# Patient Record
Sex: Female | Born: 1950
Health system: Southern US, Community
[De-identification: ages and names within clinical notes are randomized; demographics above are authoritative.]

## PROBLEM LIST (undated history)

## (undated) DIAGNOSIS — T8859XA Other complications of anesthesia, initial encounter: Secondary | ICD-10-CM

## (undated) DIAGNOSIS — Z923 Personal history of irradiation: Secondary | ICD-10-CM

## (undated) DIAGNOSIS — E7800 Pure hypercholesterolemia, unspecified: Secondary | ICD-10-CM

## (undated) DIAGNOSIS — K589 Irritable bowel syndrome without diarrhea: Secondary | ICD-10-CM

## (undated) DIAGNOSIS — M8580 Other specified disorders of bone density and structure, unspecified site: Secondary | ICD-10-CM

## (undated) DIAGNOSIS — Z9889 Other specified postprocedural states: Secondary | ICD-10-CM

## (undated) DIAGNOSIS — I10 Essential (primary) hypertension: Secondary | ICD-10-CM

## (undated) DIAGNOSIS — R0600 Dyspnea, unspecified: Secondary | ICD-10-CM

## (undated) DIAGNOSIS — S92919A Unspecified fracture of unspecified toe(s), initial encounter for closed fracture: Secondary | ICD-10-CM

## (undated) DIAGNOSIS — G709 Myoneural disorder, unspecified: Secondary | ICD-10-CM

## (undated) DIAGNOSIS — K219 Gastro-esophageal reflux disease without esophagitis: Secondary | ICD-10-CM

## (undated) DIAGNOSIS — M810 Age-related osteoporosis without current pathological fracture: Secondary | ICD-10-CM

## (undated) DIAGNOSIS — Z932 Ileostomy status: Secondary | ICD-10-CM

## (undated) DIAGNOSIS — D649 Anemia, unspecified: Secondary | ICD-10-CM

## (undated) DIAGNOSIS — R112 Nausea with vomiting, unspecified: Secondary | ICD-10-CM

## (undated) HISTORY — DX: Essential (primary) hypertension: I10

## (undated) HISTORY — DX: Age-related osteoporosis without current pathological fracture: M81.0

## (undated) HISTORY — DX: Other specified disorders of bone density and structure, unspecified site: M85.80

## (undated) HISTORY — DX: Irritable bowel syndrome, unspecified: K58.9

## (undated) HISTORY — DX: Pure hypercholesterolemia, unspecified: E78.00

## (undated) HISTORY — DX: Personal history of irradiation: Z92.3

## (undated) HISTORY — PX: TONSILLECTOMY: SUR1361

---

## 1988-04-11 HISTORY — PX: TUBAL LIGATION: SHX77

## 1993-04-11 HISTORY — PX: OTHER SURGICAL HISTORY: SHX169

## 2004-08-09 LAB — CONVERTED CEMR LAB

## 2006-01-11 ENCOUNTER — Encounter: Payer: Self-pay | Admitting: Family Medicine

## 2006-01-11 LAB — CONVERTED CEMR LAB
Cholesterol: 255 mg/dL
HDL: 98 mg/dL
Triglycerides: 64 mg/dL

## 2007-05-21 ENCOUNTER — Ambulatory Visit: Payer: Self-pay | Admitting: Family Medicine

## 2007-05-21 ENCOUNTER — Encounter: Admission: RE | Admit: 2007-05-21 | Discharge: 2007-05-21 | Payer: Self-pay | Admitting: Family Medicine

## 2007-05-21 DIAGNOSIS — M79609 Pain in unspecified limb: Secondary | ICD-10-CM

## 2007-05-21 DIAGNOSIS — K589 Irritable bowel syndrome without diarrhea: Secondary | ICD-10-CM

## 2007-05-21 DIAGNOSIS — E7800 Pure hypercholesterolemia, unspecified: Secondary | ICD-10-CM

## 2007-05-22 DIAGNOSIS — M949 Disorder of cartilage, unspecified: Secondary | ICD-10-CM

## 2007-05-22 DIAGNOSIS — M899 Disorder of bone, unspecified: Secondary | ICD-10-CM | POA: Insufficient documentation

## 2007-05-28 ENCOUNTER — Encounter: Payer: Self-pay | Admitting: Family Medicine

## 2007-06-04 ENCOUNTER — Encounter: Payer: Self-pay | Admitting: Family Medicine

## 2007-06-04 ENCOUNTER — Encounter: Admission: RE | Admit: 2007-06-04 | Discharge: 2007-06-04 | Payer: Self-pay | Admitting: Family Medicine

## 2007-06-05 ENCOUNTER — Encounter: Payer: Self-pay | Admitting: Family Medicine

## 2007-06-05 LAB — CONVERTED CEMR LAB
ALT: 15 units/L (ref 0–35)
AST: 18 units/L (ref 0–37)
CO2: 26 meq/L (ref 19–32)
Cholesterol: 269 mg/dL — ABNORMAL HIGH (ref 0–200)
Creatinine, Ser: 0.75 mg/dL (ref 0.40–1.20)
LDL Goal: 160 mg/dL
Total Bilirubin: 0.7 mg/dL (ref 0.3–1.2)
Total CHOL/HDL Ratio: 2.6
VLDL: 10 mg/dL (ref 0–40)

## 2007-06-07 ENCOUNTER — Telehealth: Payer: Self-pay | Admitting: Family Medicine

## 2007-06-12 ENCOUNTER — Encounter: Payer: Self-pay | Admitting: Family Medicine

## 2007-10-03 ENCOUNTER — Ambulatory Visit: Payer: Self-pay | Admitting: Obstetrics & Gynecology

## 2007-10-03 ENCOUNTER — Encounter: Payer: Self-pay | Admitting: Obstetrics & Gynecology

## 2007-10-04 ENCOUNTER — Encounter: Payer: Self-pay | Admitting: Family Medicine

## 2007-10-24 ENCOUNTER — Encounter: Payer: Self-pay | Admitting: Family Medicine

## 2007-11-13 DIAGNOSIS — R748 Abnormal levels of other serum enzymes: Secondary | ICD-10-CM

## 2007-11-13 LAB — CONVERTED CEMR LAB
Albumin: 4.8 g/dL (ref 3.5–5.2)
Alkaline Phosphatase: 54 units/L (ref 39–117)
HDL: 118 mg/dL (ref >39)
LDL Cholesterol: 101 mg/dL — ABNORMAL HIGH (ref 0–99)
Total Bilirubin: 0.9 mg/dL (ref 0.3–1.2)
VLDL: 23 mg/dL (ref 0–40)

## 2007-11-14 ENCOUNTER — Encounter: Payer: Self-pay | Admitting: Family Medicine

## 2007-11-15 LAB — CONVERTED CEMR LAB
Alkaline Phosphatase: 53 units/L (ref 39–117)
Bilirubin, Direct: 0.1 mg/dL (ref 0.0–0.3)
Indirect Bilirubin: 0.3 mg/dL (ref 0.0–0.9)
Total Bilirubin: 0.4 mg/dL (ref 0.3–1.2)
Total Protein: 6.8 g/dL (ref 6.0–8.3)

## 2007-12-24 ENCOUNTER — Encounter: Admission: RE | Admit: 2007-12-24 | Discharge: 2007-12-24 | Payer: Self-pay | Admitting: Obstetrics & Gynecology

## 2008-04-22 ENCOUNTER — Ambulatory Visit: Payer: Self-pay | Admitting: Family Medicine

## 2008-04-22 DIAGNOSIS — D239 Other benign neoplasm of skin, unspecified: Secondary | ICD-10-CM | POA: Insufficient documentation

## 2008-04-29 ENCOUNTER — Telehealth: Payer: Self-pay | Admitting: Family Medicine

## 2008-09-01 ENCOUNTER — Ambulatory Visit: Payer: Self-pay | Admitting: Family Medicine

## 2008-12-31 ENCOUNTER — Telehealth: Payer: Self-pay | Admitting: Family Medicine

## 2009-01-06 ENCOUNTER — Encounter: Admission: RE | Admit: 2009-01-06 | Discharge: 2009-01-06 | Payer: Self-pay | Admitting: Family Medicine

## 2009-01-15 ENCOUNTER — Ambulatory Visit: Payer: Self-pay | Admitting: Family Medicine

## 2009-08-12 ENCOUNTER — Ambulatory Visit: Payer: Self-pay | Admitting: Family Medicine

## 2009-08-13 ENCOUNTER — Encounter: Payer: Self-pay | Admitting: Family Medicine

## 2009-08-13 LAB — CONVERTED CEMR LAB
Albumin: 4.8 g/dL (ref 3.5–5.2)
BUN: 25 mg/dL — ABNORMAL HIGH (ref 6–23)
CO2: 23 meq/L (ref 19–32)
Calcium: 9.6 mg/dL (ref 8.4–10.5)
Chloride: 105 meq/L (ref 96–112)
Glucose, Bld: 95 mg/dL (ref 70–99)
HDL: 98 mg/dL (ref >39)
Potassium: 4.3 meq/L (ref 3.5–5.3)
Total Protein: 6.7 g/dL (ref 6.0–8.3)
Triglycerides: 63 mg/dL (ref <150)

## 2009-08-25 ENCOUNTER — Encounter: Payer: Self-pay | Admitting: Cardiology

## 2009-08-25 ENCOUNTER — Ambulatory Visit: Payer: Self-pay | Admitting: Cardiology

## 2009-08-25 DIAGNOSIS — R002 Palpitations: Secondary | ICD-10-CM

## 2009-08-25 DIAGNOSIS — R001 Bradycardia, unspecified: Secondary | ICD-10-CM

## 2009-08-25 DIAGNOSIS — R0602 Shortness of breath: Secondary | ICD-10-CM | POA: Insufficient documentation

## 2009-09-24 ENCOUNTER — Ambulatory Visit: Payer: Self-pay | Admitting: Family Medicine

## 2009-09-24 DIAGNOSIS — J309 Allergic rhinitis, unspecified: Secondary | ICD-10-CM | POA: Insufficient documentation

## 2009-09-28 ENCOUNTER — Telehealth: Payer: Self-pay | Admitting: Family Medicine

## 2009-09-30 ENCOUNTER — Ambulatory Visit: Payer: Self-pay

## 2009-09-30 ENCOUNTER — Encounter: Payer: Self-pay | Admitting: Cardiology

## 2009-09-30 ENCOUNTER — Ambulatory Visit (HOSPITAL_COMMUNITY): Admission: RE | Admit: 2009-09-30 | Discharge: 2009-09-30 | Payer: Self-pay | Admitting: Cardiology

## 2009-09-30 ENCOUNTER — Ambulatory Visit: Payer: Self-pay | Admitting: Cardiology

## 2009-12-15 ENCOUNTER — Ambulatory Visit: Payer: Self-pay | Admitting: Obstetrics & Gynecology

## 2009-12-24 ENCOUNTER — Ambulatory Visit: Payer: Self-pay | Admitting: Family Medicine

## 2010-01-19 ENCOUNTER — Encounter: Admission: RE | Admit: 2010-01-19 | Discharge: 2010-01-19 | Payer: Self-pay | Admitting: Obstetrics & Gynecology

## 2010-01-28 ENCOUNTER — Ambulatory Visit: Payer: Self-pay | Admitting: Family Medicine

## 2010-05-11 NOTE — Assessment & Plan Note (Signed)
Summary: shingles vaccine  Nurse Visit   Allergies: 1)  ! Lescol  Immunizations Administered:  Zostavax # 1:    Vaccine Type: Zostavax    Site: left deltoid    Mfr: Merck    Dose: 0.5 ml    Route: IM    Given by: Sue Lush McCrimmon CMA, (AAMA)    Exp. Date: 11/13/2010    Lot #: [id number]    VIS given: 01/21/05 given December 24, 2009.  Orders Added: 1)  Zoster (Shingles) Vaccine Live [90736] 2)  Admin 1st Vaccine [id number]

## 2010-05-11 NOTE — Progress Notes (Signed)
Summary: Needs Nasonex rx  Phone Note Call from Patient   Summary of Call: pt given sample of the Nasonex last week at [facility] and told to call if working. It is and would [facility] Initial call taken by: Kathlene November,  September 28, 2009 9:42 AM    New/Updated Medications: NASONEX 50 MCG/ACT SUSP (MOMETASONE FUROATE) 2 sprays per nostril daily Prescriptions: NASONEX 50 MCG/ACT SUSP (MOMETASONE FUROATE) 2 sprays per nostril daily  #1 bottle x 2   Entered and Authorized by:   Seymour Bars DO   Signed by:   Seymour Bars DO on 09/28/2009   Method used:   [facility] to        [phone][facility] (retail)       [address]       [place], [place]  [address]       Ph: [phone] or [phone]       Fax: [phone]   RxID:   [id number]

## 2010-05-11 NOTE — Assessment & Plan Note (Signed)
Summary: SINUS INF//VGJ   Vital Signs:  Patient profile:   60 year old female Height:      64 inches Weight:      129 pounds BMI:     22.22 O2 Sat:      100 % on Room air Temp:     98.1 degrees F oral Pulse rate:   61 / minute BP sitting:   133 / 75  (left arm) Cuff size:   regular  Vitals Entered By: Payton Spark CMA (September 24, 2009 8:51 AM)  O2 Flow:  Room air CC: Sinus infection x 3 weeks. C/O sinus HA and pressure.   History of Present Illness: 60 yo woman here today for ? sinus infxn.  3 weeks ago had URI and sxs persist.  pain w/ moving head/leaning forward.  HA is intermittant.  no facial pain, + mild pressure over maxillary sinuses.  no fevers.  no ear pain.  only intermittant nasal congestion.  pt c/o 'nasal voice'.  + sick contacts  Current Medications (verified): 1)  Adult Aspirin Ec Low Strength 81 Mg  Tbec (Aspirin) .... Take One Tablet By Mouth Once A Day 2)  Simvastatin 40 Mg  Tabs (Simvastatin) .... Take 1 Tablet By Mouth Once A Day 3)  Fish Oil Maximum Strength 1200 Mg Caps (Omega-3 Fatty Acids) .... Take 2 Capsules Two Times A Day 4)  Calcium Carbonate-Vitamin D 600-400 Mg-Unit  Tabs (Calcium Carbonate-Vitamin D) .... Take 1 Tablet By Mouth Two Times A Day  Allergies (verified): 1)  ! Lescol  Past History:  Past Medical History: Last updated: 08/25/2009 HYPERCHOLESTEROLEMIA (ICD-272.0) OSTEOPENIA (ICD-733.90) IBS (ICD-564.1)  Review of Systems      See HPI  Physical Exam  General:  Well-developed,well-nourished,in no acute distress; alert,appropriate and cooperative throughout examination Head:  Normocephalic and atraumatic without obvious abnormalities. No apparent alopecia or balding.  no TTP over sinuses Eyes:  no injxn or inflammation Ears:  TMs retracted bilaterally Nose:  + congestion, turbinate edema Mouth:  Oral mucosa and oropharynx without lesions or exudates.  Teeth in good repair.  + PND Neck:  No deformities, masses, or tenderness  noted. Lungs:  Normal respiratory effort, chest expands symmetrically. Lungs are clear to auscultation, no crackles or wheezes. Heart:  Normal rate and regular rhythm. S1 and S2 normal without gallop, murmur, click, rub or other extra sounds.   Impression & Recommendations:  Problem # 1:  RHINITIS (ICD-477.9) Assessment New pt w/out evidence of sinus infxn on exam.  likely resolving URI w/ allergy component given congestion and turbinate edema.  continue OTC decongestant and start nasal steroid spray.  reviewed supportive care and red flags that should prompt return.  Pt expresses understanding and is in agreement w/ this plan.  Complete Medication List: 1)  Adult Aspirin Ec Low Strength 81 Mg Tbec (Aspirin) .... Take one tablet by mouth once a day 2)  Simvastatin 40 Mg Tabs (Simvastatin) .... Take 1 tablet by mouth once a day 3)  Fish Oil Maximum Strength 1200 Mg Caps (Omega-3 fatty acids) .... Take 2 capsules two times a day 4)  Calcium Carbonate-vitamin D 600-400 Mg-unit Tabs (Calcium carbonate-vitamin d) .... Take 1 tablet by mouth two times a day  Patient Instructions: 1)  Follow up as needed 2)  This appears to be a viral/allergy combo and should continue to improve 3)  Use Advil Cold and Sinus as needed (like you have been) but no more than 3-5 days 4)  Tylenol/Ibuprofen as needed  for headache 5)  Use the nasal spray- 2 sprays each nostril daily while not feeling well 6)  Call with any questions or concerns 7)  Have a great trip!

## 2010-05-11 NOTE — Assessment & Plan Note (Signed)
Summary: CPE   Vital Signs:  Patient profile:   60 year old female Height:      64 inches Weight:      129 pounds BMI:     22.22 Pulse rate:   59 / minute BP sitting:   124 / 73  (left arm) Cuff size:   regular  Vitals Entered By: Kathlene November (Aug 12, 2009 9:20 AM) CC: CPE no pap   CC:  CPE no pap.  Current Medications (verified): 1)  Adult Aspirin Ec Low Strength 81 Mg  Tbec (Aspirin) .... Take One Tablet By Mouth Once A Day 2)  Simvastatin 40 Mg  Tabs (Simvastatin) .... Take 1 Tablet By Mouth Once A Day  Allergies (verified): 1)  ! Lescol  Comments:  Nurse/Medical Assistant: The patient's medications and allergies were reviewed with the patient and were updated in the Medication and Allergy Lists. Kathlene November (Aug 12, 2009 9:20 AM)  Past History:  Past Medical History: Last updated: 05/21/2007 Was on Selnorm for IBS  Past Surgical History: Last updated: 05/21/2007 Lump removed 1995  Family History: Last updated: 08/12/2009 cousin with BrCa Fathe wiht MI, DM, Hi chol, HTN Mother with stroke, hi chol, HTN, pacemaker.   Social History: Last updated: 08/12/2009 Unemployed.  [place].  Widowed to [employer] with adult chld.  Takes care of her father who lives in the home.   Former Smoker - Quit 1988 Alcohol use-yes Drug use-no Regular exercise-yes  Family History: cousin with BrCa F[place] wiht MI, DM, Hi chol, HTN Mother with stroke, hi chol, HTN, pacemaker.   Social History: Unemployed.  [employer] [place].  Widowed to [employer] with adult chld.  Takes care of her father who lives in the home.   Former Smoker - Quit 1988 Alcohol use-yes Drug use-no Regular exercise-yes  Review of Systems  The patient denies anorexia, fever, weight loss, weight gain, vision loss, decreased hearing, hoarseness, chest pain, syncope, dyspnea on exertion, peripheral edema, prolonged cough, headaches, hemoptysis, abdominal pain, melena, hematochezia, severe  indigestion/heartburn, hematuria, incontinence, genital sores, muscle weakness, suspicious skin lesions, transient blindness, difficulty walking, depression, unusual weight change, abnormal bleeding, enlarged lymph nodes, and breast masses.    Physical Exam  General:  Well-developed,well-nourished,in no acute distress; alert,appropriate and cooperative throughout examination Head:  Normocephalic and atraumatic without obvious abnormalities. No apparent alopecia or balding. Eyes:  No corneal or conjunctival inflammation noted. EOMI. Perrla.  Ears:  External ear exam shows no significant lesions or deformities.  Otoscopic examination reveals clear canals, tympanic membranes are intact bilaterally without bulging, retraction, inflammation or discharge. Hearing is grossly normal bilaterally. Nose:  External nasal examination shows no deformity or inflammation. Nasal mucosa are pink and moist without lesions or exudates. Mouth:  Oral mucosa and oropharynx without lesions or exudates.  Teeth in good repair. Neck:  No deformities, masses, or tenderness noted. Chest Wall:  No deformities, masses, or tenderness noted. Breasts:  No mass, nodules, thickening, tenderness, bulging, retraction, inflamation, nipple discharge or skin changes noted.   Lungs:  Normal respiratory effort, chest expands symmetrically. Lungs are clear to auscultation, no crackles or wheezes. Heart:  Normal rate and regular rhythm. S1 and S2 normal without gallop, murmur, click, rub or other extra sounds. Abdomen:  Bowel sounds positive,abdomen soft and non-tender without masses, organomegaly or hernias noted. Msk:  No deformity or scoliosis noted of thoracic or lumbar spine.   Pulses:  R and L carotid,radial,dorsalis pedis and posterior tibial pulses are full and  equal bilaterally Extremities:  No clubbing, cyanosis, edema, or deformity noted with normal full range of motion of all joints.   Neurologic:  No cranial nerve deficits  noted. Station and gait are normal. Plantar reflexes are down-going bilaterally. DTRs are symmetrical throughout. Sensory, motor and coordinative functions appear intact. Skin:  no rashes.   Cervical Nodes:  No lymphadenopathy noted Axillary Nodes:  No palpable lymphadenopathy Psych:  Cognition and judgment appear intact. Alert and cooperative with normal attention span and concentration. No apparent delusions, illusions, hallucinations   Impression & Recommendations:  Problem # 1:  ROUTINE GYNECOLOGICAL EXAMINATION (ICD-V72.31)  Examis normal. Mammo is up to date.  Pt prefers to do stool cards for colon Ca screening Encourage daily calcium and vitamin D in her diet.   Due for screening labs.  Due for her pap this year.  EKG with rate of 53 (bradycardic) with NSR. No acute changes.  Orders: T-Comprehensive Metabolic Panel [phone] T-Lipid Profile [phone]) T-TSH [phone]) Cardiology Referral (Cardiology) EKG w/ Interpretation (93000)  Complete Medication List: 1)  Adult Aspirin Ec Low Strength 81 Mg Tbec (Aspirin) .... Take one tablet by mouth once a day 2)  Simvastatin 40 Mg Tabs (Simvastatin) .... Take 1 tablet by mouth once a day  Patient Instructions: 1)  Keep up the good work on exercising!  2)  Take calcium +vitamin D daily.  3)  Can drop off your stool cards anytime.  4)  We will call you with your lab results in a couple of days.  5)  We will call you with your cardiology referral with Dr. Jens Som.  6)  Also think about the shingles vaccines. We should get our doses in in July.  7)  It is important to use your inhaler properly. Use a spacer, take slow deep breaths and hold them. Rinse your mouth after using.   Flex Sig Next Due:  Refused Colonoscopy Next Due:  Refused Last PAP:  Unknown (08/09/2004 2:18:30 PM) PAP Result Date:  04/11/2006 PAP Result:  normal PAP Next Due:  3 yr  Appended Document: CPE  Laboratory Results  Date/Time Received:  08/18/2009 Date/Time Reported: 08/18/2009  Stool - Occult Blood Hemmoccult #1: negative Hemoccult #2: negative Hemoccult #3: negative

## 2010-05-11 NOTE — Assessment & Plan Note (Signed)
Summary: [facility]   Visit Type:  Initial Consult Primary Provider:  Linford Arnold, C  CC:  palpitations.  History of Present Illness: 60 year old female for evaluation of palpitations, dyspnea and bradycardia. The patient has no prior cardiac history. She does have occasional palpitations. They are described as a brief flutter and are not sustained. There is no associated presyncope or syncope. She also occasionally has dyspnea with more extreme activities which is relieved with rest. There is no associated chest pain. There is no orthopnea, PND or pedal edema. She does not have exertional chest pain. She does have a family history of coronary disease and was concerned; we were therefore asked to further evaluate.  Current Medications (verified): 1)  Adult Aspirin Ec Low Strength 81 Mg  Tbec (Aspirin) .... Take One Tablet By Mouth Once A Day 2)  Simvastatin 40 Mg  Tabs (Simvastatin) .... Take 1 Tablet By Mouth Once A Day 3)  Fish Oil Maximum Strength 1200 Mg Caps (Omega-3 Fatty Acids) .... Take 2 Capsules Two Times A Day 4)  Calcium Carbonate-Vitamin D 600-400 Mg-Unit  Tabs (Calcium Carbonate-Vitamin D) .... Take 1 Tablet By Mouth Two Times A Day  Allergies: 1)  ! Lescol  Past History:  Past Medical History: HYPERCHOLESTEROLEMIA (ICD-272.0) OSTEOPENIA (ICD-733.90) IBS (ICD-564.1)  Past Surgical History: Lump removed 1995 Tonsillectomy  Family History: Reviewed history from 08/12/2009 and no changes required. cousin with BrCa Father with MI at age 41; DM, Hi chol, HTN Mother with stroke, hi chol, HTN, pacemaker.   Social History: Reviewed history from 08/12/2009 and no changes required. Retired.  [employer].  Widowed to [employer] with adult chld. Takes care of her father who lives in the home.   Former Smoker - Quit 1988 Alcohol use-yes Drug use-no Regular exercise-yes  Review of Systems       no fevers or chills, productive cough, hemoptysis, dysphasia,  odynophagia, melena, hematochezia, dysuria, hematuria, rash, seizure activity, orthopnea, PND, pedal edema, claudication. Remaining systems are negative.   Vital Signs:  Patient profile:   60 year old female Height:      64 inches Weight:      127.75 pounds BMI:     22.01 Pulse rate:   67 / minute Pulse rhythm:   regular Resp:     18 per minute BP sitting:   148 / 70  (right arm) Cuff size:   large  Vitals Entered By: Vikki Ports (Aug 25, 2009 2:15 PM)  Physical Exam  General:  Well developed/well nourished in NAD Skin warm/dry Patient not depressed No peripheral clubbing Back-normal HEENT-normal/normal eyelids Neck supple/normal carotid upstroke bilaterally; no bruits; no JVD; no thyromegaly chest - CTA/ normal expansion CV - RRR/normal S1 and S2; no murmurs, rubs or gallops;  PMI nondisplaced Abdomen -NT/ND, no HSM, no mass, + bowel sounds, no bruit 2+ femoral pulses, no bruits Ext-no edema, chords, 2+ DP Neuro-grossly nonfocal     EKG  Procedure date:  08/12/2009  Findings:      Sinus bradycardia at a rate of 53. Axis normal.  Impression & Recommendations:  Problem # 1:  DYSPNEA (ICD-786.05) Not volume overloaded on examination. Electrocardiogram normal. Given her risk factors we'll plan stress echocardiogram. Continue risk factor modification. Her updated medication list for this problem includes:    Adult Aspirin Ec Low Strength 81 Mg Tbec (Aspirin) .Marland Kitchen... Take one tablet by mouth once a day  Orders: Stress Echo (Stress Echo)  Problem # 2:  HYPERCHOLESTEROLEMIA (ICD-272.0) Continue statin; lipids and  liver followed by primary care. Her updated medication list for this problem includes:    Simvastatin 40 Mg Tabs (Simvastatin) .Marland Kitchen... Take 1 tablet by mouth once a day  Problem # 3:  IBS (ICD-564.1)  Problem # 4:  BRADYCARDIA (ICD-427.89) Asymptomatic; no further evaluation. Her updated medication list for this problem includes:    Adult Aspirin Ec Low  Strength 81 Mg Tbec (Aspirin) .Marland Kitchen... Take one tablet by mouth once a day  Problem # 5:  PALPITATIONS (ICD-785.1) Most likely due to PVCs based on description; not significantly bothersome; plan monitor in the future if symptoms worsen. Her updated medication list for this problem includes:    Adult Aspirin Ec Low Strength 81 Mg Tbec (Aspirin) .Marland Kitchen... Take one tablet by mouth once a day  Patient Instructions: 1)  Your physician recommends that you schedule a follow-up appointment in: AS NEEDED PENDING TEST RESULTS 2)  Your physician has requested that you have a stress echocardiogram. For further information please visit [email or web address]  Please follow instruction sheet as given.

## 2010-05-11 NOTE — Assessment & Plan Note (Signed)
Summary: flu shot   Nurse Visit   Allergies: 1)  ! Lescol  Immunizations Administered:  Influenza Vaccine # 1:    Vaccine Type: Fluvax 3+    Site: left deltoid    Mfr: GlaxoSmithKline    Dose: 0.5 ml    Route: IM    Given by: Sue Lush McCrimmon CMA, (AAMA)    Exp. Date: 10/09/2010    Lot #: [id number]    VIS given: 11/03/09 version given January 28, 2010.  Flu Vaccine Consent Questions:    Do you have a history of severe allergic reactions to this vaccine? no    Any prior history of allergic reactions to egg and/or gelatin? no    Do you have a sensitivity to the preservative Thimersol? no    Do you have a past history of Guillan-Barre Syndrome? no    Do you currently have an acute febrile illness? no    Have you ever had a severe reaction to latex? no    Vaccine information given and explained to patient? no    Are you currently pregnant? no  Orders Added: 1)  Flu Vaccine 27yrs + [90658] 2)  Admin 1st Vaccine [[id number]]

## 2010-08-03 ENCOUNTER — Other Ambulatory Visit: Payer: Self-pay | Admitting: Family Medicine

## 2010-08-24 NOTE — Assessment & Plan Note (Signed)
NAME:  Maria Romero, Maria Romero NO.:  [id number]   MEDICAL RECORD NO.:  [id number]          PATIENT TYPE:  POB   LOCATION:  [facility] [place]         FACILITY[facility]  [facility]   PHYSICIAN:  Elsie Lincoln, MD      DATE OF BIRTH:  June 11, 1950   DATE OF SERVICE:                                  CLINIC NOTE   HISTORY OF PRESENT ILLNESS:  The patient is a 60 year old G1, P56 female  who has been menopausal since age 51, who presents for yearly exam.  It  has been 4-5 years until last Pap smear.  She recently moved to the area  from [place].  The patient has had no postmenopausal bleeding since  age 21.  She had an occasional hot flash, but not bothersome.  She has  no complaints today.   PAST MEDICAL HISTORY:  Increased cholesterol.   PAST SURGICAL HISTORY:  Bilateral tubal ligation.   OB HISTORY:  NSVD x1.   GYN HISTORY:  Abnormal Pap once in her 30s, and once in her 40s, and all  that was required was repeat surveillance.  She never had a colposcopy,  freezing or burning of her cervix.  She had a right cyst many years ago,  that was benign and did not have any surgery.   HEALTH MAINTENANCE:  The patient is due for a mammogram in early  September, and we will order this.  The patient has never had a  colonoscopy.  I do believe she needs one.  We gave her 3 stool kits to  send home.  She does a power walk and takes some calcium, but do not  think this is enough.  We gave her a worksheet to fill out to see if she  needs to increase her calcium intake.   FAMILY HISTORY:  Positive for diabetes, heart attack, high blood  pressure.  There is no familial cancers.   SOCIAL HISTORY:  The patient drinks 4 alcoholic beverages a week and  drinks 2 cups of coffee a day.  No drugs or history of abuse.  Systemic  review is positive for muscle aches that she attributes to picking up  her 24-year-old granddaughter.   MEDICATIONS:  1. Zocor.  2. Lovaza.  3. Caltrate 600 D.  4. Bayer  Aspirin.   ALLERGIES:  None.   PHYSICAL EXAMINATION:  VITALS:  Pulse 64, blood pressure 100/60, weight  133, height 63-3/4 inches.  GENERAL:  Well nourished, well developed, in no apparent distress.  HEENT:  Normocephalic, atraumatic.  Good dentition.  The patient does  need to see her dentist as she has not had a checkup in several years.  NECK:  Thyroid, no masses.  LUNGS:  Clear to auscultation bilaterally.  HEART:  Regular rate and rhythm.  BREASTS:  Symmetric.  No dimpling.  No discharge.  No lymphadenopathy.  No masses.  ABDOMEN:  Soft, nontender, and nondistended.  No rebound.  No guarding.  No organomegaly.  No hernia.  Well-healed incision at the umbilicus from  tubal.  GENITALIA:  Tanner V.  Vagina pink.  Normal rugae.  Cervix closed and  nontender.  Uterus anteverted and nontender.  Adnexa, no  masses and  nontender.  Rectovaginal, no masses and nontender.  Stool looked  positive for blood.  Stool kits were sent home.  The patient was  encouraged to get colonoscopy.  EXTREMITIES:  Nontender.  No edema.   ASSESSMENT AND PLAN:  A 61 year old female with yearly exam:  1. Pap smear.  2. Mammogram ordered for September.  3. The patient needs a colonoscopy, but only agrees to the stool cards      at this time.  4. Return to clinic in a year for a repeat Pap smear.           ______________________________  Elsie Lincoln, MD     KL/MEDQ  D:  10/03/2007  T:  10/04/2007  Job:  [id number]

## 2010-08-24 NOTE — Assessment & Plan Note (Signed)
Maria Romero, Maria Romero NO.:  [id number]   MEDICAL RECORD NO.:  [id number]          PATIENT TYPE:  POB   LOCATION:  [facility] [place]         FACILITY:  [facility]   PHYSICIAN:  Allie Bossier, MD        DATE OF BIRTH:  04-13-1950   DATE OF SERVICE:  12/15/2009                                  CLINIC NOTE   Ms. Scharnhorst is a 60 year old married white G1, P1.  She has a 34-year-  old daughter and a 5-year granddaughter.  She comes in here for her  annual exam.  Her complaints are decreased libido and vaginal dryness.  She is not sexually active currently, but may become sexually active in  the future.   PAST MEDICAL HISTORY:  Elevated cholesterol.  She sees Dr. Linford Arnold for  this.   REVIEW OF SYSTEMS:  She is a retired Research scientist (medical).  The remainder of  review of systems questions are negative.  Her mammogram is due next  month and she will call and schedule it.  She gets annual Hemoccult  testing with Dr. Linford Arnold.   FAMILY HISTORY:  First cousin had breast cancer.  No family history of  GYN or colon cancers.   PAST SURGICAL HISTORY:  Tonsillectomy and tubal ligation.   SOCIAL HISTORY:  She quit smoking 20 years ago.  She has wine in the  evenings.   ALLERGIES:  No known drug allergies.  No latex allergies.   MEDICATIONS:  1. Zocor 40 mg daily.  2. Calcium with D daily.  3. Baby aspirin daily.  4. Fish oil daily.   PHYSICAL EXAMINATION:  GENERAL:  Well-nourished, well-hydrated very  pleasant white female.  VITAL SIGNS:  Height 5 feet 3-3/4 inches, weight 129, blood pressure  146/79, pulse 60.  HEENT:  Normal.  HEART:  Regular rate and rhythm.  LUNGS:  Clear to auscultation bilaterally.  BREASTS:  Normal.  No supraclavicular adenopathy.  ABDOMEN:  Benign.  No palpable hepatosplenomegaly.  EXTERNAL GENITALIA:  Marked atrophy.  No lesions.  There is 1 small  varicosity on her left labia majora.  Vagina atrophic.  Cervix atrophic.  Uterus; normal size and  shape, midplane, and nontender.  Adnexa  nontender.  No masses.   ASSESSMENT/PLAN:  1. Annual exam.  I have checked her Pap smear.  She will schedule her      mammogram next month.  2. Symptomatic vaginal atrophy.  I have discussed personal lubrication      versus prescription estrogen vaginal replacements.  I have given      her samples of the Estrace cream and a prescription for Vagifem      tablets to be used twice a week.  She was just to use the personal      lubrication, but she will have the prescription if she desires it.      She will come back in a year or sooner as necessary.  I recommended      self-breast and self-vulvar exams monthly.      Allie Bossier, MD     MCD/MEDQ  D:  12/15/2009  T:  12/16/2009  Job:  [id number]

## 2010-10-03 ENCOUNTER — Other Ambulatory Visit: Payer: Self-pay | Admitting: Family Medicine

## 2010-11-24 ENCOUNTER — Encounter: Payer: Self-pay | Admitting: Cardiology

## 2010-12-14 ENCOUNTER — Ambulatory Visit (INDEPENDENT_AMBULATORY_CARE_PROVIDER_SITE_OTHER): Payer: Self-pay | Admitting: Family Medicine

## 2010-12-14 DIAGNOSIS — Z23 Encounter for immunization: Secondary | ICD-10-CM

## 2010-12-14 NOTE — Progress Notes (Signed)
  Subjective:    Patient ID: Maria Romero, female    DOB: 05/05/1950, 60 y.o.   MRN: [id number]  HPI Here for flu shot   Review of Systems     Objective:   Physical Exam        Assessment & Plan:

## 2010-12-16 ENCOUNTER — Other Ambulatory Visit: Payer: Self-pay | Admitting: Family Medicine

## 2010-12-16 MED ORDER — SIMVASTATIN 40 MG PO TABS: 40.0000 mg | ORAL_TABLET | Freq: Every day | ORAL | Status: DC

## 2010-12-16 NOTE — Telephone Encounter (Signed)
Pt called for refill of her zocor 40mg  medication.  Pt has not had a CPE since 08-2009 or had chol labs since that time. Plan:  Pt informed will send a 30 day supply, and will need a CPE in next 30 days before any more med will be sent since this is second instruction to schedule.  Pt's father just had surgery and pt states she will call back to schedule once she gets her father situated. Jarvis Newcomer, LPN [facility]

## 2010-12-20 NOTE — Telephone Encounter (Signed)
Closed

## 2011-01-25 ENCOUNTER — Other Ambulatory Visit: Payer: Self-pay | Admitting: Obstetrics & Gynecology

## 2011-01-25 DIAGNOSIS — Z1231 Encounter for screening mammogram for malignant neoplasm of breast: Secondary | ICD-10-CM

## 2011-02-08 ENCOUNTER — Ambulatory Visit
Admission: RE | Admit: 2011-02-08 | Discharge: 2011-02-08 | Disposition: A | Discharge status: Home / Self Care | Payer: BC Managed Care – PPO | Source: Ambulatory Visit | Attending: Obstetrics & Gynecology | Admitting: Obstetrics & Gynecology

## 2011-02-08 DIAGNOSIS — Z1231 Encounter for screening mammogram for malignant neoplasm of breast: Secondary | ICD-10-CM

## 2011-02-14 ENCOUNTER — Other Ambulatory Visit: Payer: Self-pay | Admitting: Family Medicine

## 2011-03-13 ENCOUNTER — Encounter: Payer: Self-pay | Admitting: *Deleted

## 2011-03-13 ENCOUNTER — Emergency Department
Admit: 2011-03-13 | Discharge: 2011-03-13 | Disposition: A | Discharge status: Home / Self Care | Payer: BC Managed Care – PPO

## 2011-03-13 ENCOUNTER — Emergency Department (INDEPENDENT_AMBULATORY_CARE_PROVIDER_SITE_OTHER)
Admission: EM | Admit: 2011-03-13 | Discharge: 2011-03-13 | Disposition: A | Discharge status: Home / Self Care | Payer: BC Managed Care – PPO | Source: Home / Self Care | Attending: Emergency Medicine | Admitting: Emergency Medicine

## 2011-03-13 DIAGNOSIS — M25579 Pain in unspecified ankle and joints of unspecified foot: Secondary | ICD-10-CM

## 2011-03-13 DIAGNOSIS — S92909A Unspecified fracture of unspecified foot, initial encounter for closed fracture: Secondary | ICD-10-CM

## 2011-03-13 DIAGNOSIS — S93602A Unspecified sprain of left foot, initial encounter: Secondary | ICD-10-CM

## 2011-03-13 DIAGNOSIS — S92901A Unspecified fracture of right foot, initial encounter for closed fracture: Secondary | ICD-10-CM

## 2011-03-13 DIAGNOSIS — S93609A Unspecified sprain of unspecified foot, initial encounter: Secondary | ICD-10-CM

## 2011-03-13 NOTE — ED Provider Notes (Signed)
History     CSN: [id number] Arrival date & time: 03/13/2011 12:27 PM   None     Chief Complaint  Patient presents with  . Foot Injury    bilateral    (Consider location/radiation/quality/duration/timing/severity/associated sxs/prior treatment) HPI She was putting up Christmas lights last night and gallbladder. While she was coming down a ladder she fell off. She was unsure how high she was but she canceled about 3-4 feet. She is also unsure how she landed but afterwards both her feet hurt. No other parts her body part injured. She feels pain on the top of both feet and would like to have both an x-ray. No previous injuries noted. She has been using ice which has been helping.  Past Medical History  Diagnosis Date  . Hypercholesterolemia   . Osteopenia   . IBS (irritable bowel syndrome)     Past Surgical History  Procedure Date  . Lump removal 1995  . Tonsillectomy     Family History  Problem Relation Age of Onset  . Stroke Mother   . Hypertension Mother   . Hyperlipidemia Mother   . Heart attack Father 53  . Diabetes Father   . Hypertension Father   . Hyperlipidemia Father   . Breast cancer Cousin     History  Substance Use Topics  . Smoking status: Former Games developer  . Smokeless tobacco: Not on file   Comment: quit 1988  . Alcohol Use: Yes    OB History    Grav Para Term Preterm Abortions TAB SAB Ect Mult Living                  Review of Systems  Allergies  Fluvastatin sodium  Home Medications   Current Outpatient Rx  Name Route Sig Dispense Refill  . ASPIRIN 81 MG PO TBEC Oral Take 81 mg by mouth daily.      Marland Kitchen CALCIUM CARBONATE-VIT D-MIN 600-400 MG-UNIT PO TABS Oral Take by mouth 2 (two) times daily.      . MOMETASONE FUROATE 50 MCG/ACT NA SUSP Nasal Place 2 sprays into the nose daily.      Marland Kitchen FISH OIL MAXIMUM STRENGTH 1200 MG PO CAPS Oral Take 2 capsules by mouth 2 (two) times daily.      Marland Kitchen SIMVASTATIN 40 MG PO TABS  TAKE 1 TABLET (40 MG TOTAL) BY  MOUTH AT BEDTIME. 30 tablet 0    BP 159/84  Pulse 77  Temp(Src) 98.4 F (36.9 C) (Oral)  Resp 14  Ht 5\' 4"  (1.626 m)  Wt 134 lb (60.782 kg)  BMI 23.00 kg/m2  SpO2 98%  Physical Exam  Nursing note and vitals reviewed. Constitutional: She is oriented to person, place, and time. She appears well-developed and well-nourished.  HENT:  Head: Normocephalic and atraumatic.  Neck: Neck supple.  Cardiovascular: Regular rhythm and normal heart sounds.   Pulmonary/Chest: Effort normal and breath sounds normal. No respiratory distress.  Musculoskeletal:       R ankle/foot: FROM, +TTP at the ATFL and generally on the dorsum of the foot..   No TTP medial/lateral malleolus, navicular, base of 5th, calcaneus, Achilles, proximal fibula.  No swelling.  No ecchymoses.  Distal neurovascular status is intact.   L ankle/foot: FROM, +TTP very mildly at the ATFL and slightly distal to that area..   No TTP medial/lateral malleolus, navicular, base of 5th, calcaneus, Achilles, proximal fibula.  No swelling.  No ecchymoses.  Distal neurovascular status is intact.   Neurological: She is  alert and oriented to person, place, and time.  Skin: Skin is warm and dry.  Psychiatric: She has a normal mood and affect. Her speech is normal.    ED Course  Procedures (including critical care time)  Labs Reviewed - No data to display Dg Foot Complete Left  03/13/2011  *RADIOLOGY REPORT*  Clinical Data: Fall, bilateral foot pain.  LEFT FOOT - COMPLETE 3+ VIEW  Comparison: None  Findings: No acute bony abnormality.  Specifically, no fracture, subluxation, or dislocation.  Soft tissues are intact.  IMPRESSION: No acute bony abnormality.  Original Report Authenticated By: Cyndie Chime, M.D.   Dg Foot Complete Right  03/13/2011  *RADIOLOGY REPORT*  Clinical Data: Fall, bilateral foot pain.  RIGHT FOOT COMPLETE - 3+ VIEW  Comparison: Left foot done today.  Findings: There is a fracture through the lateral aspect of the  calcaneus, seen only on the frontal view.  Fracture fragment is mildly displaced.  No additional acute bony abnormality.  IMPRESSION: Fracture through the distal lateral aspect of the calcaneus.  Original Report Authenticated By: Cyndie Chime, M.D.     1. Pain, joint, ankle and foot       MDM  An X-ray was ordered and read by the radiologist as above. Encourage rest, ice, compression with ACE bandage and/or a brace, and elevation of injured body part.  She'll he has some ankle braces that she has been using which do help. She can continue to do that on the left foot. However, on the right foot I placed her in a short walking boot. I will also be referring her to orthopedics to have this lateral calcaneal  fracture followed.  Lily Kocher, MD 03/13/11 1343

## 2011-03-13 NOTE — ED Notes (Addendum)
Patient reports falling off of a ladder last night injuring both feet. She reports an old injury to her left foot.

## 2011-03-14 ENCOUNTER — Telehealth: Payer: Self-pay | Admitting: Emergency Medicine

## 2011-03-15 ENCOUNTER — Ambulatory Visit (INDEPENDENT_AMBULATORY_CARE_PROVIDER_SITE_OTHER): Payer: BC Managed Care – PPO | Admitting: Family Medicine

## 2011-03-15 VITALS — BP 142/90 | HR 72 | Wt 132.0 lb

## 2011-03-15 DIAGNOSIS — R030 Elevated blood-pressure reading, without diagnosis of hypertension: Secondary | ICD-10-CM

## 2011-03-15 DIAGNOSIS — I10 Essential (primary) hypertension: Secondary | ICD-10-CM

## 2011-03-15 NOTE — Progress Notes (Signed)
  Subjective:    Patient ID: Maria Romero, female    DOB: [birth year], 60 y.o.   MRN: [id number] BP check. Seen at [facility] over the weekend and was elevated. Pt been taking when goes to gym and readings are 138/82, 136/85.    HPI    Review of Systems     Objective:   Physical Exam        Assessment & Plan:  HTN- BP is better than it was in urgent care. Continue to monitor for one month. Recheck in one months. Make sure eating low sat diet. BP may have been up from pain.

## 2011-03-21 ENCOUNTER — Encounter: Payer: Self-pay | Admitting: *Deleted

## 2011-03-24 ENCOUNTER — Emergency Department: Admission: EM | Admit: 2011-03-24 | Discharge: 2011-03-24 | Discharge status: Absent without leave | Payer: Self-pay

## 2011-03-28 ENCOUNTER — Other Ambulatory Visit: Payer: Self-pay | Admitting: *Deleted

## 2011-03-28 ENCOUNTER — Encounter: Payer: Self-pay | Admitting: *Deleted

## 2011-03-28 DIAGNOSIS — N951 Menopausal and female climacteric states: Secondary | ICD-10-CM

## 2011-03-28 MED ORDER — ESTRADIOL 10 MCG VA TABS: 10.0000 ug | ORAL_TABLET | VAGINAL | Status: DC

## 2011-03-28 NOTE — Telephone Encounter (Signed)
Pt is in a caswt for 4 weeks and cannot come into the office.  RF for Vagifem granted only once until her appointment.

## 2011-04-14 ENCOUNTER — Other Ambulatory Visit: Payer: Self-pay | Admitting: Sports Medicine

## 2011-04-14 ENCOUNTER — Ambulatory Visit
Admission: RE | Admit: 2011-04-14 | Discharge: 2011-04-14 | Disposition: A | Discharge status: Home / Self Care | Payer: BC Managed Care – PPO | Source: Ambulatory Visit | Attending: Sports Medicine | Admitting: Sports Medicine

## 2011-04-14 ENCOUNTER — Other Ambulatory Visit: Payer: Self-pay | Admitting: Obstetrics & Gynecology

## 2011-04-14 DIAGNOSIS — M79671 Pain in right foot: Secondary | ICD-10-CM

## 2011-04-14 DIAGNOSIS — M25572 Pain in left ankle and joints of left foot: Secondary | ICD-10-CM

## 2011-05-10 ENCOUNTER — Ambulatory Visit (INDEPENDENT_AMBULATORY_CARE_PROVIDER_SITE_OTHER): Payer: BC Managed Care – PPO | Admitting: Obstetrics & Gynecology

## 2011-05-10 ENCOUNTER — Encounter: Payer: Self-pay | Admitting: Obstetrics & Gynecology

## 2011-05-10 DIAGNOSIS — M949 Disorder of cartilage, unspecified: Secondary | ICD-10-CM

## 2011-05-10 DIAGNOSIS — Z1272 Encounter for screening for malignant neoplasm of vagina: Secondary | ICD-10-CM

## 2011-05-10 DIAGNOSIS — Z0000 Encounter for general adult medical examination without abnormal findings: Secondary | ICD-10-CM

## 2011-05-10 DIAGNOSIS — M899 Disorder of bone, unspecified: Secondary | ICD-10-CM

## 2011-05-10 MED ORDER — ESTRADIOL 10 MCG VA TABS: ORAL_TABLET | VAGINAL | Status: DC

## 2011-05-10 NOTE — Progress Notes (Signed)
Subjective:    Maria Romero is a 61 y.o. female who presents for an annual exam. The patient has no complaints today. She would like a refill on Vagifem. The patient is sexually active. GYN screening history: last pap: was normal. The patient wears seatbelts: yes. The patient participates in regular exercise: yes. Has the patient ever been transfused or tattooed?: yes. (tattoo)The patient reports that there is not domestic violence in her life.   Menstrual History: OB History    Grav Para Term Preterm Abortions TAB SAB Ect Mult Living   1 1 1       1       Menarche age: 52 No LMP recorded. Patient is postmenopausal.    The following portions of the patient's history were reviewed and updated as appropriate: allergies, current medications, past family history, past medical history, past social history, past surgical history and problem list.  Review of Systems A comprehensive review of systems was negative. She is widowed but has a "friend" with whom she has sex occasionally, denies dysparunia, mammogram recently normal, DEXA due, has already had her flu shot this season.   Objective:    BP 127/76  Pulse 71  Temp 97.4 F (36.3 C)  Wt 137 lb (62.143 kg)  General Appearance:    Alert, cooperative, no distress, appears stated age  Head:    Normocephalic, without obvious abnormality, atraumatic  Eyes:    PERRL, conjunctiva/corneas clear, EOM's intact, fundi    benign, both eyes  Ears:    Normal TM's and external ear canals, both ears  Nose:   Nares normal, septum midline, mucosa normal, no drainage    or sinus tenderness  Throat:   Lips, mucosa, and tongue normal; teeth and gums normal  Neck:   Supple, symmetrical, trachea midline, no adenopathy;    thyroid:  no enlargement/tenderness/nodules; no carotid   bruit or JVD  Back:     Symmetric, no curvature, ROM normal, no CVA tenderness  Lungs:     Clear to auscultation bilaterally, respirations unlabored  Chest Wall:    No  tenderness or deformity   Heart:    Regular rate and rhythm, S1 and S2 normal, no murmur, rub   or gallop  Breast Exam:    No tenderness, masses, or nipple abnormality  Abdomen:     Soft, non-tender, bowel sounds active all four quadrants,    no masses, no organomegaly  Genitalia:    Normal female without lesion, discharge or tenderness, marked atrophy, normal cervix, NSSA, NT, no adnexal masses     Extremities:   Extremities normal, atraumatic, no cyanosis or edema  Pulses:   2+ and symmetric all extremities  Skin:   Skin color, texture, turgor normal, no rashes or lesions  Lymph nodes:   Cervical, supraclavicular, and axillary nodes normal  Neurologic:   CNII-XII intact, normal strength, sensation and reflexes    throughout  .    Assessment:    Healthy female exam.    Plan:     Pap smear. ( per her request) I refilled her Vagifem

## 2011-05-12 ENCOUNTER — Ambulatory Visit
Admission: RE | Admit: 2011-05-12 | Discharge: 2011-05-12 | Disposition: A | Discharge status: Home / Self Care | Payer: BC Managed Care – PPO | Source: Ambulatory Visit | Attending: Sports Medicine | Admitting: Sports Medicine

## 2011-05-12 ENCOUNTER — Other Ambulatory Visit: Payer: Self-pay | Admitting: Sports Medicine

## 2011-05-12 DIAGNOSIS — S92009A Unspecified fracture of unspecified calcaneus, initial encounter for closed fracture: Secondary | ICD-10-CM

## 2011-05-17 ENCOUNTER — Ambulatory Visit
Admission: RE | Admit: 2011-05-17 | Discharge: 2011-05-17 | Disposition: A | Discharge status: Home / Self Care | Payer: BC Managed Care – PPO | Source: Ambulatory Visit | Attending: Obstetrics & Gynecology | Admitting: Obstetrics & Gynecology

## 2011-05-17 DIAGNOSIS — M899 Disorder of bone, unspecified: Secondary | ICD-10-CM

## 2011-05-23 ENCOUNTER — Encounter: Payer: Self-pay | Admitting: Family Medicine

## 2011-05-23 ENCOUNTER — Ambulatory Visit (INDEPENDENT_AMBULATORY_CARE_PROVIDER_SITE_OTHER): Payer: BC Managed Care – PPO | Admitting: Family Medicine

## 2011-05-23 VITALS — BP 106/70 | HR 68 | Ht 64.0 in | Wt 135.0 lb

## 2011-05-23 DIAGNOSIS — E785 Hyperlipidemia, unspecified: Secondary | ICD-10-CM

## 2011-05-23 DIAGNOSIS — Z0000 Encounter for general adult medical examination without abnormal findings: Secondary | ICD-10-CM

## 2011-05-23 DIAGNOSIS — Z1211 Encounter for screening for malignant neoplasm of colon: Secondary | ICD-10-CM

## 2011-05-23 DIAGNOSIS — M8580 Other specified disorders of bone density and structure, unspecified site: Secondary | ICD-10-CM

## 2011-05-23 MED ORDER — SIMVASTATIN 40 MG PO TABS: 40.0000 mg | ORAL_TABLET | Freq: Every day | ORAL | Status: DC

## 2011-05-23 NOTE — Progress Notes (Signed)
  Subjective:     Maria Romero is a 61 y.o. female and is here for a comprehensive physical exam. The patient reports no problems.  History   Social History  . Marital Status: Widowed    Spouse Name: N/A    Number of Children: N/A  . Years of Education: N/A   Occupational History  . retired Designer, industrial/product    Social History Main Topics  . Smoking status: Former Smoker    Types: Cigarettes    Quit date: 03/20/1990  . Smokeless tobacco: Never Used   Comment: quit 1988  . Alcohol Use: Yes     Wine in the evenings  . Drug Use: No  . Sexually Active: No   Other Topics Concern  . Not on file   Social History Narrative   Regularly exercise.    Health Maintenance  Topic Date Due  . Influenza Vaccine  01/10/2012  . Mammogram  02/07/2013  . Pap Smear  05/09/2014  . Tetanus/tdap  08/09/2016  . Colonoscopy  05/22/2021  . Zostavax  Completed    The following portions of the patient's history were reviewed and updated as appropriate: allergies, current medications, past family history, past medical history, past social history, past surgical history and problem list.  Review of Systems A comprehensive review of systems was negative.   Objective:    BP 106/70  Pulse 68  Ht 5\' 4"  (1.626 m)  Wt 135 lb (61.236 kg)  BMI 23.17 kg/m2 General appearance: alert, cooperative and appears stated age Head: Normocephalic, without obvious abnormality, atraumatic Eyes: conj clear, EOMI, PEERLA Ears: normal TM's and external ear canals both ears Nose: Nares normal. Septum midline. Mucosa normal. No drainage or sinus tenderness. Throat: lips, mucosa, and tongue normal; teeth and gums normal Neck: no adenopathy, no carotid bruit, no JVD, supple, symmetrical, trachea midline and thyroid not enlarged, symmetric, no tenderness/mass/nodules Back: symmetric, no curvature. ROM normal. No CVA tenderness. Lungs: clear to auscultation bilaterally Heart: regular rate and rhythm, S1, S2 normal, no murmur,  click, rub or gallop Abdomen: soft, non-tender; bowel sounds normal; no masses,  no organomegaly Extremities: extremities normal, atraumatic, no cyanosis or edema Pulses: 2+ and symmetric Skin: Skin color, texture, turgor normal. No rashes or lesions Lymph nodes: no cervical LN Neurologic: Alert and oriented X 3, normal strength and tone. Normal symmetric reflexes. Normal coordination and gait   she is wearing a brace on her right ankle.  Assessment:    Healthy female exam.      Plan:     See After Visit Summary for Counseling Recommendations  Start a regular exercise program and make sure you are eating a healthy diet Try to eat 4 servings of dairy a day or take a calcium supplement (500mg  twice a day). Your vaccines are up to date.  Order screening labs  Osteopenia - Will check vitamin D level she is taking some calcium with vit D but not every day. Discussed taking at least 500mg  daily and working on gettting natural sources from diet.   Hyperlipidemia-she is to recheck lipid panel and CMP today. I had sent refills of her to the pharmacy. She is tolerating her statin well without any myalgias.

## 2011-05-23 NOTE — Patient Instructions (Signed)
Start a regular exercise program and make sure you are eating a healthy diet Try to eat 4 servings of dairy a day or take a calcium supplement (500mg twice a day). Your vaccines are up to date.  We will call you with your lab results. If you don't here from us in about a week then please give us a call at [phone].  

## 2011-05-24 LAB — LIPID PANEL
Cholesterol: 198 mg/dL (ref 0–200)
HDL: 79 mg/dL (ref >39)
Total CHOL/HDL Ratio: 2.5 Ratio
VLDL: 18 mg/dL (ref 0–40)

## 2011-05-24 LAB — COMPLETE METABOLIC PANEL WITH GFR
AST: 21 U/L (ref 0–37)
Albumin: 4.7 g/dL (ref 3.5–5.2)
Alkaline Phosphatase: 65 U/L (ref 39–117)
BUN: 17 mg/dL (ref 6–23)
GFR, Est Non African American: 77 mL/min
Glucose, Bld: 85 mg/dL (ref 70–99)
Potassium: 4 mEq/L (ref 3.5–5.3)
Sodium: 140 mEq/L (ref 135–145)
Total Bilirubin: 0.6 mg/dL (ref 0.3–1.2)

## 2011-05-24 NOTE — Progress Notes (Signed)
Quick Note:  All labs are normal. ______ 

## 2011-05-24 NOTE — Progress Notes (Signed)
LM for pt that all labs were normal

## 2011-06-01 ENCOUNTER — Telehealth: Payer: Self-pay | Admitting: Obstetrics & Gynecology

## 2011-06-06 LAB — HEMOCCULT GUIAC POC 1CARD (OFFICE)
Card #3 Fecal Occult Blood, POC: NEGATIVE
Fecal Occult Blood, POC: NEGATIVE

## 2011-06-06 NOTE — Progress Notes (Signed)
Addended by: Ellsworth Lennox on: 06/06/2011 04:54 PM   Modules accepted: Orders

## 2011-10-05 ENCOUNTER — Encounter: Payer: Self-pay | Admitting: Physician Assistant

## 2011-10-05 ENCOUNTER — Ambulatory Visit
Admission: RE | Admit: 2011-10-05 | Discharge: 2011-10-05 | Disposition: A | Discharge status: Home / Self Care | Payer: BC Managed Care – PPO | Source: Ambulatory Visit | Attending: Physician Assistant | Admitting: Physician Assistant

## 2011-10-05 ENCOUNTER — Ambulatory Visit (INDEPENDENT_AMBULATORY_CARE_PROVIDER_SITE_OTHER): Payer: BC Managed Care – PPO | Admitting: Physician Assistant

## 2011-10-05 VITALS — BP 124/72 | HR 79 | Ht 64.0 in | Wt 132.0 lb

## 2011-10-05 DIAGNOSIS — S8992XA Unspecified injury of left lower leg, initial encounter: Secondary | ICD-10-CM

## 2011-10-05 DIAGNOSIS — S8990XA Unspecified injury of unspecified lower leg, initial encounter: Secondary | ICD-10-CM

## 2011-10-05 NOTE — Progress Notes (Signed)
  Subjective:    Patient ID: Maria Romero, female    DOB: 1950-08-22, 61 y.o.   MRN: [id number]  HPI Patient presents to the clinic with left knee pain for 3 days since fell on stairs. She has iced and used aleve and it has helped. She is able to walk on it but wanted to get it checked out. The pain is 4/10 but She noticed swelling 2 days ago and continues. She has not had any knee catching or giving way.    Review of Systems     Objective:   Physical Exam  Constitutional: She is oriented to person, place, and time. She appears well-developed and well-nourished.  Musculoskeletal:       Left knee normal ROM, strength 5/5, knee reflex 2+, no joint tenderness. Mild effusion around the knee. NO color changes or brusing.   Able to bear weight no left knee but does have a slight limp favoring right leg.  Neurological: She is alert and oriented to person, place, and time.          Assessment & Plan:  Left knee injury- Will get xray and call pt today with results. REst, ICE, Compress, and elevate. Continue to use aleve for pain. Get flexible brace at Drug store to help with swelling and stablization. If pain continues or finds her knee catching or giving way could be some meniscal injury that would need further eval.

## 2011-10-05 NOTE — Patient Instructions (Addendum)
Continue Aleve. Rest, ICE, Compress, and Elevate. Will call with results of x-ray today.

## 2012-01-16 ENCOUNTER — Other Ambulatory Visit: Payer: Self-pay | Admitting: Family Medicine

## 2012-01-16 DIAGNOSIS — Z1231 Encounter for screening mammogram for malignant neoplasm of breast: Secondary | ICD-10-CM

## 2012-02-09 ENCOUNTER — Ambulatory Visit (INDEPENDENT_AMBULATORY_CARE_PROVIDER_SITE_OTHER): Payer: BC Managed Care – PPO

## 2012-02-09 DIAGNOSIS — Z1231 Encounter for screening mammogram for malignant neoplasm of breast: Secondary | ICD-10-CM

## 2012-02-29 ENCOUNTER — Ambulatory Visit (INDEPENDENT_AMBULATORY_CARE_PROVIDER_SITE_OTHER): Payer: BC Managed Care – PPO | Admitting: Family Medicine

## 2012-02-29 VITALS — Temp 97.7°F

## 2012-02-29 DIAGNOSIS — Z23 Encounter for immunization: Secondary | ICD-10-CM

## 2012-02-29 NOTE — Progress Notes (Signed)
  Subjective:    Patient ID: Maria Romero, female    DOB: 04/27/1950, 61 y.o.   MRN: [id number]  HPI Here for flu shot   Review of Systems     Objective:   Physical Exam        Assessment & Plan:

## 2012-03-07 NOTE — Telephone Encounter (Signed)
Phone (Outgoing) Marny Lowenstein Completed- I spoke with Maria Romero and gave her the results of her unchanged DEXA. I offered her watchful waiting, HRT, or fosamax-like drugs. She will consider her options and call me and let me know what she wants.

## 2012-05-07 ENCOUNTER — Other Ambulatory Visit: Payer: Self-pay | Admitting: Family Medicine

## 2012-09-07 ENCOUNTER — Telehealth: Payer: Self-pay | Admitting: *Deleted

## 2012-09-07 MED ORDER — SIMVASTATIN 40 MG PO TABS: ORAL_TABLET | ORAL | Status: DC

## 2012-09-07 NOTE — Telephone Encounter (Signed)
Pt left a message that she had spoke with Dr. Linford Arnold and she ok'ed a refill of the zocor to be sent to pharm and its not there;sent rx and spoke with pt to be sure call so we can get lab order for her to  have chol drawn in the near future.Pt voiced undertanding

## 2012-10-15 ENCOUNTER — Encounter: Payer: Self-pay | Admitting: Family Medicine

## 2012-10-15 ENCOUNTER — Ambulatory Visit (INDEPENDENT_AMBULATORY_CARE_PROVIDER_SITE_OTHER): Payer: Managed Care, Other (non HMO) | Admitting: Family Medicine

## 2012-10-15 VITALS — BP 128/82 | HR 61 | Ht 64.0 in | Wt 133.0 lb

## 2012-10-15 DIAGNOSIS — Z0000 Encounter for general adult medical examination without abnormal findings: Secondary | ICD-10-CM

## 2012-10-15 LAB — COMPLETE METABOLIC PANEL WITH GFR
Chloride: 103 mEq/L (ref 96–112)
Creat: 0.92 mg/dL (ref 0.50–1.10)
GFR, Est African American: 77 mL/min
GFR, Est Non African American: 66 mL/min
Potassium: 4.5 mEq/L (ref 3.5–5.3)
Sodium: 139 mEq/L (ref 135–145)
Total Bilirubin: 0.7 mg/dL (ref 0.3–1.2)

## 2012-10-15 LAB — LIPID PANEL
HDL: 112 mg/dL (ref >39)
Triglycerides: 70 mg/dL (ref <150)

## 2012-10-15 LAB — CBC
HCT: 40.6 % (ref 36.0–46.0)
Hemoglobin: 14.4 g/dL (ref 12.0–15.0)
MCH: 31.6 pg (ref 26.0–34.0)
MCHC: 35.5 g/dL (ref 30.0–36.0)

## 2012-10-15 MED ORDER — SIMVASTATIN 40 MG PO TABS: ORAL_TABLET | ORAL | Status: DC

## 2012-10-15 NOTE — Progress Notes (Signed)
  Subjective:     Maria Romero is a 62 y.o. female and is here for a comprehensive physical exam. The patient reports no problems. Last mammogram was October 2013. She'll be due later this fall. Last Pap smear was January 2013. Has been a year and half. She's not due for 2-3 years since her Pap smear was normal and she has not had any new sexual partner since the death of her husband. She denies any pelvic pain, problems or discharge. She denies any breast lumps or bumps.  Her last eye exam was last fall.  History   Social History  . Marital Status: Widowed    Spouse Name: N/A    Number of Children: N/A  . Years of Education: N/A   Occupational History  . retired Designer, industrial/product    Social History Main Topics  . Smoking status: Former Smoker    Types: Cigarettes    Quit date: 03/20/1990  . Smokeless tobacco: Never Used     Comment: quit 1988  . Alcohol Use: .6 - 1.2 oz/week    1-2 Glasses of wine per week     Comment: Wine in the evenings  . Drug Use: No  . Sexually Active: No   Other Topics Concern  . Not on file   Social History Narrative   Regularly exercise.    Health Maintenance  Topic Date Due  . Influenza Vaccine  12/10/2012  . Mammogram  02/08/2014  . Pap Smear  05/09/2014  . Tetanus/tdap  08/09/2016  . Colonoscopy  05/22/2021  . Zostavax  Completed    The following portions of the patient's history were reviewed and updated as appropriate: allergies, current medications, past family history, past medical history, past social history, past surgical history and problem list.  Review of Systems A comprehensive review of systems was negative.   Objective:    BP 140/76  Pulse 61  Ht 5\' 4"  (1.626 m)  Wt 133 lb (60.328 kg)  BMI 22.82 kg/m2 General appearance: alert, cooperative and appears stated age Head: Normocephalic, without obvious abnormality, atraumatic Eyes: conj clear, EOMi, PEERLA Ears: normal TM's and external ear canals both ears Nose: Nares  normal. Septum midline. Mucosa normal. No drainage or sinus tenderness. Throat: lips, mucosa, and tongue normal; teeth and gums normal Neck: no adenopathy, no carotid bruit, supple, symmetrical, trachea midline and thyroid not enlarged, symmetric, no tenderness/mass/nodules Back: symmetric, no curvature. ROM normal. No CVA tenderness. Lungs: clear to auscultation bilaterally Breasts: normal appearance, no masses or tenderness Heart: regular rate and rhythm, S1, S2 normal, no murmur, click, rub or gallop Abdomen: soft, non-tender; bowel sounds normal; no masses,  no organomegaly Extremities: extremities normal, atraumatic, no cyanosis or edema Pulses: 2+ and symmetric Skin: Skin color, texture, turgor normal. No rashes or lesions Lymph nodes: Cervical, supraclavicular, and axillary nodes normal. Neurologic: Alert and oriented X 3, normal strength and tone. Normal symmetric reflexes. Normal coordination and gait    Assessment:    Healthy female exam.      Plan:     See After Visit Summary for Counseling Recommendations  complete physical examination Due for screening labs. Pap smear will be due January 2016. Keep appointment for mammogram and eye exam in the fall.  Hyperlipidemia-continue statin. She's tolerating it well without any side effects or myalgias. Due to repeat levels to make sure that she's still well controlled in the liver enzymes are within the normal range.

## 2012-10-15 NOTE — Patient Instructions (Addendum)
Keep up a regular exercise program and make sure you are eating a healthy diet Try to eat 4 servings of dairy a day, or if you are lactose intolerant take a calcium with vitamin D daily.  Your vaccines are up to date.   

## 2012-12-06 ENCOUNTER — Ambulatory Visit (INDEPENDENT_AMBULATORY_CARE_PROVIDER_SITE_OTHER): Payer: Managed Care, Other (non HMO) | Admitting: *Deleted

## 2012-12-06 DIAGNOSIS — Z23 Encounter for immunization: Secondary | ICD-10-CM

## 2012-12-24 IMAGING — CR DG FOOT COMPLETE 3+V*L*
3 series · 3 of 3 positions shown · non-contrast
Comparison: None

CLINICAL DATA: Fall, bilateral foot pain.

LEFT FOOT - COMPLETE 3+ VIEW

[view not recorded (1 of 3)]
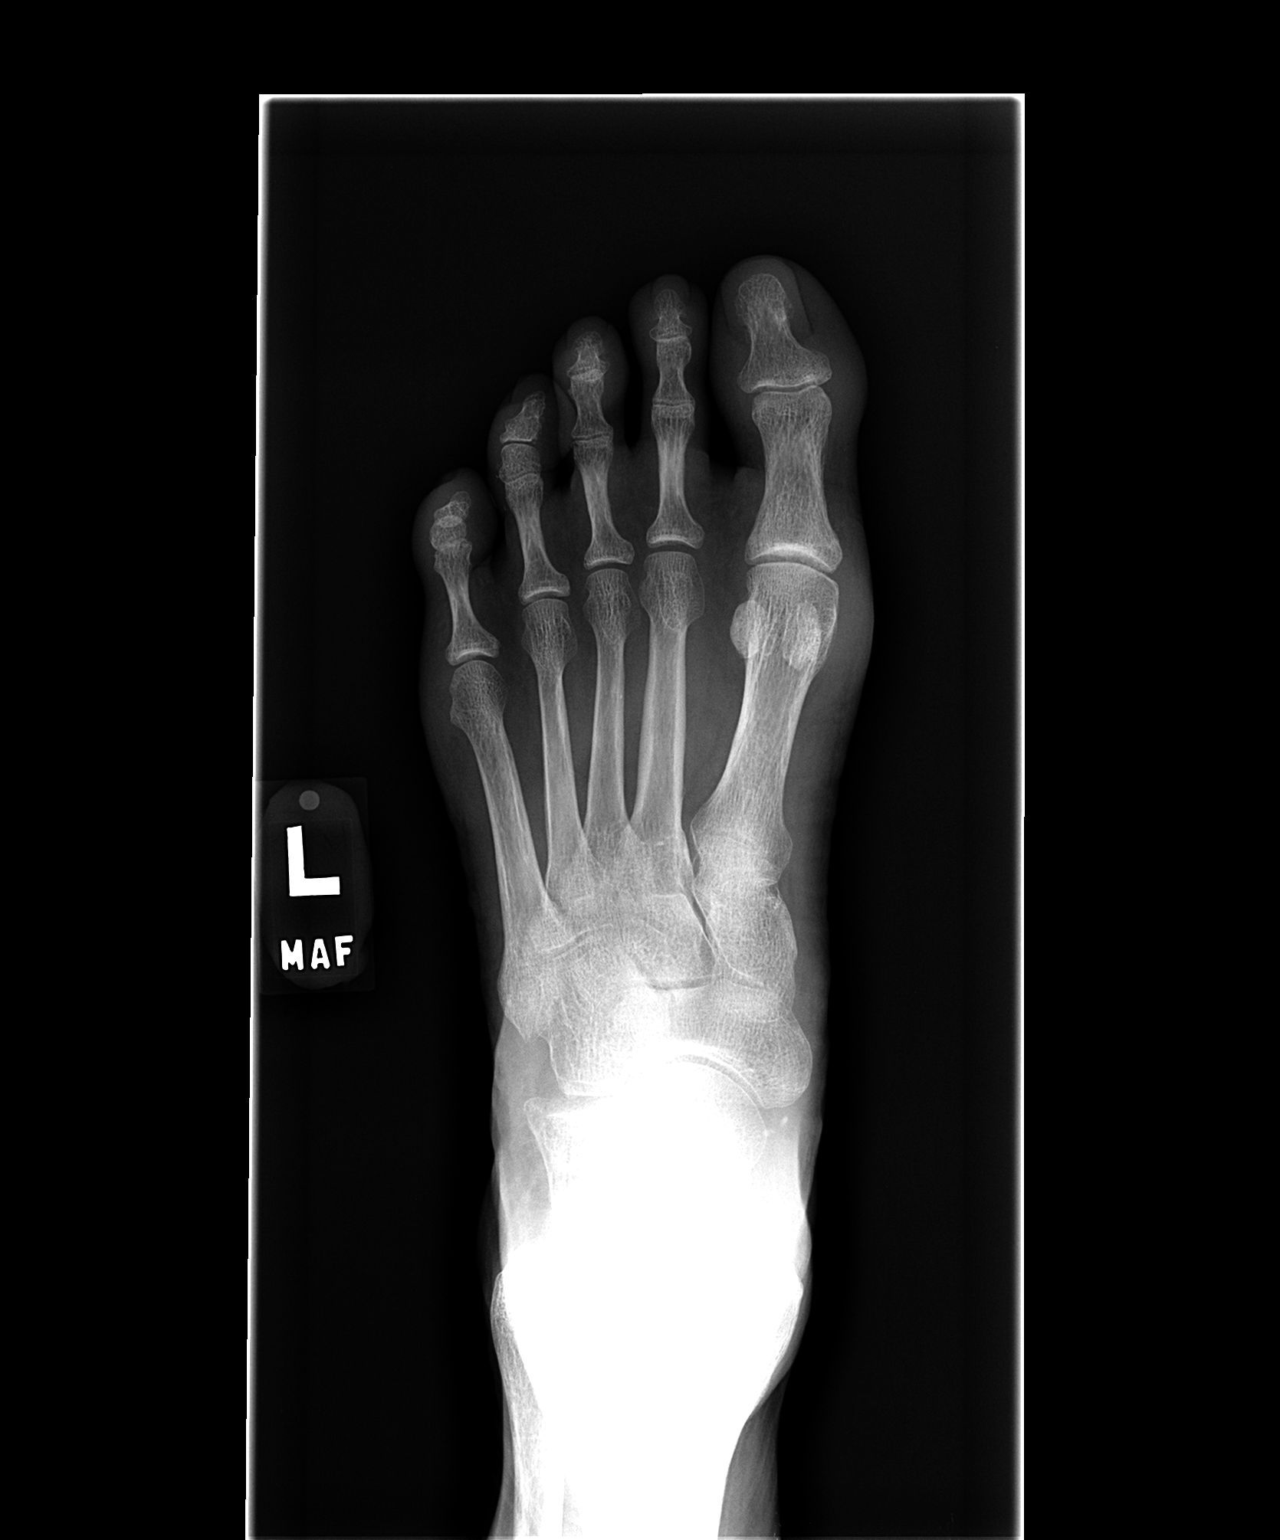

[view not recorded (2 of 3)]
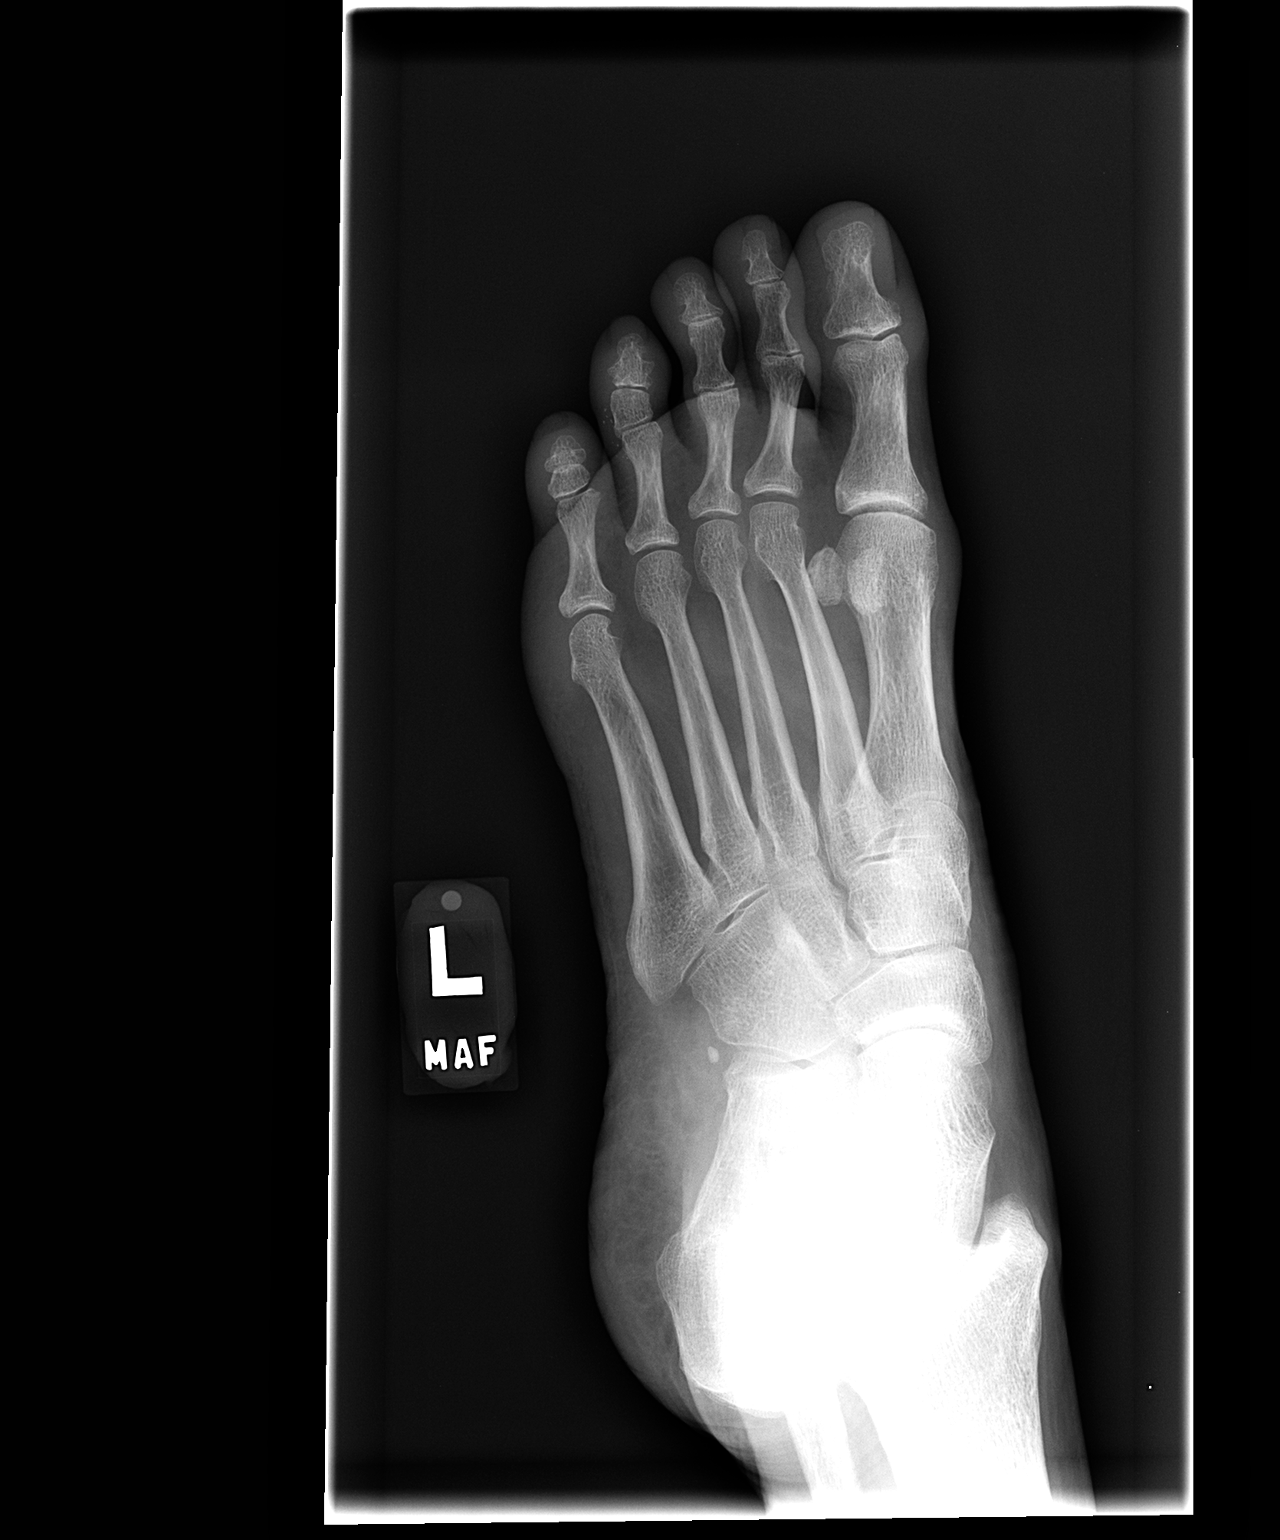

[view not recorded (3 of 3)]
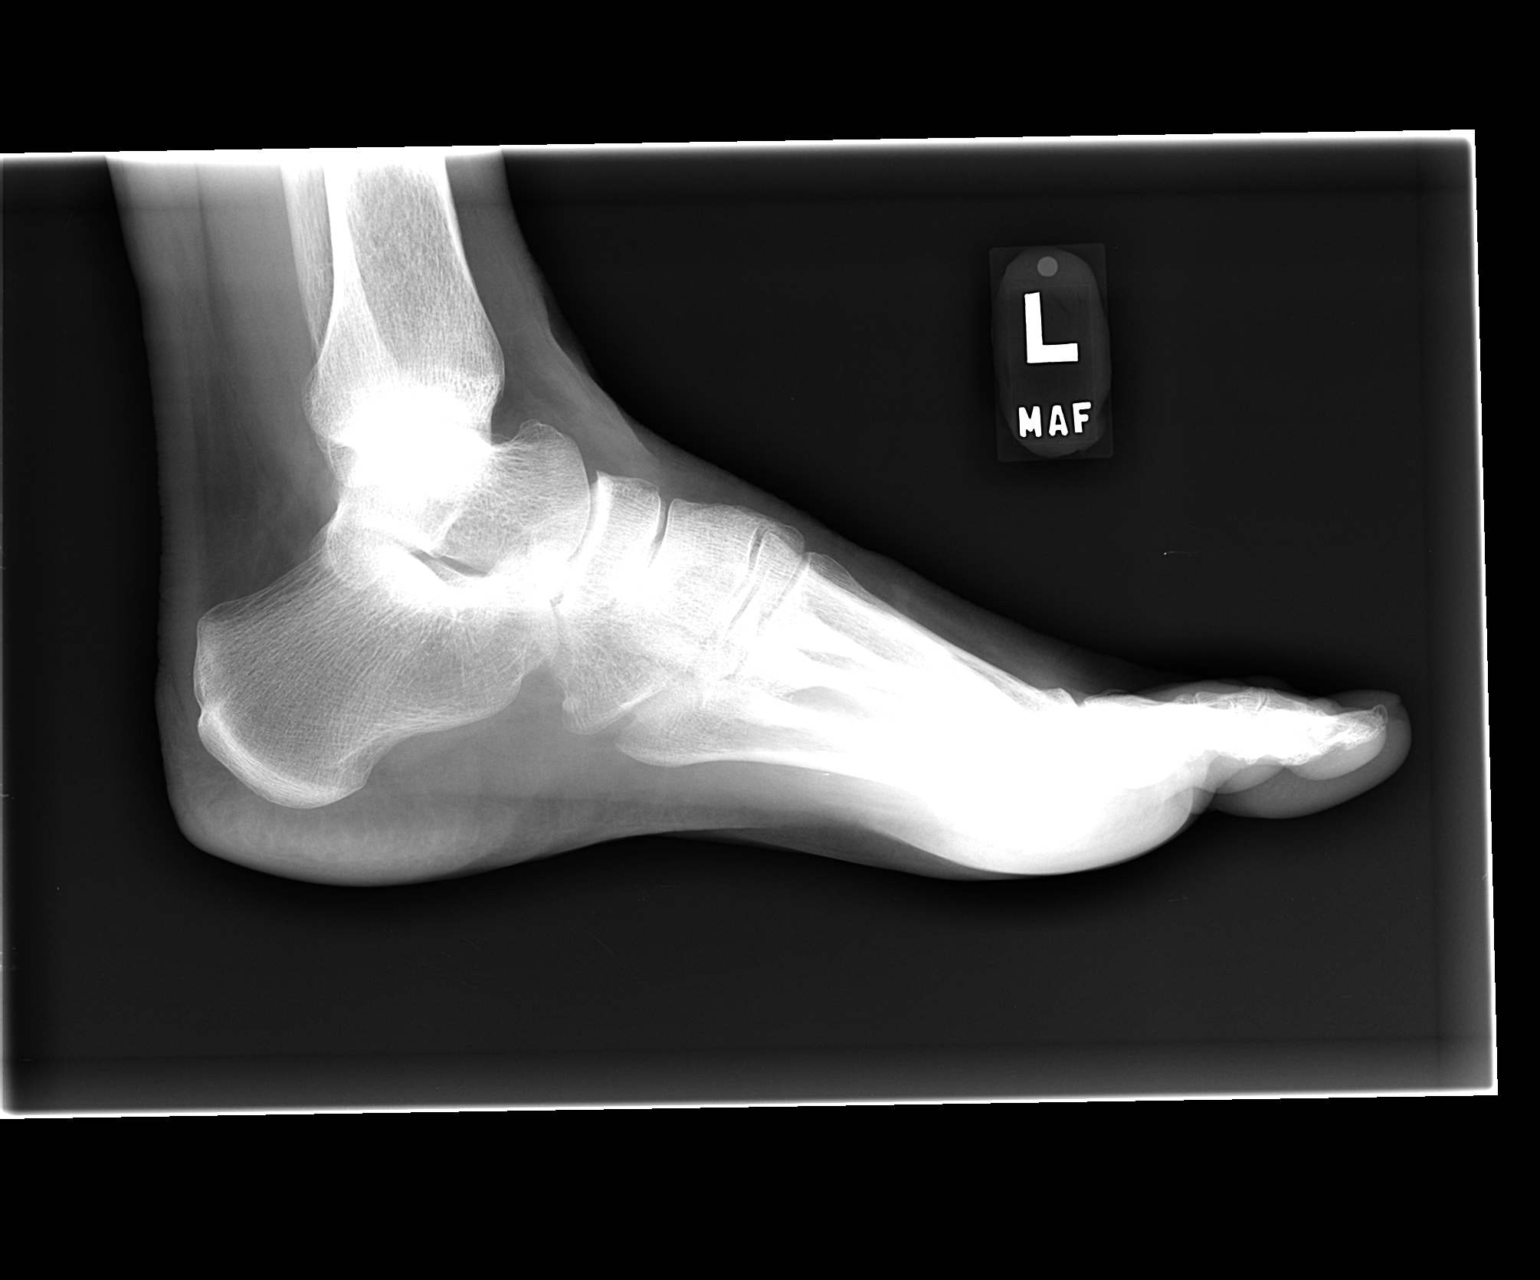

[3 of 3 positions shown; findings below may reference images not displayed]

FINDINGS: No acute bony abnormality.  Specifically, no fracture,
subluxation, or dislocation.  Soft tissues are intact.
IMPRESSION: No acute bony abnormality.

## 2013-01-03 ENCOUNTER — Other Ambulatory Visit: Payer: Self-pay | Admitting: Family Medicine

## 2013-01-31 ENCOUNTER — Other Ambulatory Visit: Payer: Self-pay | Admitting: Family Medicine

## 2013-02-19 ENCOUNTER — Other Ambulatory Visit: Payer: Self-pay | Admitting: *Deleted

## 2013-02-19 MED ORDER — SIMVASTATIN 40 MG PO TABS: ORAL_TABLET | ORAL | Status: DC

## 2013-02-22 IMAGING — CR DG FOOT 2V*R*
2 series · 2 of 2 positions shown · non-contrast
Comparison: Radiographs 03/13/2011 and 04/14/2011.

CLINICAL DATA: Follow-up fracture.  Foot injury 03/12/2011.

RIGHT FOOT - 2 VIEW

[view not recorded (1 of 2)]
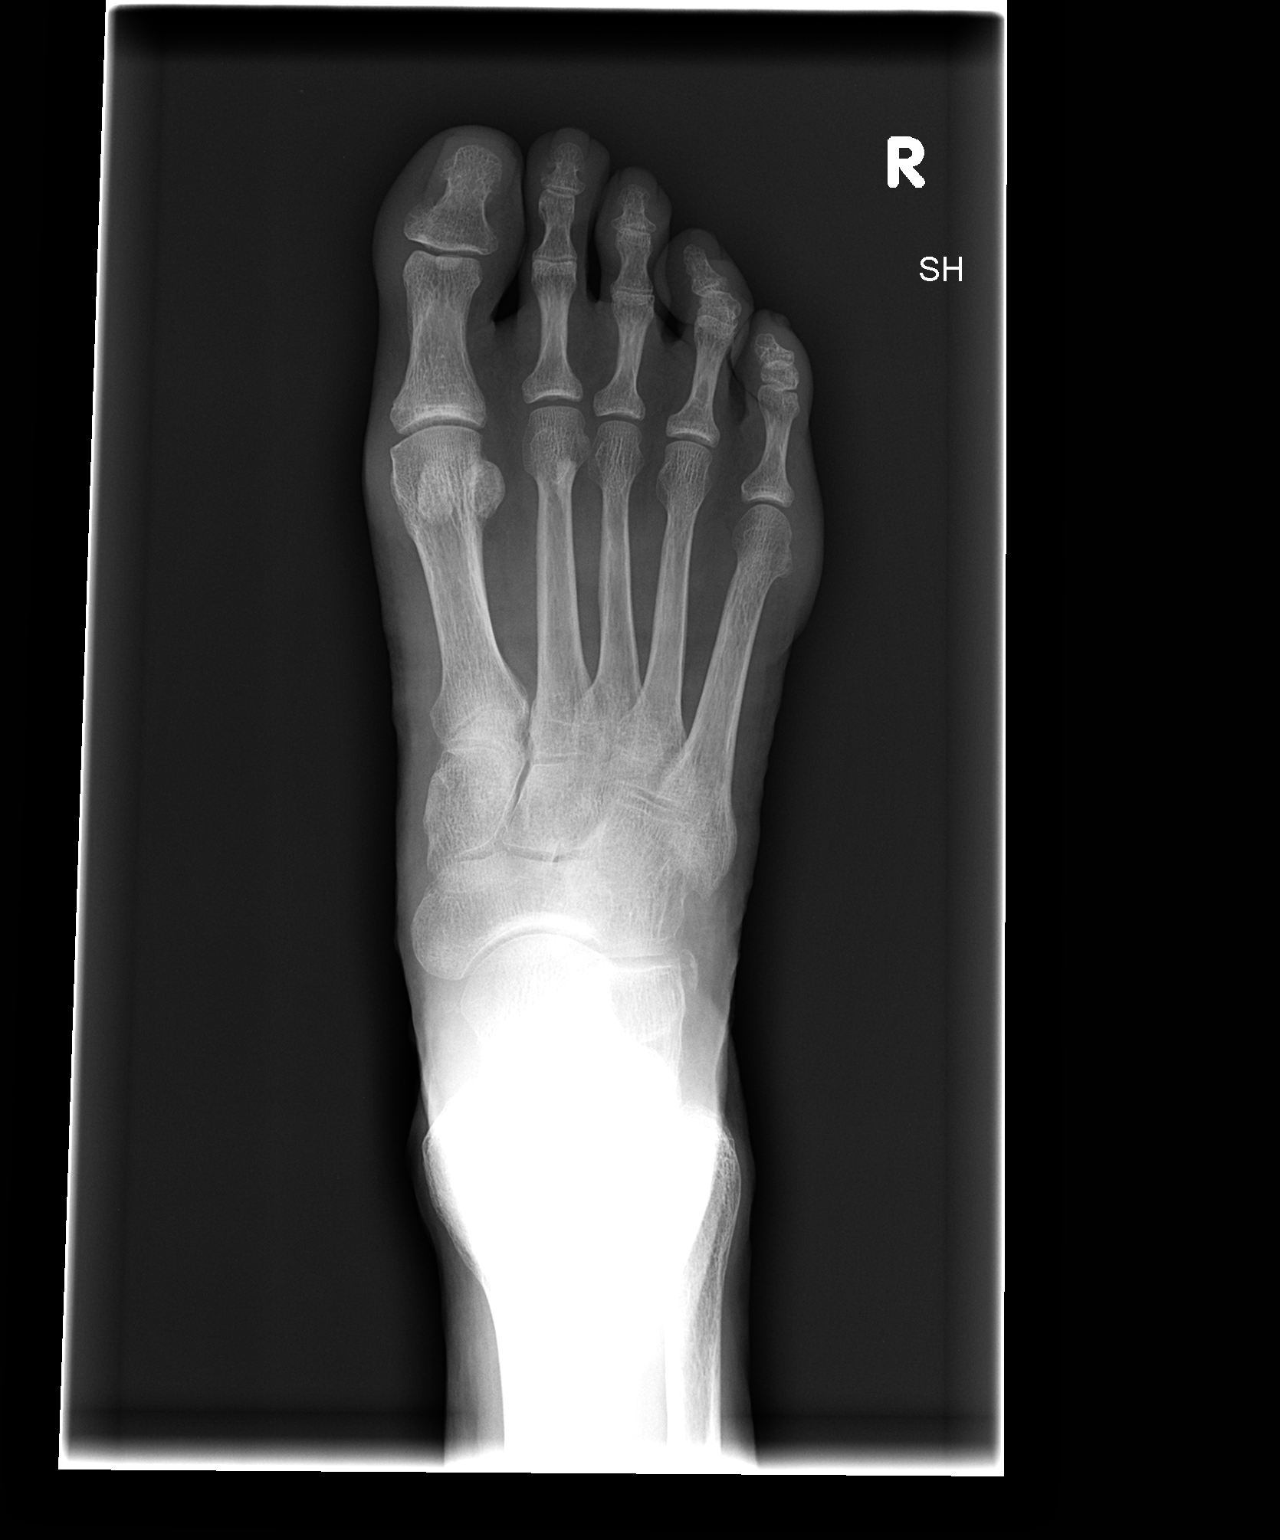

[view not recorded (2 of 2)]
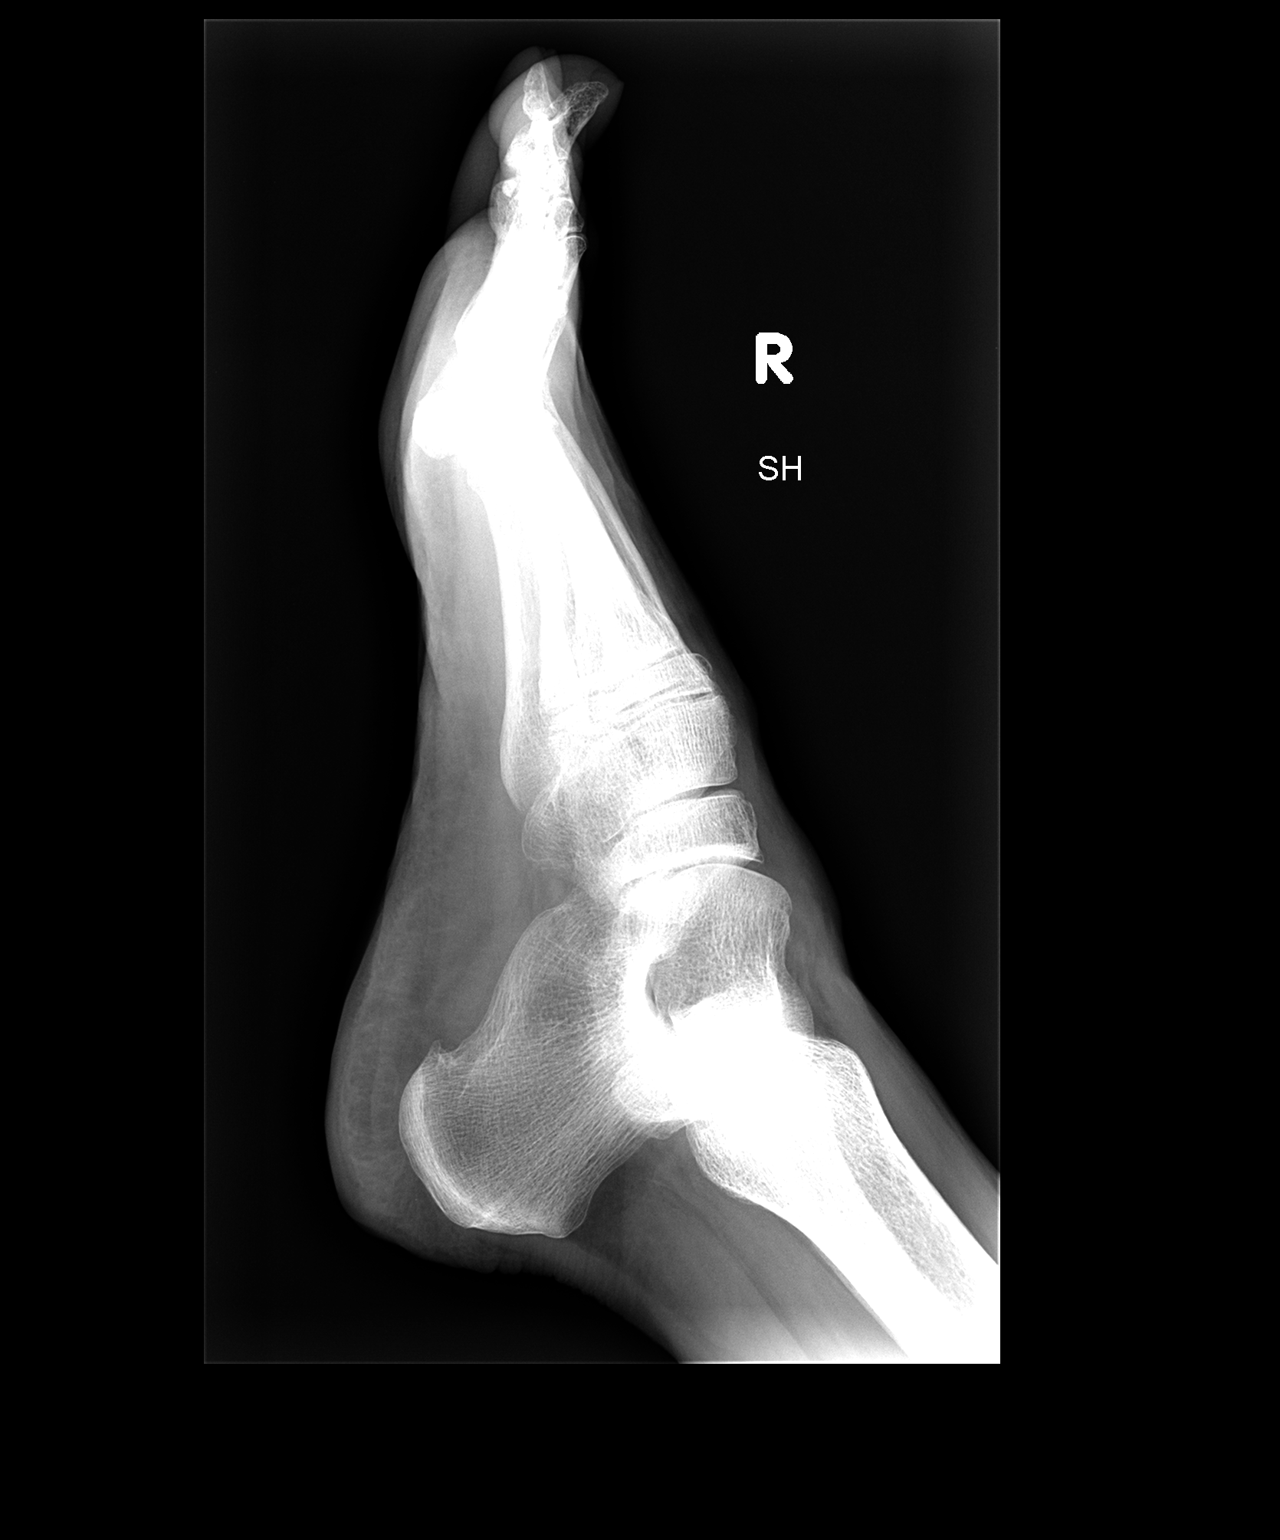

[2 of 2 positions shown; findings below may reference images not displayed]

FINDINGS: Ossific density lateral to the distal calcaneus, just
proximal to the calcaneal cuboid joint is again noted, unchanged.
This is only seen on the AP view.  No other osseous abnormalities
are identified.  The mineralization and alignment are normal.
There is no focal soft tissue swelling.
IMPRESSION: Unchanged appearance of probable avulsion fracture from the
anterolateral calcaneus.  Of note, an os peroneum could have this
appearance.  On the original study, the acute margins do suggest
that this was an avulsion fracture - correlate clinically.

## 2013-03-05 ENCOUNTER — Telehealth: Payer: Self-pay | Admitting: *Deleted

## 2013-03-05 ENCOUNTER — Telehealth: Payer: Self-pay | Admitting: Family Medicine

## 2013-03-05 NOTE — Telephone Encounter (Signed)
Pt wanted to know about mammo results I informed her that they are not in and when I called [facility] I was told they are waiting on the previous results to do a comparison . Pt voiced understanding.Loralee Pacas [place]

## 2013-03-05 NOTE — Telephone Encounter (Signed)
Please call patient: Mammogram is normal. Repeat in one year.

## 2013-03-06 NOTE — Telephone Encounter (Signed)
Pt informed.Maria Romero  

## 2013-03-14 ENCOUNTER — Encounter: Payer: Self-pay | Admitting: Family Medicine

## 2013-03-14 ENCOUNTER — Ambulatory Visit (INDEPENDENT_AMBULATORY_CARE_PROVIDER_SITE_OTHER): Payer: Managed Care, Other (non HMO) | Admitting: Family Medicine

## 2013-03-14 VITALS — BP 146/76 | HR 66 | Temp 97.0°F | Ht 64.0 in | Wt 134.0 lb

## 2013-03-14 DIAGNOSIS — E785 Hyperlipidemia, unspecified: Secondary | ICD-10-CM

## 2013-03-14 DIAGNOSIS — F430 Acute stress reaction: Secondary | ICD-10-CM

## 2013-03-14 DIAGNOSIS — R030 Elevated blood-pressure reading, without diagnosis of hypertension: Secondary | ICD-10-CM

## 2013-03-14 MED ORDER — SIMVASTATIN 40 MG PO TABS: ORAL_TABLET | ORAL | Status: DC

## 2013-03-14 NOTE — Progress Notes (Signed)
   Subjective:    Patient ID: Maria Romero, female    DOB: [birth year], 62 y.o.   MRN: [id number]  HPI Here to discusss BP today. Says hbeen running a little high the last month or so. She feels it is directly related to her stress levels.  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 He is now getting Home heath and has someone to help shower and shave him twice a week. She also hasn't been ablt to get to the gym for the last month or so.   Hypercholesteremia-tolerating statin well. She got to the pharmacy saying that she an appointment before she get refills. Review of Systems     Objective:   Physical Exam  Constitutional: She is oriented to person, place, and time. She appears well-developed and well-nourished.  HENT:  Head: Normocephalic and atraumatic.  Cardiovascular: Normal rate, regular rhythm and normal heart sounds.   Pulmonary/Chest: Effort normal and breath sounds normal.  Neurological: She is alert and oriented to person, place, and time.  Skin: Skin is warm and dry.  Psychiatric: She has a normal mood and affect. Her behavior is normal.          Assessment & Plan:  Elevated blood pressure-it is mildly elevated today. She normally has fantastic blood pressure. We discussed ways to continue work on her stress levels. She's not at the point of going to place her father in a nursing home. I would like to see her back in one month after the holidays to make sure that her pressures come down. Still elevated at a time we discussed the need to start her blood pressure medication.  Acute situational stress-we also discussed the possibility of counseling or medication to may be help control her emotional reactions to stress. She says she has been thinking about it and will continue to think about it but is not ready to do so yet.  Hypercholesterolemia-her labs are up-to-date we do not do to recheck for 6 more months. One-year supply sent to her local pharmacy.

## 2013-03-21 ENCOUNTER — Encounter: Payer: Self-pay | Admitting: Family Medicine

## 2013-12-05 ENCOUNTER — Other Ambulatory Visit: Payer: Self-pay | Admitting: Family Medicine

## 2013-12-05 MED ORDER — METOPROLOL SUCCINATE ER 25 MG PO TB24
25.0000 mg | ORAL_TABLET | Freq: Every day | ORAL | Status: DC
Start: 2013-12-05 — End: 2014-01-28

## 2014-01-14 ENCOUNTER — Ambulatory Visit (INDEPENDENT_AMBULATORY_CARE_PROVIDER_SITE_OTHER): Payer: Medicare Other | Admitting: Family Medicine

## 2014-01-14 DIAGNOSIS — Z23 Encounter for immunization: Secondary | ICD-10-CM

## 2014-01-14 NOTE — Progress Notes (Signed)
   Subjective:    Patient ID: Maria Romero, female    DOB: [birth year], 63 y.o.   MRN: [id number] Pt asked for flu shot while in the office with her father.  Given LD.  No complications.  Beatris Ship, CMA HPI    Review of Systems     Objective:   Physical Exam        Assessment & Plan:

## 2014-01-28 ENCOUNTER — Other Ambulatory Visit: Payer: Self-pay | Admitting: *Deleted

## 2014-01-28 MED ORDER — METOPROLOL SUCCINATE ER 25 MG PO TB24: 25.0000 mg | ORAL_TABLET | Freq: Every day | ORAL | Status: DC

## 2014-01-28 MED ORDER — SIMVASTATIN 40 MG PO TABS: ORAL_TABLET | ORAL | Status: DC

## 2014-02-10 ENCOUNTER — Other Ambulatory Visit: Payer: Self-pay | Admitting: *Deleted

## 2014-02-10 ENCOUNTER — Encounter: Payer: Self-pay | Admitting: Family Medicine

## 2014-02-10 ENCOUNTER — Other Ambulatory Visit: Payer: Self-pay | Admitting: Family Medicine

## 2014-02-10 DIAGNOSIS — Z1231 Encounter for screening mammogram for malignant neoplasm of breast: Secondary | ICD-10-CM

## 2014-02-13 ENCOUNTER — Ambulatory Visit (INDEPENDENT_AMBULATORY_CARE_PROVIDER_SITE_OTHER): Payer: Medicare Other

## 2014-02-13 DIAGNOSIS — Z1231 Encounter for screening mammogram for malignant neoplasm of breast: Secondary | ICD-10-CM

## 2014-03-28 ENCOUNTER — Other Ambulatory Visit: Payer: Self-pay | Admitting: Family Medicine

## 2014-03-31 ENCOUNTER — Other Ambulatory Visit: Payer: Self-pay | Admitting: *Deleted

## 2014-03-31 DIAGNOSIS — E7800 Pure hypercholesterolemia, unspecified: Secondary | ICD-10-CM

## 2014-03-31 DIAGNOSIS — R001 Bradycardia, unspecified: Secondary | ICD-10-CM

## 2014-03-31 MED ORDER — SIMVASTATIN 40 MG PO TABS: ORAL_TABLET | ORAL | Status: DC

## 2014-03-31 MED ORDER — METOPROLOL SUCCINATE ER 25 MG PO TB24: 25.0000 mg | ORAL_TABLET | Freq: Every day | ORAL | Status: DC

## 2014-03-31 NOTE — Progress Notes (Signed)
Pt called and advised that she will need to schedule a f/u appt and labs to get refills. Lab order placed and faxed pt transferred to scheduling for appt. Medications ordered.Maria Romero [place]

## 2014-04-02 ENCOUNTER — Other Ambulatory Visit: Payer: Self-pay | Admitting: *Deleted

## 2014-04-02 DIAGNOSIS — E7800 Pure hypercholesterolemia, unspecified: Secondary | ICD-10-CM

## 2014-04-02 DIAGNOSIS — R001 Bradycardia, unspecified: Secondary | ICD-10-CM | POA: Diagnosis not present

## 2014-04-02 LAB — LIPID PANEL
CHOL/HDL RATIO: 2.4 ratio
CHOLESTEROL: 201 mg/dL — AB (ref 0–200)
HDL: 84 mg/dL (ref >39)
LDL Cholesterol: 98 mg/dL (ref 0–99)
Triglycerides: 96 mg/dL (ref <150)
VLDL: 19 mg/dL (ref 0–40)

## 2014-04-02 LAB — COMPLETE METABOLIC PANEL WITH GFR
ALBUMIN: 4.3 g/dL (ref 3.5–5.2)
ALT: 19 U/L (ref 0–35)
AST: 22 U/L (ref 0–37)
Alkaline Phosphatase: 43 U/L (ref 39–117)
BILIRUBIN TOTAL: 0.7 mg/dL (ref 0.2–1.2)
BUN: 17 mg/dL (ref 6–23)
CO2: 26 meq/L (ref 19–32)
Calcium: 9.5 mg/dL (ref 8.4–10.5)
Chloride: 105 mEq/L (ref 96–112)
Creat: 0.87 mg/dL (ref 0.50–1.10)
GFR, EST AFRICAN AMERICAN: 81 mL/min
GFR, EST NON AFRICAN AMERICAN: 70 mL/min
GLUCOSE: 93 mg/dL (ref 70–99)
Potassium: 4.3 mEq/L (ref 3.5–5.3)
SODIUM: 139 meq/L (ref 135–145)
TOTAL PROTEIN: 6.3 g/dL (ref 6.0–8.3)

## 2014-04-03 LAB — TSH: TSH: 2.954 u[IU]/mL (ref 0.350–4.500)

## 2014-04-07 NOTE — Progress Notes (Signed)
Quick Note:  All labs are normal. ______ 

## 2014-04-23 ENCOUNTER — Ambulatory Visit (INDEPENDENT_AMBULATORY_CARE_PROVIDER_SITE_OTHER): Payer: Medicare Other | Admitting: Family Medicine

## 2014-04-23 ENCOUNTER — Encounter: Payer: Self-pay | Admitting: Family Medicine

## 2014-04-23 VITALS — BP 131/65 | HR 51 | Ht 64.0 in | Wt 138.0 lb

## 2014-04-23 DIAGNOSIS — I10 Essential (primary) hypertension: Secondary | ICD-10-CM

## 2014-04-23 DIAGNOSIS — Z23 Encounter for immunization: Secondary | ICD-10-CM | POA: Diagnosis not present

## 2014-04-23 NOTE — Addendum Note (Signed)
Addended by: Teddy Spike on: 04/23/2014 10:27 AM   Modules accepted: Orders

## 2014-04-23 NOTE — Addendum Note (Signed)
Addended by: Teddy Spike on: 04/23/2014 10:30 AM   Modules accepted: Orders

## 2014-04-23 NOTE — Progress Notes (Signed)
   Subjective:    Patient ID: Maria Romero, female    DOB: [birth year], 64 y.o.   MRN: [id number]  HPI Hypertension- Pt denies chest pain, SOB, dizziness, or heart palpitations.  Taking meds as directed w/o problems.  Denies medication side effects.     Her daughter is having a new baby and wants to make sure that she has protection against pertussis.  Review of Systems     Objective:   Physical Exam  Constitutional: She is oriented to person, place, and time. She appears well-developed and well-nourished.  HENT:  Head: Normocephalic and atraumatic.  Cardiovascular: Normal rate, regular rhythm and normal heart sounds.   Pulmonary/Chest: Effort normal and breath sounds normal.  Neurological: She is alert and oriented to person, place, and time.  Skin: Skin is warm and dry.  Psychiatric: She has a normal mood and affect. Her behavior is normal.          Assessment & Plan:  HTN - well controlled today. Looks fantastic. Follow-up in 6 months.  Due for tdap and Prevnar 13 today. Both given. We'll need a pneumococcal 23 in one year.

## 2014-06-18 DIAGNOSIS — D3132 Benign neoplasm of left choroid: Secondary | ICD-10-CM | POA: Diagnosis not present

## 2014-09-24 ENCOUNTER — Ambulatory Visit (INDEPENDENT_AMBULATORY_CARE_PROVIDER_SITE_OTHER): Payer: Medicare Other | Admitting: Family Medicine

## 2014-09-24 ENCOUNTER — Encounter: Payer: Self-pay | Admitting: Family Medicine

## 2014-09-24 VITALS — BP 120/68 | HR 59 | Wt 141.0 lb

## 2014-09-24 DIAGNOSIS — M79671 Pain in right foot: Secondary | ICD-10-CM | POA: Diagnosis not present

## 2014-09-24 MED ORDER — DICLOFENAC SODIUM 2 % TD SOLN: TRANSDERMAL | Status: DC

## 2014-09-24 NOTE — Progress Notes (Signed)
CC: DONYE Romero is a 64 y.o. female is here for right foot pain   Subjective: HPI:  Right foot pain localized on the dorsal surface of the foot just proximal to the second toe that was noticed a little over a week ago after awakening at her normal time. It was described only as pain and would radiate proximally towards the anterior ankle. It was worse with any dorsiflexion of the foot or with weightbearing. She's been icing it and keeping it elevated. Symptoms have significantly improved from moderate to almost absent in severity. She was able to mow her lawn this afternoon before coming here without any pain whatsoever. She's had some swelling that was prominent when the pain was present but now only mild in severity. She denies any other discoloration or changes in the foot. She denies any other joint pain. She denies any recent or remote trauma at this location of the foot.   Review Of Systems Outlined In HPI  Past Medical History  Diagnosis Date  . Hypercholesterolemia   . Osteopenia   . IBS (irritable bowel syndrome)     Past Surgical History  Procedure Laterality Date  . Lump removal  1995  . Tonsillectomy    . Tubal ligation  1990   Family History  Problem Relation Age of Onset  . Stroke Mother   . Hypertension Mother   . Hyperlipidemia Mother   . Heart attack Father 38  . Diabetes Father   . Hypertension Father   . Hyperlipidemia Father   . Breast cancer Cousin   . Glaucoma Mother   . Cataracts Father     History   Social History  . Marital Status: Widowed    Spouse Name: N/A  . Number of Children: N/A  . Years of Education: N/A   Occupational History  . retired Quarry manager    Social History Main Topics  . Smoking status: Former Smoker    Types: Cigarettes    Quit date: 03/20/1990  . Smokeless tobacco: Never Used     Comment: quit 1988  . Alcohol Use: 0.6 - 1.2 oz/week    1-2 Glasses of wine per week     Comment: Wine in the evenings  . Drug Use: No   . Sexual Activity: No   Other Topics Concern  . Not on file   Social History Narrative   Regularly exercise.      Objective: BP 120/68 mmHg  Pulse 59  Wt 141 lb (63.957 kg)  Vital signs reviewed. General: Alert and Oriented, No Acute Distress HEENT: Pupils equal, round, reactive to light. Conjunctivae clear.  External ears unremarkable.  Moist mucous membranes. Lungs: Clear and comfortable work of breathing, speaking in full sentences without accessory muscle use. Cardiac: Regular rate and rhythm.  Neuro: CN II-XII grossly intact, gait normal. Extremities: .  Strong peripheral pulses. Trace swelling in the right foot between the second and first metatarsal heads localized to the dorsal surface of the foot. Pain is reproduced with palpation of the head of the second metatarsal. No pain with dorsal plantar translocation of the second/first, second/third, , third/fourth metatarsal heads. No pain with palpation of the first second or third metatarsal shafts. No plantar abnormalities. Mental Status: No depression, anxiety, nor agitation. Logical though process. Skin: Warm and dry.  Assessment & Plan: Maria Romero was seen today for right foot pain.  Diagnoses and all orders for this visit:  Right foot pain Orders: -     DG Foot  Complete Right; Future  Other orders -     Diclofenac Sodium (PENNSAID) 2 % SOLN; One packet to dorsal foot PRN BID.   Right foot pain likely due to tendinitis discussed using a stiff soled shoe for the next 1-2 weeks and use of as needed pennsaid. I put an order for an x-ray just in case her pain returns but there was a joint decision that this is not necessary as of today given the almost absence of her pain.  Return if symptoms worsen or fail to improve.

## 2014-09-26 ENCOUNTER — Other Ambulatory Visit: Payer: Self-pay | Admitting: Family Medicine

## 2014-10-02 ENCOUNTER — Ambulatory Visit (INDEPENDENT_AMBULATORY_CARE_PROVIDER_SITE_OTHER): Payer: Medicare Other

## 2014-10-02 DIAGNOSIS — M79671 Pain in right foot: Secondary | ICD-10-CM | POA: Diagnosis not present

## 2014-10-02 DIAGNOSIS — M7731 Calcaneal spur, right foot: Secondary | ICD-10-CM

## 2014-10-13 ENCOUNTER — Other Ambulatory Visit: Payer: Self-pay | Admitting: Family Medicine

## 2014-10-22 ENCOUNTER — Encounter: Payer: Self-pay | Admitting: Family Medicine

## 2014-10-22 ENCOUNTER — Ambulatory Visit (INDEPENDENT_AMBULATORY_CARE_PROVIDER_SITE_OTHER): Payer: Medicare Other | Admitting: Family Medicine

## 2014-10-22 VITALS — BP 139/73 | HR 57 | Ht 64.0 in | Wt 141.0 lb

## 2014-10-22 DIAGNOSIS — R002 Palpitations: Secondary | ICD-10-CM | POA: Diagnosis not present

## 2014-10-22 DIAGNOSIS — E7800 Pure hypercholesterolemia, unspecified: Secondary | ICD-10-CM

## 2014-10-22 DIAGNOSIS — Z1159 Encounter for screening for other viral diseases: Secondary | ICD-10-CM

## 2014-10-22 DIAGNOSIS — R635 Abnormal weight gain: Secondary | ICD-10-CM

## 2014-10-22 DIAGNOSIS — Z114 Encounter for screening for human immunodeficiency virus [HIV]: Secondary | ICD-10-CM | POA: Diagnosis not present

## 2014-10-22 LAB — COMPLETE METABOLIC PANEL WITH GFR
ALK PHOS: 43 U/L (ref 39–117)
ALT: 18 U/L (ref 0–35)
AST: 20 U/L (ref 0–37)
Albumin: 4.6 g/dL (ref 3.5–5.2)
BILIRUBIN TOTAL: 0.7 mg/dL (ref 0.2–1.2)
BUN: 20 mg/dL (ref 6–23)
CHLORIDE: 104 meq/L (ref 96–112)
CO2: 27 mEq/L (ref 19–32)
Calcium: 9.4 mg/dL (ref 8.4–10.5)
Creat: 0.81 mg/dL (ref 0.50–1.10)
GFR, EST AFRICAN AMERICAN: 88 mL/min
GFR, Est Non African American: 76 mL/min
GLUCOSE: 90 mg/dL (ref 70–99)
Potassium: 4.6 mEq/L (ref 3.5–5.3)
Sodium: 141 mEq/L (ref 135–145)
Total Protein: 6.9 g/dL (ref 6.0–8.3)

## 2014-10-22 LAB — LIPID PANEL
CHOL/HDL RATIO: 1.8 ratio
CHOLESTEROL: 186 mg/dL (ref 0–200)
HDL: 106 mg/dL (ref >=46)
LDL Cholesterol: 64 mg/dL (ref 0–99)
Triglycerides: 78 mg/dL (ref <150)
VLDL: 16 mg/dL (ref 0–40)

## 2014-10-22 LAB — TSH: TSH: 1.897 u[IU]/mL (ref 0.350–4.500)

## 2014-10-22 MED ORDER — PHENTERMINE HCL 30 MG PO CAPS: 30.0000 mg | ORAL_CAPSULE | ORAL | Status: DC

## 2014-10-22 NOTE — Progress Notes (Signed)
   Subjective:    Patient ID: Maria Romero, female    DOB: 03-05-1951, 64 y.o.   MRN: [id number]  HPI Palpitations-currently on beta blocker and doing well. She feels like her symptoms are well controlled. No side effects with the medication. This is her six-month follow-up.     Hyperlipidemia-currently taking simvastatin 40 mg without any palms or side effects. No myalgias.  Abnormal weight gain - she would like to take Fastin (phentermine 30mg )  for a short period of time.  Has taken it previously and did well with it. Hasn't been able to exercise with her foot pain.  She really hasn't changed too much except she's not quite as active with her foot problem recently but has continued to gain weight.  Review of Systems     Objective:   Physical Exam  Constitutional: She is oriented to person, place, and time. She appears well-developed and well-nourished.  HENT:  Head: Normocephalic and atraumatic.  Cardiovascular: Normal rate, regular rhythm and normal heart sounds.   Pulmonary/Chest: Effort normal and breath sounds normal.  Neurological: She is alert and oriented to person, place, and time.  Skin: Skin is warm and dry.  Psychiatric: She has a normal mood and affect. Her behavior is normal.          Assessment & Plan:  Palpitations-well-controlled on metoprolol. Blood pressures just a little borderline today she did not take her medication today but still technically the systolic is under 592.Marland Kitchen    Hyperlipidemia-continue current regimen. Due for CMP and fasting lipid panel.  Abnormal weight gain. Will start phentermine. Likely will take for 1-2 months. Warned about potential S.E. Stop if any CP or palpitatins. F/U in 1 month for nurse BP and weight check. Encouraged her to get back to the gym.

## 2014-10-23 LAB — HEPATITIS C ANTIBODY: HCV Ab: NEGATIVE

## 2014-11-18 ENCOUNTER — Ambulatory Visit (INDEPENDENT_AMBULATORY_CARE_PROVIDER_SITE_OTHER): Payer: Medicare Other | Admitting: Family Medicine

## 2014-11-18 VITALS — BP 128/77 | HR 61 | Wt 137.0 lb

## 2014-11-18 DIAGNOSIS — R635 Abnormal weight gain: Secondary | ICD-10-CM

## 2014-11-18 MED ORDER — PHENTERMINE HCL 30 MG PO CAPS: 30.0000 mg | ORAL_CAPSULE | ORAL | Status: DC

## 2014-11-18 NOTE — Progress Notes (Signed)
   Subjective:    Patient ID: Maria Romero, female    DOB: 1950-05-03, 64 y.o.   MRN: [id number]  HPI  Patient is here for blood pressure and weight check. Denies any trouble sleeping, palpitations, or any other medication problems. Pt does report she is having some left foot pain, identical to the right foot pain she had in the past. Pt has not been in the gym as much lately due to this. Pt was treated last time with Pennsaid ointment and this is still on her Rx list, Pt was given a sample today in office.    Review of Systems     Objective:   Physical Exam        Assessment & Plan:  Patient has lost weight. A refill for Phentermine will be sent to patient preferred pharmacy. Patient advised to schedule a four week nurse visit and keep her upcoming appointment with her PCP. Verbalized understanding, no further questions.  Abnormal weight gain-has lost 4 pounds and is doing well. Blood pressure well controlled. Follow-up in one month.  Beatrice Lecher, MD

## 2014-12-22 ENCOUNTER — Ambulatory Visit (INDEPENDENT_AMBULATORY_CARE_PROVIDER_SITE_OTHER): Payer: Medicare Other | Admitting: Family Medicine

## 2014-12-22 VITALS — BP 129/76 | HR 60 | Resp 16 | Wt 133.2 lb

## 2014-12-22 DIAGNOSIS — R635 Abnormal weight gain: Secondary | ICD-10-CM | POA: Diagnosis not present

## 2014-12-22 MED ORDER — PHENTERMINE HCL 30 MG PO CAPS: 30.0000 mg | ORAL_CAPSULE | ORAL | Status: DC

## 2014-12-22 NOTE — Progress Notes (Signed)
   Subjective:    Patient ID: Maria Romero, female    DOB: 05-Mar-1951, 64 y.o.   MRN: [id number]  HPIPatient is here for blood pressure and weight check. Denies trouble sleeping, palpitations or medication problems.    Review of Systems     Objective:   Physical Exam        Assessment & Plan:  Patient has lost weight. A refill for phentermine will be faxed to pharmacy. Patient advised to schedule appt. for follow up with MD in 30 days. She states she would like to get her Flu and Pneumonia immunizations at that time.  Down 4 lbs. Doing well. BP at gaol. F/U 1 mo  Beatrice Lecher, MD

## 2015-02-09 ENCOUNTER — Ambulatory Visit (INDEPENDENT_AMBULATORY_CARE_PROVIDER_SITE_OTHER): Payer: Medicare Other | Admitting: Family Medicine

## 2015-02-09 ENCOUNTER — Other Ambulatory Visit: Payer: Self-pay | Admitting: Family Medicine

## 2015-02-09 ENCOUNTER — Encounter: Payer: Self-pay | Admitting: Family Medicine

## 2015-02-09 VITALS — BP 137/62 | HR 56 | Temp 97.8°F | Ht 64.0 in | Wt 132.0 lb

## 2015-02-09 DIAGNOSIS — Z23 Encounter for immunization: Secondary | ICD-10-CM | POA: Diagnosis not present

## 2015-02-09 DIAGNOSIS — R635 Abnormal weight gain: Secondary | ICD-10-CM

## 2015-02-09 DIAGNOSIS — I7300 Raynaud's syndrome without gangrene: Secondary | ICD-10-CM | POA: Diagnosis not present

## 2015-02-09 DIAGNOSIS — T148XXA Other injury of unspecified body region, initial encounter: Secondary | ICD-10-CM

## 2015-02-09 DIAGNOSIS — T148 Other injury of unspecified body region: Secondary | ICD-10-CM | POA: Diagnosis not present

## 2015-02-09 LAB — C-REACTIVE PROTEIN: CRP: <0.5 mg/dL (ref <0.60)

## 2015-02-09 LAB — TSH: TSH: 2.235 u[IU]/mL (ref 0.350–4.500)

## 2015-02-09 MED ORDER — AMLODIPINE BESYLATE 5 MG PO TABS
2.5000 mg | ORAL_TABLET | Freq: Every day | ORAL | Status: DC
Start: 2015-02-09 — End: 2015-06-03

## 2015-02-09 MED ORDER — PHENTERMINE HCL 30 MG PO CAPS: 30.0000 mg | ORAL_CAPSULE | ORAL | Status: DC

## 2015-02-09 NOTE — Progress Notes (Signed)
   Subjective:    Patient ID: Maria Romero, female    DOB: 11-Nov-1950, 64 y.o.   MRN: [id number]  HPI Raynauds - felels like her sxs are getting worse. She said before usually was her first finger especially on her right hand and occasionally would affect her other fingers. She says now she doesn't to the grocery store or cold environment it affects all 4 digits and they turn white almost immediately. It's becoming more bothersome especially as the weather gets cooler.  Abnormal weigt gain - no CP or palpitations. Say it really curbed her appetite.  Has lost about 9 lbs.  Not keeping her awake  Breaking blood vessels in her hands. Says that she'll have times where she'll notice that she uses her hand grip something tightly or squeezes she'll get a burning sensation and then suddenly will notice a blood vessel has broken and will bruise. She notices the same thing on her legs as well area but the hands are more bothersome to her.   Review of Systems     Objective:   Physical Exam  Constitutional: She is oriented to person, place, and time. She appears well-developed and well-nourished.  HENT:  Head: Normocephalic and atraumatic.  Eyes: Conjunctivae and EOM are normal.  Cardiovascular: Normal rate, regular rhythm and normal heart sounds.   Pulmonary/Chest: Effort normal and breath sounds normal.  Neurological: She is alert and oriented to person, place, and time.  Skin: Skin is warm and dry. No pallor.  Psychiatric: She has a normal mood and affect. Her behavior is normal.  Vitals reviewed.         Assessment & Plan:  Raynaud's syndrome-discussed recommending a trial of a beta blocker. We can certainly use this seasonally. We will have to use it year round. We'll start amlodipine and have her start with half a tab and okay to increase to whole tab if needed.  Abnormal weight gain-we'll refill her phentermine. She did very well with this. She would like to lose about 5-10 more  pounds. She denies any side effects. We'll need to follow-up in one month for blood pressure and weight check.  Broken blood vessels-this is somewhat unusual to have happen in the hands. Will check some blood work including sedimentation rate and CRP and TSH. She does take a low-dose aspirin daily but no other medications that than the blood.

## 2015-02-10 ENCOUNTER — Other Ambulatory Visit: Payer: Self-pay | Admitting: Family Medicine

## 2015-02-10 DIAGNOSIS — Z1231 Encounter for screening mammogram for malignant neoplasm of breast: Secondary | ICD-10-CM

## 2015-02-10 LAB — SEDIMENTATION RATE: Sed Rate: 1 mm/hr (ref 0–30)

## 2015-02-16 ENCOUNTER — Telehealth: Payer: Self-pay | Admitting: Family Medicine

## 2015-02-16 NOTE — Telephone Encounter (Signed)
Please call patient and let her know that if the amlodipine is working well then we may need to switch her simvastatin to a different statin. The amlodipine can sometimes increase drug levels of the simvastatin. She is on a moderate dose and not high dose so for the shirt short-term this is perfectly fine. But if we do stick with the amlodipine long-term then we may need to change the statin.

## 2015-02-19 NOTE — Telephone Encounter (Signed)
Pt informed of recommendations and will call should she feel that she will need to change this.Maria Romero [place]

## 2015-03-04 ENCOUNTER — Ambulatory Visit (INDEPENDENT_AMBULATORY_CARE_PROVIDER_SITE_OTHER): Payer: Medicare Other

## 2015-03-04 DIAGNOSIS — Z1231 Encounter for screening mammogram for malignant neoplasm of breast: Secondary | ICD-10-CM

## 2015-03-13 ENCOUNTER — Ambulatory Visit (INDEPENDENT_AMBULATORY_CARE_PROVIDER_SITE_OTHER): Payer: Medicare Other | Admitting: Family Medicine

## 2015-03-13 VITALS — BP 133/70 | HR 75 | Ht 64.0 in | Wt 132.0 lb

## 2015-03-13 DIAGNOSIS — R635 Abnormal weight gain: Secondary | ICD-10-CM | POA: Diagnosis not present

## 2015-03-13 MED ORDER — PHENTERMINE HCL 30 MG PO CAPS: 30.0000 mg | ORAL_CAPSULE | ORAL | Status: DC

## 2015-03-13 NOTE — Progress Notes (Signed)
Abnormal weight gain-weight was stable since the last office visit. Will refill for 1 more month at that point if she does not lose then we will discontinue the medication. BMI is now normal.   Maria Lecher, MD

## 2015-03-13 NOTE — Progress Notes (Signed)
Patient was in office for weight and blood pressure check. Rhonda Cunningham,CMA

## 2015-04-07 ENCOUNTER — Other Ambulatory Visit: Payer: Self-pay | Admitting: Family Medicine

## 2015-06-03 ENCOUNTER — Other Ambulatory Visit: Payer: Self-pay | Admitting: Family Medicine

## 2015-08-21 DIAGNOSIS — H5213 Myopia, bilateral: Secondary | ICD-10-CM | POA: Diagnosis not present

## 2015-08-21 DIAGNOSIS — H527 Unspecified disorder of refraction: Secondary | ICD-10-CM | POA: Diagnosis not present

## 2015-08-21 DIAGNOSIS — D3132 Benign neoplasm of left choroid: Secondary | ICD-10-CM | POA: Diagnosis not present

## 2015-08-21 DIAGNOSIS — H524 Presbyopia: Secondary | ICD-10-CM | POA: Diagnosis not present

## 2015-08-27 ENCOUNTER — Other Ambulatory Visit: Payer: Self-pay

## 2015-08-27 MED ORDER — SIMVASTATIN 40 MG PO TABS: 40.0000 mg | ORAL_TABLET | Freq: Every day | ORAL | Status: DC

## 2015-08-28 ENCOUNTER — Other Ambulatory Visit: Payer: Self-pay | Admitting: *Deleted

## 2015-08-28 MED ORDER — AMLODIPINE BESYLATE 5 MG PO TABS
ORAL_TABLET | ORAL | Status: DC
Start: 2015-08-28 — End: 2016-03-09

## 2015-09-03 ENCOUNTER — Telehealth: Payer: Self-pay | Admitting: Family Medicine

## 2015-09-08 NOTE — Telephone Encounter (Signed)
error 

## 2015-09-16 ENCOUNTER — Ambulatory Visit (INDEPENDENT_AMBULATORY_CARE_PROVIDER_SITE_OTHER): Payer: Medicare Other | Admitting: Family Medicine

## 2015-09-16 ENCOUNTER — Encounter: Payer: Self-pay | Admitting: Family Medicine

## 2015-09-16 VITALS — BP 134/62 | HR 52 | Wt 134.0 lb

## 2015-09-16 DIAGNOSIS — E7800 Pure hypercholesterolemia, unspecified: Secondary | ICD-10-CM | POA: Diagnosis not present

## 2015-09-16 DIAGNOSIS — Z23 Encounter for immunization: Secondary | ICD-10-CM

## 2015-09-16 DIAGNOSIS — I10 Essential (primary) hypertension: Secondary | ICD-10-CM | POA: Diagnosis not present

## 2015-09-16 DIAGNOSIS — M5432 Sciatica, left side: Secondary | ICD-10-CM | POA: Diagnosis not present

## 2015-09-16 DIAGNOSIS — R748 Abnormal levels of other serum enzymes: Secondary | ICD-10-CM | POA: Diagnosis not present

## 2015-09-16 LAB — COMPLETE METABOLIC PANEL WITH GFR
ALT: 18 U/L (ref 6–29)
AST: 19 U/L (ref 10–35)
Albumin: 4.7 g/dL (ref 3.6–5.1)
Alkaline Phosphatase: 43 U/L (ref 33–130)
BUN: 20 mg/dL (ref 7–25)
CHLORIDE: 104 mmol/L (ref 98–110)
CO2: 27 mmol/L (ref 20–31)
CREATININE: 0.86 mg/dL (ref 0.50–0.99)
Calcium: 9.5 mg/dL (ref 8.6–10.4)
GFR, EST AFRICAN AMERICAN: 81 mL/min (ref >=60)
GFR, Est Non African American: 71 mL/min (ref >=60)
Glucose, Bld: 95 mg/dL (ref 65–99)
POTASSIUM: 4.4 mmol/L (ref 3.5–5.3)
Sodium: 141 mmol/L (ref 135–146)
Total Bilirubin: 0.6 mg/dL (ref 0.2–1.2)
Total Protein: 6.7 g/dL (ref 6.1–8.1)

## 2015-09-16 LAB — LIPID PANEL
CHOL/HDL RATIO: 1.7 ratio (ref <=5.0)
Cholesterol: 190 mg/dL (ref 125–200)
HDL: 109 mg/dL (ref >=46)
LDL CALC: 68 mg/dL (ref <130)
TRIGLYCERIDES: 64 mg/dL (ref <150)
VLDL: 13 mg/dL (ref <30)

## 2015-09-16 MED ORDER — PREDNISONE 20 MG PO TABS: 40.0000 mg | ORAL_TABLET | Freq: Every day | ORAL | Status: DC

## 2015-09-16 NOTE — Progress Notes (Signed)
Subjective:    CC: HTN  HPI:  Hypertension- Pt denies chest pain, SOB, dizziness, or heart palpitations.  Taking meds as directed w/o problems.  Denies medication side effects.    Hyperlipidemia-currently on simvastatin. Denies any side effects or myalgias with the medication. Due to repeat lipid panel and liver enzymes.  She also thinks that she might have sciatica. She said for about 2-1/2 weeks she's had pain in her left low back and radiating through her buttock into the outside of her hip. Then skips and creates a burning sensation on the lower lateral calf area. She says she's been using icy hot rub and heat. It does help some. She has taken Aleve couple of times but says she tries not to take it frequently because it does upset her stomach. She says certain twisting motions like getting out of the car and bending and lifting tends to aggravate it. She's not had this before.  Past medical history, Surgical history, Family history not pertinant except as noted below, Social history, Allergies, and medications have been entered into the medical record, reviewed, and corrections made.   Review of Systems: No fevers, chills, night sweats, weight loss, chest pain, or shortness of breath.   Objective:    General: Well Developed, well nourished, and in no acute distress.  Neuro: Alert and oriented x3, extra-ocular muscles intact, sensation grossly intact.  HEENT: Normocephalic, atraumatic  Skin: Warm and dry, no rashes. Cardiac: Regular rate and rhythm, no murmurs rubs or gallops, no lower extremity edema.  Respiratory: Clear to auscultation bilaterally. Not using accessory muscles, speaking in full sentences. MSK: Lumbar spine with normal flexion extension rotation right and left and side bending. Negative straight leg raise bilaterally. Hip, knee, and strength is 5 out of 5 bilaterally. Patellar reflexes 2+. And over the lumbar spine. Nontender over the SI joints.   Impression and  Recommendations:    Hypertension-well-controlled. Continue current regimen. Follow-up in 6 months.  Hyperlipidemia-due for labs she can go at her convenience. She will need to fast.   Left sciatica of the lumbar spine-given handout on stretches and exercises to do on her own at home. Will send over 5 day course of prednisone that she can fill if she feels like it's not continuing to improve or suddenly gets worse. Can also consider more formal physical therapy and discuss that with her if it's not improving.  Given Pneumovax 23 today.

## 2015-09-16 NOTE — Patient Instructions (Signed)

## 2015-09-17 NOTE — Progress Notes (Signed)
Quick Note:  All labs are normal. ______ 

## 2015-10-17 ENCOUNTER — Other Ambulatory Visit: Payer: Self-pay | Admitting: Family Medicine

## 2015-11-22 ENCOUNTER — Other Ambulatory Visit: Payer: Self-pay | Admitting: Family Medicine

## 2016-02-26 ENCOUNTER — Other Ambulatory Visit: Payer: Self-pay | Admitting: Family Medicine

## 2016-02-26 DIAGNOSIS — Z1231 Encounter for screening mammogram for malignant neoplasm of breast: Secondary | ICD-10-CM

## 2016-03-09 ENCOUNTER — Ambulatory Visit (INDEPENDENT_AMBULATORY_CARE_PROVIDER_SITE_OTHER): Payer: Medicare Other

## 2016-03-09 ENCOUNTER — Other Ambulatory Visit: Payer: Self-pay | Admitting: Family Medicine

## 2016-03-09 DIAGNOSIS — Z1231 Encounter for screening mammogram for malignant neoplasm of breast: Secondary | ICD-10-CM | POA: Diagnosis not present

## 2016-03-18 ENCOUNTER — Ambulatory Visit (INDEPENDENT_AMBULATORY_CARE_PROVIDER_SITE_OTHER): Payer: Medicare Other | Admitting: Family Medicine

## 2016-03-18 ENCOUNTER — Encounter: Payer: Self-pay | Admitting: Family Medicine

## 2016-03-18 VITALS — BP 136/82 | HR 64 | Ht 64.0 in | Wt 140.0 lb

## 2016-03-18 DIAGNOSIS — R42 Dizziness and giddiness: Secondary | ICD-10-CM

## 2016-03-18 DIAGNOSIS — I7300 Raynaud's syndrome without gangrene: Secondary | ICD-10-CM | POA: Diagnosis not present

## 2016-03-18 DIAGNOSIS — I10 Essential (primary) hypertension: Secondary | ICD-10-CM | POA: Diagnosis not present

## 2016-03-18 DIAGNOSIS — Z23 Encounter for immunization: Secondary | ICD-10-CM

## 2016-03-18 MED ORDER — AMLODIPINE BESYLATE 5 MG PO TABS: ORAL_TABLET | ORAL | 1 refills | Status: DC

## 2016-03-18 NOTE — Progress Notes (Signed)
Subjective:    CC: HTN  HPI: Hypertension- Pt denies chest pain, SOB, dizziness, or heart palpitations.  Taking meds as directed w/o problems.  Denies medication side effects.    She's also been complaining about intermittent vertigo. She says it was happening about every 3 days. It was just last for a short period of time would feel like the room was actually spinning. The last time it happened was on Sunday. She also noticed a happening evening when preparing Thanksgiving dinner. She said this last time she got a little nauseated with it but did not vomit. She decided to decrease her amlodipine just to see if it was blood pressure medication. Though, she's been on it for a long time there've been no recent changes. And she did check her blood pressure around the time that she had the vertigo episode and it was in the 130s.  Raynaud's disease-she says it is triggering this morning because she forgot to wear gloves in cold outside. She also recently decreased her amlodipine because she thought it might be causing some dizziness and vertigo.  Past medical history, Surgical history, Family history not pertinant except as noted below, Social history, Allergies, and medications have been entered into the medical record, reviewed, and corrections made.   Review of Systems: No fevers, chills, night sweats, weight loss, chest pain, or shortness of breath.   Objective:    General: Well Developed, well nourished, and in no acute distress.  Neuro: Alert and oriented x3, extra-ocular muscles intact, sensation grossly intact.  HEENT: Normocephalic, atraumatic, Extra movements intact, pupils equal round and reactive to light. TMs and canals are clear bilaterally. Oropharynx is clear. No significant cervical lymphadenopathy. Skin: Warm and dry, no rashes. Cardiac: Regular rate and rhythm, no murmurs rubs or gallops, no lower extremity edema. No carotid bruits Respiratory: Clear to auscultation bilaterally.  Not using accessory muscles, speaking in full sentences. Neuro: Negative Dix-Hallpike maneuver.   Impression and Recommendations:   HTN - Controlled. Continue current regimen. Consider going back up on the amlodipine to a whole tablet I really don't think this is causing her vertigo. Due for BMP.  Vertigo - negative Dix-Hallpike maneuver today but she is also completely asymptomatic today so does not rule out benign positional vertigo. Consider other central disorders. She's not having any other neurologic symptoms at this point in time. If it persisted she continues to have episodes them please let me know and consider further workup and evaluation with possible brain MRI.  Raynaud's disease-encourage her to go back up on her amlodipine so to be likely more effective through the wintertime when she is likely takes parents more symptoms.

## 2016-03-19 LAB — BASIC METABOLIC PANEL
BUN: 22 mg/dL (ref 7–25)
CALCIUM: 9.3 mg/dL (ref 8.6–10.4)
CO2: 26 mmol/L (ref 20–31)
Chloride: 104 mmol/L (ref 98–110)
Creat: 0.89 mg/dL (ref 0.50–0.99)
GLUCOSE: 90 mg/dL (ref 65–99)
Potassium: 4.2 mmol/L (ref 3.5–5.3)
SODIUM: 142 mmol/L (ref 135–146)

## 2016-03-21 NOTE — Progress Notes (Signed)
All labs are normal. 

## 2016-04-10 ENCOUNTER — Other Ambulatory Visit: Payer: Self-pay | Admitting: Family Medicine

## 2016-05-16 ENCOUNTER — Other Ambulatory Visit: Payer: Self-pay | Admitting: Family Medicine

## 2016-06-02 ENCOUNTER — Telehealth: Payer: Self-pay | Admitting: Family Medicine

## 2016-06-02 NOTE — Telephone Encounter (Signed)
Call pt: would she be willing to change her simvastatin to atorvastatin.  There are less risk of muscle aches with the atorvastatin since she also takes the amlodipine.   Beatrice Lecher, MD

## 2016-06-03 MED ORDER — ATORVASTATIN CALCIUM 40 MG PO TABS: 40.0000 mg | ORAL_TABLET | Freq: Every day | ORAL | 3 refills | Status: DC

## 2016-06-03 NOTE — Telephone Encounter (Signed)
Left VM for Pt to return clinic call regarding recommendation, callback information provided.

## 2016-06-03 NOTE — Telephone Encounter (Signed)
Patient agreed to switch to atorvastatin.

## 2016-06-03 NOTE — Telephone Encounter (Signed)
New prescription sent to pharmacy 

## 2016-06-05 NOTE — Telephone Encounter (Signed)
Left message advising that the prescription for atorvastatin has been sent.

## 2016-06-06 ENCOUNTER — Other Ambulatory Visit: Payer: Self-pay | Admitting: Family Medicine

## 2016-06-08 DIAGNOSIS — H43812 Vitreous degeneration, left eye: Secondary | ICD-10-CM | POA: Diagnosis not present

## 2016-07-31 ENCOUNTER — Other Ambulatory Visit: Payer: Self-pay | Admitting: Family Medicine

## 2016-09-09 ENCOUNTER — Encounter: Payer: Self-pay | Admitting: Family Medicine

## 2016-09-09 ENCOUNTER — Ambulatory Visit (INDEPENDENT_AMBULATORY_CARE_PROVIDER_SITE_OTHER): Payer: Medicare Other | Admitting: Family Medicine

## 2016-09-09 VITALS — BP 131/71 | HR 52 | Ht 63.39 in | Wt 138.0 lb

## 2016-09-09 DIAGNOSIS — I10 Essential (primary) hypertension: Secondary | ICD-10-CM | POA: Diagnosis not present

## 2016-09-09 DIAGNOSIS — E7800 Pure hypercholesterolemia, unspecified: Secondary | ICD-10-CM | POA: Diagnosis not present

## 2016-09-09 DIAGNOSIS — Z23 Encounter for immunization: Secondary | ICD-10-CM

## 2016-09-09 MED ORDER — AMLODIPINE BESYLATE 5 MG PO TABS: ORAL_TABLET | ORAL | 1 refills | Status: DC

## 2016-09-09 MED ORDER — METOPROLOL SUCCINATE ER 25 MG PO TB24: ORAL_TABLET | ORAL | 1 refills | Status: DC

## 2016-09-09 NOTE — Progress Notes (Addendum)
Subjective:    CC: HTN  HPI: Hypertension- Pt denies chest pain, SOB, dizziness, or heart palpitations.  Taking meds as directed w/o problems.  Denies medication side effects.     Hyperlipidemia - Tolerating statin well w/o Side effects or myalgias.    Has some questions about shingles vaccine.  She has had Zostavax.   Sciatica-her sciatica has been flaring a little bit more socially when she picks up her grandbaby frequently. Plus she hasn't been able to make it to the gym as regularly but starting this fall he will actually go to daycare 2 half days a week and says she be able to get back into the gym.  Past medical history, Surgical history, Family history not pertinant except as noted below, Social history, Allergies, and medications have been entered into the medical record, reviewed, and corrections made.   Review of Systems: No fevers, chills, night sweats, weight loss, chest pain, or shortness of breath.   Objective:    General: Well Developed, well nourished, and in no acute distress.  Neuro: Alert and oriented x3, extra-ocular muscles intact, sensation grossly intact.  HEENT: Normocephalic, atraumatic  Skin: Warm and dry, no rashes. Cardiac: Regular rate and rhythm, no murmurs rubs or gallops, no lower extremity edema.  Respiratory: Clear to auscultation bilaterally. Not using accessory muscles, speaking in full sentences.   Impression and Recommendations:    HTN - Well controlled. Continue current regimen. Follow up in  6 months.   Hyperlipidemia - Due for lipids.   Discussed cologuard.  Form completed and faxed.  We also discussed need for shingles vaccine. I did recommend that she get the updated version as it is more effective. And we can repeat this in 6 months.

## 2016-09-12 DIAGNOSIS — I10 Essential (primary) hypertension: Secondary | ICD-10-CM | POA: Diagnosis not present

## 2016-09-12 DIAGNOSIS — E7800 Pure hypercholesterolemia, unspecified: Secondary | ICD-10-CM | POA: Diagnosis not present

## 2016-09-13 LAB — COMPLETE METABOLIC PANEL WITH GFR
ALT: 19 U/L (ref 6–29)
AST: 21 U/L (ref 10–35)
Albumin: 4.3 g/dL (ref 3.6–5.1)
Alkaline Phosphatase: 51 U/L (ref 33–130)
BUN: 20 mg/dL (ref 7–25)
CALCIUM: 9.3 mg/dL (ref 8.6–10.4)
CHLORIDE: 105 mmol/L (ref 98–110)
CO2: 23 mmol/L (ref 20–31)
Creat: 0.86 mg/dL (ref 0.50–0.99)
GFR, EST AFRICAN AMERICAN: 81 mL/min (ref >=60)
GFR, EST NON AFRICAN AMERICAN: 70 mL/min (ref >=60)
Glucose, Bld: 98 mg/dL (ref 65–99)
POTASSIUM: 4.2 mmol/L (ref 3.5–5.3)
Sodium: 139 mmol/L (ref 135–146)
Total Bilirubin: 0.7 mg/dL (ref 0.2–1.2)
Total Protein: 6.6 g/dL (ref 6.1–8.1)

## 2016-09-13 LAB — LIPID PANEL W/REFLEX DIRECT LDL
CHOL/HDL RATIO: 1.6 ratio (ref <5.0)
CHOLESTEROL: 172 mg/dL (ref <200)
HDL: 108 mg/dL (ref >50)
LDL-Cholesterol: 51 mg/dL
NON-HDL CHOLESTEROL (CALC): 64 mg/dL (ref <130)
TRIGLYCERIDES: 54 mg/dL (ref <150)

## 2016-09-13 NOTE — Progress Notes (Signed)
All labs are normal. 

## 2016-11-28 DIAGNOSIS — Z23 Encounter for immunization: Secondary | ICD-10-CM | POA: Diagnosis not present

## 2016-12-16 ENCOUNTER — Telehealth: Payer: Self-pay | Admitting: Family Medicine

## 2016-12-16 ENCOUNTER — Ambulatory Visit: Payer: Medicare Other | Admitting: Family Medicine

## 2016-12-16 NOTE — Progress Notes (Deleted)
Subjective:    CC: HTN, cholesterols   HPI:  Hypertension- Pt denies chest pain, SOB, dizziness, or heart palpitations.  Taking meds as directed w/o problems.  Denies medication side effects.    Hyperlipidemia -  Lab Results  Component Value Date   CHOL 172 09/12/2016   HDL 108 09/12/2016   LDLCALC 68 09/16/2015   TRIG 54 09/12/2016   CHOLHDL 1.6 09/12/2016     Past medical history, Surgical history, Family history not pertinant except as noted below, Social history, Allergies, and medications have been entered into the medical record, reviewed, and corrections made.   Review of Systems: No fevers, chills, night sweats, weight loss, chest pain, or shortness of breath.   Objective:    General: Well Developed, well nourished, and in no acute distress.  Neuro: Alert and oriented x3, extra-ocular muscles intact, sensation grossly intact.  HEENT: Normocephalic, atraumatic  Skin: Warm and dry, no rashes. Cardiac: Regular rate and rhythm, no murmurs rubs or gallops, no lower extremity edema.  Respiratory: Clear to auscultation bilaterally. Not using accessory muscles, speaking in full sentences.   Impression and Recommendations:    HTN -   Hyperlipidemia -

## 2016-12-25 DIAGNOSIS — Z1211 Encounter for screening for malignant neoplasm of colon: Secondary | ICD-10-CM | POA: Diagnosis not present

## 2016-12-25 DIAGNOSIS — Z1212 Encounter for screening for malignant neoplasm of rectum: Secondary | ICD-10-CM | POA: Diagnosis not present

## 2017-01-04 ENCOUNTER — Telehealth: Payer: Self-pay | Admitting: Family Medicine

## 2017-01-04 DIAGNOSIS — R195 Other fecal abnormalities: Secondary | ICD-10-CM

## 2017-01-04 NOTE — Telephone Encounter (Signed)
Call patient: Her color guard test was positive. This does not necessarily mean that she has colon cancer but does mean that she needs further evaluation to see if she could have some abnormalities such as abnormal polyps etc. Next step would be for her to have a full colonoscopy. Order placed. Please see if she has a preference for location.

## 2017-01-05 NOTE — Telephone Encounter (Signed)
Please select in the order and sign

## 2017-01-05 NOTE — Telephone Encounter (Signed)
She would like to go to [facility] in [place].

## 2017-01-05 NOTE — Telephone Encounter (Signed)
Left message to call the office back.

## 2017-01-05 NOTE — Telephone Encounter (Signed)
Order placed

## 2017-01-13 DIAGNOSIS — R195 Other fecal abnormalities: Secondary | ICD-10-CM | POA: Diagnosis not present

## 2017-01-21 ENCOUNTER — Other Ambulatory Visit: Payer: Self-pay | Admitting: Family Medicine

## 2017-01-23 DIAGNOSIS — D122 Benign neoplasm of ascending colon: Secondary | ICD-10-CM | POA: Diagnosis not present

## 2017-01-23 DIAGNOSIS — R195 Other fecal abnormalities: Secondary | ICD-10-CM | POA: Diagnosis not present

## 2017-01-23 LAB — HM COLONOSCOPY

## 2017-02-10 ENCOUNTER — Other Ambulatory Visit: Payer: Self-pay | Admitting: Family Medicine

## 2017-02-10 DIAGNOSIS — Z1231 Encounter for screening mammogram for malignant neoplasm of breast: Secondary | ICD-10-CM

## 2017-02-24 ENCOUNTER — Other Ambulatory Visit: Payer: Self-pay | Admitting: *Deleted

## 2017-02-24 DIAGNOSIS — E7800 Pure hypercholesterolemia, unspecified: Secondary | ICD-10-CM

## 2017-02-24 MED ORDER — ATORVASTATIN CALCIUM 40 MG PO TABS: 40.0000 mg | ORAL_TABLET | Freq: Every day | ORAL | 3 refills | Status: DC

## 2017-03-10 ENCOUNTER — Ambulatory Visit (INDEPENDENT_AMBULATORY_CARE_PROVIDER_SITE_OTHER): Payer: Medicare Other

## 2017-03-10 DIAGNOSIS — Z1231 Encounter for screening mammogram for malignant neoplasm of breast: Secondary | ICD-10-CM

## 2017-03-16 ENCOUNTER — Other Ambulatory Visit: Payer: Self-pay

## 2017-03-17 ENCOUNTER — Encounter: Payer: Self-pay | Admitting: Family Medicine

## 2017-03-17 ENCOUNTER — Ambulatory Visit (INDEPENDENT_AMBULATORY_CARE_PROVIDER_SITE_OTHER): Payer: Medicare Other | Admitting: Family Medicine

## 2017-03-17 VITALS — BP 139/67 | HR 61 | Ht 63.0 in | Wt 138.0 lb

## 2017-03-17 DIAGNOSIS — I7300 Raynaud's syndrome without gangrene: Secondary | ICD-10-CM

## 2017-03-17 DIAGNOSIS — M2062 Acquired deformities of toe(s), unspecified, left foot: Secondary | ICD-10-CM | POA: Diagnosis not present

## 2017-03-17 DIAGNOSIS — R42 Dizziness and giddiness: Secondary | ICD-10-CM | POA: Diagnosis not present

## 2017-03-17 DIAGNOSIS — Z23 Encounter for immunization: Secondary | ICD-10-CM | POA: Diagnosis not present

## 2017-03-17 DIAGNOSIS — I10 Essential (primary) hypertension: Secondary | ICD-10-CM | POA: Diagnosis not present

## 2017-03-17 MED ORDER — AMLODIPINE BESYLATE 5 MG PO TABS: ORAL_TABLET | ORAL | 1 refills | Status: DC

## 2017-03-17 MED ORDER — METOPROLOL SUCCINATE ER 25 MG PO TB24: ORAL_TABLET | ORAL | 1 refills | Status: DC

## 2017-03-17 NOTE — Progress Notes (Signed)
Subjective:    Patient ID: Maria Romero, female    DOB: 02-08-1951, 66 y.o.   MRN: [id number]  HPI Hypertension- Pt denies chest pain, SOB, dizziness, or heart palpitations.  Taking meds as directed w/o problems.  Denies medication side effects.    She did have a positive Cologuard and went for full colonoscopy.  They did remove a polyp.  She is due to come back in 5 years.  Did want to let me know about an episode that she had a couple of weeks ago.  She suddenly felt lightheaded, no room spinning.  And then broke out into a sweat.  She then had diarrhea about 5 times and then actually vomited.  She also had some chills.  She had eaten that day but always prepares her own food.  They resolved by the next day.  She has not had it happen again.  He has had some lightheadedness episodes in the past year may be a couple of them but those were more room spinning type episodes.  gets intermittent ear ringing.  She is doing okay with her Raynaud's.  She is up to a full tab of the amlodipine.  She tends to get more symptomatic, October when the weather starts to cool off.  She also wanted to let me know that back in the summer when of the dogs hit her second toe on her left foot and she thinks it was fractured.  It has healed but has a little bit of deformity and is unable to fully flex and she just wanted me to look at it today.  Review of Systems   BP 139/67   Pulse 61   Ht 5\' 3"  (1.6 m)   Wt 138 lb (62.6 kg)   SpO2 100%   BMI 24.45 kg/m     Allergies  Allergen Reactions  . Fluvastatin Sodium     REACTION: Myalgias    Past Medical History:  Diagnosis Date  . Hypercholesterolemia   . IBS (irritable bowel syndrome)   . Osteopenia     Past Surgical History:  Procedure Laterality Date  . lump removal  1995  . TONSILLECTOMY    . TUBAL LIGATION  1990    Social History   Socioeconomic History  . Marital status: Widowed    Spouse name: Not on file  . Number of children:  Not on file  . Years of education: Not on file  . Highest education level: Not on file  Social Needs  . Financial resource strain: Not on file  . Food insecurity - worry: Not on file  . Food insecurity - inability: Not on file  . Transportation needs - medical: Not on file  . Transportation needs - non-medical: Not on file  Occupational History  . Occupation: retired Quarry manager  Tobacco Use  . Smoking status: Former Smoker    Types: Cigarettes    Last attempt to quit: 03/20/1990    Years since quitting: 27.0  . Smokeless tobacco: Never Used  . Tobacco comment: quit 1988  Substance and Sexual Activity  . Alcohol use: Yes    Alcohol/week: 0.6 - 1.2 oz    Types: 1 - 2 Glasses of wine per week    Comment: Wine in the evenings  . Drug use: No  . Sexual activity: No    Partners: Male  Other Topics Concern  . Not on file  Social History Narrative   Regular exercise. Helps take care of her  grandson.     Family History  Problem Relation Age of Onset  . Stroke Mother   . Hypertension Mother   . Hyperlipidemia Mother   . Glaucoma Mother   . Heart attack Father 54  . Diabetes Father   . Hypertension Father   . Hyperlipidemia Father   . Cataracts Father   . Breast cancer Cousin     Outpatient Encounter Medications as of 03/17/2017  Medication Sig  . amLODipine (NORVASC) 5 MG tablet TAKE 0.5-1 TABLET BY MOUTH DAILY.  Marland Kitchen aspirin (ADULT ASPIRIN EC LOW STRENGTH) 81 MG EC tablet Take 81 mg by mouth daily.    Marland Kitchen atorvastatin (LIPITOR) 40 MG tablet Take 1 tablet (40 mg total) daily by mouth.  . metoprolol succinate (TOPROL-XL) 25 MG 24 hr tablet TAKE 1 TABLET (25 MG TOTAL) BY MOUTH DAILY.  Marland Kitchen Omega-3 Fatty Acids (FISH OIL MAXIMUM STRENGTH) 1200 MG CAPS Take 1 capsule by mouth 2 (two) times daily.   . [DISCONTINUED] amLODipine (NORVASC) 5 MG tablet TAKE 0.5-1 TABLET BY MOUTH DAILY.  . [DISCONTINUED] metoprolol succinate (TOPROL-XL) 25 MG 24 hr tablet TAKE 1 TABLET (25 MG TOTAL) BY MOUTH  DAILY.  . [DISCONTINUED] Diclofenac Sodium (PENNSAID) 2 % SOLN One packet to dorsal foot PRN BID.   No facility-administered encounter medications on file as of 03/17/2017.           Objective:   Physical Exam  Constitutional: She is oriented to person, place, and time. She appears well-developed and well-nourished.  HENT:  Head: Normocephalic and atraumatic.  Cardiovascular: Normal rate, regular rhythm and normal heart sounds.  Pulmonary/Chest: Effort normal and breath sounds normal.  Musculoskeletal:  Second toe on left foot has some hypertrophy along the mid toe.   Neurological: She is alert and oriented to person, place, and time.  Skin: Skin is warm and dry.  Psychiatric: She has a normal mood and affect. Her behavior is normal.        Assessment & Plan:  HTN  - Well controlled. Continue current regimen. Follow up in  6 months.    Lightheadedness associated with diarrhea and vomiting-it sounds like she may have had some type of gastroenteritis.  I do not really think this was related to her blood pressure medication at all.  She has not had any recurrence which is reassuring.  Left foot second toe fracture/deformity-at this point it looks like it has healed even though it does have some deformity.  I do not think any other workup will make a difference.  She still gets some occasional pain and she has some stiffness and is unable to fully flex it.  Due for 5 year f/u for colonoscopy.    Raynauds -able.  Continue with full dose of amlodipine.

## 2017-03-18 LAB — BASIC METABOLIC PANEL WITH GFR
BUN: 18 mg/dL (ref 7–25)
CALCIUM: 10.1 mg/dL (ref 8.6–10.4)
CHLORIDE: 105 mmol/L (ref 98–110)
CO2: 28 mmol/L (ref 20–32)
Creat: 0.94 mg/dL (ref 0.50–0.99)
GFR, Est African American: 72 mL/min/{1.73_m2} (ref >=60)
GFR, Est Non African American: 62 mL/min/{1.73_m2} (ref >=60)
GLUCOSE: 96 mg/dL (ref 65–139)
Potassium: 4.3 mmol/L (ref 3.5–5.3)
Sodium: 143 mmol/L (ref 135–146)

## 2017-03-18 LAB — CBC
HCT: 40.6 % (ref 35.0–45.0)
HEMOGLOBIN: 13.9 g/dL (ref 11.7–15.5)
MCH: 31.4 pg (ref 27.0–33.0)
MCHC: 34.2 g/dL (ref 32.0–36.0)
MCV: 91.9 fL (ref 80.0–100.0)
MPV: 10.9 fL (ref 7.5–12.5)
Platelets: 257 10*3/uL (ref 140–400)
RBC: 4.42 10*6/uL (ref 3.80–5.10)
RDW: 11.8 % (ref 11.0–15.0)
WBC: 6.6 10*3/uL (ref 3.8–10.8)

## 2017-03-18 LAB — HEMOGLOBIN A1C
EAG (MMOL/L): 5.8 (calc)
Hgb A1c MFr Bld: 5.3 % of total Hgb (ref <5.7)
Mean Plasma Glucose: 105 (calc)

## 2017-03-18 LAB — TSH: TSH: 2.95 m[IU]/L (ref 0.40–4.50)

## 2017-04-12 NOTE — Telephone Encounter (Signed)
Error

## 2017-06-26 DIAGNOSIS — H43812 Vitreous degeneration, left eye: Secondary | ICD-10-CM | POA: Diagnosis not present

## 2017-09-03 ENCOUNTER — Other Ambulatory Visit: Payer: Self-pay | Admitting: Family Medicine

## 2017-09-03 DIAGNOSIS — I10 Essential (primary) hypertension: Secondary | ICD-10-CM

## 2017-09-15 ENCOUNTER — Encounter: Payer: Self-pay | Admitting: Family Medicine

## 2017-09-15 ENCOUNTER — Ambulatory Visit (INDEPENDENT_AMBULATORY_CARE_PROVIDER_SITE_OTHER): Payer: Medicare Other | Admitting: Family Medicine

## 2017-09-15 VITALS — BP 132/62 | HR 50 | Ht 63.0 in | Wt 137.0 lb

## 2017-09-15 DIAGNOSIS — M25531 Pain in right wrist: Secondary | ICD-10-CM

## 2017-09-15 DIAGNOSIS — E7800 Pure hypercholesterolemia, unspecified: Secondary | ICD-10-CM | POA: Diagnosis not present

## 2017-09-15 DIAGNOSIS — I10 Essential (primary) hypertension: Secondary | ICD-10-CM | POA: Diagnosis not present

## 2017-09-15 NOTE — Progress Notes (Signed)
Subjective:    CC: BP check   HPI:  Hypertension- Pt denies chest pain, SOB, dizziness, or heart palpitations.  Taking meds as directed w/o problems.  Denies medication side effects.    Hyperlipidemia -currently on atorvastatin 40 mg daily tolerating it well without any side effects.    She also complains of some right lateral wrist pain.  Is been going on for several weeks at this point.  Is more on the outside of the wrist.  She notices it more with lifting things.  She has taken Aleve a couple of times for discomfort.  She also feels like it may be a little bit more swollen in that area.  Past medical history, Surgical history, Family history not pertinant except as noted below, Social history, Allergies, and medications have been entered into the medical record, reviewed, and corrections made.   Review of Systems: No fevers, chills, night sweats, weight loss, chest pain, or shortness of breath.   Objective:    General: Well Developed, well nourished, and in no acute distress.  Neuro: Alert and oriented x3, extra-ocular muscles intact, sensation grossly intact.  HEENT: Normocephalic, atraumatic  Skin: Warm and dry, no rashes. Cardiac: Regular rate and rhythm, no murmurs rubs or gallops, no lower extremity edema.  Respiratory: Clear to auscultation bilaterally. Not using accessory muscles, speaking in full sentences. MSK: Left wrist with normal flexion extension inversion and eversion.  She is nontender directly over the wrist.  Most of her pain is just slightly anterior and lateral over the tendon.   Impression and Recommendations:    HTN - Well controlled. Continue current regimen. Follow up in  75months.    Hyperlipidemia -continue with Lipitor.  Due for repeat lipid panel.  Right lateral wrist pain-suspect tendinitis based on exam and history.  Recommend a cock-up splint.  She will try buying one over-the-counter.  In fact she is actually getting ready to go on vacation and she  is can to see if it just gets better since she will be doing very light activities and if it does not know when she gets back she will buy one over-the-counter and if that does not work after 3 weeks of use then encouraged her to follow with 1 of our sports medicine providers.

## 2017-09-18 DIAGNOSIS — I10 Essential (primary) hypertension: Secondary | ICD-10-CM | POA: Diagnosis not present

## 2017-09-18 DIAGNOSIS — E7800 Pure hypercholesterolemia, unspecified: Secondary | ICD-10-CM | POA: Diagnosis not present

## 2017-09-18 LAB — LIPID PANEL
Cholesterol: 186 mg/dL (ref <200)
HDL: 98 mg/dL (ref >50)
LDL Cholesterol (Calc): 74 mg/dL (calc)
Non-HDL Cholesterol (Calc): 88 mg/dL (calc) (ref <130)
Total CHOL/HDL Ratio: 1.9 (calc) (ref <5.0)
Triglycerides: 67 mg/dL (ref <150)

## 2017-09-18 LAB — COMPLETE METABOLIC PANEL WITH GFR
AG Ratio: 2.1 (calc) (ref 1.0–2.5)
ALT: 18 U/L (ref 6–29)
AST: 20 U/L (ref 10–35)
Albumin: 4.7 g/dL (ref 3.6–5.1)
Alkaline phosphatase (APISO): 56 U/L (ref 33–130)
BILIRUBIN TOTAL: 0.8 mg/dL (ref 0.2–1.2)
BUN: 17 mg/dL (ref 7–25)
CO2: 29 mmol/L (ref 20–32)
CREATININE: 0.89 mg/dL (ref 0.50–0.99)
Calcium: 10 mg/dL (ref 8.6–10.4)
Chloride: 105 mmol/L (ref 98–110)
GFR, EST AFRICAN AMERICAN: 77 mL/min/{1.73_m2} (ref >=60)
GFR, EST NON AFRICAN AMERICAN: 67 mL/min/{1.73_m2} (ref >=60)
GLUCOSE: 108 mg/dL — AB (ref 65–99)
Globulin: 2.2 g/dL (calc) (ref 1.9–3.7)
Potassium: 4.9 mmol/L (ref 3.5–5.3)
Sodium: 142 mmol/L (ref 135–146)
TOTAL PROTEIN: 6.9 g/dL (ref 6.1–8.1)

## 2017-09-19 NOTE — Progress Notes (Signed)
Pt advised..Maria Romero, CMA  

## 2017-09-19 NOTE — Progress Notes (Signed)
All labs are normal. 

## 2018-01-25 ENCOUNTER — Ambulatory Visit (INDEPENDENT_AMBULATORY_CARE_PROVIDER_SITE_OTHER): Payer: Medicare Other | Admitting: Family Medicine

## 2018-01-25 DIAGNOSIS — Z23 Encounter for immunization: Secondary | ICD-10-CM

## 2018-02-09 ENCOUNTER — Other Ambulatory Visit: Payer: Self-pay | Admitting: Family Medicine

## 2018-02-09 DIAGNOSIS — Z1231 Encounter for screening mammogram for malignant neoplasm of breast: Secondary | ICD-10-CM

## 2018-02-25 ENCOUNTER — Other Ambulatory Visit: Payer: Self-pay | Admitting: Family Medicine

## 2018-02-25 DIAGNOSIS — I10 Essential (primary) hypertension: Secondary | ICD-10-CM

## 2018-02-27 ENCOUNTER — Other Ambulatory Visit: Payer: Self-pay

## 2018-03-10 ENCOUNTER — Other Ambulatory Visit: Payer: Self-pay | Admitting: Family Medicine

## 2018-03-10 DIAGNOSIS — E7800 Pure hypercholesterolemia, unspecified: Secondary | ICD-10-CM

## 2018-03-14 ENCOUNTER — Ambulatory Visit (INDEPENDENT_AMBULATORY_CARE_PROVIDER_SITE_OTHER): Payer: Medicare Other

## 2018-03-14 DIAGNOSIS — Z1231 Encounter for screening mammogram for malignant neoplasm of breast: Secondary | ICD-10-CM | POA: Diagnosis not present

## 2018-03-19 ENCOUNTER — Encounter: Payer: Self-pay | Admitting: Family Medicine

## 2018-03-19 ENCOUNTER — Ambulatory Visit (INDEPENDENT_AMBULATORY_CARE_PROVIDER_SITE_OTHER): Payer: Medicare Other | Admitting: Family Medicine

## 2018-03-19 VITALS — BP 137/65 | HR 54 | Ht 63.09 in | Wt 145.0 lb

## 2018-03-19 DIAGNOSIS — M85859 Other specified disorders of bone density and structure, unspecified thigh: Secondary | ICD-10-CM | POA: Diagnosis not present

## 2018-03-19 DIAGNOSIS — I10 Essential (primary) hypertension: Secondary | ICD-10-CM

## 2018-03-19 DIAGNOSIS — Z780 Asymptomatic menopausal state: Secondary | ICD-10-CM

## 2018-03-19 DIAGNOSIS — R7309 Other abnormal glucose: Secondary | ICD-10-CM

## 2018-03-19 DIAGNOSIS — M8580 Other specified disorders of bone density and structure, unspecified site: Secondary | ICD-10-CM

## 2018-03-19 LAB — POCT GLYCOSYLATED HEMOGLOBIN (HGB A1C): Hemoglobin A1C: 5.2 % (ref 4.0–5.6)

## 2018-03-19 NOTE — Progress Notes (Signed)
Subjective:    CC: BP check  HPI: Hypertension- Pt denies chest pain, SOB, dizziness, or heart palpitations.  Taking meds as directed w/o problems.  Denies medication side effects.    Elevated Glucose - last glucose was high on labs this summer. She was fasting.    Follow-up osteopenia-last bone density was in 2013 with a T score of -2.0.  She is not currently on any calcium or vitamin D supplement.  Past medical history, Surgical history, Family history not pertinant except as noted below, Social history, Allergies, and medications have been entered into the medical record, reviewed, and corrections made.   Review of Systems: No fevers, chills, night sweats, weight loss, chest pain, or shortness of breath.   Objective:    General: Well Developed, well nourished, and in no acute distress.  Neuro: Alert and oriented x3, extra-ocular muscles intact, sensation grossly intact.  HEENT: Normocephalic, atraumatic  Skin: Warm and dry, no rashes. Cardiac: Regular rate and rhythm, no murmurs rubs or gallops, no lower extremity edema.  Respiratory: Clear to auscultation bilaterally. Not using accessory muscles, speaking in full sentences.   Impression and Recommendations:    HTN - Well controlled. Continue current regimen. Follow up in  6 months.    Elevated Glucose - A1C is normal.    Osteopenia -due to repeat bone density.  Did encourage her to make sure she is getting adequate calcium and vitamin D intake either through her diet or supplement.

## 2018-03-28 ENCOUNTER — Ambulatory Visit (INDEPENDENT_AMBULATORY_CARE_PROVIDER_SITE_OTHER): Payer: Medicare Other

## 2018-03-28 DIAGNOSIS — M8580 Other specified disorders of bone density and structure, unspecified site: Secondary | ICD-10-CM

## 2018-03-28 DIAGNOSIS — Z780 Asymptomatic menopausal state: Secondary | ICD-10-CM

## 2018-03-28 DIAGNOSIS — M85859 Other specified disorders of bone density and structure, unspecified thigh: Secondary | ICD-10-CM

## 2018-03-28 DIAGNOSIS — M8589 Other specified disorders of bone density and structure, multiple sites: Secondary | ICD-10-CM | POA: Diagnosis not present

## 2018-03-28 DIAGNOSIS — M810 Age-related osteoporosis without current pathological fracture: Secondary | ICD-10-CM

## 2018-03-30 ENCOUNTER — Other Ambulatory Visit: Payer: Self-pay

## 2018-03-30 DIAGNOSIS — M85859 Other specified disorders of bone density and structure, unspecified thigh: Secondary | ICD-10-CM

## 2018-04-02 ENCOUNTER — Telehealth: Payer: Self-pay | Admitting: *Deleted

## 2018-04-02 DIAGNOSIS — M85859 Other specified disorders of bone density and structure, unspecified thigh: Secondary | ICD-10-CM | POA: Diagnosis not present

## 2018-04-02 NOTE — Telephone Encounter (Signed)
Information has been faxed to insurance to see if PA is required. Waiting on response.

## 2018-04-02 NOTE — Telephone Encounter (Signed)
Pt came by the office and wanted to get prescription for Prolia. She is asking about whether or not this will be covered. I informed her that most of our pt's that take this injection have not had any issues w/medicare covering the cost of this medication. She will come back in to have labs drawn.Maryruth Eve, Lahoma Crocker, CMA

## 2018-04-03 LAB — COMPLETE METABOLIC PANEL WITH GFR
AG Ratio: 2.1 (calc) (ref 1.0–2.5)
ALBUMIN MSPROF: 4.6 g/dL (ref 3.6–5.1)
ALT: 21 U/L (ref 6–29)
AST: 22 U/L (ref 10–35)
Alkaline phosphatase (APISO): 55 U/L (ref 33–130)
BUN: 17 mg/dL (ref 7–25)
CO2: 27 mmol/L (ref 20–32)
Calcium: 9.9 mg/dL (ref 8.6–10.4)
Chloride: 103 mmol/L (ref 98–110)
Creat: 0.93 mg/dL (ref 0.50–0.99)
GFR, Est African American: 73 mL/min/{1.73_m2} (ref >=60)
GFR, Est Non African American: 63 mL/min/{1.73_m2} (ref >=60)
GLUCOSE: 107 mg/dL — AB (ref 65–99)
Globulin: 2.2 g/dL (calc) (ref 1.9–3.7)
Potassium: 4.4 mmol/L (ref 3.5–5.3)
Sodium: 140 mmol/L (ref 135–146)
Total Bilirubin: 0.6 mg/dL (ref 0.2–1.2)
Total Protein: 6.8 g/dL (ref 6.1–8.1)

## 2018-04-05 NOTE — Progress Notes (Signed)
All labs are normal. 

## 2018-04-13 NOTE — Telephone Encounter (Signed)
Patient wants to wait a month or two before getting the injection. She will call back to schedule a nurse visit.

## 2018-04-13 NOTE — Telephone Encounter (Signed)
Noted  

## 2018-04-13 NOTE — Telephone Encounter (Signed)
I will order one to have to her. She will need repeat labs since she is opting to wait.

## 2018-04-13 NOTE — Telephone Encounter (Signed)
I did the benefits for this patient and PA is not required. Do we have one in the refrigerator to schedule her for a nurse visit? She had a CMP on 04/02/2018 and was normal. Please advise.

## 2018-04-14 ENCOUNTER — Other Ambulatory Visit: Payer: Self-pay

## 2018-04-14 ENCOUNTER — Emergency Department (INDEPENDENT_AMBULATORY_CARE_PROVIDER_SITE_OTHER)
Admission: EM | Admit: 2018-04-14 | Discharge: 2018-04-14 | Disposition: A | Discharge status: Home / Self Care | Payer: Medicare Other | Source: Home / Self Care | Attending: Internal Medicine | Admitting: Internal Medicine

## 2018-04-14 DIAGNOSIS — J40 Bronchitis, not specified as acute or chronic: Secondary | ICD-10-CM | POA: Diagnosis not present

## 2018-04-14 DIAGNOSIS — B309 Viral conjunctivitis, unspecified: Secondary | ICD-10-CM

## 2018-04-14 MED ORDER — HYDROCOD POLST-CPM POLST ER 10-8 MG/5ML PO SUER: 5.0000 mL | Freq: Every evening | ORAL | 0 refills | Status: AC | PRN

## 2018-04-14 MED ORDER — POLYMYXIN B-TRIMETHOPRIM 10000-0.1 UNIT/ML-% OP SOLN: 1.0000 [drp] | OPHTHALMIC | 0 refills | Status: AC

## 2018-04-14 MED ORDER — PREDNISONE 10 MG (21) PO TBPK: ORAL_TABLET | Freq: Every day | ORAL | 0 refills | Status: DC

## 2018-04-14 MED ORDER — AZITHROMYCIN 250 MG PO TABS: 250.0000 mg | ORAL_TABLET | Freq: Every day | ORAL | 0 refills | Status: DC

## 2018-04-14 NOTE — ED Provider Notes (Signed)
[facility]    CSN: [id number] Arrival date & time: 04/14/18  1304     History   Chief Complaint Chief Complaint  Patient presents with  . Conjunctivitis  . Cough    HPI Maria Romero is a 68 y.o. female.   68 year old female with past medical history of osteopenia and irritable bowel indrawn presents to urgent care complaining of cough and conjunctivitis.  The latter started this morning.  Her grandson has had conjunctivitis and she has kept him the last few days.  Her cough has been present for 8 days.  It is rattling but nonproductive.  She also complains of a scratchy throat.  The patient denies fever, nausea, vomiting diarrhea or shortness of breath.     Past Medical History:  Diagnosis Date  . Hypercholesterolemia   . IBS (irritable bowel syndrome)   . Osteopenia     Patient Active Problem List   Diagnosis Date Noted  . Essential hypertension 09/16/2015  . Raynaud disease 02/09/2015  . Bradycardia 08/25/2009  . PALPITATIONS 08/25/2009  . DYSPNEA 08/25/2009  . Abnormal levels of other serum enzymes 11/13/2007  . OSTEOPENIA 05/22/2007  . HYPERCHOLESTEROLEMIA 05/21/2007  . IBS 05/21/2007  . LEG PAIN, LEFT 05/21/2007    Past Surgical History:  Procedure Laterality Date  . lump removal  1995  . TONSILLECTOMY    . TUBAL LIGATION  1990    OB History    Gravida  1   Para  1   Term  1   Preterm      AB      Living  1     SAB      TAB      Ectopic      Multiple      Live Births               Home Medications    Prior to Admission medications   Medication Sig Start Date End Date Taking? Authorizing Provider  amLODipine (NORVASC) 5 MG tablet TAKE 0.5-1 TABLET BY MOUTH DAILY. 02/26/18   Hali Marry, MD  aspirin (ADULT ASPIRIN EC LOW STRENGTH) 81 MG EC tablet Take 81 mg by mouth daily.      [provider]  atorvastatin (LIPITOR) 40 MG tablet TAKE 1 TABLET (40 MG TOTAL) DAILY BY MOUTH. 03/12/18    Hali Marry, MD  azithromycin (ZITHROMAX) 250 MG tablet Take 1 tablet (250 mg total) by mouth daily. Take first 2 tablets together, then 1 every day until finished. 04/14/18   Harrie Foreman, MD  Calcium Carb-Cholecalciferol (CALCIUM 1000 + D PO) Take 1 tablet by mouth daily.    [provider]  chlorpheniramine-HYDROcodone (TUSSIONEX PENNKINETIC ER) 10-8 MG/5ML SUER Take 5 mLs by mouth at bedtime as needed for up to 14 days for cough. 04/14/18 04/28/18  Harrie Foreman, MD  metoprolol succinate (TOPROL-XL) 25 MG 24 hr tablet TAKE 1 TABLET BY MOUTH EVERY DAY 02/26/18   Hali Marry, MD  Omega-3 Fatty Acids (FISH OIL MAXIMUM STRENGTH) 1200 MG CAPS Take 1 capsule by mouth 2 (two) times daily.     [provider]  predniSONE (STERAPRED UNI-PAK 21 TAB) 10 MG (21) TBPK tablet Take by mouth daily. Take 6 tabs by mouth daily  for 2 days, then 5 tabs for 2 days, then 4 tabs for 2 days, then 3 tabs for 2 days, 2 tabs for 2 days, then 1 tab by mouth daily for 2 days  04/14/18   Harrie Foreman, MD  trimethoprim-polymyxin b (POLYTRIM) ophthalmic solution Place 1 drop into the left eye every 4 (four) hours for 4 days. 04/14/18 04/18/18  Harrie Foreman, MD    Family History Family History  Problem Relation Age of Onset  . Stroke Mother   . Hypertension Mother   . Hyperlipidemia Mother   . Glaucoma Mother   . Heart attack Father 43  . Diabetes Father   . Hypertension Father   . Hyperlipidemia Father   . Cataracts Father   . Breast cancer Cousin     Social History Social History   Tobacco Use  . Smoking status: Former Smoker    Types: Cigarettes    Last attempt to quit: 03/20/1990    Years since quitting: 28.0  . Smokeless tobacco: Never Used  . Tobacco comment: quit 1988  Substance Use Topics  . Alcohol use: Yes    Alcohol/week: 1.0 - 2.0 standard drinks    Types: 1 - 2 Glasses of wine per week    Comment: Wine in the evenings  . Drug use: No      Allergies   Fluvastatin sodium   Review of Systems Review of Systems  Constitutional: Negative for chills and fever.  HENT: Negative for sore throat and tinnitus.   Eyes: Positive for discharge. Negative for redness.  Respiratory: Positive for cough. Negative for shortness of breath.   Cardiovascular: Negative for chest pain and palpitations.  Gastrointestinal: Negative for abdominal pain, diarrhea, nausea and vomiting.  Genitourinary: Negative for dysuria, frequency and urgency.  Musculoskeletal: Negative for myalgias.  Skin: Negative for rash.       No lesions  Neurological: Negative for weakness.  Hematological: Does not bruise/bleed easily.  Psychiatric/Behavioral: Negative for suicidal ideas.     Physical Exam Triage Vital Signs ED Triage Vitals  Enc Vitals Group     BP 04/14/18 1416 128/68     Pulse Rate 04/14/18 1416 (!) 55     Resp 04/14/18 1416 16     Temp 04/14/18 1416 98 F (36.7 C)     Temp Source 04/14/18 1416 Oral     SpO2 04/14/18 1416 100 %     Weight 04/14/18 1417 132 lb 4.4 oz (60 kg)     Height 04/14/18 1417 5\' 3"  (1.6 m)     Head Circumference --      Peak Flow --      Pain Score 04/14/18 1417 0     Pain Loc --      Pain Edu? --      Excl. in [place]? --    No data found.  Updated Vital Signs BP 128/68 (BP Location: Right Arm)   Pulse (!) 55   Temp 98 F (36.7 C) (Oral)   Resp 16   Ht 5\' 3"  (1.6 m)   Wt 60 kg   SpO2 100%   BMI 23.43 kg/m   Visual Acuity Right Eye Distance:   Left Eye Distance:   Bilateral Distance:    Right Eye Near:   Left Eye Near:    Bilateral Near:     Physical Exam Vitals signs and nursing note reviewed.  Constitutional:      General: She is not in acute distress.    Appearance: She is well-developed.  HENT:     Head: Normocephalic and atraumatic.  Eyes:     General: No scleral icterus.    Conjunctiva/sclera:     Left eye: Left conjunctiva  is injected. Exudate present.     Pupils: Pupils are  equal, round, and reactive to light.  Neck:     Musculoskeletal: Normal range of motion and neck supple.     Thyroid: No thyromegaly.     Vascular: No JVD.     Trachea: No tracheal deviation.  Cardiovascular:     Rate and Rhythm: Normal rate and regular rhythm.     Heart sounds: Normal heart sounds. No murmur. No friction rub. No gallop.   Pulmonary:     Effort: Pulmonary effort is normal.     Breath sounds: Normal breath sounds.  Abdominal:     General: Bowel sounds are normal. There is no distension.     Palpations: Abdomen is soft.     Tenderness: There is no abdominal tenderness.  Musculoskeletal: Normal range of motion.  Lymphadenopathy:     Cervical: No cervical adenopathy.  Skin:    General: Skin is warm and dry.  Neurological:     Mental Status: She is alert and oriented to person, place, and time.     Cranial Nerves: No cranial nerve deficit.  Psychiatric:        Behavior: Behavior normal.        Thought Content: Thought content normal.        Judgment: Judgment normal.      UC Treatments / Results  Labs (all labs ordered are listed, but only abnormal results are displayed) Labs Reviewed - No data to display  EKG None  Radiology No results found.  Procedures Procedures (including critical care time)  Medications Ordered in UC Medications - No data to display  Initial Impression / Assessment and Plan / UC Course  I have reviewed the triage vital signs and the nursing notes.  Pertinent labs & imaging results that were available during my care of the patient were reviewed by me and considered in my medical decision making (see chart for details).     Unclear viral versus bacterial conjunctivitis.  Treat with antibiotic drops.  Bronchitis may be secondary to atypical pneumonia.  Azithromycin and steroids.    Final Clinical Impressions(s) / UC Diagnoses   Final diagnoses:  Bronchitis  Acute viral conjunctivitis of left eye   Discharge Instructions    None    ED Prescriptions    Medication Sig Dispense Auth. Provider   predniSONE (STERAPRED UNI-PAK 21 TAB) 10 MG (21) TBPK tablet Take by mouth daily. Take 6 tabs by mouth daily  for 2 days, then 5 tabs for 2 days, then 4 tabs for 2 days, then 3 tabs for 2 days, 2 tabs for 2 days, then 1 tab by mouth daily for 2 days 42 tablet Harrie Foreman, MD   azithromycin (ZITHROMAX) 250 MG tablet Take 1 tablet (250 mg total) by mouth daily. Take first 2 tablets together, then 1 every day until finished. 6 tablet Harrie Foreman, MD   chlorpheniramine-HYDROcodone [facility] ER) 10-8 MG/5ML SUER Take 5 mLs by mouth at bedtime as needed for up to 14 days for cough. 70 mL Harrie Foreman, MD   trimethoprim-polymyxin b (POLYTRIM) ophthalmic solution Place 1 drop into the left eye every 4 (four) hours for 4 days. 10 mL Harrie Foreman, MD     Controlled Substance Prescriptions [place] Controlled Substance Registry consulted? Yes, I have consulted the [place] Controlled Substances Registry for this patient, and feel the risk/benefit ratio today is favorable for proceeding with this prescription for a controlled substance.  Harrie Foreman, MD 04/14/18 [phone]

## 2018-04-14 NOTE — ED Triage Notes (Signed)
Patient has had URI since 04/05/18; now is bothered by cough and conjunctivitis of left eye.

## 2018-04-17 ENCOUNTER — Ambulatory Visit: Payer: Medicare Other

## 2018-05-10 NOTE — Progress Notes (Signed)
Subjective:   Maria Romero is a 68 y.o. female who presents for an Initial Medicare Annual Wellness Visit.  Review of Systems    No ROS.  Medicare Wellness Visit. Additional risk factors are reflected in the social history.   Cardiac Risk Factors include: advanced age (>62men, >88 women);hypertension Sleep patterns: Getting 5-6 hours of sleep a night. Wakes up 1 time to go to the bathroom. Feels rested upon wakening Home Safety/Smoke Alarms: Feels safe in home. Smoke alarms in place.  Living environment; Lives alone in 2 story home handrails on the steps. Shower is a walk in shower no grab bars are in place. Seat Belt Safety/Bike Helmet: Wears seat belt.   Female:   Pap- aged out      [place]-  utd     Dexa scan-  utd      CCS- utd     Objective:    Today's Vitals   05/15/18 1030  BP: 133/67  Pulse: 84  SpO2: 100%  Weight: 144 lb (65.3 kg)  Height: 5\' 3"  (1.6 m)   Body mass index is 25.51 kg/m.  Advanced Directives 05/15/2018  Does Patient Have a Medical Advance Directive? Yes  Type of Paramedic of [place];Living will  Does patient want to make changes to medical advance directive? No - Patient declined  Copy of [place] in Chart? No - copy requested    Current Medications (verified) Outpatient Encounter Medications as of 05/15/2018  Medication Sig  . amLODipine (NORVASC) 5 MG tablet TAKE 0.5-1 TABLET BY MOUTH DAILY.  Marland Kitchen aspirin (ADULT ASPIRIN EC LOW STRENGTH) 81 MG EC tablet Take 81 mg by mouth daily.    Marland Kitchen atorvastatin (LIPITOR) 40 MG tablet TAKE 1 TABLET (40 MG TOTAL) DAILY BY MOUTH.  . Calcium Carb-Cholecalciferol (CALCIUM 1000 + D PO) Take 1 tablet by mouth daily.  . metoprolol succinate (TOPROL-XL) 25 MG 24 hr tablet TAKE 1 TABLET BY MOUTH EVERY DAY  . Omega-3 Fatty Acids (FISH OIL MAXIMUM STRENGTH) 1200 MG CAPS Take 1 capsule by mouth 2 (two) times daily.   . predniSONE (STERAPRED UNI-PAK 21 TAB) 10 MG (21) TBPK  tablet Take by mouth daily. Take 6 tabs by mouth daily  for 2 days, then 5 tabs for 2 days, then 4 tabs for 2 days, then 3 tabs for 2 days, 2 tabs for 2 days, then 1 tab by mouth daily for 2 days (Patient not taking: Reported on 05/15/2018)  . [DISCONTINUED] azithromycin (ZITHROMAX) 250 MG tablet Take 1 tablet (250 mg total) by mouth daily. Take first 2 tablets together, then 1 every day until finished.   No facility-administered encounter medications on file as of 05/15/2018.     Allergies (verified) Fluvastatin sodium   History: Past Medical History:  Diagnosis Date  . Hypercholesterolemia   . IBS (irritable bowel syndrome)   . Osteopenia   . Osteoporosis    Past Surgical History:  Procedure Laterality Date  . lump removal  1995  . TONSILLECTOMY    . TUBAL LIGATION  1990   Family History  Problem Relation Age of Onset  . Stroke Mother   . Hypertension Mother   . Hyperlipidemia Mother   . Glaucoma Mother   . Heart attack Father 19  . Diabetes Father   . Hypertension Father   . Hyperlipidemia Father   . Cataracts Father   . Breast cancer Cousin    Social History   Socioeconomic History  .  Marital status: Widowed    Spouse name: Not on file  . Number of children: 1  . Years of education: 72  . Highest education level: Associate degree: occupational, Hotel manager, or vocational program  Occupational History  . Occupation: retired Building surveyor Needs  . Financial resource strain: Not hard at all  . Food insecurity:    Worry: Never true    Inability: Never true  . Transportation needs:    Medical: No    Non-medical: No  Tobacco Use  . Smoking status: Former Smoker    Types: Cigarettes    Last attempt to quit: 03/20/1990    Years since quitting: 28.1  . Smokeless tobacco: Never Used  . Tobacco comment: quit 1988  Substance and Sexual Activity  . Alcohol use: Yes    Alcohol/week: 1.0 - 2.0 standard drinks    Types: 1 - 2 Glasses of wine per week    Comment: Wine  in the evenings  . Drug use: No  . Sexual activity: Never    Partners: Male  Lifestyle  . Physical activity:    Days per week: 2 days    Minutes per session: 40 min  . Stress: Not at all  Relationships  . Social connections:    Talks on phone: More than three times a week    Gets together: Once a week    Attends religious service: Never    Active member of club or organization: No    Attends meetings of clubs or organizations: Never    Relationship status: Widowed  Other Topics Concern  . Not on file  Social History Narrative   Regular exercise. Helps take care of her grandson. Gets out daily and socializes    Tobacco Counseling Counseling given: Not Answered Comment: quit 1988   Clinical Intake:  Pre-visit preparation completed: Yes  Pain : No/denies pain     Nutritional Risks: None Diabetes: No  How often do you need to have someone help you when you read instructions, pamphlets, or other written materials from your doctor or pharmacy?: 1 - Never What is the last grade level you completed in school?: 14  Interpreter Needed?: No  Information entered by :: Orlie Dakin, LPN   Activities of Daily Living In your present state of health, do you have any difficulty performing the following activities: 05/15/2018  Hearing? N  Comment oes have ringing in both ears  Vision? N  Difficulty concentrating or making decisions? N  Walking or climbing stairs? N  Dressing or bathing? N  Doing errands, shopping? N  Preparing Food and eating ? N  Using the Toilet? N  In the past six months, have you accidently leaked urine? Y  Comment only when coughs  Do you have problems with loss of bowel control? N  Managing your Medications? N  Managing your Finances? N  Housekeeping or managing your Housekeeping? N  Some recent data might be hidden     Immunizations and Health Maintenance Immunization History  Administered Date(s) Administered  . Influenza Whole 01/15/2009,  01/28/2010, 12/14/2010  . Influenza, High Dose Seasonal PF 11/27/2016, 01/25/2018  . Influenza,inj,Quad PF,6+ Mos 02/29/2012, 12/06/2012, 01/14/2014, 02/09/2015, 03/18/2016  . Pneumococcal Conjugate-13 04/23/2014  . Pneumococcal Polysaccharide-23 09/16/2015  . Td 08/10/2006  . Tdap 04/23/2014  . Zoster 12/24/2009  . Zoster Recombinat (Shingrix) 09/09/2016, 03/17/2017   There are no preventive care reminders to display for this patient.  Patient Care Team: Hali Marry, MD as PCP -  General  Indicate any recent Medical Services you may have received from other than [facility] providers in the past year (date may be approximate).     Assessment:   This is a routine wellness examination for Danay.Physical assessment deferred to PCP.   Hearing/Vision screen  Visual Acuity Screening   Right eye Left eye Both eyes  Without correction:     With correction: 20/20 20/20 20/20   Hearing Screening Comments: Whisper test done and patient reported back all 3 words correctly.  Dietary issues and exercise activities discussed: Current Exercise Habits: Home exercise routine, Type of exercise: walking, Time (Minutes): 40, Frequency (Times/Week): 2, Weekly Exercise (Minutes/Week): 80, Intensity: Mild, Exercise limited by: None identified Diet eats healthy- avacodos, chicken and salmon, vegetables Breakfast: muffin with peanut butter and rasberris and coffee Lunch: sandwich, pickles, avacodo Dinner: meat, vegetables or salad with nuts as snack later on.      Goals    . Weight (lb) < 200 lb (90.7 kg)     Would like to loose 10-15 lbs. Increase water intake      Depression Screen PHQ 2/9 Scores 05/15/2018 09/15/2017 03/17/2017 09/16/2015 04/23/2014  PHQ - 2 Score 0 0 0 0 0    Fall Risk Fall Risk  05/15/2018 02/27/2018 03/17/2017 03/16/2017 09/16/2015  Falls in the past year? 0 0 No No No  Comment - Emmi Telephone Survey: data to providers prior to load - Emmi Telephone Survey: data to providers prior  to load -  Follow up Falls prevention discussed - - - -    Is the patient's home free of loose throw rugs in walkways, pet beds, electrical cords, etc?   yes      Grab bars in the bathroom? no      Handrails on the stairs?   yes      Adequate lighting?   yes  Cognitive Function:     6CIT Screen 05/15/2018  What Year? 0 points  What month? 0 points  What time? 0 points  Count back from 20 0 points  Months in reverse 0 points  Repeat phrase 0 points  Total Score 0    Screening Tests Health Maintenance  Topic Date Due  . MAMMOGRAM  03/14/2020  . COLONOSCOPY  01/23/2022  . TETANUS/TDAP  04/23/2024  . INFLUENZA VACCINE  Completed  . DEXA SCAN  Completed  . Hepatitis C Screening  Completed  . PNA vac Low Risk Adult  Completed     Plan:    You are doing great, keep up your healthy lifestyle. Discussed side effects and how to take Boniva with patient and she has decided to try the [facility] for the osteoporosis. Also still has a cough from when seen at urgent care in January and wants to know if you can give her something for that. You will hear from [place] today in regards to your meds. Ms. Harr , Thank you for taking time to come for your Medicare Wellness Visit. I appreciate your ongoing commitment to your health goals. Please review the following plan we discussed and let me know if I can assist you in the future.  Please schedule your next medicare wellness visit with me in 1 yr. Continue doing brain stimulating activities (puzzles, reading, adult coloring books, staying active) to keep memory sharp.    These are the goals we discussed: Goals    . Weight (lb) < 200 lb (90.7 kg)     Would like to loose 10-15 lbs. Increase water  intake       This is a list of the screening recommended for you and due dates:  Health Maintenance  Topic Date Due  . Mammogram  03/14/2020  . Colon Cancer Screening  01/23/2022  . Tetanus Vaccine  04/23/2024  . Flu Shot  Completed  . DEXA scan  (bone density measurement)  Completed  .  Hepatitis C: One time screening is recommended by Center for Disease Control  (CDC) for  adults born from [birth year] through 1967.   Completed  . Pneumonia vaccines  Completed     I have personally reviewed and noted the following in the patient's chart:   . Medical and social history . Use of alcohol, tobacco or illicit drugs  . Current medications and supplements . Functional ability and status . Nutritional status . Physical activity . Advanced directives . List of other physicians . Hospitalizations, surgeries, and ER visits in previous 12 months . Vitals . Screenings to include cognitive, depression, and falls . Referrals and appointments  In addition, I have reviewed and discussed with patient certain preventive protocols, quality metrics, and best practice recommendations. A written personalized care plan for preventive services as well as general preventive health recommendations were provided to patient.     Joanne Chars, LPN   09/12/8175

## 2018-05-15 ENCOUNTER — Ambulatory Visit (INDEPENDENT_AMBULATORY_CARE_PROVIDER_SITE_OTHER): Payer: Medicare Other | Admitting: *Deleted

## 2018-05-15 VITALS — BP 133/67 | HR 84 | Ht 63.0 in | Wt 144.0 lb

## 2018-05-15 DIAGNOSIS — Z0000 Encounter for general adult medical examination without abnormal findings: Secondary | ICD-10-CM | POA: Diagnosis not present

## 2018-05-15 MED ORDER — BENZONATATE 200 MG PO CAPS: 200.0000 mg | ORAL_CAPSULE | Freq: Two times a day (BID) | ORAL | 0 refills | Status: DC | PRN

## 2018-05-15 MED ORDER — IBANDRONATE SODIUM 150 MG PO TABS: 150.0000 mg | ORAL_TABLET | ORAL | 3 refills | Status: DC

## 2018-05-15 NOTE — Patient Instructions (Signed)
You are doing great, keep up your healthy lifestyle.  Maria Romero , Thank you for taking time to come for your Medicare Wellness Visit. I appreciate your ongoing commitment to your health goals. Please review the following plan we discussed and let me know if I can assist you in the future.  Please schedule your next medicare wellness visit with me in 1 yr.  You are doing great, keep up your healthy lifestyle.  Continue doing brain stimulating activities (puzzles, reading, adult coloring books, staying active) to keep memory sharp.  These are the goals we discussed: Goals    . Weight (lb) < 200 lb (90.7 kg)     Would like to loose 10-15 lbs. Increase water intake

## 2018-05-15 NOTE — Progress Notes (Signed)
Rx sent for Boniva and TEssalon perles.

## 2018-05-15 NOTE — Addendum Note (Signed)
Addended by: Beatrice Lecher D on: 05/15/2018 08:46 PM   Modules accepted: Orders

## 2018-09-17 ENCOUNTER — Ambulatory Visit (INDEPENDENT_AMBULATORY_CARE_PROVIDER_SITE_OTHER): Payer: Medicare Other | Admitting: Family Medicine

## 2018-09-17 ENCOUNTER — Encounter: Payer: Self-pay | Admitting: Family Medicine

## 2018-09-17 VITALS — BP 132/71 | HR 58 | Temp 98.7°F | Ht 63.07 in | Wt 143.0 lb

## 2018-09-17 DIAGNOSIS — I10 Essential (primary) hypertension: Secondary | ICD-10-CM | POA: Diagnosis not present

## 2018-09-17 DIAGNOSIS — M79601 Pain in right arm: Secondary | ICD-10-CM

## 2018-09-17 DIAGNOSIS — E7800 Pure hypercholesterolemia, unspecified: Secondary | ICD-10-CM

## 2018-09-17 MED ORDER — METOPROLOL SUCCINATE ER 25 MG PO TB24: 25.0000 mg | ORAL_TABLET | Freq: Every day | ORAL | 1 refills | Status: DC

## 2018-09-17 MED ORDER — ATORVASTATIN CALCIUM 40 MG PO TABS: 40.0000 mg | ORAL_TABLET | Freq: Every day | ORAL | 3 refills | Status: DC

## 2018-09-17 MED ORDER — AMLODIPINE BESYLATE 5 MG PO TABS: ORAL_TABLET | ORAL | 1 refills | Status: DC

## 2018-09-17 NOTE — Progress Notes (Signed)
Virtual Visit via Video Note  I connected with Maria Romero on 09/17/18 at  9:50 AM EDT by a video enabled telemedicine application and verified that I am speaking with the correct person using two identifiers.   I discussed the limitations of evaluation and management by telemedicine and the availability of in person appointments. The patient expressed understanding and agreed to proceed.  Pt was at home and I was in my office for the virtual visit.     Subjective:    CC: F/U BP   HPI: Hypertension- Pt denies chest pain, SOB, dizziness, or heart palpitations.  Taking meds as directed w/o problems.  Denies medication side effects.     Hyperlipidemia-currently on statin tolerating well without any significant side effects or myalgias.   No pain in the right upper arm, below the shoulder on the outer arm. Not painful to sleep on. And her tendonitis in her right wrist is back. Denied any new injury ro trauma. Has been walking her dog a lot.    She wonders if could be coming from her Boniva.    Past medical history, Surgical history, Family history not pertinant except as noted below, Social history, Allergies, and medications have been entered into the medical record, reviewed, and corrections made.   Review of Systems: No fevers, chills, night sweats, weight loss, chest pain, or shortness of breath.   Objective:    General: Speaking clearly in complete sentences without any shortness of breath.  Alert and oriented x3.  Normal judgment. No apparent acute distress.  Well groomed.  Right shoulder with NROM.  Well groomed.     Impression and Recommendations:    HTN- due for CMP and lipids.  Home blood pressure looks fantastic we will continue current regimen.  Follow-up in 6 months.   Hyperlipidemia-due to recheck lipid panel.  Right upper arm pain - may be coming from her shoulder. Unlikely to be from her Boniva. For her tendonitis, she has a wrist splint at home and has been  using it some. Work on stretches. Come in for evaluation if not improving.      I discussed the assessment and treatment plan with the patient. The patient was provided an opportunity to ask questions and all were answered. The patient agreed with the plan and demonstrated an understanding of the instructions.   The patient was advised to call back or seek an in-person evaluation if the symptoms worsen or if the condition fails to improve as anticipated.   Maria Lecher, MD

## 2018-09-24 DIAGNOSIS — H524 Presbyopia: Secondary | ICD-10-CM | POA: Diagnosis not present

## 2018-09-24 DIAGNOSIS — Z135 Encounter for screening for eye and ear disorders: Secondary | ICD-10-CM | POA: Diagnosis not present

## 2018-09-24 DIAGNOSIS — H2513 Age-related nuclear cataract, bilateral: Secondary | ICD-10-CM | POA: Diagnosis not present

## 2018-09-24 DIAGNOSIS — D3132 Benign neoplasm of left choroid: Secondary | ICD-10-CM | POA: Diagnosis not present

## 2018-10-17 DIAGNOSIS — I10 Essential (primary) hypertension: Secondary | ICD-10-CM | POA: Diagnosis not present

## 2018-10-18 LAB — COMPLETE METABOLIC PANEL WITH GFR
AG Ratio: 1.9 (calc) (ref 1.0–2.5)
ALT: 15 U/L (ref 6–29)
AST: 18 U/L (ref 10–35)
Albumin: 4.4 g/dL (ref 3.6–5.1)
Alkaline phosphatase (APISO): 37 U/L (ref 37–153)
BUN: 18 mg/dL (ref 7–25)
CO2: 27 mmol/L (ref 20–32)
Calcium: 9.6 mg/dL (ref 8.6–10.4)
Chloride: 106 mmol/L (ref 98–110)
Creat: 0.86 mg/dL (ref 0.50–0.99)
GFR, Est African American: 80 mL/min/{1.73_m2} (ref >=60)
GFR, Est Non African American: 69 mL/min/{1.73_m2} (ref >=60)
Globulin: 2.3 g/dL (calc) (ref 1.9–3.7)
Glucose, Bld: 106 mg/dL — ABNORMAL HIGH (ref 65–99)
Potassium: 4.7 mmol/L (ref 3.5–5.3)
Sodium: 140 mmol/L (ref 135–146)
Total Bilirubin: 0.7 mg/dL (ref 0.2–1.2)
Total Protein: 6.7 g/dL (ref 6.1–8.1)

## 2018-10-18 LAB — LIPID PANEL
Cholesterol: 162 mg/dL (ref <200)
HDL: 81 mg/dL (ref >=50)
LDL Cholesterol (Calc): 65 mg/dL (calc)
Non-HDL Cholesterol (Calc): 81 mg/dL (calc) (ref <130)
Total CHOL/HDL Ratio: 2 (calc) (ref <5.0)
Triglycerides: 77 mg/dL (ref <150)

## 2018-12-15 DIAGNOSIS — Z23 Encounter for immunization: Secondary | ICD-10-CM | POA: Diagnosis not present

## 2019-03-21 ENCOUNTER — Other Ambulatory Visit: Payer: Self-pay | Admitting: Family Medicine

## 2019-03-21 DIAGNOSIS — I10 Essential (primary) hypertension: Secondary | ICD-10-CM

## 2019-03-21 DIAGNOSIS — Z1231 Encounter for screening mammogram for malignant neoplasm of breast: Secondary | ICD-10-CM

## 2019-04-04 ENCOUNTER — Ambulatory Visit (INDEPENDENT_AMBULATORY_CARE_PROVIDER_SITE_OTHER): Payer: Medicare Other

## 2019-04-04 ENCOUNTER — Other Ambulatory Visit: Payer: Self-pay

## 2019-04-04 DIAGNOSIS — Z1231 Encounter for screening mammogram for malignant neoplasm of breast: Secondary | ICD-10-CM | POA: Diagnosis not present

## 2019-05-01 ENCOUNTER — Telehealth: Payer: Self-pay

## 2019-05-01 NOTE — Telephone Encounter (Signed)
Maria Romero called asking where to go to get the COVID-19 vaccine. I did direct her to the [facility] web site. Advised her to join out waiting list.   [place] On Tuesday, Jan. 19, [place] and the [facility]) begin large-scale COVID-19 vaccinations at the [place]. The vaccinations are appointment only and for those 38 and older. Walk-ins will NOT be accepted. All appointments are currently filled. Please join our waiting list for the next available appointments. We will contact you when appointments become available. Please do not sign up more than once. Join Our Waiting List

## 2019-05-13 NOTE — Progress Notes (Deleted)
Subjective:   Maria Romero is a 69 y.o. female who presents for Medicare Annual (Subsequent) preventive examination.  Review of Systems:  No ROS.  Medicare Wellness Virtual Visit.  Visual/audio telehealth visit, UTA vital signs.   See social history for additional risk factors.      Sleep patterns:    Home Safety/Smoke Alarms: Feels safe in home. Smoke alarms in place.  Living environment; Seat Belt Safety/Bike Helmet: Wears seat belt.   Female:   Pap- Aged out      Mammo-  UTD     Dexa scan- UTD       CCS- UTD     Objective:     Vitals: There were no vitals taken for this visit.  There is no height or weight on file to calculate BMI.  Advanced Directives 05/15/2018  Does Patient Have a Medical Advance Directive? Yes  Type of Paramedic of Maria Romero;Living will  Does patient want to make changes to medical advance directive? No - Patient declined  Copy of Maria Romero in Chart? No - copy requested    Tobacco Social History   Tobacco Use  Smoking Status Former Smoker  . Types: Cigarettes  . Quit date: 03/20/1990  . Years since quitting: 29.1  Smokeless Tobacco Never Used  Tobacco Comment   quit 1988     Counseling given: Not Answered Comment: quit 1988   Clinical Intake:                       Past Medical History:  Diagnosis Date  . Hypercholesterolemia   . IBS (irritable bowel syndrome)   . Osteopenia   . Osteoporosis    Past Surgical History:  Procedure Laterality Date  . lump removal  1995  . TONSILLECTOMY    . TUBAL LIGATION  1990   Family History  Problem Relation Age of Onset  . Stroke Mother   . Hypertension Mother   . Hyperlipidemia Mother   . Glaucoma Mother   . Heart attack Father 89  . Diabetes Father   . Hypertension Father   . Hyperlipidemia Father   . Cataracts Father   . Breast cancer Cousin    Social History   Socioeconomic History  . Marital status: Widowed   Spouse name: Not on file  . Number of children: 1  . Years of education: 57  . Highest education level: Associate degree: occupational, Hotel manager, or vocational program  Occupational History  . Occupation: retired Quarry manager  Tobacco Use  . Smoking status: Former Smoker    Types: Cigarettes    Quit date: 03/20/1990    Years since quitting: 29.1  . Smokeless tobacco: Never Used  . Tobacco comment: quit 1988  Substance and Sexual Activity  . Alcohol use: Yes    Alcohol/week: 1.0 - 2.0 standard drinks    Types: 1 - 2 Glasses of wine per week    Comment: Wine in the evenings  . Drug use: No  . Sexual activity: Never    Partners: Male  Other Topics Concern  . Not on file  Social History Narrative   Regular exercise. Helps take care of her grandson. Gets out daily and socializes   Social Determinants of Health   Financial Resource Strain: Low Risk   . Difficulty of Paying Living Expenses: Not hard at all  Food Insecurity: No Food Insecurity  . Worried About Charity fundraiser in the Last Year:  Never true  . Ran Out of Food in the Last Year: Never true  Transportation Needs: No Transportation Needs  . Lack of Transportation (Medical): No  . Lack of Transportation (Non-Medical): No  Physical Activity: Insufficiently Active  . Days of Exercise per Week: 2 days  . Minutes of Exercise per Session: 40 min  Stress: No Stress Concern Present  . Feeling of Stress : Not at all  Social Connections: Moderately Isolated  . Frequency of Communication with Friends and Family: More than three times a week  . Frequency of Social Gatherings with Friends and Family: Once a week  . Attends Religious Services: Never  . Active Member of Clubs or Organizations: No  . Attends Archivist Meetings: Never  . Marital Status: Widowed    Outpatient Encounter Medications as of 05/20/2019  Medication Sig  . amLODipine (NORVASC) 5 MG tablet TAKE 0.5-1 TABLET BY MOUTH DAILY  . aspirin (ADULT  ASPIRIN EC LOW STRENGTH) 81 MG EC tablet Take 81 mg by mouth daily.    Marland Kitchen atorvastatin (LIPITOR) 40 MG tablet Take 1 tablet (40 mg total) by mouth daily.  . Calcium Carb-Cholecalciferol (CALCIUM 1000 + D PO) Take 1 tablet by mouth daily.  Marland Kitchen ibandronate (BONIVA) 150 MG tablet TAKE 1 TABLET (150 MG TOTAL) BY MOUTH EVERY 30 (THIRTY) DAYS. TAKE IN THE MORNING WITH A FULL GLASS OF WATER, ON AN EMPTY STOMACH, AND DO NOT TAKE ANYTHING ELSE BY MOUTH OR LIE DOWN FOR THE NEXT 30 MIN.  . metoprolol succinate (TOPROL-XL) 25 MG 24 hr tablet TAKE 1 TABLET BY MOUTH EVERY DAY  . Omega-3 Fatty Acids (FISH OIL MAXIMUM STRENGTH) 1200 MG CAPS Take 1 capsule by mouth 2 (two) times daily.    No facility-administered encounter medications on file as of 05/20/2019.    Activities of Daily Living In your present state of health, do you have any difficulty performing the following activities: 05/15/2018  Hearing? N  Comment oes have ringing in both ears  Vision? N  Difficulty concentrating or making decisions? N  Walking or climbing stairs? N  Dressing or bathing? N  Doing errands, shopping? N  Preparing Food and eating ? N  Using the Toilet? N  In the past six months, have you accidently leaked urine? Y  Comment only when coughs  Do you have problems with loss of bowel control? N  Managing your Medications? N  Managing your Finances? N  Housekeeping or managing your Housekeeping? N  Some recent data might be hidden    Patient Care Team: Hali Marry, MD as PCP - General    Assessment:   This is a routine wellness examination for Maria Romero.Physical assessment deferred to PCP.   Exercise Activities and Dietary recommendations   Diet  Breakfast: Lunch:  Dinner:       Goals    . Weight (lb) < 200 lb (90.7 kg)     Would like to loose 10-15 lbs. Increase water intake       Fall Risk Fall Risk  05/15/2018 02/27/2018 03/17/2017 03/16/2017 09/16/2015  Falls in the past year? 0 0 No No No  Comment -  Emmi Telephone Survey: data to providers prior to load - Emmi Telephone Survey: data to providers prior to load -  Follow up Falls prevention discussed - - - -   Is the patient's home free of loose throw rugs in walkways, pet beds, electrical cords, etc?   {Blank single:19197::"yes","no"}      Grab  bars in the bathroom? {Blank single:19197::"yes","no"}      Handrails on the stairs?   {Blank single:19197::"yes","no"}      Adequate lighting?   {Blank single:19197::"yes","no"}   Depression Screen PHQ 2/9 Scores 09/17/2018 05/15/2018 09/15/2017 03/17/2017  PHQ - 2 Score 0 0 0 0     Cognitive Function     6CIT Screen 05/15/2018  What Year? 0 points  What month? 0 points  What time? 0 points  Count back from 20 0 points  Months in reverse 0 points  Repeat phrase 0 points  Total Score 0    Immunization History  Administered Date(s) Administered  . Influenza Whole 01/15/2009, 01/28/2010, 12/14/2010  . Influenza, High Dose Seasonal PF 11/27/2016, 01/25/2018  . Influenza,inj,Quad PF,6+ Mos 02/29/2012, 12/06/2012, 01/14/2014, 02/09/2015, 03/18/2016  . Pneumococcal Conjugate-13 04/23/2014  . Pneumococcal Polysaccharide-23 09/16/2015  . Td 08/10/2006  . Tdap 04/23/2014  . Zoster 12/24/2009  . Zoster Recombinat (Shingrix) 09/09/2016, 03/17/2017    Screening Tests Health Maintenance  Topic Date Due  . INFLUENZA VACCINE  11/10/2018  . MAMMOGRAM  04/03/2021  . COLONOSCOPY  01/23/2022  . TETANUS/TDAP  04/23/2024  . DEXA SCAN  Completed  . Hepatitis C Screening  Completed  . PNA vac Low Risk Adult  Completed        Plan:   ***   I have personally reviewed and noted the following in the patient's chart:   . Medical and social history . Use of alcohol, tobacco or illicit drugs  . Current medications and supplements . Functional ability and status . Nutritional status . Physical activity . Advanced directives . List of other physicians . Hospitalizations, surgeries, and ER visits  in previous 12 months . Vitals . Screenings to include cognitive, depression, and falls . Referrals and appointments  In addition, I have reviewed and discussed with patient certain preventive protocols, quality metrics, and best practice recommendations. A written personalized care plan for preventive services as well as general preventive health recommendations were provided to patient.     Joanne Chars, LPN  [phone]

## 2019-05-20 ENCOUNTER — Ambulatory Visit: Payer: Medicare Other

## 2019-05-20 NOTE — Progress Notes (Signed)
Subjective:   Maria Romero is a 69 y.o. female who presents for Medicare Annual (Subsequent) preventive examination.  Review of Systems:  No ROS.  Medicare Wellness Virtual Visit.  Visual/audio telehealth visit, UTA vital signs.   See social history for additional risk factors.    Cardiac Risk Factors include: advanced age (>13men, >66 women);dyslipidemia;hypertension Sleep patterns: Getting 5-6 hours of sleep a night. Wakes up 2 times a night to void.  Wakes up and feels rested and ready for the day.   Home Safety/Smoke Alarms: Feels safe in home. Smoke alarms in place.  Living environment; Lives alone in a 2 story home and stairs have hand rails on them. Shower is a walk in shower and no grab bars in place. Seat Belt Safety/Bike Helmet: Wears seat belt.   Female:   Pap- Aged out      Mammo- UTD       Dexa scan- UTD       CCS- UTD    Objective:     Vitals: BP 119/63   Pulse (!) 57   Temp 98.1 F (36.7 C) (Oral)   Ht 5\' 3"  (1.6 m)   Wt 145 lb (65.8 kg)   BMI 25.69 kg/m   Body mass index is 25.69 kg/m.  Advanced Directives 05/29/2019 05/15/2018  Does Patient Have a Medical Advance Directive? Yes Yes  Type of Paramedic of [place];Living will [place];Living will  Does patient want to make changes to medical advance directive? No - Patient declined No - Patient declined  Copy of [place] in Chart? No - copy requested No - copy requested    Tobacco Social History   Tobacco Use  Smoking Status Former Smoker  . Types: Cigarettes  . Quit date: 03/20/1990  . Years since quitting: 29.2  Smokeless Tobacco Never Used  Tobacco Comment   quit 1988     Counseling given: No Comment: quit 1988   Clinical Intake:  Pre-visit preparation completed: Yes  Pain : No/denies pain     Nutritional Risks: None Diabetes: No  How often do you need to have someone help you when you read instructions,  pamphlets, or other written materials from your doctor or pharmacy?: 1 - Never What is the last grade level you completed in school?: 14  Interpreter Needed?: No  Information entered by :: Orlie Dakin, LPN  Past Medical History:  Diagnosis Date  . Hypercholesterolemia   . IBS (irritable bowel syndrome)   . Osteopenia   . Osteoporosis    Past Surgical History:  Procedure Laterality Date  . lump removal  1995  . TONSILLECTOMY    . TUBAL LIGATION  1990   Family History  Problem Relation Age of Onset  . Stroke Mother   . Hypertension Mother   . Hyperlipidemia Mother   . Glaucoma Mother   . Heart attack Father 67  . Diabetes Father   . Hypertension Father   . Hyperlipidemia Father   . Cataracts Father   . Breast cancer Cousin    Social History   Socioeconomic History  . Marital status: Widowed    Spouse name: Not on file  . Number of children: 1  . Years of education: 57  . Highest education level: Associate degree: occupational, Hotel manager, or vocational program  Occupational History  . Occupation: retired Quarry manager  Tobacco Use  . Smoking status: Former Smoker    Types: Cigarettes    Quit date:  03/20/1990    Years since quitting: 29.2  . Smokeless tobacco: Never Used  . Tobacco comment: quit 1988  Substance and Sexual Activity  . Alcohol use: Yes    Alcohol/week: 1.0 - 2.0 standard drinks    Types: 1 - 2 Glasses of wine per week    Comment: Wine in the evenings  . Drug use: No  . Sexual activity: Never    Partners: Male  Other Topics Concern  . Not on file  Social History Narrative   Regular exercise. Helps take care of her grandson. Gets out daily and socializes   Social Determinants of Health   Financial Resource Strain:   . Difficulty of Paying Living Expenses: Not on file  Food Insecurity:   . Worried About Charity fundraiser in the Last Year: Not on file  . Ran Out of Food in the Last Year: Not on file  Transportation Needs:   . Lack of  Transportation (Medical): Not on file  . Lack of Transportation (Non-Medical): Not on file  Physical Activity:   . Days of Exercise per Week: Not on file  . Minutes of Exercise per Session: Not on file  Stress:   . Feeling of Stress : Not on file  Social Connections:   . Frequency of Communication with Friends and Family: Not on file  . Frequency of Social Gatherings with Friends and Family: Not on file  . Attends Religious Services: Not on file  . Active Member of Clubs or Organizations: Not on file  . Attends Archivist Meetings: Not on file  . Marital Status: Not on file    Outpatient Encounter Medications as of 05/29/2019  Medication Sig  . amLODipine (NORVASC) 5 MG tablet TAKE 0.5-1 TABLET BY MOUTH DAILY  . aspirin (ADULT ASPIRIN EC LOW STRENGTH) 81 MG EC tablet Take 81 mg by mouth daily.    Marland Kitchen atorvastatin (LIPITOR) 40 MG tablet Take 1 tablet (40 mg total) by mouth daily.  . Calcium Carb-Cholecalciferol (CALCIUM 1000 + D PO) Take 1 tablet by mouth daily.  Marland Kitchen ibandronate (BONIVA) 150 MG tablet TAKE 1 TABLET (150 MG TOTAL) BY MOUTH EVERY 30 (THIRTY) DAYS. TAKE IN THE MORNING WITH A FULL GLASS OF WATER, ON AN EMPTY STOMACH, AND DO NOT TAKE ANYTHING ELSE BY MOUTH OR LIE DOWN FOR THE NEXT 30 MIN.  . metoprolol succinate (TOPROL-XL) 25 MG 24 hr tablet TAKE 1 TABLET BY MOUTH EVERY DAY  . Omega-3 Fatty Acids (FISH OIL MAXIMUM STRENGTH) 1200 MG CAPS Take 1 capsule by mouth 2 (two) times daily.    No facility-administered encounter medications on file as of 05/29/2019.    Activities of Daily Living In your present state of health, do you have any difficulty performing the following activities: 05/29/2019  Hearing? N  Comment has ringing in ears  Vision? N  Difficulty concentrating or making decisions? N  Walking or climbing stairs? N  Dressing or bathing? N  Doing errands, shopping? N  Preparing Food and eating ? N  Using the Toilet? N  In the past six months, have you  accidently leaked urine? Y  Comment if doesn't get there in time  Do you have problems with loss of bowel control? N  Managing your Medications? N  Managing your Finances? N  Housekeeping or managing your Housekeeping? N  Some recent data might be hidden    Patient Care Team: Hali Marry, MD as PCP - General    Assessment:  This is a routine wellness examination for [place].Physical assessment deferred to PCP.   Exercise Activities and Dietary recommendations Current Exercise Habits: The patient does not participate in regular exercise at present, Exercise limited by: None identified Diet Eating a healthy diet of vegetables, fruits and proteins. Eats a lot of vegetables Breakfast: Oatmeal with fresh fruit or english miuffin with rasberry or blueberries and coffee Lunch: sandwich 1/2 with tomato and pickles Dinner: Meat- chicken and vegetables and fish as well Doesn't drink much water during the day. Drinks unsweetened ice tea      Goals    . Exercise 3x per week (30 min per time)     Increase her exercise of walking more outside    . Weight (lb) < 200 lb (90.7 kg)     Would like to loose 10-15 lbs. Increase water intake       Fall Risk Fall Risk  05/29/2019 05/15/2018 02/27/2018 03/17/2017 03/16/2017  Falls in the past year? 0 0 0 No No  Comment - - Emmi Telephone Survey: data to providers prior to load - Emmi Telephone Survey: data to providers prior to load  Risk for fall due to : No Fall Risks - - - -  Follow up Falls prevention discussed Falls prevention discussed - - -   Is the patient's home free of loose throw rugs in walkways, pet beds, electrical cords, etc?   yes      Grab bars in the bathroom? no      Handrails on the stairs?   yes      Adequate lighting?   yes   Depression Screen PHQ 2/9 Scores 05/29/2019 09/17/2018 05/15/2018 09/15/2017  PHQ - 2 Score 0 0 0 0     Cognitive Function     6CIT Screen 05/29/2019 05/15/2018  What Year? 0 points 0 points   What month? 0 points 0 points  What time? 0 points 0 points  Count back from 20 0 points 0 points  Months in reverse 0 points 0 points  Repeat phrase 0 points 0 points  Total Score 0 0    Immunization History  Administered Date(s) Administered  . Influenza Whole 01/15/2009, 01/28/2010, 12/14/2010  . Influenza, High Dose Seasonal PF 11/27/2016, 01/25/2018  . Influenza,inj,Quad PF,6+ Mos 02/29/2012, 12/06/2012, 01/14/2014, 02/09/2015, 03/18/2016  . Influenza-Unspecified 01/09/2019  . Moderna SARS-COVID-2 Vaccination 05/10/2019  . Pneumococcal Conjugate-13 04/23/2014  . Pneumococcal Polysaccharide-23 09/16/2015  . Td 08/10/2006  . Tdap 04/23/2014  . Zoster 12/24/2009  . Zoster Recombinat (Shingrix) 09/09/2016, 03/17/2017    Screening Tests Health Maintenance  Topic Date Due  . MAMMOGRAM  04/03/2021  . COLONOSCOPY  01/23/2022  . TETANUS/TDAP  04/23/2024  . INFLUENZA VACCINE  Completed  . DEXA SCAN  Completed  . Hepatitis C Screening  Completed  . PNA vac Low Risk Adult  Completed      Plan:    Please schedule your next medicare wellness visit with me in 1 yr.  Ms. Verret , Thank you for taking time to come for your Medicare Wellness Visit. I appreciate your ongoing commitment to your health goals. Please review the following plan we discussed and let me know if I can assist you in the future.  Continue doing brain stimulating activities (puzzles, reading, adult coloring books, staying active) to keep memory sharp.   Try and increase your water intake to at least 32 ounces daily.  These are the goals we discussed: Goals    . Exercise 3x per  week (30 min per time)     Increase her exercise of walking more outside    . Weight (lb) < 200 lb (90.7 kg)     Would like to loose 10-15 lbs. Increase water intake       This is a list of the screening recommended for you and due dates:  Health Maintenance  Topic Date Due  . Mammogram  04/03/2021  . Colon Cancer Screening   01/23/2022  . Tetanus Vaccine  04/23/2024  . Flu Shot  Completed  . DEXA scan (bone density measurement)  Completed  .  Hepatitis C: One time screening is recommended by Center for Disease Control  (CDC) for  adults born from [birth year] through 1967.   Completed  . Pneumonia vaccines  Completed      I have personally reviewed and noted the following in the patient's chart:   . Medical and social history . Use of alcohol, tobacco or illicit drugs  . Current medications and supplements . Functional ability and status . Nutritional status . Physical activity . Advanced directives . List of other physicians . Hospitalizations, surgeries, and ER visits in previous 12 months . Vitals . Screenings to include cognitive, depression, and falls . Referrals and appointments  In addition, I have reviewed and discussed with patient certain preventive protocols, quality metrics, and best practice recommendations. A written personalized care plan for preventive services as well as general preventive health recommendations were provided to patient.     Joanne Chars, LPN  [id number]

## 2019-05-29 ENCOUNTER — Ambulatory Visit (INDEPENDENT_AMBULATORY_CARE_PROVIDER_SITE_OTHER): Payer: Medicare Other | Admitting: *Deleted

## 2019-05-29 VITALS — BP 119/63 | HR 57 | Temp 98.1°F | Ht 63.0 in | Wt 145.0 lb

## 2019-05-29 DIAGNOSIS — Z0000 Encounter for general adult medical examination without abnormal findings: Secondary | ICD-10-CM | POA: Diagnosis not present

## 2019-05-29 NOTE — Patient Instructions (Signed)
Please schedule your next medicare wellness visit with me in 1 yr.  Ms. Silvius , Thank you for taking time to come for your Medicare Wellness Visit. I appreciate your ongoing commitment to your health goals. Please review the following plan we discussed and let me know if I can assist you in the future.  Continue doing brain stimulating activities (puzzles, reading, adult coloring books, staying active) to keep memory sharp.   Try and increase your water intake to at least 32 ounces daily.  These are the goals we discussed: Goals    . Exercise 3x per week (30 min per time)     Increase her exercise of walking more outside    . Weight (lb) < 200 lb (90.7 kg)     Would like to loose 10-15 lbs. Increase water intake

## 2019-09-11 ENCOUNTER — Other Ambulatory Visit: Payer: Self-pay | Admitting: Family Medicine

## 2019-09-11 ENCOUNTER — Other Ambulatory Visit: Payer: Self-pay

## 2019-09-11 DIAGNOSIS — I10 Essential (primary) hypertension: Secondary | ICD-10-CM

## 2019-09-11 MED ORDER — AMLODIPINE BESYLATE 5 MG PO TABS: ORAL_TABLET | ORAL | 0 refills | Status: DC

## 2019-09-13 ENCOUNTER — Other Ambulatory Visit: Payer: Self-pay | Admitting: Family Medicine

## 2019-09-13 DIAGNOSIS — I10 Essential (primary) hypertension: Secondary | ICD-10-CM

## 2019-09-30 ENCOUNTER — Other Ambulatory Visit: Payer: Self-pay | Admitting: Family Medicine

## 2019-09-30 DIAGNOSIS — I10 Essential (primary) hypertension: Secondary | ICD-10-CM

## 2019-10-04 ENCOUNTER — Other Ambulatory Visit: Payer: Self-pay

## 2019-10-04 DIAGNOSIS — I10 Essential (primary) hypertension: Secondary | ICD-10-CM

## 2019-10-04 MED ORDER — AMLODIPINE BESYLATE 5 MG PO TABS: ORAL_TABLET | ORAL | 0 refills | Status: DC

## 2019-10-04 NOTE — Telephone Encounter (Signed)
Patient scheduled for a follow up appointment. Refill sent.

## 2019-10-24 ENCOUNTER — Encounter: Payer: Self-pay | Admitting: Family Medicine

## 2019-10-24 ENCOUNTER — Ambulatory Visit (INDEPENDENT_AMBULATORY_CARE_PROVIDER_SITE_OTHER): Payer: Medicare Other | Admitting: Family Medicine

## 2019-10-24 VITALS — BP 135/58 | HR 54 | Ht 63.0 in | Wt 148.0 lb

## 2019-10-24 DIAGNOSIS — E7800 Pure hypercholesterolemia, unspecified: Secondary | ICD-10-CM | POA: Diagnosis not present

## 2019-10-24 DIAGNOSIS — I10 Essential (primary) hypertension: Secondary | ICD-10-CM | POA: Diagnosis not present

## 2019-10-24 DIAGNOSIS — M899 Disorder of bone, unspecified: Secondary | ICD-10-CM

## 2019-10-24 DIAGNOSIS — R21 Rash and other nonspecific skin eruption: Secondary | ICD-10-CM | POA: Diagnosis not present

## 2019-10-24 DIAGNOSIS — M949 Disorder of cartilage, unspecified: Secondary | ICD-10-CM

## 2019-10-24 MED ORDER — ATORVASTATIN CALCIUM 40 MG PO TABS: 40.0000 mg | ORAL_TABLET | Freq: Every day | ORAL | 3 refills | Status: DC

## 2019-10-24 MED ORDER — AMLODIPINE BESYLATE 5 MG PO TABS: 5.0000 mg | ORAL_TABLET | Freq: Every day | ORAL | 1 refills | Status: DC

## 2019-10-24 NOTE — Assessment & Plan Note (Signed)
Osteopenia continue with calcium and vitamin D supplement and regular exercise.  Continue Boniva.

## 2019-10-24 NOTE — Assessment & Plan Note (Signed)
Tolerating statin well.  Due to check lipids and liver enzymes.

## 2019-10-24 NOTE — Assessment & Plan Note (Signed)
Well controlled. Continue current regimen. Follow up in  6 months. Due for labs.   

## 2019-10-24 NOTE — Progress Notes (Signed)
Established Patient Office Visit  Subjective:  Patient ID: Maria Romero, female    DOB: [birth year]  Age: 69 y.o. MRN: [id number]  CC:  Chief Complaint  Patient presents with  . Hypertension    HPI Maria Romero presents for   Hypertension- Pt denies chest pain, SOB, dizziness, or heart palpitations.  Taking meds as directed w/o problems.  Denies medication side effects.    Follow-up osteoporosis -she does take a calcium with vitamin D supplement and tries to stay active she is also taking her Boniva regularly without any side effects or problems.  Hyperlipidemia - tolerating stating well with no myalgias or significant side effects.  Lab Results  Component Value Date   CHOL 162 10/17/2018   HDL 81 10/17/2018   LDLCALC 65 10/17/2018   TRIG 77 10/17/2018   CHOLHDL 2.0 10/17/2018    She also says that she noticed a petechial rash from about mid shin to ankles bilaterally she says it happened last year when she went to the beach and it happened again this year.  She is not sure what may have triggered it she does not have any irritation or itching she just noticed the redness.  She did get a little bit of swelling while she was at the beach.  She says sometimes when she gets really hot or heated it will swell a little bit more but she does not normally swell around her ankles.   Past Medical History:  Diagnosis Date  . Hypercholesterolemia   . IBS (irritable bowel syndrome)   . Osteopenia   . Osteoporosis     Past Surgical History:  Procedure Laterality Date  . lump removal  1995  . TONSILLECTOMY    . TUBAL LIGATION  1990    Family History  Problem Relation Age of Onset  . Stroke Mother   . Hypertension Mother   . Hyperlipidemia Mother   . Glaucoma Mother   . Heart attack Father 19  . Diabetes Father   . Hypertension Father   . Hyperlipidemia Father   . Cataracts Father   . Breast cancer Cousin     Social History   Socioeconomic History  . Marital  status: Widowed    Spouse name: Not on file  . Number of children: 1  . Years of education: 46  . Highest education level: Associate degree: occupational, Hotel manager, or vocational program  Occupational History  . Occupation: retired Quarry manager  Tobacco Use  . Smoking status: Former Smoker    Types: Cigarettes    Quit date: 03/20/1990    Years since quitting: 29.6  . Smokeless tobacco: Never Used  . Tobacco comment: quit 1988  Vaping Use  . Vaping Use: Never used  Substance and Sexual Activity  . Alcohol use: Yes    Alcohol/week: 1.0 - 2.0 standard drink    Types: 1 - 2 Glasses of wine per week    Comment: Wine in the evenings  . Drug use: No  . Sexual activity: Never    Partners: Male  Other Topics Concern  . Not on file  Social History Narrative   Regular exercise. Helps take care of her grandson. Gets out daily and socializes   Social Determinants of Health   Financial Resource Strain:   . Difficulty of Paying Living Expenses:   Food Insecurity:   . Worried About Charity fundraiser in the Last Year:   . [place] in the Last Year:  Transportation Needs:   . Film/video editor (Medical):   Marland Kitchen Lack of Transportation (Non-Medical):   Physical Activity:   . Days of Exercise per Week:   . Minutes of Exercise per Session:   Stress:   . Feeling of Stress :   Social Connections:   . Frequency of Communication with Friends and Family:   . Frequency of Social Gatherings with Friends and Family:   . Attends Religious Services:   . Active Member of Clubs or Organizations:   . Attends Archivist Meetings:   Marland Kitchen Marital Status:   Intimate Partner Violence:   . Fear of Current or Ex-Partner:   . Emotionally Abused:   Marland Kitchen Physically Abused:   . Sexually Abused:     Outpatient Medications Prior to Visit  Medication Sig Dispense Refill  . aspirin (ADULT ASPIRIN EC LOW STRENGTH) 81 MG EC tablet Take 81 mg by mouth daily.      . Calcium Carb-Cholecalciferol  (CALCIUM 1000 + D PO) Take 1 tablet by mouth daily.    Marland Kitchen ibandronate (BONIVA) 150 MG tablet TAKE 1 TABLET (150 MG TOTAL) BY MOUTH EVERY 30 (THIRTY) DAYS. TAKE IN THE MORNING WITH A FULL GLASS OF WATER, ON AN EMPTY STOMACH, AND DO NOT TAKE ANYTHING ELSE BY MOUTH OR LIE DOWN FOR THE NEXT 30 MIN. 3 tablet 3  . metoprolol succinate (TOPROL-XL) 25 MG 24 hr tablet TAKE 1 TABLET BY MOUTH EVERY DAY 90 tablet 1  . Omega-3 Fatty Acids (FISH OIL MAXIMUM STRENGTH) 1200 MG CAPS Take 1 capsule by mouth 2 (two) times daily.     Marland Kitchen amLODipine (NORVASC) 5 MG tablet Take 0.5-1 tablet by mouth daily. NEEDS APPOINTMENT AND LABS FOR REFILLS. (Patient taking differently: Take by mouth. Take 0.5-1 tablet by mouth daily.) 30 tablet 0  . atorvastatin (LIPITOR) 40 MG tablet Take 1 tablet (40 mg total) by mouth daily. 90 tablet 3   No facility-administered medications prior to visit.    Allergies  Allergen Reactions  . Fluvastatin Sodium     REACTION: Myalgias    ROS Review of Systems    Objective:    Physical Exam Constitutional:      Appearance: She is well-developed.  HENT:     Head: Normocephalic and atraumatic.     Nose: Nose normal.  Cardiovascular:     Rate and Rhythm: Normal rate and regular rhythm.     Heart sounds: Normal heart sounds.  Pulmonary:     Effort: Pulmonary effort is normal.     Breath sounds: Normal breath sounds.  Skin:    General: Skin is warm and dry.     Comments: Scattered petechiae on her lower legs.  No swelling around the ankles.  Neurological:     Mental Status: She is alert and oriented to person, place, and time.  Psychiatric:        Behavior: Behavior normal.     BP (!) 135/58   Pulse (!) 54   Ht 5\' 3"  (1.6 m)   Wt 148 lb (67.1 kg)   SpO2 100%   BMI 26.22 kg/m  Wt Readings from Last 3 Encounters:  10/24/19 148 lb (67.1 kg)  05/29/19 145 lb (65.8 kg)  09/17/18 143 lb (64.9 kg)     There are no preventive care reminders to display for this  patient.  There are no preventive care reminders to display for this patient.  Lab Results  Component Value Date   TSH 2.95 03/17/2017  Lab Results  Component Value Date   WBC 6.6 03/17/2017   HGB 13.9 03/17/2017   HCT 40.6 03/17/2017   MCV 91.9 03/17/2017   PLT 257 03/17/2017   Lab Results  Component Value Date   NA 140 10/17/2018   K 4.7 10/17/2018   CO2 27 10/17/2018   GLUCOSE 106 (H) 10/17/2018   BUN 18 10/17/2018   CREATININE 0.86 10/17/2018   BILITOT 0.7 10/17/2018   ALKPHOS 51 09/12/2016   AST 18 10/17/2018   ALT 15 10/17/2018   PROT 6.7 10/17/2018   ALBUMIN 4.3 09/12/2016   CALCIUM 9.6 10/17/2018   Lab Results  Component Value Date   CHOL 162 10/17/2018   Lab Results  Component Value Date   HDL 81 10/17/2018   Lab Results  Component Value Date   LDLCALC 65 10/17/2018   Lab Results  Component Value Date   TRIG 77 10/17/2018   Lab Results  Component Value Date   CHOLHDL 2.0 10/17/2018   Lab Results  Component Value Date   HGBA1C 5.2 03/19/2018      Assessment & Plan:   Problem List Items Addressed This Visit      Cardiovascular and Mediastinum   Essential hypertension - Primary    Well controlled. Continue current regimen. Follow up in  6 months. Due for labs.       Relevant Medications   amLODipine (NORVASC) 5 MG tablet   atorvastatin (LIPITOR) 40 MG tablet   Other Relevant Orders   COMPLETE METABOLIC PANEL WITH GFR   Lipid panel   CBC   TSH     Musculoskeletal and Integument   Disorder of bone and cartilage    Osteopenia continue with calcium and vitamin D supplement and regular exercise.  Continue Boniva.        Other   HYPERCHOLESTEROLEMIA    Tolerating statin well.  Due to check lipids and liver enzymes.      Relevant Medications   amLODipine (NORVASC) 5 MG tablet   atorvastatin (LIPITOR) 40 MG tablet   Other Relevant Orders   COMPLETE METABOLIC PANEL WITH GFR   Lipid panel   CBC   TSH    Other Visit Diagnoses     Rash       Relevant Orders   COMPLETE METABOLIC PANEL WITH GFR   Lipid panel   CBC   TSH     Rash-suspect related to a little bit of the swelling and venous stasis.  Also discussed that it could be a reaction to sunscreen though she is not having any itching or irritation.  It should hopefully resolve on its own.  Could consider trial compression stockings if she gets any persistent swelling.  Meds ordered this encounter  Medications  . amLODipine (NORVASC) 5 MG tablet    Sig: Take 1 tablet (5 mg total) by mouth daily.    Dispense:  90 tablet    Refill:  1    Pt will call when needed.  Marland Kitchen atorvastatin (LIPITOR) 40 MG tablet    Sig: Take 1 tablet (40 mg total) by mouth at bedtime.    Dispense:  90 tablet    Refill:  3    Pt will call when needed.    Follow-up: Return in about 6 months (around 04/25/2020) for Hypertension.    Beatrice Lecher, MD

## 2019-10-25 LAB — COMPLETE METABOLIC PANEL WITH GFR
AG Ratio: 2.3 (calc) (ref 1.0–2.5)
ALT: 24 U/L (ref 6–29)
AST: 25 U/L (ref 10–35)
Albumin: 4.8 g/dL (ref 3.6–5.1)
Alkaline phosphatase (APISO): 34 U/L — ABNORMAL LOW (ref 37–153)
BUN: 21 mg/dL (ref 7–25)
CO2: 25 mmol/L (ref 20–32)
Calcium: 10 mg/dL (ref 8.6–10.4)
Chloride: 105 mmol/L (ref 98–110)
Creat: 0.9 mg/dL (ref 0.60–0.93)
GFR, Est African American: 75 mL/min/{1.73_m2} (ref >=60)
GFR, Est Non African American: 65 mL/min/{1.73_m2} (ref >=60)
Globulin: 2.1 g/dL (calc) (ref 1.9–3.7)
Glucose, Bld: 88 mg/dL (ref 65–99)
Potassium: 4.7 mmol/L (ref 3.5–5.3)
Sodium: 140 mmol/L (ref 135–146)
Total Bilirubin: 0.8 mg/dL (ref 0.2–1.2)
Total Protein: 6.9 g/dL (ref 6.1–8.1)

## 2019-10-25 LAB — CBC
HCT: 42.7 % (ref 35.0–45.0)
Hemoglobin: 13.9 g/dL (ref 11.7–15.5)
MCH: 31 pg (ref 27.0–33.0)
MCHC: 32.6 g/dL (ref 32.0–36.0)
MCV: 95.1 fL (ref 80.0–100.0)
MPV: 11.2 fL (ref 7.5–12.5)
Platelets: 227 10*3/uL (ref 140–400)
RBC: 4.49 10*6/uL (ref 3.80–5.10)
RDW: 12.4 % (ref 11.0–15.0)
WBC: 6.5 10*3/uL (ref 3.8–10.8)

## 2019-10-25 LAB — LIPID PANEL
Cholesterol: 190 mg/dL (ref <200)
HDL: 90 mg/dL (ref >=50)
LDL Cholesterol (Calc): 78 mg/dL (calc)
Non-HDL Cholesterol (Calc): 100 mg/dL (calc) (ref <130)
Total CHOL/HDL Ratio: 2.1 (calc) (ref <5.0)
Triglycerides: 122 mg/dL (ref <150)

## 2019-10-25 LAB — TSH: TSH: 3.83 mIU/L (ref 0.40–4.50)

## 2020-01-13 DIAGNOSIS — Z23 Encounter for immunization: Secondary | ICD-10-CM | POA: Diagnosis not present

## 2020-02-10 DIAGNOSIS — Z23 Encounter for immunization: Secondary | ICD-10-CM | POA: Diagnosis not present

## 2020-02-12 ENCOUNTER — Other Ambulatory Visit: Payer: Self-pay | Admitting: Family Medicine

## 2020-02-13 ENCOUNTER — Other Ambulatory Visit: Payer: Self-pay | Admitting: Family Medicine

## 2020-02-13 DIAGNOSIS — Z1231 Encounter for screening mammogram for malignant neoplasm of breast: Secondary | ICD-10-CM

## 2020-03-04 ENCOUNTER — Other Ambulatory Visit: Payer: Self-pay | Admitting: Family Medicine

## 2020-03-04 DIAGNOSIS — I10 Essential (primary) hypertension: Secondary | ICD-10-CM

## 2020-04-15 ENCOUNTER — Ambulatory Visit (INDEPENDENT_AMBULATORY_CARE_PROVIDER_SITE_OTHER): Payer: Medicare Other

## 2020-04-15 ENCOUNTER — Other Ambulatory Visit: Payer: Self-pay

## 2020-04-15 DIAGNOSIS — Z1231 Encounter for screening mammogram for malignant neoplasm of breast: Secondary | ICD-10-CM

## 2020-04-27 ENCOUNTER — Ambulatory Visit: Payer: Medicare Other | Admitting: Family Medicine

## 2020-05-03 ENCOUNTER — Other Ambulatory Visit: Payer: Self-pay | Admitting: Family Medicine

## 2020-05-03 DIAGNOSIS — I10 Essential (primary) hypertension: Secondary | ICD-10-CM

## 2020-05-08 ENCOUNTER — Encounter: Payer: Self-pay | Admitting: Family Medicine

## 2020-05-08 ENCOUNTER — Other Ambulatory Visit: Payer: Self-pay

## 2020-05-08 ENCOUNTER — Ambulatory Visit (INDEPENDENT_AMBULATORY_CARE_PROVIDER_SITE_OTHER): Payer: Medicare Other | Admitting: Family Medicine

## 2020-05-08 VITALS — BP 134/68 | HR 56 | Ht 63.0 in | Wt 150.0 lb

## 2020-05-08 DIAGNOSIS — I10 Essential (primary) hypertension: Secondary | ICD-10-CM

## 2020-05-08 DIAGNOSIS — M899 Disorder of bone, unspecified: Secondary | ICD-10-CM | POA: Diagnosis not present

## 2020-05-08 DIAGNOSIS — M949 Disorder of cartilage, unspecified: Secondary | ICD-10-CM

## 2020-05-08 NOTE — Assessment & Plan Note (Signed)
Doing well on regimen. Encouraged more regular exercise.

## 2020-05-08 NOTE — Assessment & Plan Note (Signed)
Well controlled. Continue current regimen. Follow up in  6 mo  

## 2020-05-08 NOTE — Progress Notes (Signed)
Established Patient Office Visit  Subjective:  Patient ID: Maria Romero, female    DOB: 11-01-1950  Age: 70 y.o. MRN: [id number]  CC:  Chief Complaint  Patient presents with  . Hypertension    HPI Maria Romero presents for   Hypertension- Pt denies chest pain, SOB, dizziness, or heart palpitations.  Taking meds as directed w/o problems.  Denies medication side effects.     Osteoporosis-she is continue to take her calcium, vitamin D and Boniva though she did switch pharmacies because the price went up significantly on her [facility] so she is now using [facility] and was able to get it there.  She is not currently exercising   Past Medical History:  Diagnosis Date  . Hypercholesterolemia   . IBS (irritable bowel syndrome)   . Osteopenia   . Osteoporosis     Past Surgical History:  Procedure Laterality Date  . lump removal  1995  . TONSILLECTOMY    . TUBAL LIGATION  1990    Family History  Problem Relation Age of Onset  . Stroke Mother   . Hypertension Mother   . Hyperlipidemia Mother   . Glaucoma Mother   . Heart attack Father 68  . Diabetes Father   . Hypertension Father   . Hyperlipidemia Father   . Cataracts Father   . Breast cancer Cousin     Social History   Socioeconomic History  . Marital status: Widowed    Spouse name: Not on file  . Number of children: 1  . Years of education: 33  . Highest education level: Associate degree: occupational, Hotel manager, or vocational program  Occupational History  . Occupation: retired Quarry manager  Tobacco Use  . Smoking status: Former Smoker    Types: Cigarettes    Quit date: 03/20/1990    Years since quitting: 30.1  . Smokeless tobacco: Never Used  . Tobacco comment: quit 1988  Vaping Use  . Vaping Use: Never used  Substance and Sexual Activity  . Alcohol use: Yes    Alcohol/week: 1.0 - 2.0 standard drink    Types: 1 - 2 Glasses of wine per week    Comment: Wine in the evenings  . Drug use: No  .  Sexual activity: Never    Partners: Male  Other Topics Concern  . Not on file  Social History Narrative   Regular exercise. Helps take care of her grandson. Gets out daily and socializes   Social Determinants of Health   Financial Resource Strain: Not on file  Food Insecurity: Not on file  Transportation Needs: Not on file  Physical Activity: Not on file  Stress: Not on file  Social Connections: Not on file  Intimate Partner Violence: Not on file    Outpatient Medications Prior to Visit  Medication Sig Dispense Refill  . amLODipine (NORVASC) 5 MG tablet TAKE 1 TABLET BY MOUTH EVERY DAY 90 tablet 1  . aspirin 81 MG EC tablet Take 81 mg by mouth daily.    Marland Kitchen atorvastatin (LIPITOR) 40 MG tablet Take 1 tablet (40 mg total) by mouth at bedtime. 90 tablet 3  . Calcium Carb-Cholecalciferol (CALCIUM 1000 + D PO) Take 1 tablet by mouth daily.    Marland Kitchen ibandronate (BONIVA) 150 MG tablet TAKE 1 TABLET (150 MG TOTAL) BY MOUTH EVERY 30 (THIRTY) DAYS. TAKE IN THE MORNING WITH A FULL GLASS OF WATER, ON AN EMPTY STOMACH, AND DO NOT TAKE ANYTHING ELSE BY MOUTH OR LIE DOWN FOR THE  NEXT 30 MIN. 3 tablet 3  . metoprolol succinate (TOPROL-XL) 25 MG 24 hr tablet TAKE 1 TABLET BY MOUTH EVERY DAY 90 tablet 1  . Omega-3 Fatty Acids (FISH OIL MAXIMUM STRENGTH) 1200 MG CAPS Take 1 capsule by mouth 2 (two) times daily.     No facility-administered medications prior to visit.    Allergies  Allergen Reactions  . Fluvastatin Sodium     REACTION: Myalgias    ROS Review of Systems    Objective:    Physical Exam Constitutional:      Appearance: She is well-developed and well-nourished.  HENT:     Head: Normocephalic and atraumatic.  Cardiovascular:     Rate and Rhythm: Normal rate and regular rhythm.     Heart sounds: Normal heart sounds.  Pulmonary:     Effort: Pulmonary effort is normal.     Breath sounds: Normal breath sounds.  Skin:    General: Skin is warm and dry.  Neurological:     Mental  Status: She is alert and oriented to person, place, and time.  Psychiatric:        Mood and Affect: Mood and affect normal.        Behavior: Behavior normal.     BP 134/68   Pulse (!) 56   Ht 5\' 3"  (1.6 m)   Wt 150 lb (68 kg)   SpO2 97%   BMI 26.57 kg/m  Wt Readings from Last 3 Encounters:  05/08/20 150 lb (68 kg)  10/24/19 148 lb (67.1 kg)  05/29/19 145 lb (65.8 kg)     There are no preventive care reminders to display for this patient.  There are no preventive care reminders to display for this patient.  Lab Results  Component Value Date   TSH 3.83 10/24/2019   Lab Results  Component Value Date   WBC 6.5 10/24/2019   HGB 13.9 10/24/2019   HCT 42.7 10/24/2019   MCV 95.1 10/24/2019   PLT 227 10/24/2019   Lab Results  Component Value Date   NA 140 10/24/2019   K 4.7 10/24/2019   CO2 25 10/24/2019   GLUCOSE 88 10/24/2019   BUN 21 10/24/2019   CREATININE 0.90 10/24/2019   BILITOT 0.8 10/24/2019   ALKPHOS 51 09/12/2016   AST 25 10/24/2019   ALT 24 10/24/2019   PROT 6.9 10/24/2019   ALBUMIN 4.3 09/12/2016   CALCIUM 10.0 10/24/2019   Lab Results  Component Value Date   CHOL 190 10/24/2019   Lab Results  Component Value Date   HDL 90 10/24/2019   Lab Results  Component Value Date   LDLCALC 78 10/24/2019   Lab Results  Component Value Date   TRIG 122 10/24/2019   Lab Results  Component Value Date   CHOLHDL 2.1 10/24/2019   Lab Results  Component Value Date   HGBA1C 5.2 03/19/2018      Assessment & Plan:   Problem List Items Addressed This Visit      Cardiovascular and Mediastinum   Essential hypertension - Primary    Well controlled. Continue current regimen. Follow up in  6 mo         Musculoskeletal and Integument   Disorder of bone and cartilage    Doing well on regimen. Encouraged more regular exercise.           No orders of the defined types were placed in this encounter.   Follow-up: Return in about 6 months (around  11/05/2020) for Hypertension  and blood .    Beatrice Lecher, MD

## 2020-10-08 ENCOUNTER — Other Ambulatory Visit: Payer: Self-pay | Admitting: Family Medicine

## 2020-10-08 DIAGNOSIS — I10 Essential (primary) hypertension: Secondary | ICD-10-CM

## 2020-10-17 DIAGNOSIS — Z23 Encounter for immunization: Secondary | ICD-10-CM | POA: Diagnosis not present

## 2020-11-05 ENCOUNTER — Other Ambulatory Visit: Payer: Self-pay

## 2020-11-05 ENCOUNTER — Encounter: Payer: Self-pay | Admitting: Family Medicine

## 2020-11-05 ENCOUNTER — Ambulatory Visit (INDEPENDENT_AMBULATORY_CARE_PROVIDER_SITE_OTHER): Payer: Medicare Other | Admitting: Family Medicine

## 2020-11-05 VITALS — BP 120/67 | HR 55 | Temp 98.1°F | Ht 63.0 in | Wt 152.0 lb

## 2020-11-05 DIAGNOSIS — M949 Disorder of cartilage, unspecified: Secondary | ICD-10-CM | POA: Diagnosis not present

## 2020-11-05 DIAGNOSIS — E7800 Pure hypercholesterolemia, unspecified: Secondary | ICD-10-CM

## 2020-11-05 DIAGNOSIS — I10 Essential (primary) hypertension: Secondary | ICD-10-CM | POA: Diagnosis not present

## 2020-11-05 DIAGNOSIS — M899 Disorder of bone, unspecified: Secondary | ICD-10-CM

## 2020-11-05 MED ORDER — ATORVASTATIN CALCIUM 40 MG PO TABS: 40.0000 mg | ORAL_TABLET | Freq: Every day | ORAL | 3 refills | Status: DC

## 2020-11-05 MED ORDER — AMLODIPINE BESYLATE 5 MG PO TABS: 5.0000 mg | ORAL_TABLET | Freq: Every day | ORAL | 1 refills | Status: DC

## 2020-11-05 MED ORDER — METOPROLOL SUCCINATE ER 25 MG PO TB24: 25.0000 mg | ORAL_TABLET | Freq: Every day | ORAL | 1 refills | Status: DC

## 2020-11-05 NOTE — Assessment & Plan Note (Signed)
Well controlled. Continue current regimen. Due for labs today.

## 2020-11-05 NOTE — Assessment & Plan Note (Signed)
Continue bisphosphonate.  She is due for repeat DEXA we will plan to schedule that at the same time as her mammogram in January.

## 2020-11-05 NOTE — Addendum Note (Signed)
Addended by: Beatrice Lecher D on: 11/05/2020 09:59 AM   Modules accepted: Orders

## 2020-11-05 NOTE — Progress Notes (Addendum)
Established Patient Office Visit  Subjective:  Patient ID: Maria Romero, female    DOB: 16-Jul-1950  Age: 70 y.o. MRN: [id number]  CC:  Chief Complaint  Patient presents with   Hypertension    HPI Maria Romero presents for   Hypertension- Pt denies chest pain, SOB, dizziness, or heart palpitations.  Taking meds as directed w/o problems.  Denies medication side effects.    Hyperlipidemia - tolerating stating well with no myalgias or significant side effects.  Lab Results  Component Value Date   CHOL 190 10/24/2019   HDL 90 10/24/2019   LDLCALC 78 10/24/2019   TRIG 122 10/24/2019   CHOLHDL 2.1 10/24/2019    Eye exam schedule in the fall.    Past Medical History:  Diagnosis Date   Hypercholesterolemia    IBS (irritable bowel syndrome)    Osteopenia    Osteoporosis     Past Surgical History:  Procedure Laterality Date   lump removal  1995   TONSILLECTOMY     TUBAL LIGATION  1990    Family History  Problem Relation Age of Onset   Stroke Mother    Hypertension Mother    Hyperlipidemia Mother    Glaucoma Mother    Heart attack Father 42   Diabetes Father    Hypertension Father    Hyperlipidemia Father    Cataracts Father    Breast cancer Cousin     Social History   Socioeconomic History   Marital status: Widowed    Spouse name: Not on file   Number of children: 1   Years of education: 14   Highest education level: Associate degree: occupational, Hotel manager, or vocational program  Occupational History   Occupation: retired Quarry manager  Tobacco Use   Smoking status: Former    Types: Cigarettes    Quit date: 03/20/1990    Years since quitting: 30.6   Smokeless tobacco: Never   Tobacco comments:    quit 1988  Vaping Use   Vaping Use: Never used  Substance and Sexual Activity   Alcohol use: Yes    Alcohol/week: 1.0 - 2.0 standard drink    Types: 1 - 2 Glasses of wine per week    Comment: Wine in the evenings   Drug use: No   Sexual activity:  Never    Partners: Male  Other Topics Concern   Not on file  Social History Narrative   Regular exercise. Helps take care of her grandson. Gets out daily and socializes   Social Determinants of Health   Financial Resource Strain: Not on file  Food Insecurity: Not on file  Transportation Needs: Not on file  Physical Activity: Not on file  Stress: Not on file  Social Connections: Not on file  Intimate Partner Violence: Not on file    Outpatient Medications Prior to Visit  Medication Sig Dispense Refill   aspirin 81 MG EC tablet Take 81 mg by mouth daily.     Calcium Carb-Cholecalciferol (CALCIUM 1000 + D PO) Take 1 tablet by mouth daily.     ibandronate (BONIVA) 150 MG tablet TAKE 1 TABLET (150 MG TOTAL) BY MOUTH EVERY 30 (THIRTY) DAYS. TAKE IN THE MORNING WITH A FULL GLASS OF WATER, ON AN EMPTY STOMACH, AND DO NOT TAKE ANYTHING ELSE BY MOUTH OR LIE DOWN FOR THE NEXT 30 MIN. 3 tablet 3   Omega-3 Fatty Acids (FISH OIL MAXIMUM STRENGTH) 1200 MG CAPS Take 1 capsule by mouth 2 (two) times daily.  amLODipine (NORVASC) 5 MG tablet TAKE 1 TABLET BY MOUTH EVERY DAY 90 tablet 1   atorvastatin (LIPITOR) 40 MG tablet Take 1 tablet (40 mg total) by mouth at bedtime. 90 tablet 3   metoprolol succinate (TOPROL-XL) 25 MG 24 hr tablet TAKE ONE TABLET BY MOUTH DAILY 90 tablet 0   No facility-administered medications prior to visit.    Allergies  Allergen Reactions   Fluvastatin Sodium     REACTION: Myalgias    ROS Review of Systems    Objective:    Physical Exam Constitutional:      Appearance: Normal appearance. She is well-developed.  HENT:     Head: Normocephalic and atraumatic.  Cardiovascular:     Rate and Rhythm: Normal rate and regular rhythm.     Heart sounds: Normal heart sounds.  Pulmonary:     Effort: Pulmonary effort is normal.     Breath sounds: Normal breath sounds.  Skin:    General: Skin is warm and dry.  Neurological:     Mental Status: She is alert and  oriented to person, place, and time.  Psychiatric:        Behavior: Behavior normal.    BP 120/67   Pulse (!) 55   Temp 98.1 F (36.7 C) (Oral)   Ht '5\' 3"'$  (1.6 m)   Wt 152 lb (68.9 kg)   SpO2 100% Comment: on RA  BMI 26.93 kg/m  Wt Readings from Last 3 Encounters:  11/05/20 152 lb (68.9 kg)  05/08/20 150 lb (68 kg)  10/24/19 148 lb (67.1 kg)     There are no preventive care reminders to display for this patient.  There are no preventive care reminders to display for this patient.  Lab Results  Component Value Date   TSH 3.83 10/24/2019   Lab Results  Component Value Date   WBC 6.5 10/24/2019   HGB 13.9 10/24/2019   HCT 42.7 10/24/2019   MCV 95.1 10/24/2019   PLT 227 10/24/2019   Lab Results  Component Value Date   NA 140 10/24/2019   K 4.7 10/24/2019   CO2 25 10/24/2019   GLUCOSE 88 10/24/2019   BUN 21 10/24/2019   CREATININE 0.90 10/24/2019   BILITOT 0.8 10/24/2019   ALKPHOS 51 09/12/2016   AST 25 10/24/2019   ALT 24 10/24/2019   PROT 6.9 10/24/2019   ALBUMIN 4.3 09/12/2016   CALCIUM 10.0 10/24/2019   Lab Results  Component Value Date   CHOL 190 10/24/2019   Lab Results  Component Value Date   HDL 90 10/24/2019   Lab Results  Component Value Date   LDLCALC 78 10/24/2019   Lab Results  Component Value Date   TRIG 122 10/24/2019   Lab Results  Component Value Date   CHOLHDL 2.1 10/24/2019   Lab Results  Component Value Date   HGBA1C 5.2 03/19/2018      Assessment & Plan:   Problem List Items Addressed This Visit       Cardiovascular and Mediastinum   Essential hypertension - Primary    Well controlled. Continue current regimen. Follow up in  6 mo        Relevant Medications   amLODipine (NORVASC) 5 MG tablet   atorvastatin (LIPITOR) 40 MG tablet   metoprolol succinate (TOPROL-XL) 25 MG 24 hr tablet   Other Relevant Orders   Lipid Panel w/reflex Direct LDL   COMPLETE METABOLIC PANEL WITH GFR   CBC     Musculoskeletal and  Integument   Disorder of bone and cartilage    Continue bisphosphonate.  She is due for repeat DEXA we will plan to schedule that at the same time as her mammogram in January.       Relevant Orders   VITAMIN D 25 Hydroxy (Vit-D Deficiency, Fractures)     Other   HYPERCHOLESTEROLEMIA    Well controlled. Continue current regimen. Due for labs today.        Relevant Medications   amLODipine (NORVASC) 5 MG tablet   atorvastatin (LIPITOR) 40 MG tablet   metoprolol succinate (TOPROL-XL) 25 MG 24 hr tablet    Meds ordered this encounter  Medications   amLODipine (NORVASC) 5 MG tablet    Sig: Take 1 tablet (5 mg total) by mouth daily.    Dispense:  90 tablet    Refill:  1   atorvastatin (LIPITOR) 40 MG tablet    Sig: Take 1 tablet (40 mg total) by mouth at bedtime.    Dispense:  90 tablet    Refill:  3    Pt will call when needed.   metoprolol succinate (TOPROL-XL) 25 MG 24 hr tablet    Sig: Take 1 tablet (25 mg total) by mouth daily.    Dispense:  90 tablet    Refill:  1     Follow-up: Return in about 6 months (around 05/08/2021) for Hypertension.    Beatrice Lecher, MD

## 2020-11-05 NOTE — Assessment & Plan Note (Signed)
Well controlled. Continue current regimen. Follow up in  6 mo  

## 2020-11-06 LAB — COMPLETE METABOLIC PANEL WITH GFR
AG Ratio: 2 (calc) (ref 1.0–2.5)
ALT: 29 U/L (ref 6–29)
AST: 23 U/L (ref 10–35)
Albumin: 4.8 g/dL (ref 3.6–5.1)
Alkaline phosphatase (APISO): 45 U/L (ref 37–153)
BUN: 22 mg/dL (ref 7–25)
CO2: 26 mmol/L (ref 20–32)
Calcium: 9.6 mg/dL (ref 8.6–10.4)
Chloride: 104 mmol/L (ref 98–110)
Creat: 0.91 mg/dL (ref 0.60–1.00)
Globulin: 2.4 g/dL (calc) (ref 1.9–3.7)
Glucose, Bld: 92 mg/dL (ref 65–99)
Potassium: 4.7 mmol/L (ref 3.5–5.3)
Sodium: 139 mmol/L (ref 135–146)
Total Bilirubin: 0.7 mg/dL (ref 0.2–1.2)
Total Protein: 7.2 g/dL (ref 6.1–8.1)
eGFR: 67 mL/min/{1.73_m2} (ref >=60)

## 2020-11-06 LAB — CBC
HCT: 44.1 % (ref 35.0–45.0)
Hemoglobin: 14.6 g/dL (ref 11.7–15.5)
MCH: 31.3 pg (ref 27.0–33.0)
MCHC: 33.1 g/dL (ref 32.0–36.0)
MCV: 94.6 fL (ref 80.0–100.0)
MPV: 11 fL (ref 7.5–12.5)
Platelets: 237 10*3/uL (ref 140–400)
RBC: 4.66 10*6/uL (ref 3.80–5.10)
RDW: 12.5 % (ref 11.0–15.0)
WBC: 5.5 10*3/uL (ref 3.8–10.8)

## 2020-11-06 LAB — LIPID PANEL W/REFLEX DIRECT LDL
Cholesterol: 202 mg/dL — ABNORMAL HIGH (ref <200)
HDL: 92 mg/dL (ref >=50)
LDL Cholesterol (Calc): 93 mg/dL (calc)
Non-HDL Cholesterol (Calc): 110 mg/dL (calc) (ref <130)
Total CHOL/HDL Ratio: 2.2 (calc) (ref <5.0)
Triglycerides: 78 mg/dL (ref <150)

## 2020-11-06 LAB — VITAMIN D 25 HYDROXY (VIT D DEFICIENCY, FRACTURES): Vit D, 25-Hydroxy: 42 ng/mL (ref 30–100)

## 2021-01-11 DIAGNOSIS — Z23 Encounter for immunization: Secondary | ICD-10-CM | POA: Diagnosis not present

## 2021-02-21 ENCOUNTER — Other Ambulatory Visit: Payer: Self-pay | Admitting: Family Medicine

## 2021-03-10 ENCOUNTER — Other Ambulatory Visit: Payer: Self-pay | Admitting: Family Medicine

## 2021-03-10 DIAGNOSIS — Z1231 Encounter for screening mammogram for malignant neoplasm of breast: Secondary | ICD-10-CM

## 2021-03-17 DIAGNOSIS — H52223 Regular astigmatism, bilateral: Secondary | ICD-10-CM | POA: Diagnosis not present

## 2021-03-17 DIAGNOSIS — Z961 Presence of intraocular lens: Secondary | ICD-10-CM | POA: Diagnosis not present

## 2021-03-17 DIAGNOSIS — H524 Presbyopia: Secondary | ICD-10-CM | POA: Diagnosis not present

## 2021-03-17 DIAGNOSIS — H43813 Vitreous degeneration, bilateral: Secondary | ICD-10-CM | POA: Diagnosis not present

## 2021-03-19 ENCOUNTER — Other Ambulatory Visit: Payer: Self-pay | Admitting: General Practice

## 2021-03-19 DIAGNOSIS — Z780 Asymptomatic menopausal state: Secondary | ICD-10-CM

## 2021-03-19 NOTE — Progress Notes (Signed)
Patient is going for a mammogram on 04/21/21 and she is also due for a bone density scan. She wanted to know if we can send that referral in so she can have both on the same day.

## 2021-03-26 ENCOUNTER — Ambulatory Visit (INDEPENDENT_AMBULATORY_CARE_PROVIDER_SITE_OTHER): Payer: Medicare Other | Admitting: Family Medicine

## 2021-03-26 DIAGNOSIS — Z0000 Encounter for general adult medical examination without abnormal findings: Secondary | ICD-10-CM

## 2021-03-26 NOTE — Patient Instructions (Addendum)
[facility] Maintenance Summary and Written Plan of Care  Maria Romero ,  Thank you for allowing me to perform your Medicare Annual Wellness Visit and for your ongoing commitment to your health.   Health Maintenance & Immunization History Health Maintenance  Topic Date Due   COVID-19 Vaccine (5 - Booster for Moderna series) 04/11/2021 (Originally 12/12/2020)   COLONOSCOPY (Pts 45-16yrs Insurance coverage will need to be confirmed)  01/23/2022   MAMMOGRAM  04/15/2022   TETANUS/TDAP  04/23/2024   Pneumonia Vaccine 36+ Years old  Completed   INFLUENZA VACCINE  Completed   DEXA SCAN  Completed   Hepatitis C Screening  Completed   Zoster Vaccines- Shingrix  Completed   HPV VACCINES  Aged Out   Immunization History  Administered Date(s) Administered   Fluad Quad(high Dose 65+) 12/15/2018   Influenza Whole 01/15/2009, 01/28/2010, 12/14/2010   Influenza, High Dose Seasonal PF 11/27/2016, 01/25/2018, 02/10/2020   Influenza,inj,Quad PF,6+ Mos 02/29/2012, 12/06/2012, 01/14/2014, 02/09/2015, 03/18/2016   Influenza-Unspecified 01/09/2019, 01/23/2021   Moderna Sars-Covid-2 Vaccination 05/10/2019, 06/10/2019, 02/10/2020, 10/17/2020   Pneumococcal Conjugate-13 04/23/2014   Pneumococcal Polysaccharide-23 09/16/2015   Td 08/10/2006   Tdap 04/23/2014   Zoster Recombinat (Shingrix) 09/09/2016, 03/17/2017   Zoster, Live 12/24/2009    These are the patient goals that we discussed:  Goals Addressed              This Visit's Progress     Patient Stated (pt-stated)        Would like to loose 15 lbs.        This is a list of Health Maintenance Items that are overdue or due now: Scheduled for Mammogram and Bone density  Orders/Referrals Placed Today: No orders of the defined types were placed in this encounter.  (Contact our referral department at [phone] if you have not spoken with someone about your referral appointment within the  next 5 days)    Follow-up Plan Follow-up with Hali Marry, MD as planned Medicare wellness visit in one year. AVS printed and mailed to the patient.      Health Maintenance, Female Adopting a healthy lifestyle and getting preventive care are important in promoting health and wellness. Ask your health care provider about: The right schedule for you to have regular tests and exams. Things you can do on your own to prevent diseases and keep yourself healthy. What should I know about diet, weight, and exercise? Eat a healthy diet  Eat a diet that includes plenty of vegetables, fruits, low-fat dairy products, and lean protein. Do not eat a lot of foods that are high in solid fats, added sugars, or sodium. Maintain a healthy weight Body mass index (BMI) is used to identify weight problems. It estimates body fat based on height and weight. Your health care provider can help determine your BMI and help you achieve or maintain a healthy weight. Get regular exercise Get regular exercise. This is one of the most important things you can do for your health. Most adults should: Exercise for at least 150 minutes each week. The exercise should increase your heart rate and make you sweat (moderate-intensity exercise). Do strengthening exercises at least twice a week. This is in addition to the moderate-intensity exercise. Spend less time sitting. Even light physical activity can be beneficial. Watch cholesterol and blood lipids Have your blood tested for lipids and cholesterol at 72 years of age, then have this test every 5 years. Have your cholesterol levels checked more  often if: Your lipid or cholesterol levels are high. You are older than 72 years of age. You are at high risk for heart disease. What should I know about cancer screening? Depending on your health history and family history, you may need to have cancer screening at various ages. This may include screening for: Breast  cancer. Cervical cancer. Colorectal cancer. Skin cancer. Lung cancer. What should I know about heart disease, diabetes, and high blood pressure? Blood pressure and heart disease High blood pressure causes heart disease and increases the risk of stroke. This is more likely to develop in people who have high blood pressure readings or are overweight. Have your blood pressure checked: Every 3-5 years if you are 68-60 years of age. Every year if you are 42 years old or older. Diabetes Have regular diabetes screenings. This checks your fasting blood sugar level. Have the screening done: Once every three years after age 67 if you are at a normal weight and have a low risk for diabetes. More often and at a younger age if you are overweight or have a high risk for diabetes. What should I know about preventing infection? Hepatitis B If you have a higher risk for hepatitis B, you should be screened for this virus. Talk with your health care provider to find out if you are at risk for hepatitis B infection. Hepatitis C Testing is recommended for: Everyone born from [birth year] through 1967. Anyone with known risk factors for hepatitis C. Sexually transmitted infections (STIs) Get screened for STIs, including gonorrhea and chlamydia, if: You are sexually active and are younger than 72 years of age. You are older than 72 years of age and your health care provider tells you that you are at risk for this type of infection. Your sexual activity has changed since you were last screened, and you are at increased risk for chlamydia or gonorrhea. Ask your health care provider if you are at risk. Ask your health care provider about whether you are at high risk for HIV. Your health care provider may recommend a prescription medicine to help prevent HIV infection. If you choose to take medicine to prevent HIV, you should first get tested for HIV. You should then be tested every 3 months for as long as you are taking  the medicine. Pregnancy If you are about to stop having your period (premenopausal) and you may become pregnant, seek counseling before you get pregnant. Take 400 to 800 micrograms (mcg) of folic acid every day if you become pregnant. Ask for birth control (contraception) if you want to prevent pregnancy. Osteoporosis and menopause Osteoporosis is a disease in which the bones lose minerals and strength with aging. This can result in bone fractures. If you are 79 years old or older, or if you are at risk for osteoporosis and fractures, ask your health care provider if you should: Be screened for bone loss. Take a calcium or vitamin D supplement to lower your risk of fractures. Be given hormone replacement therapy (HRT) to treat symptoms of menopause. Follow these instructions at home: Alcohol use Do not drink alcohol if: Your health care provider tells you not to drink. You are pregnant, may be pregnant, or are planning to become pregnant. If you drink alcohol: Limit how much you have to: 0-1 drink a day. Know how much alcohol is in your drink. In the [place] one drink equals one 12 oz bottle of beer (355 mL), one 5 oz glass of wine (148 mL),  or one 1 oz glass of hard liquor (44 mL). Lifestyle Do not use any products that contain nicotine or tobacco. These products include cigarettes, chewing tobacco, and vaping devices, such as e-cigarettes. If you need help quitting, ask your health care provider. Do not use street drugs. Do not share needles. Ask your health care provider for help if you need support or information about quitting drugs. General instructions Schedule regular health, dental, and eye exams. Stay current with your vaccines. Tell your health care provider if: You often feel depressed. You have ever been abused or do not feel safe at home. Summary Adopting a healthy lifestyle and getting preventive care are important in promoting health and wellness. Follow your health  care provider's instructions about healthy diet, exercising, and getting tested or screened for diseases. Follow your health care provider's instructions on monitoring your cholesterol and blood pressure. This information is not intended to replace advice given to you by your health care provider. Make sure you discuss any questions you have with your health care provider. Document Revised: 08/17/2020 Document Reviewed: 08/17/2020 Elsevier Patient Education  [employer].

## 2021-03-26 NOTE — Progress Notes (Signed)
MEDICARE ANNUAL WELLNESS VISIT  03/26/2021  Telephone Visit Disclaimer This Medicare AWV was conducted by telephone due to national recommendations for restrictions regarding the COVID-19 Pandemic (e.g. social distancing).  I verified, using two identifiers, that I am speaking with Maria Romero or their authorized healthcare agent. I discussed the limitations, risks, security, and privacy concerns of performing an evaluation and management service by telephone and the potential availability of an in-person appointment in the future. The patient expressed understanding and agreed to proceed.  Location of Patient: Home Location of Provider (nurse):  In the office.  Subjective:    Maria Romero is a 70 y.o. female patient of Metheney, Rene Kocher, MD who had a Medicare Annual Wellness Visit today via telephone. Maria Romero is Retired and lives alone. she has 1 child. she reports that she is socially active and does interact with friends/family regularly. she is minimally physically active and enjoys spending time with her family.  Patient Care Team: Hali Marry, MD as PCP - General  Advanced Directives 03/26/2021 05/29/2019 05/15/2018  Does Patient Have a Medical Advance Directive? Yes Yes Yes  Type of Advance Directive Living will;Healthcare Power of [place];Living will [place];Living will  Does patient want to make changes to medical advance directive? No - Patient declined No - Patient declined No - Patient declined  Copy of [place] in Chart? No - copy requested No - copy requested No - copy requested    Hospital Utilization Over the Past 12 Months: # of hospitalizations or ER visits: 0 # of surgeries: 0  Review of Systems    Patient reports that her overall health is unchanged compared to last year.  History obtained from chart review and the patient  Patient Reported Readings (BP, Pulse, CBG,  Weight, etc) none  Pain Assessment Pain : No/denies pain     Current Medications & Allergies (verified) Allergies as of 03/26/2021       Reactions   Fluvastatin Sodium    REACTION: Myalgias        Medication List        Accurate as of March 26, 2021 10:34 AM. If you have any questions, ask your nurse or doctor.          amLODipine 5 MG tablet Commonly known as: NORVASC Take 1 tablet (5 mg total) by mouth daily.   aspirin 81 MG EC tablet Take 81 mg by mouth daily.   atorvastatin 40 MG tablet Commonly known as: LIPITOR Take 1 tablet (40 mg total) by mouth at bedtime.   CALCIUM 1000 + D PO Take 1 tablet by mouth daily.   cholecalciferol 25 MCG (1000 UNIT) tablet Commonly known as: VITAMIN D3 Take 1,000 Units by mouth daily.   Fish Oil Maximum Strength 1200 MG Caps Take 1 capsule by mouth 2 (two) times daily.   ibandronate 150 MG tablet Commonly known as: BONIVA TAKE ONE TABLET BY MOUTH ONCE MONTHLY   metoprolol succinate 25 MG 24 hr tablet Commonly known as: TOPROL-XL Take 1 tablet (25 mg total) by mouth daily.        History (reviewed): Past Medical History:  Diagnosis Date   Hypercholesterolemia    IBS (irritable bowel syndrome)    Osteopenia    Osteoporosis    Past Surgical History:  Procedure Laterality Date   lump removal  [place]   Family History  Problem Relation Age of Onset   Stroke Mother    Hypertension Mother    Hyperlipidemia Mother    Glaucoma Mother    Heart attack Father 57   Diabetes Father    Hypertension Father    Hyperlipidemia Father    Cataracts Father    Breast cancer Cousin    Social History   Socioeconomic History   Marital status: Widowed    Spouse name: Not on file   Number of children: 1   Years of education: 14   Highest education level: Associate degree: occupational, Hotel manager, or vocational program  Occupational History   Occupation: retired Quarry manager   Tobacco Use   Smoking status: Former    Types: Cigarettes    Quit date: 03/20/1990    Years since quitting: 31.0   Smokeless tobacco: Never   Tobacco comments:    quit 1988  Vaping Use   Vaping Use: Never used  Substance and Sexual Activity   Alcohol use: Yes    Alcohol/week: 2.0 standard drinks    Types: 2 Glasses of wine per week    Comment: Wine in the evenings   Drug use: No   Sexual activity: Never    Partners: Male  Other Topics Concern   Not on file  Social History Narrative   Lives alone with her dog. Helps with her grandson pick up and volunteering at his school. She enjoys spending time with her family.   Social Determinants of Health   Financial Resource Strain: Low Risk    Difficulty of Paying Living Expenses: Not hard at all  Food Insecurity: No Food Insecurity   Worried About Charity fundraiser in the Last Year: Never true   [place] in the Last Year: Never true  Transportation Needs: No Transportation Needs   Lack of Transportation (Medical): No   Lack of Transportation (Non-Medical): No  Physical Activity: Inactive   Days of Exercise per Week: 0 days   Minutes of Exercise per Session: 0 min  Stress: No Stress Concern Present   Feeling of Stress : Not at all  Social Connections: Socially Isolated   Frequency of Communication with Friends and Family: More than three times a week   Frequency of Social Gatherings with Friends and Family: Three times a week   Attends Religious Services: Never   Active Member of Clubs or Organizations: No   Attends Archivist Meetings: Never   Marital Status: Widowed    Activities of Daily Living In your present state of health, do you have any difficulty performing the following activities: 03/26/2021  Hearing? N  Vision? N  Difficulty concentrating or making decisions? N  Walking or climbing stairs? N  Dressing or bathing? N  Doing errands, shopping? N  Preparing Food and eating ? N  Using the  Toilet? N  In the past six months, have you accidently leaked urine? Y  Comment sometimes with cough  Do you have problems with loss of bowel control? N  Managing your Medications? N  Managing your Finances? N  Housekeeping or managing your Housekeeping? N  Some recent data might be hidden    Patient Education/ Literacy How often do you need to have someone help you when you read instructions, pamphlets, or other written materials from your doctor or pharmacy?: 1 - Never What is the last grade level you completed in school?: Associates degree  Exercise Current Exercise Habits: Home exercise routine, Type of exercise: walking, Time (  Minutes): 10, Frequency (Times/Week): 7, Weekly Exercise (Minutes/Week): 70, Intensity: Mild, Exercise limited by: neurologic condition(s)  Diet Patient reports consuming 3 meals a day and 1 snack(s) a day Patient reports that her primary diet is: Regular Patient reports that she does have regular access to food.   Depression Screen PHQ 2/9 Scores 03/26/2021 11/05/2020 05/29/2019 09/17/2018 05/15/2018 09/15/2017 03/17/2017  PHQ - 2 Score 0 0 0 0 0 0 0     Fall Risk Fall Risk  11/05/2020 05/29/2019 05/15/2018 02/27/2018 03/17/2017  Falls in the past year? 0 0 0 0 No  Comment - - - Emmi Telephone Survey: data to providers prior to load -  Risk for fall due to : - No Fall Risks - - -  Follow up Falls evaluation completed Falls prevention discussed Falls prevention discussed - -     Objective:  Maria Romero seemed alert and oriented and she participated appropriately during our telephone visit.  Blood Pressure Weight BMI  BP Readings from Last 3 Encounters:  11/05/20 120/67  05/08/20 134/68  10/24/19 (!) 135/58   Wt Readings from Last 3 Encounters:  11/05/20 152 lb (68.9 kg)  05/08/20 150 lb (68 kg)  10/24/19 148 lb (67.1 kg)   BMI Readings from Last 1 Encounters:  11/05/20 26.93 kg/m    *Unable to obtain current vital signs, weight, and BMI due to  telephone visit type  Hearing/Vision  Maria Romero did not seem to have difficulty with hearing/understanding during the telephone conversation Reports that she has had a formal eye exam by an eye care professional within the past year Reports that she has not had a formal hearing evaluation within the past year *Unable to fully assess hearing and vision during telephone visit type  Cognitive Function: 6CIT Screen 03/26/2021 05/29/2019 05/15/2018  What Year? 0 points 0 points 0 points  What month? 0 points 0 points 0 points  What time? 0 points 0 points 0 points  Count back from 20 0 points 0 points 0 points  Months in reverse 0 points 0 points 0 points  Repeat phrase 0 points 0 points 0 points  Total Score 0 0 0   (Normal:0-7, Significant for Dysfunction: >8)  Normal Cognitive Function Screening: Yes   Immunization & Health Maintenance Record Immunization History  Administered Date(s) Administered   Fluad Quad(high Dose 65+) 12/15/2018   Influenza Whole 01/15/2009, 01/28/2010, 12/14/2010   Influenza, High Dose Seasonal PF 11/27/2016, 01/25/2018, 02/10/2020   Influenza,inj,Quad PF,6+ Mos 02/29/2012, 12/06/2012, 01/14/2014, 02/09/2015, 03/18/2016   Influenza-Unspecified 01/09/2019, 01/23/2021   Moderna Sars-Covid-2 Vaccination 05/10/2019, 06/10/2019, 02/10/2020, 10/17/2020   Pneumococcal Conjugate-13 04/23/2014   Pneumococcal Polysaccharide-23 09/16/2015   Td 08/10/2006   Tdap 04/23/2014   Zoster Recombinat (Shingrix) 09/09/2016, 03/17/2017   Zoster, Live 12/24/2009    Health Maintenance  Topic Date Due   COVID-19 Vaccine (5 - Booster for Moderna series) 04/11/2021 (Originally 12/12/2020)   COLONOSCOPY (Pts 45-68yrs Insurance coverage will need to be confirmed)  01/23/2022   MAMMOGRAM  04/15/2022   TETANUS/TDAP  04/23/2024   Pneumonia Vaccine 43+ Years old  Completed   INFLUENZA VACCINE  Completed   DEXA SCAN  Completed   Hepatitis C Screening  Completed   Zoster Vaccines-  Shingrix  Completed   HPV VACCINES  Aged Out       Assessment  This is a routine wellness examination for Maria Romero.  Health Maintenance: Due or Overdue There are no preventive care reminders to display for this patient.  Maria Romero does not need a referral for Community Assistance: Care Management:   no Social Work:    no Prescription Assistance:  no Nutrition/Diabetes Education:  no   Plan:  Personalized Goals  Goals Addressed               This Visit's Progress     Patient Stated (pt-stated)        Would like to loose 15 lbs.       Personalized Health Maintenance & Screening Recommendations  Influenza vaccine Scheduled for Mammogram and Bone density  Lung Cancer Screening Recommended: no (Low Dose CT Chest recommended if Age 45-80 years, 30 pack-year currently smoking OR have quit w/in past 15 years) Hepatitis C Screening recommended: no HIV Screening recommended: no  Advanced Directives: Written information was not prepared per patient's request.  Referrals & Orders No orders of the defined types were placed in this encounter.   Follow-up Plan Follow-up with Hali Marry, MD as planned Medicare wellness visit in one year. AVS printed and mailed to the patient.   I have personally reviewed and noted the following in the patients chart:   Medical and social history Use of alcohol, tobacco or illicit drugs  Current medications and supplements Functional ability and status Nutritional status Physical activity Advanced directives List of other physicians Hospitalizations, surgeries, and ER visits in previous 12 months Vitals Screenings to include cognitive, depression, and falls Referrals and appointments  In addition, I have reviewed and discussed with Maria Romero certain preventive protocols, quality metrics, and best practice recommendations. A written personalized care plan for preventive services as well as general  preventive health recommendations is available and can be mailed to the patient at her request.      Tinnie Gens, RN  03/26/2021

## 2021-04-21 ENCOUNTER — Ambulatory Visit: Payer: Medicare Other

## 2021-04-21 ENCOUNTER — Other Ambulatory Visit: Payer: Medicare Other

## 2021-04-28 ENCOUNTER — Ambulatory Visit: Payer: Self-pay

## 2021-04-28 ENCOUNTER — Other Ambulatory Visit: Payer: Self-pay

## 2021-05-05 ENCOUNTER — Other Ambulatory Visit: Payer: Self-pay

## 2021-05-05 ENCOUNTER — Ambulatory Visit (INDEPENDENT_AMBULATORY_CARE_PROVIDER_SITE_OTHER): Payer: Medicare Other

## 2021-05-05 ENCOUNTER — Encounter: Payer: Self-pay | Admitting: Family Medicine

## 2021-05-05 DIAGNOSIS — Z780 Asymptomatic menopausal state: Secondary | ICD-10-CM | POA: Diagnosis not present

## 2021-05-05 DIAGNOSIS — Z1231 Encounter for screening mammogram for malignant neoplasm of breast: Secondary | ICD-10-CM | POA: Diagnosis not present

## 2021-05-05 DIAGNOSIS — M8588 Other specified disorders of bone density and structure, other site: Secondary | ICD-10-CM | POA: Diagnosis not present

## 2021-05-05 DIAGNOSIS — M810 Age-related osteoporosis without current pathological fracture: Secondary | ICD-10-CM | POA: Diagnosis not present

## 2021-05-05 NOTE — Progress Notes (Signed)
Hi Maria Romero, bone density test shows that you have a T score of -3.0 which is consistent with osteoporosis.   The current recommendation for osteoporosis treatment includes:   #1 calcium-total of 1200 mg of calcium daily.  If you eat a very calcium rich diet you may be able to obtain that without a supplement.  If not, then I recommend calcium 500 mg twice a day.  There are several products over-the-counter such as Caltrate D and Viactiv chews which are great options that contain calcium and vitamin D. #2 vitamin D-recommend 800 international units daily. #3 exercise-recommend 30 minutes of weightbearing exercise 3 days a week.  Resistance training ,such as doing bands and light weights, can be particularly helpful. #4 medication-continue your bone builder, [name] to repeat bone density in 2 years.

## 2021-05-05 NOTE — Progress Notes (Signed)
Please call patient. Normal mammogram.  Repeat in 1 year.  

## 2021-05-10 ENCOUNTER — Other Ambulatory Visit: Payer: Self-pay

## 2021-05-10 ENCOUNTER — Ambulatory Visit (INDEPENDENT_AMBULATORY_CARE_PROVIDER_SITE_OTHER): Payer: Medicare Other | Admitting: Family Medicine

## 2021-05-10 ENCOUNTER — Telehealth: Payer: Self-pay | Admitting: Family Medicine

## 2021-05-10 ENCOUNTER — Encounter: Payer: Self-pay | Admitting: Family Medicine

## 2021-05-10 VITALS — BP 142/72 | HR 53 | Resp 16 | Ht 62.0 in | Wt 151.0 lb

## 2021-05-10 DIAGNOSIS — I10 Essential (primary) hypertension: Secondary | ICD-10-CM

## 2021-05-10 DIAGNOSIS — I7300 Raynaud's syndrome without gangrene: Secondary | ICD-10-CM | POA: Diagnosis not present

## 2021-05-10 DIAGNOSIS — E7800 Pure hypercholesterolemia, unspecified: Secondary | ICD-10-CM | POA: Diagnosis not present

## 2021-05-10 DIAGNOSIS — M810 Age-related osteoporosis without current pathological fracture: Secondary | ICD-10-CM | POA: Diagnosis not present

## 2021-05-10 MED ORDER — AMLODIPINE BESYLATE 5 MG PO TABS: 5.0000 mg | ORAL_TABLET | Freq: Every day | ORAL | 1 refills | Status: DC

## 2021-05-10 MED ORDER — METOPROLOL SUCCINATE ER 25 MG PO TB24: 25.0000 mg | ORAL_TABLET | Freq: Every day | ORAL | 1 refills | Status: DC

## 2021-05-10 NOTE — Telephone Encounter (Signed)
Patient advised of message and verbalized understanding. Patient okay with switching her BP medication. Patient will schedule nurse visit appointment in 2 weeks to recheck BP and BMP. Forward to Dr. Madilyn Fireman.

## 2021-05-10 NOTE — Progress Notes (Signed)
Patient will record 2 week of BP readings and will contact our office with her readings.

## 2021-05-10 NOTE — Telephone Encounter (Signed)
Call pt: Please see how she would feel about changing her blood pressure medication around.  I like to get rid of the metoprolol and switch her to an ARB such as valsartan or losartan or omelsartan.  It is more effective for blood pressure control and will not reduce her heart rate.  If she is okay with the change then please let me know I can send that prescription over.  When she switches medication she can come back in in 2 weeks for nurse visit and a BMP

## 2021-05-10 NOTE — Assessment & Plan Note (Addendum)
Bedside been trending up.  Currently on fish oil and atorvastatin 40 mg.  We will plan to recheck LDL today.  He is on pharmacy records it does not look like she is actually been filling the atorvastatin.

## 2021-05-10 NOTE — Assessment & Plan Note (Signed)
Continue Boniva and add in some resistance/weight training to her regimen.

## 2021-05-10 NOTE — Assessment & Plan Note (Signed)
Repeat blood pressure was still little elevated.  Currently on metoprolol 25 mg and amlodipine 5 mg.  Consider discontinuing the metoprolol and switching to an ARB.

## 2021-05-10 NOTE — Progress Notes (Signed)
Established Patient Office Visit  Subjective:  Patient ID: Maria Romero, female    DOB: 04/01/1951  Age: 71 y.o. MRN: [id number]  CC:  Chief Complaint  Patient presents with   Hypertension    Follow up     HPI Maria Romero presents for   Hypertension- Pt denies chest pain, SOB, dizziness, or heart palpitations.  Taking meds as directed w/o problems.  Denies medication side effects.    She is doing well overall.  She did have a few questions about her recent bone density.  She is already on Boniva she has not started integrating some resistance training such as bands or light weights into her routine yet but she does have them at home.  She is taking a Caltrate in the morning and a Viactiv chew in the evening.  Mammogram was performed January 25.   Past Medical History:  Diagnosis Date   Hypercholesterolemia    IBS (irritable bowel syndrome)    Osteopenia    Osteoporosis     Past Surgical History:  Procedure Laterality Date   lump removal  1995   TONSILLECTOMY     TUBAL LIGATION  1990    Family History  Problem Relation Age of Onset   Stroke Mother    Hypertension Mother    Hyperlipidemia Mother    Glaucoma Mother    Heart attack Father 75   Diabetes Father    Hypertension Father    Hyperlipidemia Father    Cataracts Father    Breast cancer Cousin     Social History   Socioeconomic History   Marital status: Widowed    Spouse name: Not on file   Number of children: 1   Years of education: 14   Highest education level: Associate degree: occupational, Hotel manager, or vocational program  Occupational History   Occupation: retired Quarry manager  Tobacco Use   Smoking status: Former    Types: Cigarettes    Quit date: 03/20/1990    Years since quitting: 31.1   Smokeless tobacco: Never   Tobacco comments:    quit 1988  Vaping Use   Vaping Use: Never used  Substance and Sexual Activity   Alcohol use: Yes    Alcohol/week: 2.0 standard drinks    Types:  2 Glasses of wine per week    Comment: Wine in the evenings   Drug use: No   Sexual activity: Never    Partners: Male  Other Topics Concern   Not on file  Social History Narrative   Lives alone with her dog. Helps with her grandson pick up and volunteering at his school. She enjoys spending time with her family.   Social Determinants of Health   Financial Resource Strain: Low Risk    Difficulty of Paying Living Expenses: Not hard at all  Food Insecurity: No Food Insecurity   Worried About Charity fundraiser in the Last Year: Never true   [place] in the Last Year: Never true  Transportation Needs: No Transportation Needs   Lack of Transportation (Medical): No   Lack of Transportation (Non-Medical): No  Physical Activity: Inactive   Days of Exercise per Week: 0 days   Minutes of Exercise per Session: 0 min  Stress: No Stress Concern Present   Feeling of Stress : Not at all  Social Connections: Socially Isolated   Frequency of Communication with Friends and Family: More than three times a week   Frequency of Social Gatherings with  Friends and Family: Three times a week   Attends Religious Services: Never   Active Member of Clubs or Organizations: No   Attends Archivist Meetings: Never   Marital Status: Widowed  Intimate Partner Violence: Unknown   Fear of Current or Ex-Partner: No   Emotionally Abused: No   Physically Abused: No   Sexually Abused: Not on file    Outpatient Medications Prior to Visit  Medication Sig Dispense Refill   aspirin 81 MG EC tablet Take 81 mg by mouth daily.     atorvastatin (LIPITOR) 40 MG tablet Take 1 tablet (40 mg total) by mouth at bedtime. 90 tablet 3   Calcium Carb-Cholecalciferol (CALCIUM 1000 + D PO) Take 1 tablet by mouth daily.     cholecalciferol (VITAMIN D3) 25 MCG (1000 UNIT) tablet Take 1,000 Units by mouth daily.     ibandronate (BONIVA) 150 MG tablet TAKE ONE TABLET BY MOUTH ONCE MONTHLY 3 tablet 3   Omega-3  Fatty Acids (FISH OIL MAXIMUM STRENGTH) 1200 MG CAPS Take 1 capsule by mouth 2 (two) times daily.     amLODipine (NORVASC) 5 MG tablet Take 1 tablet (5 mg total) by mouth daily. 90 tablet 1   metoprolol succinate (TOPROL-XL) 25 MG 24 hr tablet Take 1 tablet (25 mg total) by mouth daily. 90 tablet 1   No facility-administered medications prior to visit.    Allergies  Allergen Reactions   Fluvastatin Sodium     REACTION: Myalgias    ROS Review of Systems    Objective:    Physical Exam Constitutional:      Appearance: Normal appearance. She is well-developed.  HENT:     Head: Normocephalic and atraumatic.  Cardiovascular:     Rate and Rhythm: Normal rate and regular rhythm.     Heart sounds: Normal heart sounds.  Pulmonary:     Effort: Pulmonary effort is normal.     Breath sounds: Normal breath sounds.  Skin:    General: Skin is warm and dry.  Neurological:     Mental Status: She is alert and oriented to person, place, and time.  Psychiatric:        Behavior: Behavior normal.    BP (!) 142/72 (BP Location: Left Arm)    Pulse (!) 53    Resp 16    Ht _0  (1.575 m)    Wt 151 lb (68.5 kg)    BMI 27.62 kg/m  Wt Readings from Last 3 Encounters:  05/10/21 151 lb (68.5 kg)  11/05/20 152 lb (68.9 kg)  05/08/20 150 lb (68 kg)     There are no preventive care reminders to display for this patient.   There are no preventive care reminders to display for this patient.  Lab Results  Component Value Date   TSH 3.83 10/24/2019   Lab Results  Component Value Date   WBC 5.5 11/05/2020   HGB 14.6 11/05/2020   HCT 44.1 11/05/2020   MCV 94.6 11/05/2020   PLT 237 11/05/2020   Lab Results  Component Value Date   NA 139 11/05/2020   K 4.7 11/05/2020   CO2 26 11/05/2020   GLUCOSE 92 11/05/2020   BUN 22 11/05/2020   CREATININE 0.91 11/05/2020   BILITOT 0.7 11/05/2020   ALKPHOS 51 09/12/2016   AST 23 11/05/2020   ALT 29 11/05/2020   PROT 7.2 11/05/2020   ALBUMIN 4.3  09/12/2016   CALCIUM 9.6 11/05/2020   EGFR 67 11/05/2020   Lab  Results  Component Value Date   CHOL 202 (H) 11/05/2020   Lab Results  Component Value Date   HDL 92 11/05/2020   Lab Results  Component Value Date   LDLCALC 93 11/05/2020   Lab Results  Component Value Date   TRIG 78 11/05/2020   Lab Results  Component Value Date   CHOLHDL 2.2 11/05/2020   Lab Results  Component Value Date   HGBA1C 5.2 03/19/2018      Assessment & Plan:   Problem List Items Addressed This Visit       Cardiovascular and Mediastinum   Raynaud disease    Continue amlodipine for blood pressure control and Raynaud's.      Relevant Medications   amLODipine (NORVASC) 5 MG tablet   metoprolol succinate (TOPROL-XL) 25 MG 24 hr tablet   Essential hypertension - Primary    Repeat blood pressure was still little elevated.  Currently on metoprolol 25 mg and amlodipine 5 mg.  Consider discontinuing the metoprolol and switching to an ARB.      Relevant Medications   amLODipine (NORVASC) 5 MG tablet   metoprolol succinate (TOPROL-XL) 25 MG 24 hr tablet   Other Relevant Orders   BASIC METABOLIC PANEL WITH GFR   Direct LDL     Musculoskeletal and Integument   Osteoporosis    Continue Boniva and add in some resistance/weight training to her regimen.        Other   HYPERCHOLESTEROLEMIA    Bedside been trending up.  Currently on fish oil and atorvastatin 40 mg.  We will plan to recheck LDL today.  He is on pharmacy records it does not look like she is actually been filling the atorvastatin.      Relevant Medications   amLODipine (NORVASC) 5 MG tablet   metoprolol succinate (TOPROL-XL) 25 MG 24 hr tablet   Other Relevant Orders   BASIC METABOLIC PANEL WITH GFR   Direct LDL    Meds ordered this encounter  Medications   amLODipine (NORVASC) 5 MG tablet    Sig: Take 1 tablet (5 mg total) by mouth daily.    Dispense:  90 tablet    Refill:  1   metoprolol succinate (TOPROL-XL) 25 MG 24  hr tablet    Sig: Take 1 tablet (25 mg total) by mouth daily.    Dispense:  90 tablet    Refill:  1    Follow-up: Return in about 6 months (around 11/07/2021) for Hypertension.    Beatrice Lecher, MD

## 2021-05-10 NOTE — Assessment & Plan Note (Signed)
Continue amlodipine for blood pressure control and Raynaud's.

## 2021-05-11 LAB — BASIC METABOLIC PANEL WITH GFR
BUN: 15 mg/dL (ref 7–25)
CO2: 32 mmol/L (ref 20–32)
Calcium: 10.4 mg/dL (ref 8.6–10.4)
Chloride: 103 mmol/L (ref 98–110)
Creat: 0.99 mg/dL (ref 0.60–1.00)
Glucose, Bld: 98 mg/dL (ref 65–99)
Potassium: 4.6 mmol/L (ref 3.5–5.3)
Sodium: 141 mmol/L (ref 135–146)
eGFR: 61 mL/min/{1.73_m2} (ref >=60)

## 2021-05-11 LAB — LDL CHOLESTEROL, DIRECT: Direct LDL: 63 mg/dL (ref <100)

## 2021-05-11 MED ORDER — VALSARTAN 160 MG PO TABS
160.0000 mg | ORAL_TABLET | Freq: Every day | ORAL | 1 refills | Status: DC
Start: 2021-05-11 — End: 2021-11-04

## 2021-05-11 NOTE — Telephone Encounter (Signed)
Meds ordered this encounter  Medications   valsartan (DIOVAN) 160 MG tablet    Sig: Take 1 tablet (160 mg total) by mouth daily.    Dispense:  90 tablet    Refill:  1

## 2021-05-11 NOTE — Progress Notes (Signed)
Metabolic panel looks great.  I did repeat an LDL cholesterol this time since we did a panel last time.  And it is under 70 which is awesome that is our goal!

## 2021-05-12 NOTE — Telephone Encounter (Signed)
Patient advised.

## 2021-05-25 ENCOUNTER — Ambulatory Visit (INDEPENDENT_AMBULATORY_CARE_PROVIDER_SITE_OTHER): Payer: Medicare Other | Admitting: Family Medicine

## 2021-05-25 ENCOUNTER — Other Ambulatory Visit: Payer: Self-pay

## 2021-05-25 VITALS — BP 117/68 | HR 70 | Temp 97.6°F | Ht 63.0 in | Wt 151.1 lb

## 2021-05-25 DIAGNOSIS — Z79899 Other long term (current) drug therapy: Secondary | ICD-10-CM | POA: Diagnosis not present

## 2021-05-25 NOTE — Progress Notes (Signed)
Pretension-repeat blood pressure looks absolutely perfect.  Continue current regimen.  Check BMP since new start ARB.  Regularly scheduled follow-up.

## 2021-05-25 NOTE — Progress Notes (Signed)
Patient is here for a blood pressure check and BMP. Denies chest pains, SOB, palpitations, lightheadedness, blurry vision, headaches, sleep, mood or medication problems.   Patient blood pressure reading was not at goal. Patient sat for 10 minutes. Repeated blood pressure reading was at goal. Patient completed blood draw for BMP during nurse appointment. Patient advised to schedule an appointment as instructed by provider.

## 2021-05-26 LAB — BASIC METABOLIC PANEL WITH GFR
BUN/Creatinine Ratio: 20 (calc) (ref 6–22)
BUN: 20 mg/dL (ref 7–25)
CO2: 27 mmol/L (ref 20–32)
Calcium: 10.2 mg/dL (ref 8.6–10.4)
Chloride: 102 mmol/L (ref 98–110)
Creat: 1.01 mg/dL — ABNORMAL HIGH (ref 0.60–1.00)
Glucose, Bld: 94 mg/dL (ref 65–99)
Potassium: 4.9 mmol/L (ref 3.5–5.3)
Sodium: 140 mmol/L (ref 135–146)
eGFR: 59 mL/min/{1.73_m2} — ABNORMAL LOW (ref >=60)

## 2021-05-26 NOTE — Progress Notes (Signed)
Overall labs are okay.  Kidney function jumped up slightly but she looks more dry on the blood work.  We will plan to recheck again in 6 months.

## 2021-06-02 DIAGNOSIS — L720 Epidermal cyst: Secondary | ICD-10-CM | POA: Diagnosis not present

## 2021-06-02 DIAGNOSIS — L820 Inflamed seborrheic keratosis: Secondary | ICD-10-CM | POA: Diagnosis not present

## 2021-06-02 DIAGNOSIS — L821 Other seborrheic keratosis: Secondary | ICD-10-CM | POA: Diagnosis not present

## 2021-11-02 ENCOUNTER — Encounter: Payer: Self-pay | Admitting: Family Medicine

## 2021-11-02 ENCOUNTER — Ambulatory Visit (INDEPENDENT_AMBULATORY_CARE_PROVIDER_SITE_OTHER): Payer: Medicare Other | Admitting: Family Medicine

## 2021-11-02 ENCOUNTER — Other Ambulatory Visit: Payer: Medicare Other

## 2021-11-02 VITALS — BP 127/73 | HR 78 | Ht 62.0 in | Wt 158.0 lb

## 2021-11-02 DIAGNOSIS — R0789 Other chest pain: Secondary | ICD-10-CM | POA: Diagnosis not present

## 2021-11-02 DIAGNOSIS — R140 Abdominal distension (gaseous): Secondary | ICD-10-CM | POA: Diagnosis not present

## 2021-11-02 DIAGNOSIS — R142 Eructation: Secondary | ICD-10-CM | POA: Diagnosis not present

## 2021-11-02 DIAGNOSIS — R1084 Generalized abdominal pain: Secondary | ICD-10-CM

## 2021-11-02 DIAGNOSIS — R10821 Right upper quadrant rebound abdominal tenderness: Secondary | ICD-10-CM

## 2021-11-02 DIAGNOSIS — R9431 Abnormal electrocardiogram [ECG] [EKG]: Secondary | ICD-10-CM | POA: Diagnosis not present

## 2021-11-02 DIAGNOSIS — R1013 Epigastric pain: Secondary | ICD-10-CM

## 2021-11-02 NOTE — Progress Notes (Signed)
Pt reports that she has been experiencing GI issues for the past week. She stated that she has been bloated and has pain that is in her sternum and under both breasts and will radiate to her back. She will belch. Pressing on her abdomen causes pain. She stated that the pain will move to different areas in her abdomen. The pain is 8/10. She's not hurting at this time.   She has tried taking Tums, and Prilosec neither of these really helped.   Her BM's have been loose no diarrhea. Eating hurts and she does belch soon afterwards.   Pt stated that she has never had this happen to her before.    She had oatmeal this morning but wasn't able to finish, a plum around 2 PM, and last drank water at 230 PM.

## 2021-11-02 NOTE — Progress Notes (Signed)
Acute Office Visit  Subjective:     Patient ID: Maria Romero, female    DOB: September 11, 1950, 71 y.o.   MRN: [id number]  Chief Complaint  Patient presents with   Gastroesophageal Reflux    HPI Patient is in today for that she has been experiencing GI issues for the past week. She stated that she has been bloated.  She has had pain in the epigastric area it radiates bilaterally below both breasts with a burning sensation.  Sometimes it radiates a little towards the umbilicus and sometimes it radiates back towards her shoulder blades in the back.  She has been belching a lot for the last week as well.  No vomiting.. Pressing on her abdomen causes pain. She stated that the pain will move to different areas in her abdomen. The pain is 8/10. She's not hurting at this time.  But it can been painful when she turns over in bed it seems to be a little worse after she eats she just has no appetite.  No prior history of cholecystectomy.  She is also had some intermittent loose stools and normally she has more chronic constipation.   She has tried taking Tums, and Prilosec neither of these really helped.    Her BM's have been loose no diarrhea. Eating hurts and she does belch soon afterwards.    Pt stated that she has never had this happen to her before.      She had oatmeal this morning but wasn't able to finish, a plum around 2 PM, and last drank water at 230 PM.  ROS      Objective:    BP 127/73   Pulse 78   Ht '5\' 2"'$  (1.575 m)   Wt 158 lb (71.7 kg)   SpO2 95%   BMI 28.90 kg/m    Physical Exam Vitals and nursing note reviewed.  Constitutional:      Appearance: She is well-developed.  HENT:     Head: Normocephalic and atraumatic.  Eyes:     Conjunctiva/sclera: Conjunctivae normal.  Cardiovascular:     Rate and Rhythm: Normal rate and regular rhythm.     Heart sounds: Normal heart sounds.  Pulmonary:     Effort: Pulmonary effort is normal.     Breath sounds: Normal breath  sounds.  Abdominal:     General: Bowel sounds are normal. There is distension.     Palpations: Abdomen is soft.     Tenderness: There is abdominal tenderness. There is no guarding or rebound.     Comments: Tender in the right upper quadrant.  Skin:    General: Skin is warm and dry.  Neurological:     Mental Status: She is alert and oriented to person, place, and time.  Psychiatric:        Behavior: Behavior normal.     No results found for any visits on 11/02/21.      Assessment & Plan:   Problem List Items Addressed This Visit   None Visit Diagnoses     Atypical chest pain    -  Primary   Relevant Orders   EKG 12-Lead   US Abdomen Complete   COMPLETE METABOLIC PANEL WITH GFR   CBC with Differential/Platelet   Lipase   Epigastric pain       Relevant Orders   US Abdomen Complete   COMPLETE METABOLIC PANEL WITH GFR   CBC with Differential/Platelet   Lipase   Right upper quadrant abdominal tenderness with  rebound tenderness       Relevant Orders   US Abdomen Complete   COMPLETE METABOLIC PANEL WITH GFR   CBC with Differential/Platelet   Lipase   Bloating       Relevant Orders   US Abdomen Complete   COMPLETE METABOLIC PANEL WITH GFR   CBC with Differential/Platelet   Lipase   Belching       Relevant Orders   US Abdomen Complete   COMPLETE METABOLIC PANEL WITH GFR   CBC with Differential/Platelet   Lipase   Abnormal EKG       Relevant Orders   ECHOCARDIOGRAM COMPLETE      Epigastric pain radiating bilaterally with tenderness in the right upper quadrant-discussed potential differential including cholecystitis.  We will get her scheduled for ultrasound first thing in the morning as well as check for pancreatitis and hepatitis.  We will check a CBC as well.  In the meantime recommend bland diet avoiding anything greasy spicy or oily or high in fat.  Okay to continue PPI.  If continues to get worse overnight then please go to the emergency department for further  work-up.  Atypical chest pain-EKG today shows rate of 79 bpm, normal sinus rhythm with no acute changes though she does have some low voltage QRS that was not present compared to prior EKG performed in 2011.  No orders of the defined types were placed in this encounter.   Return if symptoms worsen or fail to improve.  Beatrice Lecher, MD

## 2021-11-03 ENCOUNTER — Other Ambulatory Visit: Payer: Self-pay | Admitting: *Deleted

## 2021-11-03 ENCOUNTER — Ambulatory Visit (INDEPENDENT_AMBULATORY_CARE_PROVIDER_SITE_OTHER): Payer: Medicare Other

## 2021-11-03 DIAGNOSIS — R140 Abdominal distension (gaseous): Secondary | ICD-10-CM

## 2021-11-03 DIAGNOSIS — R10821 Right upper quadrant rebound abdominal tenderness: Secondary | ICD-10-CM | POA: Diagnosis not present

## 2021-11-03 DIAGNOSIS — N1330 Unspecified hydronephrosis: Secondary | ICD-10-CM | POA: Diagnosis not present

## 2021-11-03 DIAGNOSIS — R932 Abnormal findings on diagnostic imaging of liver and biliary tract: Secondary | ICD-10-CM

## 2021-11-03 DIAGNOSIS — R0789 Other chest pain: Secondary | ICD-10-CM

## 2021-11-03 DIAGNOSIS — R1013 Epigastric pain: Secondary | ICD-10-CM

## 2021-11-03 DIAGNOSIS — R188 Other ascites: Secondary | ICD-10-CM

## 2021-11-03 DIAGNOSIS — R142 Eructation: Secondary | ICD-10-CM

## 2021-11-03 DIAGNOSIS — R9431 Abnormal electrocardiogram [ECG] [EKG]: Secondary | ICD-10-CM

## 2021-11-03 NOTE — Progress Notes (Signed)
Hi Maria Romero-there are some concerning findings with your liver that could indicate possible cirrhosis.  They did not see anything concerning with your gallbladder.  You do have excess fluid in the abdomen called ascites.  We need to get a further work-up with an MRI with and without contrast please you are also able to get your labs drawn this morning so that I can see what your liver enzymes are doing.  If you are okay with moving forward with the MRI please let me know.

## 2021-11-04 ENCOUNTER — Other Ambulatory Visit: Payer: Self-pay | Admitting: Family Medicine

## 2021-11-04 DIAGNOSIS — I10 Essential (primary) hypertension: Secondary | ICD-10-CM

## 2021-11-04 LAB — COMPLETE METABOLIC PANEL WITH GFR
AG Ratio: 2 (calc) (ref 1.0–2.5)
ALT: 19 U/L (ref 6–29)
AST: 24 U/L (ref 10–35)
Albumin: 4 g/dL (ref 3.6–5.1)
Alkaline phosphatase (APISO): 67 U/L (ref 37–153)
BUN: 18 mg/dL (ref 7–25)
CO2: 23 mmol/L (ref 20–32)
Calcium: 9.2 mg/dL (ref 8.6–10.4)
Chloride: 107 mmol/L (ref 98–110)
Creat: 0.93 mg/dL (ref 0.60–1.00)
Globulin: 2 g/dL (calc) (ref 1.9–3.7)
Glucose, Bld: 88 mg/dL (ref 65–99)
Potassium: 4.4 mmol/L (ref 3.5–5.3)
Sodium: 140 mmol/L (ref 135–146)
Total Bilirubin: 0.6 mg/dL (ref 0.2–1.2)
Total Protein: 6 g/dL — ABNORMAL LOW (ref 6.1–8.1)
eGFR: 65 mL/min/{1.73_m2} (ref >=60)

## 2021-11-04 LAB — CBC WITH DIFFERENTIAL/PLATELET
Absolute Monocytes: 562 cells/uL (ref 200–950)
Basophils Absolute: 31 cells/uL (ref 0–200)
Basophils Relative: 0.4 %
Eosinophils Absolute: 69 cells/uL (ref 15–500)
Eosinophils Relative: 0.9 %
HCT: 40.2 % (ref 35.0–45.0)
Hemoglobin: 13.7 g/dL (ref 11.7–15.5)
Lymphs Abs: 1540 cells/uL (ref 850–3900)
MCH: 31.7 pg (ref 27.0–33.0)
MCHC: 34.1 g/dL (ref 32.0–36.0)
MCV: 93.1 fL (ref 80.0–100.0)
MPV: 11.5 fL (ref 7.5–12.5)
Monocytes Relative: 7.3 %
Neutro Abs: 5498 cells/uL (ref 1500–7800)
Neutrophils Relative %: 71.4 %
Platelets: 325 10*3/uL (ref 140–400)
RBC: 4.32 10*6/uL (ref 3.80–5.10)
RDW: 12.2 % (ref 11.0–15.0)
Total Lymphocyte: 20 %
WBC: 7.7 10*3/uL (ref 3.8–10.8)

## 2021-11-04 LAB — LIPASE: Lipase: 24 U/L (ref 7–60)

## 2021-11-05 NOTE — Progress Notes (Signed)
Hi Bobbe, kidney function is stable.  Liver enzymes are normal.  Protein level was a little borderline low so I do want to keep an eye on that and plan to recheck again in 3 months.  Your blood count was normal no sign of infection.  No sign of pancreatitis.  I ordered the MRI of your liver so just let me know if you have not heard back from them.  May take a day or 2 to get it approved.  So I am hoping that we can get the scan done early next week.  Can let me know if you have not heard from them by today.

## 2021-11-08 ENCOUNTER — Ambulatory Visit: Payer: Medicare Other | Admitting: Family Medicine

## 2021-11-08 ENCOUNTER — Ambulatory Visit (INDEPENDENT_AMBULATORY_CARE_PROVIDER_SITE_OTHER): Payer: Medicare Other

## 2021-11-08 DIAGNOSIS — R188 Other ascites: Secondary | ICD-10-CM

## 2021-11-08 DIAGNOSIS — R932 Abnormal findings on diagnostic imaging of liver and biliary tract: Secondary | ICD-10-CM

## 2021-11-08 DIAGNOSIS — R109 Unspecified abdominal pain: Secondary | ICD-10-CM | POA: Insufficient documentation

## 2021-11-08 DIAGNOSIS — K449 Diaphragmatic hernia without obstruction or gangrene: Secondary | ICD-10-CM | POA: Diagnosis not present

## 2021-11-08 MED ORDER — GADOBUTROL 1 MMOL/ML IV SOLN
7.1000 mL | Freq: Once | INTRAVENOUS | Status: AC | PRN
  Administered 2021-11-08: 7.1 mL via INTRAVENOUS

## 2021-11-08 NOTE — Progress Notes (Signed)
    Procedures performed today:    None.  Independent interpretation of notes and tests performed by another provider:   None.  Brief History, Exam, Impression, and Recommendations:    Abdominal pain Please see prior notes from Dr. Madilyn Fireman, this is an update 11/08/2021 based on MRI liver: Several weeks of vague abdominal pain, bloating, fullness, ultrasound showed potential nodularity of the liver concerning for cirrhosis, ultimately MRI of the liver was concerning for peritoneal carcinomatosis with recommendations for CT abdomen and pelvis with oral and IV contrast for further delineation, all worrisome for a potential pelvic malignancy.  Attempted to call patient multiple times, ended up leaving a message twice on her cell phone, no voicemail set up for the home phone, I did let her know on the second voicemail that we were dealing with a concern for peritoneal carcinomatosis and what this meant, and that we would need a CT abdomen and pelvis with oral and IV contrast.  Orders placed to go ahead and get the ball rolling on the diagnostic process.  Also asked her to call me back or send me a MyChart message so we can discuss this in further detail.    ____________________________________________ Gwen Her. Dianah Field, M.D., ABFM., CAQSM., AME. Primary Care and Sports Medicine [facility]  Adjunct Professor of [employer]  Risk manager

## 2021-11-08 NOTE — Assessment & Plan Note (Addendum)
Please see prior notes from Dr. Madilyn Fireman, this is an update 11/08/2021 based on MRI liver: Several weeks of vague abdominal pain, bloating, fullness, ultrasound showed potential nodularity of the liver concerning for cirrhosis, ultimately MRI of the liver was concerning for peritoneal carcinomatosis with recommendations for CT abdomen and pelvis with oral and IV contrast for further delineation, all worrisome for a potential pelvic malignancy.  Attempted to call patient multiple times, ended up leaving a message twice on her cell phone, no voicemail set up for the home phone, I did let her know on the second voicemail that we were dealing with a concern for peritoneal carcinomatosis and what this meant, and that we would need a CT abdomen and pelvis with oral and IV contrast.  Orders placed to go ahead and get the ball rolling on the diagnostic process.  Also asked her to call me back or send me a MyChart message so we can discuss this in further detail.

## 2021-11-08 NOTE — Addendum Note (Signed)
Addended by: Silverio Decamp on: 11/08/2021 12:14 PM   Modules accepted: Orders

## 2021-11-15 ENCOUNTER — Ambulatory Visit (INDEPENDENT_AMBULATORY_CARE_PROVIDER_SITE_OTHER): Payer: Medicare Other | Admitting: Family Medicine

## 2021-11-15 ENCOUNTER — Encounter: Payer: Self-pay | Admitting: Family Medicine

## 2021-11-15 ENCOUNTER — Ambulatory Visit (INDEPENDENT_AMBULATORY_CARE_PROVIDER_SITE_OTHER): Payer: Medicare Other

## 2021-11-15 VITALS — BP 115/67 | HR 98 | Ht 62.0 in | Wt 155.0 lb

## 2021-11-15 DIAGNOSIS — R859 Unspecified abnormal finding in specimens from digestive organs and abdominal cavity: Secondary | ICD-10-CM | POA: Diagnosis not present

## 2021-11-15 DIAGNOSIS — R1013 Epigastric pain: Secondary | ICD-10-CM | POA: Diagnosis not present

## 2021-11-15 DIAGNOSIS — N281 Cyst of kidney, acquired: Secondary | ICD-10-CM | POA: Diagnosis not present

## 2021-11-15 DIAGNOSIS — I700 Atherosclerosis of aorta: Secondary | ICD-10-CM | POA: Diagnosis not present

## 2021-11-15 DIAGNOSIS — R1084 Generalized abdominal pain: Secondary | ICD-10-CM | POA: Diagnosis not present

## 2021-11-15 DIAGNOSIS — R935 Abnormal findings on diagnostic imaging of other abdominal regions, including retroperitoneum: Secondary | ICD-10-CM | POA: Diagnosis not present

## 2021-11-15 DIAGNOSIS — I10 Essential (primary) hypertension: Secondary | ICD-10-CM | POA: Diagnosis not present

## 2021-11-15 DIAGNOSIS — R188 Other ascites: Secondary | ICD-10-CM | POA: Diagnosis not present

## 2021-11-15 MED ORDER — IOHEXOL 300 MG/ML  SOLN
100.0000 mL | Freq: Once | INTRAMUSCULAR | Status: AC | PRN
  Administered 2021-11-15: 100 mL via INTRAVENOUS

## 2021-11-15 NOTE — Assessment & Plan Note (Signed)
Blood pressure at goal

## 2021-11-15 NOTE — Progress Notes (Signed)
Established Patient Office Visit  Subjective   Patient ID: Maria Romero, female    DOB: 1950-12-20  Age: 71 y.o. MRN: [id number]  Chief Complaint  Patient presents with   Follow-up    Pt c/o sob onset for 1 week  and 6 month fup on htn    HPI   Here for follow-up shortness of breath and abnormal MRI scan.  She is scheduled for further workup including CT of abdomen and pelvis at 1:00 today and she has already picked up her contrast to drink.  It looks like she has possibly some peritoneal nodularity that could be consistent with carcinomatosis.  So we are evaluating for any pelvic pathology.  She would prefer to stay in [name] for any additional care that she might require.  She is still feeling quite short of breath especially with activity she still getting a burning sensation across the upper abdomen and a little bit of discomfort radiating towards the umbilicus.  She feels a lot of fullness particularly on the right side of the abdomen.  She is also noticed over this last week she says started to have more loose bowel movements particularly in the mornings that are more loose and frequent and some mucus.  She has not noticed any blood. Her daughter [name] is here with her today.  She also wanted to let me know that she is going to stop her Boniva.  Follow-up hypertension-taking Diovan and amlodipine regularly.    Still has significantly decreased appetite.  Weight is down 3 pounds.     ROS    Objective:     BP 115/67   Pulse 98   Ht '5\' 2"'$  (1.575 m)   Wt 155 lb (70.3 kg)   SpO2 99%   BMI 28.35 kg/m    Physical Exam Vitals and nursing note reviewed.  Constitutional:      Appearance: She is well-developed.  HENT:     Head: Normocephalic and atraumatic.  Pulmonary:     Comments: Inc work of breathing with talking Skin:    General: Skin is warm and dry.  Neurological:     Mental Status: She is alert and oriented to person, place, and time.  Psychiatric:         Behavior: Behavior normal.      No results found for any visits on 11/15/21.    The 10-year ASCVD risk score (Arnett DK, et al., 2019) is: 12.1%    Assessment & Plan:   Problem List Items Addressed This Visit       Cardiovascular and Mediastinum   Essential hypertension    Blood pressure at goal.      Other Visit Diagnoses     Peritoneal fluid abnormality    -  Primary   Relevant Orders   Ambulatory referral to Hematology / Oncology   Ambulatory referral to Gastroenterology   Abnormal MRI of abdomen       Relevant Orders   Ambulatory referral to Gastroenterology      Abnormal MRI showing possible peritoneal carcinomatosis-discussed results with her she does have her scan scheduled for this afternoon so we will have more information.  Will likely end up referring to hematology oncology and she would prefer to stay in [name] so we will plan to refer to [name].  Will call with results once available.  Ascites-this causing significant discomfort as well as decreased appetite and shortness of breath-will refer to GI for further evaluation.  She also had some  nodularity to the liver so still question possible underlying cirrhosis.  Return if symptoms worsen or fail to improve.    Beatrice Lecher, MD

## 2021-11-16 ENCOUNTER — Telehealth: Payer: Self-pay

## 2021-11-16 NOTE — Telephone Encounter (Signed)
Called pt in regards to her concern and she advised to cancel appt for digestive health, pt was also advised to keep her appt  with cardiology and oncology, pt acknowledged and understood.

## 2021-11-16 NOTE — Telephone Encounter (Signed)
Pt called with additional questions about lab results and digestive health appointment pt is unsure if she still needs the appointment

## 2021-11-17 ENCOUNTER — Encounter: Payer: Self-pay | Admitting: *Deleted

## 2021-11-17 NOTE — Progress Notes (Signed)
Reached out to Anderson Malta to introduce myself as the office RN Navigator and explain our new patient process. Reviewed the reason for their referral and scheduled their new patient appointment along with labs. Provided address and directions to the office including call back phone number. Reviewed with patient any concerns they may have or any possible barriers to attending their appointment.   Informed patient about my role as a navigator and that I will meet with them prior to their New Patient appointment and more fully discuss what services I can provide. At this time patient has no further questions or needs.    Oncology Nurse Navigator Documentation     11/17/2021    9:15 AM  Oncology Nurse Navigator Flowsheets  Abnormal Finding Date 11/03/2021  Diagnosis Status Additional Work Up  Navigator Follow Up Date: 11/19/2021  Navigator Follow Up Reason: New Patient Appointment  Navigator Location [facility]  Referral Date to RadOnc/MedOnc 11/17/2021  Navigator Encounter Type Introductory Phone Call  Patient Visit Type MedOnc  Treatment Phase Abnormal Scans  Barriers/Navigation Needs Coordination of Care;Education  Education Other  Interventions Coordination of Care;Education;Psycho-Social Support  Acuity Level 2-Minimal Needs (1-2 Barriers Identified)  Coordination of Care Appts  Education Method Verbal;Teach-back  Time Spent with Patient 30

## 2021-11-19 ENCOUNTER — Other Ambulatory Visit: Payer: Self-pay

## 2021-11-19 ENCOUNTER — Encounter: Payer: Self-pay | Admitting: *Deleted

## 2021-11-19 ENCOUNTER — Inpatient Hospital Stay: Payer: Medicare Other | Attending: Hematology & Oncology

## 2021-11-19 ENCOUNTER — Encounter: Payer: Self-pay | Admitting: Hematology & Oncology

## 2021-11-19 ENCOUNTER — Inpatient Hospital Stay (HOSPITAL_BASED_OUTPATIENT_CLINIC_OR_DEPARTMENT_OTHER): Payer: Medicare Other | Admitting: Hematology & Oncology

## 2021-11-19 VITALS — BP 107/73 | HR 90 | Temp 98.0°F | Resp 19 | Ht 62.0 in | Wt 155.1 lb

## 2021-11-19 DIAGNOSIS — N838 Other noninflammatory disorders of ovary, fallopian tube and broad ligament: Secondary | ICD-10-CM

## 2021-11-19 DIAGNOSIS — Z87891 Personal history of nicotine dependence: Secondary | ICD-10-CM | POA: Diagnosis not present

## 2021-11-19 DIAGNOSIS — R6881 Early satiety: Secondary | ICD-10-CM | POA: Insufficient documentation

## 2021-11-19 DIAGNOSIS — K669 Disorder of peritoneum, unspecified: Secondary | ICD-10-CM | POA: Insufficient documentation

## 2021-11-19 DIAGNOSIS — R109 Unspecified abdominal pain: Secondary | ICD-10-CM | POA: Diagnosis not present

## 2021-11-19 DIAGNOSIS — K580 Irritable bowel syndrome with diarrhea: Secondary | ICD-10-CM | POA: Insufficient documentation

## 2021-11-19 DIAGNOSIS — M810 Age-related osteoporosis without current pathological fracture: Secondary | ICD-10-CM | POA: Diagnosis not present

## 2021-11-19 DIAGNOSIS — R978 Other abnormal tumor markers: Secondary | ICD-10-CM | POA: Diagnosis not present

## 2021-11-19 DIAGNOSIS — R180 Malignant ascites: Secondary | ICD-10-CM

## 2021-11-19 DIAGNOSIS — R630 Anorexia: Secondary | ICD-10-CM | POA: Insufficient documentation

## 2021-11-19 DIAGNOSIS — D398 Neoplasm of uncertain behavior of other specified female genital organs: Secondary | ICD-10-CM | POA: Insufficient documentation

## 2021-11-19 DIAGNOSIS — R971 Elevated cancer antigen 125 [CA 125]: Secondary | ICD-10-CM | POA: Diagnosis not present

## 2021-11-19 DIAGNOSIS — I10 Essential (primary) hypertension: Secondary | ICD-10-CM | POA: Insufficient documentation

## 2021-11-19 DIAGNOSIS — R140 Abdominal distension (gaseous): Secondary | ICD-10-CM | POA: Diagnosis not present

## 2021-11-19 DIAGNOSIS — Z7982 Long term (current) use of aspirin: Secondary | ICD-10-CM | POA: Diagnosis not present

## 2021-11-19 DIAGNOSIS — E7800 Pure hypercholesterolemia, unspecified: Secondary | ICD-10-CM | POA: Diagnosis not present

## 2021-11-19 DIAGNOSIS — J90 Pleural effusion, not elsewhere classified: Secondary | ICD-10-CM

## 2021-11-19 DIAGNOSIS — R188 Other ascites: Secondary | ICD-10-CM

## 2021-11-19 DIAGNOSIS — Z803 Family history of malignant neoplasm of breast: Secondary | ICD-10-CM | POA: Insufficient documentation

## 2021-11-19 DIAGNOSIS — Z5111 Encounter for antineoplastic chemotherapy: Secondary | ICD-10-CM | POA: Diagnosis not present

## 2021-11-19 DIAGNOSIS — C786 Secondary malignant neoplasm of retroperitoneum and peritoneum: Secondary | ICD-10-CM | POA: Diagnosis not present

## 2021-11-19 DIAGNOSIS — Z79899 Other long term (current) drug therapy: Secondary | ICD-10-CM | POA: Diagnosis not present

## 2021-11-19 DIAGNOSIS — C561 Malignant neoplasm of right ovary: Secondary | ICD-10-CM | POA: Diagnosis present

## 2021-11-19 DIAGNOSIS — R1030 Lower abdominal pain, unspecified: Secondary | ICD-10-CM

## 2021-11-19 DIAGNOSIS — C801 Malignant (primary) neoplasm, unspecified: Secondary | ICD-10-CM | POA: Insufficient documentation

## 2021-11-19 HISTORY — DX: Other noninflammatory disorders of ovary, fallopian tube and broad ligament: N83.8

## 2021-11-19 HISTORY — DX: Malignant ascites: R18.0

## 2021-11-19 LAB — CBC WITH DIFFERENTIAL (CANCER CENTER ONLY)
Abs Immature Granulocytes: 0.03 10*3/uL (ref 0.00–0.07)
Basophils Absolute: 0.1 10*3/uL (ref 0.0–0.1)
Basophils Relative: 1 %
Eosinophils Absolute: 0.1 10*3/uL (ref 0.0–0.5)
Eosinophils Relative: 0 %
HCT: 41.6 % (ref 36.0–46.0)
Hemoglobin: 13.5 g/dL (ref 12.0–15.0)
Immature Granulocytes: 0 %
Lymphocytes Relative: 12 %
Lymphs Abs: 1.3 10*3/uL (ref 0.7–4.0)
MCH: 30.1 pg (ref 26.0–34.0)
MCHC: 32.5 g/dL (ref 30.0–36.0)
MCV: 92.9 fL (ref 80.0–100.0)
Monocytes Absolute: 0.9 10*3/uL (ref 0.1–1.0)
Monocytes Relative: 8 %
Neutro Abs: 9.2 10*3/uL — ABNORMAL HIGH (ref 1.7–7.7)
Neutrophils Relative %: 79 %
Platelet Count: 419 10*3/uL — ABNORMAL HIGH (ref 150–400)
RBC: 4.48 MIL/uL (ref 3.87–5.11)
RDW: 12.9 % (ref 11.5–15.5)
WBC Count: 11.5 10*3/uL — ABNORMAL HIGH (ref 4.0–10.5)
nRBC: 0 % (ref 0.0–0.2)

## 2021-11-19 LAB — CMP (CANCER CENTER ONLY)
ALT: 13 U/L (ref 0–44)
AST: 18 U/L (ref 15–41)
Albumin: 3.9 g/dL (ref 3.5–5.0)
Alkaline Phosphatase: 80 U/L (ref 38–126)
Anion gap: 13 (ref 5–15)
BUN: 40 mg/dL — ABNORMAL HIGH (ref 8–23)
CO2: 19 mmol/L — ABNORMAL LOW (ref 22–32)
Calcium: 9.6 mg/dL (ref 8.9–10.3)
Chloride: 102 mmol/L (ref 98–111)
Creatinine: 1.81 mg/dL — ABNORMAL HIGH (ref 0.44–1.00)
GFR, Estimated: 29 mL/min — ABNORMAL LOW (ref >60)
Glucose, Bld: 119 mg/dL — ABNORMAL HIGH (ref 70–99)
Potassium: 4.5 mmol/L (ref 3.5–5.1)
Sodium: 134 mmol/L — ABNORMAL LOW (ref 135–145)
Total Bilirubin: 0.5 mg/dL (ref 0.3–1.2)
Total Protein: 6.5 g/dL (ref 6.5–8.1)

## 2021-11-19 LAB — PREALBUMIN: Prealbumin: 12 mg/dL — ABNORMAL LOW (ref 18–38)

## 2021-11-19 LAB — LACTATE DEHYDROGENASE: LDH: 667 U/L — ABNORMAL HIGH (ref 98–192)

## 2021-11-19 LAB — CEA (IN HOUSE-CHCC): CEA (CHCC-In House): 1.61 ng/mL (ref 0.00–5.00)

## 2021-11-19 NOTE — Progress Notes (Signed)
Initial RN Navigator Patient Visit  Name: LAKYNN HALVORSEN Date of Referral : 11/17/2021 Diagnosis: Ovarian Mass  Met with patient prior to their visit with MD. Hanley Seamen patient "Your Patient Navigator" handout which explains my role, areas in which I am able to help, and all the contact information for myself and the office. Also gave patient MD and Navigator business card. Reviewed with patient the general overview of expected course after initial diagnosis and time frame for all steps to be completed.  New patient packet given to patient which includes: orientation to office and staff; campus directory; education on My Chart and Advance Directives; and patient centered education on ovarian cancer.   Patient comes with her daughter. She lives alone. Doesn't work. Her daughter is supportive as well as a son in law and cousin. She wants her daughter at every appointment. She works as a Pharmacist, hospital.   Patient completed visit with Dr. Marin Olp.  Due to the new patient appointment being Friday at 4p, explained to the patient that we will get started on her plan Monday morning. She understood.   Will follow up Monday.   Patient understands all follow up procedures and expectations. They have my number to reach out for any further clarification or additional needs.   Oncology Nurse Navigator Documentation     11/19/2021    4:00 PM  Oncology Nurse Navigator Flowsheets  Navigator Follow Up Date: 11/22/2021  Navigator Follow Up Reason: Appointment Review  Navigator Location [facility]  Navigator Encounter Type Initial MedOnc  Patient Visit Type MedOnc  Treatment Phase Abnormal Scans  Barriers/Navigation Needs Coordination of Care;Education  Education Newly Diagnosed Cancer Education;Pain/ Symptom Management;Other  Interventions Education;Psycho-Social Support  Acuity Level 2-Minimal Needs (1-2 Barriers Identified)  Education Method Verbal;Written  Support Groups/Services Friends and Family   Time Spent with Patient 64

## 2021-11-19 NOTE — Progress Notes (Signed)
Referral MD  Reason for Referral: Ascites and omental caking --likely ovarian cancer  Chief Complaint  Patient presents with   New Patient (Initial Visit)  : I have a lot of fluid on my abdomen.  HPI: Maria Romero is a very charming 71 year old white female.  She comes in with her daughter.  She is originally from [place].  She then lived in [place] for quite a while.  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  She moved to [place] in 2008.  Her daughter is a Pharmacist, hospital down in [place].  She has been very healthy.  She really has had very little in the way of health issues.  I think she does have some hypertension.  Recently, [place] the beach, she began to have abdominal discomfort.  She just felt full.  She gained some weight.  She had little bit of difficulty with emptying her bladder.  She had no problems with her stools.  She ultimately had a ultrasound done on 11/03/2021.  This showed moderate ascites.  She had slightly nodular contour of the liver.  She had mild left hydronephrosis.  She then had a CT scan done.  This was done on 11/15/2021.  This showed large volume ascites.  She had a right adnexal mass measuring 9.6 x 8.8 x 9.4 cm.  She had omental thickening.  She had small left and right pleural effusions.  She had peritoneal thickening.  This all appeared to be consistent with ovarian cancer.  She was currently referred to the [facility] for an evaluation.  She has had no history of smoking although she did smoke back when she was young.  She stopped when she was in her 24s.  She probably smoked for about 20 years.  There is a small history of cancer in the family.  There is no history of ovarian cancer in the family.  I think there may be a cousin who had breast cancer.  She has had no issues with respect to mammograms.  She is up-to-date with her colonoscopy.  She has not had a pelvic exam for many  years.  There is been no bleeding.  She has had no cough.  She just feels tired.  She has fatigue.  She has had no leg swelling.  There is been no rashes.  Overall, I would say performance status is probably ECOG 1.   Past Medical History:  Diagnosis Date   Hypercholesterolemia    IBS (irritable bowel syndrome)    Osteopenia    Osteoporosis   :   Past Surgical History:  Procedure Laterality Date   lump removal  1995   TONSILLECTOMY     TUBAL LIGATION  1990  :   Current Outpatient Medications:    amLODipine (NORVASC) 5 MG tablet, TAKE ONE TABLET BY MOUTH DAILY, Disp: 90 tablet, Rfl: 1   aspirin 81 MG EC tablet, Take 81 mg by mouth daily., Disp: , Rfl:    atorvastatin (LIPITOR) 40 MG tablet, Take 1 tablet (40 mg total) by mouth at bedtime., Disp: 90 tablet, Rfl: 3   Calcium Carb-Cholecalciferol (CALCIUM 1000 + D PO), Take 1 tablet by mouth daily., Disp: , Rfl:    cholecalciferol (VITAMIN D3) 25 MCG (1000 UNIT) tablet, Take 1,000 Units by mouth daily., Disp: , Rfl:    Omega-3 Fatty Acids (FISH OIL MAXIMUM STRENGTH) 1200 MG CAPS, Take 1 capsule by mouth 2 (two)  times daily., Disp: , Rfl:    valsartan (DIOVAN) 160 MG tablet, TAKE ONE TABLET BY MOUTH DAILY, Disp: 90 tablet, Rfl: 1:  :   Allergies  Allergen Reactions   Fluvastatin Sodium Other (See Comments)    REACTION: Myalgias  :   Family History  Problem Relation Age of Onset   Stroke Mother    Hypertension Mother    Hyperlipidemia Mother    Glaucoma Mother    Heart attack Father 71   Diabetes Father    Hypertension Father    Hyperlipidemia Father    Cataracts Father    Breast cancer Cousin   :   Social History   Socioeconomic History   Marital status: Widowed    Spouse name: Not on file   Number of children: 1   Years of education: 14   Highest education level: Associate degree: occupational, Hotel manager, or vocational program  Occupational History   Occupation: retired Quarry manager  Tobacco Use    Smoking status: Former    Types: Cigarettes    Quit date: 03/20/1990    Years since quitting: 31.6   Smokeless tobacco: Never   Tobacco comments:    quit 1988  Vaping Use   Vaping Use: Never used  Substance and Sexual Activity   Alcohol use: Yes    Alcohol/week: 2.0 standard drinks of alcohol    Types: 2 Glasses of wine per week    Comment: Wine in the evenings   Drug use: No   Sexual activity: Never    Partners: Male  Other Topics Concern   Not on file  Social History Narrative   Lives alone with her dog. Helps with her grandson pick up and volunteering at his school. She enjoys spending time with her family.   Social Determinants of Health   Financial Resource Strain: Low Risk  (03/26/2021)   Overall Financial Resource Strain (CARDIA)    Difficulty of Paying Living Expenses: Not hard at all  Food Insecurity: No Food Insecurity (03/26/2021)   Hunger Vital Sign    Worried About Running Out of Food in the Last Year: Never true    Ran Out of Food in the Last Year: Never true  Transportation Needs: No Transportation Needs (03/26/2021)   PRAPARE - Hydrologist (Medical): No    Lack of Transportation (Non-Medical): No  Physical Activity: Inactive (03/26/2021)   Exercise Vital Sign    Days of Exercise per Week: 0 days    Minutes of Exercise per Session: 0 min  Stress: No Stress Concern Present (03/26/2021)   [place]    Feeling of Stress : Not at all  Social Connections: Socially Isolated (03/26/2021)   Social Connection and Isolation Panel [NHANES]    Frequency of Communication with Friends and Family: More than three times a week    Frequency of Social Gatherings with Friends and Family: Three times a week    Attends Religious Services: Never    Active Member of Clubs or Organizations: No    Attends Archivist Meetings: Never    Marital Status: Widowed  Intimate  Partner Violence: Unknown (03/26/2021)   Humiliation, Afraid, Rape, and Kick questionnaire    Fear of Current or Ex-Partner: No    Emotionally Abused: No    Physically Abused: No    Sexually Abused: Not on file  :  Review of Systems  Constitutional:  Positive for malaise/fatigue.  HENT: Negative.  Eyes: Negative.   Respiratory: Negative.    Cardiovascular: Negative.   Gastrointestinal:  Positive for abdominal pain.  Genitourinary:  Positive for flank pain and frequency.  Skin: Negative.   Neurological: Negative.   Endo/Heme/Allergies: Negative.   Psychiatric/Behavioral: Negative.       Exam: Her vital signs show temperature of 98.  Pulse 90.  Blood pressure 107/73.  Weight is 155 pounds. '@IPVITALS'$ @ Physical Exam Vitals reviewed.  HENT:     Head: Normocephalic and atraumatic.  Eyes:     Pupils: Pupils are equal, round, and reactive to light.  Cardiovascular:     Rate and Rhythm: Normal rate and regular rhythm.     Heart sounds: Normal heart sounds.  Pulmonary:     Effort: Pulmonary effort is normal.     Breath sounds: Normal breath sounds.  Abdominal:     General: Bowel sounds are normal.     Palpations: Abdomen is soft.     Comments: Abdominal exam is distended.  Her abdomen is somewhat tight.  There is clearly a fluid wave.  There is no obvious abdominal mass.  There is no guarding or rebound tenderness.  There is no obvious hepatosplenomegaly.  Musculoskeletal:        General: No tenderness or deformity. Normal range of motion.     Cervical back: Normal range of motion.  Lymphadenopathy:     Cervical: No cervical adenopathy.  Skin:    General: Skin is warm and dry.     Findings: No erythema or rash.  Neurological:     Mental Status: She is alert and oriented to person, place, and time.  Psychiatric:        Behavior: Behavior normal.        Thought Content: Thought content normal.        Judgment: Judgment normal.     Recent Labs    11/19/21 1510  WBC  11.5*  HGB 13.5  HCT 41.6  PLT 419*    Recent Labs    11/19/21 1510  NA 134*  K 4.5  CL 102  CO2 19*  GLUCOSE 119*  BUN 40*  CREATININE 1.81*  CALCIUM 9.6    Blood smear review: None  Pathology: None    Assessment and Plan: Maria Romero is a very charming 71 year old white female.  She has ascites and omental caking and a right adnexal mass.  As such, I have to believe that this is going to be ovarian cancer.  We will be incredibly interesting to see what her CA-125 is.  Reviewed have to do a paracentesis on her.  We will have to try to get this set up quickly.  I think she needs to have this done Monday or Tuesday at the latest.  She also needs to have an omental biopsy.  It looks like there is is a area in the upper left abdomen that would be suitable for biopsy.  We will also need to have a CT of the chest.  I realize that her renal function is down a little bit.  We will have to get this without contrast.  She will also need to be seen by Gynecologic Oncology.  I really think they need to see her before we consider any type of neoadjuvant therapy.  I have to believe that she would be a candidate for neoadjuvant chemotherapy.  I would favor carboplatinum/Taxol.  There might be some controversy as whether or not Avastin should be given in the neoadjuvant setting.  She will also need to have a Port-A-Cath placed.  I will do this in anticipation of chemotherapy.  She is scheduled for an echocardiogram I think next week.  She is in great shape.  I do not see a reason why we cannot be aggressive with her.  By her scans, it looks that this is stage III disease.  Again we will really know the stage when she has surgery.  I would like to hope that she will have a good response to neoadjuvant chemotherapy and then have optimal debulking.  Again, we really need to get Surgical Oncology involved quickly.  I sent a message to Dr. Berline Lopes and hopefully she will be able to get her in  soon.  We will plan to get her back here once we have the are paracentesis/biopsy.  We will need to send the biopsy off for molecular analysis.

## 2021-11-20 LAB — CA 125: Cancer Antigen (CA) 125: 2481 U/mL — ABNORMAL HIGH (ref 0.0–38.1)

## 2021-11-20 LAB — CANCER ANTIGEN 19-9: CA 19-9: 43 U/mL — ABNORMAL HIGH (ref 0–35)

## 2021-11-22 ENCOUNTER — Encounter: Payer: Self-pay | Admitting: *Deleted

## 2021-11-22 ENCOUNTER — Telehealth: Payer: Self-pay | Admitting: *Deleted

## 2021-11-22 DIAGNOSIS — R188 Other ascites: Secondary | ICD-10-CM | POA: Diagnosis not present

## 2021-11-22 DIAGNOSIS — Z452 Encounter for adjustment and management of vascular access device: Secondary | ICD-10-CM | POA: Diagnosis not present

## 2021-11-22 NOTE — Progress Notes (Signed)
To complete workup and staging patient will need the following:  Echo - scheduled 8/16 Referral to Dr Berline Lopes GYN Onc - order placed Paracentesis  Port CT chest CT biopsy  Scheduled patient's paracentesis at [facility] tomorrow. Unable to schedule biopsy at this time because it is still in radiology review. I will attempt to schedule the biopsy and port placement at same appointment once biopsy out of review process.   Called patient and she is aware of paracentesis appointment including location, date and time. She asked that I also schedule the CT scan at [facility].  Scheduled CT for 11/25/2021. Patient is aware of CT appointment including date, time and location.  Oncology Nurse Navigator Documentation     11/22/2021   10:45 AM  Oncology Nurse Navigator Flowsheets  Navigator Follow Up Date: 11/26/2021  Navigator Follow Up Reason: Scan Review  Navigator Location [facility]  Navigator Encounter Type Telephone  Telephone Outgoing Call  Patient Visit Type MedOnc  Treatment Phase Abnormal Scans  Barriers/Navigation Needs Coordination of Care;Education  Education Other  Interventions Coordination of Care;Psycho-Social Support  Acuity Level 2-Minimal Needs (1-2 Barriers Identified)  Coordination of Care Radiology  Education Method Verbal  Support Groups/Services Friends and Family  Time Spent with Patient 15

## 2021-11-22 NOTE — Addendum Note (Signed)
Addended by: Burney Gauze R on: 11/22/2021 08:31 AM   Modules accepted: Orders

## 2021-11-22 NOTE — Telephone Encounter (Signed)
Spoke with the patient regarding the referral to GYN oncology. Patient scheduled as new patient with Dr Berline Lopes on 8/18 at 10:30 am. Patient given an arrival time of 10 am.  Explained to the patient the the doctor will perform a pelvic exam at this visit. Patient given the policy that no visitors under the 16 yrs are a loud in the [facility]. Patient given the address/phone number for the clinic and that the center offers free valet service.

## 2021-11-23 ENCOUNTER — Ambulatory Visit (HOSPITAL_BASED_OUTPATIENT_CLINIC_OR_DEPARTMENT_OTHER)
Admission: RE | Admit: 2021-11-23 | Discharge: 2021-11-23 | Disposition: A | Discharge status: Home / Self Care | Payer: Medicare Other | Source: Ambulatory Visit | Attending: Hematology & Oncology | Admitting: Hematology & Oncology

## 2021-11-23 ENCOUNTER — Encounter: Payer: Self-pay | Admitting: *Deleted

## 2021-11-23 DIAGNOSIS — C569 Malignant neoplasm of unspecified ovary: Secondary | ICD-10-CM | POA: Insufficient documentation

## 2021-11-23 DIAGNOSIS — R9431 Abnormal electrocardiogram [ECG] [EKG]: Secondary | ICD-10-CM | POA: Insufficient documentation

## 2021-11-23 DIAGNOSIS — R188 Other ascites: Secondary | ICD-10-CM | POA: Diagnosis not present

## 2021-11-23 DIAGNOSIS — R180 Malignant ascites: Secondary | ICD-10-CM | POA: Diagnosis not present

## 2021-11-23 HISTORY — PX: IR PARACENTESIS: IMG2679

## 2021-11-23 MED ORDER — LIDOCAINE HCL 1 % IJ SOLN
INTRAMUSCULAR | Status: AC
  Filled 2021-11-23: qty 20

## 2021-11-23 MED ORDER — LIDOCAINE HCL (PF) 1 % IJ SOLN
INTRAMUSCULAR | Status: DC | PRN
  Administered 2021-11-23: 10 mL

## 2021-11-23 NOTE — Progress Notes (Signed)
Called patient to check on her after her paracentesis. They drew of 5L. She states she is still tired and slightly sore, but feels a lot less pressure and feels like she can breathe much easier. She is hopefull that she will be able to sleep better tonight and hopefully improve more tomorrow.    Due to schedule and her daughters ability to be with her, she really wants her biopsy to be completed on Friday with her port placement. Spoke to [employer] in [employer]. She spoke to the radiologist who will be working Friday and they state that they can attempt an US biopsy with her port placement.   Dr Marin Olp has already left for the day, but will speak to him tomorrow for approval. Patient is notified that I will call her tomorrow to let her know what the final plan is. She is appreciative of the attempt.  Oncology Nurse Navigator Documentation     11/23/2021   12:30 PM  Oncology Nurse Navigator Flowsheets  Navigator Location [facility]  Navigator Encounter Type Telephone  Telephone Outgoing Call  Patient Visit Type MedOnc  Treatment Phase Abnormal Scans  Barriers/Navigation Needs Coordination of Care;Education  Education Other  Interventions Coordination of Care;Education;Psycho-Social Support  Acuity Level 2-Minimal Needs (1-2 Barriers Identified)  Coordination of Care Radiology  Education Method Verbal  Support Groups/Services Friends and Family  Time Spent with Patient 30

## 2021-11-23 NOTE — Procedures (Signed)
PROCEDURE SUMMARY:  Successful US guided paracentesis from right lateral abdomen.  Yielded 5 liters of clear yellow fluid.  No immediate complications.  Patient tolerated well.  EBL = trace  Specimen was sent for labs.  Erionna Strum S Khilee Hendricksen PA-C 11/23/2021 11:49 AM

## 2021-11-23 NOTE — Progress Notes (Unsigned)
Mir, Paula Libra, MD  Donita Brooks D Approved for CT guided biopsy of left omental mass.  CT abdomen pelvis image 55, series 2 (11/15/2021).   Mir

## 2021-11-24 ENCOUNTER — Encounter: Payer: Self-pay | Admitting: *Deleted

## 2021-11-24 ENCOUNTER — Ambulatory Visit (HOSPITAL_BASED_OUTPATIENT_CLINIC_OR_DEPARTMENT_OTHER)
Admission: RE | Admit: 2021-11-24 | Discharge: 2021-11-24 | Disposition: A | Discharge status: Home / Self Care | Payer: Medicare Other | Source: Ambulatory Visit | Attending: Family Medicine | Admitting: Family Medicine

## 2021-11-24 ENCOUNTER — Telehealth: Payer: Self-pay

## 2021-11-24 DIAGNOSIS — N838 Other noninflammatory disorders of ovary, fallopian tube and broad ligament: Secondary | ICD-10-CM

## 2021-11-24 DIAGNOSIS — R180 Malignant ascites: Secondary | ICD-10-CM | POA: Diagnosis not present

## 2021-11-24 DIAGNOSIS — R9431 Abnormal electrocardiogram [ECG] [EKG]: Secondary | ICD-10-CM | POA: Diagnosis not present

## 2021-11-24 DIAGNOSIS — E7800 Pure hypercholesterolemia, unspecified: Secondary | ICD-10-CM

## 2021-11-24 DIAGNOSIS — C569 Malignant neoplasm of unspecified ovary: Secondary | ICD-10-CM | POA: Diagnosis not present

## 2021-11-24 LAB — ECHOCARDIOGRAM COMPLETE
Area-P 1/2: 2.69 cm2
S' Lateral: 2.4 cm

## 2021-11-24 LAB — CYTOLOGY - NON PAP

## 2021-11-24 NOTE — Progress Notes (Signed)
  Echocardiogram 2D Echocardiogram has been performed.  Elmer Ramp 11/24/2021, 12:40 PM

## 2021-11-24 NOTE — Progress Notes (Signed)
Hi Maria Romero, pumping function of the heart looks good at 60 to 65%.  No wall abnormalities which is good.  The valves overall look good as well.  So at this point no concerning findings of the heart.

## 2021-11-24 NOTE — Progress Notes (Signed)
Spoke with Dr Marin Olp and we will proceed with biopsy attempt during port placement this Friday. IR notified and order placed per their request.   Called and spoke to patient. She is feeling pretty sore today and "just tired". She is aware that the biopsy will be done at the same time as the port. She is appreciative of the coordination.   Oncology Nurse Navigator Documentation     11/24/2021    8:30 AM  Oncology Nurse Navigator Flowsheets  Navigator Follow Up Date: 11/26/2021  Navigator Follow Up Reason: Port Placement;Other:  Production assistant, radio Encounter Type Appt/Treatment Plan Review;Telephone  Telephone Appt Confirmation/Clarification;Education;Outgoing Call  Patient Visit Type MedOnc  Treatment Phase Abnormal Scans  Barriers/Navigation Needs Coordination of Care;Education  Education Other  Interventions Coordination of Care;Education;Psycho-Social Support  Acuity Level 2-Minimal Needs (1-2 Barriers Identified)  Coordination of Care Radiology  Education Method Verbal  Support Groups/Services Friends and Family  Time Spent with Patient 44

## 2021-11-24 NOTE — Telephone Encounter (Signed)
Pt is requesting a call back in regards to a recent procedure she had done  that removed 5L of fluid from her lungs pt states she still receiving call with digestive health  and endo she unsure what her next step is

## 2021-11-24 NOTE — Progress Notes (Signed)
GYNECOLOGIC ONCOLOGY NEW PATIENT CONSULTATION   Patient Name: Maria Romero  Patient Age: 71 y.o. Date of Service: 11/26/21 Referring Provider: Dr. Burney Gauze  Primary Care Provider: Hali Marry, MD Consulting Provider: Jeral Pinch, MD   Assessment/Plan:  Postmenopausal patient with imaging findings concerning for metastatic GYN malignancy.  I reviewed with the patient and her daughter recent CT imaging.  We looked at the pictures together.  Based on abdominal and pelvic findings as well as pleural effusions, I discussed that I believe she likely has stage IVA ovarian cancer. I discussed that the treatment approach for this disease is typically combination of cytoreductive surgery and chemotherapy. I discussed that sequencing of this can be either with upfront debulking followed by adjuvant chemotherapy sequentially or neoadjuvant chemotherapy followed by an interval cytoreductive attempt, then additional chemotherapy. This latter approach is associated with a reduced perioperative morbidity at the time of surgery.  I discussed that decisions regarding sequencing of therapy is individualized taking into account individual patient health, in addition to the apparent tumor distribution on imaging, and likelihood of complete surgical resection at the time of surgery. I discussed that the overall survival observed in patients is equivalent for both approaches (neoadjuvant chemotherapy versus primary debulking surgery) provided that there is an optimal cytoreductive effort at the time of surgery (regardless of the timing of that surgery). Given the observed decreased morbidity of neoadjuvant chemotherapy, with preserved survival outcomes, I favor this approach for her.  Additionally, given the volume of disease, I am concerned that complete cytoreduction would not be possible at this time.  We reviewed that chemotherapy will be administered for 3 cycles followed by repeat imaging.  We  will follow the patient CA-125 in that time.  Depending on imaging response after 3 cycles, we will have a subsequent conversation about her suitability for interval debulking surgery.  Patient recently had a paracentesis and feels symptomatically much better.  Unfortunately this was not diagnostic.  She is scheduled for omental/peritoneal biopsy as well as port placement today after our visit.  Reviewed that it is important to get a diagnosis prior to proceeding with treatment to ensure the best treatment plan and expected response.  Discussed her nutritional status.  Recent albumin was low.  Creatinine was elevated last week.  I suspect that this is in the setting of poor p.o. intake, dehydration, and some third spacing due to low protein.  Discussed importance of increasing her p.o. intake, especially protein.  Discussed recommendation for genetic counseling and testing in the setting of a new suspected ovarian cancer.  Referral placed to genetics today.  Discussed difference in germline and somatic testing.  A copy of this note was sent to the patient's referring provider.   65 minutes of total time was spent for this patient encounter, including preparation, face-to-face counseling with the patient and coordination of care, and documentation of the encounter.   Jeral Pinch, MD  Division of Gynecologic Oncology  Department of Obstetrics and Gynecology  [facility]  ___________________________________________  Chief Complaint: Chief Complaint  Patient presents with   Adnexal mass   Ascites    History of Present Illness:  Maria Romero is a 71 y.o. y.o. female who is seen in consultation at the request of Dr. Marin Olp for an evaluation of constellation of findings consistent with metastatic gynecologic malignancy.  Patient reports that mid July, while with her family at the beach on vacation, she started having multiple symptoms including abdominal  distention, loose stools, abdominal pain, bloating, tenderness of her abdomen to the touch, and burning around her upper abdomen.  The symptoms worsened and when she was back in town, she called her primary care provider.  She also notes fatigue, which she thinks is partially in the setting of her low blood pressure.  She has had significant improvement of her abdominal symptoms after paracentesis but feels like the fluid is reaccumulating.  She also reports decreased appetite and early satiety.  While her bowels are formed, they are still loose.  In July, she was having difficulty emptying her bladder.  This has improved.  She denies any vaginal bleeding or discharge.  She was experiencing some shortness of breath related to her abdominal symptoms, improved after her paracentesis.  She gained approximately 8 pounds, lost this weight after her paracentesis.  Her weight today is similar to what her normal weight is.  Treatment history: RUQ ultrasound 11/03/21: 1. Moderate volume ascites in all 4 quadrants. 2. Slightly nodular surface contour of the liver suggesting cirrhosis. There are possible partially echogenic lesions arising from the liver dome. Given suspected cirrhosis, recommend MRI of the abdomen with and without contrast to exclude underlying mass lesion. This may be obtained on a nonemergent basis. 3. Mild left hydronephrosis.  MRI liver 11/08/21: 1. Minimally coarse contour of the liver without overt cirrhotic morphology. No focal liver lesion or suspicious contrast enhancement. 2. Large volume ascites throughout the abdomen. Thickened, hyperenhancing appearance of the peritoneal surfaces with suggestion of peritoneal and omental nodularity in the left upper quadrant and ventral abdomen, incompletely imaged on this examination of the abdomen only. Findings are suspicious for peritoneal carcinomatosis, perhaps from a pelvic malignancy. Recommend contrast enhanced CT of the abdomen and pelvis to  more clearly evaluate and assess the pelvis. 3. Small bilateral pleural effusions and associated atelectasis or consolidation.  11/15/21:  CT A/P 1. Large volume ascites with peritoneal thickening and omental caking in the left abdomen. Imaging features are consistent with metastatic disease. 2. Contiguous with the posterior uterine fundus and extending into the right adnexal space is a poorly marginated heterogeneous 9.6 x 8.8 x 9.4 cm mass. Imaging features are consistent with neoplasm likely of right ovarian etiology. Associated heterogeneously enhancing mass involving the posterior lower uterine segment and cervix extending posteriorly towards the cul-de-sac. This may be a drop metastasis. 3. Soft tissue fullness also noted left adnexal region without normal left ovary by CT. 4. Subtle irregularity of liver contour raising the question of but not definitive for cirrhosis. 5. Small left and tiny right pleural effusions. 6. Aortic Atherosclerosis (ICD10-I70.0).   Tumor markers 11/19/21: CA-125: 2481 CEA: 1.61 CA 19-9: 43  11/23/21: Paracentesis performed, 5 L removed. Cytology revealed no malignant cells.  11/25/21: CT chest 1. Bilateral pleural effusions greatest on the LEFT both small volume but with fissural nodularity along the major fissure in the LEFT chest. Early involvement in the chest is not excluded. Sampling of pleural fluid may be helpful. 2. No adenopathy by size criteria. Tiny pulmonary nodule in the LEFT lower lobe as described. 3. Three-vessel coronary artery disease greatest in LEFT coronary circulation. 4. Reduction in malignant ascites since previous imaging following paracentesis.  Her GYN history is notable for ovarian cysts when she was premenopausal.  She notes that these would come and go, never required treatment.  Patient lives alone with her goldendoodle.  Her daughter accompanies her to her appointment today.  PAST MEDICAL HISTORY:  Past Medical History:  Diagnosis Date   Hypercholesterolemia    Hypertension    IBS (irritable bowel syndrome)    Malignant ascites 11/19/2021   Osteopenia    Osteoporosis    Ovarian mass, right 11/19/2021     PAST SURGICAL HISTORY:  Past Surgical History:  Procedure Laterality Date   IR IMAGING GUIDED PORT INSERTION  11/26/2021   IR PARACENTESIS  11/23/2021   IR PARACENTESIS  11/26/2021   lump removal  04/11/1993   axilla right - benign   TONSILLECTOMY     TUBAL LIGATION  04/11/1988    OB/GYN HISTORY:  OB History  Gravida Para Term Preterm AB Living  '1 1 1     1  '$ SAB IAB Ectopic Multiple Live Births               # Outcome Date GA Lbr Len/2nd Weight Sex Delivery Anes PTL Lv  1 Term             No LMP recorded. Patient is postmenopausal.  Age at menarche: 48 Age at menopause: 24 Hx of HRT: Denies Hx of STDs: Denies Last pap: 2013 - [facility] History of abnormal pap smears: Denies  SCREENING STUDIES:  Last mammogram: 04/2021  Last colonoscopy: 01/2017 Last bone mineral density: 04/2021  MEDICATIONS: Outpatient Encounter Medications as of 11/26/2021  Medication Sig   amLODipine (NORVASC) 5 MG tablet TAKE ONE TABLET BY MOUTH DAILY   aspirin 81 MG EC tablet Take 81 mg by mouth daily.   atorvastatin (LIPITOR) 40 MG tablet Take 1 tablet (40 mg total) by mouth at bedtime.   Calcium Carb-Cholecalciferol (CALCIUM 1000 + D PO) Take 1 tablet by mouth daily.   cholecalciferol (VITAMIN D3) 25 MCG (1000 UNIT) tablet Take 1,000 Units by mouth daily.   Omega-3 Fatty Acids (FISH OIL MAXIMUM STRENGTH) 1200 MG CAPS Take 1 capsule by mouth 2 (two) times daily.   valsartan (DIOVAN) 160 MG tablet TAKE ONE TABLET BY MOUTH DAILY   Facility-Administered Encounter Medications as of 11/26/2021  Medication   [DISCONTINUED] lidocaine (PF) (XYLOCAINE) 1 % injection    ALLERGIES:  Allergies  Allergen Reactions   Fluvastatin Sodium Other (See Comments)    REACTION: Myalgias     FAMILY HISTORY:  Family History   Problem Relation Age of Onset   Stroke Mother    Hypertension Mother    Hyperlipidemia Mother    Glaucoma Mother    Heart attack Father 52   Diabetes Father    Hypertension Father    Hyperlipidemia Father    Cataracts Father    Breast cancer Cousin    Colon cancer Neg Hx    Ovarian cancer Neg Hx    Endometrial cancer Neg Hx    Pancreatic cancer Neg Hx    Prostate cancer Neg Hx      SOCIAL HISTORY:  Social Connections: Socially Isolated (03/26/2021)   Social Connection and Isolation Panel [NHANES]    Frequency of Communication with Friends and Family: More than three times a week    Frequency of Social Gatherings with Friends and Family: Three times a week    Attends Religious Services: Never    Active Member of Clubs or Organizations: No    Attends Archivist Meetings: Never    Marital Status: Widowed    REVIEW OF SYSTEMS:  + Decreased appetite, fatigue, ringing in the ears, cough, shortness of breath Denies fevers, chills, unexplained weight changes. Denies hearing loss, neck lumps or masses, mouth sores, or voice changes. Denies  cough or wheezing.  Denies chest pain or palpitations. Denies leg swelling. Denies blood in stools, constipation, nausea, vomiting. Denies pain with intercourse, dysuria, frequency, hematuria or incontinence. Denies hot flashes, pelvic pain, vaginal bleeding or vaginal discharge.   Denies joint pain, back pain or muscle pain/cramps. Denies itching, rash, or wounds. Denies dizziness, headaches, numbness or seizures. Denies swollen lymph nodes or glands, denies easy bruising or bleeding. Denies anxiety, depression, confusion, or decreased concentration.  Physical Exam:  Vital Signs for this encounter:  Blood pressure (!) 98/59, pulse 95, temperature (!) 97.2 F (36.2 C), temperature source Oral, resp. rate 14, height '5\' 3"'$  (1.6 m), weight 145 lb 1.6 oz (65.8 kg), SpO2 100 %. Body mass index is 25.7 kg/m. General: Alert, oriented,  no acute distress.  HEENT: Normocephalic, atraumatic. Sclera anicteric.  Chest: Clear to auscultation bilaterally. No wheezes, rhonchi, or rales. Cardiovascular: Regular rate and rhythm, no murmurs, rubs, or gallops.  Abdomen: Mildly hypoactive bowel sounds. Soft, nondistended, mildly tender diffusely to palpation. No masses or hepatosplenomegaly appreciated. + Palpable fluid wave.  Unable to appreciate omental cake. Extremities: Grossly normal range of motion. Warm, well perfused. No edema bilaterally.  Skin: No rashes or lesions.  Lymphatics: No cervical, supraclavicular, or inguinal adenopathy.  GU:  Normal external female genitalia. No lesions. No discharge or bleeding.             Bladder/urethra:  No lesions or masses, well supported bladder             Vagina: Mildly atrophic, no lesions or masses noted.             Cervix: Normal appearing, no lesions.             Uterus: Somewhat difficult to appreciate is distinct from the large mass that fills the cul-de-sac, moves in conjunction with it.  No parametrial nodularity.             Adnexa: Mass fills the cul-de-sac, some mobility, nodular.  Rectal: Confirms above findings.  LABORATORY AND RADIOLOGIC DATA:  Outside medical records were reviewed to synthesize the above history, along with the history and physical obtained during the visit.   Lab Results  Component Value Date   WBC 11.5 (H) 11/26/2021   HGB 15.4 (H) 11/26/2021   HCT 46.6 (H) 11/26/2021   PLT 519 (H) 11/26/2021   GLUCOSE 119 (H) 11/19/2021   CHOL 202 (H) 11/05/2020   TRIG 78 11/05/2020   HDL 92 11/05/2020   LDLDIRECT 63 05/10/2021   LDLCALC 93 11/05/2020   ALT 13 11/19/2021   AST 18 11/19/2021   NA 134 (L) 11/19/2021   K 4.5 11/19/2021   CL 102 11/19/2021   CREATININE 1.81 (H) 11/19/2021   BUN 40 (H) 11/19/2021   CO2 19 (L) 11/19/2021   TSH 3.83 10/24/2019   INR 1.0 11/26/2021   HGBA1C 5.2 03/19/2018

## 2021-11-25 ENCOUNTER — Ambulatory Visit (INDEPENDENT_AMBULATORY_CARE_PROVIDER_SITE_OTHER): Payer: Medicare Other

## 2021-11-25 ENCOUNTER — Encounter: Payer: Self-pay | Admitting: Family Medicine

## 2021-11-25 ENCOUNTER — Ambulatory Visit (INDEPENDENT_AMBULATORY_CARE_PROVIDER_SITE_OTHER): Payer: Medicare Other | Admitting: Family Medicine

## 2021-11-25 ENCOUNTER — Other Ambulatory Visit: Payer: Self-pay | Admitting: Internal Medicine

## 2021-11-25 VITALS — BP 86/52 | HR 105

## 2021-11-25 DIAGNOSIS — N838 Other noninflammatory disorders of ovary, fallopian tube and broad ligament: Secondary | ICD-10-CM | POA: Diagnosis not present

## 2021-11-25 DIAGNOSIS — I9581 Postprocedural hypotension: Secondary | ICD-10-CM

## 2021-11-25 DIAGNOSIS — I3139 Other pericardial effusion (noninflammatory): Secondary | ICD-10-CM | POA: Diagnosis not present

## 2021-11-25 DIAGNOSIS — R911 Solitary pulmonary nodule: Secondary | ICD-10-CM | POA: Diagnosis not present

## 2021-11-25 DIAGNOSIS — I700 Atherosclerosis of aorta: Secondary | ICD-10-CM | POA: Diagnosis not present

## 2021-11-25 DIAGNOSIS — J90 Pleural effusion, not elsewhere classified: Secondary | ICD-10-CM | POA: Diagnosis not present

## 2021-11-25 NOTE — Progress Notes (Signed)
   Acute Office Visit  Subjective:     Patient ID: Maria Romero, female    DOB: 1950/11/27, 71 y.o.   MRN: [id number]  Chief Complaint  Patient presents with   low blood pressure    HPI Patient is in today for hypotension.  She had 5 L of fluid drawn off of her abdomen yesterday.  Blood pressure immediately started dropping.  She is here today with a friend.  She immediately held her valsartan today but did take her amlodipine.  She just feels extremely tired and weak.  She has some soreness in her right abdomen where they drew off the fluid.  Noted some asymmetry in her abdomen it looks like it is bulging more on the right lower side compared to the left lower side.  She actually has an appointment tomorrow with the [facility] for a right sided ovarian mass.  They are planning on ultrasound-guided biopsy tomorrow as well as consultation.  And port placement.  ROS      Objective:    BP (!) 86/52   Pulse (!) 105   SpO2 98%    Physical Exam Vitals reviewed.  Constitutional:      Appearance: She is well-developed.  HENT:     Head: Normocephalic and atraumatic.  Eyes:     Conjunctiva/sclera: Conjunctivae normal.  Cardiovascular:     Rate and Rhythm: Normal rate.  Pulmonary:     Effort: Pulmonary effort is normal.  Abdominal:     Comments: Does have a fullness to that right lower abdomen below the umbilicus.  The skin itself looks okay.  No wounds or significant bruising.  Skin:    General: Skin is dry.     Coloration: Skin is not pale.  Neurological:     Mental Status: She is alert and oriented to person, place, and time.  Psychiatric:        Behavior: Behavior normal.     No results found for any visits on 11/25/21.      Assessment & Plan:   Problem List Items Addressed This Visit   None Visit Diagnoses     Postprocedural hypotension    -  Primary      Hypotension-I suspect just from removing so much volume.  Recommend hold both blood pressure  medications for now and see if blood pressure improves over the next couple of days.  Right sided mass-I suspect it is probably some protrusion from the right sided ovarian mass.  She does have follow-up with gynecology oncology tomorrow.  If the swelling is getting larger rapidly or blood pressure continues to drop then we may need to consider an ultrasound just to make sure there is no sign of any type of internal bleed after the procedure, though it is unlikely.  No orders of the defined types were placed in this encounter.   No follow-ups on file.  Beatrice Lecher, MD

## 2021-11-26 ENCOUNTER — Ambulatory Visit (HOSPITAL_COMMUNITY)
Admission: RE | Admit: 2021-11-26 | Discharge: 2021-11-26 | Disposition: A | Discharge status: Home / Self Care | Payer: Medicare Other | Source: Ambulatory Visit | Attending: Physician Assistant | Admitting: Physician Assistant

## 2021-11-26 ENCOUNTER — Ambulatory Visit (HOSPITAL_COMMUNITY)
Admission: RE | Admit: 2021-11-26 | Discharge: 2021-11-26 | Disposition: A | Discharge status: Home / Self Care | Payer: Medicare Other | Source: Ambulatory Visit | Attending: Hematology & Oncology | Admitting: Hematology & Oncology

## 2021-11-26 ENCOUNTER — Encounter: Payer: Self-pay | Admitting: Gynecologic Oncology

## 2021-11-26 ENCOUNTER — Other Ambulatory Visit: Payer: Self-pay

## 2021-11-26 ENCOUNTER — Inpatient Hospital Stay (HOSPITAL_BASED_OUTPATIENT_CLINIC_OR_DEPARTMENT_OTHER): Payer: Medicare Other | Admitting: Gynecologic Oncology

## 2021-11-26 ENCOUNTER — Other Ambulatory Visit: Payer: Self-pay | Admitting: Hematology & Oncology

## 2021-11-26 ENCOUNTER — Encounter (HOSPITAL_COMMUNITY): Payer: Self-pay

## 2021-11-26 ENCOUNTER — Encounter: Payer: Self-pay | Admitting: *Deleted

## 2021-11-26 VITALS — BP 98/59 | HR 95 | Temp 97.2°F | Resp 14 | Ht 63.0 in | Wt 145.1 lb

## 2021-11-26 VITALS — BP 98/49 | HR 83 | Temp 97.7°F | Resp 16 | Ht 63.0 in | Wt 145.1 lb

## 2021-11-26 DIAGNOSIS — Z87891 Personal history of nicotine dependence: Secondary | ICD-10-CM | POA: Insufficient documentation

## 2021-11-26 DIAGNOSIS — R180 Malignant ascites: Secondary | ICD-10-CM | POA: Insufficient documentation

## 2021-11-26 DIAGNOSIS — C786 Secondary malignant neoplasm of retroperitoneum and peritoneum: Secondary | ICD-10-CM | POA: Diagnosis not present

## 2021-11-26 DIAGNOSIS — C801 Malignant (primary) neoplasm, unspecified: Secondary | ICD-10-CM | POA: Diagnosis not present

## 2021-11-26 DIAGNOSIS — N838 Other noninflammatory disorders of ovary, fallopian tube and broad ligament: Secondary | ICD-10-CM | POA: Insufficient documentation

## 2021-11-26 DIAGNOSIS — R1904 Left lower quadrant abdominal swelling, mass and lump: Secondary | ICD-10-CM | POA: Diagnosis not present

## 2021-11-26 DIAGNOSIS — I10 Essential (primary) hypertension: Secondary | ICD-10-CM | POA: Insufficient documentation

## 2021-11-26 DIAGNOSIS — K589 Irritable bowel syndrome without diarrhea: Secondary | ICD-10-CM | POA: Diagnosis not present

## 2021-11-26 DIAGNOSIS — R188 Other ascites: Secondary | ICD-10-CM | POA: Diagnosis not present

## 2021-11-26 DIAGNOSIS — C569 Malignant neoplasm of unspecified ovary: Secondary | ICD-10-CM

## 2021-11-26 DIAGNOSIS — C481 Malignant neoplasm of specified parts of peritoneum: Secondary | ICD-10-CM | POA: Diagnosis not present

## 2021-11-26 DIAGNOSIS — Z452 Encounter for adjustment and management of vascular access device: Secondary | ICD-10-CM | POA: Diagnosis not present

## 2021-11-26 DIAGNOSIS — N9489 Other specified conditions associated with female genital organs and menstrual cycle: Secondary | ICD-10-CM

## 2021-11-26 DIAGNOSIS — E785 Hyperlipidemia, unspecified: Secondary | ICD-10-CM | POA: Insufficient documentation

## 2021-11-26 HISTORY — PX: IR PARACENTESIS: IMG2679

## 2021-11-26 HISTORY — PX: IR IMAGING GUIDED PORT INSERTION: IMG5740

## 2021-11-26 LAB — CBC WITH DIFFERENTIAL/PLATELET
Abs Immature Granulocytes: 0.06 10*3/uL (ref 0.00–0.07)
Basophils Absolute: 0.1 10*3/uL (ref 0.0–0.1)
Basophils Relative: 1 %
Eosinophils Absolute: 0.1 10*3/uL (ref 0.0–0.5)
Eosinophils Relative: 1 %
HCT: 46.6 % — ABNORMAL HIGH (ref 36.0–46.0)
Hemoglobin: 15.4 g/dL — ABNORMAL HIGH (ref 12.0–15.0)
Immature Granulocytes: 1 %
Lymphocytes Relative: 12 %
Lymphs Abs: 1.4 10*3/uL (ref 0.7–4.0)
MCH: 30.1 pg (ref 26.0–34.0)
MCHC: 33 g/dL (ref 30.0–36.0)
MCV: 91.2 fL (ref 80.0–100.0)
Monocytes Absolute: 0.6 10*3/uL (ref 0.1–1.0)
Monocytes Relative: 5 %
Neutro Abs: 9.3 10*3/uL — ABNORMAL HIGH (ref 1.7–7.7)
Neutrophils Relative %: 80 %
Platelets: 519 10*3/uL — ABNORMAL HIGH (ref 150–400)
RBC: 5.11 MIL/uL (ref 3.87–5.11)
RDW: 12.9 % (ref 11.5–15.5)
WBC: 11.5 10*3/uL — ABNORMAL HIGH (ref 4.0–10.5)
nRBC: 0 % (ref 0.0–0.2)

## 2021-11-26 LAB — PROTIME-INR
INR: 1 (ref 0.8–1.2)
Prothrombin Time: 12.9 seconds (ref 11.4–15.2)

## 2021-11-26 MED ORDER — LIDOCAINE-EPINEPHRINE 1 %-1:100000 IJ SOLN
INTRAMUSCULAR | Status: AC | PRN
  Administered 2021-11-26: 10 mL via INTRADERMAL

## 2021-11-26 MED ORDER — LIDOCAINE-EPINEPHRINE 1 %-1:100000 IJ SOLN
INTRAMUSCULAR | Status: AC
  Filled 2021-11-26: qty 1

## 2021-11-26 MED ORDER — LIDOCAINE HCL 1 % IJ SOLN
INTRAMUSCULAR | Status: AC | PRN
  Administered 2021-11-26: 10 mL via INTRADERMAL

## 2021-11-26 MED ORDER — LIDOCAINE HCL 1 % IJ SOLN
INTRAMUSCULAR | Status: AC
  Filled 2021-11-26: qty 20

## 2021-11-26 MED ORDER — FENTANYL CITRATE (PF) 100 MCG/2ML IJ SOLN
INTRAMUSCULAR | Status: AC | PRN
  Administered 2021-11-26: 25 ug via INTRAVENOUS

## 2021-11-26 MED ORDER — MIDAZOLAM HCL 2 MG/2ML IJ SOLN
INTRAMUSCULAR | Status: AC | PRN
  Administered 2021-11-26: .5 mg via INTRAVENOUS

## 2021-11-26 MED ORDER — SODIUM CHLORIDE 0.9 % IV SOLN
INTRAVENOUS | Status: AC | PRN
  Administered 2021-11-26: 999 mL/h via INTRAVENOUS

## 2021-11-26 MED ORDER — HEPARIN SOD (PORK) LOCK FLUSH 100 UNIT/ML IV SOLN
INTRAVENOUS | Status: AC
  Filled 2021-11-26: qty 5

## 2021-11-26 MED ORDER — HEPARIN SOD (PORK) LOCK FLUSH 100 UNIT/ML IV SOLN
INTRAVENOUS | Status: AC | PRN
  Administered 2021-11-26: 500 [IU] via INTRAVENOUS

## 2021-11-26 MED ORDER — FENTANYL CITRATE (PF) 100 MCG/2ML IJ SOLN
INTRAMUSCULAR | Status: AC | PRN
  Administered 2021-11-26: 50 ug via INTRAVENOUS

## 2021-11-26 MED ORDER — MIDAZOLAM HCL 2 MG/2ML IJ SOLN
INTRAMUSCULAR | Status: AC
  Filled 2021-11-26: qty 2

## 2021-11-26 MED ORDER — SODIUM CHLORIDE 0.9 % IV SOLN: INTRAVENOUS | Status: DC

## 2021-11-26 MED ORDER — CEFAZOLIN SODIUM-DEXTROSE 2-4 GM/100ML-% IV SOLN: 2.0000 g | Freq: Once | INTRAVENOUS | Status: DC

## 2021-11-26 MED ORDER — MIDAZOLAM HCL 2 MG/2ML IJ SOLN
INTRAMUSCULAR | Status: AC | PRN
  Administered 2021-11-26: 1 mg via INTRAVENOUS

## 2021-11-26 MED ORDER — FENTANYL CITRATE (PF) 100 MCG/2ML IJ SOLN
INTRAMUSCULAR | Status: AC
  Filled 2021-11-26: qty 2

## 2021-11-26 NOTE — Sedation Documentation (Signed)
Port a cath placement complete.

## 2021-11-26 NOTE — Sedation Documentation (Signed)
Paracentesis complete. 950cc removed.Patient tolerated procedure well.

## 2021-11-26 NOTE — Patient Instructions (Signed)
It was very nice to meet you both today.  Based on your recent CT scan, I suspect that you have metastatic ovarian cancer.  Today we discussed treatment options.  Given how much cancer I see and where I am seeing the cancer on CT scan, I have recommended that we start with chemotherapy in an effort to shrink the cancer before considering surgery.  Hopefully your biopsy today will help establish a diagnosis (this also helps make sure that we know what kind of cancer we are treating).  You would then receive 3 cycles of chemotherapy with Dr. Marin Olp.  A CT scan would be repeated after that third cycle and then you will have a visit to see me again to discuss whether it is time for surgery.  Please do not hesitate to reach out if you need anything in the meantime.  Our number is [phone].  I have also placed a referral to get you set up to see one of our genetic counselors.

## 2021-11-26 NOTE — Progress Notes (Signed)
CT scan results sent to patient via [facility]. She is getting her port and biopsy today. Will follow for pathology report.   Oncology Nurse Navigator Documentation     11/26/2021    1:00 PM  Oncology Nurse Navigator Flowsheets  Navigator Follow Up Date: 11/30/2021  Navigator Follow Up Reason: Pathology  Navigator Location [facility]  Navigator Encounter Type MyChart;Diagnostic Results;Appt/Treatment Plan Review;Scan Review  Patient Visit Type MedOnc  Treatment Phase Abnormal Scans  Barriers/Navigation Needs Coordination of Care;Education  Education Other  Interventions Education  Acuity Level 2-Minimal Needs (1-2 Barriers Identified)  Education Method Written  Support Groups/Services Friends and Family  Time Spent with Patient 15

## 2021-11-26 NOTE — Consult Note (Signed)
Chief Complaint: Patient was seen in consultation today for ovarian mass  Referring Physician(s): Ennever,Peter R  Supervising Physician: Jacqulynn Cadet  Patient Status: [facility] - Out-pt  History of Present Illness: Maria Romero is a 71 y.o. female with past medical history of HLD, HTN, IBS, malignant ascites s/p recent paracentesis 11/23/21 with 5.0 liters removed who is undergoing GYN ONC evaluation for right ovarian mass with left omental caking. IR consulted for Port-A-Cath as well as omental biopsy. Case reviewed and approved by Dr. Laurence Ferrari.   Ms. Maria Romero presents today for procedure in her usual state of health.  She has experienced low blood pressure at home, but denies lightheadedness or syncope.  Endorses fatigue. She is understanding of the goals of her procedures today including that of the three planned procedures, the omental biopsy may or may not be amenable to [place] and may require rescheduling with CT.  She is agreeable to proceed.   She has been NPO.  She does not take blood thinners.   Past Medical History:  Diagnosis Date   Hypercholesterolemia    Hypertension    IBS (irritable bowel syndrome)    Malignant ascites 11/19/2021   Osteopenia    Osteoporosis    Ovarian mass, right 11/19/2021    Past Surgical History:  Procedure Laterality Date   IR PARACENTESIS  11/23/2021   lump removal  04/11/1993   axilla right - benign   TONSILLECTOMY     TUBAL LIGATION  04/11/1988    Allergies: Fluvastatin sodium  Medications: Prior to Admission medications   Medication Sig Start Date End Date Taking? Authorizing Provider  amLODipine (NORVASC) 5 MG tablet TAKE ONE TABLET BY MOUTH DAILY 11/04/21  Yes Hali Marry, MD  aspirin 81 MG EC tablet Take 81 mg by mouth daily.   Yes [provider]  atorvastatin (LIPITOR) 40 MG tablet Take 1 tablet (40 mg total) by mouth at bedtime. 11/05/20  Yes Hali Marry, MD  Calcium Carb-Cholecalciferol  (CALCIUM 1000 + D PO) Take 1 tablet by mouth daily.   Yes [provider]  cholecalciferol (VITAMIN D3) 25 MCG (1000 UNIT) tablet Take 1,000 Units by mouth daily.   Yes [provider]  Omega-3 Fatty Acids (FISH OIL MAXIMUM STRENGTH) 1200 MG CAPS Take 1 capsule by mouth 2 (two) times daily.   Yes [provider]  valsartan (DIOVAN) 160 MG tablet TAKE ONE TABLET BY MOUTH DAILY 11/04/21  Yes Hali Marry, MD     Family History  Problem Relation Age of Onset   Stroke Mother    Hypertension Mother    Hyperlipidemia Mother    Glaucoma Mother    Heart attack Father 54   Diabetes Father    Hypertension Father    Hyperlipidemia Father    Cataracts Father    Breast cancer Cousin    Colon cancer Neg Hx    Ovarian cancer Neg Hx    Endometrial cancer Neg Hx    Pancreatic cancer Neg Hx    Prostate cancer Neg Hx     Social History   Socioeconomic History   Marital status: Widowed    Spouse name: Not on file   Number of children: 1   Years of education: 14   Highest education level: Associate degree: occupational, Hotel manager, or vocational program  Occupational History   Occupation: retired Quarry manager  Tobacco Use   Smoking status: Former    Types: Cigarettes    Quit date: 03/20/1990  Years since quitting: 31.7   Smokeless tobacco: Never   Tobacco comments:    quit 1988  Vaping Use   Vaping Use: Never used  Substance and Sexual Activity   Alcohol use: Not Currently    Alcohol/week: 2.0 standard drinks of alcohol    Types: 2 Glasses of wine per week    Comment: Wine in the evenings   Drug use: No   Sexual activity: Never    Partners: Male  Other Topics Concern   Not on file  Social History Narrative   Lives alone with her dog. Helps with her grandson pick up and volunteering at his school. She enjoys spending time with her family.   Social Determinants of Health   Financial Resource Strain: Low Risk  (03/26/2021)   Overall Financial  Resource Strain (CARDIA)    Difficulty of Paying Living Expenses: Not hard at all  Food Insecurity: No Food Insecurity (03/26/2021)   Hunger Vital Sign    Worried About Running Out of Food in the Last Year: Never true    Ran Out of Food in the Last Year: Never true  Transportation Needs: No Transportation Needs (03/26/2021)   PRAPARE - Hydrologist (Medical): No    Lack of Transportation (Non-Medical): No  Physical Activity: Inactive (03/26/2021)   Exercise Vital Sign    Days of Exercise per Week: 0 days    Minutes of Exercise per Session: 0 min  Stress: No Stress Concern Present (03/26/2021)   [place]    Feeling of Stress : Not at all  Social Connections: Socially Isolated (03/26/2021)   Social Connection and Isolation Panel [NHANES]    Frequency of Communication with Friends and Family: More than three times a week    Frequency of Social Gatherings with Friends and Family: Three times a week    Attends Religious Services: Never    Active Member of Clubs or Organizations: No    Attends Archivist Meetings: Never    Marital Status: Widowed     Review of Systems: A 12 point ROS discussed and pertinent positives are indicated in the HPI above.  All other systems are negative.  Review of Systems  Constitutional:  Positive for activity change and fatigue. Negative for fever.  Respiratory:  Negative for cough and shortness of breath.   Cardiovascular:  Negative for chest pain.  Gastrointestinal:  Positive for abdominal distention. Negative for abdominal pain.  Genitourinary:  Negative for dysuria.  Musculoskeletal:  Negative for back pain.  Psychiatric/Behavioral:  Negative for behavioral problems and confusion.     Vital Signs: BP 98/61   Pulse 84   Temp (!) 97.5 F (36.4 C) (Oral)   Resp 15   Ht '5\' 3"'$  (1.6 m)   Wt 145 lb 1 oz (65.8 kg)   SpO2 96%   BMI 25.70 kg/m    Physical Exam Vitals and nursing note reviewed.  Constitutional:      General: She is not in acute distress.    Appearance: Normal appearance. She is not ill-appearing.  HENT:     Mouth/Throat:     Mouth: Mucous membranes are moist.     Pharynx: Oropharynx is clear.  Cardiovascular:     Rate and Rhythm: Normal rate and regular rhythm.  Pulmonary:     Effort: Pulmonary effort is normal.     Breath sounds: Normal breath sounds.  Abdominal:     General: Abdomen  is flat. There is distension.  Musculoskeletal:     Cervical back: Normal range of motion and neck supple.  Skin:    General: Skin is warm and dry.  Neurological:     General: No focal deficit present.     Mental Status: She is alert and oriented to person, place, and time. Mental status is at baseline.  Psychiatric:        Mood and Affect: Mood normal.        Behavior: Behavior normal.        Thought Content: Thought content normal.        Judgment: Judgment normal.      MD Evaluation Airway: WNL Heart: WNL Abdomen: WNL Chest/ Lungs: WNL ASA  Classification: 3 Mallampati/Airway Score: Two   Imaging: CT Chest Wo Contrast  Result Date: 11/25/2021 CLINICAL DATA:  History of ovarian neoplasm with recurrent disease, staging evaluation of the chest. * Tracking Code: BO * EXAM: CT CHEST WITHOUT CONTRAST TECHNIQUE: Multidetector CT imaging of the chest was performed following the standard protocol without IV contrast. RADIATION DOSE REDUCTION: This exam was performed according to the departmental dose-optimization program which includes automated exposure control, adjustment of the mA and/or kV according to patient size and/or use of iterative reconstruction technique. COMPARISON:  CT of the abdomen and pelvis dated November 15, 2021 FINDINGS: Cardiovascular: Calcified aortic atherosclerotic changes. Normal caliber of the thoracic aorta. Three-vessel coronary artery disease greatest in LEFT coronary circulation. Normal heart  size. Small pericardial effusion. Small pre pericardial lymph nodes largest approximately 7 mm. Central pulmonary vasculature is normal caliber. Limited assessment of cardiovascular structures given lack of intravenous contrast. Mediastinum/Nodes: Mildly patulous esophagus. No adenopathy in the chest. Lungs/Pleura: Small RIGHT and LEFT pleural effusions LEFT greater than RIGHT. Subtle fissural irregularity along the superior major fissure in the LEFT chest (image 34/3). Tiny pulmonary nodule LEFT lower lobe (image 95/3) 3 mm. Volume loss at the lung bases greatest on the LEFT. Upper Abdomen: Reduction in malignant ascites since previous imaging following paracentesis. No acute findings in the upper abdomen in this patient with known peritoneal carcinomatosis. Musculoskeletal: No acute bone finding. No destructive bone process. Spinal degenerative changes. IMPRESSION: 1. Bilateral pleural effusions greatest on the LEFT both small volume but with fissural nodularity along the major fissure in the LEFT chest. Early involvement in the chest is not excluded. Sampling of pleural fluid may be helpful. 2. No adenopathy by size criteria. Tiny pulmonary nodule in the LEFT lower lobe as described. 3. Three-vessel coronary artery disease greatest in LEFT coronary circulation. 4. Reduction in malignant ascites since previous imaging following paracentesis. Aortic Atherosclerosis (ICD10-I70.0). Electronically Signed   By: Zetta Bills M.D.   On: 11/25/2021 13:18   ECHOCARDIOGRAM COMPLETE  Result Date: 11/24/2021    ECHOCARDIOGRAM REPORT   Patient Name:   Maria Romero Date of Exam: 11/24/2021 Medical Rec #:  [id number]        Height:       62.0 in Accession #:    [id number]       Weight:       155.1 lb Date of Birth:  03-24-1951        BSA:          1.716 m Patient Age:    48 years         BP:           83/58 mmHg Patient Gender: F  HR:           89 bpm. Exam Location:  [place] Procedure: 2D Echo, Cardiac  Doppler and Color Doppler Indications:    Abnormal EKG  History:        Patient has no prior history of Echocardiogram examinations.                 Signs/Symptoms:Edema. Ovarian cancer.  Sonographer:    Merrie Roof RDCS Referring Phys: [id number] CATHERINE D Putnam  1. A technically difficult study. Left ventricular ejection fraction, by estimation, is 60 to 65%. The left ventricle has normal function. The left ventricle has no regional wall motion abnormalities. Left ventricular diastolic parameters are consistent  with Grade I diastolic dysfunction (impaired relaxation).  2. Right ventricular systolic function is normal. The right ventricular size is normal.  3. The mitral valve is normal in structure. No evidence of mitral valve regurgitation. No evidence of mitral stenosis.  4. The aortic valve is normal in structure. Aortic valve regurgitation is not visualized. No aortic stenosis is present.  5. The inferior vena cava is normal in size with greater than 50% respiratory variability, suggesting right atrial pressure of 3 mmHg. FINDINGS  Left Ventricle: Left ventricular ejection fraction, by estimation, is 60 to 65%. The left ventricle has normal function. The left ventricle has no regional wall motion abnormalities. The left ventricular internal cavity size was normal in size. There is  no left ventricular hypertrophy. Left ventricular diastolic parameters are consistent with Grade I diastolic dysfunction (impaired relaxation). Right Ventricle: The right ventricular size is normal. No increase in right ventricular wall thickness. Right ventricular systolic function is normal. Left Atrium: Left atrial size was normal in size. Right Atrium: Right atrial size was normal in size. Pericardium: There is no evidence of pericardial effusion. Mitral Valve: The mitral valve is normal in structure. No evidence of mitral valve regurgitation. No evidence of mitral valve stenosis. Tricuspid Valve: The tricuspid valve  is normal in structure. Tricuspid valve regurgitation is not demonstrated. No evidence of tricuspid stenosis. Aortic Valve: The aortic valve is normal in structure. Aortic valve regurgitation is not visualized. No aortic stenosis is present. Pulmonic Valve: The pulmonic valve was normal in structure. Pulmonic valve regurgitation is not visualized. No evidence of pulmonic stenosis. Aorta: The aortic root is normal in size and structure. Venous: The inferior vena cava is normal in size with greater than 50% respiratory variability, suggesting right atrial pressure of 3 mmHg. IAS/Shunts: No atrial level shunt detected by color flow Doppler.  LEFT VENTRICLE PLAX 2D LVIDd:         3.50 cm   Diastology LVIDs:         2.40 cm   LV e' medial:    5.33 cm/s LV PW:         1.10 cm   LV E/e' medial:  9.0 LV IVS:        1.00 cm   LV e' lateral:   7.72 cm/s LVOT diam:     1.80 cm   LV E/e' lateral: 6.2 LV SV:         33 LV SV Index:   19 LVOT Area:     2.54 cm  LEFT ATRIUM             Index LA Vol (A2C):   35.7 ml 20.80 ml/m LA Vol (A4C):   33.5 ml 19.52 ml/m LA Biplane Vol: 35.2 ml 20.51 ml/m  AORTIC VALVE LVOT Vmax:  93.30 cm/s LVOT Vmean:  62.200 cm/s LVOT VTI:    0.131 m MITRAL VALVE MV Area (PHT): 2.69 cm    SHUNTS MV Decel Time: 282 msec    Systemic VTI:  0.13 m MV E velocity: 48.00 cm/s  Systemic Diam: 1.80 cm MV A velocity: 71.10 cm/s MV E/A ratio:  0.68 Jyl Heinz MD Electronically signed by Jyl Heinz MD Signature Date/Time: 11/24/2021/12:45:52 PM    Final    IR Paracentesis  Result Date: 11/23/2021 INDICATION: Metastatic cancer, presumably ovarian, with ascites. Request for diagnostic and therapeutic paracentesis. EXAM: ULTRASOUND GUIDED PARACENTESIS MEDICATIONS: 1% lidocaine 10 mL COMPLICATIONS: None immediate. PROCEDURE: Informed written consent was obtained from the patient after a discussion of the risks, benefits and alternatives to treatment. A timeout was performed prior to the initiation of the  procedure. Initial ultrasound scanning demonstrates a large amount of ascites within the right lower abdominal quadrant. The right lower abdomen was prepped and draped in the usual sterile fashion. 1% lidocaine was used for local anesthesia. Following this, a 19 gauge, 7-cm, Yueh catheter was introduced. An ultrasound image was saved for documentation purposes. The paracentesis was performed. The catheter was removed and a dressing was applied. The patient tolerated the procedure well without immediate post procedural complication. FINDINGS: A total of approximately 5 L of clear yellow fluid was removed. Samples were sent to the laboratory as requested by the clinical team. IMPRESSION: Successful ultrasound-guided paracentesis yielding 5 liters of peritoneal fluid. Procedure performed by: Soyla Dryer, NP Electronically Signed   By: Ruthann Cancer M.D.   On: 11/23/2021 12:16   CT ABDOMEN PELVIS W CONTRAST  Result Date: 11/16/2021 CLINICAL DATA:  Question of peritoneal carcinomatosis on abdominal MRI. EXAM: CT ABDOMEN AND PELVIS WITH CONTRAST TECHNIQUE: Multidetector CT imaging of the abdomen and pelvis was performed using the standard protocol following bolus administration of intravenous contrast. RADIATION DOSE REDUCTION: This exam was performed according to the departmental dose-optimization program which includes automated exposure control, adjustment of the mA and/or kV according to patient size and/or use of iterative reconstruction technique. CONTRAST:  164m OMNIPAQUE IOHEXOL 300 MG/ML  SOLN COMPARISON:  Abdominal MRI 11/08/2021. FINDINGS: Lower chest: Dependent atelectasis noted bilaterally with small left and tiny right pleural effusions. Hepatobiliary: No suspicious focal abnormality within the liver parenchyma. As noted on MRI, there is subtle irregularity of liver contour raising the question of but not definitive for cirrhosis. No suspicious focal abnormality within the liver parenchyma. There is  no evidence for gallstones, gallbladder wall thickening, or pericholecystic fluid. No intrahepatic or extrahepatic biliary dilation. Pancreas: No focal mass lesion. No dilatation of the main duct. No intraparenchymal cyst. No peripancreatic edema. Spleen: No splenomegaly. No focal mass lesion. Adrenals/Urinary Tract: No adrenal nodule or mass. Central sinus cysts noted in the kidneys bilaterally. No evidence for hydroureter. The urinary bladder appears normal for the degree of distention. Stomach/Bowel: Stomach is decompressed. Duodenum is normally positioned as is the ligament of Treitz. No small bowel wall thickening. No small bowel dilatation. The terminal ileum is normal. The appendix is normal. No gross colonic mass. No colonic wall thickening. Vascular/Lymphatic: There is moderate atherosclerotic calcification of the abdominal aorta without aneurysm. There is no gastrohepatic or hepatoduodenal ligament lymphadenopathy. No retroperitoneal or mesenteric lymphadenopathy. No pelvic sidewall lymphadenopathy. Reproductive: 3.9 x 4.2 x 3.6 cm heterogeneously enhancing mass with stippled calcification involves the posterior lower uterine segment and cervix extending posteriorly towards the cul-de-sac (see sagittal image 68/6). Contiguous with the posterior uterine fundus and  extending into the right adnexal space is a poorly marginated heterogeneous 9.6 x 8.8 x 9.4 cm mass. Soft tissue fullness also noted left adnexal region without normal left ovary by CT. Other: Large volume ascites is noted in the abdomen and pelvis. Omental caking is visible in the left abdomen (see axial image 54/series 2). Peritoneal thickening noted in the pelvis (79/2). Musculoskeletal: No worrisome lytic or sclerotic osseous abnormality. IMPRESSION: 1. Large volume ascites with peritoneal thickening and omental caking in the left abdomen. Imaging features are consistent with metastatic disease. 2. Contiguous with the posterior uterine fundus  and extending into the right adnexal space is a poorly marginated heterogeneous 9.6 x 8.8 x 9.4 cm mass. Imaging features are consistent with neoplasm likely of right ovarian etiology. Associated heterogeneously enhancing mass involving the posterior lower uterine segment and cervix extending posteriorly towards the cul-de-sac. This may be a drop metastasis. 3. Soft tissue fullness also noted left adnexal region without normal left ovary by CT. 4. Subtle irregularity of liver contour raising the question of but not definitive for cirrhosis. 5. Small left and tiny right pleural effusions. 6. Aortic Atherosclerosis (ICD10-I70.0). Electronically Signed   By: Misty Stanley M.D.   On: 11/16/2021 08:38   MR LIVER W WO CONTRAST  Result Date: 11/08/2021 CLINICAL DATA:  Suspected cirrhosis identified by ultrasound EXAM: MRI ABDOMEN WITHOUT AND WITH CONTRAST TECHNIQUE: Multiplanar multisequence MR imaging of the abdomen was performed both before and after the administration of intravenous contrast. CONTRAST:  7.25m GADAVIST GADOBUTROL 1 MMOL/ML IV SOLN COMPARISON:  Right upper quadrant ultrasound, 11/03/2021 FINDINGS: Lower chest: Small bilateral pleural effusions and associated atelectasis or consolidation. Moderate hiatal hernia. Hepatobiliary: Minimally coarse contour of the liver. No mass or other parenchymal abnormality identified. No gallstones. No biliary ductal dilatation. Pancreas: No mass, inflammatory changes, or other parenchymal abnormality identified.No pancreatic ductal dilatation. Spleen:  Within normal limits in size and appearance. Adrenals/Urinary Tract: Normal adrenal glands. No renal masses or suspicious contrast enhancement identified. No evidence of hydronephrosis. Stomach/Bowel: Visualized portions within the abdomen are unremarkable. Vascular/Lymphatic: No pathologically enlarged lymph nodes identified. No abdominal aortic aneurysm demonstrated. Other: Large volume ascites throughout the abdomen.  Thickened, hyperenhancing appearance of the peritoneal surfaces (series 16, image 27), with suggestion of peritoneal and omental nodularity in the left upper quadrant and ventral abdomen, incompletely imaged on this examination of the abdomen only. (Series 16, image 45, series 17, image 66). Musculoskeletal: No suspicious osseous lesions identified. IMPRESSION: 1. Minimally coarse contour of the liver without overt cirrhotic morphology. No focal liver lesion or suspicious contrast enhancement. 2. Large volume ascites throughout the abdomen. Thickened, hyperenhancing appearance of the peritoneal surfaces with suggestion of peritoneal and omental nodularity in the left upper quadrant and ventral abdomen, incompletely imaged on this examination of the abdomen only. Findings are suspicious for peritoneal carcinomatosis, perhaps from a pelvic malignancy. Recommend contrast enhanced CT of the abdomen and pelvis to more clearly evaluate and assess the pelvis. 3. Small bilateral pleural effusions and associated atelectasis or consolidation. These results will be called to the ordering clinician or representative by the Radiologist Assistant, and communication documented in the PACS or [employer] Electronically Signed   By: ADelanna AhmadiM.D.   On: 11/08/2021 09:16   [place]oreaAbdomen Complete  Result Date: 11/03/2021 CLINICAL DATA:  Right upper quadrant tenderness and epigastric pain EXAM: ABDOMEN ULTRASOUND COMPLETE COMPARISON:  None Available. FINDINGS: Gallbladder: No gallstones or wall thickening visualized. No sonographic Murphy sign noted by sonographer. Common bile  duct: Diameter: 4 mm Liver: Parenchymal echogenicity is diffusely increased and coarsened. There are areas of surface nodularity suggesting cirrhosis. There is a possible partially exophytic echogenic lesion arising from the liver dome measuring up to 1.0 cm. And a smaller lesion measuring 0.6 cm. Portal vein is patent on color Doppler imaging with  normal direction of blood flow towards the liver. IVC: No abnormality visualized. Pancreas: Not well assessed. Spleen: Size and appearance within normal limits. Right Kidney: Length: 8.8 cm. Echogenicity within normal limits. No mass or hydronephrosis visualized. Left Kidney: Length: 8.4 cm. There is mild left hydronephrosis. There is no mass. Abdominal aorta: No aneurysm visualized. Other findings: There is moderate ascites in all 4 quadrants. IMPRESSION: 1. Moderate volume ascites in all 4 quadrants. 2. Slightly nodular surface contour of the liver suggesting cirrhosis. There are possible partially echogenic lesions arising from the liver dome. Given suspected cirrhosis, recommend MRI of the abdomen with and without contrast to exclude underlying mass lesion. This may be obtained on a nonemergent basis. 3. Mild left hydronephrosis. Electronically Signed   By: Valetta Mole M.D.   On: 11/03/2021 10:07    Labs:  CBC: Recent Labs    11/03/21 0000 11/19/21 1510 11/26/21 1350  WBC 7.7 11.5* 11.5*  HGB 13.7 13.5 15.4*  HCT 40.2 41.6 46.6*  PLT 325 419* 519*    COAGS: No results for input(s): "INR", "APTT" in the last 8760 hours.  BMP: Recent Labs    05/10/21 0000 05/25/21 0000 11/03/21 0000 11/19/21 1510  NA 141 140 140 134*  K 4.6 4.9 4.4 4.5  CL 103 102 107 102  CO2 32 27 23 19*  GLUCOSE 98 94 88 119*  BUN '15 20 18 '$ 40*  CALCIUM 10.4 10.2 9.2 9.6  CREATININE 0.99 1.01* 0.93 1.81*  GFRNONAA  --   --   --  29*    LIVER FUNCTION TESTS: Recent Labs    11/03/21 0000 11/19/21 1510  BILITOT 0.6 0.5  AST 24 18  ALT 19 13  ALKPHOS  --  80  PROT 6.0* 6.5  ALBUMIN  --  3.9    TUMOR MARKERS: No results for input(s): "AFPTM", "CEA", "CA199", "CHROMGRNA" in the last 8760 hours.  Assessment and Plan: Patient with past medical history of HTN, IBS presents with complaint of ovarian mass.  IR consulted for ovarian mass biopsy, Port-A-Cath placement at the request of Dr.  Marin Olp. Case reviewed by Dr. Laurence Ferrari who approves patient for procedure.  Patient presents today in their usual state of health. She does feel she has a small amount of fluid recurrence and is agreeable to paracentesis.  She has been NPO and is not currently on blood thinners.   Risks and benefits was discussed with the patient and/or patient's family including, but not limited to bleeding, infection, damage to adjacent structures or low yield requiring additional tests.  All of the questions were answered and there is agreement to proceed.  Consent signed and in chart.  Thank you for this interesting consult.  I greatly enjoyed meeting PATRENA SANTALUCIA and look forward to participating in their care.  A copy of this report was sent to the requesting provider on this date.  Electronically Signed: Docia Barrier, PA 11/26/2021, 2:21 PM   I spent a total of 40 Minutes    in face to face in clinical consultation, greater than 50% of which was counseling/coordinating care for ovarian mass.

## 2021-11-26 NOTE — Sedation Documentation (Signed)
Patient transported to CT via stretcher, accompanied by this RN.

## 2021-11-26 NOTE — Progress Notes (Signed)
Ellerbe with Dr Laurence Ferrari gave an update on blood pressure.  Obtained order for NS bolus given and for 02 prn at  2 L/M.

## 2021-11-26 NOTE — Discharge Instructions (Addendum)
Discharge Instructions:   Please call Interventional Radiology clinic [phone] with any questions or concerns.  You may remove your dressing and shower tomorrow.  Do not use EMLA / Lidocaine cream for 2 weeks post Port Insertion.   Implanted Port Insertion, Care After The following information offers guidance on how to care for yourself after your procedure. Your health care provider may also give you more specific instructions. If you have problems or questions, contact your health care provider. What can I expect after the procedure? After the procedure, it is common to have: Discomfort at the port insertion site. Bruising on the skin over the port. This should improve over 3-4 days. Follow these instructions at home: [facility] care After your port is placed, you will get a manufacturer's information card. The card has information about your port. Keep this card with you at all times. Take care of the port as told by your health care provider. Ask your health care provider if you or a family member can get training for taking care of the port at home. A home health care nurse will be be available to help care for the port. Make sure to remember what type of port you have. Incision care     Follow instructions from your health care provider about how to take care of your port insertion site. Make sure you: Wash your hands with soap and water for at least 20 seconds before and after you change your bandage (dressing). If soap and water are not available, use hand sanitizer. Change your dressing as told by your health care provider. Leave stitches (sutures), skin glue, or adhesive strips in place. These skin closures may need to stay in place for 2 weeks or longer. If adhesive strip edges start to loosen and curl up, you may trim the loose edges. Do not remove adhesive strips completely unless your health care provider tells you to do that. Check your port insertion site every day for signs  of infection. Check for: Redness, swelling, or pain. Fluid or blood. Warmth. Pus or a bad smell. Activity Return to your normal activities as told by your health care provider. Ask your health care provider what activities are safe for you. You may have to avoid lifting. Ask your health care provider how much you can safely lift. General instructions Take over-the-counter and prescription medicines only as told by your health care provider. Do not take baths, swim, or use a hot tub until your health care provider approves. Ask your health care provider if you may take showers. You may only be allowed to take sponge baths. If you were given a sedative during the procedure, it can affect you for several hours. Do not drive or operate machinery until your health care provider says that it is safe. Wear a medical alert bracelet in case of an emergency. This will tell any health care providers that you have a port. Keep all follow-up visits. This is important. Contact a health care provider if: You cannot flush your port with saline as directed, or you cannot draw blood from the port. You have a fever or chills. You have redness, swelling, or pain around your port insertion site. You have fluid or blood coming from your port insertion site. Your port insertion site feels warm to the touch. You have pus or a bad smell coming from the port insertion site. Get help right away if: You have chest pain or shortness of breath. You have bleeding from your  port that you cannot control. These symptoms may be an emergency. Get help right away. Call 911. Do not wait to see if the symptoms will go away. Do not drive yourself to the hospital. Summary Take care of the port as told by your health care provider. Keep the manufacturer's information card with you at all times. Change your dressing as told by your health care provider. Contact a health care provider if you have a fever or chills or if you have  redness, swelling, or pain around your port insertion site. Keep all follow-up visits. This information is not intended to replace advice given to you by your health care provider. Make sure you discuss any questions you have with your health care provider. Document Revised: 09/29/2020 Document Reviewed: 09/29/2020 Elsevier Patient Education  2023 Elsevier I

## 2021-11-30 ENCOUNTER — Encounter: Payer: Self-pay | Admitting: *Deleted

## 2021-11-30 NOTE — Progress Notes (Signed)
Per Dr Marin Olp, request for [facility] testing sent on specimen [phone] DOS 11/26/2021. He would also like to see patient this week for treatment discussion.   Called and spoke to patient. She will come in tomorrow for treatment discussion. 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 We will attempt to also include chemo education tomorrow. Educator aware.   Oncology Nurse Navigator Documentation     11/30/2021    9:00 AM  Oncology Nurse Navigator Flowsheets  Confirmed Diagnosis Date 11/26/2021  Navigator Follow Up Date: 12/01/2021  Navigator Follow Up Reason: Follow-up After Biopsy  Navigator Location [facility]  Navigator Encounter Type Pathology Review;Molecular Studies;Telephone  Telephone Appt Confirmation/Clarification;Education;Outgoing Call  Patient Visit Type MedOnc  Treatment Phase Pre-Tx/Tx Discussion  Barriers/Navigation Needs Coordination of Care;Education  Education Other  Interventions Education;Coordination of Care;Psycho-Social Support  Acuity Level 2-Minimal Needs (1-2 Barriers Identified)  Coordination of Care Pathology;Appts  Education Method Verbal  Support Groups/Services Friends and Family  Time Spent with Patient 81

## 2021-12-01 ENCOUNTER — Encounter: Payer: Self-pay | Admitting: *Deleted

## 2021-12-01 ENCOUNTER — Encounter: Payer: Self-pay | Admitting: Hematology & Oncology

## 2021-12-01 ENCOUNTER — Inpatient Hospital Stay: Payer: Medicare Other | Admitting: Hematology & Oncology

## 2021-12-01 ENCOUNTER — Other Ambulatory Visit: Payer: Self-pay | Admitting: *Deleted

## 2021-12-01 VITALS — BP 105/74 | HR 114 | Temp 97.5°F | Resp 18

## 2021-12-01 DIAGNOSIS — C561 Malignant neoplasm of right ovary: Secondary | ICD-10-CM

## 2021-12-01 DIAGNOSIS — K669 Disorder of peritoneum, unspecified: Secondary | ICD-10-CM | POA: Diagnosis not present

## 2021-12-01 DIAGNOSIS — R109 Unspecified abdominal pain: Secondary | ICD-10-CM | POA: Diagnosis not present

## 2021-12-01 DIAGNOSIS — I10 Essential (primary) hypertension: Secondary | ICD-10-CM | POA: Diagnosis not present

## 2021-12-01 DIAGNOSIS — R971 Elevated cancer antigen 125 [CA 125]: Secondary | ICD-10-CM | POA: Diagnosis not present

## 2021-12-01 DIAGNOSIS — K580 Irritable bowel syndrome with diarrhea: Secondary | ICD-10-CM | POA: Diagnosis not present

## 2021-12-01 DIAGNOSIS — Z5111 Encounter for antineoplastic chemotherapy: Secondary | ICD-10-CM | POA: Diagnosis not present

## 2021-12-01 DIAGNOSIS — E7800 Pure hypercholesterolemia, unspecified: Secondary | ICD-10-CM | POA: Diagnosis not present

## 2021-12-01 DIAGNOSIS — Z7189 Other specified counseling: Secondary | ICD-10-CM | POA: Diagnosis not present

## 2021-12-01 DIAGNOSIS — Z803 Family history of malignant neoplasm of breast: Secondary | ICD-10-CM | POA: Diagnosis not present

## 2021-12-01 DIAGNOSIS — R6881 Early satiety: Secondary | ICD-10-CM | POA: Diagnosis not present

## 2021-12-01 DIAGNOSIS — R140 Abdominal distension (gaseous): Secondary | ICD-10-CM | POA: Diagnosis not present

## 2021-12-01 DIAGNOSIS — R978 Other abnormal tumor markers: Secondary | ICD-10-CM | POA: Diagnosis not present

## 2021-12-01 DIAGNOSIS — Z87891 Personal history of nicotine dependence: Secondary | ICD-10-CM | POA: Diagnosis not present

## 2021-12-01 DIAGNOSIS — Z79899 Other long term (current) drug therapy: Secondary | ICD-10-CM | POA: Diagnosis not present

## 2021-12-01 DIAGNOSIS — R180 Malignant ascites: Secondary | ICD-10-CM | POA: Diagnosis not present

## 2021-12-01 DIAGNOSIS — C786 Secondary malignant neoplasm of retroperitoneum and peritoneum: Secondary | ICD-10-CM | POA: Diagnosis not present

## 2021-12-01 DIAGNOSIS — Z7982 Long term (current) use of aspirin: Secondary | ICD-10-CM | POA: Diagnosis not present

## 2021-12-01 DIAGNOSIS — J90 Pleural effusion, not elsewhere classified: Secondary | ICD-10-CM | POA: Diagnosis not present

## 2021-12-01 DIAGNOSIS — M810 Age-related osteoporosis without current pathological fracture: Secondary | ICD-10-CM | POA: Diagnosis not present

## 2021-12-01 HISTORY — DX: Malignant neoplasm of right ovary: C56.1

## 2021-12-01 HISTORY — DX: Other specified counseling: Z71.89

## 2021-12-01 LAB — SURGICAL PATHOLOGY

## 2021-12-01 MED ORDER — PROCHLORPERAZINE MALEATE 10 MG PO TABS: 10.0000 mg | ORAL_TABLET | Freq: Four times a day (QID) | ORAL | 1 refills | Status: DC | PRN

## 2021-12-01 MED ORDER — ONDANSETRON HCL 8 MG PO TABS: 8.0000 mg | ORAL_TABLET | Freq: Three times a day (TID) | ORAL | 1 refills | Status: DC | PRN

## 2021-12-01 MED ORDER — DEXAMETHASONE 4 MG PO TABS: ORAL_TABLET | ORAL | 1 refills | Status: DC

## 2021-12-01 MED ORDER — LIDOCAINE-PRILOCAINE 2.5-2.5 % EX CREA: TOPICAL_CREAM | CUTANEOUS | 3 refills | Status: DC

## 2021-12-01 NOTE — Progress Notes (Signed)
Patient in chemotherapy education class with  daughter Marzetta Board.  Discussed side effects of Carboplatin, Taxol   which include but are not limited to myelosuppression, decreased appetite, fatigue, fever, allergic or infusional reaction, mucositis, cardiac toxicity, cough, SOB, altered taste, nausea and vomiting, diarrhea, constipation, elevated LFTs myalgia and arthralgias, hair loss or thinning, rash, skin dryness, nail changes, peripheral neuropathy, discolored urine, delayed wound healing, mental changes (Chemo brain), increased risk of infections, weight loss.  Reviewed infusion room and office policy and procedure and phone numbers 24 hours x 7 days a week  Reviewed when to call the office with any concerns or problems.  Scientist, clinical (histocompatibility and immunogenetics) given.  Discussed portacath insertion and EMLA cream administration.  Antiemetic protocol and chemotherapy schedule reviewed. Patient verbalized understanding of chemotherapy indications and possible side effects.  Teachback done

## 2021-12-01 NOTE — Progress Notes (Signed)
Patient here to discuss treatment with Dr Marin Olp. She also agrees to have chemo education after she finishes her appointment with the doctor. Her daughter is with her.   Will follow up and schedule first treatment once PA obtained.   Oncology Nurse Navigator Documentation     12/01/2021    2:00 PM  Oncology Nurse Navigator Flowsheets  Navigator Follow Up Date: 12/02/2021  Navigator Follow Up Reason: Appointment Review  Navigator Location [facility]  Navigator Encounter Type Follow-up Appt  Patient Visit Type MedOnc  Treatment Phase Pre-Tx/Tx Discussion  Barriers/Navigation Needs Coordination of Care;Education  Education Other  Interventions Education;Psycho-Social Support  Acuity Level 2-Minimal Needs (1-2 Barriers Identified)  Education Method Verbal  Support Groups/Services Friends and Family  Time Spent with Patient 30

## 2021-12-01 NOTE — Progress Notes (Signed)
START ON PATHWAY REGIMEN - Ovarian     A cycle is every 21 days:     Paclitaxel      Carboplatin   **Always confirm dose/schedule in your pharmacy ordering system**  Patient Characteristics: Preoperative or Nonsurgical Candidate (Clinical Staging), Newly Diagnosed, Neoadjuvant Therapy followed by Surgery BRCA Mutation Status: Awaiting Test Results Therapeutic Status: Preoperative or Nonsurgical Candidate (Clinical Staging) AJCC T Category: cT3c AJCC 8 Stage Grouping: IIIC AJCC N Category: cN1b AJCC M Category: cM0 Therapy Plan: Neoadjuvant Therapy followed by Surgery Intent of Therapy: Curative Intent, Discussed with Patient 

## 2021-12-01 NOTE — Progress Notes (Signed)
Hematology and Oncology Follow Up Visit  Maria Romero [id number] 07/08/1950 71 y.o. 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 Maria Romero is back for follow-up.  This is her second office visit.  When we first saw her, she had a lot of ascites.  She has had about 6 L of fluid removed so far.  Surprisingly, when she had the initial paracentesis, the pathology was negative for any malignant cells.  When we saw her initially, her CA-125 was 2500.  Although this was highly suggestive of ovarian cancer.  She did have a biopsy done.  This was a omental biopsy.  This was done on 11/26/2021.  The pathology report [id number]) showed a poorly differentiated carcinoma that was consistent with a high-grade serous adenocarcinoma.  She was seen by Dr. Berline Lopes of Gynecologic Oncology, who felt that she would be a good candidate for neoadjuvant chemotherapy.  We did do a CT of the chest.  This was done on 11/25/2021.  This did not show any obvious disease in the chest.  There is some bilateral pleural effusions which I suspect was probably from her ascites.  She has had a Port-A-Cath placed.  She is feeling much better.  She still has a little bit of pain in the pelvic area.  This is partially from her primary tumor is.  I think that she would be able to tolerate neoadjuvant carboplatinum/Taxol.  Currently, her performance status is ECOG 1.  Medications:  Current Outpatient Medications:    aspirin 81 MG EC tablet, Take 81 mg by mouth daily., Disp: , Rfl:    atorvastatin (LIPITOR) 40 MG tablet, Take 1 tablet (40 mg total) by mouth at bedtime., Disp: 90 tablet, Rfl: 3   Calcium Carb-Cholecalciferol (CALCIUM 1000 + D PO), Take 1 tablet by mouth daily., Disp: , Rfl:    cholecalciferol (VITAMIN D3) 25 MCG (1000 UNIT) tablet, Take  1,000 Units by mouth daily., Disp: , Rfl:    Omega-3 Fatty Acids (FISH OIL MAXIMUM STRENGTH) 1200 MG CAPS, Take 1 capsule by mouth 2 (two) times daily., Disp: , Rfl:    omeprazole (PRILOSEC) 10 MG capsule, Take by mouth daily. Unsure dose**, Disp: , Rfl:    amLODipine (NORVASC) 5 MG tablet, TAKE ONE TABLET BY MOUTH DAILY (Patient not taking: Reported on 12/01/2021), Disp: 90 tablet, Rfl: 1   dexamethasone (DECADRON) 4 MG tablet, Take 2 tablets by mouth starting the day after chemotherapy for 3 days. Take with food., Disp: 30 tablet, Rfl: 1   lidocaine-prilocaine (EMLA) cream, Apply to affected area once, Disp: 30 g, Rfl: 3   ondansetron (ZOFRAN) 8 MG tablet, Take 1 tablet (8 mg total) by mouth every 8 (eight) hours as needed for nausea or vomiting. Start on the third day after chemotherapy., Disp: 30 tablet, Rfl: 1   prochlorperazine (COMPAZINE) 10 MG tablet, Take 1 tablet (10 mg total) by mouth every 6 (six) hours as needed for nausea or vomiting., Disp: 30 tablet, Rfl: 1   valsartan (DIOVAN) 160 MG tablet, TAKE ONE TABLET BY MOUTH DAILY (Patient not taking: Reported on 12/01/2021), Disp: 90 tablet, Rfl: 1  Allergies:  Allergies  Allergen Reactions   Fluvastatin Sodium Other (See Comments)    REACTION: Myalgias    Past Medical History, Surgical history, Social history, and Family History were reviewed and updated.  Review  of Systems: Review of Systems  Constitutional: Negative.   HENT:  Negative.    Eyes: Negative.   Respiratory: Negative.    Cardiovascular: Negative.   Gastrointestinal:  Positive for abdominal pain.  Genitourinary:  Positive for pelvic pain.   Musculoskeletal: Negative.   Skin: Negative.   Neurological: Negative.   Hematological: Negative.   Psychiatric/Behavioral: Negative.      Physical Exam:  oral temperature is 97.5 F (36.4 C) (abnormal). Her blood pressure is 105/74 and her pulse is 114 (abnormal). Her respiration is 18 and oxygen saturation is 99%.   Wt  Readings from Last 3 Encounters:  11/26/21 145 lb 1 oz (65.8 kg)  11/26/21 145 lb 1.6 oz (65.8 kg)  11/19/21 155 lb 1.9 oz (70.4 kg)    Physical Exam Vitals reviewed.  HENT:     Head: Normocephalic and atraumatic.  Eyes:     Pupils: Pupils are equal, round, and reactive to light.  Cardiovascular:     Rate and Rhythm: Normal rate and regular rhythm.     Heart sounds: Normal heart sounds.  Pulmonary:     Effort: Pulmonary effort is normal.     Breath sounds: Normal breath sounds.  Abdominal:     General: Bowel sounds are normal.     Palpations: Abdomen is soft.  Musculoskeletal:        General: No tenderness or deformity. Normal range of motion.     Cervical back: Normal range of motion.  Lymphadenopathy:     Cervical: No cervical adenopathy.  Skin:    General: Skin is warm and dry.     Findings: No erythema or rash.  Neurological:     Mental Status: She is alert and oriented to person, place, and time.  Psychiatric:        Behavior: Behavior normal.        Thought Content: Thought content normal.        Judgment: Judgment normal.      Lab Results  Component Value Date   WBC 11.5 (H) 11/26/2021   HGB 15.4 (H) 11/26/2021   HCT 46.6 (H) 11/26/2021   MCV 91.2 11/26/2021   PLT 519 (H) 11/26/2021     Chemistry      Component Value Date/Time   NA 134 (L) 11/19/2021 1510   K 4.5 11/19/2021 1510   CL 102 11/19/2021 1510   CO2 19 (L) 11/19/2021 1510   BUN 40 (H) 11/19/2021 1510   CREATININE 1.81 (H) 11/19/2021 1510   CREATININE 0.93 11/03/2021 0000      Component Value Date/Time   CALCIUM 9.6 11/19/2021 1510   ALKPHOS 80 11/19/2021 1510   AST 18 11/19/2021 1510   ALT 13 11/19/2021 1510   BILITOT 0.5 11/19/2021 1510      Impression and Plan: Ms. Maria Romero is a very charming 71 year old white female.  Looks like she has what I would consider to be stage IIIC ovarian cancer.  She has a large right ovarian mass.  I totally agree that neoadjuvant chemotherapy would  be reasonable.  Again I think she would be able to tolerate this.  She has a Port-A-Cath in already.  I know that our chemotherapy education nurse, JoEllen, went over the side effects of treatment.  I do think that Ms. Castellanos will be able to manage this.  I will give her 3 cycles of treatment.  We have then really stage her.  Hopefully we will see a very nice response.  I would think that  the CA 125 will tell us if she is responding.  We will try to get started with treatment next week.  We will try to keep her on schedule and try to keep the doses as standard as possible.  I will plan to see her back when we have her second cycle of treatment in September.     Volanda Napoleon, MD 8/23/20234:48 PM

## 2021-12-02 ENCOUNTER — Encounter: Payer: Self-pay | Admitting: Hematology & Oncology

## 2021-12-02 ENCOUNTER — Other Ambulatory Visit: Payer: Self-pay

## 2021-12-02 ENCOUNTER — Encounter: Payer: Self-pay | Admitting: *Deleted

## 2021-12-02 NOTE — Progress Notes (Signed)
Chemo protocol authorized. Patient scheduled to initiate treatment on 12/07/2021.  Called and spoke to patient. She is aware of appointment date, times and location. Answered a few follow up questions about her treatment plan. She is encouraged to call if any other questions or concerns come up prior to Tuesday.   Oncology Nurse Navigator Documentation     12/02/2021   12:30 PM  Oncology Nurse Navigator Flowsheets  Planned Course of Treatment Chemotherapy  Phase of Treatment Chemo  Navigator Follow Up Date: 12/07/2021  Navigator Follow Up Reason: Chemotherapy  Navigator Location [facility]  Navigator Encounter Type Telephone;Appt/Treatment Plan Review  Telephone Appt Confirmation/Clarification;Education;Outgoing Call  Patient Visit Type MedOnc  Treatment Phase Pre-Tx/Tx Discussion  Barriers/Navigation Needs Coordination of Care;Education  Education Other  Interventions Coordination of Care;Education;Psycho-Social Support  Acuity Level 2-Minimal Needs (1-2 Barriers Identified)  Coordination of Care Appts  Education Method Verbal  Support Groups/Services Friends and Family  Time Spent with Patient 30

## 2021-12-02 NOTE — Progress Notes (Signed)
Pharmacist Chemotherapy Monitoring - Initial Assessment    Anticipated start date: 12/07/21   The following has been reviewed per standard work regarding the patient's treatment regimen: The patient's diagnosis, treatment plan and drug doses, and organ/hematologic function Lab orders and baseline tests specific to treatment regimen  The treatment plan start date, drug sequencing, and pre-medications Prior authorization status  Patient's documented medication list, including drug-drug interaction screen and prescriptions for anti-emetics and supportive care specific to the treatment regimen The drug concentrations, fluid compatibility, administration routes, and timing of the medications to be used The patient's access for treatment and lifetime cumulative dose history, if applicable  The patient's medication allergies and previous infusion related reactions, if applicable   Changes made to treatment plan:  N/A  Follow up needed:  N/A   Maria Romero, [facility], 12/02/2021  1:52 PM

## 2021-12-03 ENCOUNTER — Inpatient Hospital Stay: Payer: Medicare Other

## 2021-12-03 NOTE — Progress Notes (Signed)
[place] Work  Initial Assessment   Maria Romero is a 71 y.o. year old female contacted by phone. Clinical Social Work was referred by nurse navigator for assessment of psychosocial needs.   SDOH (Social Determinants of Health) assessments performed: No   SDOH Screenings   Alcohol Screen: Low Risk  (03/26/2021)   Alcohol Screen    Last Alcohol Screening Score (AUDIT): 4  Depression (PHQ2-9): Low Risk  (11/15/2021)   Depression (PHQ2-9)    PHQ-2 Score: 0  Financial Resource Strain: Low Risk  (03/26/2021)   Overall Financial Resource Strain (CARDIA)    Difficulty of Paying Living Expenses: Not hard at all  Food Insecurity: No Food Insecurity (03/26/2021)   Hunger Vital Sign    Worried About Running Out of Food in the Last Year: Never true    Ran Out of Food in the Last Year: Never true  Housing: Low Risk  (03/26/2021)   Housing    Last Housing Risk Score: 0  Physical Activity: Inactive (03/26/2021)   Exercise Vital Sign    Days of Exercise per Week: 0 days    Minutes of Exercise per Session: 0 min  Social Connections: Socially Isolated (03/26/2021)   Social Connection and Isolation Panel [NHANES]    Frequency of Communication with Friends and Family: More than three times a week    Frequency of Social Gatherings with Friends and Family: Three times a week    Attends Religious Services: Never    Active Member of Clubs or Organizations: No    Attends Archivist Meetings: Never    Marital Status: Widowed  Stress: No Stress Concern Present (03/26/2021)   [place]    Feeling of Stress : Not at all  Tobacco Use: Medium Risk (12/01/2021)   Patient History    Smoking Tobacco Use: Former    Smokeless Tobacco Use: Never    Passive Exposure: Not on file  Transportation Needs: No Transportation Needs (03/26/2021)   PRAPARE - Hydrologist (Medical): No    Lack of  Transportation (Non-Medical): No     Distress Screen completed: No     No data to display            Family/Social Information:  Housing Arrangement: patient lives alone Family members/support persons in your life? Family and Friends.  Her daughter and husband life five minutes from her.  Her grandson and granddaughter also are supportive. Transportation concerns: no  Employment: Retired  Income source: Paediatric nurse concerns: No Type of concern: None Food access concerns: no Religious or spiritual practice: Not known Services Currently in place:  [facility] Medicare  Coping/ Adjustment to diagnosis: Patient understands treatment plan and what happens next? yes Concerns about diagnosis and/or treatment: Quality of life.  Patients stated she remains short of breath.  She is unable to engage in activities she used to do. Patient reported stressors: Adjusting to my illness Hopes and/or priorities: Hope is to regain energy. Patient enjoys gardening Current coping skills/ strengths: Capable of independent living , Communication skills , Scientist, research (life sciences) , General fund of knowledge , and Supportive family/friends     SUMMARY: Current SDOH Barriers:  None  Clinical Social Work Clinical Goal(s):  Patient will work with CSW to address needs related to adjusting to her illness.  Interventions: Discussed common feeling and emotions when being diagnosed with cancer, and the importance of support during treatment Informed patient  of the support team roles and support services at [facility] Provided [place] contact information and encouraged patient to call with any questions or concerns Provided education regarding SW role, [facility] and [place] [facility].   Follow Up Plan: Patient will contact CSW with any support or resource needs Patient verbalizes understanding of plan: Yes    Rodman Pickle Fouad Taul, LCSW

## 2021-12-07 ENCOUNTER — Inpatient Hospital Stay: Payer: Medicare Other

## 2021-12-07 ENCOUNTER — Encounter: Payer: Self-pay | Admitting: Hematology & Oncology

## 2021-12-07 ENCOUNTER — Inpatient Hospital Stay (HOSPITAL_BASED_OUTPATIENT_CLINIC_OR_DEPARTMENT_OTHER): Payer: Medicare Other | Admitting: Hematology & Oncology

## 2021-12-07 ENCOUNTER — Encounter: Payer: Self-pay | Admitting: *Deleted

## 2021-12-07 ENCOUNTER — Ambulatory Visit (HOSPITAL_BASED_OUTPATIENT_CLINIC_OR_DEPARTMENT_OTHER)
Admission: RE | Admit: 2021-12-07 | Discharge: 2021-12-07 | Disposition: A | Discharge status: Home / Self Care | Payer: Medicare Other | Source: Ambulatory Visit | Attending: Hematology & Oncology | Admitting: Hematology & Oncology

## 2021-12-07 VITALS — BP 100/74 | HR 120 | Temp 97.4°F | Resp 18

## 2021-12-07 VITALS — BP 94/71 | HR 105 | Temp 97.9°F | Resp 17

## 2021-12-07 DIAGNOSIS — R180 Malignant ascites: Secondary | ICD-10-CM | POA: Insufficient documentation

## 2021-12-07 DIAGNOSIS — J90 Pleural effusion, not elsewhere classified: Secondary | ICD-10-CM | POA: Diagnosis not present

## 2021-12-07 DIAGNOSIS — R6881 Early satiety: Secondary | ICD-10-CM | POA: Diagnosis not present

## 2021-12-07 DIAGNOSIS — M810 Age-related osteoporosis without current pathological fracture: Secondary | ICD-10-CM | POA: Diagnosis not present

## 2021-12-07 DIAGNOSIS — C786 Secondary malignant neoplasm of retroperitoneum and peritoneum: Secondary | ICD-10-CM | POA: Diagnosis not present

## 2021-12-07 DIAGNOSIS — I10 Essential (primary) hypertension: Secondary | ICD-10-CM | POA: Diagnosis not present

## 2021-12-07 DIAGNOSIS — R188 Other ascites: Secondary | ICD-10-CM | POA: Diagnosis not present

## 2021-12-07 DIAGNOSIS — Z5111 Encounter for antineoplastic chemotherapy: Secondary | ICD-10-CM | POA: Diagnosis not present

## 2021-12-07 DIAGNOSIS — C561 Malignant neoplasm of right ovary: Secondary | ICD-10-CM

## 2021-12-07 DIAGNOSIS — K580 Irritable bowel syndrome with diarrhea: Secondary | ICD-10-CM | POA: Diagnosis not present

## 2021-12-07 DIAGNOSIS — R140 Abdominal distension (gaseous): Secondary | ICD-10-CM | POA: Diagnosis not present

## 2021-12-07 DIAGNOSIS — R971 Elevated cancer antigen 125 [CA 125]: Secondary | ICD-10-CM | POA: Diagnosis not present

## 2021-12-07 DIAGNOSIS — E7800 Pure hypercholesterolemia, unspecified: Secondary | ICD-10-CM | POA: Diagnosis not present

## 2021-12-07 DIAGNOSIS — Z803 Family history of malignant neoplasm of breast: Secondary | ICD-10-CM | POA: Diagnosis not present

## 2021-12-07 DIAGNOSIS — Z79899 Other long term (current) drug therapy: Secondary | ICD-10-CM | POA: Diagnosis not present

## 2021-12-07 DIAGNOSIS — K669 Disorder of peritoneum, unspecified: Secondary | ICD-10-CM | POA: Diagnosis not present

## 2021-12-07 DIAGNOSIS — Z7982 Long term (current) use of aspirin: Secondary | ICD-10-CM | POA: Diagnosis not present

## 2021-12-07 DIAGNOSIS — R0602 Shortness of breath: Secondary | ICD-10-CM | POA: Diagnosis not present

## 2021-12-07 DIAGNOSIS — R978 Other abnormal tumor markers: Secondary | ICD-10-CM | POA: Diagnosis not present

## 2021-12-07 DIAGNOSIS — Z87891 Personal history of nicotine dependence: Secondary | ICD-10-CM | POA: Diagnosis not present

## 2021-12-07 DIAGNOSIS — R109 Unspecified abdominal pain: Secondary | ICD-10-CM | POA: Diagnosis not present

## 2021-12-07 LAB — CBC WITH DIFFERENTIAL (CANCER CENTER ONLY)
Abs Immature Granulocytes: 0.08 10*3/uL — ABNORMAL HIGH (ref 0.00–0.07)
Basophils Absolute: 0.1 10*3/uL (ref 0.0–0.1)
Basophils Relative: 0 %
Eosinophils Absolute: 0.1 10*3/uL (ref 0.0–0.5)
Eosinophils Relative: 1 %
HCT: 36.9 % (ref 36.0–46.0)
Hemoglobin: 12.6 g/dL (ref 12.0–15.0)
Immature Granulocytes: 1 %
Lymphocytes Relative: 7 %
Lymphs Abs: 1.1 10*3/uL (ref 0.7–4.0)
MCH: 29.7 pg (ref 26.0–34.0)
MCHC: 34.1 g/dL (ref 30.0–36.0)
MCV: 87 fL (ref 80.0–100.0)
Monocytes Absolute: 1 10*3/uL (ref 0.1–1.0)
Monocytes Relative: 7 %
Neutro Abs: 12.2 10*3/uL — ABNORMAL HIGH (ref 1.7–7.7)
Neutrophils Relative %: 84 %
Platelet Count: 542 10*3/uL — ABNORMAL HIGH (ref 150–400)
RBC: 4.24 MIL/uL (ref 3.87–5.11)
RDW: 13.2 % (ref 11.5–15.5)
WBC Count: 14.5 10*3/uL — ABNORMAL HIGH (ref 4.0–10.5)
nRBC: 0 % (ref 0.0–0.2)

## 2021-12-07 LAB — CMP (CANCER CENTER ONLY)
ALT: 26 U/L (ref 0–44)
AST: 37 U/L (ref 15–41)
Albumin: 2.9 g/dL — ABNORMAL LOW (ref 3.5–5.0)
Alkaline Phosphatase: 256 U/L — ABNORMAL HIGH (ref 38–126)
Anion gap: 12 (ref 5–15)
BUN: 44 mg/dL — ABNORMAL HIGH (ref 8–23)
CO2: 19 mmol/L — ABNORMAL LOW (ref 22–32)
Calcium: 9.2 mg/dL (ref 8.9–10.3)
Chloride: 96 mmol/L — ABNORMAL LOW (ref 98–111)
Creatinine: 1.31 mg/dL — ABNORMAL HIGH (ref 0.44–1.00)
GFR, Estimated: 43 mL/min — ABNORMAL LOW (ref >60)
Glucose, Bld: 146 mg/dL — ABNORMAL HIGH (ref 70–99)
Potassium: 4.8 mmol/L (ref 3.5–5.1)
Sodium: 127 mmol/L — ABNORMAL LOW (ref 135–145)
Total Bilirubin: 0.5 mg/dL (ref 0.3–1.2)
Total Protein: 6.6 g/dL (ref 6.5–8.1)

## 2021-12-07 MED ORDER — PALONOSETRON HCL INJECTION 0.25 MG/5ML
0.2500 mg | Freq: Once | INTRAVENOUS | Status: AC
  Administered 2021-12-07: 0.25 mg via INTRAVENOUS
  Filled 2021-12-07: qty 5

## 2021-12-07 MED ORDER — SODIUM CHLORIDE 0.9 % IV SOLN
150.0000 mg | Freq: Once | INTRAVENOUS | Status: AC
  Administered 2021-12-07: 150 mg via INTRAVENOUS
  Filled 2021-12-07: qty 150

## 2021-12-07 MED ORDER — DIPHENHYDRAMINE HCL 50 MG/ML IJ SOLN
50.0000 mg | Freq: Once | INTRAMUSCULAR | Status: AC
  Administered 2021-12-07: 50 mg via INTRAVENOUS
  Filled 2021-12-07: qty 1

## 2021-12-07 MED ORDER — SODIUM CHLORIDE 0.9 % IV SOLN: Freq: Once | INTRAVENOUS | Status: AC

## 2021-12-07 MED ORDER — SODIUM CHLORIDE 0.9 % IV SOLN
175.0000 mg/m2 | Freq: Once | INTRAVENOUS | Status: AC
  Administered 2021-12-07: 300 mg via INTRAVENOUS
  Filled 2021-12-07: qty 50

## 2021-12-07 MED ORDER — SODIUM CHLORIDE 0.9% FLUSH
10.0000 mL | INTRAVENOUS | Status: DC | PRN
  Administered 2021-12-07: 10 mL

## 2021-12-07 MED ORDER — HEPARIN SOD (PORK) LOCK FLUSH 100 UNIT/ML IV SOLN
500.0000 [IU] | Freq: Once | INTRAVENOUS | Status: AC | PRN
  Administered 2021-12-07: 500 [IU]

## 2021-12-07 MED ORDER — SODIUM CHLORIDE 0.9 % IV SOLN
391.8000 mg | Freq: Once | INTRAVENOUS | Status: AC
  Administered 2021-12-07: 390 mg via INTRAVENOUS
  Filled 2021-12-07: qty 39

## 2021-12-07 MED ORDER — FAMOTIDINE IN NACL 20-0.9 MG/50ML-% IV SOLN
20.0000 mg | Freq: Once | INTRAVENOUS | Status: AC
  Administered 2021-12-07: 20 mg via INTRAVENOUS
  Filled 2021-12-07: qty 50

## 2021-12-07 MED ORDER — SODIUM CHLORIDE 0.9 % IV SOLN
10.0000 mg | Freq: Once | INTRAVENOUS | Status: AC
  Administered 2021-12-07: 10 mg via INTRAVENOUS
  Filled 2021-12-07: qty 10

## 2021-12-07 NOTE — Progress Notes (Signed)
OK to treat with HR-109 per order of Dr. Marin Olp.

## 2021-12-07 NOTE — Progress Notes (Signed)
Hematology and Oncology Follow Up Visit  Maria Romero [id number] [birth year] 71 y.o. 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  She was supposed to start chemotherapy today.  She is feeling short of breath.  We did do a chest x-ray on her today.  This did not show some small pleural effusions.  There is more on the left side than on the right side.  She had a lot of ascites.  Again have to get the ascites taken off.  We can try to get this done tomorrow.  She has had no pain.  There is been no nausea or vomiting.  She has had no diarrhea.  There may be some bowel irregularity.  There is been no bleeding.  She has had no real leg swelling.  We really have to get started with treatment.  I think we get started with treatment today.  Hopefully this will start to help with the ascites regression.  She has had no fever.  Overall, I would say performance status is probably ECOG 1-2.    Medications:  Current Outpatient Medications:    aspirin 81 MG EC tablet, Take 81 mg by mouth daily., Disp: , Rfl:    atorvastatin (LIPITOR) 40 MG tablet, Take 1 tablet (40 mg total) by mouth at bedtime., Disp: 90 tablet, Rfl: 3   Calcium Carb-Cholecalciferol (CALCIUM 1000 + D PO), Take 1 tablet by mouth daily., Disp: , Rfl:    cholecalciferol (VITAMIN D3) 25 MCG (1000 UNIT) tablet, Take 1,000 Units by mouth daily., Disp: , Rfl:    dexamethasone (DECADRON) 4 MG tablet, Take 2 tablets by mouth starting the day after chemotherapy for 3 days. Take with food., Disp: 30 tablet, Rfl: 1   lidocaine-prilocaine (EMLA) cream, Apply to affected area once, Disp: 30 g, Rfl: 3   Omega-3 Fatty Acids (FISH OIL MAXIMUM STRENGTH) 1200 MG CAPS, Take 1 capsule by mouth 2 (two) times daily., Disp: , Rfl:     omeprazole (PRILOSEC) 10 MG capsule, Take by mouth daily. Unsure dose**, Disp: , Rfl:    ondansetron (ZOFRAN) 8 MG tablet, Take 1 tablet (8 mg total) by mouth every 8 (eight) hours as needed for nausea or vomiting. Start on the third day after chemotherapy., Disp: 30 tablet, Rfl: 1   prochlorperazine (COMPAZINE) 10 MG tablet, Take 1 tablet (10 mg total) by mouth every 6 (six) hours as needed for nausea or vomiting., Disp: 30 tablet, Rfl: 1   amLODipine (NORVASC) 5 MG tablet, TAKE ONE TABLET BY MOUTH DAILY (Patient not taking: Reported on 12/01/2021), Disp: 90 tablet, Rfl: 1   valsartan (DIOVAN) 160 MG tablet, TAKE ONE TABLET BY MOUTH DAILY (Patient not taking: Reported on 12/01/2021), Disp: 90 tablet, Rfl: 1 No current facility-administered medications for this visit.  Facility-Administered Medications Ordered in Other Visits:    CARBOplatin (PARAPLATIN) 390 mg in sodium chloride 0.9 % 100 mL chemo infusion, 390 mg, Intravenous, Once, Samira Acero, Rudell Cobb, MD   heparin lock flush 100 unit/mL, 500 Units, Intracatheter, Once PRN, Marin Olp, Rudell Cobb, MD   PACLitaxel (TAXOL) 300 mg in sodium chloride 0.9 % 250 mL chemo infusion (> '80mg'$ /m2), 175 mg/m2 (Treatment Plan Recorded), Intravenous, Once, Volanda Napoleon, MD, Last Rate: 50 mL/hr at 12/07/21 1053, Rate Change at 12/07/21 1053  sodium chloride flush (NS) 0.9 % injection 10 mL, 10 mL, Intracatheter, PRN, Volanda Napoleon, MD  Allergies:  Allergies  Allergen Reactions   Fluvastatin Sodium Other (See Comments)    REACTION: Myalgias    Past Medical History, Surgical history, Social history, and Family History were reviewed and updated.  Review of Systems: Review of Systems  Constitutional: Negative.   HENT:  Negative.    Eyes: Negative.   Respiratory: Negative.    Cardiovascular: Negative.   Gastrointestinal:  Positive for abdominal pain.  Genitourinary:  Positive for pelvic pain.   Musculoskeletal: Negative.   Skin: Negative.   Neurological:  Negative.   Hematological: Negative.   Psychiatric/Behavioral: Negative.      Physical Exam:  oral temperature is 97.4 F (36.3 C) (abnormal). Her blood pressure is 100/74 and her pulse is 120 (abnormal). Her respiration is 18 and oxygen saturation is 97%.   Wt Readings from Last 3 Encounters:  11/26/21 145 lb 1 oz (65.8 kg)  11/26/21 145 lb 1.6 oz (65.8 kg)  11/19/21 155 lb 1.9 oz (70.4 kg)    Physical Exam Vitals reviewed.  HENT:     Head: Normocephalic and atraumatic.  Eyes:     Pupils: Pupils are equal, round, and reactive to light.  Cardiovascular:     Rate and Rhythm: Normal rate and regular rhythm.     Heart sounds: Normal heart sounds.  Pulmonary:     Effort: Pulmonary effort is normal.     Breath sounds: Normal breath sounds.  Abdominal:     General: Bowel sounds are normal.     Palpations: Abdomen is soft.  Musculoskeletal:        General: No tenderness or deformity. Normal range of motion.     Cervical back: Normal range of motion.  Lymphadenopathy:     Cervical: No cervical adenopathy.  Skin:    General: Skin is warm and dry.     Findings: No erythema or rash.  Neurological:     Mental Status: She is alert and oriented to person, place, and time.  Psychiatric:        Behavior: Behavior normal.        Thought Content: Thought content normal.        Judgment: Judgment normal.      Lab Results  Component Value Date   WBC 14.5 (H) 12/07/2021   HGB 12.6 12/07/2021   HCT 36.9 12/07/2021   MCV 87.0 12/07/2021   PLT 542 (H) 12/07/2021     Chemistry      Component Value Date/Time   NA 127 (L) 12/07/2021 0808   K 4.8 12/07/2021 0808   CL 96 (L) 12/07/2021 0808   CO2 19 (L) 12/07/2021 0808   BUN 44 (H) 12/07/2021 0808   CREATININE 1.31 (H) 12/07/2021 0808   CREATININE 0.93 11/03/2021 0000      Component Value Date/Time   CALCIUM 9.2 12/07/2021 0808   ALKPHOS 256 (H) 12/07/2021 0808   AST 37 12/07/2021 0808   ALT 26 12/07/2021 0808   BILITOT 0.5  12/07/2021 0808      Impression and Plan: Maria Romero is a very charming 71 year old white female.  Looks like she has what I would consider to be stage IIIC ovarian cancer.  She has a large right ovarian mass.  We really need to get this ascites out of her.  She will have a paracentesis tomorrow.  This should really help with what is in her lungs.  Again, I  think with the effusion in her lungs is reflective of what is in her abdomen and probably flowing up through rents in the diaphragm.  I do think that we can go ahead with chemotherapy today.  At least, we can start this and try to help with the ascites.  We will continue to follow her.  She was let us know what is going on.  If she has any issues, she will certainly let us know.    Volanda Napoleon, MD 8/29/202312:51 PM

## 2021-12-07 NOTE — Patient Instructions (Signed)
Paclitaxel Injection What is this medication? PACLITAXEL (PAK li TAX el) treats some types of cancer. It works by slowing down the growth of cancer cells. This medicine may be used for other purposes; ask your health care provider or pharmacist if you have questions. COMMON BRAND NAME(S): Onxol, Taxol What should I tell my care team before I take this medication? They need to know if you have any of these conditions: Heart disease Liver disease Low white blood cell levels An unusual or allergic reaction to paclitaxel, other medications, foods, dyes, or preservatives If you or your partner are pregnant or trying to get pregnant Breast-feeding How should I use this medication? This medication is injected into a vein. It is given by your care team in a hospital or clinic setting. Talk to your care team about the use of this medication in children. While it may be given to children for selected conditions, precautions do apply. Overdosage: If you think you have taken too much of this medicine contact a poison control center or emergency room at once. NOTE: This medicine is only for you. Do not share this medicine with others. What if I miss a dose? Keep appointments for follow-up doses. It is important not to miss your dose. Call your care team if you are unable to keep an appointment. What may interact with this medication? Do not take this medication with any of the following: Live virus vaccines Other medications may affect the way this medication works. Talk with your care team about all of the medications you take. They may suggest changes to your treatment plan to lower the risk of side effects and to make sure your medications work as intended. This list may not describe all possible interactions. Give your health care provider a list of all the medicines, herbs, non-prescription drugs, or dietary supplements you use. Also tell them if you smoke, drink alcohol, or use illegal drugs. Some  items may interact with your medicine. What should I watch for while using this medication? Your condition will be monitored carefully while you are receiving this medication. You may need blood work while taking this medication. This medication may make you feel generally unwell. This is not uncommon as chemotherapy can affect healthy cells as well as cancer cells. Report any side effects. Continue your course of treatment even though you feel ill unless your care team tells you to stop. This medication can cause serious allergic reactions. To reduce the risk, your care team may give you other medications to take before receiving this one. Be sure to follow the directions from your care team. This medication may increase your risk of getting an infection. Call your care team for advice if you get a fever, chills, sore throat, or other symptoms of a cold or flu. Do not treat yourself. Try to avoid being around people who are sick. This medication may increase your risk to bruise or bleed. Call your care team if you notice any unusual bleeding. Be careful brushing or flossing your teeth or using a toothpick because you may get an infection or bleed more easily. If you have any dental work done, tell your dentist you are receiving this medication. Talk to your care team if you may be pregnant. Serious birth defects can occur if you take this medication during pregnancy. Talk to your care team before breastfeeding. Changes to your treatment plan may be needed. What side effects may I notice from receiving this medication? Side effects that you   should report to your care team as soon as possible: Allergic reactions--skin rash, itching, hives, swelling of the face, lips, tongue, or throat Heart rhythm changes--fast or irregular heartbeat, dizziness, feeling faint or lightheaded, chest pain, trouble breathing Increase in blood pressure Infection--fever, chills, cough, sore throat, wounds that don't heal,  pain or trouble when passing urine, general feeling of discomfort or being unwell Low blood pressure--dizziness, feeling faint or lightheaded, blurry vision Low red blood cell level--unusual weakness or fatigue, dizziness, headache, trouble breathing Painful swelling, warmth, or redness of the skin, blisters or sores at the infusion site Pain, tingling, or numbness in the hands or feet Slow heartbeat--dizziness, feeling faint or lightheaded, confusion, trouble breathing, unusual weakness or fatigue Unusual bruising or bleeding Side effects that usually do not require medical attention (report to your care team if they continue or are bothersome): Diarrhea Hair loss Joint pain Loss of appetite Muscle pain Nausea Vomiting This list may not describe all possible side effects. Call your doctor for medical advice about side effects. You may report side effects to FDA at 1-800-FDA-1088. Where should I keep my medication? This medication is given in a hospital or clinic. It will not be stored at home. NOTE: This sheet is a summary. It may not cover all possible information. If you have questions about this medicine, talk to your doctor, pharmacist, or health care provider.  2023 Elsevier/Gold Standard (2021-08-12 00:00:00)  Carboplatin Injection What is this medication? CARBOPLATIN (KAR boe pla tin) treats some types of cancer. It works by slowing down the growth of cancer cells. This medicine may be used for other purposes; ask your health care provider or pharmacist if you have questions. COMMON BRAND NAME(S): Paraplatin What should I tell my care team before I take this medication? They need to know if you have any of these conditions: Blood disorders Hearing problems Kidney disease Recent or ongoing radiation therapy An unusual or allergic reaction to carboplatin, cisplatin, other medications, foods, dyes, or preservatives Pregnant or trying to get pregnant Breast-feeding How should  I use this medication? This medication is injected into a vein. It is given by your care team in a hospital or clinic setting. Talk to your care team about the use of this medication in children. Special care may be needed. Overdosage: If you think you have taken too much of this medicine contact a poison control center or emergency room at once. NOTE: This medicine is only for you. Do not share this medicine with others. What if I miss a dose? Keep appointments for follow-up doses. It is important not to miss your dose. Call your care team if you are unable to keep an appointment. What may interact with this medication? Medications for seizures Some antibiotics, such as amikacin, gentamicin, neomycin, streptomycin, tobramycin Vaccines This list may not describe all possible interactions. Give your health care provider a list of all the medicines, herbs, non-prescription drugs, or dietary supplements you use. Also tell them if you smoke, drink alcohol, or use illegal drugs. Some items may interact with your medicine. What should I watch for while using this medication? Your condition will be monitored carefully while you are receiving this medication. You may need blood work while taking this medication. This medication may make you feel generally unwell. This is not uncommon, as chemotherapy can affect healthy cells as well as cancer cells. Report any side effects. Continue your course of treatment even though you feel ill unless your care team tells you to stop.  In some cases, you may be given additional medications to help with side effects. Follow all directions for their use. This medication may increase your risk of getting an infection. Call your care team for advice if you get a fever, chills, sore throat, or other symptoms of a cold or flu. Do not treat yourself. Try to avoid being around people who are sick. Avoid taking medications that contain aspirin, acetaminophen, ibuprofen, naproxen,  or ketoprofen unless instructed by your care team. These medications may hide a fever. Be careful brushing or flossing your teeth or using a toothpick because you may get an infection or bleed more easily. If you have any dental work done, tell your dentist you are receiving this medication. Talk to your care team if you wish to become pregnant or think you might be pregnant. This medication can cause serious birth defects. Talk to your care team about effective forms of contraception. Do not breast-feed while taking this medication. What side effects may I notice from receiving this medication? Side effects that you should report to your care team as soon as possible: Allergic reactions--skin rash, itching, hives, swelling of the face, lips, tongue, or throat Infection--fever, chills, cough, sore throat, wounds that don't heal, pain or trouble when passing urine, general feeling of discomfort or being unwell Low red blood cell level--unusual weakness or fatigue, dizziness, headache, trouble breathing Pain, tingling, or numbness in the hands or feet, muscle weakness, change in vision, confusion or trouble speaking, loss of balance or coordination, trouble walking, seizures Unusual bruising or bleeding Side effects that usually do not require medical attention (report to your care team if they continue or are bothersome): Hair loss Nausea Unusual weakness or fatigue Vomiting This list may not describe all possible side effects. Call your doctor for medical advice about side effects. You may report side effects to FDA at 1-800-FDA-1088. Where should I keep my medication? This medication is given in a hospital or clinic. It will not be stored at home. NOTE: This sheet is a summary. It may not cover all possible information. If you have questions about this medicine, talk to your doctor, pharmacist, or health care provider.  2023 Elsevier/Gold Standard (2021-07-20 00:00:00)

## 2021-12-07 NOTE — Progress Notes (Signed)
Patient is here to begin treatment. She had been progressively getting more short of breath since late last week. This morning when she tried to walk into office building she became extremely short of breath and needed intervention in our lobby. She is now resting more comfortably in the treatment chair with oxygen on. She is scheduled for a paracentesis tomorrow.   She is ready to proceed with treatment today. She has her sister with her for support. Told her I would call her on Thursday to follow up with her after her first treatment and paracentesis.   Oncology Nurse Navigator Documentation     12/07/2021   10:00 AM  Oncology Nurse Navigator Flowsheets  Phase of Treatment Chemo  Chemotherapy Actual Start Date: 12/07/2021  Navigator Follow Up Date: 12/09/2021  Navigator Follow Up Reason: Patient Call  Navigator Location [facility]  Navigator Encounter Type Treatment  Treatment Initiated Date 12/07/2021  Patient Visit Type MedOnc  Treatment Phase First Chemo Tx  Barriers/Navigation Needs Coordination of Care;Education  Education Pain/ Symptom Management  Interventions Education;Psycho-Social Support  Acuity Level 2-Minimal Needs (1-2 Barriers Identified)  Education Method Verbal  Support Groups/Services Friends and Family  Time Spent with Patient 30

## 2021-12-08 ENCOUNTER — Ambulatory Visit (HOSPITAL_COMMUNITY)
Admission: RE | Admit: 2021-12-08 | Discharge: 2021-12-08 | Disposition: A | Discharge status: Home / Self Care | Payer: Medicare Other | Source: Ambulatory Visit | Attending: Hematology & Oncology | Admitting: Hematology & Oncology

## 2021-12-08 DIAGNOSIS — Z8543 Personal history of malignant neoplasm of ovary: Secondary | ICD-10-CM | POA: Insufficient documentation

## 2021-12-08 DIAGNOSIS — R180 Malignant ascites: Secondary | ICD-10-CM | POA: Insufficient documentation

## 2021-12-08 DIAGNOSIS — R188 Other ascites: Secondary | ICD-10-CM | POA: Diagnosis not present

## 2021-12-08 MED ORDER — LIDOCAINE HCL 1 % IJ SOLN
INTRAMUSCULAR | Status: AC
  Administered 2021-12-08: 10 mL
  Filled 2021-12-08: qty 20

## 2021-12-08 NOTE — Procedures (Signed)
Ultrasound-guided  therapeutic paracentesis performed yielding 4 liters of amber colored fluid. No immediate complications. EBL none.

## 2021-12-09 ENCOUNTER — Encounter: Payer: Self-pay | Admitting: *Deleted

## 2021-12-09 ENCOUNTER — Encounter (HOSPITAL_COMMUNITY): Payer: Self-pay | Admitting: Hematology & Oncology

## 2021-12-09 DIAGNOSIS — C561 Malignant neoplasm of right ovary: Secondary | ICD-10-CM

## 2021-12-09 DIAGNOSIS — R180 Malignant ascites: Secondary | ICD-10-CM

## 2021-12-09 NOTE — Progress Notes (Signed)
Called to check on patient after her first treatment on 12/07/21.  Patient is doing well. She denies any n/v or other significant symptoms. She does have some mouth dryness and tenderness. She is using Biotene. She has her antiemetics and verbalized how to use them. She has been taking her decadron appropriately.   She had her paracentesis yesterday with 4L removed. When her ascites increases she has increased shortness of breath, pain, and difficulty with ambulation. We will go ahead an order a paracentesis to be scheduled at the end of next week, as she has been requiring one about every 10 days. Order placed.   Paracentesis scheduled for 12/17/2021. Patient is aware of appointment date, time and location.   Oncology Nurse Navigator Documentation     12/09/2021    9:30 AM  Oncology Nurse Navigator Flowsheets  Navigator Follow Up Date: 12/17/2021  Navigator Follow Up Reason: Radiology  Navigator Location [facility]  Navigator Encounter Type Telephone  Telephone Patient Update;Symptom Mgt;Outgoing Call  Patient Visit Type MedOnc  Treatment Phase Active Tx  Barriers/Navigation Needs Coordination of Care;Education  Education Pain/ Symptom Management;Other  Interventions Coordination of Care;Education;Psycho-Social Support  Acuity Level 2-Minimal Needs (1-2 Barriers Identified)  Coordination of Care Radiology  Education Method Verbal;Teach-back  Support Groups/Services Friends and Family  Time Spent with Patient 68

## 2021-12-10 ENCOUNTER — Encounter: Payer: Self-pay | Admitting: *Deleted

## 2021-12-10 ENCOUNTER — Other Ambulatory Visit: Payer: Self-pay | Admitting: *Deleted

## 2021-12-10 DIAGNOSIS — K5903 Drug induced constipation: Secondary | ICD-10-CM

## 2021-12-10 MED ORDER — LACTULOSE 20 GM/30ML PO SOLN: 30.0000 mL | ORAL | 2 refills | Status: DC | PRN

## 2021-12-10 NOTE — Progress Notes (Signed)
Called patient to check on her progress. She has passed some gas and some small "pellets". She is going to take benefiber again this evening. Encouraged her to increase her water intake.   Since we are going into a long holiday weekend and patient is feeling poorly, we proactively sent in a prescription for lactulose. If the benefiber is ineffective and patient feels like her symptoms are worsening, she will get the lactulose.   Oncology Nurse Navigator Documentation     12/10/2021    3:30 PM  Oncology Nurse Navigator Flowsheets  Navigator Follow Up Date: 12/17/2021  Navigator Follow Up Reason: Radiology  Navigator Location [facility]  Navigator Encounter Type Telephone  Telephone Outgoing Call  Patient Visit Type MedOnc  Treatment Phase Active Tx  Barriers/Navigation Needs Coordination of Care;Education  Education Pain/ Symptom Management;Other  Interventions Education;Psycho-Social Support  Acuity Level 2-Minimal Needs (1-2 Barriers Identified)  Education Method Verbal;Teach-back  Support Groups/Services Friends and Family  Time Spent with Patient 15

## 2021-12-10 NOTE — Progress Notes (Signed)
Patient calling with c/o bloating, abdominal pain, constipation and difficulty sleeping last pm. She states that over night she felt generalized pain throughout her body. She feels largely bloated and her abdomina pain is increased. She feels like it's related to her not having a BM since Monday. She is not taking anything for constipation or pain.   Instructed her to start Miralax to treat the constipation, and see if this resolved her other symptoms. She doesn't want to leave her home, and she only has benefiber. She asks if it's okay to just use that. Instructed her that she could use benefiber, but that if that was ineffective, Miralax would be the next step. She agreed. She will start the benefiber this morning.

## 2021-12-11 ENCOUNTER — Ambulatory Visit
Admission: RE | Admit: 2021-12-11 | Discharge: 2021-12-11 | Disposition: A | Discharge status: Home / Self Care | Payer: Medicare Other | Source: Ambulatory Visit | Attending: Family Medicine | Admitting: Family Medicine

## 2021-12-11 VITALS — BP 96/64 | HR 115 | Temp 97.5°F | Resp 22 | Ht 63.0 in

## 2021-12-11 DIAGNOSIS — Z9889 Other specified postprocedural states: Secondary | ICD-10-CM

## 2021-12-11 DIAGNOSIS — C561 Malignant neoplasm of right ovary: Secondary | ICD-10-CM

## 2021-12-11 DIAGNOSIS — Z5189 Encounter for other specified aftercare: Secondary | ICD-10-CM

## 2021-12-11 NOTE — ED Provider Notes (Signed)
[facility]    CSN: [id number] Arrival date & time: 12/11/21  0944      History   Chief Complaint Chief Complaint  Patient presents with   Wound Check    HPI Maria Romero is a 71 y.o. female.   HPI  Very pleasant 71 year old woman here with her daughter.  She was diagnosed with metastatic ovarian cancer has had 1 course of chemo.  She has significant cirrhosis and has had paracentesis done repeatedly.  After her last paracentesis (done 12/09/2021) she has developed an ascites fluid leak.  She is keeping a bandage on it.  It soaks frequently.  She is here for treatment No fever or chills.  No malaise.  No increased abdominal pain  Recent chart and oncology note reviewed  Past Medical History:  Diagnosis Date   Goals of care, counseling/discussion 12/01/2021   Hypercholesterolemia    Hypertension    IBS (irritable bowel syndrome)    Malignant ascites 11/19/2021   Osteopenia    Osteoporosis    [name], right ([place]) 12/01/2021   Ovarian mass, right 11/19/2021    Patient Active Problem List   Diagnosis Date Noted   [name], right ([place]) 12/01/2021   Goals of care, counseling/discussion 12/01/2021   Malignant ascites 11/19/2021   Abdominal pain 11/08/2021   Osteoporosis 05/05/2021   Essential hypertension 09/16/2015   Raynaud disease 02/09/2015   Bradycardia 08/25/2009   PALPITATIONS 08/25/2009   DYSPNEA 08/25/2009   Abnormal levels of other serum enzymes 11/13/2007   Disorder of bone and cartilage 05/22/2007   HYPERCHOLESTEROLEMIA 05/21/2007   IBS 05/21/2007   LEG PAIN, LEFT 05/21/2007    Past Surgical History:  Procedure Laterality Date   IR IMAGING GUIDED PORT INSERTION  11/26/2021   IR PARACENTESIS  11/23/2021   IR PARACENTESIS  11/26/2021   lump removal  04/11/1993   axilla right - benign   TONSILLECTOMY     TUBAL LIGATION  04/11/1988    OB History     Gravida  1   Para  1   Term  1   Preterm      AB      Living  1       SAB      IAB      Ectopic      Multiple      Live Births               Home Medications    Prior to Admission medications   Medication Sig Start Date End Date Taking? Authorizing Provider  atorvastatin (LIPITOR) 40 MG tablet Take 1 tablet (40 mg total) by mouth at bedtime. 11/05/20   Hali Marry, MD  Calcium Carb-Cholecalciferol (CALCIUM 1000 + D PO) Take 1 tablet by mouth daily.    [provider]  cholecalciferol (VITAMIN D3) 25 MCG (1000 UNIT) tablet Take 1,000 Units by mouth daily.    [provider]  dexamethasone (DECADRON) 4 MG tablet Take 2 tablets by mouth starting the day after chemotherapy for 3 days. Take with food. 12/01/21   Volanda Napoleon, MD  Lactulose 20 GM/30ML SOLN Take 30 mLs (20 g total) by mouth every 4 (four) hours as needed. Take every 4hrs until BM. 12/10/21   Volanda Napoleon, MD  lidocaine-prilocaine (EMLA) cream Apply to affected area once 12/01/21   Ennever, Rudell Cobb, MD  Omega-3 Fatty Acids (FISH OIL MAXIMUM STRENGTH) 1200 MG CAPS Take 1 capsule by mouth 2 (two)  times daily.    [provider]  omeprazole (PRILOSEC) 10 MG capsule Take by mouth daily. Unsure dose**    [provider]  ondansetron (ZOFRAN) 8 MG tablet Take 1 tablet (8 mg total) by mouth every 8 (eight) hours as needed for nausea or vomiting. Start on the third day after chemotherapy. 12/01/21   Volanda Napoleon, MD  prochlorperazine (COMPAZINE) 10 MG tablet Take 1 tablet (10 mg total) by mouth every 6 (six) hours as needed for nausea or vomiting. 12/01/21   Volanda Napoleon, MD    Family History Family History  Problem Relation Age of Onset   Stroke Mother    Hypertension Mother    Hyperlipidemia Mother    Glaucoma Mother    Heart attack Father 66   Diabetes Father    Hypertension Father    Hyperlipidemia Father    Cataracts Father    Breast cancer Cousin    Colon cancer Neg Hx    Ovarian cancer Neg Hx    Endometrial cancer Neg Hx     Pancreatic cancer Neg Hx    Prostate cancer Neg Hx     Social History Social History   Tobacco Use   Smoking status: Former    Types: Cigarettes    Quit date: 03/20/1990    Years since quitting: 31.7   Smokeless tobacco: Never   Tobacco comments:    quit 1988  Vaping Use   Vaping Use: Never used  Substance Use Topics   Alcohol use: Not Currently    Alcohol/week: 2.0 standard drinks of alcohol    Types: 2 Glasses of wine per week    Comment: Wine in the evenings   Drug use: No     Allergies   Fluvastatin sodium   Review of Systems Review of Systems  See HPI Physical Exam Triage Vital Signs ED Triage Vitals  Enc Vitals Group     BP 12/11/21 1002 96/64     Pulse Rate 12/11/21 1002 (!) 115     Resp 12/11/21 1002 (!) 22     Temp 12/11/21 1002 (!) 97.5 F (36.4 C)     Temp Source 12/11/21 1002 Oral     SpO2 12/11/21 1002 94 %     Weight --      Height 12/11/21 1005 '5\' 3"'$  (1.6 m)     Head Circumference --      Peak Flow --      Pain Score 12/11/21 1005 3     Pain Loc --      Pain Edu? --      Excl. in [place]? --    No data found.  Updated Vital Signs BP 96/64 (BP Location: Left Arm)   Pulse (!) 115   Temp (!) 97.5 F (36.4 C) (Oral)   Resp (!) 22   Ht '5\' 3"'$  (1.6 m)   SpO2 94%   BMI 25.70 kg/m   Physical Exam Constitutional:      General: She is not in acute distress.    Appearance: Normal appearance. She is well-developed. She is ill-appearing.     Comments: Frail and ill in appearance  HENT:     Head: Normocephalic and atraumatic.  Eyes:     Conjunctiva/sclera: Conjunctivae normal.     Pupils: Pupils are equal, round, and reactive to light.  Cardiovascular:     Rate and Rhythm: Normal rate.  Pulmonary:     Effort: Pulmonary effort is normal. No respiratory distress.  Abdominal:  General: There is distension.     Palpations: Abdomen is soft.     Tenderness: There is abdominal tenderness.    Musculoskeletal:        General: Normal range  of motion.     Cervical back: Normal range of motion.  Skin:    General: Skin is warm and dry.  Neurological:     Mental Status: She is alert.      UC Treatments / Results  Labs (all labs ordered are listed, but only abnormal results are displayed) Labs Reviewed - No data to display  EKG   Radiology No results found.  Procedures The area is cleansed with Betadine.  Allowed to dry. Betadine is painted liberally on the surrounding abdomen A sterile piece of gauze is wadded and placed over the puncture Tegaderm is stretched and placed over the gauze to patch area Wound care discusse    Medications Ordered in UC Medications - No data to display  Initial Impression / Assessment and Plan / UC Course  I have reviewed the triage vital signs and the nursing notes.  Pertinent labs & imaging results that were available during my care of the patient were reviewed by me and considered in my medical decision making (see chart for details).      Final Clinical Impressions(s) / UC Diagnoses   Final diagnoses:  History of abdominal paracentesis  Visit for wound check  [name], right [facility])     Discharge Instructions      Leave dressing on for 48 hours If you develop increasing pain or fever, go to ER Call Oncologist for advice and follow up    ED Prescriptions   None    PDMP not reviewed this encounter.   Raylene Everts, MD 12/11/21 1056

## 2021-12-11 NOTE — Discharge Instructions (Signed)
Leave dressing on for 48 hours If you develop increasing pain or fever, go to ER Call Oncologist for advice and follow up

## 2021-12-11 NOTE — ED Triage Notes (Signed)
Fluid draining from paracentesis site  (done 12/09/21) Here for dressing change  Here w/ her daughter

## 2021-12-12 ENCOUNTER — Telehealth: Payer: Self-pay | Admitting: Emergency Medicine

## 2021-12-12 NOTE — Telephone Encounter (Signed)
Call to see how Maria Romero was doing today. Dressing intact, not leaking per pt. Will call oncologist on Tuesday or sooner if she becomes more uncomfortable or more SOB. No questions or concerns about the visit

## 2021-12-14 ENCOUNTER — Other Ambulatory Visit: Payer: Self-pay

## 2021-12-14 ENCOUNTER — Other Ambulatory Visit: Payer: Self-pay | Admitting: Hematology & Oncology

## 2021-12-14 ENCOUNTER — Inpatient Hospital Stay: Payer: Medicare Other

## 2021-12-14 ENCOUNTER — Inpatient Hospital Stay: Payer: Medicare Other | Attending: Hematology & Oncology

## 2021-12-14 ENCOUNTER — Telehealth: Payer: Self-pay

## 2021-12-14 ENCOUNTER — Encounter: Payer: Self-pay | Admitting: *Deleted

## 2021-12-14 VITALS — BP 93/69 | HR 100 | Temp 97.2°F | Resp 22

## 2021-12-14 DIAGNOSIS — T451X5A Adverse effect of antineoplastic and immunosuppressive drugs, initial encounter: Secondary | ICD-10-CM | POA: Diagnosis not present

## 2021-12-14 DIAGNOSIS — D72829 Elevated white blood cell count, unspecified: Secondary | ICD-10-CM | POA: Diagnosis not present

## 2021-12-14 DIAGNOSIS — I10 Essential (primary) hypertension: Secondary | ICD-10-CM | POA: Insufficient documentation

## 2021-12-14 DIAGNOSIS — E860 Dehydration: Secondary | ICD-10-CM | POA: Diagnosis not present

## 2021-12-14 DIAGNOSIS — D61810 Antineoplastic chemotherapy induced pancytopenia: Secondary | ICD-10-CM | POA: Diagnosis not present

## 2021-12-14 DIAGNOSIS — C561 Malignant neoplasm of right ovary: Secondary | ICD-10-CM

## 2021-12-14 DIAGNOSIS — R8569 Abnormal cytological findings in specimens from other digestive organs and abdominal cavity: Secondary | ICD-10-CM | POA: Diagnosis not present

## 2021-12-14 DIAGNOSIS — R0602 Shortness of breath: Secondary | ICD-10-CM | POA: Diagnosis not present

## 2021-12-14 DIAGNOSIS — E875 Hyperkalemia: Secondary | ICD-10-CM | POA: Diagnosis not present

## 2021-12-14 DIAGNOSIS — X58XXXA Exposure to other specified factors, initial encounter: Secondary | ICD-10-CM | POA: Diagnosis present

## 2021-12-14 DIAGNOSIS — J811 Chronic pulmonary edema: Secondary | ICD-10-CM | POA: Diagnosis not present

## 2021-12-14 DIAGNOSIS — D630 Anemia in neoplastic disease: Secondary | ICD-10-CM | POA: Diagnosis not present

## 2021-12-14 DIAGNOSIS — L89322 Pressure ulcer of left buttock, stage 2: Secondary | ICD-10-CM | POA: Diagnosis not present

## 2021-12-14 DIAGNOSIS — E7800 Pure hypercholesterolemia, unspecified: Secondary | ICD-10-CM

## 2021-12-14 DIAGNOSIS — E871 Hypo-osmolality and hyponatremia: Secondary | ICD-10-CM | POA: Diagnosis not present

## 2021-12-14 DIAGNOSIS — R197 Diarrhea, unspecified: Secondary | ICD-10-CM

## 2021-12-14 DIAGNOSIS — R531 Weakness: Secondary | ICD-10-CM | POA: Diagnosis not present

## 2021-12-14 DIAGNOSIS — M6281 Muscle weakness (generalized): Secondary | ICD-10-CM | POA: Diagnosis not present

## 2021-12-14 DIAGNOSIS — Z87891 Personal history of nicotine dependence: Secondary | ICD-10-CM | POA: Diagnosis not present

## 2021-12-14 DIAGNOSIS — Z743 Need for continuous supervision: Secondary | ICD-10-CM | POA: Diagnosis not present

## 2021-12-14 DIAGNOSIS — E876 Hypokalemia: Secondary | ICD-10-CM | POA: Diagnosis not present

## 2021-12-14 DIAGNOSIS — I959 Hypotension, unspecified: Secondary | ICD-10-CM | POA: Diagnosis not present

## 2021-12-14 DIAGNOSIS — C569 Malignant neoplasm of unspecified ovary: Secondary | ICD-10-CM | POA: Diagnosis not present

## 2021-12-14 DIAGNOSIS — J9811 Atelectasis: Secondary | ICD-10-CM | POA: Diagnosis not present

## 2021-12-14 DIAGNOSIS — I499 Cardiac arrhythmia, unspecified: Secondary | ICD-10-CM | POA: Diagnosis not present

## 2021-12-14 DIAGNOSIS — I951 Orthostatic hypotension: Secondary | ICD-10-CM | POA: Diagnosis not present

## 2021-12-14 DIAGNOSIS — Z79899 Other long term (current) drug therapy: Secondary | ICD-10-CM | POA: Diagnosis not present

## 2021-12-14 DIAGNOSIS — E440 Moderate protein-calorie malnutrition: Secondary | ICD-10-CM | POA: Diagnosis not present

## 2021-12-14 DIAGNOSIS — C786 Secondary malignant neoplasm of retroperitoneum and peritoneum: Secondary | ICD-10-CM | POA: Diagnosis not present

## 2021-12-14 DIAGNOSIS — K521 Toxic gastroenteritis and colitis: Secondary | ICD-10-CM | POA: Diagnosis not present

## 2021-12-14 DIAGNOSIS — D709 Neutropenia, unspecified: Secondary | ICD-10-CM | POA: Diagnosis not present

## 2021-12-14 DIAGNOSIS — R188 Other ascites: Secondary | ICD-10-CM | POA: Diagnosis not present

## 2021-12-14 DIAGNOSIS — J90 Pleural effusion, not elsewhere classified: Secondary | ICD-10-CM | POA: Diagnosis not present

## 2021-12-14 DIAGNOSIS — R180 Malignant ascites: Secondary | ICD-10-CM | POA: Diagnosis not present

## 2021-12-14 DIAGNOSIS — R2681 Unsteadiness on feet: Secondary | ICD-10-CM | POA: Diagnosis not present

## 2021-12-14 DIAGNOSIS — R600 Localized edema: Secondary | ICD-10-CM | POA: Diagnosis not present

## 2021-12-14 DIAGNOSIS — D702 Other drug-induced agranulocytosis: Secondary | ICD-10-CM | POA: Insufficient documentation

## 2021-12-14 DIAGNOSIS — R000 Tachycardia, unspecified: Secondary | ICD-10-CM | POA: Diagnosis not present

## 2021-12-14 DIAGNOSIS — J9601 Acute respiratory failure with hypoxia: Secondary | ICD-10-CM | POA: Diagnosis not present

## 2021-12-14 DIAGNOSIS — R059 Cough, unspecified: Secondary | ICD-10-CM | POA: Diagnosis not present

## 2021-12-14 DIAGNOSIS — K219 Gastro-esophageal reflux disease without esophagitis: Secondary | ICD-10-CM | POA: Diagnosis not present

## 2021-12-14 DIAGNOSIS — R1312 Dysphagia, oropharyngeal phase: Secondary | ICD-10-CM | POA: Diagnosis not present

## 2021-12-14 DIAGNOSIS — R339 Retention of urine, unspecified: Secondary | ICD-10-CM | POA: Diagnosis not present

## 2021-12-14 DIAGNOSIS — Z5189 Encounter for other specified aftercare: Secondary | ICD-10-CM | POA: Diagnosis not present

## 2021-12-14 DIAGNOSIS — R2689 Other abnormalities of gait and mobility: Secondary | ICD-10-CM | POA: Diagnosis not present

## 2021-12-14 DIAGNOSIS — D701 Agranulocytosis secondary to cancer chemotherapy: Secondary | ICD-10-CM | POA: Diagnosis not present

## 2021-12-14 DIAGNOSIS — I498 Other specified cardiac arrhythmias: Secondary | ICD-10-CM | POA: Diagnosis not present

## 2021-12-14 DIAGNOSIS — D75839 Thrombocytosis, unspecified: Secondary | ICD-10-CM | POA: Diagnosis not present

## 2021-12-14 DIAGNOSIS — N179 Acute kidney failure, unspecified: Secondary | ICD-10-CM | POA: Diagnosis not present

## 2021-12-14 DIAGNOSIS — F32A Depression, unspecified: Secondary | ICD-10-CM | POA: Diagnosis not present

## 2021-12-14 DIAGNOSIS — Z95828 Presence of other vascular implants and grafts: Secondary | ICD-10-CM

## 2021-12-14 LAB — CMP (CANCER CENTER ONLY)
ALT: 17 U/L (ref 0–44)
AST: 23 U/L (ref 15–41)
Albumin: 2.7 g/dL — ABNORMAL LOW (ref 3.5–5.0)
Alkaline Phosphatase: 167 U/L — ABNORMAL HIGH (ref 38–126)
Anion gap: 12 (ref 5–15)
BUN: 60 mg/dL — ABNORMAL HIGH (ref 8–23)
CO2: 17 mmol/L — ABNORMAL LOW (ref 22–32)
Calcium: 8.8 mg/dL — ABNORMAL LOW (ref 8.9–10.3)
Chloride: 95 mmol/L — ABNORMAL LOW (ref 98–111)
Creatinine: 1.46 mg/dL — ABNORMAL HIGH (ref 0.44–1.00)
GFR, Estimated: 38 mL/min — ABNORMAL LOW (ref >60)
Glucose, Bld: 130 mg/dL — ABNORMAL HIGH (ref 70–99)
Potassium: 5.4 mmol/L — ABNORMAL HIGH (ref 3.5–5.1)
Sodium: 124 mmol/L — ABNORMAL LOW (ref 135–145)
Total Bilirubin: 0.7 mg/dL (ref 0.3–1.2)
Total Protein: 6.1 g/dL — ABNORMAL LOW (ref 6.5–8.1)

## 2021-12-14 LAB — CBC WITH DIFFERENTIAL (CANCER CENTER ONLY)
Abs Immature Granulocytes: 0 10*3/uL (ref 0.00–0.07)
Basophils Absolute: 0 10*3/uL (ref 0.0–0.1)
Basophils Relative: 0 %
Eosinophils Absolute: 0 10*3/uL (ref 0.0–0.5)
Eosinophils Relative: 2 %
HCT: 37.9 % (ref 36.0–46.0)
Hemoglobin: 12.6 g/dL (ref 12.0–15.0)
Immature Granulocytes: 0 %
Lymphocytes Relative: 77 %
Lymphs Abs: 0.7 10*3/uL (ref 0.7–4.0)
MCH: 28.8 pg (ref 26.0–34.0)
MCHC: 33.2 g/dL (ref 30.0–36.0)
MCV: 86.7 fL (ref 80.0–100.0)
Monocytes Absolute: 0.1 10*3/uL (ref 0.1–1.0)
Monocytes Relative: 11 %
Neutro Abs: 0.1 10*3/uL — CL (ref 1.7–7.7)
Neutrophils Relative %: 10 %
Platelet Count: 138 10*3/uL — ABNORMAL LOW (ref 150–400)
RBC: 4.37 MIL/uL (ref 3.87–5.11)
RDW: 13.5 % (ref 11.5–15.5)
Smear Review: NORMAL
WBC Count: 0.8 10*3/uL — CL (ref 4.0–10.5)
nRBC: 0 % (ref 0.0–0.2)

## 2021-12-14 LAB — SAMPLE TO BLOOD BANK

## 2021-12-14 MED ORDER — HEPARIN SOD (PORK) LOCK FLUSH 100 UNIT/ML IV SOLN
500.0000 [IU] | Freq: Once | INTRAVENOUS | Status: AC
  Administered 2021-12-14: 500 [IU] via INTRAVENOUS

## 2021-12-14 MED ORDER — LORAZEPAM 2 MG/ML IJ SOLN
0.5000 mg | INTRAMUSCULAR | Status: AC
  Administered 2021-12-14: 0.5 mg via INTRAVENOUS
  Filled 2021-12-14: qty 1

## 2021-12-14 MED ORDER — SODIUM CHLORIDE 0.9% FLUSH
10.0000 mL | Freq: Once | INTRAVENOUS | Status: AC
  Administered 2021-12-14: 10 mL via INTRAVENOUS

## 2021-12-14 MED ORDER — DIPHENOXYLATE-ATROPINE 2.5-0.025 MG PO TABS: 1.0000 | ORAL_TABLET | Freq: Four times a day (QID) | ORAL | 0 refills | Status: DC | PRN

## 2021-12-14 MED ORDER — SODIUM CHLORIDE 0.9 % IV SOLN: Freq: Once | INTRAVENOUS | Status: AC

## 2021-12-14 MED ORDER — SODIUM CHLORIDE 0.9 % IV SOLN
10.0000 mg | Freq: Once | INTRAVENOUS | Status: AC
  Administered 2021-12-14: 10 mg via INTRAVENOUS
  Filled 2021-12-14: qty 10

## 2021-12-14 MED ORDER — FAMOTIDINE IN NACL 20-0.9 MG/50ML-% IV SOLN
20.0000 mg | Freq: Once | INTRAVENOUS | Status: AC
  Administered 2021-12-14: 20 mg via INTRAVENOUS
  Filled 2021-12-14: qty 50

## 2021-12-14 MED ORDER — PEGFILGRASTIM INJECTION 6 MG/0.6ML ~~LOC~~
6.0000 mg | PREFILLED_SYRINGE | Freq: Once | SUBCUTANEOUS | Status: AC
  Administered 2021-12-14: 6 mg via SUBCUTANEOUS
  Filled 2021-12-14: qty 0.6

## 2021-12-14 NOTE — Progress Notes (Signed)
Patient here for symptom management with her daughter. She had a rough weekend with diarrhea and poor oral intake. She denies n/v but says she developed diarrhea and everything is running through her. She never took the lactulose, just the benefiber. She feels extremely weak.   Gave her samples of pediasure for home use. Referral for dietician placed.   Oncology Nurse Navigator Documentation     12/14/2021    2:30 PM  Oncology Nurse Navigator Flowsheets  Navigator Follow Up Date: 12/17/2021  Navigator Follow Up Reason: Radiology  Navigator Location [facility]  Navigator Encounter Type Treatment  Patient Visit Type MedOnc  Treatment Phase Active Tx  Barriers/Navigation Needs Coordination of Care;Education  Education Pain/ Symptom Management  Interventions Education;Referrals;Psycho-Social Support  Acuity Level 2-Minimal Needs (1-2 Barriers Identified)  Referrals Nutrition/dietician  Education Method Verbal  Support Groups/Services Friends and Family  Time Spent with Patient 15

## 2021-12-14 NOTE — Telephone Encounter (Signed)
Erline Levine (daughter) called in stating that pt was seen @ urgent care over the weekend due to leakage from her abdomen after fluid removal on 12/08/21. Fluid was clear with no odor or signs of blood. Pt was patched up and no meds were dispensed. Pt has been having diarrhea over the weekend - about 3-4 a day. Pt was given imodium to take yesterday (2 doses). Pt is not eating or drinking well. Daughter thinks she is dehydrated as she is very weak and stumbling around.  Per Dr Marin Olp she needs labs and fluids today. Apt made for today at 1:00 PM. Daughter aware.

## 2021-12-14 NOTE — Addendum Note (Signed)
Addended by: Randolm Idol on: 12/14/2021 03:47 PM   Modules accepted: Orders

## 2021-12-14 NOTE — Addendum Note (Signed)
Addended by: Amelia Jo I on: 12/14/2021 02:21 PM   Modules accepted: Orders

## 2021-12-14 NOTE — Addendum Note (Signed)
Addended by: Tyler Aas A on: 12/14/2021 03:31 PM   Modules accepted: Orders

## 2021-12-14 NOTE — Addendum Note (Signed)
Addended by: Lucile Crater on: 12/14/2021 04:41 PM   Modules accepted: Orders

## 2021-12-14 NOTE — Addendum Note (Signed)
Addended by: Arbutus Ped on: 12/14/2021 03:35 PM   Modules accepted: Orders

## 2021-12-14 NOTE — Patient Instructions (Signed)

## 2021-12-14 NOTE — Patient Instructions (Addendum)
Dehydration, Adult Dehydration is a condition in which there is not enough water or other fluids in the body. This happens when a person loses more fluids than he or she takes in. Important organs, such as the kidneys, brain, and heart, cannot function without a proper amount of fluids. Any loss of fluids from the body can lead to dehydration. Dehydration can be mild, moderate, or severe. It should be treated right away to prevent it from becoming severe. What are the causes? Dehydration may be caused by: Conditions that cause loss of water or other fluids, such as diarrhea, vomiting, or sweating or urinating a lot. Not drinking enough fluids, especially when you are ill or doing activities that require a lot of energy. Other illnesses and conditions, such as fever or infection. Certain medicines, such as medicines that remove excess fluid from the body (diuretics). Lack of safe drinking water. Not being able to get enough water and food. What increases the risk? The following factors may make you more likely to develop this condition: Having a long-term (chronic) illness that has not been treated properly, such as diabetes, heart disease, or kidney disease. Being 65 years of age or older. Having a disability. Living in a place that is high in altitude, where thinner, drier air causes more fluid loss. Doing exercises that put stress on your body for a long time (endurance sports). What are the signs or symptoms? Symptoms of dehydration depend on how severe it is. Mild or moderate dehydration Thirst. Dry lips or dry mouth. Dizziness or light-headedness, especially when standing up from a seated position. Muscle cramps. Dark urine. Urine may be the color of tea. Less urine or tears produced than usual. Headache. Severe dehydration Changes in skin. Your skin may be cold and clammy, blotchy, or pale. Your skin also may not return to normal after being lightly pinched and released. Little or  no tears, urine, or sweat. Changes in vital signs, such as rapid breathing and low blood pressure. Your pulse may be weak or may be faster than 100 beats a minute when you are sitting still. Other changes, such as: Feeling very thirsty. Sunken eyes. Cold hands and feet. Confusion. Being very tired (lethargic) or having trouble waking from sleep. Short-term weight loss. Loss of consciousness. How is this diagnosed? This condition is diagnosed based on your symptoms and a physical exam. You may have blood and urine tests to help confirm the diagnosis. How is this treated? Treatment for this condition depends on how severe it is. Treatment should be started right away. Do not wait until dehydration becomes severe. Severe dehydration is an emergency and needs to be treated in a hospital. Mild or moderate dehydration can be treated at home. You may be asked to: Drink more fluids. Drink an oral rehydration solution (ORS). This drink helps restore proper amounts of fluids and salts and minerals in the blood (electrolytes). Severe dehydration can be treated: With IV fluids. By correcting abnormal levels of electrolytes. This is often done by giving electrolytes through a tube that is passed through your nose and into your stomach (nasogastric tube, or NG tube). By treating the underlying cause of dehydration. Follow these instructions at home: Oral rehydration solution If told by your health care provider, drink an ORS: Make an ORS by following instructions on the package. Start by drinking small amounts, about  cup (120 mL) every 5-10 minutes. Slowly increase how much you drink until you have taken the amount recommended by your health   care provider. Eating and drinking        Drink enough clear fluid to keep your urine pale yellow. If you were told to drink an ORS, finish the ORS first and then start slowly drinking other clear fluids. Drink fluids such as: Water. Do not drink only  water. Doing that can lead to hyponatremia, which is having too little salt (sodium) in the body. Water from ice chips you suck on. Fruit juice that you have added water to (diluted fruit juice). Low-calorie sports drinks. Eat foods that contain a healthy balance of electrolytes, such as bananas, oranges, potatoes, tomatoes, and spinach. Do not drink alcohol. Avoid the following: Drinks that contain a lot of sugar. These include high-calorie sports drinks, fruit juice that is not diluted, and soda. Caffeine. Foods that are greasy or contain a lot of fat or sugar. General instructions Take over-the-counter and prescription medicines only as told by your health care provider. Do not take sodium tablets. Doing that can lead to having too much sodium in the body (hypernatremia). Return to your normal activities as told by your health care provider. Ask your health care provider what activities are safe for you. Keep all follow-up visits as told by your health care provider. This is important. Contact a health care provider if: You have muscle cramps, pain, or discomfort, such as: Pain in your abdomen and the pain gets worse or stays in one area (localizes). Stiff neck. You have a rash. You are more irritable than usual. You are sleepier or have a harder time waking than usual. You feel weak or dizzy. You feel very thirsty. Get help right away if you have: Any symptoms of severe dehydration. Symptoms of vomiting, such as: You cannot eat or drink without vomiting. Vomiting gets worse or does not go away. Vomit includes blood or green matter (bile). Symptoms that get worse with treatment. A fever. A severe headache. Problems with urination or bowel movements, such as: Diarrhea that gets worse or does not go away. Blood in your stool (feces). This may cause stool to look black and tarry. Not urinating, or urinating only a small amount of very dark urine, within 6-8 hours. Trouble  breathing. These symptoms may represent a serious problem that is an emergency. Do not wait to see if the symptoms will go away. Get medical help right away. Call your local emergency services (911 in the [place] Do not drive yourself to the hospital. Summary Dehydration is a condition in which there is not enough water or other fluids in the body. This happens when a person loses more fluids than he or she takes in. Treatment for this condition depends on how severe it is. Treatment should be started right away. Do not wait until dehydration becomes severe. Drink enough clear fluid to keep your urine pale yellow. If you were told to drink an oral rehydration solution (ORS), finish the ORS first and then start slowly drinking other clear fluids. Take over-the-counter and prescription medicines only as told by your health care provider. Get help right away if you have any symptoms of severe dehydration. This information is not intended to replace advice given to you by your health care provider. Make sure you discuss any questions you have with your health care provider. Document Revised: 08/04/2021 Document Reviewed: 11/08/2018 Elsevier Patient Education  2023 [employer] Inc[employer]  

## 2021-12-15 ENCOUNTER — Inpatient Hospital Stay (HOSPITAL_BASED_OUTPATIENT_CLINIC_OR_DEPARTMENT_OTHER)
Admission: EM | Admit: 2021-12-15 | Discharge: 2022-01-03 | DRG: 808 | Disposition: A | Discharge status: Skilled Nursing Facility | Payer: Medicare Other | Source: Ambulatory Visit | Attending: Internal Medicine | Admitting: Internal Medicine

## 2021-12-15 ENCOUNTER — Other Ambulatory Visit: Payer: Self-pay

## 2021-12-15 ENCOUNTER — Encounter (HOSPITAL_BASED_OUTPATIENT_CLINIC_OR_DEPARTMENT_OTHER): Payer: Self-pay

## 2021-12-15 ENCOUNTER — Emergency Department (HOSPITAL_BASED_OUTPATIENT_CLINIC_OR_DEPARTMENT_OTHER): Payer: Medicare Other

## 2021-12-15 ENCOUNTER — Encounter: Payer: Self-pay | Admitting: Hematology & Oncology

## 2021-12-15 ENCOUNTER — Telehealth: Payer: Self-pay

## 2021-12-15 ENCOUNTER — Other Ambulatory Visit: Payer: Self-pay | Admitting: *Deleted

## 2021-12-15 ENCOUNTER — Inpatient Hospital Stay: Payer: Medicare Other

## 2021-12-15 ENCOUNTER — Inpatient Hospital Stay (HOSPITAL_BASED_OUTPATIENT_CLINIC_OR_DEPARTMENT_OTHER): Payer: Medicare Other | Admitting: Hematology & Oncology

## 2021-12-15 ENCOUNTER — Encounter (HOSPITAL_COMMUNITY): Payer: Self-pay

## 2021-12-15 DIAGNOSIS — F32A Depression, unspecified: Secondary | ICD-10-CM | POA: Diagnosis present

## 2021-12-15 DIAGNOSIS — N179 Acute kidney failure, unspecified: Secondary | ICD-10-CM | POA: Diagnosis not present

## 2021-12-15 DIAGNOSIS — D709 Neutropenia, unspecified: Secondary | ICD-10-CM

## 2021-12-15 DIAGNOSIS — I951 Orthostatic hypotension: Secondary | ICD-10-CM | POA: Diagnosis present

## 2021-12-15 DIAGNOSIS — Z803 Family history of malignant neoplasm of breast: Secondary | ICD-10-CM

## 2021-12-15 DIAGNOSIS — T451X5A Adverse effect of antineoplastic and immunosuppressive drugs, initial encounter: Secondary | ICD-10-CM

## 2021-12-15 DIAGNOSIS — E871 Hypo-osmolality and hyponatremia: Secondary | ICD-10-CM | POA: Diagnosis not present

## 2021-12-15 DIAGNOSIS — E7800 Pure hypercholesterolemia, unspecified: Secondary | ICD-10-CM

## 2021-12-15 DIAGNOSIS — C561 Malignant neoplasm of right ovary: Secondary | ICD-10-CM

## 2021-12-15 DIAGNOSIS — J9601 Acute respiratory failure with hypoxia: Secondary | ICD-10-CM

## 2021-12-15 DIAGNOSIS — R197 Diarrhea, unspecified: Secondary | ICD-10-CM | POA: Diagnosis not present

## 2021-12-15 DIAGNOSIS — E875 Hyperkalemia: Secondary | ICD-10-CM

## 2021-12-15 DIAGNOSIS — E440 Moderate protein-calorie malnutrition: Secondary | ICD-10-CM | POA: Diagnosis present

## 2021-12-15 DIAGNOSIS — R339 Retention of urine, unspecified: Secondary | ICD-10-CM | POA: Diagnosis present

## 2021-12-15 DIAGNOSIS — D75839 Thrombocytosis, unspecified: Secondary | ICD-10-CM | POA: Diagnosis present

## 2021-12-15 DIAGNOSIS — K219 Gastro-esophageal reflux disease without esophagitis: Secondary | ICD-10-CM | POA: Diagnosis present

## 2021-12-15 DIAGNOSIS — Z83438 Family history of other disorder of lipoprotein metabolism and other lipidemia: Secondary | ICD-10-CM

## 2021-12-15 DIAGNOSIS — Z888 Allergy status to other drugs, medicaments and biological substances status: Secondary | ICD-10-CM

## 2021-12-15 DIAGNOSIS — B001 Herpesviral vesicular dermatitis: Secondary | ICD-10-CM | POA: Diagnosis not present

## 2021-12-15 DIAGNOSIS — E860 Dehydration: Secondary | ICD-10-CM | POA: Diagnosis present

## 2021-12-15 DIAGNOSIS — K521 Toxic gastroenteritis and colitis: Secondary | ICD-10-CM | POA: Diagnosis present

## 2021-12-15 DIAGNOSIS — Z833 Family history of diabetes mellitus: Secondary | ICD-10-CM

## 2021-12-15 DIAGNOSIS — J811 Chronic pulmonary edema: Secondary | ICD-10-CM | POA: Diagnosis not present

## 2021-12-15 DIAGNOSIS — Z95828 Presence of other vascular implants and grafts: Secondary | ICD-10-CM

## 2021-12-15 DIAGNOSIS — D702 Other drug-induced agranulocytosis: Secondary | ICD-10-CM

## 2021-12-15 DIAGNOSIS — R5381 Other malaise: Secondary | ICD-10-CM | POA: Diagnosis present

## 2021-12-15 DIAGNOSIS — Z79899 Other long term (current) drug therapy: Secondary | ICD-10-CM

## 2021-12-15 DIAGNOSIS — E876 Hypokalemia: Secondary | ICD-10-CM | POA: Diagnosis present

## 2021-12-15 DIAGNOSIS — M899 Disorder of bone, unspecified: Secondary | ICD-10-CM

## 2021-12-15 DIAGNOSIS — D630 Anemia in neoplastic disease: Secondary | ICD-10-CM | POA: Diagnosis present

## 2021-12-15 DIAGNOSIS — Z6827 Body mass index (BMI) 27.0-27.9, adult: Secondary | ICD-10-CM

## 2021-12-15 DIAGNOSIS — D701 Agranulocytosis secondary to cancer chemotherapy: Secondary | ICD-10-CM | POA: Diagnosis not present

## 2021-12-15 DIAGNOSIS — R000 Tachycardia, unspecified: Secondary | ICD-10-CM | POA: Diagnosis present

## 2021-12-15 DIAGNOSIS — I10 Essential (primary) hypertension: Secondary | ICD-10-CM

## 2021-12-15 DIAGNOSIS — R0602 Shortness of breath: Secondary | ICD-10-CM | POA: Diagnosis not present

## 2021-12-15 DIAGNOSIS — R180 Malignant ascites: Secondary | ICD-10-CM | POA: Diagnosis not present

## 2021-12-15 DIAGNOSIS — Z87891 Personal history of nicotine dependence: Secondary | ICD-10-CM

## 2021-12-15 DIAGNOSIS — J90 Pleural effusion, not elsewhere classified: Secondary | ICD-10-CM | POA: Diagnosis not present

## 2021-12-15 DIAGNOSIS — M810 Age-related osteoporosis without current pathological fracture: Secondary | ICD-10-CM | POA: Diagnosis present

## 2021-12-15 DIAGNOSIS — Z751 Person awaiting admission to adequate facility elsewhere: Secondary | ICD-10-CM

## 2021-12-15 DIAGNOSIS — X58XXXA Exposure to other specified factors, initial encounter: Secondary | ICD-10-CM | POA: Diagnosis present

## 2021-12-15 DIAGNOSIS — J9811 Atelectasis: Secondary | ICD-10-CM | POA: Diagnosis present

## 2021-12-15 DIAGNOSIS — D61810 Antineoplastic chemotherapy induced pancytopenia: Secondary | ICD-10-CM | POA: Diagnosis present

## 2021-12-15 DIAGNOSIS — E663 Overweight: Secondary | ICD-10-CM | POA: Diagnosis present

## 2021-12-15 DIAGNOSIS — Z823 Family history of stroke: Secondary | ICD-10-CM

## 2021-12-15 DIAGNOSIS — Z83511 Family history of glaucoma: Secondary | ICD-10-CM

## 2021-12-15 DIAGNOSIS — Z8249 Family history of ischemic heart disease and other diseases of the circulatory system: Secondary | ICD-10-CM

## 2021-12-15 LAB — BASIC METABOLIC PANEL
Anion gap: 11 (ref 5–15)
BUN: 60 mg/dL — ABNORMAL HIGH (ref 8–23)
CO2: 15 mmol/L — ABNORMAL LOW (ref 22–32)
Calcium: 8.1 mg/dL — ABNORMAL LOW (ref 8.9–10.3)
Chloride: 103 mmol/L (ref 98–111)
Creatinine, Ser: 1.58 mg/dL — ABNORMAL HIGH (ref 0.44–1.00)
GFR, Estimated: 35 mL/min — ABNORMAL LOW (ref >60)
Glucose, Bld: 88 mg/dL (ref 70–99)
Potassium: 5.8 mmol/L — ABNORMAL HIGH (ref 3.5–5.1)
Sodium: 129 mmol/L — ABNORMAL LOW (ref 135–145)

## 2021-12-15 LAB — CBC WITH DIFFERENTIAL (CANCER CENTER ONLY)
Abs Immature Granulocytes: 0 10*3/uL (ref 0.00–0.07)
Basophils Absolute: 0 10*3/uL (ref 0.0–0.1)
Basophils Relative: 0 %
Eosinophils Absolute: 0 10*3/uL (ref 0.0–0.5)
Eosinophils Relative: 0 %
HCT: 34.3 % — ABNORMAL LOW (ref 36.0–46.0)
Hemoglobin: 11.6 g/dL — ABNORMAL LOW (ref 12.0–15.0)
Immature Granulocytes: 0 %
Lymphocytes Relative: 71 %
Lymphs Abs: 0.3 10*3/uL — ABNORMAL LOW (ref 0.7–4.0)
MCH: 29.4 pg (ref 26.0–34.0)
MCHC: 33.8 g/dL (ref 30.0–36.0)
MCV: 86.8 fL (ref 80.0–100.0)
Monocytes Absolute: 0.1 10*3/uL (ref 0.1–1.0)
Monocytes Relative: 21 %
Neutro Abs: 0 10*3/uL — CL (ref 1.7–7.7)
Neutrophils Relative %: 8 %
Platelet Count: 124 10*3/uL — ABNORMAL LOW (ref 150–400)
RBC: 3.95 MIL/uL (ref 3.87–5.11)
RDW: 13.3 % (ref 11.5–15.5)
Smear Review: NORMAL
WBC Count: 0.5 10*3/uL — CL (ref 4.0–10.5)
nRBC: 0 % (ref 0.0–0.2)

## 2021-12-15 LAB — CMP (CANCER CENTER ONLY)
ALT: 16 U/L (ref 0–44)
AST: 21 U/L (ref 15–41)
Albumin: 2.6 g/dL — ABNORMAL LOW (ref 3.5–5.0)
Alkaline Phosphatase: 146 U/L — ABNORMAL HIGH (ref 38–126)
Anion gap: 10 (ref 5–15)
BUN: 64 mg/dL — ABNORMAL HIGH (ref 8–23)
CO2: 18 mmol/L — ABNORMAL LOW (ref 22–32)
Calcium: 8.7 mg/dL — ABNORMAL LOW (ref 8.9–10.3)
Chloride: 97 mmol/L — ABNORMAL LOW (ref 98–111)
Creatinine: 1.62 mg/dL — ABNORMAL HIGH (ref 0.44–1.00)
GFR, Estimated: 34 mL/min — ABNORMAL LOW (ref >60)
Glucose, Bld: 141 mg/dL — ABNORMAL HIGH (ref 70–99)
Potassium: 5.2 mmol/L — ABNORMAL HIGH (ref 3.5–5.1)
Sodium: 125 mmol/L — ABNORMAL LOW (ref 135–145)
Total Bilirubin: 0.7 mg/dL (ref 0.3–1.2)
Total Protein: 5.8 g/dL — ABNORMAL LOW (ref 6.5–8.1)

## 2021-12-15 LAB — COMPREHENSIVE METABOLIC PANEL
ALT: 18 U/L (ref 0–44)
AST: 25 U/L (ref 15–41)
Albumin: 1.9 g/dL — ABNORMAL LOW (ref 3.5–5.0)
Alkaline Phosphatase: 155 U/L — ABNORMAL HIGH (ref 38–126)
Anion gap: 11 (ref 5–15)
BUN: 63 mg/dL — ABNORMAL HIGH (ref 8–23)
CO2: 16 mmol/L — ABNORMAL LOW (ref 22–32)
Calcium: 8.1 mg/dL — ABNORMAL LOW (ref 8.9–10.3)
Chloride: 99 mmol/L (ref 98–111)
Creatinine, Ser: 1.65 mg/dL — ABNORMAL HIGH (ref 0.44–1.00)
GFR, Estimated: 33 mL/min — ABNORMAL LOW (ref >60)
Glucose, Bld: 119 mg/dL — ABNORMAL HIGH (ref 70–99)
Potassium: 5.6 mmol/L — ABNORMAL HIGH (ref 3.5–5.1)
Sodium: 126 mmol/L — ABNORMAL LOW (ref 135–145)
Total Bilirubin: 0.8 mg/dL (ref 0.3–1.2)
Total Protein: 5.7 g/dL — ABNORMAL LOW (ref 6.5–8.1)

## 2021-12-15 LAB — LACTIC ACID, PLASMA
Lactic Acid, Venous: 1.2 mmol/L (ref 0.5–1.9)
Lactic Acid, Venous: 0.9 mmol/L (ref 0.5–1.9)

## 2021-12-15 MED ORDER — INSULIN ASPART 100 UNIT/ML IV SOLN
5.0000 [IU] | Freq: Once | INTRAVENOUS | Status: AC
  Administered 2021-12-15: 5 [IU] via INTRAVENOUS

## 2021-12-15 MED ORDER — ATORVASTATIN CALCIUM 40 MG PO TABS
40.0000 mg | ORAL_TABLET | Freq: Every day | ORAL | Status: DC
  Administered 2021-12-15 – 2021-12-16 (×2): 40 mg via ORAL
  Filled 2021-12-15 (×2): qty 1

## 2021-12-15 MED ORDER — SODIUM CHLORIDE 0.9 % IV SOLN: INTRAVENOUS | Status: AC

## 2021-12-15 MED ORDER — OMEPRAZOLE MAGNESIUM 20 MG PO TBEC: 20.0000 mg | DELAYED_RELEASE_TABLET | Freq: Every day | ORAL | Status: DC

## 2021-12-15 MED ORDER — SODIUM CHLORIDE 0.9 % IV SOLN
2.0000 g | INTRAVENOUS | Status: DC
  Administered 2021-12-15 – 2021-12-16 (×2): 2 g via INTRAVENOUS
  Filled 2021-12-15 (×2): qty 20

## 2021-12-15 MED ORDER — SODIUM CHLORIDE 0.9 % IV BOLUS
1000.0000 mL | Freq: Once | INTRAVENOUS | Status: AC
  Administered 2021-12-15: 1000 mL via INTRAVENOUS

## 2021-12-15 MED ORDER — PANTOPRAZOLE SODIUM 40 MG PO TBEC
40.0000 mg | DELAYED_RELEASE_TABLET | Freq: Every day | ORAL | Status: DC
Start: 2021-12-16 — End: 2021-12-19
  Administered 2021-12-17 – 2021-12-19 (×3): 40 mg via ORAL
  Filled 2021-12-15 (×3): qty 1

## 2021-12-15 MED ORDER — SODIUM ZIRCONIUM CYCLOSILICATE 10 G PO PACK
10.0000 g | PACK | Freq: Once | ORAL | Status: AC
Start: 2021-12-15 — End: 2021-12-15
  Administered 2021-12-15: 10 g via ORAL
  Filled 2021-12-15: qty 1

## 2021-12-15 MED ORDER — ALBUMIN HUMAN 25 % IV SOLN: 50.0000 g | Freq: Once | INTRAVENOUS | Status: DC

## 2021-12-15 MED ORDER — METRONIDAZOLE 500 MG/100ML IV SOLN
500.0000 mg | Freq: Two times a day (BID) | INTRAVENOUS | Status: DC
  Administered 2021-12-15 – 2021-12-17 (×3): 500 mg via INTRAVENOUS
  Filled 2021-12-15 (×3): qty 100

## 2021-12-15 MED ORDER — ALBUMIN HUMAN 25 % IV SOLN
12.5000 g | Freq: Once | INTRAVENOUS | Status: AC
  Administered 2021-12-15: 12.5 g via INTRAVENOUS
  Filled 2021-12-15: qty 50

## 2021-12-15 MED ORDER — ALBUMIN HUMAN 25 % IV SOLN
12.5000 g | Freq: Once | INTRAVENOUS | Status: AC
  Administered 2021-12-16: 12.5 g via INTRAVENOUS
  Filled 2021-12-15: qty 50

## 2021-12-15 MED ORDER — SODIUM CHLORIDE 0.9 % IV SOLN: Freq: Once | INTRAVENOUS | Status: AC

## 2021-12-15 MED ORDER — DEXTROSE 50 % IV SOLN
1.0000 | Freq: Once | INTRAVENOUS | Status: AC
Start: 2021-12-15 — End: 2021-12-15
  Administered 2021-12-15: 50 mL via INTRAVENOUS
  Filled 2021-12-15: qty 50

## 2021-12-15 MED ORDER — LEVALBUTEROL HCL 0.63 MG/3ML IN NEBU
0.6300 mg | INHALATION_SOLUTION | Freq: Four times a day (QID) | RESPIRATORY_TRACT | Status: DC | PRN
  Administered 2021-12-15 – 2021-12-21 (×2): 0.63 mg via RESPIRATORY_TRACT
  Filled 2021-12-15 (×2): qty 3

## 2021-12-15 NOTE — Patient Instructions (Signed)
Dehydration, Adult Dehydration is a condition in which there is not enough water or other fluids in the body. This happens when a person loses more fluids than he or she takes in. Important organs, such as the kidneys, brain, and heart, cannot function without a proper amount of fluids. Any loss of fluids from the body can lead to dehydration. Dehydration can be mild, moderate, or severe. It should be treated right away to prevent it from becoming severe. What are the causes? Dehydration may be caused by: Conditions that cause loss of water or other fluids, such as diarrhea, vomiting, or sweating or urinating a lot. Not drinking enough fluids, especially when you are ill or doing activities that require a lot of energy. Other illnesses and conditions, such as fever or infection. Certain medicines, such as medicines that remove excess fluid from the body (diuretics). Lack of safe drinking water. Not being able to get enough water and food. What increases the risk? The following factors may make you more likely to develop this condition: Having a long-term (chronic) illness that has not been treated properly, such as diabetes, heart disease, or kidney disease. Being 65 years of age or older. Having a disability. Living in a place that is high in altitude, where thinner, drier air causes more fluid loss. Doing exercises that put stress on your body for a long time (endurance sports). What are the signs or symptoms? Symptoms of dehydration depend on how severe it is. Mild or moderate dehydration Thirst. Dry lips or dry mouth. Dizziness or light-headedness, especially when standing up from a seated position. Muscle cramps. Dark urine. Urine may be the color of tea. Less urine or tears produced than usual. Headache. Severe dehydration Changes in skin. Your skin may be cold and clammy, blotchy, or pale. Your skin also may not return to normal after being lightly pinched and released. Little or  no tears, urine, or sweat. Changes in vital signs, such as rapid breathing and low blood pressure. Your pulse may be weak or may be faster than 100 beats a minute when you are sitting still. Other changes, such as: Feeling very thirsty. Sunken eyes. Cold hands and feet. Confusion. Being very tired (lethargic) or having trouble waking from sleep. Short-term weight loss. Loss of consciousness. How is this diagnosed? This condition is diagnosed based on your symptoms and a physical exam. You may have blood and urine tests to help confirm the diagnosis. How is this treated? Treatment for this condition depends on how severe it is. Treatment should be started right away. Do not wait until dehydration becomes severe. Severe dehydration is an emergency and needs to be treated in a hospital. Mild or moderate dehydration can be treated at home. You may be asked to: Drink more fluids. Drink an oral rehydration solution (ORS). This drink helps restore proper amounts of fluids and salts and minerals in the blood (electrolytes). Severe dehydration can be treated: With IV fluids. By correcting abnormal levels of electrolytes. This is often done by giving electrolytes through a tube that is passed through your nose and into your stomach (nasogastric tube, or NG tube). By treating the underlying cause of dehydration. Follow these instructions at home: Oral rehydration solution If told by your health care provider, drink an ORS: Make an ORS by following instructions on the package. Start by drinking small amounts, about  cup (120 mL) every 5-10 minutes. Slowly increase how much you drink until you have taken the amount recommended by your health   care provider. Eating and drinking        Drink enough clear fluid to keep your urine pale yellow. If you were told to drink an ORS, finish the ORS first and then start slowly drinking other clear fluids. Drink fluids such as: Water. Do not drink only  water. Doing that can lead to hyponatremia, which is having too little salt (sodium) in the body. Water from ice chips you suck on. Fruit juice that you have added water to (diluted fruit juice). Low-calorie sports drinks. Eat foods that contain a healthy balance of electrolytes, such as bananas, oranges, potatoes, tomatoes, and spinach. Do not drink alcohol. Avoid the following: Drinks that contain a lot of sugar. These include high-calorie sports drinks, fruit juice that is not diluted, and soda. Caffeine. Foods that are greasy or contain a lot of fat or sugar. General instructions Take over-the-counter and prescription medicines only as told by your health care provider. Do not take sodium tablets. Doing that can lead to having too much sodium in the body (hypernatremia). Return to your normal activities as told by your health care provider. Ask your health care provider what activities are safe for you. Keep all follow-up visits as told by your health care provider. This is important. Contact a health care provider if: You have muscle cramps, pain, or discomfort, such as: Pain in your abdomen and the pain gets worse or stays in one area (localizes). Stiff neck. You have a rash. You are more irritable than usual. You are sleepier or have a harder time waking than usual. You feel weak or dizzy. You feel very thirsty. Get help right away if you have: Any symptoms of severe dehydration. Symptoms of vomiting, such as: You cannot eat or drink without vomiting. Vomiting gets worse or does not go away. Vomit includes blood or green matter (bile). Symptoms that get worse with treatment. A fever. A severe headache. Problems with urination or bowel movements, such as: Diarrhea that gets worse or does not go away. Blood in your stool (feces). This may cause stool to look black and tarry. Not urinating, or urinating only a small amount of very dark urine, within 6-8 hours. Trouble  breathing. These symptoms may represent a serious problem that is an emergency. Do not wait to see if the symptoms will go away. Get medical help right away. Call your local emergency services (911 in the [place] Do not drive yourself to the hospital. Summary Dehydration is a condition in which there is not enough water or other fluids in the body. This happens when a person loses more fluids than he or she takes in. Treatment for this condition depends on how severe it is. Treatment should be started right away. Do not wait until dehydration becomes severe. Drink enough clear fluid to keep your urine pale yellow. If you were told to drink an oral rehydration solution (ORS), finish the ORS first and then start slowly drinking other clear fluids. Take over-the-counter and prescription medicines only as told by your health care provider. Get help right away if you have any symptoms of severe dehydration. This information is not intended to replace advice given to you by your health care provider. Make sure you discuss any questions you have with your health care provider. Document Revised: 08/04/2021 Document Reviewed: 11/08/2018 Elsevier Patient Education  2023 [employer] Inc.  

## 2021-12-15 NOTE — Assessment & Plan Note (Signed)
Creatinine of 1.65 which has progressively been increasing since a month ago at 0.93. -Continue to follow with continuous IV fluids -Avoid nephrotoxic agent

## 2021-12-15 NOTE — Assessment & Plan Note (Signed)
Likely due to intolerance to chemotherapy.  Although this is improving with last bowel movement yesterday. -check GI stool panel and C.diff  -given her neutropenia although afebrile, will start prophylactic IV Rocephin and Flagyl while awaiting stool studies

## 2021-12-15 NOTE — Assessment & Plan Note (Signed)
-  Will arrange for thoracentesis tomorrow

## 2021-12-15 NOTE — ED Triage Notes (Signed)
Pt sent from cancer doctor. Received first chemo shot last Tuesday, Pt has been very weak. SOB and fluid increased in abdomen

## 2021-12-15 NOTE — Progress Notes (Signed)
Hematology and Oncology Follow Up Visit  Maria Romero [id number] 03-30-1951 71 y.o. 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  She was in the office yesterday.  She was incredibly dehydrated.  She had a lot of diarrhea.  She had chemotherapy a week ago.  I am just surprised that she has had such a hard time with chemotherapy.  Her white cell count is 800.  She did get a dose of Neulasta.  We gave her a lot of IV fluid.  She went home.  She came back again today.  She is fallen a couple times.  She is just very very weak.  She has had no obvious fever.  She is not eating all that much.  She has had no mucositis.  She does look quite pale.  Her hemoglobin is 11.6.  Her BUN is 64 creatinine 1.62.  Calcium is 8.7 with an albumin of 2.6.  Her sodium is 125 with a potassium of 5.2.  Again, I do still see that she is going be able to go home.  Again she is markedly neutropenic.  Again we did give her dose of Neulasta.  I do not think she is septic.  However, she probably is going to be on prophylactic antibiotics.  Looks like she may be having ascites and coming back.  We may have to think about doing another paracentesis with her in the hospital.    Overall, I would say performance status is probably ECOG 2.    Medications:  Current Outpatient Medications:    atorvastatin (LIPITOR) 40 MG tablet, Take 1 tablet (40 mg total) by mouth at bedtime., Disp: 90 tablet, Rfl: 3   Calcium Carb-Cholecalciferol (CALCIUM 1000 + D PO), Take 1 tablet by mouth daily., Disp: , Rfl:    cholecalciferol (VITAMIN D3) 25 MCG (1000 UNIT) tablet, Take 1,000 Units by mouth daily., Disp: , Rfl:    dexamethasone (DECADRON) 4 MG tablet, Take 2 tablets by mouth starting the day after chemotherapy for 3 days.  Take with food., Disp: 30 tablet, Rfl: 1   diphenoxylate-atropine (LOMOTIL) 2.5-0.025 MG tablet, Take 1 tablet by mouth 4 (four) times daily as needed for diarrhea or loose stools., Disp: 30 tablet, Rfl: 0   Lactulose 20 GM/30ML SOLN, Take 30 mLs (20 g total) by mouth every 4 (four) hours as needed. Take every 4hrs until BM., Disp: 450 mL, Rfl: 2   lidocaine-prilocaine (EMLA) cream, Apply to affected area once, Disp: 30 g, Rfl: 3   Omega-3 Fatty Acids (FISH OIL MAXIMUM STRENGTH) 1200 MG CAPS, Take 1 capsule by mouth 2 (two) times daily., Disp: , Rfl:    omeprazole (PRILOSEC) 10 MG capsule, Take by mouth daily. Unsure dose**, Disp: , Rfl:    ondansetron (ZOFRAN) 8 MG tablet, Take 1 tablet (8 mg total) by mouth every 8 (eight) hours as needed for nausea or vomiting. Start on the third day after chemotherapy., Disp: 30 tablet, Rfl: 1   prochlorperazine (COMPAZINE) 10 MG tablet, Take 1 tablet (10 mg total) by mouth every 6 (six) hours as needed for nausea or vomiting., Disp: 30 tablet, Rfl: 1  Allergies:  Allergies  Allergen Reactions   Fluvastatin Sodium Other (See Comments)    REACTION: Myalgias    Past Medical History, Surgical  history, Social history, and Family History were reviewed and updated.  Review of Systems: Review of Systems  Constitutional: Negative.   HENT:  Negative.    Eyes: Negative.   Respiratory: Negative.    Cardiovascular: Negative.   Gastrointestinal:  Positive for abdominal pain.  Genitourinary:  Positive for pelvic pain.   Musculoskeletal: Negative.   Skin: Negative.   Neurological: Negative.   Hematological: Negative.   Psychiatric/Behavioral: Negative.      Physical Exam:  vitals were not taken for this visit.   Wt Readings from Last 3 Encounters:  11/26/21 145 lb 1 oz (65.8 kg)  11/26/21 145 lb 1.6 oz (65.8 kg)  11/19/21 155 lb 1.9 oz (70.4 kg)    Physical Exam Vitals reviewed.  HENT:     Head: Normocephalic and atraumatic.  Eyes:     Pupils:  Pupils are equal, round, and reactive to light.  Cardiovascular:     Rate and Rhythm: Normal rate and regular rhythm.     Heart sounds: Normal heart sounds.  Pulmonary:     Effort: Pulmonary effort is normal.     Breath sounds: Normal breath sounds.  Abdominal:     General: Bowel sounds are normal.     Palpations: Abdomen is soft.  Musculoskeletal:        General: No tenderness or deformity. Normal range of motion.     Cervical back: Normal range of motion.  Lymphadenopathy:     Cervical: No cervical adenopathy.  Skin:    General: Skin is warm and dry.     Findings: No erythema or rash.  Neurological:     Mental Status: She is alert and oriented to person, place, and time.  Psychiatric:        Behavior: Behavior normal.        Thought Content: Thought content normal.        Judgment: Judgment normal.      Lab Results  Component Value Date   WBC 0.5 (LL) 12/15/2021   HGB 11.6 (L) 12/15/2021   HCT 34.3 (L) 12/15/2021   MCV 86.8 12/15/2021   PLT 124 (L) 12/15/2021     Chemistry      Component Value Date/Time   NA 125 (L) 12/15/2021 0806   K 5.2 (H) 12/15/2021 0806   CL 97 (L) 12/15/2021 0806   CO2 18 (L) 12/15/2021 0806   BUN 64 (H) 12/15/2021 0806   CREATININE 1.62 (H) 12/15/2021 0806   CREATININE 0.93 11/03/2021 0000      Component Value Date/Time   CALCIUM 8.7 (L) 12/15/2021 0806   ALKPHOS 146 (H) 12/15/2021 0806   AST 21 12/15/2021 0806   ALT 16 12/15/2021 0806   BILITOT 0.7 12/15/2021 0806      Impression and Plan: Maria Romero is a very charming 71 year old white female.  Looks like she has what I would consider to be stage IIIC ovarian cancer.  She has a large right ovarian mass.  Again, I am just very surprised that she has had such a tough time with chemotherapy.  I really would like to try to give her the full course of neoadjuvant chemotherapy.  Again, I am not sure we will be able to do the next cycle on schedule.  She will have to be admitted.  I  also think that she actually may need to be transfused despite the hemoglobin.  Her renal function is not that great.  Again it looks of the numbers still show she is dehydrated.  Maybe, in some of the ascites removed might help a little bit.  We will have to get her down to the ER.  She will then have to be admitted.  We will find her in the hospital.   Volanda Napoleon, MD 9/6/202311:03 AM

## 2021-12-15 NOTE — Telephone Encounter (Signed)
Dr Marin Olp aware of critical low WBC/ANC. No new orders at this time. dph

## 2021-12-15 NOTE — Assessment & Plan Note (Signed)
Hold antihypertensive while volume depleted.

## 2021-12-15 NOTE — Patient Instructions (Signed)
Implanted Port Removal, Care After The following information offers guidance on how to care for yourself after your procedure. Your health care provider may also give you more specific instructions. If you have problems or questions, contact your health care provider. What can I expect after the procedure? After the procedure, it is common to have: Soreness or pain near your incision. Some swelling or bruising near your incision. Follow these instructions at home: Medicines Take over-the-counter and prescription medicines only as told by your health care provider. If you were prescribed an antibiotic medicine, take it as told by your health care provider. Do not stop taking the antibiotic even if you start to feel better. Bathing Do not take baths, swim, or use a hot tub until your health care provider approves. Ask your health care provider if you can take showers. You may only be allowed to take sponge baths. Incision care  Follow instructions from your health care provider about how to take care of your incision. Make sure you: Wash your hands with soap and water for at least 20 seconds before and after you change your bandage (dressing). If soap and water are not available, use hand sanitizer. Change your dressing as told by your health care provider. Keep your dressing dry. Leave stitches (sutures), skin glue, or adhesive strips in place. These skin closures may need to stay in place for 2 weeks or longer. If adhesive strip edges start to loosen and curl up, you may trim the loose edges. Do not remove adhesive strips completely unless your health care provider tells you to do that. Check your incision area every day for signs of infection. Check for: More redness, swelling, or pain. More fluid or blood. Warmth. Pus or a bad smell. Activity Return to your normal activities as told by your health care provider. Ask your health care provider what activities are safe for you. You may have  to avoid lifting. Ask your health care provider how much you can safely lift. Do not do activities that involve lifting your arms over your head. Driving  If you were given a sedative during the procedure, it can affect you for several hours. Do not drive or operate machinery until your health care provider says that it is safe. If you did not receive a sedative, ask your health care provider when it is safe to drive. General instructions Do not use any products that contain nicotine or tobacco. These products include cigarettes, chewing tobacco, and vaping devices, such as e-cigarettes. These can delay healing after surgery. If you need help quitting, ask your health care provider. Keep all follow-up visits. This is important. Contact a health care provider if: You have a fever or chills. You have more redness, swelling, or pain around your incision. You have more fluid or blood coming from your incision. Your incision feels warm to the touch. You have pus or a bad smell coming from your incision. You have pain that is not relieved by your pain medicine. Get help right away if: You have chest pain. You have difficulty breathing. These symptoms may be an emergency. Get help right away. Call 911. Do not wait to see if the symptoms will go away. Do not drive yourself to the hospital. Summary After the procedure, it is common to have pain, soreness, swelling, or bruising near your incision. If you were prescribed an antibiotic medicine, take it as told by your health care provider. Do not stop taking the antibiotic even if you   start to feel better. If you were given a sedative during the procedure, it can affect you for several hours. Do not drive or operate machinery until your health care provider says that it is safe. Return to your normal activities as told by your health care provider. Ask your health care provider what activities are safe for you. This information is not intended to  replace advice given to you by your health care provider. Make sure you discuss any questions you have with your health care provider. Document Revised: 09/29/2020 Document Reviewed: 09/29/2020 Elsevier Patient Education  2023 [employer]vier Inc.  

## 2021-12-15 NOTE — Assessment & Plan Note (Signed)
Requiring O2 supplement with tachynpea due to enlarging left sided pleural effusion -will arrange for thoracentesis tomorrow with fluid study although likely malignant

## 2021-12-15 NOTE — Assessment & Plan Note (Signed)
Secondary to chemotherapy.  She is on neoadjuvant chemotherapy with carboplatinum/Taxol with first cycle on 12/07/2021. -Received Neulasta at oncology office 12/14/2021.   -No fever at this time

## 2021-12-15 NOTE — Assessment & Plan Note (Addendum)
Sodium of 126. Follow repeat with BMP on continuous IV fluid.

## 2021-12-15 NOTE — Progress Notes (Signed)
Plan of Care Note for accepted transfer   Patient: Maria Romero MRN: [id number]   DOA: 12/15/2021  Facility requesting transfer: [facility].  Requesting Provider: Isla Pence, MD. Reason for transfer: AKI and neutropenia. Facility course:  71 year old female who received cycle 1 of carboplatin/Doxil for ovarian cancer 9 days ago who was seen yesterday the cancer center due to dehydration in the setting of diarrhea.  She got IV fluids and Neulasta because her WBC were only 800.  She returns today and her oncologist referred her to the emergency department due to persistent symptoms, although her diarrhea is better.  She received a liter of normal saline bolus in the emergency department.  I added Lokelma 10 g p.o. x1 dose and albumin 50 g IVPB.  The patient had a therapeutic paracentesis recently and is scheduled to have another 1 in the 2 days.  CBC with Differential [[id number]] (Abnormal)   Collected: 12/15/21 1155   Updated: 12/15/21 1307   Specimen Type: Blood    WBC 0.7 Low Panic   K/uL   RBC 3.90 MIL/uL   Hemoglobin 11.3 Low  g/dL   HCT 33.1 Low  %   MCV 84.9 fL   MCH 29.0 pg   MCHC 34.1 g/dL   RDW 13.5 %   Platelets 120 Low  K/uL   nRBC 0.0 %   Neutrophils Relative % 7 %   Neutro Abs 0.1 Low Panic   K/uL   Lymphocytes Relative 69 %   Lymphs Abs 0.5 Low  K/uL   Monocytes Relative 22 %   Monocytes Absolute 0.2 K/uL   Eosinophils Relative 1 %   Eosinophils Absolute 0.0 K/uL   Basophils Relative 0 %   Basophils Absolute 0.0 K/uL   Immature Granulocytes 1 %   Abs Immature Granulocytes 0.01 K/uL   Abnormal Lymphocytes Present PRESENT   Burr Cells PRESENT   Ovalocytes PRESENT   Giant PLTs PRESENT  Culture, blood (routine x 2) [[id number]]   Collected: 12/15/21 1225   Updated: 12/15/21 1238   Specimen Type: Blood   Lactic acid, plasma [[id number]]   Collected: 12/15/21 1156   Updated: 12/15/21 1221   Specimen Type: Blood    Lactic Acid, Venous 1.2 mmol/L   Comprehensive metabolic panel [[id number]] (Abnormal)   Collected: 12/15/21 1155   Updated: 12/15/21 1219   Specimen Type: Blood    Sodium 126 Low  mmol/L   Potassium 5.6 High  mmol/L   Chloride 99 mmol/L   CO2 16 Low  mmol/L   Glucose, Bld 119 High  mg/dL   BUN 63 High  mg/dL   Creatinine, Ser 1.65 High  mg/dL   Calcium 8.1 Low  mg/dL   Total Protein 5.7 Low  g/dL   Albumin 1.9 Low  g/dL   AST 25 U/L   ALT 18 U/L   Alkaline Phosphatase 155 High  U/L   Total Bilirubin 0.8 mg/dL   GFR, Estimated 33 Low  mL/min   Anion gap 11   Imaging:  PORTABLE CHEST 1 VIEW   COMPARISON:  Two-view chest x-ray 12/07/2021   FINDINGS: Heart size exaggerated by low lung volumes. A left pleural effusion has increased significantly. Asymmetric airspace disease is present on the left. Moderate pulmonary vascular congestion is new.   IMPRESSION: 1. Increased left pleural effusion. 2. Associated airspace disease in the left lung likely reflects atelectasis. 3. New pulmonary vascular congestion.  Plan of care: The patient is accepted for admission to Progressive unit,  at [facility].  Author: Reubin Milan, MD 12/15/2021  Check [email or web address] for on-call coverage.  Nursing staff, Please call [place] number on Amion as soon as patient's arrival, so appropriate admitting provider can evaluate the pt.

## 2021-12-15 NOTE — H&P (Addendum)
History and Physical    Patient: Maria Romero IWP:[id number] DOB: 1951/02/14 DOA: 12/15/2021 DOS: the patient was seen and examined on 12/15/2021 PCP: Hali Marry, MD  Patient coming from:  [place] ED  Chief Complaint:  Chief Complaint  Patient presents with   Weakness   HPI: Maria Romero is a 70 y.o. 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  Patient was sent from oncology office today.  She had her first cycle of neoadjuvant chemotherapy with carboplatinum/Taxol on 12/07/2021 and about 3 days later begin to have persistent diarrhea.Decrease intake. No nausea or vomiting. No fever or chills.  She was seen in oncology office 12/14/2021 and given IV fluids as well as Neulasta for white cell count of 800.  She returned to oncology today because she continued to be symptomatic and weak to the point where she fell. Abdominal ascites also seem to be worsening.  She was then advised to present to ED to be admitted.  In the ED, she was afebrile, tachycardic to 101bpm, tachypneic.  WBC of 700.  Hemoglobin of 11.3.  Hyponatremia of 126, K of 5.6, creatinine of 1.65 which has been upward trending from 0.93 a month ago.  Albumin of 1.9.  Chest x-ray showing increasing left pleural effusion with vascular congestion.  She was given IV albumin, Lokelma and 1 L of normal saline fluid and started on continuous fluid prior to transfer here to [facility].  Review of Systems: As mentioned in the history of present illness. All other systems reviewed and are negative. Past Medical History:  Diagnosis Date   Goals of care, counseling/discussion 12/01/2021   Hypercholesterolemia    Hypertension    IBS (irritable bowel syndrome)    Malignant ascites 11/19/2021   Osteopenia    Osteoporosis    [name], right  ([place]) 12/01/2021   Ovarian mass, right 11/19/2021   Past Surgical History:  Procedure Laterality Date   IR IMAGING GUIDED PORT INSERTION  11/26/2021   IR PARACENTESIS  11/23/2021   IR PARACENTESIS  11/26/2021   lump removal  04/11/1993   axilla right - benign   TONSILLECTOMY     TUBAL LIGATION  04/11/1988   Social History:  reports that she quit smoking about 31 years ago. Her smoking use included cigarettes. She has never used smokeless tobacco. She reports that she does not currently use alcohol after a past usage of about 2.0 standard drinks of alcohol per week. She reports that she does not use drugs.  Allergies  Allergen Reactions   Fluvastatin Sodium Other (See Comments)    Myalgias    Family History  Problem Relation Age of Onset   Stroke Mother    Hypertension Mother    Hyperlipidemia Mother    Glaucoma Mother    Heart attack Father 25   Diabetes Father    Hypertension Father    Hyperlipidemia Father    Cataracts Father    Breast cancer Cousin    Colon cancer Neg Hx    Ovarian cancer Neg Hx    Endometrial cancer Neg Hx    Pancreatic cancer Neg Hx    Prostate cancer Neg Hx     Prior to Admission medications   Medication Sig Start Date End Date Taking? Authorizing Provider  atorvastatin (LIPITOR) 40 MG tablet Take 1 tablet (40 mg total) by mouth at bedtime. 11/05/20  Yes Hali Marry, MD  Calcium-Vitamin D-Vitamin K (VIACTIV PO) Take by mouth See admin instructions. Chew 1 square by mouth once a day- for calcium   Yes [provider]  cholecalciferol (VITAMIN D3) 25 MCG (1000 UNIT) tablet Take 1,000 Units by mouth daily.   Yes [provider]  dexamethasone (DECADRON) 4 MG tablet Take 2 tablets by mouth starting the day after chemotherapy for 3 days. Take with food. Patient taking differently: Take 8 mg by mouth See admin instructions. Take 8 mg (2 tablets) by mouth starting the day after chemotherapy for 3 days. Take with food. 12/01/21  Yes  Volanda Napoleon, MD  diphenoxylate-atropine (LOMOTIL) 2.5-0.025 MG tablet Take 1 tablet by mouth 4 (four) times daily as needed for diarrhea or loose stools. 12/14/21  Yes Volanda Napoleon, MD  lidocaine-prilocaine (EMLA) cream Apply to affected area once Patient taking differently: Apply 1 Application topically See admin instructions. Apply to affected area once (as directed) 12/01/21  Yes Ennever, Rudell Cobb, MD  Omega-3 Fatty Acids (FISH OIL MAXIMUM STRENGTH) 1200 MG CAPS Take 1,200 mg by mouth See admin instructions. Take 1,200 mg by mouth two times a day twice weekly   Yes [provider]  ondansetron (ZOFRAN) 8 MG tablet Take 1 tablet (8 mg total) by mouth every 8 (eight) hours as needed for nausea or vomiting. Start on the third day after chemotherapy. 12/01/21  Yes Ennever, Rudell Cobb, MD  PRILOSEC OTC 20 MG tablet Take 20 mg by mouth daily before breakfast.   Yes [provider]  prochlorperazine (COMPAZINE) 10 MG tablet Take 1 tablet (10 mg total) by mouth every 6 (six) hours as needed for nausea or vomiting. 12/01/21  Yes Ennever, Rudell Cobb, MD  Lactulose 20 GM/30ML SOLN Take 30 mLs (20 g total) by mouth every 4 (four) hours as needed. Take every 4hrs until BM. Patient not taking: Reported on 12/15/2021 12/10/21   Volanda Napoleon, MD    Physical Exam: Vitals:   12/15/21 1530 12/15/21 1600 12/15/21 1715 12/15/21 1822  BP: 97/63 101/71 103/69 125/69  Pulse: 96 100 (!) 102 (!) 107  Resp: 18 20 (!) 21 16  Temp: 98 F (36.7 C) (!) 97.3 F (36.3 C)  (!) 97.5 F (36.4 C)  TempSrc: Axillary     SpO2: 94% 95% 93% 95%  Height:       Constitutional: NAD, thin ill appearing elderly female appearing younger than stated age laying in bed Eyes: lids and conjunctivae normal ENMT: Mucous membranes are moist.  Neck: normal, supple Respiratory: clear to auscultation bilaterally, no wheezing, no crackles. Normal respiratory effort. No accessory muscle use. On 2L supplemental oxygen via nasal  cannula.  Cardiovascular: Regular rate and rhythm, no murmurs / rubs / gallops. No extremity edema. Abdomen:tense, mildly distended, no tenderness, no masses palpated. Bowel sounds positive.  Musculoskeletal: no clubbing / cyanosis. No joint deformity upper and lower extremities.  Normal muscle tone.  Skin: no rashes, lesions, ulcers.  Neurologic: CN 2-12 grossly intact.  Strength 5/5 in all 4.  Psychiatric: Normal judgment and insight. Alert and oriented x 3. Normal mood. Data Reviewed:  See HPI  Assessment and Plan: * Neutropenia ([place]) Secondary to chemotherapy.  She is on neoadjuvant chemotherapy with carboplatinum/Taxol with first cycle on 12/07/2021. -Received Neulasta at oncology office 12/14/2021.   -No fever at this time  Diarrhea Likely due to intolerance to chemotherapy.  Although this is improving with last bowel movement yesterday. -check GI stool  panel and C.diff  -given her neutropenia although afebrile, will start prophylactic IV Rocephin and Flagyl while awaiting stool studies  Acute respiratory failure with hypoxia ([facility]) Requiring O2 supplement with tachynpea due to enlarging left sided pleural effusion -will arrange for thoracentesis tomorrow with fluid study although likely malignant  Pleural effusion on left -Will arrange for thoracentesis tomorrow  AKI (acute kidney injury) ([place]) Creatinine of 1.65 which has progressively been increasing since a month ago at 0.93. -Continue to follow with continuous IV fluids -Avoid nephrotoxic agent  Hyperkalemia Potassium of 5.9.  She was given Lokelma x1 in the ED.  However more elevated on repeat, will give dextrose with 5U insulin.  Hyponatremia Sodium of 126. Follow repeat with BMP on continuous IV fluid.   [name], right ([place]) Follows with oncology Dr. Marin Olp. -Had first session of neoadjuvant chemotherapy on 12/07/2021 and has been feeling unwell.  Unclear if she can continue further cycles. -will have oncology  team to follow in the morning  Malignant ascites Last therapeutic paracentesis on 11/26/2021 with a 950 cc of peritoneal fluid removed -Abdomen is more tense and distended.Will arrange for paracentesis tomorrow with albumin IV.  Essential hypertension Hold antihypertensive while volume depleted.      Advance Care Planning:   Code Status: Full Code   Consults: none  Family Communication: Discussed with daughter and son-in-law at bedside  Severity of Illness: The appropriate patient status for this patient is OBSERVATION. Observation status is judged to be reasonable and necessary in order to provide the required intensity of service to ensure the patient's safety. The patient's presenting symptoms, physical exam findings, and initial radiographic and laboratory data in the context of their medical condition is felt to place them at decreased risk for further clinical deterioration. Furthermore, it is anticipated that the patient will be medically stable for discharge from the hospital within 2 midnights of admission.   Author: Orene Desanctis, DO 12/15/2021 10:41 PM  For on call review [email or web address].

## 2021-12-15 NOTE — ED Provider Notes (Signed)
[place] EMERGENCY DEPARTMENT Provider Note   CSN: [id number] Arrival date & time: 12/15/21  1140     History  Chief Complaint  Patient presents with   Weakness    Maria Romero is a 71 y.o. female.  Pt is a 71 yo female with a pmhx significant for high cholesterol, ibs, htn, and ovarian cancer that was diagnosed in July.  The plan is for chemo prior to an surgery due to the size of the mass.  The pt had her first round of chemo carboplatinum/Taxol on 12/07/2021.  The pt came to oncology yesterday and was very dehydrated.  She has had a lot of diarrhea.  She received IVFs.  She also received Neulasta yesterday. She is not any better today.  Dr. Marin Olp sent her down here for further eval.  Last paracentesis was on 8/31.  She is scheduled for another one on 9/8 at [facility].Pt feels like her abdomen is very swollen.  Pt said she can't stand up and has fallen b/c she feels weak.  She feels sob as well.       Home Medications Prior to Admission medications   Medication Sig Start Date End Date Taking? Authorizing Provider  atorvastatin (LIPITOR) 40 MG tablet Take 1 tablet (40 mg total) by mouth at bedtime. 11/05/20   Hali Marry, MD  Calcium Carb-Cholecalciferol (CALCIUM 1000 + D PO) Take 1 tablet by mouth daily.    [provider]  cholecalciferol (VITAMIN D3) 25 MCG (1000 UNIT) tablet Take 1,000 Units by mouth daily.    [provider]  dexamethasone (DECADRON) 4 MG tablet Take 2 tablets by mouth starting the day after chemotherapy for 3 days. Take with food. 12/01/21   Volanda Napoleon, MD  diphenoxylate-atropine (LOMOTIL) 2.5-0.025 MG tablet Take 1 tablet by mouth 4 (four) times daily as needed for diarrhea or loose stools. 12/14/21   Volanda Napoleon, MD  Lactulose 20 GM/30ML SOLN Take 30 mLs (20 g total) by mouth every 4 (four) hours as needed. Take every 4hrs until BM. 12/10/21   Volanda Napoleon, MD  lidocaine-prilocaine (EMLA) cream Apply to  affected area once 12/01/21   Ennever, Rudell Cobb, MD  Omega-3 Fatty Acids (FISH OIL MAXIMUM STRENGTH) 1200 MG CAPS Take 1 capsule by mouth 2 (two) times daily.    [provider]  omeprazole (PRILOSEC) 10 MG capsule Take by mouth daily. Unsure dose**    [provider]  ondansetron (ZOFRAN) 8 MG tablet Take 1 tablet (8 mg total) by mouth every 8 (eight) hours as needed for nausea or vomiting. Start on the third day after chemotherapy. 12/01/21   Volanda Napoleon, MD  prochlorperazine (COMPAZINE) 10 MG tablet Take 1 tablet (10 mg total) by mouth every 6 (six) hours as needed for nausea or vomiting. 12/01/21   Volanda Napoleon, MD      Allergies    Fluvastatin sodium    Review of Systems   Review of Systems  Constitutional:  Positive for appetite change.  Respiratory:  Positive for shortness of breath.   Gastrointestinal:  Positive for abdominal distention, abdominal pain and diarrhea.  Neurological:  Positive for weakness.  All other systems reviewed and are negative.   Physical Exam Updated Vital Signs BP 114/64   Pulse 95   Temp (!) 97.5 F (36.4 C) (Oral)   Resp (!) 23   Ht '5\' 3"'$  (1.6 m)   SpO2 96%   BMI 25.70 kg/m  Physical  Exam Vitals and nursing note reviewed.  Constitutional:      Appearance: She is ill-appearing.  HENT:     Head: Normocephalic and atraumatic.     Right Ear: External ear normal.     Left Ear: External ear normal.     Nose: Nose normal.     Mouth/Throat:     Mouth: Mucous membranes are dry.  Eyes:     Extraocular Movements: Extraocular movements intact.     Conjunctiva/sclera: Conjunctivae normal.     Pupils: Pupils are equal, round, and reactive to light.  Cardiovascular:     Rate and Rhythm: Regular rhythm. Tachycardia present.     Pulses: Normal pulses.     Heart sounds: Normal heart sounds.  Pulmonary:     Effort: Pulmonary effort is normal.     Breath sounds: Normal breath sounds.  Chest:     Comments: Port in right upper  chest Abdominal:     General: Abdomen is flat. Bowel sounds are normal. There is distension.     Palpations: Abdomen is soft.  Musculoskeletal:        General: Normal range of motion.     Cervical back: Normal range of motion and neck supple.  Skin:    General: Skin is warm.     Capillary Refill: Capillary refill takes 2 to 3 seconds.  Neurological:     General: No focal deficit present.     Mental Status: She is alert and oriented to person, place, and time.  Psychiatric:        Mood and Affect: Mood normal.        Behavior: Behavior normal.     ED Results / Procedures / Treatments   Labs (all labs ordered are listed, but only abnormal results are displayed) Labs Reviewed  COMPREHENSIVE METABOLIC PANEL - Abnormal; Notable for the following components:      Result Value   Sodium 126 (*)    Potassium 5.6 (*)    CO2 16 (*)    Glucose, Bld 119 (*)    BUN 63 (*)    Creatinine, Ser 1.65 (*)    Calcium 8.1 (*)    Total Protein 5.7 (*)    Albumin 1.9 (*)    Alkaline Phosphatase 155 (*)    GFR, Estimated 33 (*)    All other components within normal limits  CBC WITH DIFFERENTIAL/PLATELET - Abnormal; Notable for the following components:   WBC 0.7 (*)    Hemoglobin 11.3 (*)    HCT 33.1 (*)    Platelets 120 (*)    Neutro Abs 0.1 (*)    Lymphs Abs 0.5 (*)    All other components within normal limits  CULTURE, BLOOD (ROUTINE X 2)  CULTURE, BLOOD (ROUTINE X 2)  LACTIC ACID, PLASMA  URINALYSIS, ROUTINE W REFLEX MICROSCOPIC  LACTIC ACID, PLASMA    EKG None  Radiology DG Chest Portable 1 View  Result Date: 12/15/2021 CLINICAL DATA:  Shortness of breath. Cancer patient. Chemotherapy 1 week ago. EXAM: PORTABLE CHEST 1 VIEW COMPARISON:  Two-view chest x-ray 12/07/2021 FINDINGS: Heart size exaggerated by low lung volumes. A left pleural effusion has increased significantly. Asymmetric airspace disease is present on the left. Moderate pulmonary vascular congestion is new.  IMPRESSION: 1. Increased left pleural effusion. 2. Associated airspace disease in the left lung likely reflects atelectasis. 3. New pulmonary vascular congestion. Electronically Signed   By: San Morelle M.D.   On: 12/15/2021 12:47    Procedures Procedures  Medications Ordered in ED Medications  sodium zirconium cyclosilicate (LOKELMA) packet 10 g (has no administration in time range)  0.9 %  sodium chloride infusion (has no administration in time range)  albumin human 25 % solution 50 g (has no administration in time range)  sodium chloride 0.9 % bolus 1,000 mL (0 mLs Intravenous Stopped 12/15/21 1328)    ED Course/ Medical Decision Making/ A&P                           Medical Decision Making Amount and/or Complexity of Data Reviewed Labs: ordered. Radiology: ordered.  Risk Decision regarding hospitalization.   This patient presents to the ED for concern of weakness, this involves an extensive number of treatment options, and is a complaint that carries with it a high risk of complications and morbidity.  The differential diagnosis includes anemia, electrolyte abn, infection   Co morbidities that complicate the patient evaluation  high cholesterol, ibs, htn, and ovarian cancer    Additional history obtained:  Additional history obtained from epic chart review External records from outside source obtained and reviewed including Dr. Marin Olp   Lab Tests:  I Ordered, and personally interpreted labs.  The pertinent results include:  lactic nl; cmp with na low at 126, k elevated at 5.6; bun 63 and cr 1.65   Imaging Studies ordered:  I ordered imaging studies including cxr  I independently visualized and interpreted imaging which showed  IMPRESSION:  1. Increased left pleural effusion.  2. Associated airspace disease in the left lung likely reflects  atelectasis.  3. New pulmonary vascular congestion.   I agree with the radiologist interpretation   Cardiac  Monitoring:  The patient was maintained on a cardiac monitor.  I personally viewed and interpreted the cardiac monitored which showed an underlying rhythm of: sinus tachy   Medicines ordered and prescription drug management:  I ordered medication including ivfs  for dehydration  Reevaluation of the patient after these medicines showed that the patient improved I have reviewed the patients home medicines and have made adjustments as needed   Test Considered:  ct   Critical Interventions:  ivf   Consultations Obtained:  I requested consultation with the hospitalist (Dr. Olevia Bowens),  and discussed lab and imaging findings as well as pertinent plan -he will admit   Problem List / ED Course:  Intolerance of chemo:  pt given ivfs.  She looks tremendously weak.  Her oncologist requests admission as she is doing poorly at home despite getting fluids yesterday.  [facility] will admit. Neutropenia:  no fever or infection.  Pt did receive Neulasta yesterday. Hypokalemia and mild aki:  ivfs;  pt may need paracentesis and thoracentesis   Reevaluation:  After the interventions noted above, I reevaluated the patient and found that they have :improved   Social Determinants of Health:  Lives at home   Dispostion:  After consideration of the diagnostic results and the patients response to treatment, I feel that the patent would benefit from admission.          Final Clinical Impression(s) / ED Diagnoses Final diagnoses:  Dehydration  Neutropenia, unspecified type ([place])  Malignant neoplasm of right ovary (HCC)  Malignant ascites  Pleural effusion    Rx / DC Orders ED Discharge Orders     None         Isla Pence, MD 12/15/21 1350

## 2021-12-15 NOTE — Progress Notes (Signed)
Per Dr. Marin Olp, patient transported to E.R. via w/c. Port accessed and flushed with normal saline

## 2021-12-15 NOTE — Assessment & Plan Note (Addendum)
Last therapeutic paracentesis on 11/26/2021 with a 950 cc of peritoneal fluid removed -Abdomen is more tense and distended.Will arrange for paracentesis tomorrow with albumin IV.

## 2021-12-15 NOTE — ED Notes (Signed)
[facility] on unit to transport pt  

## 2021-12-15 NOTE — Assessment & Plan Note (Addendum)
Follows with oncology Dr. Marin Olp. -Had first session of neoadjuvant chemotherapy on 12/07/2021 and has been feeling unwell.  Unclear if she can continue further cycles. -will have oncology team to follow in the morning

## 2021-12-15 NOTE — ED Notes (Signed)
Pt daughter updated on POC per pt request

## 2021-12-15 NOTE — Assessment & Plan Note (Addendum)
Potassium of 5.9.  She was given Lokelma x1 in the ED.  However more elevated on repeat, will give dextrose with 5U insulin.

## 2021-12-16 ENCOUNTER — Encounter: Payer: Self-pay | Admitting: *Deleted

## 2021-12-16 ENCOUNTER — Inpatient Hospital Stay (HOSPITAL_COMMUNITY): Payer: Medicare Other

## 2021-12-16 ENCOUNTER — Telehealth: Payer: Self-pay | Admitting: Dietician

## 2021-12-16 ENCOUNTER — Inpatient Hospital Stay: Payer: Medicare Other | Admitting: Dietician

## 2021-12-16 DIAGNOSIS — Z5189 Encounter for other specified aftercare: Secondary | ICD-10-CM | POA: Diagnosis not present

## 2021-12-16 DIAGNOSIS — J9601 Acute respiratory failure with hypoxia: Secondary | ICD-10-CM | POA: Diagnosis present

## 2021-12-16 DIAGNOSIS — J90 Pleural effusion, not elsewhere classified: Secondary | ICD-10-CM | POA: Diagnosis present

## 2021-12-16 DIAGNOSIS — C786 Secondary malignant neoplasm of retroperitoneum and peritoneum: Secondary | ICD-10-CM | POA: Diagnosis not present

## 2021-12-16 DIAGNOSIS — K521 Toxic gastroenteritis and colitis: Secondary | ICD-10-CM | POA: Diagnosis present

## 2021-12-16 DIAGNOSIS — R180 Malignant ascites: Secondary | ICD-10-CM | POA: Diagnosis present

## 2021-12-16 DIAGNOSIS — E440 Moderate protein-calorie malnutrition: Secondary | ICD-10-CM | POA: Diagnosis present

## 2021-12-16 DIAGNOSIS — Z87891 Personal history of nicotine dependence: Secondary | ICD-10-CM | POA: Diagnosis not present

## 2021-12-16 DIAGNOSIS — D630 Anemia in neoplastic disease: Secondary | ICD-10-CM | POA: Diagnosis present

## 2021-12-16 DIAGNOSIS — R197 Diarrhea, unspecified: Secondary | ICD-10-CM | POA: Diagnosis not present

## 2021-12-16 DIAGNOSIS — E875 Hyperkalemia: Secondary | ICD-10-CM | POA: Diagnosis present

## 2021-12-16 DIAGNOSIS — J9811 Atelectasis: Secondary | ICD-10-CM | POA: Diagnosis present

## 2021-12-16 DIAGNOSIS — C569 Malignant neoplasm of unspecified ovary: Secondary | ICD-10-CM | POA: Diagnosis not present

## 2021-12-16 DIAGNOSIS — X58XXXA Exposure to other specified factors, initial encounter: Secondary | ICD-10-CM | POA: Diagnosis present

## 2021-12-16 DIAGNOSIS — C561 Malignant neoplasm of right ovary: Secondary | ICD-10-CM | POA: Diagnosis present

## 2021-12-16 DIAGNOSIS — I959 Hypotension, unspecified: Secondary | ICD-10-CM | POA: Diagnosis not present

## 2021-12-16 DIAGNOSIS — I499 Cardiac arrhythmia, unspecified: Secondary | ICD-10-CM | POA: Diagnosis not present

## 2021-12-16 DIAGNOSIS — D702 Other drug-induced agranulocytosis: Secondary | ICD-10-CM | POA: Diagnosis not present

## 2021-12-16 DIAGNOSIS — D61810 Antineoplastic chemotherapy induced pancytopenia: Secondary | ICD-10-CM | POA: Diagnosis present

## 2021-12-16 DIAGNOSIS — T451X5A Adverse effect of antineoplastic and immunosuppressive drugs, initial encounter: Secondary | ICD-10-CM | POA: Diagnosis not present

## 2021-12-16 DIAGNOSIS — E7800 Pure hypercholesterolemia, unspecified: Secondary | ICD-10-CM | POA: Diagnosis present

## 2021-12-16 DIAGNOSIS — I10 Essential (primary) hypertension: Secondary | ICD-10-CM | POA: Diagnosis present

## 2021-12-16 DIAGNOSIS — N179 Acute kidney failure, unspecified: Secondary | ICD-10-CM | POA: Diagnosis present

## 2021-12-16 DIAGNOSIS — R600 Localized edema: Secondary | ICD-10-CM | POA: Diagnosis not present

## 2021-12-16 DIAGNOSIS — E860 Dehydration: Secondary | ICD-10-CM

## 2021-12-16 DIAGNOSIS — R339 Retention of urine, unspecified: Secondary | ICD-10-CM | POA: Diagnosis not present

## 2021-12-16 DIAGNOSIS — R0602 Shortness of breath: Secondary | ICD-10-CM | POA: Diagnosis not present

## 2021-12-16 DIAGNOSIS — K219 Gastro-esophageal reflux disease without esophagitis: Secondary | ICD-10-CM | POA: Diagnosis present

## 2021-12-16 DIAGNOSIS — D72829 Elevated white blood cell count, unspecified: Secondary | ICD-10-CM | POA: Diagnosis not present

## 2021-12-16 DIAGNOSIS — E876 Hypokalemia: Secondary | ICD-10-CM | POA: Diagnosis present

## 2021-12-16 DIAGNOSIS — R059 Cough, unspecified: Secondary | ICD-10-CM | POA: Diagnosis not present

## 2021-12-16 DIAGNOSIS — I951 Orthostatic hypotension: Secondary | ICD-10-CM | POA: Diagnosis present

## 2021-12-16 DIAGNOSIS — D701 Agranulocytosis secondary to cancer chemotherapy: Secondary | ICD-10-CM | POA: Diagnosis present

## 2021-12-16 DIAGNOSIS — R188 Other ascites: Secondary | ICD-10-CM | POA: Diagnosis not present

## 2021-12-16 DIAGNOSIS — D75839 Thrombocytosis, unspecified: Secondary | ICD-10-CM | POA: Diagnosis present

## 2021-12-16 DIAGNOSIS — E871 Hypo-osmolality and hyponatremia: Secondary | ICD-10-CM | POA: Diagnosis present

## 2021-12-16 DIAGNOSIS — I498 Other specified cardiac arrhythmias: Secondary | ICD-10-CM | POA: Diagnosis not present

## 2021-12-16 DIAGNOSIS — Z79899 Other long term (current) drug therapy: Secondary | ICD-10-CM | POA: Diagnosis not present

## 2021-12-16 DIAGNOSIS — F32A Depression, unspecified: Secondary | ICD-10-CM | POA: Diagnosis present

## 2021-12-16 DIAGNOSIS — D709 Neutropenia, unspecified: Secondary | ICD-10-CM | POA: Diagnosis not present

## 2021-12-16 LAB — CBC WITH DIFFERENTIAL/PLATELET
Abs Immature Granulocytes: 0.01 10*3/uL (ref 0.00–0.07)
Basophils Absolute: 0 10*3/uL (ref 0.0–0.1)
Basophils Relative: 0 %
Eosinophils Absolute: 0 10*3/uL (ref 0.0–0.5)
Eosinophils Relative: 1 %
HCT: 33.1 % — ABNORMAL LOW (ref 36.0–46.0)
Hemoglobin: 11.3 g/dL — ABNORMAL LOW (ref 12.0–15.0)
Immature Granulocytes: 1 %
Lymphocytes Relative: 69 %
Lymphs Abs: 0.5 10*3/uL — ABNORMAL LOW (ref 0.7–4.0)
MCH: 29 pg (ref 26.0–34.0)
MCHC: 34.1 g/dL (ref 30.0–36.0)
MCV: 84.9 fL (ref 80.0–100.0)
Monocytes Absolute: 0.2 10*3/uL (ref 0.1–1.0)
Monocytes Relative: 22 %
Neutro Abs: 0.1 10*3/uL — CL (ref 1.7–7.7)
Neutrophils Relative %: 7 %
Platelets: 120 10*3/uL — ABNORMAL LOW (ref 150–400)
RBC: 3.9 MIL/uL (ref 3.87–5.11)
RDW: 13.5 % (ref 11.5–15.5)
WBC: 0.7 10*3/uL — CL (ref 4.0–10.5)
nRBC: 0 % (ref 0.0–0.2)

## 2021-12-16 LAB — URINALYSIS, ROUTINE W REFLEX MICROSCOPIC
Bilirubin Urine: NEGATIVE
Glucose, UA: 150 mg/dL — AB
Ketones, ur: NEGATIVE mg/dL
Leukocytes,Ua: NEGATIVE
Nitrite: NEGATIVE
Protein, ur: 30 mg/dL — AB
Specific Gravity, Urine: 1.019 (ref 1.005–1.030)
pH: 5 (ref 5.0–8.0)

## 2021-12-16 LAB — CBC
HCT: 31.3 % — ABNORMAL LOW (ref 36.0–46.0)
Hemoglobin: 10.3 g/dL — ABNORMAL LOW (ref 12.0–15.0)
MCH: 29.3 pg (ref 26.0–34.0)
MCHC: 32.9 g/dL (ref 30.0–36.0)
MCV: 88.9 fL (ref 80.0–100.0)
Platelets: 111 10*3/uL — ABNORMAL LOW (ref 150–400)
RBC: 3.52 MIL/uL — ABNORMAL LOW (ref 3.87–5.11)
RDW: 13.6 % (ref 11.5–15.5)
WBC: 0.7 10*3/uL — CL (ref 4.0–10.5)
nRBC: 0 % (ref 0.0–0.2)

## 2021-12-16 LAB — BASIC METABOLIC PANEL
Anion gap: 10 (ref 5–15)
BUN: 57 mg/dL — ABNORMAL HIGH (ref 8–23)
CO2: 16 mmol/L — ABNORMAL LOW (ref 22–32)
Calcium: 8 mg/dL — ABNORMAL LOW (ref 8.9–10.3)
Chloride: 106 mmol/L (ref 98–111)
Creatinine, Ser: 1.44 mg/dL — ABNORMAL HIGH (ref 0.44–1.00)
GFR, Estimated: 39 mL/min — ABNORMAL LOW (ref >60)
Glucose, Bld: 105 mg/dL — ABNORMAL HIGH (ref 70–99)
Potassium: 4.6 mmol/L (ref 3.5–5.1)
Sodium: 132 mmol/L — ABNORMAL LOW (ref 135–145)

## 2021-12-16 MED ORDER — CHLORHEXIDINE GLUCONATE CLOTH 2 % EX PADS
6.0000 | MEDICATED_PAD | Freq: Every morning | CUTANEOUS | Status: DC
  Administered 2021-12-16 – 2022-01-02 (×17): 6 via TOPICAL

## 2021-12-16 MED ORDER — BIOTENE DRY MOUTH MT LIQD
15.0000 mL | OROMUCOSAL | Status: DC
  Administered 2021-12-16 – 2022-01-03 (×72): 15 mL via OROMUCOSAL

## 2021-12-16 MED ORDER — SODIUM CHLORIDE 0.9% FLUSH
10.0000 mL | INTRAVENOUS | Status: DC | PRN
  Administered 2021-12-21 – 2021-12-31 (×2): 10 mL

## 2021-12-16 MED ORDER — ALBUMIN HUMAN 25 % IV SOLN
12.5000 g | Freq: Once | INTRAVENOUS | Status: AC
Start: 2021-12-16 — End: 2021-12-16
  Administered 2021-12-16: 12.5 g via INTRAVENOUS
  Filled 2021-12-16: qty 50

## 2021-12-16 MED ORDER — ALBUMIN HUMAN 25 % IV SOLN
12.5000 g | Freq: Once | INTRAVENOUS | Status: AC
  Administered 2021-12-16: 12.5 g via INTRAVENOUS
  Filled 2021-12-16: qty 50

## 2021-12-16 MED ORDER — LIDOCAINE HCL 1 % IJ SOLN
INTRAMUSCULAR | Status: AC
  Administered 2021-12-16: 10 mL
  Filled 2021-12-16: qty 20

## 2021-12-16 MED ORDER — LIP MEDEX EX OINT
TOPICAL_OINTMENT | CUTANEOUS | Status: DC | PRN
Start: 2021-12-16 — End: 2022-01-04

## 2021-12-16 NOTE — Progress Notes (Signed)
       CROSS COVER NOTE  NAME: Maria Romero MRN: [id number] DOB : 1950-08-05    Date of Service   12/16/2021   HPI/Events of Note   Notified by nursing that BP is soft and slowly trending down. BP now 87/57 s/p thoracentesis and 5L fluid removal today.  Interventions   Plan: Albumin 12.5g     This document was prepared using Dragon voice recognition software and may include unintentional dictation errors.  Neomia Glass DNP, MHA, FNP-BC Nurse Practitioner [facility] Pager [phone]

## 2021-12-16 NOTE — Procedures (Signed)
Ultrasound-guided  therapeutic paracentesis performed yielding 5 liters of golden yellow fluid. No immediate complications. EBL none.

## 2021-12-16 NOTE — Progress Notes (Signed)
PROGRESS NOTE    Maria Romero  KVQ:[id number] DOB: 05-Sep-1950 DOA: 12/15/2021 PCP: Hali Marry, MD    Brief Narrative:   Maria Romero is a 71 y.o. 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   Patient was sent from oncology office today.  She had her first cycle of neoadjuvant chemotherapy with carboplatinum/Taxol on 12/07/2021 and about 3 days later begin to have persistent diarrhea.Decrease intake. No nausea or vomiting. No fever or chills.  She was seen in oncology office 12/14/2021 and given IV fluids as well as Neulasta for white cell count of 800.  She returned to oncology today because she continued to be symptomatic and weak to the point where she fell. Abdominal ascites also seem to be worsening.  She was then advised to present to ED to be admitted   Assessment and Plan: * Neutropenia ([place]) Secondary to chemotherapy.  She is on neoadjuvant chemotherapy with carboplatinum/Taxol with first cycle on 12/07/2021. -Received Neulasta at oncology office 12/14/2021.   -No fever at this time  Diarrhea Likely due to intolerance to chemotherapy.  Although this is improving with last bowel movement yesterday. -check GI stool panel and C.diff  -given her neutropenia although afebrile, was started on prophylactic IV Rocephin and Flagyl while awaiting stool studies  Acute respiratory failure with hypoxia (HCC) Requiring O2 supplement with tachynpea due to enlarging left sided pleural effusion -thoracentesis in AM  Pleural effusion on left -thoracentesis if needed on Friday  AKI (acute kidney injury) ([place]) Creatinine of 1.65 which has progressively been increasing since a month ago at 0.93. -improved with IVF  Hyperkalemia -resolved  Hyponatremia -improved with IVF  [name], right  ([place]) Follows with oncology Dr. Marin Olp. -Had first session of neoadjuvant chemotherapy on 12/07/2021 and has been feeling unwell.   -appreciated Dr. Antonieta Pert note  Malignant ascites Last therapeutic paracentesis on 11/26/2021 with a 950 cc of peritoneal fluid removed -Abdomen is more tense and distended. -paracentesis 9/7 with albumin IV.  Essential hypertension Hold antihypertensive while volume depleted. -use albumin   DVT prophylaxis: SCDs Start: 12/15/21 2000    Code Status: Full Code   Disposition Plan:  Level of care: Progressive Status is: Inpatient Remains inpatient appropriate because: needs procedures    Consultants:  oncology   Subjective: Feels weak still  Objective: Vitals:   12/15/21 2330 12/16/21 0220 12/16/21 0607 12/16/21 0754  BP: 114/70 98/71 113/72 (!) 96/59  Pulse: (!) 106 100 (!) 105 (!) 106  Resp: '16 20 20 18  '$ Temp: 97.9 F (36.6 C) 98.6 F (37 C) (!) 97.5 F (36.4 C) 98.4 F (36.9 C)  TempSrc: Oral Axillary Oral Axillary  SpO2: 96% 97% 100% 95%  Weight:      Height:        Intake/Output Summary (Last 24 hours) at 12/16/2021 1108 Last data filed at 12/16/2021 0640 Gross per 24 hour  Intake 826.83 ml  Output 2279 ml  Net -1452.17 ml   Filed Weights   12/15/21 2000  Weight: 69.5 kg    Examination:   General: Appearance:     Overweight female in no acute distress     Lungs:     On [place],  respirations unlabored  Heart:    Tachycardic.     MS:   All extremities are intact.    Neurologic:   Awake, alert  Data Reviewed: I have personally reviewed following labs and imaging studies  CBC: Recent Labs  Lab 12/14/21 1355 12/15/21 0806 12/15/21 1155 12/16/21 0336  WBC 0.8* 0.5* 0.7* 0.7*  NEUTROABS 0.1* 0.0* 0.1*  --   HGB 12.6 11.6* 11.3* 10.3*  HCT 37.9 34.3* 33.1* 31.3*  MCV 86.7 86.8 84.9 88.9  PLT 138* 124* 120* 076*   Basic Metabolic Panel: Recent Labs  Lab 12/14/21 1355 12/15/21 0806 12/15/21 1155  12/15/21 1944 12/16/21 0336  NA 124* 125* 126* 129* 132*  K 5.4* 5.2* 5.6* 5.8* 4.6  CL 95* 97* 99 103 106  CO2 17* 18* 16* 15* 16*  GLUCOSE 130* 141* 119* 88 105*  BUN 60* 64* 63* 60* 57*  CREATININE 1.46* 1.62* 1.65* 1.58* 1.44*  CALCIUM 8.8* 8.7* 8.1* 8.1* 8.0*   GFR: Estimated Creatinine Clearance: 33 mL/min (A) (by C-G formula based on SCr of 1.44 mg/dL (H)). Liver Function Tests: Recent Labs  Lab 12/14/21 1355 12/15/21 0806 12/15/21 1155  AST '23 21 25  '$ ALT '17 16 18  '$ ALKPHOS 167* 146* 155*  BILITOT 0.7 0.7 0.8  PROT 6.1* 5.8* 5.7*  ALBUMIN 2.7* 2.6* 1.9*   No results for input(s): "LIPASE", "AMYLASE" in the last 168 hours. No results for input(s): "AMMONIA" in the last 168 hours. Coagulation Profile: No results for input(s): "INR", "PROTIME" in the last 168 hours. Cardiac Enzymes: No results for input(s): "CKTOTAL", "CKMB", "CKMBINDEX", "TROPONINI" in the last 168 hours. BNP (last 3 results) No results for input(s): "PROBNP" in the last 8760 hours. HbA1C: No results for input(s): "HGBA1C" in the last 72 hours. CBG: No results for input(s): "GLUCAP" in the last 168 hours. Lipid Profile: No results for input(s): "CHOL", "HDL", "LDLCALC", "TRIG", "CHOLHDL", "LDLDIRECT" in the last 72 hours. Thyroid Function Tests: No results for input(s): "TSH", "T4TOTAL", "FREET4", "T3FREE", "THYROIDAB" in the last 72 hours. Anemia Panel: No results for input(s): "VITAMINB12", "FOLATE", "FERRITIN", "TIBC", "IRON", "RETICCTPCT" in the last 72 hours. Sepsis Labs: Recent Labs  Lab 12/15/21 1156 12/15/21 1520  LATICACIDVEN 1.2 0.9    Recent Results (from the past 240 hour(s))  Culture, blood (routine x 2)     Status: None (Preliminary result)   Collection Time: 12/15/21 12:25 PM   Specimen: Porta Cath; Blood  Result Value Ref Range Status   Specimen Description   Final    PORTA CATH RIGHT Performed at [facility], [place] [address], [place], [place] [address]     Special Requests   Final    BOTTLES DRAWN AEROBIC AND ANAEROBIC Blood Culture adequate volume Performed at [facility], [address] [place], [place] [address]    Culture   Final    NO GROWTH < 24 HOURS Performed at [facility], [place] [address], [place], [place] [address]    Report Status PENDING  Incomplete         Radiology Studies: DG Chest Portable 1 View  Result Date: 12/15/2021 CLINICAL DATA:  Shortness of breath. Cancer patient. Chemotherapy 1 week ago. EXAM: PORTABLE CHEST 1 VIEW COMPARISON:  Two-view chest x-ray 12/07/2021 FINDINGS: Heart size exaggerated by low lung volumes. A left pleural effusion has increased significantly. Asymmetric airspace disease is present on the left. Moderate pulmonary vascular congestion is new. IMPRESSION: 1. Increased left pleural effusion. 2. Associated airspace disease in the left lung likely reflects atelectasis. 3. New pulmonary vascular congestion. Electronically Signed   By: San Morelle M.D.   On: 12/15/2021 12:47  Scheduled Meds:  antiseptic oral rinse  15 mL Mouth Rinse Q4H   atorvastatin  40 mg Oral QHS   Chlorhexidine Gluconate Cloth  6 each Topical q morning   lidocaine       pantoprazole  40 mg Oral Daily   Continuous Infusions:  albumin human     cefTRIAXone (ROCEPHIN)  IV 2 g (12/15/21 2319)   metronidazole 500 mg (12/15/21 2134)     LOS: 0 days    Time spent: 45 minutes spent on chart review, discussion with nursing staff, consultants, updating family and interview/physical exam; more than 50% of that time was spent in counseling and/or coordination of care.    Geradine Girt, DO [facility] Available via Epic secure chat 7am-7pm After these hours, please refer to coverage provider listed on [email or web address] 12/16/2021, 11:08 AM

## 2021-12-16 NOTE — Consult Note (Signed)
Maria Romero is well-known to me.  She is a wonderful 71 year old white female.  She has stage IIIc ovarian cancer.  We are try to treat her with neoadjuvant chemotherapy so she can get to surgery.  She had a first cycle of carboplatinum/Taxol last week.  She really has had a miserable time with this.  She had a lot of diarrhea.  Some of this may be from the fact that she has some constipation before the diarrhea only took some laxatives.  She has been in the office 2 days in a row.  We saw her on Tuesday and she was incredibly dehydrated.  She is very weak.  We gave her IV fluids.  She improved a little bit.  However, on Wednesday, she fell a couple times.  She fell getting out of the car to come to the office.  It was apparent that she just was in no shape to be able to go home.  She was also quite neutropenic.  This, I am surprised by, because the regimen that we use really is not that significant for causing neutropenia.  We had to admit her.  She has had no fever.  We did give her a dose of Neulasta in the clinic on Tuesday.  She has had multiple paracenteses.  When she was admitted, she had a chest x-ray which showed increasing left pleural effusion.  I am sure this is from the ascites that is by flowing up through rents in the diaphragm.  She apparently has some urinary retention.  I am unsure as to why she would be having this.  Urine culture is being sent off.  Her white cell count 0.7.  Hemoglobin 10.3.  Blood count 111,000.  Her BUN is 57 creatinine 1.44.  Her sodium is 132.  Chloride is 106.  As such, the IV fluids are seem to be helping a little bit.  She is on IV antibiotics right now.  We will have to see what cultures show.  She is not eating that much.  She just does not have much of an appetite.  She is not complaining of any pain.    Her vital signs show temperature of 97.5.  Pulse 105.  Blood pressure 113/72.  Her head neck exam shows slightly moist oral mucosa.  There is  no mucositis.  There is no adenopathy in the neck.  Lungs are clear bilaterally.  There may be some decreased breath sounds at the bases.  Cardiac exam regular rate and rhythm.  She has no murmurs, rubs or bruits.  Abdomen is somewhat distended.  There is a fluid wave.  There is no guarding or rebound tenderness.  Extremity shows no clubbing, cyanosis or edema.  Neurological exam is nonfocal.   Maria Romero has stage IIIc ovarian cancer.  She has a large right ovarian mass.  Again, we are treating her neoadjuvantly to try to "debulk her" and with that we can have surgery to remove her malignancy.  Again I am suggest surprised that she has had a tough time with treatment.  We will be interesting to see if the cultures are positive.  She is on broad-spectrum antibiotics right now.  We will see about the paracentesis.  I think she is also scheduled for a thoracentesis.  She has a neutropenia which hopefully should start improving.  We will have to watch her blood counts daily.  She does not need to be transfused as of yet.  However, I certainly would  have a low threshold for transfusing her if her hemoglobin continues to drop.  I think that once her white cell count begins to improve, then she will start to feel a whole lot better.  Again, I just feel bad that she has had a very difficult time with this protocol.  I know that she will get incredible care from everybody up on [address].  I appreciate everybody's compassion and professional approach.   Lattie Haw, MD  Darlyn Chamber 29:11

## 2021-12-16 NOTE — Progress Notes (Signed)
Patient admitted to the hospital for dehydration, marked weakness and neutropenia. Will continue to follow for post discharge needs and office follow up.  Oncology Nurse Navigator Documentation     12/16/2021    8:45 AM  Oncology Nurse Navigator Flowsheets  Navigator Follow Up Date: 12/20/2021  Navigator Follow Up Reason: Appointment Review  Navigator Location [facility]  Navigator Encounter Type Appt/Treatment Plan Review  Patient Visit Type MedOnc  Treatment Phase Active Tx  Barriers/Navigation Needs Coordination of Care;Education  Interventions None Required  Acuity Level 2-Minimal Needs (1-2 Barriers Identified)  Support Groups/Services Friends and Family  Time Spent with Patient 15

## 2021-12-16 NOTE — Telephone Encounter (Signed)
Spoke with [name] (patient's daughter) and relayed that I was rescheduling her appointment for after discharge.  [name] is a Pharmacist, hospital, and I told her I would be happy to reschedule late in afternoon or early evening when she and patient could be together to talk.  April Manson, RDN, LDN Registered Dietitian, [place] Part Time Remote (Usual office hours: Tuesday-Thursday) Mobile: [phone] Remote Office: [phone]

## 2021-12-17 ENCOUNTER — Inpatient Hospital Stay (HOSPITAL_COMMUNITY): Payer: Medicare Other

## 2021-12-17 ENCOUNTER — Ambulatory Visit (HOSPITAL_COMMUNITY)
Admission: RE | Admit: 2021-12-17 | Discharge: 2021-12-17 | Disposition: A | Discharge status: Home / Self Care | Payer: Medicare Other | Source: Ambulatory Visit | Attending: Hematology & Oncology | Admitting: Hematology & Oncology

## 2021-12-17 DIAGNOSIS — R188 Other ascites: Secondary | ICD-10-CM | POA: Diagnosis not present

## 2021-12-17 DIAGNOSIS — R197 Diarrhea, unspecified: Secondary | ICD-10-CM | POA: Diagnosis not present

## 2021-12-17 DIAGNOSIS — J9601 Acute respiratory failure with hypoxia: Secondary | ICD-10-CM | POA: Diagnosis not present

## 2021-12-17 DIAGNOSIS — D701 Agranulocytosis secondary to cancer chemotherapy: Secondary | ICD-10-CM | POA: Diagnosis not present

## 2021-12-17 DIAGNOSIS — T451X5A Adverse effect of antineoplastic and immunosuppressive drugs, initial encounter: Secondary | ICD-10-CM | POA: Diagnosis not present

## 2021-12-17 DIAGNOSIS — E440 Moderate protein-calorie malnutrition: Secondary | ICD-10-CM | POA: Insufficient documentation

## 2021-12-17 LAB — COMPREHENSIVE METABOLIC PANEL
ALT: 15 U/L (ref 0–44)
AST: 18 U/L (ref 15–41)
Albumin: 1.8 g/dL — ABNORMAL LOW (ref 3.5–5.0)
Alkaline Phosphatase: 234 U/L — ABNORMAL HIGH (ref 38–126)
Anion gap: 5 (ref 5–15)
BUN: 44 mg/dL — ABNORMAL HIGH (ref 8–23)
CO2: 18 mmol/L — ABNORMAL LOW (ref 22–32)
Calcium: 7.4 mg/dL — ABNORMAL LOW (ref 8.9–10.3)
Chloride: 107 mmol/L (ref 98–111)
Creatinine, Ser: 0.96 mg/dL (ref 0.44–1.00)
GFR, Estimated: >60 mL/min (ref >60)
Glucose, Bld: 99 mg/dL (ref 70–99)
Potassium: 4.2 mmol/L (ref 3.5–5.1)
Sodium: 130 mmol/L — ABNORMAL LOW (ref 135–145)
Total Bilirubin: 0.7 mg/dL (ref 0.3–1.2)
Total Protein: 4.3 g/dL — ABNORMAL LOW (ref 6.5–8.1)

## 2021-12-17 LAB — CBC WITH DIFFERENTIAL/PLATELET
Abs Immature Granulocytes: 0.02 10*3/uL (ref 0.00–0.07)
Basophils Absolute: 0.1 10*3/uL (ref 0.0–0.1)
Basophils Relative: 1 %
Eosinophils Absolute: 0 10*3/uL (ref 0.0–0.5)
Eosinophils Relative: 1 %
HCT: 29.8 % — ABNORMAL LOW (ref 36.0–46.0)
Hemoglobin: 10 g/dL — ABNORMAL LOW (ref 12.0–15.0)
Immature Granulocytes: 1 %
Lymphocytes Relative: 14 %
Lymphs Abs: 0.5 10*3/uL — ABNORMAL LOW (ref 0.7–4.0)
MCH: 29.7 pg (ref 26.0–34.0)
MCHC: 33.6 g/dL (ref 30.0–36.0)
MCV: 88.4 fL (ref 80.0–100.0)
Monocytes Absolute: 0.2 10*3/uL (ref 0.1–1.0)
Monocytes Relative: 7 %
Neutro Abs: 2.7 10*3/uL (ref 1.7–7.7)
Neutrophils Relative %: 76 %
Platelets: 99 10*3/uL — ABNORMAL LOW (ref 150–400)
RBC: 3.37 MIL/uL — ABNORMAL LOW (ref 3.87–5.11)
RDW: 13.7 % (ref 11.5–15.5)
WBC: 3.6 10*3/uL — ABNORMAL LOW (ref 4.0–10.5)
nRBC: 0 % (ref 0.0–0.2)

## 2021-12-17 LAB — C DIFFICILE QUICK SCREEN W PCR REFLEX
C Diff antigen: NEGATIVE
C Diff interpretation: NOT DETECTED
C Diff toxin: NEGATIVE

## 2021-12-17 LAB — GLUCOSE, CAPILLARY: Glucose-Capillary: 126 mg/dL — ABNORMAL HIGH (ref 70–99)

## 2021-12-17 MED ORDER — LIDOCAINE HCL 1 % IJ SOLN
INTRAMUSCULAR | Status: AC
  Filled 2021-12-17: qty 20

## 2021-12-17 MED ORDER — ENOXAPARIN SODIUM 40 MG/0.4ML IJ SOSY
40.0000 mg | PREFILLED_SYRINGE | INTRAMUSCULAR | Status: DC
  Administered 2021-12-18 – 2022-01-02 (×16): 40 mg via SUBCUTANEOUS
  Filled 2021-12-17 (×16): qty 0.4

## 2021-12-17 MED ORDER — ALBUMIN HUMAN 25 % IV SOLN
12.5000 g | Freq: Four times a day (QID) | INTRAVENOUS | Status: AC
  Administered 2021-12-17 – 2021-12-18 (×4): 12.5 g via INTRAVENOUS
  Filled 2021-12-17 (×4): qty 50

## 2021-12-17 MED ORDER — ADULT MULTIVITAMIN W/MINERALS CH
1.0000 | ORAL_TABLET | Freq: Every day | ORAL | Status: DC
  Administered 2021-12-17 – 2022-01-02 (×8): 1 via ORAL
  Filled 2021-12-17 (×15): qty 1

## 2021-12-17 MED ORDER — MEGESTROL ACETATE 400 MG/10ML PO SUSP
400.0000 mg | Freq: Two times a day (BID) | ORAL | Status: DC
  Administered 2021-12-17 – 2022-01-03 (×35): 400 mg via ORAL
  Filled 2021-12-17 (×36): qty 10

## 2021-12-17 MED ORDER — BOOST / RESOURCE BREEZE PO LIQD CUSTOM
1.0000 | Freq: Three times a day (TID) | ORAL | Status: DC
  Administered 2021-12-17 – 2022-01-01 (×23): 1 via ORAL

## 2021-12-17 MED ORDER — ENOXAPARIN SODIUM 40 MG/0.4ML IJ SOSY: 40.0000 mg | PREFILLED_SYRINGE | INTRAMUSCULAR | Status: DC

## 2021-12-17 MED ORDER — BANATROL TF EN LIQD
60.0000 mL | Freq: Two times a day (BID) | ENTERAL | Status: DC
  Administered 2021-12-17 – 2022-01-02 (×9): 60 mL via ORAL
  Filled 2021-12-17 (×36): qty 60

## 2021-12-17 NOTE — Plan of Care (Signed)
Plan of care reviewed with the patient and patient expressed an understanding Problem: Education: Goal: Knowledge of General Education information will improve Description: Including pain rating scale, medication(s)/side effects and non-pharmacologic comfort measures Outcome: Progressing   Problem: Education: Goal: Knowledge of General Education information will improve Description: Including pain rating scale, medication(s)/side effects and non-pharmacologic comfort measures Outcome: Progressing   Problem: Health Behavior/Discharge Planning: Goal: Ability to manage health-related needs will improve Outcome: Progressing   Problem: Health Behavior/Discharge Planning: Goal: Ability to manage health-related needs will improve Outcome: Progressing   Problem: Clinical Measurements: Goal: Ability to maintain clinical measurements within normal limits will improve Outcome: Progressing Goal: Will remain free from infection Outcome: Progressing Goal: Diagnostic test results will improve Outcome: Progressing Goal: Respiratory complications will improve Outcome: Progressing Goal: Cardiovascular complication will be avoided Outcome: Progressing   Problem: Clinical Measurements: Goal: Ability to maintain clinical measurements within normal limits will improve Outcome: Progressing   Problem: Clinical Measurements: Goal: Will remain free from infection Outcome: Progressing   Problem: Clinical Measurements: Goal: Diagnostic test results will improve Outcome: Progressing   Problem: Clinical Measurements: Goal: Respiratory complications will improve Outcome: Progressing   Problem: Clinical Measurements: Goal: Cardiovascular complication will be avoided Outcome: Progressing

## 2021-12-17 NOTE — Progress Notes (Signed)
PT Cancellation Note  Patient Details Name: Maria Romero MRN: [id number] DOB: December 17, 1950   Cancelled Treatment:    Reason Eval/Treat Not Completed: Other (comment) (NT in room cleaning pt.  Will return as able.)   Alvira Philips 12/17/2021, 1:06 PM Emmaly Leech M,PT Acute Rehab Services [phone]

## 2021-12-17 NOTE — Evaluation (Signed)
Physical Therapy Evaluation Patient Details Name: Maria Romero MRN: [id number] DOB: 12-Oct-1950 Today's Date: 12/17/2021  History of Present Illness  LAVELL RIDINGS is a 71 y.o. female admitted 9/6 who presents with increasing weakness and persistent diarrhea. could not tolerate thoracentesis Fri 9/8 due to hypotension.  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 Pt was able to participate in some UE and LE exercises while in bed.  BP still low and desaturates with 5 reps of exercises needing incr rest breaks.  Pt completed low level evaluation overall.  Pt desires to go home with daughter and cousin's assist and if that is arranged, pt can go home with HHPT. If they cannot assist pt, she may need SNF.  Will continue PT.  Pt will also benefit from obtaining a rollator and wheelchair for mobility in and out of house.   Pt currently with functional limitations due to the deficits listed below (see PT Problem List). Pt will benefit from skilled PT to increase their independence and safety with mobility to allow discharge to the venue listed below.          Recommendations for follow up therapy are one component of a multi-disciplinary discharge planning process, led by the attending physician.  Recommendations may be updated based on patient status, additional functional criteria and insurance authorization.  Follow Up Recommendations Home health PT (as long as cousin and daughter will still be helping)      Assistance Recommended at Discharge Frequent or constant Supervision/Assistance  Patient can return home with the following  A little help with walking and/or transfers;A little help with bathing/dressing/bathroom;Assistance with cooking/housework;Direct supervision/assist for medications management;Direct supervision/assist for financial management;Assist  for transportation;Help with stairs or ramp for entrance    Equipment Recommendations Rollator (4 wheels);Wheelchair (measurements PT);Wheelchair cushion (measurements PT)  Recommendations for Other Services       Functional Status Assessment Patient has had a recent decline in their functional status and demonstrates the ability to make significant improvements in function in a reasonable and predictable amount of time.     Precautions / Restrictions Precautions Precautions: Fall Restrictions Weight Bearing Restrictions: No      Mobility  Bed Mobility               General bed mobility comments: Unable to come to EOB due to low BP and desaturation with exercise only.  Did assist pt with scooting up in bed with total assist with use of pad.    Transfers                        Ambulation/Gait                  Stairs            Wheelchair Mobility    Modified Rankin (Stroke Patients Only)       Balance                                             Pertinent Vitals/Pain Pain Assessment Pain Assessment: No/denies pain    Home Living Family/patient expects to be discharged to:: Private residence Living Arrangements: Children;Other relatives Available Help at Discharge: Family;Available 24 hours/day (had been helping a week or so  PTA) Type of Home: House Home Access: Level entry       Home Layout: Two level;Able to live on main level with bedroom/bathroom Home Equipment: BSC/3in1;Shower seat;Grab bars - tub/shower      Prior Function               Mobility Comments: Daughter and cousin assisted standing by and pt held walls and furniture ADLs Comments: Assist recently with bathing and dressing     Hand Dominance        Extremity/Trunk Assessment   Upper Extremity Assessment Upper Extremity Assessment: Defer to OT evaluation    Lower Extremity Assessment Lower Extremity Assessment: Generalized  weakness       Communication   Communication: No difficulties  Cognition Arousal/Alertness: Awake/alert Behavior During Therapy: WFL for tasks assessed/performed Overall Cognitive Status: Within Functional Limits for tasks assessed                                          General Comments General comments (skin integrity, edema, etc.): 96 % 2L, 82/62; desaturates to mid 80's with >5 reps of UE exercise therefore told pt to take rest breaks every 5 exercises.    Exercises General Exercises - Upper Extremity Shoulder Flexion: AROM, Both, 5 reps, Supine (x 2 with rest breaks) Shoulder ADduction: AROM, Both, 10 reps, Supine (rest breaks after 5 reps) Elbow Flexion: AROM, Both, 10 reps, Supine Elbow Extension: AROM, Both, 10 reps, Supine General Exercises - Lower Extremity Ankle Circles/Pumps: AROM, Both, 10 reps, Supine Quad Sets: AROM, Both, 5 reps, Supine (needs rest breaks every 5 exercises) Heel Slides: AROM, Both, 5 reps, Supine (rest breaks every 5 reps) Hip ABduction/ADduction: AROM, Both, 5 reps, Supine (rest breaks every 5 reps)   Assessment/Plan    PT Assessment Patient needs continued PT services  PT Problem List Decreased strength;Decreased range of motion;Decreased activity tolerance;Decreased balance;Decreased mobility;Decreased knowledge of use of DME;Decreased safety awareness;Decreased knowledge of precautions;Cardiopulmonary status limiting activity       PT Treatment Interventions DME instruction;Gait training;Functional mobility training;Therapeutic activities;Therapeutic exercise;Balance training;Patient/family education    PT Goals (Current goals can be found in the Care Plan section)  Acute Rehab PT Goals Patient Stated Goal: to go home PT Goal Formulation: With patient Time For Goal Achievement: 12/31/21 Potential to Achieve Goals: Good    Frequency Min 3X/week     Co-evaluation               AM-PAC PT "6 Clicks" Mobility   Outcome Measure Help needed turning from your back to your side while in a flat bed without using bedrails?: A Lot Help needed moving from lying on your back to sitting on the side of a flat bed without using bedrails?: A Lot Help needed moving to and from a bed to a chair (including a wheelchair)?: Total Help needed standing up from a chair using your arms (e.g., wheelchair or bedside chair)?: Total Help needed to walk in hospital room?: Total Help needed climbing 3-5 steps with a railing? : Total 6 Click Score: 8    End of Session   Activity Tolerance: Patient limited by fatigue Patient left: in bed;with call bell/phone within reach;with bed alarm set Nurse Communication: Mobility status PT Visit Diagnosis: Muscle weakness (generalized) (M62.81)    Time: 4696-2952 PT Time Calculation (min) (ACUTE ONLY): 23 min   Charges:   PT Evaluation $PT Eval  Low Complexity: 1 Low PT Treatments $Therapeutic Exercise: 8-22 mins        [facility] M,PT Acute Rehab Services [phone]   Alvira Philips 12/17/2021, 3:55 PM

## 2021-12-17 NOTE — Progress Notes (Signed)
  Transition of Care [facility]) Screening Note   Patient Details  Name: Maria Romero Date of Birth: March 06, 1951   [facility]) CM/SW Contact:    Roseanne Kaufman, RN Phone Number: 12/17/2021, 4:32 PM    Transition of Care Department [facility]) has reviewed patient and no TOC needs have been identified at this time. We will continue to monitor patient advancement through interdisciplinary progression rounds. If new patient transition needs arise, please place a TOC consult.

## 2021-12-17 NOTE — Progress Notes (Addendum)
Patient ID: Maria Romero, female   DOB: [birth year], 71 y.o.   MRN: [id number] Pt presented to [place] dept today for thoracentesis. Pt appears weak and has soft BP's (28'B systolic), too low to currently safely perform thoracentesis without further lowering of BP. Discussed with Dr. Eliseo Squires. Procedure put on hold for today. Can reattempt tomorrow if BP ok. Pt updated.

## 2021-12-17 NOTE — Progress Notes (Signed)
Initial Nutrition Assessment  DOCUMENTATION CODES:   Non-severe (moderate) malnutrition in context of chronic illness  INTERVENTION:   -Boost Breeze po TID, each supplement provides 250 kcal and 9 grams of protein   -Multivitamin with minerals daily  -Banatrol TF fiber supplement BID, each provides 45 kcals, 2g protein and 5g fiber.  NUTRITION DIAGNOSIS:   Moderate Malnutrition related to chronic illness, cancer and cancer related treatments as evidenced by mild fat depletion, mild muscle depletion.  GOAL:   Patient will meet greater than or equal to 90% of their needs  MONITOR:   PO intake, Supplement acceptance, Labs, Weight trends, I & O's  REASON FOR ASSESSMENT:   Consult Assessment of nutrition requirement/status  ASSESSMENT:   71 y.o. 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  9/7: s/p paracentesis, yield: 5 L  Patient in room, no family at bedside. Pt states she ate all of her soup for lunch. Still has fruit on tray. Pt states this was the first solid food she has had since a week ago. Pt was not eating much at home d/t persistent diarrhea. Diarrhea began a few days following her chemo treatment on 8/29.  Pt agreeable to trying Boost Breeze and Banatrol fiber supplement to help with diarrhea.  Per weight records, pt's weight fluctuating, likely from fluid.  Pt reports UBW is 140-141 lbs. Current weight: 153 lbs.  Medications: Megace  Labs reviewed: Low Na   NUTRITION - FOCUSED PHYSICAL EXAM:  Flowsheet Row Most Recent Value  Orbital Region No depletion  Upper Arm Region Mild depletion  Thoracic and Lumbar Region No depletion  Buccal Region Mild depletion  Temple Region Mild depletion  Clavicle Bone Region Mild depletion  Clavicle and Acromion Bone Region Mild depletion   Scapular Bone Region Mild depletion  Dorsal Hand Mild depletion  Patellar Region No depletion  Anterior Thigh Region No depletion  Posterior Calf Region No depletion  Edema (RD Assessment) None  Hair Reviewed  Eyes Reviewed  Mouth Reviewed  Skin Reviewed       Diet Order:   Diet Order             Diet regular Room service appropriate? Yes; Fluid consistency: Thin  Diet effective now                   EDUCATION NEEDS:   No education needs have been identified at this time  Skin:  Skin Assessment: Reviewed RN Assessment  Last BM:  9/8 -type 7  Height:   Ht Readings from Last 1 Encounters:  12/15/21 '5\' 3"'$  (1.6 m)    Weight:   Wt Readings from Last 1 Encounters:  12/15/21 69.5 kg    BMI:  Body mass index is 27.14 kg/m.  Estimated Nutritional Needs:   Kcal:  1850-2050  Protein:  95-105g  Fluid:  2L/day  Clayton Bibles, MS, RD, LDN Inpatient Clinical Dietitian Contact information available via Amion

## 2021-12-17 NOTE — Progress Notes (Addendum)
PROGRESS NOTE    VANETA HAMMONTREE  SWN:[id number] DOB: 02-26-1951 DOA: 12/15/2021 PCP: Hali Marry, MD    Brief Narrative:   Maria Romero is a 71 y.o. 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   Patient was sent from oncology office today.  She had her first cycle of neoadjuvant chemotherapy with carboplatinum/Taxol on 12/07/2021 and about 3 days later begin to have persistent diarrhea.Decrease intake. No nausea or vomiting. No fever or chills.  She was seen in oncology office 12/14/2021 and given IV fluids as well as Neulasta for white cell count of 800.  She returned to oncology today because she continued to be symptomatic and weak to the point where she fell. Abdominal ascites also seem to be worsening.   S/p paracentesis with 5L removed   Assessment and Plan: * Neutropenia ([place]) Secondary to chemotherapy.  She is on neoadjuvant chemotherapy with carboplatinum/Taxol with first cycle on 12/07/2021. -Received Neulasta at oncology office 12/14/2021.   -No fever  Hypotension -IV albumin  Acute urinary retention -foley placed -attempt removal when mobile  Diarrhea Likely due to intolerance to chemotherapy.  Although this is improving with last bowel movement yesterday. -c diff negative-- abx stopped -GI pathogen panel pending  Acute respiratory failure with hypoxia (H[facility]) Requiring O2 supplement with tachynpea due to enlarging left sided pleural effusion -thoracentesis when BP can tolerate  Pleural effusion on left -thoracentesis when BP tolerated  AKI (acute kidney injury) ([place]) Creatinine of 1.65 which has progressively been increasing since a month ago at 0.93. -improved with IVF  Hyperkalemia -resolved  Hyponatremia -stable  [name], right ([place]) Follows with oncology Dr.  Marin Olp. -Had first session of neoadjuvant chemotherapy on 12/07/2021 and has been feeling unwell.   -appreciated Dr. Antonieta Pert note  Malignant ascites Last therapeutic paracentesis on 11/26/2021 with a 950 cc of peritoneal fluid removed -Abdomen is more tense and distended. -paracentesis 9/7 -5L removed  Essential hypertension Hold antihypertensive while volume depleted. -use albumin   DVT prophylaxis: enoxaparin (LOVENOX) injection 40 mg Start: 12/18/21 1000 SCDs Start: 12/15/21 2000    Code Status: Full Code   Disposition Plan:  Level of care: Progressive Status is: Inpatient Remains inpatient appropriate because: needs procedures    Consultants:  Oncology IR   Subjective: Had diarrhea this AM  Objective: Vitals:   12/17/21 0836 12/17/21 0900 12/17/21 1121 12/17/21 1145  BP: 92/65 (!) 80/57 (!) 87/57 (!) 87/64  Pulse: 98  97 94  Resp: (!) '22  18 20  '$ Temp: 97.8 F (36.6 C)   97.8 F (36.6 C)  TempSrc: Oral   Oral  SpO2: 95%  96% 99%  Weight:      Height:        Intake/Output Summary (Last 24 hours) at 12/17/2021 1222 Last data filed at 12/16/2021 2343 Gross per 24 hour  Intake 250 ml  Output 1050 ml  Net -800 ml   Filed Weights   12/15/21 2000  Weight: 69.5 kg    Examination:    General: Appearance:     Overweight female in no acute distress   Abdomen softer  Lungs:     respirations unlabored  Heart:    Normal heart rate.   MS:   All extremities are intact.   Neurologic:   Awake, alert       Data Reviewed: I have personally reviewed  following labs and imaging studies  CBC: Recent Labs  Lab 12/14/21 1355 12/15/21 0806 12/15/21 1155 12/16/21 0336 12/17/21 0355  WBC 0.8* 0.5* 0.7* 0.7* 3.6*  NEUTROABS 0.1* 0.0* 0.1*  --  2.7  HGB 12.6 11.6* 11.3* 10.3* 10.0*  HCT 37.9 34.3* 33.1* 31.3* 29.8*  MCV 86.7 86.8 84.9 88.9 88.4  PLT 138* 124* 120* 111* 99*   Basic Metabolic Panel: Recent Labs  Lab 12/15/21 0806 12/15/21 1155  12/15/21 1944 12/16/21 0336 12/17/21 0355  NA 125* 126* 129* 132* 130*  K 5.2* 5.6* 5.8* 4.6 4.2  CL 97* 99 103 106 107  CO2 18* 16* 15* 16* 18*  GLUCOSE 141* 119* 88 105* 99  BUN 64* 63* 60* 57* 44*  CREATININE 1.62* 1.65* 1.58* 1.44* 0.96  CALCIUM 8.7* 8.1* 8.1* 8.0* 7.4*   GFR: Estimated Creatinine Clearance: 49.5 mL/min (by C-G formula based on SCr of 0.96 mg/dL). Liver Function Tests: Recent Labs  Lab 12/14/21 1355 12/15/21 0806 12/15/21 1155 12/17/21 0355  AST '23 21 25 18  '$ ALT '17 16 18 15  '$ ALKPHOS 167* 146* 155* 234*  BILITOT 0.7 0.7 0.8 0.7  PROT 6.1* 5.8* 5.7* 4.3*  ALBUMIN 2.7* 2.6* 1.9* 1.8*   No results for input(s): "LIPASE", "AMYLASE" in the last 168 hours. No results for input(s): "AMMONIA" in the last 168 hours. Coagulation Profile: No results for input(s): "INR", "PROTIME" in the last 168 hours. Cardiac Enzymes: No results for input(s): "CKTOTAL", "CKMB", "CKMBINDEX", "TROPONINI" in the last 168 hours. BNP (last 3 results) No results for input(s): "PROBNP" in the last 8760 hours. HbA1C: No results for input(s): "HGBA1C" in the last 72 hours. CBG: No results for input(s): "GLUCAP" in the last 168 hours. Lipid Profile: No results for input(s): "CHOL", "HDL", "LDLCALC", "TRIG", "CHOLHDL", "LDLDIRECT" in the last 72 hours. Thyroid Function Tests: No results for input(s): "TSH", "T4TOTAL", "FREET4", "T3FREE", "THYROIDAB" in the last 72 hours. Anemia Panel: No results for input(s): "VITAMINB12", "FOLATE", "FERRITIN", "TIBC", "IRON", "RETICCTPCT" in the last 72 hours. Sepsis Labs: Recent Labs  Lab 12/15/21 1156 12/15/21 1520  LATICACIDVEN 1.2 0.9    Recent Results (from the past 240 hour(s))  Culture, blood (routine x 2)     Status: None (Preliminary result)   Collection Time: 12/15/21 12:25 PM   Specimen: Porta Cath; Blood  Result Value Ref Range Status   Specimen Description   Final    PORTA CATH RIGHT Performed at [facility], [place] [address], [place], [place] [address]    Special Requests   Final    BOTTLES DRAWN AEROBIC AND ANAEROBIC Blood Culture adequate volume Performed at [facility], [address] [place], [place] [address]    Culture   Final    NO GROWTH 2 DAYS Performed at [facility], [place] [address], [place], [place] [address]    Report Status PENDING  Incomplete  C Difficile Quick Screen w PCR reflex     Status: None   Collection Time: 12/17/21  6:52 AM   Specimen: Stool  Result Value Ref Range Status   C Diff antigen NEGATIVE NEGATIVE Final   C Diff toxin NEGATIVE NEGATIVE Final   C Diff interpretation No C. difficile detected.  Final    Comment: Performed at [facility], [place] [address], [place], [place] [address]         Radiology Studies: US Paracentesis  Result Date: 12/16/2021 INDICATION: Patient with history of ovarian cancer, recurrent  ascites; request received for therapeutic paracentesis EXAM: ULTRASOUND GUIDED THERAPEUTIC PARACENTESIS MEDICATIONS: 8 ml 1% lidocaine COMPLICATIONS: None immediate. PROCEDURE: Informed written consent was obtained from the patient after a discussion of the risks, benefits and alternatives to treatment. A timeout was performed prior to the initiation of the procedure. Initial ultrasound scanning demonstrates a large amount of ascites within the right lower abdominal quadrant. The right lower abdomen was prepped and draped in the usual sterile fashion. 1% lidocaine was used for local anesthesia. Following this, a 19 gauge, 7-cm, Yueh catheter was introduced. An ultrasound image was saved for documentation purposes. The paracentesis was performed. The catheter was removed and a dressing was applied. The patient tolerated the procedure well without immediate post procedural complication. Albumin administration will be ordered by primary care team FINDINGS: A total of approximately 5 liters of golden yellow fluid was removed. IMPRESSION:  Successful ultrasound-guided therapeutic paracentesis yielding 5 liters of peritoneal fluid. Read by: Rowe Robert, PA-C Electronically Signed   By: Markus Daft M.D.   On: 12/16/2021 15:03   DG Chest Portable 1 View  Result Date: 12/15/2021 CLINICAL DATA:  Shortness of breath. Cancer patient. Chemotherapy 1 week ago. EXAM: PORTABLE CHEST 1 VIEW COMPARISON:  Two-view chest x-ray 12/07/2021 FINDINGS: Heart size exaggerated by low lung volumes. A left pleural effusion has increased significantly. Asymmetric airspace disease is present on the left. Moderate pulmonary vascular congestion is new. IMPRESSION: 1. Increased left pleural effusion. 2. Associated airspace disease in the left lung likely reflects atelectasis. 3. New pulmonary vascular congestion. Electronically Signed   By: San Morelle M.D.   On: 12/15/2021 12:47        Scheduled Meds:  antiseptic oral rinse  15 mL Mouth Rinse Q4H   atorvastatin  40 mg Oral QHS   Chlorhexidine Gluconate Cloth  6 each Topical q morning   [START ON 12/18/2021] enoxaparin (LOVENOX) injection  40 mg Subcutaneous Q24H   lidocaine       megestrol  400 mg Oral BID   pantoprazole  40 mg Oral Daily   Continuous Infusions:  albumin human       LOS: 1 day    Time spent: 45 minutes spent on chart review, discussion with nursing staff, consultants, updating family and interview/physical exam; more than 50% of that time was spent in counseling and/or coordination of care.    Geradine Girt, DO [facility] Available via Epic secure chat 7am-7pm After these hours, please refer to coverage provider listed on [email or web address] 12/17/2021, 12:22 PM

## 2021-12-17 NOTE — Progress Notes (Signed)
Finally, Ms. Maria Romero's white cell count is coming up.  It was 700 to 3600 in 1 day.  Hopefully this is an indicator that everything will start to get better for her.  She did have some diarrhea last night.  This is being sent for culture.  Hopefully she does not have C. difficile.  Her blood cultures were all negative.  She had 5 L of ascites removed yesterday.  I am just little disappointed that she has some fluid taken out.  I would think that by now, there should be a decrease in the amount of fluid being produced.  She is post to have a thoracentesis today.  I suspect that there may not be a lot of fluid in the lung.  Her renal function has improved.  Her BUN is 44 creatinine 0.96.  Calcium 7.4 with an albumin of 1.8.  Her sodium is 130 with a potassium of 4.2.  She really still is not eating much.  I think this is can be an issue for [place] going forward.  She has had no obvious bleeding.  She has had no fever.  She has had no rashes.  Her vital signs show temperature of 97.8.  Pulse 96.  Blood pressure 103/63.  Her lungs sound clear bilaterally.  Cardiac exam regular rate and rhythm.  She has no murmurs.  Abdomen is soft.  There is no obvious fluid wave.  She has no guarding or rebound tenderness.  Bowel sounds may be down a little bit.  There is no obvious abdominal mass.  Extremity shows no clubbing, cyanosis or edema.  Ms. Maria Romero really had a tough time with the first cycle of chemotherapy.  We have had to make some serious adjustments with her protocol.  At least, her neutropenia has resolved.  Hopefully, we can probably pull back on antibiotics on her.  I would keep the Flagyl is for right now until we know what the C. difficile is.  Again nutritions can be critical.  I do not know if and get nutrition into see her to try to help out.  Looks like she may have an outpatient appointment with dietary medicine.  Hopefully, she will start feeling stronger know that her white cell count has  come back.  Maybe, if all goes well, she will be able to go home over the weekend depending on her nutritional state and diarrhea.  I know that she has been incredibly impressed with the staff on [address].  I am really not surprised by this.  Lattie Haw, MD  [place] 10:27

## 2021-12-18 DIAGNOSIS — T451X5A Adverse effect of antineoplastic and immunosuppressive drugs, initial encounter: Secondary | ICD-10-CM | POA: Diagnosis not present

## 2021-12-18 DIAGNOSIS — D701 Agranulocytosis secondary to cancer chemotherapy: Secondary | ICD-10-CM | POA: Diagnosis not present

## 2021-12-18 DIAGNOSIS — E860 Dehydration: Secondary | ICD-10-CM | POA: Diagnosis not present

## 2021-12-18 DIAGNOSIS — I959 Hypotension, unspecified: Secondary | ICD-10-CM | POA: Diagnosis not present

## 2021-12-18 DIAGNOSIS — R197 Diarrhea, unspecified: Secondary | ICD-10-CM | POA: Diagnosis not present

## 2021-12-18 LAB — GASTROINTESTINAL PANEL BY PCR, STOOL (REPLACES STOOL CULTURE)

## 2021-12-18 LAB — CBC WITH DIFFERENTIAL/PLATELET
Abs Immature Granulocytes: 0.22 10*3/uL — ABNORMAL HIGH (ref 0.00–0.07)
Basophils Absolute: 0 10*3/uL (ref 0.0–0.1)
Basophils Relative: 0 %
Eosinophils Absolute: 0 10*3/uL (ref 0.0–0.5)
Eosinophils Relative: 0 %
HCT: 32.4 % — ABNORMAL LOW (ref 36.0–46.0)
Hemoglobin: 10.4 g/dL — ABNORMAL LOW (ref 12.0–15.0)
Immature Granulocytes: 2 %
Lymphocytes Relative: 6 %
Lymphs Abs: 0.6 10*3/uL — ABNORMAL LOW (ref 0.7–4.0)
MCH: 28.6 pg (ref 26.0–34.0)
MCHC: 32.1 g/dL (ref 30.0–36.0)
MCV: 89 fL (ref 80.0–100.0)
Monocytes Absolute: 0.5 10*3/uL (ref 0.1–1.0)
Monocytes Relative: 4 %
Neutro Abs: 9.3 10*3/uL — ABNORMAL HIGH (ref 1.7–7.7)
Neutrophils Relative %: 88 %
Platelets: 92 10*3/uL — ABNORMAL LOW (ref 150–400)
RBC: 3.64 MIL/uL — ABNORMAL LOW (ref 3.87–5.11)
RDW: 13.9 % (ref 11.5–15.5)
WBC: 10.7 10*3/uL — ABNORMAL HIGH (ref 4.0–10.5)
nRBC: 0 % (ref 0.0–0.2)

## 2021-12-18 LAB — COMPREHENSIVE METABOLIC PANEL
ALT: 12 U/L (ref 0–44)
AST: 17 U/L (ref 15–41)
Albumin: 2.3 g/dL — ABNORMAL LOW (ref 3.5–5.0)
Alkaline Phosphatase: 164 U/L — ABNORMAL HIGH (ref 38–126)
Anion gap: 6 (ref 5–15)
BUN: 40 mg/dL — ABNORMAL HIGH (ref 8–23)
CO2: 18 mmol/L — ABNORMAL LOW (ref 22–32)
Calcium: 7.8 mg/dL — ABNORMAL LOW (ref 8.9–10.3)
Chloride: 110 mmol/L (ref 98–111)
Creatinine, Ser: 0.99 mg/dL (ref 0.44–1.00)
GFR, Estimated: >60 mL/min (ref >60)
Glucose, Bld: 136 mg/dL — ABNORMAL HIGH (ref 70–99)
Potassium: 3.9 mmol/L (ref 3.5–5.1)
Sodium: 134 mmol/L — ABNORMAL LOW (ref 135–145)
Total Bilirubin: 0.7 mg/dL (ref 0.3–1.2)
Total Protein: 4.9 g/dL — ABNORMAL LOW (ref 6.5–8.1)

## 2021-12-18 LAB — PREALBUMIN: Prealbumin: 6 mg/dL — ABNORMAL LOW (ref 18–38)

## 2021-12-18 MED ORDER — DIPHENOXYLATE-ATROPINE 2.5-0.025 MG PO TABS
1.0000 | ORAL_TABLET | Freq: Four times a day (QID) | ORAL | Status: DC | PRN
  Administered 2021-12-18 – 2021-12-22 (×6): 1 via ORAL
  Filled 2021-12-18 (×6): qty 1

## 2021-12-18 MED ORDER — ALUM & MAG HYDROXIDE-SIMETH 200-200-20 MG/5ML PO SUSP
30.0000 mL | Freq: Four times a day (QID) | ORAL | Status: DC | PRN
Start: 2021-12-18 — End: 2022-01-04
  Administered 2021-12-18: 30 mL via ORAL
  Filled 2021-12-18: qty 30

## 2021-12-18 MED ORDER — FLUDROCORTISONE ACETATE 0.1 MG PO TABS
0.2000 mg | ORAL_TABLET | Freq: Every day | ORAL | Status: DC
  Administered 2021-12-18 – 2021-12-19 (×2): 0.2 mg via ORAL
  Filled 2021-12-18 (×2): qty 2

## 2021-12-18 MED ORDER — LOPERAMIDE HCL 2 MG PO CAPS
2.0000 mg | ORAL_CAPSULE | Freq: Once | ORAL | Status: AC
  Administered 2021-12-18: 2 mg via ORAL
  Filled 2021-12-18: qty 1

## 2021-12-18 NOTE — Progress Notes (Signed)
Interventional Radiology Brief Note:  IR following for possible thoracentesis due to pleural effusion.  Patient s/p paracentesis 9/7 with 5.0 liters removed, now with persistent hypotension since procedure. She has been supported with 2L [place] for the past 12-24 hrs, but has no progressive shortness of breath and is maintaining O2 sats on current regimen.  Will continue to hold on thoracentesis for now unless patient becomes increasingly symptomatic.   Brynda Greathouse, MS RD PA-C

## 2021-12-18 NOTE — Progress Notes (Signed)
PROGRESS NOTE    Maria Romero  BPZ:[id number] DOB: 12-11-1950 DOA: 12/15/2021 PCP: Hali Marry, MD    Brief Narrative:   Maria Romero is a 71 y.o. 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   Patient was sent from oncology office today.  She had her first cycle of neoadjuvant chemotherapy with carboplatinum/Taxol on 12/07/2021 and about 3 days later begin to have persistent diarrhea.Decrease intake. No nausea or vomiting. No fever or chills.  She was seen in oncology office 12/14/2021 and given IV fluids as well as Neulasta for white cell count of 800.  She returned to oncology today because she continued to be symptomatic and weak to the point where she fell. Abdominal ascites also seem to be worsening.   S/p paracentesis with 5L removed Thoracentesis on hold until BP recovers   Assessment and Plan: * Neutropenia ([place]) Secondary to chemotherapy.  She is on neoadjuvant chemotherapy with carboplatinum/Taxol with first cycle on 12/07/2021. -Received Neulasta at oncology office 12/14/2021.   -No fever  Hypotension -IV albumin -appears florinef was added on 9/9  Acute urinary retention -foley placed -attempt removal when mobile  Diarrhea Likely due to intolerance to chemotherapy.  Although this is improving with last bowel movement yesterday. -c diff negative-- abx stopped -GI pathogen panel pending -imodium x 1  Acute respiratory failure with hypoxia (HCC) Requiring O2 supplement with tachynpea due to left sided pleural effusion -thoracentesis when BP can tolerate  Pleural effusion on left -thoracentesis when BP tolerated  AKI (acute kidney injury) ([place]) Creatinine of 1.65 which has progressively been increasing since a month ago at 0.93. -improved with  IVF  Hyperkalemia -resolved  Hyponatremia -stable  [name], right ([place]) Follows with oncology Dr. Marin Olp. -Had first session of neoadjuvant chemotherapy on 12/07/2021 and has been feeling unwell.   -appreciated Dr. Antonieta Pert note  Malignant ascites Last therapeutic paracentesis on 11/26/2021 with a 950 cc of peritoneal fluid removed -Abdomen is more tense and distended. -paracentesis 9/7 -5L removed  Essential hypertension Hold antihypertensive while volume depleted. -use albumin   DVT prophylaxis: enoxaparin (LOVENOX) injection 40 mg Start: 12/18/21 1000 SCDs Start: 12/15/21 2000    Code Status: Full Code   Disposition Plan:  Level of care: Progressive Status is: Inpatient Remains inpatient appropriate because: needs procedures    Consultants:  Oncology IR   Subjective: Another bout of diarrhea this AM  Objective: Vitals:   12/17/21 1606 12/17/21 2015 12/18/21 0559 12/18/21 0956  BP: 91/61 (!) '84/61 96/66 91/60 '$  Pulse: 97 98 91 97  Resp: '20 19 18   '$ Temp: 97.6 F (36.4 C) 98.2 F (36.8 C) 98 F (36.7 C)   TempSrc: Oral Oral Oral   SpO2: 98% 94% 95%   Weight:      Height:        Intake/Output Summary (Last 24 hours) at 12/18/2021 1141 Last data filed at 12/18/2021 0241 Gross per 24 hour  Intake 519.23 ml  Output 1262 ml  Net -742.77 ml   Filed Weights   12/15/21 2000  Weight: 69.5 kg    Examination:     General: Appearance:     Overweight female in no acute distress     Lungs:     respirations less labored today, diminished  Heart:    Normal heart rate. Normal rhythm. No murmurs, rubs, or gallops.  MS:   All extremities are intact.   Neurologic:   Awake, alert, oriented x 3. No apparent focal neurological           defect.          Data Reviewed: I have personally reviewed following labs and imaging studies  CBC: Recent Labs  Lab 12/14/21 1355 12/15/21 0806 12/15/21 1155 12/16/21 0336 12/17/21 0355 12/18/21 0354  WBC  0.8* 0.5* 0.7* 0.7* 3.6* 10.7*  NEUTROABS 0.1* 0.0* 0.1*  --  2.7 9.3*  HGB 12.6 11.6* 11.3* 10.3* 10.0* 10.4*  HCT 37.9 34.3* 33.1* 31.3* 29.8* 32.4*  MCV 86.7 86.8 84.9 88.9 88.4 89.0  PLT 138* 124* 120* 111* 99* 92*   Basic Metabolic Panel: Recent Labs  Lab 12/15/21 1155 12/15/21 1944 12/16/21 0336 12/17/21 0355 12/18/21 0354  NA 126* 129* 132* 130* 134*  K 5.6* 5.8* 4.6 4.2 3.9  CL 99 103 106 107 110  CO2 16* 15* 16* 18* 18*  GLUCOSE 119* 88 105* 99 136*  BUN 63* 60* 57* 44* 40*  CREATININE 1.65* 1.58* 1.44* 0.96 0.99  CALCIUM 8.1* 8.1* 8.0* 7.4* 7.8*   GFR: Estimated Creatinine Clearance: 48 mL/min (by C-G formula based on SCr of 0.99 mg/dL). Liver Function Tests: Recent Labs  Lab 12/14/21 1355 12/15/21 0806 12/15/21 1155 12/17/21 0355 12/18/21 0354  AST '23 21 25 18 17  '$ ALT '17 16 18 15 12  '$ ALKPHOS 167* 146* 155* 234* 164*  BILITOT 0.7 0.7 0.8 0.7 0.7  PROT 6.1* 5.8* 5.7* 4.3* 4.9*  ALBUMIN 2.7* 2.6* 1.9* 1.8* 2.3*   No results for input(s): "LIPASE", "AMYLASE" in the last 168 hours. No results for input(s): "AMMONIA" in the last 168 hours. Coagulation Profile: No results for input(s): "INR", "PROTIME" in the last 168 hours. Cardiac Enzymes: No results for input(s): "CKTOTAL", "CKMB", "CKMBINDEX", "TROPONINI" in the last 168 hours. BNP (last 3 results) No results for input(s): "PROBNP" in the last 8760 hours. HbA1C: No results for input(s): "HGBA1C" in the last 72 hours. CBG: Recent Labs  Lab 12/17/21 2220  GLUCAP 126*   Lipid Profile: No results for input(s): "CHOL", "HDL", "LDLCALC", "TRIG", "CHOLHDL", "LDLDIRECT" in the last 72 hours. Thyroid Function Tests: No results for input(s): "TSH", "T4TOTAL", "FREET4", "T3FREE", "THYROIDAB" in the last 72 hours. Anemia Panel: No results for input(s): "VITAMINB12", "FOLATE", "FERRITIN", "TIBC", "IRON", "RETICCTPCT" in the last 72 hours. Sepsis Labs: Recent Labs  Lab 12/15/21 1156 12/15/21 1520   LATICACIDVEN 1.2 0.9    Recent Results (from the past 240 hour(s))  Culture, blood (routine x 2)     Status: None (Preliminary result)   Collection Time: 12/15/21 12:25 PM   Specimen: Porta Cath; Blood  Result Value Ref Range Status   Specimen Description   Final    PORTA CATH RIGHT Performed at [facility], [place] [address] [place], [place] [address]    Special Requests   Final    BOTTLES DRAWN AEROBIC AND ANAEROBIC Blood Culture adequate volume Performed at [facility], [address] [place], [place] [address]    Culture   Final    NO GROWTH 3 DAYS Performed at [facility], [place] [address], [place], [place] [address]    Report Status PENDING  Incomplete  C Difficile Quick Screen w PCR reflex     Status: None   Collection Time: 12/17/21  6:52 AM   Specimen: Stool  Result Value Ref Range Status   C Diff  antigen NEGATIVE NEGATIVE Final   C Diff toxin NEGATIVE NEGATIVE Final   C Diff interpretation No C. difficile detected.  Final    Comment: Performed at [facility], [place] [address], [place], [place] [address]         Radiology Studies: US Paracentesis  Result Date: 12/16/2021 INDICATION: Patient with history of ovarian cancer, recurrent ascites; request received for therapeutic paracentesis EXAM: ULTRASOUND GUIDED THERAPEUTIC PARACENTESIS MEDICATIONS: 8 ml 1% lidocaine COMPLICATIONS: None immediate. PROCEDURE: Informed written consent was obtained from the patient after a discussion of the risks, benefits and alternatives to treatment. A timeout was performed prior to the initiation of the procedure. Initial ultrasound scanning demonstrates a large amount of ascites within the right lower abdominal quadrant. The right lower abdomen was prepped and draped in the usual sterile fashion. 1% lidocaine was used for local anesthesia. Following this, a 19 gauge, 7-cm, Yueh catheter was introduced. An ultrasound image was saved for  documentation purposes. The paracentesis was performed. The catheter was removed and a dressing was applied. The patient tolerated the procedure well without immediate post procedural complication. Albumin administration will be ordered by primary care team FINDINGS: A total of approximately 5 liters of golden yellow fluid was removed. IMPRESSION: Successful ultrasound-guided therapeutic paracentesis yielding 5 liters of peritoneal fluid. Read by: Rowe Robert, PA-C Electronically Signed   By: Markus Daft M.D.   On: 12/16/2021 15:03        Scheduled Meds:  antiseptic oral rinse  15 mL Mouth Rinse Q4H   Chlorhexidine Gluconate Cloth  6 each Topical q morning   enoxaparin (LOVENOX) injection  40 mg Subcutaneous Q24H   feeding supplement  1 Container Oral TID BM   fiber supplement (BANATROL TF)  60 mL Oral BID   fludrocortisone  0.2 mg Oral Daily   loperamide  2 mg Oral Once   megestrol  400 mg Oral BID   multivitamin with minerals  1 tablet Oral Daily   pantoprazole  40 mg Oral Daily   Continuous Infusions:     LOS: 2 days    Time spent: 45 minutes spent on chart review, discussion with nursing staff, consultants, updating family and interview/physical exam; more than 50% of that time was spent in counseling and/or coordination of care.    Geradine Girt, DO [facility] Available via Epic secure chat 7am-7pm After these hours, please refer to coverage provider listed on [email or web address] 12/18/2021, 11:41 AM

## 2021-12-18 NOTE — Plan of Care (Signed)
?  Problem: Clinical Measurements: ?Goal: Will remain free from infection ?Outcome: Progressing ?  ?

## 2021-12-18 NOTE — Progress Notes (Signed)
Maria Romero is still having a very slow recovery.  Now her blood pressure is not all that great.  Her blood pressure is on the lower side.  I suspect she probably is going to need some Florinef.  She cannot have the thoracentesis yesterday because of her low blood pressure.  We started her on Megace elixir to try to help with her appetite.  She says she ate better yesterday.  She had little bit of diarrhea.  Her stool was negative for C. difficile.  She is just is so weak.  I am just still amazed as to how tough of time she had with the chemotherapy.  She has had no fever.  All cultures are negative.  I think she is off antibiotics.  Her albumin is up to 2.3.  Maybe, this is a good indicator for his.  Her BUN is 40 creatinine 0.99.  Her white cell count is 10.7.  Hemoglobin 10.4.  Platelet count 92,000.  She really needs to do physical therapy.  I think they came by yesterday.  She has had no bleeding.  There is no shortness of breath.  She has a little bit of a cough.  It is nonproductive.  Her temperature is 98.2.  Pulse 98.  Blood pressure 84/61.  Oral exam is dry.  There is no mucositis.  Lungs are clear bilaterally.  She has a good air movement bilaterally.  Cardiac exam regular rate and rhythm.  Abdomen is soft.  There is no guarding or rebound tenderness.  There is no obvious abdominal mass.  There is no fluid wave.  Extremity shows no clubbing, cyanosis or edema.  Neurological exam shows no focal neurological deficits.  I just hate the fact that Maria Romero is taking so long to really recover.  I really worry about the possibility of her having any more chemotherapy.  If we do chemotherapy, we are going to have to do some serious dosage adjustments or maybe even do a different protocol.  I may have to add Avastin to her chemotherapy and we continue to have problems with fluid accumulation.  Again everything, in my mind, both to her nutritional state.  I will check a prealbumin on  her.  I know that she is getting incredible care from everybody upon [address].  I appreciate everybody's help.  Lattie Haw, MD  [place] 12:12

## 2021-12-19 ENCOUNTER — Inpatient Hospital Stay (HOSPITAL_COMMUNITY): Payer: Medicare Other

## 2021-12-19 DIAGNOSIS — D701 Agranulocytosis secondary to cancer chemotherapy: Secondary | ICD-10-CM | POA: Diagnosis not present

## 2021-12-19 DIAGNOSIS — E860 Dehydration: Secondary | ICD-10-CM | POA: Diagnosis not present

## 2021-12-19 DIAGNOSIS — T451X5A Adverse effect of antineoplastic and immunosuppressive drugs, initial encounter: Secondary | ICD-10-CM | POA: Diagnosis not present

## 2021-12-19 DIAGNOSIS — N179 Acute kidney failure, unspecified: Secondary | ICD-10-CM | POA: Diagnosis not present

## 2021-12-19 LAB — CBC WITH DIFFERENTIAL/PLATELET
Abs Immature Granulocytes: 1.29 10*3/uL — ABNORMAL HIGH (ref 0.00–0.07)
Basophils Absolute: 0 10*3/uL (ref 0.0–0.1)
Basophils Relative: 0 %
Eosinophils Absolute: 0 10*3/uL (ref 0.0–0.5)
Eosinophils Relative: 0 %
HCT: 35.9 % — ABNORMAL LOW (ref 36.0–46.0)
Hemoglobin: 11.6 g/dL — ABNORMAL LOW (ref 12.0–15.0)
Immature Granulocytes: 8 %
Lymphocytes Relative: 6 %
Lymphs Abs: 1 10*3/uL (ref 0.7–4.0)
MCH: 29 pg (ref 26.0–34.0)
MCHC: 32.3 g/dL (ref 30.0–36.0)
MCV: 89.8 fL (ref 80.0–100.0)
Monocytes Absolute: 0.7 10*3/uL (ref 0.1–1.0)
Monocytes Relative: 5 %
Neutro Abs: 12.7 10*3/uL — ABNORMAL HIGH (ref 1.7–7.7)
Neutrophils Relative %: 81 %
Platelets: 87 10*3/uL — ABNORMAL LOW (ref 150–400)
RBC: 4 MIL/uL (ref 3.87–5.11)
RDW: 14 % (ref 11.5–15.5)
WBC: 15.8 10*3/uL — ABNORMAL HIGH (ref 4.0–10.5)
nRBC: 0 % (ref 0.0–0.2)

## 2021-12-19 LAB — COMPREHENSIVE METABOLIC PANEL
ALT: 13 U/L (ref 0–44)
AST: 17 U/L (ref 15–41)
Albumin: 2.2 g/dL — ABNORMAL LOW (ref 3.5–5.0)
Alkaline Phosphatase: 190 U/L — ABNORMAL HIGH (ref 38–126)
Anion gap: 8 (ref 5–15)
BUN: 37 mg/dL — ABNORMAL HIGH (ref 8–23)
CO2: 17 mmol/L — ABNORMAL LOW (ref 22–32)
Calcium: 7.8 mg/dL — ABNORMAL LOW (ref 8.9–10.3)
Chloride: 109 mmol/L (ref 98–111)
Creatinine, Ser: 0.93 mg/dL (ref 0.44–1.00)
GFR, Estimated: >60 mL/min (ref >60)
Glucose, Bld: 139 mg/dL — ABNORMAL HIGH (ref 70–99)
Potassium: 3.7 mmol/L (ref 3.5–5.1)
Sodium: 134 mmol/L — ABNORMAL LOW (ref 135–145)
Total Bilirubin: 0.6 mg/dL (ref 0.3–1.2)
Total Protein: 5 g/dL — ABNORMAL LOW (ref 6.5–8.1)

## 2021-12-19 MED ORDER — LIDOCAINE HCL 1 % IJ SOLN
INTRAMUSCULAR | Status: AC
  Filled 2021-12-19: qty 20

## 2021-12-19 MED ORDER — MIDODRINE HCL 5 MG PO TABS
2.5000 mg | ORAL_TABLET | Freq: Three times a day (TID) | ORAL | Status: DC
  Administered 2021-12-19 – 2021-12-20 (×3): 2.5 mg via ORAL
  Filled 2021-12-19 (×3): qty 1

## 2021-12-19 MED ORDER — PANTOPRAZOLE SODIUM 40 MG PO TBEC
40.0000 mg | DELAYED_RELEASE_TABLET | Freq: Two times a day (BID) | ORAL | Status: DC
  Administered 2021-12-19 – 2022-01-03 (×30): 40 mg via ORAL
  Filled 2021-12-19 (×30): qty 1

## 2021-12-19 NOTE — Progress Notes (Signed)
24 hour chart audit completed 

## 2021-12-19 NOTE — Progress Notes (Signed)
PROGRESS NOTE    Maria Romero  MPN:[id number] DOB: [birth year] DOA: 12/15/2021 PCP: Hali Marry, MD    Brief Narrative:   Maria Romero is a 71 y.o. 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   Patient was sent from oncology office today.  She had her first cycle of neoadjuvant chemotherapy with carboplatinum/Taxol on 12/07/2021 and about 3 days later begin to have persistent diarrhea.Decrease intake. No nausea or vomiting. No fever or chills.  She was seen in oncology office 12/14/2021 and given IV fluids as well as Neulasta for white cell count of 800.  She returned to oncology today because she continued to be symptomatic and weak to the point where she fell. Abdominal ascites also seem to be worsening.   S/p paracentesis with 5L removed Thoracentesis on hold until BP recovers   Assessment and Plan: * Neutropenia ([place]) Secondary to chemotherapy.  She is on neoadjuvant chemotherapy with carboplatinum/Taxol with first cycle on 12/07/2021. -Received Neulasta at oncology office 12/14/2021.   -No fever  Hypotension -s/p IV albumin -appears florinef was added on 9/9 -gentle IVF for now as BP support but want to avoid volume overload  Acute urinary retention -foley placed -attempt removal when mobile  Diarrhea Likely due to intolerance to chemotherapy.  Although this is improving with last bowel movement yesterday. -c diff negative-- abx stopped -GI pathogen panel negative as well -imodium PRN  Acute respiratory failure with hypoxia (HCC) Requiring O2 supplement with tachynpea due to left sided pleural effusion -thoracentesis when BP can tolerate  Pleural effusion on left -thoracentesis when BP tolerated  AKI (acute kidney injury) ([place]) Creatinine of 1.65 which has progressively been  increasing since a month ago at 0.93. -improved with IVF  Hyperkalemia -resolved  Hyponatremia -stable  [name], right ([place]) Follows with oncology Dr. Marin Olp. -Had first session of neoadjuvant chemotherapy on 12/07/2021 and has been feeling unwell.   -appreciated Dr. Antonieta Pert note  Malignant ascites Last therapeutic paracentesis on 11/26/2021 with a 950 cc of peritoneal fluid removed -paracentesis 9/7 -5L removed -abdomen more tense today, may need another thoracentesis  Essential hypertension Hold antihypertensive while volume depleted. -s/p albumin  GERD -add tums/protonix -may need DG esophagus vs GI eval if continues  DVT prophylaxis: enoxaparin (LOVENOX) injection 40 mg Start: 12/18/21 1000 SCDs Start: 12/15/21 2000    Code Status: Full Code   Disposition Plan:  Level of care: Progressive Status is: Inpatient Remains inpatient appropriate because: needs procedures    Consultants:  Oncology IR   Subjective: Had GERD after soup last night  Objective: Vitals:   12/18/21 1254 12/18/21 2132 12/19/21 0658 12/19/21 0805  BP: 93/66 107/72 94/69   Pulse: 95 (!) 106 (!) 108   Resp: '18 16 16   '$ Temp: 97.7 F (36.5 C) 97.7 F (36.5 C) 98.1 F (36.7 C)   TempSrc: Oral Oral Oral   SpO2: 98% 100% 97% 97%  Weight:      Height:        Intake/Output Summary (Last 24 hours) at 12/19/2021 1059 Last data filed at 12/19/2021 0659 Gross per 24 hour  Intake 270 ml  Output 650 ml  Net -380 ml   Filed Weights   12/15/21 2000  Weight: 69.5 kg    Examination:     General: Appearance:     Overweight female in no acute distress  Lungs:     respirations less labored today, diminished  Heart:    Tachycardic. Normal rhythm. No murmurs, rubs, or gallops.   MS:   All extremities are intact.   Neurologic:   Awake, alert, oriented x 3. No apparent focal neurological           defect.          Data Reviewed: I have personally reviewed following labs and  imaging studies  CBC: Recent Labs  Lab 12/15/21 0806 12/15/21 1155 12/16/21 0336 12/17/21 0355 12/18/21 0354 12/19/21 0319  WBC 0.5* 0.7* 0.7* 3.6* 10.7* 15.8*  NEUTROABS 0.0* 0.1*  --  2.7 9.3* 12.7*  HGB 11.6* 11.3* 10.3* 10.0* 10.4* 11.6*  HCT 34.3* 33.1* 31.3* 29.8* 32.4* 35.9*  MCV 86.8 84.9 88.9 88.4 89.0 89.8  PLT 124* 120* 111* 99* 92* 87*   Basic Metabolic Panel: Recent Labs  Lab 12/15/21 1944 12/16/21 0336 12/17/21 0355 12/18/21 0354 12/19/21 0319  NA 129* 132* 130* 134* 134*  K 5.8* 4.6 4.2 3.9 3.7  CL 103 106 107 110 109  CO2 15* 16* 18* 18* 17*  GLUCOSE 88 105* 99 136* 139*  BUN 60* 57* 44* 40* 37*  CREATININE 1.58* 1.44* 0.96 0.99 0.93  CALCIUM 8.1* 8.0* 7.4* 7.8* 7.8*   GFR: Estimated Creatinine Clearance: 51.1 mL/min (by C-G formula based on SCr of 0.93 mg/dL). Liver Function Tests: Recent Labs  Lab 12/15/21 0806 12/15/21 1155 12/17/21 0355 12/18/21 0354 12/19/21 0319  AST '21 25 18 17 17  '$ ALT '16 18 15 12 13  '$ ALKPHOS 146* 155* 234* 164* 190*  BILITOT 0.7 0.8 0.7 0.7 0.6  PROT 5.8* 5.7* 4.3* 4.9* 5.0*  ALBUMIN 2.6* 1.9* 1.8* 2.3* 2.2*   No results for input(s): "LIPASE", "AMYLASE" in the last 168 hours. No results for input(s): "AMMONIA" in the last 168 hours. Coagulation Profile: No results for input(s): "INR", "PROTIME" in the last 168 hours. Cardiac Enzymes: No results for input(s): "CKTOTAL", "CKMB", "CKMBINDEX", "TROPONINI" in the last 168 hours. BNP (last 3 results) No results for input(s): "PROBNP" in the last 8760 hours. HbA1C: No results for input(s): "HGBA1C" in the last 72 hours. CBG: Recent Labs  Lab 12/17/21 2220  GLUCAP 126*   Lipid Profile: No results for input(s): "CHOL", "HDL", "LDLCALC", "TRIG", "CHOLHDL", "LDLDIRECT" in the last 72 hours. Thyroid Function Tests: No results for input(s): "TSH", "T4TOTAL", "FREET4", "T3FREE", "THYROIDAB" in the last 72 hours. Anemia Panel: No results for input(s): "VITAMINB12",  "FOLATE", "FERRITIN", "TIBC", "IRON", "RETICCTPCT" in the last 72 hours. Sepsis Labs: Recent Labs  Lab 12/15/21 1156 12/15/21 1520  LATICACIDVEN 1.2 0.9    Recent Results (from the past 240 hour(s))  Culture, blood (routine x 2)     Status: None (Preliminary result)   Collection Time: 12/15/21 12:25 PM   Specimen: Porta Cath; Blood  Result Value Ref Range Status   Specimen Description   Final    PORTA CATH RIGHT Performed at [facility], [place] [address], [place], [place] [address]    Special Requests   Final    BOTTLES DRAWN AEROBIC AND ANAEROBIC Blood Culture adequate volume Performed at [facility], [address] [place], [place] [address]    Culture   Final    NO GROWTH 4 DAYS Performed at [facility], [place] [address] [place], [place] [address]    Report Status PENDING  Incomplete  Culture, blood (routine x 2)     Status:  None (Preliminary result)   Collection Time: 12/15/21  7:44 PM   Specimen: BLOOD  Result Value Ref Range Status   Specimen Description   Final    BLOOD BLOOD LEFT ARM Performed at [place] [address] [place], [place] [address]    Special Requests   Final    BOTTLES DRAWN AEROBIC ONLY Blood Culture results may not be optimal due to an inadequate volume of blood received in culture bottles Performed at [place] [address] [place], [place] [address]    Culture   Final    NO GROWTH 4 DAYS Performed at [facility], [place] [address] [place], [place] [address]    Report Status PENDING  Incomplete  Gastrointestinal Panel by PCR , Stool     Status: None   Collection Time: 12/17/21  6:52 AM   Specimen: Stool  Result Value Ref Range Status   Campylobacter species NOT DETECTED NOT DETECTED Final   Plesimonas shigelloides NOT DETECTED NOT DETECTED Final   Salmonella species NOT DETECTED NOT DETECTED Final   Yersinia enterocolitica NOT DETECTED NOT DETECTED Final    Vibrio species NOT DETECTED NOT DETECTED Final   Vibrio cholerae NOT DETECTED NOT DETECTED Final   Enteroaggregative E coli (EAEC) NOT DETECTED NOT DETECTED Final   Enteropathogenic E coli (EPEC) NOT DETECTED NOT DETECTED Final   Enterotoxigenic E coli (ETEC) NOT DETECTED NOT DETECTED Final   Shiga like toxin producing E coli (STEC) NOT DETECTED NOT DETECTED Final   Shigella/Enteroinvasive E coli (EIEC) NOT DETECTED NOT DETECTED Final   Cryptosporidium NOT DETECTED NOT DETECTED Final   Cyclospora cayetanensis NOT DETECTED NOT DETECTED Final   Entamoeba histolytica NOT DETECTED NOT DETECTED Final   Giardia lamblia NOT DETECTED NOT DETECTED Final   Adenovirus F40/41 NOT DETECTED NOT DETECTED Final   Astrovirus NOT DETECTED NOT DETECTED Final   Norovirus GI/GII NOT DETECTED NOT DETECTED Final   Rotavirus A NOT DETECTED NOT DETECTED Final   Sapovirus (I, II, IV, and V) NOT DETECTED NOT DETECTED Final    Comment: Performed at [facility], [address] [place], [place] [address]  C Difficile Quick Screen w PCR reflex     Status: None   Collection Time: 12/17/21  6:52 AM   Specimen: Stool  Result Value Ref Range Status   C Diff antigen NEGATIVE NEGATIVE Final   C Diff toxin NEGATIVE NEGATIVE Final   C Diff interpretation No C. difficile detected.  Final    Comment: Performed at [facility], [place] [address], [place],  [address]         Radiology Studies: No results found.      Scheduled Meds:  antiseptic oral rinse  15 mL Mouth Rinse Q4H   Chlorhexidine Gluconate Cloth  6 each Topical q morning   enoxaparin (LOVENOX) injection  40 mg Subcutaneous Q24H   feeding supplement  1 Container Oral TID BM   fiber supplement (BANATROL TF)  60 mL Oral BID   fludrocortisone  0.2 mg Oral Daily   megestrol  400 mg Oral BID   multivitamin with minerals  1 tablet Oral Daily   pantoprazole  40 mg Oral Daily   Continuous Infusions:     LOS: 3 days     Time spent: 45 minutes spent on chart review, discussion with nursing staff, consultants, updating family and interview/physical exam; more than 50% of that time was spent in counseling and/or coordination of  care.    Geradine Girt, DO [facility] Available via Epic secure chat 7am-7pm After these hours, please refer to coverage provider listed on [email or web address] 12/19/2021, 10:59 AM

## 2021-12-20 ENCOUNTER — Encounter: Payer: Self-pay | Admitting: *Deleted

## 2021-12-20 DIAGNOSIS — C569 Malignant neoplasm of unspecified ovary: Secondary | ICD-10-CM | POA: Diagnosis not present

## 2021-12-20 DIAGNOSIS — N179 Acute kidney failure, unspecified: Secondary | ICD-10-CM | POA: Diagnosis not present

## 2021-12-20 DIAGNOSIS — R600 Localized edema: Secondary | ICD-10-CM

## 2021-12-20 DIAGNOSIS — R197 Diarrhea, unspecified: Secondary | ICD-10-CM | POA: Diagnosis not present

## 2021-12-20 DIAGNOSIS — E860 Dehydration: Secondary | ICD-10-CM | POA: Diagnosis not present

## 2021-12-20 DIAGNOSIS — I951 Orthostatic hypotension: Secondary | ICD-10-CM

## 2021-12-20 DIAGNOSIS — D701 Agranulocytosis secondary to cancer chemotherapy: Secondary | ICD-10-CM | POA: Diagnosis not present

## 2021-12-20 LAB — CBC WITH DIFFERENTIAL/PLATELET
Abs Immature Granulocytes: 1.36 10*3/uL — ABNORMAL HIGH (ref 0.00–0.07)
Basophils Absolute: 0 10*3/uL (ref 0.0–0.1)
Basophils Relative: 0 %
Eosinophils Absolute: 0 10*3/uL (ref 0.0–0.5)
Eosinophils Relative: 0 %
HCT: 35.9 % — ABNORMAL LOW (ref 36.0–46.0)
Hemoglobin: 11.5 g/dL — ABNORMAL LOW (ref 12.0–15.0)
Immature Granulocytes: 9 %
Lymphocytes Relative: 9 %
Lymphs Abs: 1.4 10*3/uL (ref 0.7–4.0)
MCH: 28.7 pg (ref 26.0–34.0)
MCHC: 32 g/dL (ref 30.0–36.0)
MCV: 89.5 fL (ref 80.0–100.0)
Monocytes Absolute: 1 10*3/uL (ref 0.1–1.0)
Monocytes Relative: 7 %
Neutro Abs: 11.5 10*3/uL — ABNORMAL HIGH (ref 1.7–7.7)
Neutrophils Relative %: 75 %
Platelets: 97 10*3/uL — ABNORMAL LOW (ref 150–400)
RBC: 4.01 MIL/uL (ref 3.87–5.11)
RDW: 14.2 % (ref 11.5–15.5)
WBC: 15.3 10*3/uL — ABNORMAL HIGH (ref 4.0–10.5)
nRBC: 0 % (ref 0.0–0.2)

## 2021-12-20 LAB — COMPREHENSIVE METABOLIC PANEL
ALT: 14 U/L (ref 0–44)
AST: 18 U/L (ref 15–41)
Albumin: 2 g/dL — ABNORMAL LOW (ref 3.5–5.0)
Alkaline Phosphatase: 178 U/L — ABNORMAL HIGH (ref 38–126)
Anion gap: 8 (ref 5–15)
BUN: 43 mg/dL — ABNORMAL HIGH (ref 8–23)
CO2: 17 mmol/L — ABNORMAL LOW (ref 22–32)
Calcium: 7.9 mg/dL — ABNORMAL LOW (ref 8.9–10.3)
Chloride: 105 mmol/L (ref 98–111)
Creatinine, Ser: 1.08 mg/dL — ABNORMAL HIGH (ref 0.44–1.00)
GFR, Estimated: 55 mL/min — ABNORMAL LOW (ref >60)
Glucose, Bld: 102 mg/dL — ABNORMAL HIGH (ref 70–99)
Potassium: 4.3 mmol/L (ref 3.5–5.1)
Sodium: 130 mmol/L — ABNORMAL LOW (ref 135–145)
Total Bilirubin: 0.6 mg/dL (ref 0.3–1.2)
Total Protein: 5.2 g/dL — ABNORMAL LOW (ref 6.5–8.1)

## 2021-12-20 LAB — CULTURE, BLOOD (ROUTINE X 2)
Culture: NO GROWTH
Culture: NO GROWTH
Special Requests: ADEQUATE

## 2021-12-20 MED ORDER — MIRTAZAPINE 15 MG PO TBDP
15.0000 mg | ORAL_TABLET | Freq: Every day | ORAL | Status: DC
  Administered 2021-12-20 – 2022-01-02 (×14): 15 mg via ORAL
  Filled 2021-12-20 (×17): qty 1

## 2021-12-20 MED ORDER — ALBUMIN HUMAN 25 % IV SOLN
12.5000 g | Freq: Four times a day (QID) | INTRAVENOUS | Status: AC
  Administered 2021-12-20 – 2021-12-21 (×3): 12.5 g via INTRAVENOUS
  Filled 2021-12-20 (×3): qty 50

## 2021-12-20 MED ORDER — SODIUM CHLORIDE 0.9 % IV SOLN: INTRAVENOUS | Status: DC

## 2021-12-20 MED ORDER — MIDODRINE HCL 5 MG PO TABS
5.0000 mg | ORAL_TABLET | Freq: Three times a day (TID) | ORAL | Status: DC
Start: 2021-12-20 — End: 2021-12-22
  Administered 2021-12-20 – 2021-12-22 (×6): 5 mg via ORAL
  Filled 2021-12-20 (×6): qty 1

## 2021-12-20 NOTE — Progress Notes (Signed)
PROGRESS NOTE    [name]  JJH:[id number] DOB: 04/20/1950 DOA: 12/15/2021 PCP: Hali Marry, MD    Brief Narrative:   Maria Romero is a 71 y.o. 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   Patient was sent from oncology office today.  She had her first cycle of neoadjuvant chemotherapy with carboplatinum/Taxol on 12/07/2021 and about 3 days later begin to have persistent diarrhea.Decrease intake. No nausea or vomiting. No fever or chills.  She was seen in oncology office 12/14/2021 and given IV fluids as well as Neulasta for white cell count of 800.  She returned to oncology today because she continued to be symptomatic and weak to the point where she fell. Abdominal ascites also seem to be worsening.   S/p paracentesis with 5L removed Thoracentesis on hold until BP recovers Stay complicated by orthostatic hypotension.    Assessment and Plan: * Neutropenia ([place]) Secondary to chemotherapy.  She is on neoadjuvant chemotherapy with carboplatinum/Taxol with first cycle on 12/07/2021. -Received Neulasta at oncology office 12/14/2021.   -No fever  Orthostatic Hypotension -s/p IV albumin -appears florinef was added on 9/9- will change to midodrine -gentle IVF for now as BP support but want to avoid volume overload -IVF -TED hose -recheck in AM  Acute urinary retention -foley placed -attempt removal when mobile  Diarrhea Likely due to intolerance to chemotherapy.  Although this is improving with last bowel movement yesterday. -c diff negative-- abx stopped -GI pathogen panel negative as well -imodium PRN  Acute respiratory failure with hypoxia (HCC) Requiring O2 supplement with tachynpea due to left sided pleural effusion -thoracentesis when BP can tolerate  Pleural effusion on  left -thoracentesis when BP tolerates  AKI (acute kidney injury) ([place]) Creatinine of 1.65 which has progressively been increasing since a month ago at 0.93. -improved with IVF  Hyperkalemia -resolved  Hyponatremia -stable  [name], right ([name]) Follows with oncology Dr. Marin Olp. -Had first session of neoadjuvant chemotherapy on 12/07/2021 and has been feeling unwell.   -appreciated Dr. Antonieta Pert note  Malignant ascites Last therapeutic paracentesis on 11/26/2021 with a 950 cc of peritoneal fluid removed -paracentesis 9/7 -5L removed -abdomen more tense, may need another thoracentesis  Essential hypertension Hold antihypertensive while volume depleted. -s/p albumin  GERD -add tums/protonix -may need DG esophagus vs GI eval if continues  DVT prophylaxis: enoxaparin (LOVENOX) injection 40 mg Start: 12/18/21 1000 SCDs Start: 12/15/21 2000    Code Status: Full Code   Disposition Plan:  Level of care: Progressive Status is: Inpatient Remains inpatient appropriate because: needs procedures    Consultants:  Oncology IR   Subjective: Had GERD after soup last night  Objective: Vitals:   12/19/21 2145 12/20/21 0506 12/20/21 0958 12/20/21 1100  BP: 100/72 112/76 (!) 89/65   Pulse: 97 (!) 105 (!) 108   Resp: '20 20  15  '$ Temp: 97.8 F (36.6 C) 97.6 F (36.4 C)    TempSrc: Oral Oral    SpO2: 95% 97%    Weight:      Height:        Intake/Output Summary (Last 24 hours) at 12/20/2021 1224 Last data filed at 12/20/2021 0612 Gross per 24 hour  Intake 240 ml  Output 350 ml  Net -110 ml   Filed Weights   12/15/21 2000  Weight: 69.5 kg    Examination:   General:  Appearance:     Overweight female in no acute distress, very frail appearing     Lungs:      respirations unlabored  Heart:    Tachycardic. Normal rhythm. No murmurs, rubs, or gallops.   MS:   All extremities are intact.   Neurologic:   Awake, alert      Data Reviewed: I have personally  reviewed following labs and imaging studies  CBC: Recent Labs  Lab 12/15/21 1155 12/16/21 0336 12/17/21 0355 12/18/21 0354 12/19/21 0319 12/20/21 0304  WBC 0.7* 0.7* 3.6* 10.7* 15.8* 15.3*  NEUTROABS 0.1*  --  2.7 9.3* 12.7* 11.5*  HGB 11.3* 10.3* 10.0* 10.4* 11.6* 11.5*  HCT 33.1* 31.3* 29.8* 32.4* 35.9* 35.9*  MCV 84.9 88.9 88.4 89.0 89.8 89.5  PLT 120* 111* 99* 92* 87* 97*   Basic Metabolic Panel: Recent Labs  Lab 12/16/21 0336 12/17/21 0355 12/18/21 0354 12/19/21 0319 12/20/21 0304  NA 132* 130* 134* 134* 130*  K 4.6 4.2 3.9 3.7 4.3  CL 106 107 110 109 105  CO2 16* 18* 18* 17* 17*  GLUCOSE 105* 99 136* 139* 102*  BUN 57* 44* 40* 37* 43*  CREATININE 1.44* 0.96 0.99 0.93 1.08*  CALCIUM 8.0* 7.4* 7.8* 7.8* 7.9*   GFR: Estimated Creatinine Clearance: 44 mL/min (A) (by C-G formula based on SCr of 1.08 mg/dL (H)). Liver Function Tests: Recent Labs  Lab 12/15/21 1155 12/17/21 0355 12/18/21 0354 12/19/21 0319 12/20/21 0304  AST '25 18 17 17 18  '$ ALT '18 15 12 13 14  '$ ALKPHOS 155* 234* 164* 190* 178*  BILITOT 0.8 0.7 0.7 0.6 0.6  PROT 5.7* 4.3* 4.9* 5.0* 5.2*  ALBUMIN 1.9* 1.8* 2.3* 2.2* 2.0*   No results for input(s): "LIPASE", "AMYLASE" in the last 168 hours. No results for input(s): "AMMONIA" in the last 168 hours. Coagulation Profile: No results for input(s): "INR", "PROTIME" in the last 168 hours. Cardiac Enzymes: No results for input(s): "CKTOTAL", "CKMB", "CKMBINDEX", "TROPONINI" in the last 168 hours. BNP (last 3 results) No results for input(s): "PROBNP" in the last 8760 hours. HbA1C: No results for input(s): "HGBA1C" in the last 72 hours. CBG: Recent Labs  Lab 12/17/21 2220  GLUCAP 126*   Lipid Profile: No results for input(s): "CHOL", "HDL", "LDLCALC", "TRIG", "CHOLHDL", "LDLDIRECT" in the last 72 hours. Thyroid Function Tests: No results for input(s): "TSH", "T4TOTAL", "FREET4", "T3FREE", "THYROIDAB" in the last 72 hours. Anemia Panel: No  results for input(s): "VITAMINB12", "FOLATE", "FERRITIN", "TIBC", "IRON", "RETICCTPCT" in the last 72 hours. Sepsis Labs: Recent Labs  Lab 12/15/21 1156 12/15/21 1520  LATICACIDVEN 1.2 0.9    Recent Results (from the past 240 hour(s))  Culture, blood (routine x 2)     Status: None   Collection Time: 12/15/21 12:25 PM   Specimen: Porta Cath; Blood  Result Value Ref Range Status   Specimen Description   Final    PORTA CATH RIGHT Performed at [facility], [place] [address], [place], [place] [address]    Special Requests   Final    BOTTLES DRAWN AEROBIC AND ANAEROBIC Blood Culture adequate volume Performed at [facility], [address] [address] [place], [place] [address]    Culture   Final    NO GROWTH 5 DAYS Performed at [facility], [place] [address], [place], [place] [address]    Report Status 12/20/2021 FINAL  Final  Culture, blood (routine x 2)     Status: None   Collection Time:  12/15/21  7:44 PM   Specimen: BLOOD  Result Value Ref Range Status   Specimen Description   Final    BLOOD BLOOD LEFT ARM Performed at [place] [address] [place], [place] [address]    Special Requests   Final    BOTTLES DRAWN AEROBIC ONLY Blood Culture results may not be optimal due to an inadequate volume of blood received in culture bottles Performed at [place] [address], [place], [place] [address]    Culture   Final    NO GROWTH 5 DAYS Performed at [facility], [place] [address], [place], [place] [address]    Report Status 12/20/2021 FINAL  Final  Gastrointestinal Panel by PCR , Stool     Status: None   Collection Time: 12/17/21  6:52 AM   Specimen: Stool  Result Value Ref Range Status   Campylobacter species NOT DETECTED NOT DETECTED Final   Plesimonas shigelloides NOT DETECTED NOT DETECTED Final   Salmonella species NOT DETECTED NOT DETECTED Final   Yersinia enterocolitica NOT DETECTED NOT DETECTED Final    Vibrio species NOT DETECTED NOT DETECTED Final   Vibrio cholerae NOT DETECTED NOT DETECTED Final   Enteroaggregative E coli (EAEC) NOT DETECTED NOT DETECTED Final   Enteropathogenic E coli (EPEC) NOT DETECTED NOT DETECTED Final   Enterotoxigenic E coli (ETEC) NOT DETECTED NOT DETECTED Final   Shiga like toxin producing E coli (STEC) NOT DETECTED NOT DETECTED Final   Shigella/Enteroinvasive E coli (EIEC) NOT DETECTED NOT DETECTED Final   Cryptosporidium NOT DETECTED NOT DETECTED Final   Cyclospora cayetanensis NOT DETECTED NOT DETECTED Final   Entamoeba histolytica NOT DETECTED NOT DETECTED Final   Giardia lamblia NOT DETECTED NOT DETECTED Final   Adenovirus F40/41 NOT DETECTED NOT DETECTED Final   Astrovirus NOT DETECTED NOT DETECTED Final   Norovirus GI/GII NOT DETECTED NOT DETECTED Final   Rotavirus A NOT DETECTED NOT DETECTED Final   Sapovirus (I, II, IV, and V) NOT DETECTED NOT DETECTED Final    Comment: Performed at [facility], [address] [place], [place] [address]  C Difficile Quick Screen w PCR reflex     Status: None   Collection Time: 12/17/21  6:52 AM   Specimen: Stool  Result Value Ref Range Status   C Diff antigen NEGATIVE NEGATIVE Final   C Diff toxin NEGATIVE NEGATIVE Final   C Diff interpretation No C. difficile detected.  Final    Comment: Performed at [facility], [place] [address], [place], [place] [address]         Radiology Studies: No results found.      Scheduled Meds:  antiseptic oral rinse  15 mL Mouth Rinse Q4H   Chlorhexidine Gluconate Cloth  6 each Topical q morning   enoxaparin (LOVENOX) injection  40 mg Subcutaneous Q24H   feeding supplement  1 Container Oral TID BM   fiber supplement (BANATROL TF)  60 mL Oral BID   megestrol  400 mg Oral BID   midodrine  2.5 mg Oral TID WC   mirtazapine  15 mg Oral QHS   multivitamin with minerals  1 tablet Oral Daily   pantoprazole  40 mg Oral BID   Continuous  Infusions:     LOS: 4 days    Time spent: 45 minutes spent on chart review, discussion with nursing staff, consultants, updating family and interview/physical exam; more than 50% of that time was spent in counseling  and/or coordination of care.    Geradine Girt, DO [facility] Available via Epic secure chat 7am-7pm After these hours, please refer to coverage provider listed on [email or web address] 12/20/2021, 12:24 PM

## 2021-12-20 NOTE — Progress Notes (Signed)
Patient continues to be admitted. Her recovery has been slow. Will continue to follow for post discharge needs and office follow up.   Oncology Nurse Navigator Documentation     12/20/2021    7:30 AM  Oncology Nurse Navigator Flowsheets  Navigator Follow Up Date: 12/24/2021  Navigator Follow Up Reason: Appointment Review  Navigator Location [facility]  Navigator Encounter Type Appt/Treatment Plan Review  Patient Visit Type MedOnc  Treatment Phase Active Tx  Barriers/Navigation Needs Coordination of Care;Education  Interventions None Required  Acuity Level 2-Minimal Needs (1-2 Barriers Identified)  Support Groups/Services Friends and Family  Time Spent with Patient 15

## 2021-12-20 NOTE — Progress Notes (Signed)
Maria Romero is still having incredibly slow recovery from her first chemotherapy.  Looks like there still might be some fluid, back in the abdomen.  She has not yet had a thoracentesis.  I am still worried about and when she is able to really eat.  I know she is being followed by dietary.  Was incredibly troublesome is that her prealbumin was only 6.  This typically is not a good prognostic marker.  Her BUN is 43 creatinine 1.08.  Sodium 130 potassium 4.3.  Her white cell count is 15.3.  Hemoglobin 11.5.  She really is not yet out of bed.  She is not having any diarrhea which is at least a positive.  She is not in any obvious pain.  She is on supplemental oxygen.  She has a Foley catheter in.  There is no bleeding.  Again, her performance status is not good at all.  To performance status is probably ECOG 3.  She really needs to improve her physical stay for value any further treatment on her.  Of note, the fact that she does have a HRD+ tumor.  As such, we might be able to consider a PARP inhibitor on her.  Her vital signs show temperature of 97.6.  Pulse 105.  Blood pressure 112/76.  Her lungs sound clear bilaterally.  Cardiac exam regular rate and rhythm.  Abdomen is slightly more distended.  Bowel sounds are decreased but present.  There is no guarding or rebound tenderness.  There is a fluid wave.  Extremities shows some slight edema in her legs.  Again, Maria Romero is still having a really tough time recovering from the chemotherapy.  Again I am dubious as to what him that were going to give her any further chemotherapy.  I would think that with frontline chemotherapy for ovarian cancer, the response rates should be about 80% or higher.  Again, physical therapy is critical for her.  Nutrition is very important for her.  It would really be nice to try to get her home sometime this week if she is able to.  I do appreciate the wonderful care that she is gotten from everybody up on [place], MD  Vonna Kotyk 1:9

## 2021-12-20 NOTE — Progress Notes (Signed)
Physical Therapy Treatment Patient Details Name: Maria Romero MRN: [id number] DOB: 10-24-1950 Today's Date: 12/20/2021   History of Present Illness Maria Romero is a 71 y.o. female admitted 9/6 who presents with increasing weakness and persistent diarrhea. could not tolerate thoracentesis Fri 9/8 due to hypotension.  PMH:  stage IIIC serous adenocarinoma of the right ovary on neoadjuvant chemotherapy requiring therapeutic paracentesis, HTN    PT Comments    Pt agreeable to attempt mobility however limited by symptomatic orthostatic hypotension with only sitting EOB.  RN notified and MD also aware.   12/20/21 0957  Vital Signs  BP Location Right Arm  BP Method Automatic  Patient Position (if appropriate) Orthostatic Vitals  Orthostatic Lying   BP- Lying (!) 85/66  Pulse- Lying 104  Orthostatic Sitting  BP- Sitting (!) 65/53  Pulse- Sitting 114      Recommendations for follow up therapy are one component of a multi-disciplinary discharge planning process, led by the attending physician.  Recommendations may be updated based on patient status, additional functional criteria and insurance authorization.  Follow Up Recommendations  Home health PT     Assistance Recommended at Discharge Frequent or constant Supervision/Assistance  Patient can return home with the following A little help with walking and/or transfers;A little help with bathing/dressing/bathroom;Assistance with cooking/housework;Direct supervision/assist for medications management;Direct supervision/assist for financial management;Assist for transportation;Help with stairs or ramp for entrance   Equipment Recommendations  Wheelchair (measurements PT);Wheelchair cushion (measurements PT)    Recommendations for Other Services       Precautions / Restrictions Precautions Precautions: Fall Precaution Comments: monitor BP Restrictions Weight Bearing Restrictions: No     Mobility  Bed Mobility Overal bed  mobility: Needs Assistance Bed Mobility: Supine to Sit, Sit to Supine     Supine to sit: Min guard Sit to supine: Min assist   General bed mobility comments: light assist for LEs returning to bed    Transfers                   General transfer comment: unable due to symptomatic orthostatic BP    Ambulation/Gait                   Stairs             Wheelchair Mobility    Modified Rankin (Stroke Patients Only)       Balance                                            Cognition Arousal/Alertness: Awake/alert Behavior During Therapy: WFL for tasks assessed/performed Overall Cognitive Status: Within Functional Limits for tasks assessed                                          Exercises      General Comments        Pertinent Vitals/Pain Pain Assessment Pain Assessment: No/denies pain    Home Living                          Prior Function            PT Goals (current goals can now be found in the care plan section) Progress towards PT goals: Not progressing  toward goals - comment (low BP)    Frequency    Min 3X/week      PT Plan Current plan remains appropriate    Co-evaluation              AM-PAC PT "6 Clicks" Mobility   Outcome Measure  Help needed turning from your back to your side while in a flat bed without using bedrails?: A Lot Help needed moving from lying on your back to sitting on the side of a flat bed without using bedrails?: A Lot Help needed moving to and from a bed to a chair (including a wheelchair)?: Total Help needed standing up from a chair using your arms (e.g., wheelchair or bedside chair)?: Total Help needed to walk in hospital room?: Total Help needed climbing 3-5 steps with a railing? : Total 6 Click Score: 8    End of Session   Activity Tolerance: Patient limited by fatigue;Treatment limited secondary to medical complications (Comment) (Low  BP) Patient left: in bed;with call bell/phone within reach   PT Visit Diagnosis: Muscle weakness (generalized) (M62.81);Difficulty in walking, not elsewhere classified (R26.2)     Time: 0950-1000 PT Time Calculation (min) (ACUTE ONLY): 10 min  Charges:  $Therapeutic Activity: 8-22 mins                    Jannette Spanner PT, DPT Physical Therapist Acute Rehabilitation Services Preferred contact method: Secure Chat Weekend Pager Only: [phone] Office: [place] 12/20/2021, 11:46 AM

## 2021-12-21 ENCOUNTER — Other Ambulatory Visit: Payer: Self-pay | Admitting: *Deleted

## 2021-12-21 ENCOUNTER — Encounter: Payer: Medicare Other | Admitting: Dietician

## 2021-12-21 ENCOUNTER — Inpatient Hospital Stay (HOSPITAL_COMMUNITY): Payer: Medicare Other

## 2021-12-21 DIAGNOSIS — D701 Agranulocytosis secondary to cancer chemotherapy: Secondary | ICD-10-CM | POA: Diagnosis not present

## 2021-12-21 DIAGNOSIS — R197 Diarrhea, unspecified: Secondary | ICD-10-CM | POA: Diagnosis not present

## 2021-12-21 DIAGNOSIS — J9601 Acute respiratory failure with hypoxia: Secondary | ICD-10-CM | POA: Diagnosis not present

## 2021-12-21 DIAGNOSIS — R188 Other ascites: Secondary | ICD-10-CM | POA: Diagnosis not present

## 2021-12-21 DIAGNOSIS — T451X5A Adverse effect of antineoplastic and immunosuppressive drugs, initial encounter: Secondary | ICD-10-CM | POA: Diagnosis not present

## 2021-12-21 LAB — CBC WITH DIFFERENTIAL/PLATELET
Abs Immature Granulocytes: 1.11 10*3/uL — ABNORMAL HIGH (ref 0.00–0.07)
Basophils Absolute: 0.1 10*3/uL (ref 0.0–0.1)
Basophils Relative: 1 %
Eosinophils Absolute: 0 10*3/uL (ref 0.0–0.5)
Eosinophils Relative: 0 %
HCT: 30.8 % — ABNORMAL LOW (ref 36.0–46.0)
Hemoglobin: 10.2 g/dL — ABNORMAL LOW (ref 12.0–15.0)
Immature Granulocytes: 7 %
Lymphocytes Relative: 8 %
Lymphs Abs: 1.3 10*3/uL (ref 0.7–4.0)
MCH: 29.5 pg (ref 26.0–34.0)
MCHC: 33.1 g/dL (ref 30.0–36.0)
MCV: 89 fL (ref 80.0–100.0)
Monocytes Absolute: 1.1 10*3/uL — ABNORMAL HIGH (ref 0.1–1.0)
Monocytes Relative: 7 %
Neutro Abs: 13.3 10*3/uL — ABNORMAL HIGH (ref 1.7–7.7)
Neutrophils Relative %: 77 %
Platelets: 126 10*3/uL — ABNORMAL LOW (ref 150–400)
RBC: 3.46 MIL/uL — ABNORMAL LOW (ref 3.87–5.11)
RDW: 14.4 % (ref 11.5–15.5)
WBC: 16.9 10*3/uL — ABNORMAL HIGH (ref 4.0–10.5)
nRBC: 0 % (ref 0.0–0.2)

## 2021-12-21 LAB — COMPREHENSIVE METABOLIC PANEL
ALT: 12 U/L (ref 0–44)
AST: 17 U/L (ref 15–41)
Albumin: 2.5 g/dL — ABNORMAL LOW (ref 3.5–5.0)
Alkaline Phosphatase: 144 U/L — ABNORMAL HIGH (ref 38–126)
Anion gap: 8 (ref 5–15)
BUN: 45 mg/dL — ABNORMAL HIGH (ref 8–23)
CO2: 16 mmol/L — ABNORMAL LOW (ref 22–32)
Calcium: 8.4 mg/dL — ABNORMAL LOW (ref 8.9–10.3)
Chloride: 106 mmol/L (ref 98–111)
Creatinine, Ser: 1.19 mg/dL — ABNORMAL HIGH (ref 0.44–1.00)
GFR, Estimated: 49 mL/min — ABNORMAL LOW (ref >60)
Glucose, Bld: 97 mg/dL (ref 70–99)
Potassium: 4.2 mmol/L (ref 3.5–5.1)
Sodium: 130 mmol/L — ABNORMAL LOW (ref 135–145)
Total Bilirubin: 0.8 mg/dL (ref 0.3–1.2)
Total Protein: 5.4 g/dL — ABNORMAL LOW (ref 6.5–8.1)

## 2021-12-21 LAB — CA 125: Cancer Antigen (CA) 125: 1858 U/mL — ABNORMAL HIGH (ref 0.0–38.1)

## 2021-12-21 LAB — CORTISOL: Cortisol, Plasma: 26.7 ug/dL

## 2021-12-21 MED ORDER — PANCRELIPASE (LIP-PROT-AMYL) 12000-38000 UNITS PO CPEP
36000.0000 [IU] | ORAL_CAPSULE | Freq: Three times a day (TID) | ORAL | Status: DC
  Administered 2021-12-21 – 2021-12-31 (×32): 36000 [IU] via ORAL
  Filled 2021-12-21 (×32): qty 3

## 2021-12-21 NOTE — Progress Notes (Addendum)
PROGRESS NOTE    Maria Romero  GLO:[id number] DOB: 1950/05/03 DOA: 12/15/2021 PCP: Hali Marry, MD    Brief Narrative:   Maria Romero is a 71 y.o. 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 Patient was sent from oncology office. She had her first cycle of neoadjuvant chemotherapy with carboplatinum/Taxol on 12/07/2021 and about 3 days later begin to have persistent diarrhea.Decrease intake. No nausea or vomiting. No fever or chills.  She was seen in oncology office 12/14/2021 and given IV fluids as well as Neulasta for white cell count of 800.  She returned to oncology today because she continued to be symptomatic and weak to the point where she fell. Abdominal ascites also seem to be worsening.  S/p paracentesis with 5L removed. Thoracentesis on hold until BP recovers Stay complicated by orthostatic hypotension and poor Po intake.    Assessment and Plan: * Neutropenia ([place]) Secondary to chemotherapy.  She is on neoadjuvant chemotherapy with carboplatinum/Taxol with first cycle on 12/07/2021. -Received Neulasta at oncology office 12/14/2021.   -No fever  Orthostatic Hypotension -s/p IV albumin -appears florinef was added on 9/9- will change to midodrine -gentle IVF for now as BP support but want to avoid volume overload -TED hose -recheck orthos s/p fluid/albumin-improved on 9/12-- no drop while going from laying to sitting-- was not able to stand  Acute urinary retention -foley placed -attempt removal when mobile  Diarrhea Likely due to intolerance to chemotherapy.  Although this is improving with last bowel movement yesterday. -c diff negative-- abx stopped -GI pathogen panel negative as well -imodium PRN  Acute respiratory failure with hypoxia (HCC) Requiring O2 supplement with  tachynpea due to left sided pleural effusion -thoracentesis when BP can tolerate  Pleural effusion on left -thoracentesis when BP tolerates  AKI (acute kidney injury) ([place]) Creatinine of 1.65 which has progressively been increasing since a month ago at 0.93. -improved with IVF  Hyperkalemia -resolved  Hyponatremia -stable  [name], right ([place]) Follows with oncology Dr. Marin Olp. -Had first session of neoadjuvant chemotherapy on 12/07/2021 and has been feeling unwell.   -appreciated Dr. Antonieta Pert note  Malignant ascites Last therapeutic paracentesis on 11/26/2021 with a 950 cc of peritoneal fluid removed -paracentesis 9/7 -5L removed -abdomen more tense, may need another thoracentesis  Essential hypertension Hold antihypertensive while volume depleted. -s/p albumin  GERD -add tums/protonix -no sign of thrush  DVT prophylaxis: Place TED hose Start: 12/21/21 0921 enoxaparin (LOVENOX) injection 40 mg Start: 12/18/21 1000 SCDs Start: 12/15/21 2000    Code Status: Full Code   Disposition Plan:  Level of care: Progressive Status is: Inpatient Remains inpatient appropriate because: poor Po intake    Consultants:  Oncology IR   Subjective: Not hungry  Objective: Vitals:   12/20/21 2021 12/21/21 0033 12/21/21 0404 12/21/21 0814  BP: 122/80 107/75 108/67 105/69  Pulse: (!) 103 (!) 103 (!) 106 (!) 106  Resp: 20 20 (!) 23 20  Temp: 98.2 F (36.8 C) 98.3 F (36.8 C) 98 F (36.7 C) 98 F (36.7 C)  TempSrc: Axillary Oral Oral   SpO2: 92% 97% 93% 96%  Weight:      Height:        Intake/Output Summary (Last 24 hours) at 12/21/2021 1034 Last data filed at 12/21/2021 0651 Gross per 24 hour  Intake 1227.96 ml  Output 275 ml  Net 952.96  ml   Filed Weights   12/15/21 2000  Weight: 69.5 kg    Examination:    General: Appearance:     Overweight frail appearing female      Lungs:     On [place], diminished breath sounds  Heart:    Tachycardic.   MS:   All  extremities are intact.   Neurologic:   Awake, alert, oriented x 3. No apparent focal neurological           defect.       Data Reviewed: I have personally reviewed following labs and imaging studies  CBC: Recent Labs  Lab 12/17/21 0355 12/18/21 0354 12/19/21 0319 12/20/21 0304 12/21/21 0336  WBC 3.6* 10.7* 15.8* 15.3* 16.9*  NEUTROABS 2.7 9.3* 12.7* 11.5* 13.3*  HGB 10.0* 10.4* 11.6* 11.5* 10.2*  HCT 29.8* 32.4* 35.9* 35.9* 30.8*  MCV 88.4 89.0 89.8 89.5 89.0  PLT 99* 92* 87* 97* 637*   Basic Metabolic Panel: Recent Labs  Lab 12/17/21 0355 12/18/21 0354 12/19/21 0319 12/20/21 0304 12/21/21 0336  NA 130* 134* 134* 130* 130*  K 4.2 3.9 3.7 4.3 4.2  CL 107 110 109 105 106  CO2 18* 18* 17* 17* 16*  GLUCOSE 99 136* 139* 102* 97  BUN 44* 40* 37* 43* 45*  CREATININE 0.96 0.99 0.93 1.08* 1.19*  CALCIUM 7.4* 7.8* 7.8* 7.9* 8.4*   GFR: Estimated Creatinine Clearance: 39.9 mL/min (A) (by C-G formula based on SCr of 1.19 mg/dL (H)). Liver Function Tests: Recent Labs  Lab 12/17/21 0355 12/18/21 0354 12/19/21 0319 12/20/21 0304 12/21/21 0336  AST '18 17 17 18 17  '$ ALT '15 12 13 14 12  '$ ALKPHOS 234* 164* 190* 178* 144*  BILITOT 0.7 0.7 0.6 0.6 0.8  PROT 4.3* 4.9* 5.0* 5.2* 5.4*  ALBUMIN 1.8* 2.3* 2.2* 2.0* 2.5*   No results for input(s): "LIPASE", "AMYLASE" in the last 168 hours. No results for input(s): "AMMONIA" in the last 168 hours. Coagulation Profile: No results for input(s): "INR", "PROTIME" in the last 168 hours. Cardiac Enzymes: No results for input(s): "CKTOTAL", "CKMB", "CKMBINDEX", "TROPONINI" in the last 168 hours. BNP (last 3 results) No results for input(s): "PROBNP" in the last 8760 hours. HbA1C: No results for input(s): "HGBA1C" in the last 72 hours. CBG: Recent Labs  Lab 12/17/21 2220  GLUCAP 126*   Lipid Profile: No results for input(s): "CHOL", "HDL", "LDLCALC", "TRIG", "CHOLHDL", "LDLDIRECT" in the last 72 hours. Thyroid Function Tests: No  results for input(s): "TSH", "T4TOTAL", "FREET4", "T3FREE", "THYROIDAB" in the last 72 hours. Anemia Panel: No results for input(s): "VITAMINB12", "FOLATE", "FERRITIN", "TIBC", "IRON", "RETICCTPCT" in the last 72 hours. Sepsis Labs: Recent Labs  Lab 12/15/21 1156 12/15/21 1520  LATICACIDVEN 1.2 0.9    Recent Results (from the past 240 hour(s))  Culture, blood (routine x 2)     Status: None   Collection Time: 12/15/21 12:25 PM   Specimen: Porta Cath; Blood  Result Value Ref Range Status   Specimen Description   Final    PORTA CATH RIGHT Performed at [facility], [place] [address] [place], [place] [address]    Special Requests   Final    BOTTLES DRAWN AEROBIC AND ANAEROBIC Blood Culture adequate volume Performed at [facility] [place] [place], [place] [address]    Culture   Final    NO GROWTH 5 DAYS Performed at [facility], [place] [address], [place], [place] [address]    Report  Status 12/20/2021 FINAL  Final  Culture, blood (routine x 2)     Status: None   Collection Time: 12/15/21  7:44 PM   Specimen: BLOOD  Result Value Ref Range Status   Specimen Description   Final    BLOOD BLOOD LEFT ARM Performed at [place] [address], [place], [place] [address]    Special Requests   Final    BOTTLES DRAWN AEROBIC ONLY Blood Culture results may not be optimal due to an inadequate volume of blood received in culture bottles Performed at [place] [address], [place], [place] [address]    Culture   Final    NO GROWTH 5 DAYS Performed at [facility], [place] [address] [place], [place] [address]    Report Status 12/20/2021 FINAL  Final  Gastrointestinal Panel by PCR , Stool     Status: None   Collection Time: 12/17/21  6:52 AM   Specimen: Stool  Result Value Ref Range Status   Campylobacter species NOT DETECTED NOT DETECTED Final   Plesimonas shigelloides NOT DETECTED NOT DETECTED Final    Salmonella species NOT DETECTED NOT DETECTED Final   Yersinia enterocolitica NOT DETECTED NOT DETECTED Final   Vibrio species NOT DETECTED NOT DETECTED Final   Vibrio cholerae NOT DETECTED NOT DETECTED Final   Enteroaggregative E coli (EAEC) NOT DETECTED NOT DETECTED Final   Enteropathogenic E coli (EPEC) NOT DETECTED NOT DETECTED Final   Enterotoxigenic E coli (ETEC) NOT DETECTED NOT DETECTED Final   Shiga like toxin producing E coli (STEC) NOT DETECTED NOT DETECTED Final   Shigella/Enteroinvasive E coli (EIEC) NOT DETECTED NOT DETECTED Final   Cryptosporidium NOT DETECTED NOT DETECTED Final   Cyclospora cayetanensis NOT DETECTED NOT DETECTED Final   Entamoeba histolytica NOT DETECTED NOT DETECTED Final   Giardia lamblia NOT DETECTED NOT DETECTED Final   Adenovirus F40/41 NOT DETECTED NOT DETECTED Final   Astrovirus NOT DETECTED NOT DETECTED Final   Norovirus GI/GII NOT DETECTED NOT DETECTED Final   Rotavirus A NOT DETECTED NOT DETECTED Final   Sapovirus (I, II, IV, and V) NOT DETECTED NOT DETECTED Final    Comment: Performed at [facility], [place] [place], [place] [address]  C Difficile Quick Screen w PCR reflex     Status: None   Collection Time: 12/17/21  6:52 AM   Specimen: Stool  Result Value Ref Range Status   C Diff antigen NEGATIVE NEGATIVE Final   C Diff toxin NEGATIVE NEGATIVE Final   C Diff interpretation No C. difficile detected.  Final    Comment: Performed at [facility], [place] [address] [place], [place] [address]         Radiology Studies: DG CHEST PORT 1 VIEW  Result Date: 12/21/2021 CLINICAL DATA:  Increasing shortness of breath, history of pleural effusion EXAM: PORTABLE CHEST 1 VIEW COMPARISON:  12/15/2021 FINDINGS: Slightly increased right pleural effusion, bilateral pleural effusions now moderate and similar in volume. No new airspace opacity. Right chest port catheter. Heart and mediastinum are normal. IMPRESSION:  Slightly increased right pleural effusion, bilateral pleural effusions now moderate and similar in volume. No new airspace opacity. Electronically Signed   By: Delanna Ahmadi M.D.   On: 12/21/2021 08:31        Scheduled Meds:  antiseptic oral rinse  15 mL Mouth Rinse Q4H   Chlorhexidine Gluconate Cloth  6 each Topical q morning   enoxaparin (LOVENOX) injection  40  mg Subcutaneous Q24H   feeding supplement  1 Container Oral TID BM   fiber supplement (BANATROL TF)  60 mL Oral BID   lipase/protease/amylase  36,000 Units Oral TID AC   megestrol  400 mg Oral BID   midodrine  5 mg Oral TID WC   mirtazapine  15 mg Oral QHS   multivitamin with minerals  1 tablet Oral Daily   pantoprazole  40 mg Oral BID   Continuous Infusions:     LOS: 5 days    Time spent: 45 minutes spent on chart review, discussion with nursing staff, consultants, updating family and interview/physical exam; more than 50% of that time was spent in counseling and/or coordination of care.    Geradine Girt, DO [facility] Available via Epic secure chat 7am-7pm After these hours, please refer to coverage provider listed on [email or web address] 12/21/2021, 10:34 AM

## 2021-12-21 NOTE — Progress Notes (Signed)
The proposed treatment discussed in conference is for discussion purpose only and is not a binding recommendation.  The patients have not been physically examined, or presented with their treatment options.  Therefore, final treatment plans cannot be decided.  

## 2021-12-21 NOTE — TOC Initial Note (Signed)
Transition of Care [facility]) - Initial/Assessment Note    Patient Details  Name: Maria Romero MRN: [id number] Date of Birth: 12/31/1950  [facility]) CM/SW Contact:    Roseanne Kaufman, RN Phone Number: 12/21/2021, 4:21 PM  Clinical Narrative:   PT recc HHPT, awaiting response from [facility] agencies for acceptance.  Pharmacy: [place]   Transportation at discharge   [facility] will continue to follow.  Expected Discharge Plan: [place] Barriers to Discharge: Continued Medical Work up   Patient Goals and CMS Choice Patient states their goals for this hospitalization and ongoing recovery are:: return [place] with home health services CMS Medicare.gov Compare Post Acute Care list provided to:: Patient Choice offered to / list presented to : Patient  Expected Discharge Plan and Services Expected Discharge Plan: [place] In-house Referral: NA Discharge Planning Services: CM Consult Post Acute Care Choice: [place] arrangements for the past 2 months: Single Family Home                 DME Arranged: N/A DME Agency: NA       HH Arranged: OT          Prior Living Arrangements/Services Living arrangements for the past 2 months: Single Family Home Lives with:: Adult Children Patient language and need for interpreter reviewed:: Yes Do you feel safe going back to the place where you live?: Yes      Need for Family Participation in Patient Care: Yes (Comment) Care giver support system in place?: Yes (comment) Current home services: Other (comment) (None) Criminal Activity/Legal Involvement Pertinent to Current Situation/Hospitalization: No - Comment as needed  Activities of Daily Living Home Assistive Devices/Equipment: None ADL Screening (condition at time of admission) Patient's cognitive ability adequate to safely complete daily activities?: Yes Is the patient deaf or have difficulty hearing?:  No Does the patient have difficulty seeing, even when wearing glasses/contacts?: No Does the patient have difficulty concentrating, remembering, or making decisions?: No Patient able to express need for assistance with ADLs?: Yes Does the patient have difficulty dressing or bathing?: No Independently performs ADLs?: Yes (appropriate for developmental age) Does the patient have difficulty walking or climbing stairs?: No Weakness of Legs: None Weakness of Arms/Hands: None  Permission Sought/Granted Permission sought to share information with : Case Manager Permission granted to share information with : Yes, Verbal Permission Granted  Share Information with NAME: Case Manager           Emotional Assessment Appearance:: Appears stated age Attitude/Demeanor/Rapport: Gracious Affect (typically observed): Accepting Orientation: : Oriented to Self, Oriented to Place, Oriented to  Time, Oriented to Situation Alcohol / Substance Use: Not Applicable Psych Involvement: No (comment)  Admission diagnosis:  Dehydration [E86.0] Pleural effusion [J90] Malignant ascites [R18.0] Malignant neoplasm of right ovary (HCC) [C56.1] Neutropenia (HCC) [D70.9] Neutropenia, unspecified type ([place]) [D70.9] Patient Active Problem List   Diagnosis Date Noted   Orthostatic hypotension 12/20/2021   Malnutrition of moderate degree 12/17/2021   Neutropenia ([place]) 12/15/2021   Hyponatremia 12/15/2021   Hyperkalemia 12/15/2021   AKI (acute kidney injury) ([place]) 12/15/2021   Pleural effusion on left 12/15/2021   Acute respiratory failure with hypoxia ([place]) 12/15/2021   Diarrhea 12/15/2021   Neutropenia, drug-induced ([place]) 12/14/2021   [name], right ([place]) 12/01/2021   Goals of care, counseling/discussion 12/01/2021   Malignant ascites 11/19/2021   Abdominal pain 11/08/2021   Osteoporosis 05/05/2021   Essential hypertension 09/16/2015  Raynaud disease 02/09/2015   Bradycardia 08/25/2009   PALPITATIONS  08/25/2009   DYSPNEA 08/25/2009   Abnormal levels of other serum enzymes 11/13/2007   Disorder of bone and cartilage 05/22/2007   HYPERCHOLESTEROLEMIA 05/21/2007   IBS 05/21/2007   LEG PAIN, LEFT 05/21/2007   PCP:  Hali Marry, MD Pharmacy:   [facility] [id number] - [place], [place] [address] Phone: [phone] Fax: [phone]     Social Determinants of Health (SDOH) Interventions    Readmission Risk Interventions     No data to display

## 2021-12-21 NOTE — Care Management Important Message (Signed)
Important Message  Patient Details IM Letter placed in Patients room. Name: Maria Romero MRN: [id number] Date of Birth: Jun 25, 1950   Medicare Important Message Given:  Yes     Kerin Salen 12/21/2021, 11:51 AM

## 2021-12-21 NOTE — Progress Notes (Signed)
We really not making a lot of progress.  She still is not eating much.  She feels somewhat bloated.  She says she is having little bit of diarrhea.  I will try her on some Creon to see if this may help with some of the digestive issues.  She I think trying to physical therapy yesterday.  Is hard to say how well she really did.  Her white cell count is 16.9.  Hemoglobin 10.2.  Platelet count 126,000.  The albumin is 2.5.  Her BUN is 45 creatinine 1.19.  Blood sugars 97.  It will be very interesting to see what her CA125 is.  She is not having any pain.  There is no obvious bleeding.  There is no shortness of breath.  She still has the Foley catheter in.  Again, I just doubt that she is ever going to take any systemic chemotherapy in the future.  She just is taking such a hit from this 1 cycle.  Even though it was dose reduced, she really has had a tough time.  It appears that the ascites might be trying to come back.  She may have a little bit more distention in the abdomen.  We may have to think about another paracentesis.  This is certainly an ominous factor as I would think that if chemotherapy was working that she would not have ascites right now.  I know she is trying hard.  She just is not eating much.  Her prealbumin of 6 is a clear indicator of this.  I just really hate that she has had a incredibly difficult time trying to recover from just 1 cycle of treatment.  We still trying to get her to surgery to try to get her disease debulked.  I am just not sure that she will ever make it to surgery unless her performance status really increases.  She is getting some albumin right now.  It will be interesting to see how her abdomen looks tomorrow.  Again, she may need to have another paracentesis.   Lattie Haw, MD  [place] 10:36

## 2021-12-22 ENCOUNTER — Inpatient Hospital Stay (HOSPITAL_COMMUNITY): Payer: Medicare Other

## 2021-12-22 DIAGNOSIS — E440 Moderate protein-calorie malnutrition: Secondary | ICD-10-CM

## 2021-12-22 DIAGNOSIS — I951 Orthostatic hypotension: Secondary | ICD-10-CM

## 2021-12-22 DIAGNOSIS — J9601 Acute respiratory failure with hypoxia: Secondary | ICD-10-CM | POA: Diagnosis not present

## 2021-12-22 DIAGNOSIS — D701 Agranulocytosis secondary to cancer chemotherapy: Secondary | ICD-10-CM | POA: Diagnosis not present

## 2021-12-22 DIAGNOSIS — R188 Other ascites: Secondary | ICD-10-CM | POA: Diagnosis not present

## 2021-12-22 DIAGNOSIS — N179 Acute kidney failure, unspecified: Secondary | ICD-10-CM | POA: Diagnosis not present

## 2021-12-22 DIAGNOSIS — R0602 Shortness of breath: Secondary | ICD-10-CM | POA: Diagnosis not present

## 2021-12-22 DIAGNOSIS — R197 Diarrhea, unspecified: Secondary | ICD-10-CM | POA: Diagnosis not present

## 2021-12-22 LAB — CBC WITH DIFFERENTIAL/PLATELET
Abs Immature Granulocytes: 1.78 10*3/uL — ABNORMAL HIGH (ref 0.00–0.07)
Basophils Absolute: 0.2 10*3/uL — ABNORMAL HIGH (ref 0.0–0.1)
Basophils Relative: 1 %
Eosinophils Absolute: 0 10*3/uL (ref 0.0–0.5)
Eosinophils Relative: 0 %
HCT: 31.4 % — ABNORMAL LOW (ref 36.0–46.0)
Hemoglobin: 10.4 g/dL — ABNORMAL LOW (ref 12.0–15.0)
Immature Granulocytes: 8 %
Lymphocytes Relative: 6 %
Lymphs Abs: 1.4 10*3/uL (ref 0.7–4.0)
MCH: 29.6 pg (ref 26.0–34.0)
MCHC: 33.1 g/dL (ref 30.0–36.0)
MCV: 89.5 fL (ref 80.0–100.0)
Monocytes Absolute: 1.4 10*3/uL — ABNORMAL HIGH (ref 0.1–1.0)
Monocytes Relative: 6 %
Neutro Abs: 18.5 10*3/uL — ABNORMAL HIGH (ref 1.7–7.7)
Neutrophils Relative %: 79 %
Platelets: 162 10*3/uL (ref 150–400)
RBC: 3.51 MIL/uL — ABNORMAL LOW (ref 3.87–5.11)
RDW: 14.7 % (ref 11.5–15.5)
WBC: 23.3 10*3/uL — ABNORMAL HIGH (ref 4.0–10.5)
nRBC: 0.1 % (ref 0.0–0.2)

## 2021-12-22 LAB — BODY FLUID CELL COUNT WITH DIFFERENTIAL
Lymphs, Fluid: 13 %
Monocyte-Macrophage-Serous Fluid: 59 % (ref 50–90)
Neutrophil Count, Fluid: 28 % — ABNORMAL HIGH (ref 0–25)
Total Nucleated Cell Count, Fluid: 444 cu mm (ref 0–1000)

## 2021-12-22 LAB — ALBUMIN, PLEURAL OR PERITONEAL FLUID: Albumin, Fluid: 1.7 g/dL

## 2021-12-22 LAB — COMPREHENSIVE METABOLIC PANEL
ALT: 13 U/L (ref 0–44)
AST: 21 U/L (ref 15–41)
Albumin: 2.3 g/dL — ABNORMAL LOW (ref 3.5–5.0)
Alkaline Phosphatase: 197 U/L — ABNORMAL HIGH (ref 38–126)
Anion gap: 8 (ref 5–15)
BUN: 45 mg/dL — ABNORMAL HIGH (ref 8–23)
CO2: 17 mmol/L — ABNORMAL LOW (ref 22–32)
Calcium: 8.7 mg/dL — ABNORMAL LOW (ref 8.9–10.3)
Chloride: 107 mmol/L (ref 98–111)
Creatinine, Ser: 1.2 mg/dL — ABNORMAL HIGH (ref 0.44–1.00)
GFR, Estimated: 48 mL/min — ABNORMAL LOW (ref >60)
Glucose, Bld: 117 mg/dL — ABNORMAL HIGH (ref 70–99)
Potassium: 4.5 mmol/L (ref 3.5–5.1)
Sodium: 132 mmol/L — ABNORMAL LOW (ref 135–145)
Total Bilirubin: 0.6 mg/dL (ref 0.3–1.2)
Total Protein: 5.6 g/dL — ABNORMAL LOW (ref 6.5–8.1)

## 2021-12-22 LAB — LACTATE DEHYDROGENASE, PLEURAL OR PERITONEAL FLUID: LD, Fluid: 358 U/L — ABNORMAL HIGH (ref 3–23)

## 2021-12-22 MED ORDER — MIDODRINE HCL 5 MG PO TABS
10.0000 mg | ORAL_TABLET | Freq: Three times a day (TID) | ORAL | Status: DC
  Administered 2021-12-22 – 2022-01-03 (×37): 10 mg via ORAL
  Filled 2021-12-22 (×37): qty 2

## 2021-12-22 MED ORDER — ALBUMIN HUMAN 25 % IV SOLN
25.0000 g | Freq: Once | INTRAVENOUS | Status: AC
  Administered 2021-12-22: 25 g via INTRAVENOUS
  Filled 2021-12-22: qty 100

## 2021-12-22 MED ORDER — LIDOCAINE HCL 1 % IJ SOLN
INTRAMUSCULAR | Status: AC
  Administered 2021-12-22: 15 mL
  Filled 2021-12-22: qty 20

## 2021-12-22 NOTE — Procedures (Signed)
PROCEDURE SUMMARY:  Successful US guided paracentesis from right lateral abdomen.  Yielded 4 liters of clear yellow fluid.  No immediate complications.  Patient tolerated well.  EBL = trace  Specimen was sent for labs.  Judie Grieve Florette Thai PA-C 12/22/2021 12:27 PM

## 2021-12-22 NOTE — Progress Notes (Signed)
PROGRESS NOTE    Maria Romero  IRJ:[id number] DOB: [birth year] DOA: 12/15/2021 PCP: Hali Marry, MD    Brief Narrative:   Maria Romero is a 71 y.o. 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  Patient recently started on chemotherapy with carboplatinum/Taxol on 8/29 and subsequent 3 days later she began to have persistent diarrhea with decreased oral intake.  Patient denied any nausea/vomiting and no fever/chills.  She was seen in the oncology office on 9/5 and given IV fluids as well as Neulasta for white cell count of 800.  She returned to the oncology office due to continued symptoms and generalized weakness.  She was then advised to present to the ED for further evaluation.  In the ED, patient was afebrile, tachycardic to 101 bpm, tachypneic.  WBC of 700.  Hemoglobin 11.3, sodium 126, potassium 5.6, creatinine 1.65.  Albumin 1.9.  Chest x-ray showing increasing left pleural effusion with vascular congestion.  Patient was given IV albumin, Lokelma and 1 L normal saline fluid and started on continuous fluid.  Patient was transferred to [facility] under the hospitalist service.  Assessment & Plan:   Acute respiratory failure with hypoxia Pleural effusion Chest x-ray with moderate bilateral pleural effusions.  Continues require 3 L nasal cannula. --Continue supplemental oxygen, maintain SPO2 greater than 92% --If BP stable tomorrow, anticipate attempt at thoracentesis  Acute renal failure Creatinine 1.65 on admission, which has progressively been increasing since a month ago where creatinine was 0.93. --Cr 1.65>>1.20  Hyperkalemia: Resolved Treated with Lokelma  on admission, now resolved.  Potassium 4.5 today. --BMP daily  Ovarian cancer, right stage IIIc Follows with medical oncology outpatient, Dr. Marin Olp.  Completed first session of neoadjuvant chemotherapy on 12/07/2021.  Malignant ascites Underwent paracentesis on 9/7 with 5 L removed.  Abdomen continues to be more tense, underwent repeat paracentesis on 9/13 with 4 L removed. --Albumin 25 g IV x1 today --Strict I's and O's  Orthostatic hypotension --Orthostatic vital signs daily --Midodrine '10mg'$  p.o. 3 times daily  Acute urinary retention --Foley catheter placed --Plan voiding trial once more mobile  Neutropenia: Resolved WBC count 0.5 on admission with ANC 0.0.  Received Neulasta in oncology clinic. --WBC 0.5>>16.98>23.3 --CBC daily  Diarrhea: Likely secondary to chemotherapy.  Slowly improving.  C. difficile and GI PCR panel negative. --Lomotil 4 times daily as needed --Supportive care  Hyperlipidemia: Lipitor 40 mg p.o. daily  GERD --Protonix 40 mg p.o. twice daily   DVT prophylaxis: Place TED hose Start: 12/21/21 0921 enoxaparin (LOVENOX) injection 40 mg Start: 12/18/21 1000 SCDs Start: 12/15/21 2000    Code Status: Full Code Family Communication: No family present at bedside this morning  Disposition Plan:  Level of care: Progressive Status is: Inpatient Remains inpatient appropriate because: Needs further improvement of diarrhea, therapy before stable for discharge home, anticipate likely need of repeat thoracentesis and needs titration off of supplemental oxygen    Consultants:  Medical oncology, Dr. Marin Olp Interventional radiology  Procedures:  Paracentesis 9/7, 5 L removed Paracentesis 9/13, 4 L removed  Antimicrobials:  Ceftriaxone 9/6 - 9/7 Metronidazole 9/6 - 9/8   Subjective: Patient seen examined bedside, resting comfortably.  Sitting in bedside chair.  Continues with diarrhea, shortness of breath.  Planned paracentesis today.  Seen  by  medical oncology this morning.  Continues also with generalized weakness/fatigue.  Attempting to eat some of her breakfast this morning.  No other complaints or concerns at this time.  Denies headache, no dizziness, no chest pain, no palpitations, no fever/chills/night sweats, no nausea/vomiting/diarrhea, no focal weakness, no cough/congestion, no paresthesias.  No acute events overnight per nursing staff.  Objective: Vitals:   12/22/21 1107 12/22/21 1114 12/22/21 1122 12/22/21 1148  BP: 96/62 (!) 92/53 (!) 85/62 98/67  Pulse:    (!) 110  Resp:    18  Temp:      TempSrc:      SpO2:    97%  Weight:      Height:        Intake/Output Summary (Last 24 hours) at 12/22/2021 1455 Last data filed at 12/22/2021 1100 Gross per 24 hour  Intake 245 ml  Output 200 ml  Net 45 ml   Filed Weights   12/15/21 2000  Weight: 69.5 kg    Examination:  Physical Exam: GEN: NAD, alert and oriented x 3, chronically ill in appearance HEENT: NCAT, PERRL, EOMI, sclera clear, MMM PULM: Breath sounds diminished bilateral bases with mild crackles, normal respiratory effort without accessory muscle use, no wheezing, on 3 L nasal cannula with SPO2 96% at rest CV: RRR w/o M/G/R, Port-A-Cath noted right chest GI: abd soft, NTND, NABS, no R/G/M GU: Foley catheter noted draining clear yellow urine in collection bag MSK: no peripheral edema, muscle strength globally intact 5/5 bilateral upper/lower extremities NEURO: CN II-XII intact, no focal deficits, moves all extremities independently Integumentary: dry/intact, no rashes or wounds    Data Reviewed: I have personally reviewed following labs and imaging studies  CBC: Recent Labs  Lab 12/18/21 0354 12/19/21 0319 12/20/21 0304 12/21/21 0336 12/22/21 0340  WBC 10.7* 15.8* 15.3* 16.9* 23.3*  NEUTROABS 9.3* 12.7* 11.5* 13.3* 18.5*  HGB 10.4* 11.6* 11.5* 10.2* 10.4*  HCT 32.4* 35.9* 35.9* 30.8* 31.4*  MCV 89.0 89.8 89.5 89.0 89.5  PLT 92* 87* 97* 126*  892   Basic Metabolic Panel: Recent Labs  Lab 12/18/21 0354 12/19/21 0319 12/20/21 0304 12/21/21 0336 12/22/21 0340  NA 134* 134* 130* 130* 132*  K 3.9 3.7 4.3 4.2 4.5  CL 110 109 105 106 107  CO2 18* 17* 17* 16* 17*  GLUCOSE 136* 139* 102* 97 117*  BUN 40* 37* 43* 45* 45*  CREATININE 0.99 0.93 1.08* 1.19* 1.20*  CALCIUM 7.8* 7.8* 7.9* 8.4* 8.7*   GFR: Estimated Creatinine Clearance: 39.6 mL/min (A) (by C-G formula based on SCr of 1.2 mg/dL (H)). Liver Function Tests: Recent Labs  Lab 12/18/21 0354 12/19/21 0319 12/20/21 0304 12/21/21 0336 12/22/21 0340  AST '17 17 18 17 21  '$ ALT '12 13 14 12 13  '$ ALKPHOS 164* 190* 178* 144* 197*  BILITOT 0.7 0.6 0.6 0.8 0.6  PROT 4.9* 5.0* 5.2* 5.4* 5.6*  ALBUMIN 2.3* 2.2* 2.0* 2.5* 2.3*   No results for input(s): "LIPASE", "AMYLASE" in the last 168 hours. No results for input(s): "AMMONIA" in the last 168 hours. Coagulation Profile: No results for input(s): "INR", "PROTIME" in the last 168 hours. Cardiac Enzymes: No results for input(s): "CKTOTAL", "CKMB", "CKMBINDEX", "TROPONINI" in the last 168 hours. BNP (last 3 results) No results for input(s): "PROBNP" in the last 8760 hours. HbA1C: No results for input(s): "HGBA1C" in the last 72 hours. CBG: Recent Labs  Lab 12/17/21 2220  GLUCAP 126*   Lipid Profile: No results for input(s): "CHOL", "HDL", "  [place]", "TRIG", "CHOLHDL", "LDLDIRECT" in the last 72 hours. Thyroid Function Tests: No results for input(s): "TSH", "T4TOTAL", "FREET4", "T3FREE", "THYROIDAB" in the last 72 hours. Anemia Panel: No results for input(s): "VITAMINB12", "FOLATE", "FERRITIN", "TIBC", "IRON", "RETICCTPCT" in the last 72 hours. Sepsis Labs: Recent Labs  Lab 12/15/21 1520  LATICACIDVEN 0.9    Recent Results (from the past 240 hour(s))  Culture, blood (routine x 2)     Status: None   Collection Time: 12/15/21 12:25 PM   Specimen: Porta Cath; Blood  Result Value Ref Range Status   Specimen  Description   Final    PORTA CATH RIGHT Performed at [facility] [address], [place], [place] [address]    Special Requests   Final    BOTTLES DRAWN AEROBIC AND ANAEROBIC Blood Culture adequate volume Performed at [facility], [address] [place], [place] [address]    Culture   Final    NO GROWTH 5 DAYS Performed at [facility], [place] [address] [place], [place] [address]    Report Status 12/20/2021 FINAL  Final  Culture, blood (routine x 2)     Status: None   Collection Time: 12/15/21  7:44 PM   Specimen: BLOOD  Result Value Ref Range Status   Specimen Description   Final    BLOOD BLOOD LEFT ARM Performed at [facility] [address], [place], [place] [address]    Special Requests   Final    BOTTLES DRAWN AEROBIC ONLY Blood Culture results may not be optimal due to an inadequate volume of blood received in culture bottles Performed at [facility] [address] [place], [place] [address]    Culture   Final    NO GROWTH 5 DAYS Performed at [facility], [place] [address], [place], [place] [address]    Report Status 12/20/2021 FINAL  Final  Gastrointestinal Panel by PCR , Stool     Status: None   Collection Time: 12/17/21  6:52 AM   Specimen: Stool  Result Value Ref Range Status   Campylobacter species NOT DETECTED NOT DETECTED Final   Plesimonas shigelloides NOT DETECTED NOT DETECTED Final   Salmonella species NOT DETECTED NOT DETECTED Final   Yersinia enterocolitica NOT DETECTED NOT DETECTED Final   Vibrio species NOT DETECTED NOT DETECTED Final   Vibrio cholerae NOT DETECTED NOT DETECTED Final   Enteroaggregative E coli (EAEC) NOT DETECTED NOT DETECTED Final   Enteropathogenic E coli (EPEC) NOT DETECTED NOT DETECTED Final   Enterotoxigenic E coli (ETEC) NOT DETECTED NOT DETECTED Final   Shiga like toxin producing E coli (STEC) NOT DETECTED NOT DETECTED Final   Shigella/Enteroinvasive E coli  (EIEC) NOT DETECTED NOT DETECTED Final   Cryptosporidium NOT DETECTED NOT DETECTED Final   Cyclospora cayetanensis NOT DETECTED NOT DETECTED Final   Entamoeba histolytica NOT DETECTED NOT DETECTED Final   Giardia lamblia NOT DETECTED NOT DETECTED Final   Adenovirus F40/41 NOT DETECTED NOT DETECTED Final   Astrovirus NOT DETECTED NOT DETECTED Final   Norovirus GI/GII NOT DETECTED NOT DETECTED Final   Rotavirus A NOT DETECTED NOT DETECTED Final   Sapovirus (I, II, IV, and V) NOT DETECTED NOT DETECTED Final    Comment: Performed at [facility], [address], [place],  [address]  C Difficile Quick Screen w PCR reflex     Status: None   Collection Time: 12/17/21  6:52 AM   Specimen: Stool  Result Value Ref Range Status   C Diff antigen NEGATIVE NEGATIVE Final   C Diff toxin NEGATIVE NEGATIVE Final   C Diff interpretation No C. difficile detected.  Final    Comment: Performed at [facility], [address] [place], [place] [address]         Radiology Studies: US Paracentesis  Result Date: 12/22/2021 INDICATION: Ovarian cancer with recurrent ascites. Request for diagnostic and therapeutic paracentesis. EXAM: ULTRASOUND GUIDED PARACENTESIS MEDICATIONS: 1% lidocaine 10 mL COMPLICATIONS: None immediate. PROCEDURE: Informed written consent was obtained from the patient after a discussion of the risks, benefits and alternatives to treatment. A timeout was performed prior to the initiation of the procedure. Initial ultrasound scanning demonstrates a large amount of ascites within the right lateral abdomen. The right lateral abdomen was prepped and draped in the usual sterile fashion. 1% lidocaine was used for local anesthesia. Following this, a 19 gauge, 7-cm, Yueh catheter was introduced. An ultrasound image was saved for documentation purposes. The paracentesis was performed. The catheter was removed and a dressing was applied. The patient tolerated the  procedure well without immediate post procedural complication. FINDINGS: A total of approximately 4 L of clear yellow fluid was removed. Samples were sent to the laboratory as requested by the clinical team. IMPRESSION: Successful ultrasound-guided paracentesis yielding 4 liters of peritoneal fluid. Electronically Signed   By: Aletta Edouard M.D.   On: 12/22/2021 13:06   DG CHEST PORT 1 VIEW  Result Date: 12/21/2021 CLINICAL DATA:  Increasing shortness of breath, history of pleural effusion EXAM: PORTABLE CHEST 1 VIEW COMPARISON:  12/15/2021 FINDINGS: Slightly increased right pleural effusion, bilateral pleural effusions now moderate and similar in volume. No new airspace opacity. Right chest port catheter. Heart and mediastinum are normal. IMPRESSION: Slightly increased right pleural effusion, bilateral pleural effusions now moderate and similar in volume. No new airspace opacity. Electronically Signed   By: Delanna Ahmadi M.D.   On: 12/21/2021 08:31        Scheduled Meds:  antiseptic oral rinse  15 mL Mouth Rinse Q4H   Chlorhexidine Gluconate Cloth  6 each Topical q morning   enoxaparin (LOVENOX) injection  40 mg Subcutaneous Q24H   feeding supplement  1 Container Oral TID BM   fiber supplement (BANATROL TF)  60 mL Oral BID   lipase/protease/amylase  36,000 Units Oral TID AC   megestrol  400 mg Oral BID   midodrine  5 mg Oral TID WC   mirtazapine  15 mg Oral QHS   multivitamin with minerals  1 tablet Oral Daily   pantoprazole  40 mg Oral BID   Continuous Infusions:   LOS: 6 days    Time spent: 51 minutes spent on chart review, discussion with nursing staff, consultants, updating family and interview/physical exam; more than 50% of that time was spent in counseling and/or coordination of care.    Maria Romero J [place] DO [facility] Available via Epic secure chat 7am-7pm After these hours, please refer to coverage provider listed on [email or web address] 12/22/2021, 2:55 PM

## 2021-12-22 NOTE — TOC Progression Note (Signed)
Transition of Care [facility]) - Progression Note    Patient Details  Name: Maria Romero MRN: [id number] Date of Birth: 04/15/1950  Transition of Care [facility]) CM/SW [place], RN Phone Number: 12/22/2021, 4:17 PM  Clinical Narrative:   Jeralene Huff out to multiple [place] agencies, awaiting HH response.   TOC will continue to follow    Expected Discharge Plan: [place] Barriers to Discharge: Continued Medical Work up  Expected Discharge Plan and Services Expected Discharge Plan: [place] In-house Referral: NA Discharge Planning Services: CM Consult Post Acute Care Choice: [place] arrangements for the past 2 months: Single Family Home                 DME Arranged: N/A DME Agency: NA       HH Arranged: PT           Social Determinants of Health (SDOH) Interventions    Readmission Risk Interventions     No data to display

## 2021-12-22 NOTE — Progress Notes (Signed)
Maria Romero might be feeling a little bit better.  Unfortunately, she still is having some ascites recurrence.  She findings have another paracentesis today.  The chest x-ray that we did yesterday showed that she had the bilateral pleural effusions.  I suspect this is probably more so from her ascites, not through the diaphragmatic holes.  Her CA125 has come down.  As such, I have to believe that the chemotherapy is worked.  She really needs to get out of bed into a chair.  Maybe 1 hour of shift in a chair would help her out.  She says she has little bit more of an appetite.  She is not having any diarrhea.  Her labs show a BUN of 45 creatinine 1.2.  Calcium 8.7 with an albumin of 2.3.  Her white cell count is 23.3.  Hemoglobin 10.4.  Platelet count 162,000.  I suspect the elevated white cell count is from the Neulasta that she got in the office.  She has had no fever.  She has had no bleeding.  She does feel some shortness of breath.  She may need to have a thoracentesis.  Her vital signs are temperature 97.7.  Pulse 110.  Blood pressure 107/78.  Oxygen saturation on 2 L is 96%.  Her lungs sound pretty clear bilaterally.  She has good air movement bilaterally.  Cardiac exam tachycardic but regular.  She has no murmurs.  Abdomen is slightly distended.  She does have a fluid wave.  She has no tenderness to palpation.  Extremity shows no clubbing, cyanosis or edema.  We still have a ways to go with Maria Romero.  Again, she is incredibly deconditioned.  She needs a lot of physical therapy.  If we can just get her into a chair for 1 hour shift, this might help her out.  She has had another paracentesis.  I just hate the fact that this fluid keeps coming back.  So far, it has been negative for cancer.  I think that if we are going to treat her again, we will have to be with more targeted therapy.  I also would probably use Avastin.  I did speak with her daughter yesterday.  Unfortunately, there is  no way that she is going be able to go home after discharge.  She will have to go to either rehab or skilled nursing until she can get strong enough to be independent.  I do appreciate everybody's help with her.  Lattie Haw, MD  Romans 12:12

## 2021-12-22 NOTE — Progress Notes (Signed)
PT Cancellation Note  Patient Details Name: Maria Romero MRN: [id number] DOB: December 01, 1950   Cancelled Treatment:     US guided paracentesis from right lateral abdomen will attempt to see another day as pt plans to D/C back home with family support.   Rica Koyanagi  PTA Acute  Rehabilitation Services Office M-F          [phone] Weekend pager [phone]

## 2021-12-22 NOTE — Progress Notes (Signed)
Patient out of bed this morning with breakfast. Patient continues to have diarrhea. RN administer PRN lomotil.

## 2021-12-22 NOTE — Progress Notes (Signed)
IVT came by to reaccess port.  Pt has albumin running.  IVT advised pt will come back to access port later today.

## 2021-12-23 ENCOUNTER — Inpatient Hospital Stay: Payer: Medicare Other | Admitting: Dietician

## 2021-12-23 ENCOUNTER — Inpatient Hospital Stay (HOSPITAL_COMMUNITY): Payer: Medicare Other

## 2021-12-23 DIAGNOSIS — J9601 Acute respiratory failure with hypoxia: Secondary | ICD-10-CM | POA: Diagnosis not present

## 2021-12-23 DIAGNOSIS — R0602 Shortness of breath: Secondary | ICD-10-CM | POA: Diagnosis not present

## 2021-12-23 DIAGNOSIS — R197 Diarrhea, unspecified: Secondary | ICD-10-CM | POA: Diagnosis not present

## 2021-12-23 DIAGNOSIS — R188 Other ascites: Secondary | ICD-10-CM | POA: Diagnosis not present

## 2021-12-23 DIAGNOSIS — D701 Agranulocytosis secondary to cancer chemotherapy: Secondary | ICD-10-CM | POA: Diagnosis not present

## 2021-12-23 DIAGNOSIS — N179 Acute kidney failure, unspecified: Secondary | ICD-10-CM | POA: Diagnosis not present

## 2021-12-23 DIAGNOSIS — C569 Malignant neoplasm of unspecified ovary: Secondary | ICD-10-CM | POA: Diagnosis not present

## 2021-12-23 LAB — BASIC METABOLIC PANEL
Anion gap: 6 (ref 5–15)
BUN: 36 mg/dL — ABNORMAL HIGH (ref 8–23)
CO2: 17 mmol/L — ABNORMAL LOW (ref 22–32)
Calcium: 8.4 mg/dL — ABNORMAL LOW (ref 8.9–10.3)
Chloride: 109 mmol/L (ref 98–111)
Creatinine, Ser: 0.99 mg/dL (ref 0.44–1.00)
GFR, Estimated: >60 mL/min (ref >60)
Glucose, Bld: 90 mg/dL (ref 70–99)
Potassium: 3.7 mmol/L (ref 3.5–5.1)
Sodium: 132 mmol/L — ABNORMAL LOW (ref 135–145)

## 2021-12-23 LAB — CBC
HCT: 29.3 % — ABNORMAL LOW (ref 36.0–46.0)
Hemoglobin: 9.5 g/dL — ABNORMAL LOW (ref 12.0–15.0)
MCH: 29.1 pg (ref 26.0–34.0)
MCHC: 32.4 g/dL (ref 30.0–36.0)
MCV: 89.6 fL (ref 80.0–100.0)
Platelets: 189 10*3/uL (ref 150–400)
RBC: 3.27 MIL/uL — ABNORMAL LOW (ref 3.87–5.11)
RDW: 14.9 % (ref 11.5–15.5)
WBC: 21.8 10*3/uL — ABNORMAL HIGH (ref 4.0–10.5)
nRBC: 0.1 % (ref 0.0–0.2)

## 2021-12-23 LAB — ABO/RH: ABO/RH(D): A NEG

## 2021-12-23 LAB — CYTOLOGY - NON PAP

## 2021-12-23 LAB — PREPARE RBC (CROSSMATCH)

## 2021-12-23 LAB — LACTATE DEHYDROGENASE: LDH: 175 U/L (ref 98–192)

## 2021-12-23 MED ORDER — LIDOCAINE HCL 1 % IJ SOLN
INTRAMUSCULAR | Status: AC
  Filled 2021-12-23: qty 20

## 2021-12-23 MED ORDER — FUROSEMIDE 10 MG/ML IJ SOLN
20.0000 mg | Freq: Once | INTRAMUSCULAR | Status: AC
  Administered 2021-12-23: 20 mg via INTRAVENOUS
  Filled 2021-12-23: qty 2

## 2021-12-23 MED ORDER — SODIUM CHLORIDE 0.9% IV SOLUTION: Freq: Once | INTRAVENOUS | Status: AC

## 2021-12-23 NOTE — Progress Notes (Signed)
PT Cancellation Note  Patient Details Name: Maria Romero MRN: [id number] DOB: 02/05/1951   Cancelled Treatment:     US guided diagnostic and therapeutic left thoracentesis today.  Will attempt to see another day as schedule permits.    Rica Koyanagi  PTA Acute  Rehabilitation Services Office M-F          [phone] Weekend pager [phone]

## 2021-12-23 NOTE — Progress Notes (Signed)
PROGRESS NOTE    Maria Romero  IWO:[id number] DOB: 07/16/1950 DOA: 12/15/2021 PCP: Hali Marry, MD    Brief Narrative:   Maria Romero is a 71 y.o. female with past medical history significant for serous adenocarcinoma right ovary stage IIIc on neoadjuvant chemotherapy requiring recurrent paracentesis, essential hypertension who presented to [facility] ED on 9/6 by discretion of her oncologist for progressive shortness of breath, abdominal distention, diarrhea and generalized weakness.  Patient recently started on chemotherapy with carboplatinum/Taxol on 8/29 and subsequent 3 days later she began to have persistent diarrhea with decreased oral intake.  Patient denied any nausea/vomiting and no fever/chills.  She was seen in the oncology office on 9/5 and given IV fluids as well as Neulasta for white cell count of 800.  She returned to the oncology office due to continued symptoms and generalized weakness.  She was then advised to present to the ED for further evaluation.  In the ED, patient was afebrile, tachycardic to 101 bpm, tachypneic.  WBC of 700.  Hemoglobin 11.3, sodium 126, potassium 5.6, creatinine 1.65.  Albumin 1.9.  Chest x-ray showing increasing left pleural effusion with vascular congestion.  Patient was given IV albumin, Lokelma and 1 L normal saline fluid and started on continuous fluid.  Patient was transferred to [facility] under the hospitalist service.  Assessment & Plan:   Acute respiratory failure with hypoxia Pleural effusion Chest x-ray with moderate bilateral pleural effusions.  Continues require 3 L nasal cannula. --Continue supplemental oxygen, maintain SPO2 greater than 92% --IR consulted for thoracentesis  Acute renal failure Creatinine 1.65 on admission, which has progressively been increasing since a month ago where creatinine was 0.93. --Cr 1.65>>1.20>0.99  Hyperkalemia: Resolved Treated with Lokelma on admission, now  resolved.  Potassium 3.7 today. --BMP daily  Ovarian cancer, right stage IIIc Follows with medical oncology outpatient, Dr. Marin Olp.  Completed first session of neoadjuvant chemotherapy on 12/07/2021.  Overall appears very deconditioned and unclear how well she will tolerate continued chemotherapy given her current functional status.  Overall very poor prognosis  Malignant ascites Underwent paracentesis on 9/7 with 5 L removed.  Abdomen continues to be more tense, underwent repeat paracentesis on 9/13 with 4 L removed. --Strict I's and O's  Orthostatic hypotension --Orthostatic vital signs daily --Midodrine '10mg'$  p.o. 3 times daily  Acute urinary retention --Foley catheter placed --Plan voiding trial once more mobile  Neutropenia: Resolved WBC count 0.5 on admission with ANC 0.0.  Received Neulasta in oncology clinic. --WBC 0.5>>16.98>23.3>21.8 --CBC daily  Diarrhea: Likely secondary to chemotherapy.  Slowly improving.  C. difficile and GI PCR panel negative. --Lomotil 4 times daily as needed --Supportive care  Hyperlipidemia: Lipitor 40 mg p.o. daily  GERD --Protonix 40 mg p.o. twice daily   DVT prophylaxis: Place TED hose Start: 12/21/21 0921 enoxaparin (LOVENOX) injection 40 mg Start: 12/18/21 1000 SCDs Start: 12/15/21 2000    Code Status: Full Code Family Communication: No family present at bedside this morning  Disposition Plan:  Level of care: Progressive Status is: Inpatient Remains inpatient appropriate because: Receiving transfusion PRBCs today, pending thoracentesis tomorrow    Consultants:  Medical oncology, Dr. Marin Olp Interventional radiology  Procedures:  Paracentesis 9/7, 5 L removed Paracentesis 9/13, 4 L removed Thoracentesis 9/15: Pending  Antimicrobials:  Ceftriaxone 9/6 - 9/7 Metronidazole 9/6 - 9/8   Subjective: Patient seen examined bedside, resting comfortably.  Lying in bed.  No significant change from yesterday.  Underwent  paracentesis yesterday with 4 L  removed.  Continues with generalized weakness/fatigue.  States "did not do well sitting in the chair yesterday.  Seen by medical oncology, Dr. Marin Olp this a.m.; ordered 1 unit PRBC and thoracentesis for tomorrow.  No other complaints or concerns at this time.  Denies headache, no dizziness, no chest pain, no palpitations, no fever/chills/night sweats, no nausea/vomiting/diarrhea, no focal weakness, no cough/congestion, no paresthesias.  No acute events overnight per nursing staff.  Objective: Vitals:   12/23/21 0509 12/23/21 1121 12/23/21 1122 12/23/21 1137  BP: 98/64 101/68 101/68 98/70  Pulse:  (!) 107 (!) 107 (!) 104  Resp:  '18 18 20  '$ Temp: 97.7 F (36.5 C) 97.7 F (36.5 C) 97.7 F (36.5 C) (!) 97.4 F (36.3 C)  TempSrc: Oral Oral Oral Oral  SpO2: 97% 97% 97% 96%  Weight:      Height:        Intake/Output Summary (Last 24 hours) at 12/23/2021 1217 Last data filed at 12/22/2021 1820 Gross per 24 hour  Intake 0 ml  Output --  Net 0 ml   Filed Weights   12/15/21 2000  Weight: 69.5 kg    Examination:  Physical Exam: GEN: NAD, alert and oriented x 3, chronically ill/fatigued in appearance HEENT: NCAT, PERRL, EOMI, sclera clear, MMM PULM: Breath sounds diminished bilateral bases with mild crackles, normal respiratory effort without accessory muscle use, no wheezing, on 3 L nasal cannula with SPO2 97% at rest CV: RRR w/o M/G/R, Port-A-Cath noted right chest GI: abd soft, NTND, NABS, no R/G/M GU: Foley catheter noted draining clear yellow urine in collection bag MSK: no peripheral edema, muscle strength globally intact 5/5 bilateral upper/lower extremities NEURO: CN II-XII intact, no focal deficits, moves all extremities independently Integumentary: dry/intact, no rashes or wounds    Data Reviewed: I have personally reviewed following labs and imaging studies  CBC: Recent Labs  Lab 12/18/21 0354 12/19/21 0319 12/20/21 0304 12/21/21 0336  12/22/21 0340 12/23/21 0500  WBC 10.7* 15.8* 15.3* 16.9* 23.3* 21.8*  NEUTROABS 9.3* 12.7* 11.5* 13.3* 18.5*  --   HGB 10.4* 11.6* 11.5* 10.2* 10.4* 9.5*  HCT 32.4* 35.9* 35.9* 30.8* 31.4* 29.3*  MCV 89.0 89.8 89.5 89.0 89.5 89.6  PLT 92* 87* 97* 126* 162 878   Basic Metabolic Panel: Recent Labs  Lab 12/19/21 0319 12/20/21 0304 12/21/21 0336 12/22/21 0340 12/23/21 0500  NA 134* 130* 130* 132* 132*  K 3.7 4.3 4.2 4.5 3.7  CL 109 105 106 107 109  CO2 17* 17* 16* 17* 17*  GLUCOSE 139* 102* 97 117* 90  BUN 37* 43* 45* 45* 36*  CREATININE 0.93 1.08* 1.19* 1.20* 0.99  CALCIUM 7.8* 7.9* 8.4* 8.7* 8.4*   GFR: Estimated Creatinine Clearance: 48 mL/min (by C-G formula based on SCr of 0.99 mg/dL). Liver Function Tests: Recent Labs  Lab 12/18/21 0354 12/19/21 0319 12/20/21 0304 12/21/21 0336 12/22/21 0340  AST '17 17 18 17 21  '$ ALT '12 13 14 12 13  '$ ALKPHOS 164* 190* 178* 144* 197*  BILITOT 0.7 0.6 0.6 0.8 0.6  PROT 4.9* 5.0* 5.2* 5.4* 5.6*  ALBUMIN 2.3* 2.2* 2.0* 2.5* 2.3*   No results for input(s): "LIPASE", "AMYLASE" in the last 168 hours. No results for input(s): "AMMONIA" in the last 168 hours. Coagulation Profile: No results for input(s): "INR", "PROTIME" in the last 168 hours. Cardiac Enzymes: No results for input(s): "CKTOTAL", "CKMB", "CKMBINDEX", "TROPONINI" in the last 168 hours. BNP (last 3 results) No results for input(s): "PROBNP" in the last 8760  hours. HbA1C: No results for input(s): "HGBA1C" in the last 72 hours. CBG: Recent Labs  Lab 12/17/21 2220  GLUCAP 126*   Lipid Profile: No results for input(s): "CHOL", "HDL", "LDLCALC", "TRIG", "CHOLHDL", "LDLDIRECT" in the last 72 hours. Thyroid Function Tests: No results for input(s): "TSH", "T4TOTAL", "FREET4", "T3FREE", "THYROIDAB" in the last 72 hours. Anemia Panel: No results for input(s): "VITAMINB12", "FOLATE", "FERRITIN", "TIBC", "IRON", "RETICCTPCT" in the last 72 hours. Sepsis Labs: No results for  input(s): "PROCALCITON", "LATICACIDVEN" in the last 168 hours.   Recent Results (from the past 240 hour(s))  Culture, blood (routine x 2)     Status: None   Collection Time: 12/15/21 12:25 PM   Specimen: Porta Cath; Blood  Result Value Ref Range Status   Specimen Description   Final    PORTA CATH RIGHT Performed at [facility], [address], [place], [place] [address]    Special Requests   Final    BOTTLES DRAWN AEROBIC AND ANAEROBIC Blood Culture adequate volume Performed at [facility], [address] [place], [place] [address]    Culture   Final    NO GROWTH 5 DAYS Performed at [facility], [place] [address] [place], [place] [address]    Report Status 12/20/2021 FINAL  Final  Culture, blood (routine x 2)     Status: None   Collection Time: 12/15/21  7:44 PM   Specimen: BLOOD  Result Value Ref Range Status   Specimen Description   Final    BLOOD BLOOD LEFT ARM Performed at [facility] [address] [place], [place] [address]    Special Requests   Final    BOTTLES DRAWN AEROBIC ONLY Blood Culture results may not be optimal due to an inadequate volume of blood received in culture bottles Performed at [place] [address], [place], [place] [address]    Culture   Final    NO GROWTH 5 DAYS Performed at [facility], [place] [address], [place],  [address]    Report Status 12/20/2021 FINAL  Final  Gastrointestinal Panel by PCR , Stool     Status: None   Collection Time: 12/17/21  6:52 AM   Specimen: Stool  Result Value Ref Range Status   Campylobacter species NOT DETECTED NOT DETECTED Final   Plesimonas shigelloides NOT DETECTED NOT DETECTED Final   Salmonella species NOT DETECTED NOT DETECTED Final   Yersinia enterocolitica NOT DETECTED NOT DETECTED Final   Vibrio species NOT DETECTED NOT DETECTED Final   Vibrio cholerae NOT DETECTED NOT DETECTED Final   Enteroaggregative E coli (EAEC)  NOT DETECTED NOT DETECTED Final   Enteropathogenic E coli (EPEC) NOT DETECTED NOT DETECTED Final   Enterotoxigenic E coli (ETEC) NOT DETECTED NOT DETECTED Final   Shiga like toxin producing E coli (STEC) NOT DETECTED NOT DETECTED Final   Shigella/Enteroinvasive E coli (EIEC) NOT DETECTED NOT DETECTED Final   Cryptosporidium NOT DETECTED NOT DETECTED Final   Cyclospora cayetanensis NOT DETECTED NOT DETECTED Final   Entamoeba histolytica NOT DETECTED NOT DETECTED Final   Giardia lamblia NOT DETECTED NOT DETECTED Final   Adenovirus F40/41 NOT DETECTED NOT DETECTED Final   Astrovirus NOT DETECTED NOT DETECTED Final   Norovirus GI/GII NOT DETECTED NOT DETECTED Final   Rotavirus A NOT DETECTED NOT DETECTED Final   Sapovirus (I, II, IV, and V) NOT DETECTED NOT DETECTED Final    Comment: Performed at [facility],  [place], [place] [address]  C Difficile Quick Screen w PCR reflex     Status: None   Collection Time: 12/17/21  6:52 AM   Specimen: Stool  Result Value Ref Range Status   C Diff antigen NEGATIVE NEGATIVE Final   C Diff toxin NEGATIVE NEGATIVE Final   C Diff interpretation No C. difficile detected.  Final    Comment: Performed at [facility], [place] [address] [place], [place] [address]         Radiology Studies: US Paracentesis  Result Date: 12/22/2021 INDICATION: Ovarian cancer with recurrent ascites. Request for diagnostic and therapeutic paracentesis. EXAM: ULTRASOUND GUIDED PARACENTESIS MEDICATIONS: 1% lidocaine 10 mL COMPLICATIONS: None immediate. PROCEDURE: Informed written consent was obtained from the patient after a discussion of the risks, benefits and alternatives to treatment. A timeout was performed prior to the initiation of the procedure. Initial ultrasound scanning demonstrates a large amount of ascites within the right lateral abdomen. The right lateral abdomen was prepped and draped in the usual sterile fashion. 1%  lidocaine was used for local anesthesia. Following this, a 19 gauge, 7-cm, Yueh catheter was introduced. An ultrasound image was saved for documentation purposes. The paracentesis was performed. The catheter was removed and a dressing was applied. The patient tolerated the procedure well without immediate post procedural complication. FINDINGS: A total of approximately 4 L of clear yellow fluid was removed. Samples were sent to the laboratory as requested by the clinical team. IMPRESSION: Successful ultrasound-guided paracentesis yielding 4 liters of peritoneal fluid. Electronically Signed   By: Aletta Edouard M.D.   On: 12/22/2021 13:06        Scheduled Meds:  antiseptic oral rinse  15 mL Mouth Rinse Q4H   Chlorhexidine Gluconate Cloth  6 each Topical q morning   enoxaparin (LOVENOX) injection  40 mg Subcutaneous Q24H   feeding supplement  1 Container Oral TID BM   fiber supplement (BANATROL TF)  60 mL Oral BID   furosemide  20 mg Intravenous Once   lipase/protease/amylase  36,000 Units Oral TID AC   megestrol  400 mg Oral BID   midodrine  10 mg Oral TID WC   mirtazapine  15 mg Oral QHS   multivitamin with minerals  1 tablet Oral Daily   pantoprazole  40 mg Oral BID   Continuous Infusions:   LOS: 7 days    Time spent: 51 minutes spent on chart review, discussion with nursing staff, consultants, updating family and interview/physical exam; more than 50% of that time was spent in counseling and/or coordination of care.    Maria Romero J [place] DO [facility] Available via Epic secure chat 7am-7pm After these hours, please refer to coverage provider listed on [email or web address] 12/23/2021, 12:17 PM

## 2021-12-23 NOTE — Plan of Care (Signed)

## 2021-12-23 NOTE — Procedures (Signed)
PROCEDURE SUMMARY:  Successful US guided diagnostic and therapeutic left thoracentesis. Yielded 800 cc of clear, amber fluid. Pt tolerated procedure well. No immediate complications.  Specimen was sent for labs. CXR ordered.  EBL < 1 mL  Tyson Alias, AGNP 12/23/2021 3:31 PM

## 2021-12-23 NOTE — TOC Progression Note (Signed)
Transition of Care [facility]) - Progression Note    Patient Details  Name: Maria Romero MRN: [id number] Date of Birth: 12-08-1950  Transition of Care [facility]) CM/SW [place], RN Phone Number: 12/23/2021, 2:40 PM  Clinical Narrative:   PT recc HHPT, choice offered patient chose [facility] agency that accepts her insurance. HHPT arranged with Anderson Malta with [facility]. HHPT information on AVS for discharge.   TOC will continue to follow.     Expected Discharge Plan: [place] Barriers to Discharge: Continued Medical Work up  Expected Discharge Plan and Services Expected Discharge Plan: [place] In-house Referral: NA Discharge Planning Services: CM Consult Post Acute Care Choice: [place] arrangements for the past 2 months: Single Family Home                 DME Arranged: N/A DME Agency: NA       HH Arranged: PT HH Agency: [place] Date [place]: 12/22/21 Time [place]: 1884 Representative spoke with at [place]: [place] Determinants of Health ([place]) Interventions    Readmission Risk Interventions     No data to display

## 2021-12-23 NOTE — Progress Notes (Signed)
She had 4 more liters of fluid taken off the abdomen.  This is still quite disappointing to me.  She now is having some diarrhea.  I am unsure why she is having the diarrhea.  She says she is eating a little bit better.  There is no nausea or vomiting.  I really do think that she would benefit from 1 unit of blood.  I realize her blood is not all that low.  However, I think that getting 1 unit of blood into her might be able to help with her feeling a little bit better.  It may help with some of the diarrhea.  She does feel little short of breath.  I am sure that she still has a fluid in her lungs.  We will have to see about getting a thoracentesis on her.  Is hard to say what kind of progress were making.  She still incredibly deconditioned.  I am unsure if she was able to do much in the way of physical therapy yesterday.  Her labs today show a sodium 132.  Potassium 3.7.  BUN 36 creatinine 0.99.  Calcium 8.4.  Her white cell count is 21.8.  Hemoglobin 9.5.  Platelet count 189,000.  I do not think that she has had any fever.  She has had any problems with pain.  Her mouth is still dry.  Her vital signs show temperature of 97.7.  Pulse 93.  Blood pressure 98/64.  Her lungs are relatively clear bilaterally.  Cardiac exam regular rate and rhythm.  Abdomen is soft.  There may be a little bit of a fluid wave.  There is no guarding or rebound tenderness.  Bowel sounds are present.  Extremity shows no clubbing, cyanosis or edema.  Neurological exam is nonfocal.  I have just wish that Maria Romero will improve.  I am just not seeing much improvement right now.  She still has of ascites.  Surprising, the situs is always been benign for malignancy.  I cannot imagine why she would have ascites outside of the fact that she has the ovarian cancer.  I do not think that she is ever going to get chemotherapy in the future unless she makes some dramatic improvement.  I am thinking about targeted therapy which  certainly might not be as effective.  Again, unit of blood might help her.  I talked her about this today.  I explained why I thought that a transfusion would not be a bad idea.  She does understand this.  She is willing to have a transfusion to try to get her to feel better.  I will have to check the stool make sure there is no infection there.  I know this is incredibly complicated.  I do appreciate everybody's help on 4 W.  Lattie Haw, MD  Romans 12:12

## 2021-12-24 ENCOUNTER — Inpatient Hospital Stay (HOSPITAL_COMMUNITY): Payer: Medicare Other

## 2021-12-24 ENCOUNTER — Encounter: Payer: Self-pay | Admitting: *Deleted

## 2021-12-24 DIAGNOSIS — N179 Acute kidney failure, unspecified: Secondary | ICD-10-CM | POA: Diagnosis not present

## 2021-12-24 DIAGNOSIS — C569 Malignant neoplasm of unspecified ovary: Secondary | ICD-10-CM | POA: Diagnosis not present

## 2021-12-24 DIAGNOSIS — R197 Diarrhea, unspecified: Secondary | ICD-10-CM | POA: Diagnosis not present

## 2021-12-24 DIAGNOSIS — R0602 Shortness of breath: Secondary | ICD-10-CM | POA: Diagnosis not present

## 2021-12-24 DIAGNOSIS — J9601 Acute respiratory failure with hypoxia: Secondary | ICD-10-CM | POA: Diagnosis not present

## 2021-12-24 DIAGNOSIS — R188 Other ascites: Secondary | ICD-10-CM | POA: Diagnosis not present

## 2021-12-24 DIAGNOSIS — D701 Agranulocytosis secondary to cancer chemotherapy: Secondary | ICD-10-CM | POA: Diagnosis not present

## 2021-12-24 LAB — TYPE AND SCREEN
ABO/RH(D): A NEG
Antibody Screen: NEGATIVE
Unit division: 0

## 2021-12-24 LAB — CBC WITH DIFFERENTIAL/PLATELET
Abs Immature Granulocytes: 2.21 10*3/uL — ABNORMAL HIGH (ref 0.00–0.07)
Basophils Absolute: 0.1 10*3/uL (ref 0.0–0.1)
Basophils Relative: 0 %
Eosinophils Absolute: 0 10*3/uL (ref 0.0–0.5)
Eosinophils Relative: 0 %
HCT: 37 % (ref 36.0–46.0)
Hemoglobin: 12.2 g/dL (ref 12.0–15.0)
Immature Granulocytes: 8 %
Lymphocytes Relative: 8 %
Lymphs Abs: 2.1 10*3/uL (ref 0.7–4.0)
MCH: 28.7 pg (ref 26.0–34.0)
MCHC: 33 g/dL (ref 30.0–36.0)
MCV: 87.1 fL (ref 80.0–100.0)
Monocytes Absolute: 1.2 10*3/uL — ABNORMAL HIGH (ref 0.1–1.0)
Monocytes Relative: 5 %
Neutro Abs: 20.6 10*3/uL — ABNORMAL HIGH (ref 1.7–7.7)
Neutrophils Relative %: 79 %
Platelets: 223 10*3/uL (ref 150–400)
RBC: 4.25 MIL/uL (ref 3.87–5.11)
RDW: 15.3 % (ref 11.5–15.5)
WBC: 26.2 10*3/uL — ABNORMAL HIGH (ref 4.0–10.5)
nRBC: 0.1 % (ref 0.0–0.2)

## 2021-12-24 LAB — COMPREHENSIVE METABOLIC PANEL
ALT: 13 U/L (ref 0–44)
AST: 21 U/L (ref 15–41)
Albumin: 1.9 g/dL — ABNORMAL LOW (ref 3.5–5.0)
Alkaline Phosphatase: 222 U/L — ABNORMAL HIGH (ref 38–126)
Anion gap: 6 (ref 5–15)
BUN: 33 mg/dL — ABNORMAL HIGH (ref 8–23)
CO2: 19 mmol/L — ABNORMAL LOW (ref 22–32)
Calcium: 8.5 mg/dL — ABNORMAL LOW (ref 8.9–10.3)
Chloride: 108 mmol/L (ref 98–111)
Creatinine, Ser: 0.94 mg/dL (ref 0.44–1.00)
GFR, Estimated: >60 mL/min (ref >60)
Glucose, Bld: 98 mg/dL (ref 70–99)
Potassium: 3.8 mmol/L (ref 3.5–5.1)
Sodium: 133 mmol/L — ABNORMAL LOW (ref 135–145)
Total Bilirubin: 0.6 mg/dL (ref 0.3–1.2)
Total Protein: 4.8 g/dL — ABNORMAL LOW (ref 6.5–8.1)

## 2021-12-24 LAB — BPAM RBC
Blood Product Expiration Date: 202310112359
ISSUE DATE / TIME: 202309141113
Unit Type and Rh: 600

## 2021-12-24 MED ORDER — VENLAFAXINE HCL 75 MG PO TABS
37.5000 mg | ORAL_TABLET | Freq: Two times a day (BID) | ORAL | Status: DC
  Administered 2021-12-24 – 2022-01-03 (×22): 37.5 mg via ORAL
  Filled 2021-12-24 (×22): qty 1

## 2021-12-24 MED ORDER — LIDOCAINE HCL 1 % IJ SOLN
INTRAMUSCULAR | Status: AC
  Administered 2021-12-24: 15 mL
  Filled 2021-12-24: qty 20

## 2021-12-24 MED ORDER — ACYCLOVIR 5 % EX OINT
TOPICAL_OINTMENT | Freq: Every day | CUTANEOUS | Status: DC
  Administered 2021-12-24 – 2021-12-26 (×5): 1 via TOPICAL
  Filled 2021-12-24: qty 15

## 2021-12-24 NOTE — Progress Notes (Signed)
Nutrition Follow-up  DOCUMENTATION CODES:   Non-severe (moderate) malnutrition in context of chronic illness  INTERVENTION:   -48 hour Calorie Count per MD -results available 9/18  -Boost Breeze po TID, each supplement provides 250 kcal and 9 grams of protein   -Multivitamin with minerals daily  -Banatrol fiber supplement BID, each provides 45 kcals, 2g protein and 5g fiber  -Needs updated weight for admission  NUTRITION DIAGNOSIS:   Moderate Malnutrition related to chronic illness, cancer and cancer related treatments as evidenced by mild fat depletion, mild muscle depletion.  GOAL:   Patient will meet greater than or equal to 90% of their needs  MONITOR:   PO intake, Supplement acceptance, Labs, Weight trends, I & O's  REASON FOR ASSESSMENT:   Consult Calorie Count  ASSESSMENT:   71 y.o. 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  9/13: s/p paracentesis, yield: 4L 9/14: s/p paracentesis, yield: 800 ml  Patient accepting Boost Breeze somewhat and only taking sips of fiber supplement. PO intakes documented as 85-100% of meals on 9/13 and no documentation since.  MD has ordered a 48 hour Calorie Count to assess intakes.  RD placed envelope on door and will have results following the weekend. Last BM 9/13.  Admission weight: 153 lbs No other weights this admission.  Medications: Creon, Banatrol, Megace, Remeron, Multivitamin with minerals daily  Labs reviewed: Low Na   Diet Order:   Diet Order             Diet regular Room service appropriate? Yes; Fluid consistency: Thin  Diet effective now                   EDUCATION NEEDS:   No education needs have been identified at this time  Skin:  Skin Assessment: Reviewed RN Assessment  Last BM:  9/13 -type 7  Height:    Ht Readings from Last 1 Encounters:  12/15/21 '5\' 3"'$  (1.6 m)    Weight:   Wt Readings from Last 1 Encounters:  12/15/21 69.5 kg    BMI:  Body mass index is 27.14 kg/m.  Estimated Nutritional Needs:   Kcal:  1850-2050  Protein:  95-105g  Fluid:  2L/day  Clayton Bibles, MS, RD, LDN Inpatient Clinical Dietitian Contact information available via Amion

## 2021-12-24 NOTE — Progress Notes (Signed)
It is really hard to say how much progress were making.  She is still incredibly deconditioned.  She did have a unit of blood yesterday.  This will help with her hemoglobin now up to 12.2.  She had a thoracentesis.  This was left side.  200 cc of fluid was removed.  I probably would try to get fluid out of the right lung.  Her albumin is only 1.9.  This is quite worrisome to me.  She says that she is eating.  I just wonder how much she really is eating.  You may need to get a calorie count going on her to see on what she is taking in.  She is really not doing much in the way of physical therapy.  She really needs at least try to sit in a chair.  She says that there was 1 episode of diarrhea yesterday.  She has stool cultures sent off.  None are back yet.  Her BUN is 33 creatinine 0.94.  Her calcium was 8.5 with an albumin of 1.9.  Again, she just is incredibly deconditioned.  I am just not sure how we are going to be able to get her back to where she is more independent.  I really hate that she had taken such a hit from 1 treatment.  I realize that the treatment worked by the fact that the CA 125 is come down.  However, I just do not see that we are going to be able to give her any other treatment.  She clearly is not going to be a candidate for any surgery.  She is not complain of any pain.  She is on Megace.  She is on Remeron.  Both of these are supposed to help with her appetite.  I wonder if we should try her on some type of antidepressant to see if this may help boost her up a little bit.  This is really a very tough problem.  I want to try to give her as much of a chance is possible to try to get better.  She now has cold sores on her upper lip.  I think this is another indication as to how deconditioned she is.  I will give her some Zovirax ointment to put on.  I do not see anything that is intraoral with respect to lesions.  Again if he does try at least get a sitting up in a chair  that may help a little bit.  We will see about another thoracentesis on the right side.  I do not feel much in the way of fluid coming back in the abdomen.  However, this is always a concern.  I will recheck a prealbumin on Monday.  I think this will be quite informative.  I do appreciate the wonderful care that she is getting from everybody on 48 W.    Lattie Haw, MD  Exodus 14:14

## 2021-12-24 NOTE — Progress Notes (Signed)
PROGRESS NOTE    LAURYN LIZARDI  NWG:[id number] DOB: November 21, 1950 DOA: 12/15/2021 PCP: Hali Marry, MD    Brief Narrative:   Maria Romero is a 71 y.o. 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  Patient recently started on chemotherapy with carboplatinum/Taxol on 8/29 and subsequent 3 days later she began to have persistent diarrhea with decreased oral intake.  Patient denied any nausea/vomiting and no fever/chills.  She was seen in the oncology office on 9/5 and given IV fluids as well as Neulasta for white cell count of 800.  She returned to the oncology office due to continued symptoms and generalized weakness.  She was then advised to present to the ED for further evaluation.  In the ED, patient was afebrile, tachycardic to 101 bpm, tachypneic.  WBC of 700.  Hemoglobin 11.3, sodium 126, potassium 5.6, creatinine 1.65.  Albumin 1.9.  Chest x-ray showing increasing left pleural effusion with vascular congestion.  Patient was given IV albumin, Lokelma and 1 L normal saline fluid and started on continuous fluid.  Patient was transferred to [facility] under the hospitalist service.  Assessment & Plan:   Acute respiratory failure with hypoxia Pleural effusion Chest x-ray with moderate bilateral pleural effusions.  Continues require 3 L nasal cannula.  Underwent IR ultrasound-guided left thoracentesis on 9/14 with 800 mL fluid removed. --Continue supplemental oxygen, maintain SPO2 greater than 92% --IR consulted for right-sided thoracentesis today  Acute renal failure: Resolved Creatinine 1.65 on admission, which has progressively been increasing since a month ago where  creatinine was 0.93. --Cr 1.65>>1.20>0.99>0.94  Hyperkalemia: Resolved Treated with Lokelma on admission, now resolved.  Potassium 3.8 today. --BMP daily  Ovarian cancer, right stage IIIc Follows with medical oncology outpatient, Dr. Marin Olp.  Completed first session of neoadjuvant chemotherapy on 12/07/2021.  Overall appears very deconditioned and unclear how well she will tolerate continued chemotherapy given her current functional status.  Overall very poor prognosis  Malignant ascites Underwent paracentesis on 9/7 with 5 L removed.  Abdomen continues to be more tense, underwent repeat paracentesis on 9/13 with 4 L removed. --Strict I's and O's  Orthostatic hypotension --Orthostatic vital signs daily --Midodrine '10mg'$  p.o. 3 times daily  Acute urinary retention --Foley catheter placed --Plan voiding trial once more mobile  Neutropenia: Resolved WBC count 0.5 on admission with ANC 0.0.  Received Neulasta in oncology clinic. --WBC 0.5>>16.98>23.3>21.8>26.2 --CBC daily  Diarrhea: Likely secondary to chemotherapy.  Slowly improving.  C. difficile and GI PCR panel negative. --Lomotil 4 times daily as needed --Medical oncology repeating C. difficile/GI PCR panel --Supportive care  Anemia of chronic medical disease Transfuse 1 unit PRBC on 9/14.  Hemoglobin up to 12.2.  Hyperlipidemia: Lipitor 40 mg p.o. daily  GERD --Protonix 40 mg p.o. twice daily  Depression: --Started on venlafaxine 37.5 mg p.o. twice daily  Weakness/deconditioning/debility: --PT now recommending SNF placement --TOC for evaluation  Moderate protein calorie malnutrition Body mass index is 27.14 kg/m. Nutrition Status: Nutrition Problem: Moderate Malnutrition Etiology: chronic illness, cancer and cancer related treatments Signs/Symptoms: mild fat depletion, mild muscle depletion Interventions: Boost Breeze, MVI -- Dietitian following, encourage increased oral intake, supplementation, currently  undergoing calorie count     DVT prophylaxis: Place TED hose Start: 12/21/21 0921 enoxaparin (LOVENOX) injection 40  mg Start: 12/18/21 1000 SCDs Start: 12/15/21 2000    Code Status: Full Code Family Communication: No family present at bedside this morning  Disposition Plan:  Level of care: Progressive Status is: Inpatient Remains inpatient appropriate because: Pending repeat thoracentesis on right today, PT now recommends SNF placement, TOC for evaluation    Consultants:  Medical oncology, Dr. Marin Olp Interventional radiology  Procedures:  Paracentesis 9/7, 5 L removed Paracentesis 9/13, 4 L removed Left Thoracentesis 9/14: 819m removed Right thoracentesis 9/15: Pending  Antimicrobials:  Ceftriaxone 9/6 - 9/7 Metronidazole 9/6 - 9/8   Subjective: Patient seen examined bedside, resting comfortably.  Lying in bed.  Reports shortness of breath slightly improved following thoracentesis yesterday with removal of 800 mL from the left side, otherwise no significant change from yesterday.  Transfused 1 unit PRBC yesterday.  Seen by medical oncology, Dr. EMarin Olpthis morning and ordered a repeat thoracentesis now for the right side as well as venlafaxine and IM B12 injection.  Overall patient remains severely deconditioned and now PT recommending SNF placement. No other complaints or concerns at this time.  Denies headache, no dizziness, no chest pain, no palpitations, no fever/chills/night sweats, no nausea/vomiting/diarrhea, no focal weakness, no cough/congestion, no paresthesias.  No acute events overnight per nursing staff.  Objective: Vitals:   12/23/21 1525 12/23/21 2000 12/23/21 2039 12/24/21 0452  BP: 102/76  100/68 109/78  Pulse:   100 (!) 107  Resp:  (!) '22 16 20  '$ Temp:   98.3 F (36.8 C) 98 F (36.7 C)  TempSrc:   Oral Oral  SpO2:   95% 95%  Weight:      Height:        Intake/Output Summary (Last 24 hours) at 12/24/2021 1208 Last data filed at 12/24/2021  1000 Gross per 24 hour  Intake 790 ml  Output 100 ml  Net 690 ml   Filed Weights   12/15/21 2000  Weight: 69.5 kg    Examination:  Physical Exam: GEN: NAD, alert and oriented x 3, chronically ill/fatigued in appearance HEENT: NCAT, PERRL, EOMI, sclera clear, MMM PULM: Breath sounds diminished bilateral bases with mild crackles, normal respiratory effort without accessory muscle use, no wheezing, on 3 L nasal cannula with SPO2 97% at rest CV: RRR w/o M/G/R, Port-A-Cath noted right chest GI: abd soft, NTND, NABS, no R/G/M GU: Foley catheter noted draining clear yellow urine in collection bag MSK: no peripheral edema, muscle strength globally intact 5/5 bilateral upper/lower extremities NEURO: CN II-XII intact, no focal deficits, moves all extremities independently Integumentary: dry/intact, no rashes or wounds    Data Reviewed: I have personally reviewed following labs and imaging studies  CBC: Recent Labs  Lab 12/19/21 0319 12/20/21 0304 12/21/21 0336 12/22/21 0340 12/23/21 0500 12/24/21 0334  WBC 15.8* 15.3* 16.9* 23.3* 21.8* 26.2*  NEUTROABS 12.7* 11.5* 13.3* 18.5*  --  20.6*  HGB 11.6* 11.5* 10.2* 10.4* 9.5* 12.2  HCT 35.9* 35.9* 30.8* 31.4* 29.3* 37.0  MCV 89.8 89.5 89.0 89.5 89.6 87.1  PLT 87* 97* 126* 162 189 2259  Basic Metabolic Panel: Recent Labs  Lab 12/20/21 0304 12/21/21 0336 12/22/21 0340 12/23/21 0500 12/24/21 0334  NA 130* 130* 132* 132* 133*  K 4.3 4.2 4.5 3.7 3.8  CL 105 106 107 109 108  CO2 17* 16* 17* 17* 19*  GLUCOSE 102* 97 117* 90 98  BUN 43* 45* 45* 36* 33*  CREATININE 1.08* 1.19* 1.20* 0.99 0.94  CALCIUM 7.9* 8.4* 8.7* 8.4* 8.5*  GFR: Estimated Creatinine Clearance: 50.6 mL/min (by C-G formula based on SCr of 0.94 mg/dL). Liver Function Tests: Recent Labs  Lab 12/19/21 0319 12/20/21 0304 12/21/21 0336 12/22/21 0340 12/24/21 0334  AST '17 18 17 21 21  '$ ALT '13 14 12 13 13  '$ ALKPHOS 190* 178* 144* 197* 222*  BILITOT 0.6 0.6  0.8 0.6 0.6  PROT 5.0* 5.2* 5.4* 5.6* 4.8*  ALBUMIN 2.2* 2.0* 2.5* 2.3* 1.9*   No results for input(s): "LIPASE", "AMYLASE" in the last 168 hours. No results for input(s): "AMMONIA" in the last 168 hours. Coagulation Profile: No results for input(s): "INR", "PROTIME" in the last 168 hours. Cardiac Enzymes: No results for input(s): "CKTOTAL", "CKMB", "CKMBINDEX", "TROPONINI" in the last 168 hours. BNP (last 3 results) No results for input(s): "PROBNP" in the last 8760 hours. HbA1C: No results for input(s): "HGBA1C" in the last 72 hours. CBG: Recent Labs  Lab 12/17/21 2220  GLUCAP 126*   Lipid Profile: No results for input(s): "CHOL", "HDL", "LDLCALC", "TRIG", "CHOLHDL", "LDLDIRECT" in the last 72 hours. Thyroid Function Tests: No results for input(s): "TSH", "T4TOTAL", "FREET4", "T3FREE", "THYROIDAB" in the last 72 hours. Anemia Panel: No results for input(s): "VITAMINB12", "FOLATE", "FERRITIN", "TIBC", "IRON", "RETICCTPCT" in the last 72 hours. Sepsis Labs: No results for input(s): "PROCALCITON", "LATICACIDVEN" in the last 168 hours.   Recent Results (from the past 240 hour(s))  Culture, blood (routine x 2)     Status: None   Collection Time: 12/15/21 12:25 PM   Specimen: Porta Cath; Blood  Result Value Ref Range Status   Specimen Description   Final    PORTA CATH RIGHT Performed at [facility], [address] [place], [place] [address]    Special Requests   Final    BOTTLES DRAWN AEROBIC AND ANAEROBIC Blood Culture adequate volume Performed at [facility], [address] [place], [place] [address]    Culture   Final    NO GROWTH 5 DAYS Performed at [facility], [place] [address], [place], [place] [address]    Report Status 12/20/2021 FINAL  Final  Culture, blood (routine x 2)     Status: None   Collection Time: 12/15/21  7:44 PM   Specimen: BLOOD  Result Value Ref Range Status   Specimen Description   Final    BLOOD BLOOD LEFT  ARM Performed at [facility] [address], [place], [place] [address]    Special Requests   Final    BOTTLES DRAWN AEROBIC ONLY Blood Culture results may not be optimal due to an inadequate volume of blood received in culture bottles Performed at [facility] [address] [place], [place] [address]    Culture   Final    NO GROWTH 5 DAYS Performed at [facility], [place] [address] [place], [place] [address]    Report Status 12/20/2021 FINAL  Final  Gastrointestinal Panel by PCR , Stool     Status: None   Collection Time: 12/17/21  6:52 AM   Specimen: Stool  Result Value Ref Range Status   Campylobacter species NOT DETECTED NOT DETECTED Final   Plesimonas shigelloides NOT DETECTED NOT DETECTED Final   Salmonella species NOT DETECTED NOT DETECTED Final   Yersinia enterocolitica NOT DETECTED NOT DETECTED Final   Vibrio species NOT DETECTED NOT DETECTED Final   Vibrio cholerae NOT DETECTED NOT DETECTED Final   Enteroaggregative E coli (EAEC) NOT DETECTED NOT DETECTED Final  Enteropathogenic E coli (EPEC) NOT DETECTED NOT DETECTED Final   Enterotoxigenic E coli (ETEC) NOT DETECTED NOT DETECTED Final   Shiga like toxin producing E coli (STEC) NOT DETECTED NOT DETECTED Final   Shigella/Enteroinvasive E coli (EIEC) NOT DETECTED NOT DETECTED Final   Cryptosporidium NOT DETECTED NOT DETECTED Final   Cyclospora cayetanensis NOT DETECTED NOT DETECTED Final   Entamoeba histolytica NOT DETECTED NOT DETECTED Final   Giardia lamblia NOT DETECTED NOT DETECTED Final   Adenovirus F40/41 NOT DETECTED NOT DETECTED Final   Astrovirus NOT DETECTED NOT DETECTED Final   Norovirus GI/GII NOT DETECTED NOT DETECTED Final   Rotavirus A NOT DETECTED NOT DETECTED Final   Sapovirus (I, II, IV, and V) NOT DETECTED NOT DETECTED Final    Comment: Performed at [facility], [address], [place], [place] [address]  C Difficile Quick Screen w PCR reflex      Status: None   Collection Time: 12/17/21  6:52 AM   Specimen: Stool  Result Value Ref Range Status   C Diff antigen NEGATIVE NEGATIVE Final   C Diff toxin NEGATIVE NEGATIVE Final   C Diff interpretation No C. difficile detected.  Final    Comment: Performed at [facility], [place] [address], [place], [place] [address]         Radiology Studies: US THORACENTESIS ASP PLEURAL SPACE W/IMG GUIDE  Result Date: 12/23/2021 INDICATION: Patient with history of serous adenocarcinoma of right ovary stage III. Patient was noted to have bilateral pleural effusions on imaging left greater than right. Request for diagnostic and therapeutic thoracentesis. Upon ultrasound left greater than right. EXAM: ULTRASOUND GUIDED DIAGNOSTIC AND THERAPEUTIC LEFT THORACENTESIS MEDICATIONS: 10 mL 1 % lidocaine COMPLICATIONS: None immediate. PROCEDURE: An ultrasound guided thoracentesis was thoroughly discussed with the patient and questions answered. The benefits, risks, alternatives and complications were also discussed. The patient understands and wishes to proceed with the procedure. Written consent was obtained. Ultrasound was performed to localize and mark an adequate pocket of fluid in the LEFT chest. The area was then prepped and draped in the normal sterile fashion. 1% Lidocaine was used for local anesthesia. Under ultrasound guidance a 6 Fr Safe-T-Centesis catheter was introduced. Thoracentesis was performed. The catheter was removed and a dressing applied. FINDINGS: A total of approximately 100 mL of clear, amber fluid was removed. Samples were sent to the laboratory as requested by the clinical team. IMPRESSION: Successful ultrasound guided LEFT thoracentesis yielding 800 mL of pleural fluid. Read by: Narda Rutherford, AGNP-BC Electronically Signed   By: Michaelle Birks M.D.   On: 12/23/2021 16:06   DG Chest Port 1 View  Result Date: 12/23/2021 CLINICAL DATA:  Post thoracentesis EXAM: PORTABLE CHEST 1  VIEW COMPARISON:  Previous studies including the examination of 12/21/2021 FINDINGS: There is marked interval decrease in left pleural effusion. There is no demonstrable left apical pneumothorax. There is questionable thin linear lucency in the medial left mid lung field. There is faint residual haziness in the left lower lung fields suggesting layering of small pleural effusion. There is a small to moderate right pleural effusion with no significant interval change. There are no signs of pulmonary edema. Right IJ chest port is noted with its tip in superior vena cava. There is moderate gaseous distention of colon. IMPRESSION: There is marked interval decrease in left pleural effusion after thoracentesis. There is no demonstrable left apical pneumothorax. There is possible tiny pneumothorax in the medial left mid lung field. No significant interval changes  are noted in moderate right pleural effusion. Evaluation of right lower lung field for infiltrates is limited by the effusion. Electronically Signed   By: Elmer Picker M.D.   On: 12/23/2021 15:45        Scheduled Meds:  acyclovir ointment   Topical 5 X Daily   antiseptic oral rinse  15 mL Mouth Rinse Q4H   Chlorhexidine Gluconate Cloth  6 each Topical q morning   enoxaparin (LOVENOX) injection  40 mg Subcutaneous Q24H   feeding supplement  1 Container Oral TID BM   fiber supplement (BANATROL TF)  60 mL Oral BID   lipase/protease/amylase  36,000 Units Oral TID AC   megestrol  400 mg Oral BID   midodrine  10 mg Oral TID WC   mirtazapine  15 mg Oral QHS   multivitamin with minerals  1 tablet Oral Daily   pantoprazole  40 mg Oral BID   venlafaxine  37.5 mg Oral BID WC   Continuous Infusions:   LOS: 8 days    Time spent: 49 minutes spent on chart review, discussion with nursing staff, consultants, updating family and interview/physical exam; more than 50% of that time was spent in counseling and/or coordination of care.    Chanin Frumkin J  [place] DO [facility] Available via Epic secure chat 7am-7pm After these hours, please refer to coverage provider listed on [email or web address] 12/24/2021, 12:08 PM

## 2021-12-24 NOTE — Procedures (Signed)
PROCEDURE SUMMARY:  Successful US guided therapeutic right thoracentesis. Yielded 400 cc of clear, amber fluid. Pt tolerated procedure well. No immediate complications.  Specimen not sent for labs. CXR ordered.  EBL < 1 mL  Tyson Alias, AGNP 12/24/2021 1:28 PM

## 2021-12-24 NOTE — Progress Notes (Signed)
Patient continues to be hospitalized with little improvement. Will continue to follow for post discharge needs and office follow up.   Oncology Nurse Navigator Documentation     12/24/2021    9:30 AM  Oncology Nurse Navigator Flowsheets  Navigator Follow Up Date: 12/31/2021  Navigator Follow Up Reason: Appointment Review  Navigator Location [facility]  Navigator Encounter Type Appt/Treatment Plan Review  Patient Visit Type MedOnc  Treatment Phase Active Tx  Barriers/Navigation Needs Coordination of Care;Education  Interventions None Required  Acuity Level 2-Minimal Needs (1-2 Barriers Identified)  Support Groups/Services Friends and Family  Time Spent with Patient 15

## 2021-12-24 NOTE — Progress Notes (Signed)
Physical Therapy Treatment Patient Details Name: Maria Romero MRN: [id number] DOB: 1950-10-25 Today's Date: 12/24/2021   History of Present Illness Maria Romero is a 71 y.o. female admitted 9/6 who presents with increasing weakness and persistent diarrhea. could not tolerate thoracentesis Fri 9/8 due to hypotension.  PMH:  stage IIIC serous adenocarinoma of the right ovary on neoadjuvant chemotherapy requiring therapeutic paracentesis, HTN    PT Comments    Pt NOT progressing as well as expected.  Will update LPT. General Comments: AxO x 3 very pleasant Lady "I was at the [facility] when all this started".  Pt feeling "very weak".  Pt required + 2 assist for all transfers. General transfer comment: Very weak and unable to support her body weight as B knees buckled.  First assisted from bed to [facility] barely completing 1/4 pivot turn with incontrolled stand to sit.  Second, had to assist off BSC via "Bear Hug" while blocking knees while Rehab Tech switched BSC with recliner from behind.  + 2 Max Assist to scoot to back of recliner. Positioned to comfort with multiple pillows.  Instructed pt to call RN when she wanted to go back to bed.  No minimal time discussed.  General Gait Details: unable to support her weight/profoundly weak/B knees buckle Pt will need ST Rehab at SNF to address mobility and functional decline prior to safely returning home. Will consult LPT.  Recommendations for follow up therapy are one component of a multi-disciplinary discharge planning process, led by the attending physician.  Recommendations may be updated based on patient status, additional functional criteria and insurance authorization.  Follow Up Recommendations  Skilled nursing-short term rehab (<3 hours/day)     Assistance Recommended at Discharge Frequent or constant Supervision/Assistance  Patient can return home with the following A little help with walking and/or transfers;A little help with  bathing/dressing/bathroom;Assistance with cooking/housework;Direct supervision/assist for medications management;Direct supervision/assist for financial management;Assist for transportation;Help with stairs or ramp for entrance   Equipment Recommendations  Wheelchair (measurements PT);Wheelchair cushion (measurements PT)    Recommendations for Other Services       Precautions / Restrictions Precautions Precautions: Fall Precaution Comments: profoundly weak Restrictions Weight Bearing Restrictions: No     Mobility  Bed Mobility Overal bed mobility: Needs Assistance Bed Mobility: Supine to Sit     Supine to sit: Mod assist, Max assist     General bed mobility comments: required increased assist to transfer to EOB using bed pad to complete scooting.  Required a seated break with MAX c/o fatigue.    Transfers Overall transfer level: Needs assistance Equipment used: None, Rolling walker (2 wheels) Transfers: Sit to/from Stand, Bed to chair/wheelchair/BSC Sit to Stand: Max assist, Total assist, +2 physical assistance, +2 safety/equipment, From elevated surface Stand pivot transfers: Total assist, Max assist, +2 physical assistance, +2 safety/equipment         General transfer comment: Very weak and unable to support her body weight as B knees buckled.  First assisted from bed to [facility] barely completing 1/4 pivot turn with incontrolled stand to sit.  Second, had to assist off BSC via "Bear Hug" while blocking knees while Rehab Tech switched BSC with recliner from behind.  + 2 Max Assist to scoot to back of recliner.    Ambulation/Gait               General Gait Details: unable to support her weight/profoundly weak/B knees buckle   Stairs  Wheelchair Mobility    Modified Rankin (Stroke Patients Only)       Balance                                            Cognition Arousal/Alertness: Awake/alert Behavior During Therapy: WFL  for tasks assessed/performed Overall Cognitive Status: Within Functional Limits for tasks assessed                                 General Comments: AxO x 3 very pleasant Lady "I was at the [place] when all this started".  Pt feeling "very weak".        Exercises      General Comments        Pertinent Vitals/Pain Pain Assessment Pain Assessment: No/denies pain    Home Living                          Prior Function            PT Goals (current goals can now be found in the care plan section)      Frequency    Min 3X/week      PT Plan Current plan remains appropriate    Co-evaluation              AM-PAC PT "6 Clicks" Mobility   Outcome Measure  Help needed turning from your back to your side while in a flat bed without using bedrails?: A Lot Help needed moving from lying on your back to sitting on the side of a flat bed without using bedrails?: A Lot Help needed moving to and from a bed to a chair (including a wheelchair)?: A Lot Help needed standing up from a chair using your arms (e.g., wheelchair or bedside chair)?: Total Help needed to walk in hospital room?: Total Help needed climbing 3-5 steps with a railing? : Total 6 Click Score: 9    End of Session Equipment Utilized During Treatment: Gait belt Activity Tolerance: Patient limited by fatigue Patient left: in chair;with call bell/phone within reach Nurse Communication: Mobility status PT Visit Diagnosis: Muscle weakness (generalized) (M62.81);Difficulty in walking, not elsewhere classified (R26.2)     Time: 4196-2229 PT Time Calculation (min) (ACUTE ONLY): 26 min  Charges:  $Therapeutic Activity: 23-37 mins                     Rica Koyanagi  PTA Acute  Rehabilitation Services Office M-F          [phone] Weekend pager [phone]

## 2021-12-25 DIAGNOSIS — N179 Acute kidney failure, unspecified: Secondary | ICD-10-CM | POA: Diagnosis not present

## 2021-12-25 DIAGNOSIS — R0602 Shortness of breath: Secondary | ICD-10-CM | POA: Diagnosis not present

## 2021-12-25 DIAGNOSIS — R188 Other ascites: Secondary | ICD-10-CM | POA: Diagnosis not present

## 2021-12-25 DIAGNOSIS — D701 Agranulocytosis secondary to cancer chemotherapy: Secondary | ICD-10-CM | POA: Diagnosis not present

## 2021-12-25 DIAGNOSIS — J9601 Acute respiratory failure with hypoxia: Secondary | ICD-10-CM | POA: Diagnosis not present

## 2021-12-25 DIAGNOSIS — R197 Diarrhea, unspecified: Secondary | ICD-10-CM | POA: Diagnosis not present

## 2021-12-25 DIAGNOSIS — C569 Malignant neoplasm of unspecified ovary: Secondary | ICD-10-CM | POA: Diagnosis not present

## 2021-12-25 LAB — COMPREHENSIVE METABOLIC PANEL
ALT: 14 U/L (ref 0–44)
AST: 19 U/L (ref 15–41)
Albumin: 1.9 g/dL — ABNORMAL LOW (ref 3.5–5.0)
Alkaline Phosphatase: 244 U/L — ABNORMAL HIGH (ref 38–126)
Anion gap: 7 (ref 5–15)
BUN: 31 mg/dL — ABNORMAL HIGH (ref 8–23)
CO2: 19 mmol/L — ABNORMAL LOW (ref 22–32)
Calcium: 9 mg/dL (ref 8.9–10.3)
Chloride: 110 mmol/L (ref 98–111)
Creatinine, Ser: 1.19 mg/dL — ABNORMAL HIGH (ref 0.44–1.00)
GFR, Estimated: 49 mL/min — ABNORMAL LOW (ref >60)
Glucose, Bld: 112 mg/dL — ABNORMAL HIGH (ref 70–99)
Potassium: 4.3 mmol/L (ref 3.5–5.1)
Sodium: 136 mmol/L (ref 135–145)
Total Bilirubin: 0.5 mg/dL (ref 0.3–1.2)
Total Protein: 4.9 g/dL — ABNORMAL LOW (ref 6.5–8.1)

## 2021-12-25 LAB — CBC WITH DIFFERENTIAL/PLATELET
Abs Immature Granulocytes: 3.19 10*3/uL — ABNORMAL HIGH (ref 0.00–0.07)
Basophils Absolute: 0 10*3/uL (ref 0.0–0.1)
Basophils Relative: 0 %
Eosinophils Absolute: 0 10*3/uL (ref 0.0–0.5)
Eosinophils Relative: 0 %
HCT: 36.7 % (ref 36.0–46.0)
Hemoglobin: 12 g/dL (ref 12.0–15.0)
Immature Granulocytes: 9 %
Lymphocytes Relative: 6 %
Lymphs Abs: 2 10*3/uL (ref 0.7–4.0)
MCH: 29 pg (ref 26.0–34.0)
MCHC: 32.7 g/dL (ref 30.0–36.0)
MCV: 88.6 fL (ref 80.0–100.0)
Monocytes Absolute: 1.5 10*3/uL — ABNORMAL HIGH (ref 0.1–1.0)
Monocytes Relative: 4 %
Neutro Abs: 30.3 10*3/uL — ABNORMAL HIGH (ref 1.7–7.7)
Neutrophils Relative %: 81 %
Platelets: 278 10*3/uL (ref 150–400)
RBC: 4.14 MIL/uL (ref 3.87–5.11)
RDW: 15.3 % (ref 11.5–15.5)
WBC: 37 10*3/uL — ABNORMAL HIGH (ref 4.0–10.5)
nRBC: 0 % (ref 0.0–0.2)

## 2021-12-25 NOTE — Progress Notes (Signed)
Ms. Axley might be a little bit better today.  She does seem a little bit stronger.  She apparently sat in the chair for 3 hours yesterday.  She says that her appetite might be doing a little bit better.  She did have a thoracentesis on the right side.  40 cc of fluid was removed.  Her white cell count is trending up.  This, hopefully, secondary to the Neulasta that she got in the office.  She has a vast predominance of neutrophils.  Her hemoglobin is holding steady at 12.  Hopefully, the transfusion she got's help.  Her BUN is 31 creatinine 1.19.  Her albumin is 1.9.  We are doing a calorie count on her to see how much she really is taking in.  Again, she does sound a little bit better today.  She is had 1 bout of diarrhea.  Her stool cultures were all negative.  She does have the fever blister on her upper lip.  This appears to be drying up a little bit.  Maybe, her immune system is down a little bit.  I think she may benefit from some oral antiviral.  She is not hurting.  I know that she is trying her best.  Hopefully, we are seeing some improvement.  It would be very nice to get that Foley catheter out her.  I think this is just a source of potential infection for her.  I will check a urine culture on her.  She has had no fever.  Her vital signs are temperature 97.7.  Pulse 90.  Blood pressure 118/73.  Oxygen saturation is 97% on 3 L.  Her lungs do sound clear bilaterally.  I hear no wheezing.  Cardiac exam regular rate and rhythm with no murmurs, rubs or bruits.  Abdomen is soft.  Really does not appear to be distended.  She has no obvious fluid wave.  There is no guarding or rebound tenderness.  Extremity shows no clubbing, cyanosis or edema.  Neurological exam is nonfocal.  We will be incredibly interesting to see what the calorie count is when this is all done.  Again, it would be nice to try to get the Foley catheter out to her and see how she does urinating.  I would expect  that the white cell count should start trending downward.  Again I have to believe this is from the Neulasta that she got in the office.  Again, I suspect she is going to have to go to some kind of rehab facility before she is able to go home.  I always have a good prayer with her.  I know her faith is strong.  Lattie Haw, MD  Proverbs 22:4

## 2021-12-25 NOTE — TOC Progression Note (Signed)
Transition of Care [facility]) - Progression Note    Patient Details  Name: Maria Romero MRN: [id number] Date of Birth: 03/22/1951  Transition of Care [facility]) CM/SW Contact  Ross Ludwig, [place] Phone Number: 12/25/2021, 5:55 PM  Clinical Narrative:     CSW was informed that patient will need SNF for short term rehab, CSW to begin bed search in [facility].  CSW awaiting for bed offers.  Expected Discharge Plan: [place] Barriers to Discharge: Continued Medical Work up  Expected Discharge Plan and Services Expected Discharge Plan: [place] In-house Referral: NA Discharge Planning Services: CM Consult Post Acute Care Choice: [place] arrangements for the past 2 months: Single Family Home                 DME Arranged: N/A DME Agency: NA       HH Arranged: PT HH Agency: [place] Date [place]: 12/22/21 Time [place]: [id number] Representative spoke with at [place]: [place] Determinants of Health ([place]) Interventions    Readmission Risk Interventions     No data to display

## 2021-12-25 NOTE — Progress Notes (Signed)
PROGRESS NOTE    Maria Romero  OHY:[id number] DOB: 1950/05/09 DOA: 12/15/2021 PCP: Hali Marry, MD    Brief Narrative:   Maria Romero is a 71 y.o. 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  Patient recently started on chemotherapy with carboplatinum/Taxol on 8/29 and subsequent 3 days later she began to have persistent diarrhea with decreased oral intake.  Patient denied any nausea/vomiting and no fever/chills.  She was seen in the oncology office on 9/5 and given IV fluids as well as Neulasta for white cell count of 800.  She returned to the oncology office due to continued symptoms and generalized weakness.  She was then advised to present to the ED for further evaluation.  In the ED, patient was afebrile, tachycardic to 101 bpm, tachypneic.  WBC of 700.  Hemoglobin 11.3, sodium 126, potassium 5.6, creatinine 1.65.  Albumin 1.9.  Chest x-ray showing increasing left pleural effusion with vascular congestion.  Patient was given IV albumin, Lokelma and 1 L normal saline fluid and started on continuous fluid.  Patient was transferred to [facility] under the hospitalist service.  Assessment & Plan:   Acute respiratory failure with hypoxia Pleural effusion Chest x-ray with moderate bilateral pleural effusions.  Continues require 3 L nasal cannula.  Underwent IR ultrasound-guided left thoracentesis on 9/14 with 800 mL fluid removed and left thoracentesis on 9/15 with 435m removed --Continue supplemental oxygen, maintain SPO2 greater than 92%  Acute renal failure: Resolved Creatinine 1.65 on admission, which has progressively been increasing since a month ago where  creatinine was 0.93. --Cr 1.65>>1.20>0.99>0.94>1.19  Hyperkalemia: Resolved Treated with Lokelma on admission, now resolved.  Potassium 3.8 today. --BMP daily  Ovarian cancer, right stage IIIc Follows with medical oncology outpatient, Dr. EMarin Olp  Completed first session of neoadjuvant chemotherapy on 12/07/2021.  Overall appears very deconditioned and unclear how well she will tolerate continued chemotherapy given her current functional status.  Overall very poor prognosis  Malignant ascites Underwent paracentesis on 9/7 with 5 L removed.  Abdomen continues to be more tense, underwent repeat paracentesis on 9/13 with 4 L removed. --Strict I's and O's  Orthostatic hypotension --Orthostatic vital signs daily --Midodrine '10mg'$  p.o. 3 times daily  Acute urinary retention --Foley catheter placed --Plan voiding trial today  Neutropenia: Resolved WBC count 0.5 on admission with ANC 0.0.  Received Neulasta in oncology clinic. --WBC 0.5>>16.98>23.3>21.8>26.2>37.0 --CBC daily  Diarrhea: Likely secondary to chemotherapy.  Slowly improving.  C. difficile and GI PCR panel negative. --Lomotil 4 times daily as needed --Supportive care  Anemia of chronic medical disease Transfuse 1 unit PRBC on 9/14.  Hemoglobin up to 12.0.  Hyperlipidemia: Lipitor 40 mg p.o. daily  GERD --Protonix 40 mg p.o. twice daily  Depression: --Started on venlafaxine 37.5 mg p.o. twice daily  Weakness/deconditioning/debility: --PT now recommending SNF placement --TOC for evaluation  Moderate protein calorie malnutrition Body mass index is 27.14 kg/m. Nutrition Status: Nutrition Problem: Moderate Malnutrition Etiology: chronic illness, cancer and cancer related treatments Signs/Symptoms: mild fat depletion, mild muscle depletion Interventions: [place]ze, MVI -- Dietitian following, encourage increased oral intake, supplementation, currently undergoing calorie count     DVT prophylaxis: Place TED  hose Start: 12/21/21 0921 enoxaparin (LOVENOX) injection 40 mg Start: 12/18/21 1000 SCDs Start: 12/15/21  2000    Code Status: Full Code Family Communication: No family present at bedside this morning  Disposition Plan:  Level of care: Progressive Status is: Inpatient Remains inpatient appropriate because: Pending repeat thoracentesis on right today, PT now recommends SNF placement, TOC for evaluation    Consultants:  Medical oncology, Dr. Marin Olp Interventional radiology  Procedures:  Paracentesis 9/7, 5 L removed Paracentesis 9/13, 4 L removed Left Thoracentesis 9/14: 843m removed Right thoracentesis 9/15: 4072mremoved  Antimicrobials:  Ceftriaxone 9/6 - 9/7 Metronidazole 9/6 - 9/8   Subjective: Patient seen examined bedside, resting comfortably.  Lying in bed.  States shortness of breath much improved today following repeat left thoracentesis yesterday.  Therapy now recommending SNF placement.  Seen by medical oncology, Dr. EnMarin Olphis morning; will attempt voiding trial. No other complaints or concerns at this time.  Denies headache, no dizziness, no chest pain, no palpitations, no fever/chills/night sweats, no nausea/vomiting/diarrhea, no focal weakness, no cough/congestion, no paresthesias.  No acute events overnight per nursing staff.  Objective: Vitals:   12/25/21 0359 12/25/21 0400 12/25/21 0454 12/25/21 0500  BP: 118/73     Pulse: 100   90  Resp: 20 (!) 24 17   Temp: 97.7 F (36.5 C)     TempSrc: Oral     SpO2: 97%     Weight:      Height:        Intake/Output Summary (Last 24 hours) at 12/25/2021 1127 Last data filed at 12/25/2021 1052 Gross per 24 hour  Intake 440 ml  Output 450 ml  Net -10 ml   Filed Weights   12/15/21 2000  Weight: 69.5 kg    Examination:  Physical Exam: GEN: NAD, alert and oriented x 3, chronically ill/fatigued in appearance HEENT: NCAT, PERRL, EOMI, sclera clear, MMM PULM: Breath sounds diminished bilateral bases with mild  crackles, normal respiratory effort without accessory muscle use, no wheezing, on 3 L nasal cannula with SPO2 97% at rest CV: RRR w/o M/G/R, Port-A-Cath noted right chest GI: abd soft, NTND, NABS, no R/G/M GU: Foley catheter noted draining clear yellow urine in collection bag MSK: no peripheral edema, muscle strength globally intact 5/5 bilateral upper/lower extremities NEURO: CN II-XII intact, no focal deficits, moves all extremities independently Integumentary: dry/intact, no rashes or wounds    Data Reviewed: I have personally reviewed following labs and imaging studies  CBC: Recent Labs  Lab 12/20/21 0304 12/21/21 0336 12/22/21 0340 12/23/21 0500 12/24/21 0334 12/25/21 0340  WBC 15.3* 16.9* 23.3* 21.8* 26.2* 37.0*  NEUTROABS 11.5* 13.3* 18.5*  --  20.6* 30.3*  HGB 11.5* 10.2* 10.4* 9.5* 12.2 12.0  HCT 35.9* 30.8* 31.4* 29.3* 37.0 36.7  MCV 89.5 89.0 89.5 89.6 87.1 88.6  PLT 97* 126* 162 189 223 [id number] Basic Metabolic Panel: Recent Labs  Lab 12/21/21 0336 12/22/21 0340 12/23/21 0500 12/24/21 0334 12/25/21 0340  NA 130* 132* 132* 133* 136  K 4.2 4.5 3.7 3.8 4.3  CL 106 107 109 108 110  CO2 16* 17* 17* 19* 19*  GLUCOSE 97 117* 90 98 112*  BUN 45* 45* 36* 33* 31*  CREATININE 1.19* 1.20* 0.99 0.94 1.19*  CALCIUM 8.4* 8.7* 8.4* 8.5* 9.0   GFR: Estimated Creatinine Clearance: 39.9 mL/min (A) (by C-G formula based on SCr of 1.19 mg/dL (H)). Liver Function Tests: Recent Labs  Lab 12/20/21 0304 12/21/21 0336 12/22/21 0340 12/24/21 0334 12/25/21 0340  AST '18 17 21 21 19  '$ ALT '14 12 13 13 '$ 14  ALKPHOS 178* 144* 197* 222* 244*  BILITOT 0.6 0.8 0.6 0.6 0.5  PROT 5.2* 5.4* 5.6* 4.8* 4.9*  ALBUMIN 2.0* 2.5* 2.3* 1.9* 1.9*   No results for input(s): "LIPASE", "AMYLASE" in the last 168 hours. No results for input(s): "AMMONIA" in the last 168 hours. Coagulation Profile: No results for input(s): "INR", "PROTIME" in the last 168 hours. Cardiac Enzymes: No results for  input(s): "CKTOTAL", "CKMB", "CKMBINDEX", "TROPONINI" in the last 168 hours. BNP (last 3 results) No results for input(s): "PROBNP" in the last 8760 hours. HbA1C: No results for input(s): "HGBA1C" in the last 72 hours. CBG: No results for input(s): "GLUCAP" in the last 168 hours.  Lipid Profile: No results for input(s): "CHOL", "HDL", "LDLCALC", "TRIG", "CHOLHDL", "LDLDIRECT" in the last 72 hours. Thyroid Function Tests: No results for input(s): "TSH", "T4TOTAL", "FREET4", "T3FREE", "THYROIDAB" in the last 72 hours. Anemia Panel: No results for input(s): "VITAMINB12", "FOLATE", "FERRITIN", "TIBC", "IRON", "RETICCTPCT" in the last 72 hours. Sepsis Labs: No results for input(s): "PROCALCITON", "LATICACIDVEN" in the last 168 hours.   Recent Results (from the past 240 hour(s))  Culture, blood (routine x 2)     Status: None   Collection Time: 12/15/21 12:25 PM   Specimen: Porta Cath; Blood  Result Value Ref Range Status   Specimen Description   Final    PORTA CATH RIGHT Performed at [facility], [address], [place], [place] [address]    Special Requests   Final    BOTTLES DRAWN AEROBIC AND ANAEROBIC Blood Culture adequate volume Performed at [facility], [address] [place], [place] [address]    Culture   Final    NO GROWTH 5 DAYS Performed at [facility], [place] [address], [place], [place] [address]    Report Status 12/20/2021 FINAL  Final  Culture, blood (routine x 2)     Status: None   Collection Time: 12/15/21  7:44 PM   Specimen: BLOOD  Result Value Ref Range Status   Specimen Description   Final    BLOOD BLOOD LEFT ARM Performed at [facility] [address], [place], [place] [address]    Special Requests   Final    BOTTLES DRAWN AEROBIC ONLY Blood Culture results may not be optimal due to an inadequate volume of blood received in culture bottles Performed at [facility] [address]  [place], [place] [address]    Culture   Final    NO GROWTH 5 DAYS Performed at [facility], [place] [address], [place],  [address]    Report Status 12/20/2021 FINAL  Final  Gastrointestinal Panel by PCR , Stool     Status: None   Collection Time: 12/17/21  6:52 AM   Specimen: Stool  Result Value Ref Range Status   Campylobacter species NOT DETECTED NOT DETECTED Final   Plesimonas shigelloides NOT DETECTED NOT DETECTED Final   Salmonella species NOT DETECTED NOT DETECTED Final   Yersinia enterocolitica NOT DETECTED NOT DETECTED Final   Vibrio species NOT DETECTED NOT DETECTED Final   Vibrio cholerae NOT DETECTED NOT DETECTED Final   Enteroaggregative E coli (EAEC) NOT DETECTED NOT DETECTED Final   Enteropathogenic E coli (EPEC) NOT DETECTED NOT DETECTED Final   Enterotoxigenic E coli (ETEC) NOT DETECTED NOT DETECTED Final   Shiga like toxin producing E coli (STEC) NOT DETECTED NOT DETECTED Final   Shigella/Enteroinvasive E coli (EIEC) NOT DETECTED NOT DETECTED Final  Cryptosporidium NOT DETECTED NOT DETECTED Final   Cyclospora cayetanensis NOT DETECTED NOT DETECTED Final   Entamoeba histolytica NOT DETECTED NOT DETECTED Final   Giardia lamblia NOT DETECTED NOT DETECTED Final   Adenovirus F40/41 NOT DETECTED NOT DETECTED Final   Astrovirus NOT DETECTED NOT DETECTED Final   Norovirus GI/GII NOT DETECTED NOT DETECTED Final   Rotavirus A NOT DETECTED NOT DETECTED Final   Sapovirus (I, II, IV, and V) NOT DETECTED NOT DETECTED Final    Comment: Performed at [facility], [address], [place], [place] [address]  C Difficile Quick Screen w PCR reflex     Status: None   Collection Time: 12/17/21  6:52 AM   Specimen: Stool  Result Value Ref Range Status   C Diff antigen NEGATIVE NEGATIVE Final   C Diff toxin NEGATIVE NEGATIVE Final   C Diff interpretation No C. difficile detected.  Final    Comment: Performed at [facility], [place] [address],  [place],  [address]         Radiology Studies: [facility] Chest Port 1 View  Result Date: 12/24/2021 CLINICAL DATA:  Status post right thoracentesis. EXAM: PORTABLE CHEST 1 VIEW COMPARISON:  Chest x-ray from yesterday. FINDINGS: Unchanged right chest wall port catheter. The heart size and mediastinal contours are within normal limits. Essentially resolved right pleural effusion status post thoracentesis. No pneumothorax. Recurrent small left pleural effusion. Improved right basilar and increased left basilar atelectasis. No acute osseous abnormality. IMPRESSION: 1. Essentially resolved right pleural effusion status post thoracentesis. No pneumothorax. 2. Recurrent small left pleural effusion. Electronically Signed   By: Titus Dubin M.D.   On: 12/24/2021 13:50   US THORACENTESIS ASP PLEURAL SPACE W/IMG GUIDE  Result Date: 12/24/2021 INDICATION: Patient with history of serous adenocarcinoma of right ovary stage III. Request for therapeutic right thoracentesis. EXAM: ULTRASOUND GUIDED THERAPEUTIC RIGHT THORACENTESIS MEDICATIONS: 10 mL 1 % lidocaine COMPLICATIONS: None immediate. PROCEDURE: An ultrasound guided thoracentesis was thoroughly discussed with the patient and questions answered. The benefits, risks, alternatives and complications were also discussed. The patient understands and wishes to proceed with the procedure. Written consent was obtained. Ultrasound was performed to localize and mark an adequate pocket of fluid in the right chest. The area was then prepped and draped in the normal sterile fashion. 1% Lidocaine was used for local anesthesia. Under ultrasound guidance a 6 Fr Safe-T-Centesis catheter was introduced. Thoracentesis was performed. The catheter was removed and a dressing applied. FINDINGS: A total of approximately 400 cc of clear, amber fluid was removed. IMPRESSION: Successful ultrasound guided right thoracentesis yielding 400 cc of pleural fluid. Read by: Narda Rutherford, AGNP-BC  Electronically Signed   By: Miachel Roux M.D.   On: 12/24/2021 13:32   US THORACENTESIS ASP PLEURAL SPACE W/IMG GUIDE  Result Date: 12/23/2021 INDICATION: Patient with history of serous adenocarcinoma of right ovary stage III. Patient was noted to have bilateral pleural effusions on imaging left greater than right. Request for diagnostic and therapeutic thoracentesis. Upon ultrasound left greater than right. EXAM: ULTRASOUND GUIDED DIAGNOSTIC AND THERAPEUTIC LEFT THORACENTESIS MEDICATIONS: 10 mL 1 % lidocaine COMPLICATIONS: None immediate. PROCEDURE: An ultrasound guided thoracentesis was thoroughly discussed with the patient and questions answered. The benefits, risks, alternatives and complications were also discussed. The patient understands and wishes to proceed with the procedure. Written consent was obtained. Ultrasound was performed to localize and mark an adequate pocket of fluid in the LEFT chest. The area was then prepped and draped in the  normal sterile fashion. 1% Lidocaine was used for local anesthesia. Under ultrasound guidance a 6 Fr Safe-T-Centesis catheter was introduced. Thoracentesis was performed. The catheter was removed and a dressing applied. FINDINGS: A total of approximately 100 mL of clear, amber fluid was removed. Samples were sent to the laboratory as requested by the clinical team. IMPRESSION: Successful ultrasound guided LEFT thoracentesis yielding 800 mL of pleural fluid. Read by: Narda Rutherford, AGNP-BC Electronically Signed   By: Michaelle Birks M.D.   On: 12/23/2021 16:06   DG Chest Port 1 View  Result Date: 12/23/2021 CLINICAL DATA:  Post thoracentesis EXAM: PORTABLE CHEST 1 VIEW COMPARISON:  Previous studies including the examination of 12/21/2021 FINDINGS: There is marked interval decrease in left pleural effusion. There is no demonstrable left apical pneumothorax. There is questionable thin linear lucency in the medial left mid lung field. There is faint residual haziness in  the left lower lung fields suggesting layering of small pleural effusion. There is a small to moderate right pleural effusion with no significant interval change. There are no signs of pulmonary edema. Right IJ chest port is noted with its tip in superior vena cava. There is moderate gaseous distention of colon. IMPRESSION: There is marked interval decrease in left pleural effusion after thoracentesis. There is no demonstrable left apical pneumothorax. There is possible tiny pneumothorax in the medial left mid lung field. No significant interval changes are noted in moderate right pleural effusion. Evaluation of right lower lung field for infiltrates is limited by the effusion. Electronically Signed   By: Elmer Picker M.D.   On: 12/23/2021 15:45        Scheduled Meds:  acyclovir ointment   Topical 5 X Daily   antiseptic oral rinse  15 mL Mouth Rinse Q4H   Chlorhexidine Gluconate Cloth  6 each Topical q morning   enoxaparin (LOVENOX) injection  40 mg Subcutaneous Q24H   feeding supplement  1 Container Oral TID BM   fiber supplement (BANATROL TF)  60 mL Oral BID   lipase/protease/amylase  36,000 Units Oral TID AC   megestrol  400 mg Oral BID   midodrine  10 mg Oral TID WC   mirtazapine  15 mg Oral QHS   multivitamin with minerals  1 tablet Oral Daily   pantoprazole  40 mg Oral BID   venlafaxine  37.5 mg Oral BID WC   Continuous Infusions:   LOS: 9 days    Time spent: 49 minutes spent on chart review, discussion with nursing staff, consultants, updating family and interview/physical exam; more than 50% of that time was spent in counseling and/or coordination of care.    [place] J [place] DO [facility] Available via Epic secure chat 7am-7pm After these hours, please refer to coverage provider listed on [email or web address] 12/25/2021, 11:27 AM

## 2021-12-25 NOTE — NC FL2 (Signed)
[place] LEVEL OF CARE SCREENING TOOL     IDENTIFICATION  Patient Name: Maria Romero Birthdate: 1951-01-15 Sex: female Admission Date (Current Location): 12/15/2021  [facility] and [place] Number:  Herbalist and Address:  [facility],  [name], [place]      Provider Number: [id number]  Attending Physician Name and Address:  [place] Zuleyma Scharf J, DO  Relative Name and Phone Number:  Charisse Klinefelter  [phone]    Beeson,Brad Relative   [phone]    Current Level of Care: Hospital Recommended Level of Care: [place] Prior Approval Number:    Date Approved/Denied:   PASRR Number: [id number]  Discharge Plan: SNF    Current Diagnoses: Patient Active Problem List   Diagnosis Date Noted   Orthostatic hypotension 12/20/2021   Malnutrition of moderate degree 12/17/2021   Neutropenia ([place]) 12/15/2021   Hyponatremia 12/15/2021   Hyperkalemia 12/15/2021   AKI (acute kidney injury) ([place]) 12/15/2021   Pleural effusion on left 12/15/2021   Acute respiratory failure with hypoxia (HCC) 12/15/2021   Diarrhea 12/15/2021   Neutropenia, drug-induced ([place]) 12/14/2021   [name], right ([place]) 12/01/2021   Goals of care, counseling/discussion 12/01/2021   Malignant ascites 11/19/2021   Abdominal pain 11/08/2021   Osteoporosis 05/05/2021   Essential hypertension 09/16/2015   Raynaud disease 02/09/2015   Bradycardia 08/25/2009   PALPITATIONS 08/25/2009   DYSPNEA 08/25/2009   Abnormal levels of other serum enzymes 11/13/2007   Disorder of bone and cartilage 05/22/2007   HYPERCHOLESTEROLEMIA 05/21/2007   IBS 05/21/2007   LEG PAIN, LEFT 05/21/2007    Orientation RESPIRATION BLADDER Height & Weight     Self, Time, Situation  O2 (3L) Incontinent Weight: 153 lb 3.5 oz (69.5 kg) Height:  '5\' 3"'$  (160 cm)  BEHAVIORAL SYMPTOMS/MOOD NEUROLOGICAL BOWEL NUTRITION STATUS      Continent Diet  AMBULATORY STATUS  COMMUNICATION OF NEEDS Skin     Verbally Normal                       Personal Care Assistance Level of Assistance              Functional Limitations Info  Sight, Hearing, Speech Sight Info: Adequate Hearing Info: Adequate Speech Info: Adequate    SPECIAL CARE FACTORS FREQUENCY  PT (By licensed PT), OT (By licensed OT)     PT Frequency: Minimum 5x a week OT Frequency: Minimum 5x a week            Contractures Contractures Info: Not present    Additional Factors Info  Code Status, Allergies, Psychotropic Code Status Info: Full Code Allergies Info: Fluvastatin Sodium Psychotropic Info: mirtazapine (REMERON SOL-TAB) disintegrating tablet 15 mg, venlafaxine (EFFEXOR) tablet 37.5 mg         Current Medications (12/25/2021):  This is the current hospital active medication list Current Facility-Administered Medications  Medication Dose Route Frequency Provider Last Rate Last Admin   acyclovir ointment (ZOVIRAX) 5 %   Topical 5 X Daily Volanda Napoleon, MD   Given at 12/25/21 1714   alum & mag hydroxide-simeth (MAALOX/MYLANTA) 200-200-20 MG/5ML suspension 30 mL  30 mL Oral Q6H PRN Eulogio Bear U, DO   30 mL at 12/18/21 1502   antiseptic oral rinse (BIOTENE) solution 15 mL  15 mL Mouth Rinse Q4H Volanda Napoleon, MD   15 mL at 12/25/21 1709   Chlorhexidine Gluconate Cloth 2 % PADS 6 each  6 each Topical  q morning Tu, Ching T, DO   6 each at 12/25/21 0932   diphenoxylate-atropine (LOMOTIL) 2.5-0.025 MG per tablet 1 tablet  1 tablet Oral QID PRN Volanda Napoleon, MD   1 tablet at 12/22/21 2141   enoxaparin (LOVENOX) injection 40 mg  40 mg Subcutaneous Q24H Volanda Napoleon, MD   40 mg at 12/25/21 0931   feeding supplement (BOOST / RESOURCE BREEZE) liquid 1 Container  1 Container Oral TID BM Vann, Jessica U, DO   1 Container at 12/25/21 1500   fiber supplement (BANATROL TF) liquid 60 mL  60 mL Oral BID Eulogio Bear U, DO   60 mL at 12/23/21 2208   levalbuterol (XOPENEX)  nebulizer solution 0.63 mg  0.63 mg Nebulization Q6H PRN Kathryne Eriksson, NP   0.63 mg at 12/21/21 2129   lip balm (CARMEX) ointment   Topical PRN Geradine Girt, DO       lipase/protease/amylase (CREON) capsule 36,000 Units  36,000 Units Oral TID Zoila Shutter, MD   36,000 Units at 12/25/21 1714   megestrol (MEGACE) 400 MG/10ML suspension 400 mg  400 mg Oral BID Volanda Napoleon, MD   400 mg at 12/25/21 0931   midodrine (PROAMATINE) tablet 10 mg  10 mg Oral TID WC [place] Donnamarie Poag, DO   10 mg at 12/25/21 1714   mirtazapine (REMERON SOL-TAB) disintegrating tablet 15 mg  15 mg Oral QHS Vann, Jessica U, DO   15 mg at 12/24/21 2214   multivitamin with minerals tablet 1 tablet  1 tablet Oral Daily Eulogio Bear U, DO   1 tablet at 12/25/21 0930   pantoprazole (PROTONIX) EC tablet 40 mg  40 mg Oral BID Vann, Jessica U, DO   40 mg at 12/25/21 0931   sodium chloride flush (NS) 0.9 % injection 10-40 mL  10-40 mL Intracatheter PRN Volanda Napoleon, MD   10 mL at 12/21/21 0342   venlafaxine (EFFEXOR) tablet 37.5 mg  37.5 mg Oral BID WC Volanda Napoleon, MD   37.5 mg at 12/25/21 1714     Discharge Medications: Please see discharge summary for a list of discharge medications.  Relevant Imaging Results:  Relevant Lab Results:   Additional Information SSN [id number]  Ross Ludwig, LCSW

## 2021-12-26 DIAGNOSIS — J9601 Acute respiratory failure with hypoxia: Secondary | ICD-10-CM | POA: Diagnosis not present

## 2021-12-26 DIAGNOSIS — N179 Acute kidney failure, unspecified: Secondary | ICD-10-CM | POA: Diagnosis not present

## 2021-12-26 DIAGNOSIS — R197 Diarrhea, unspecified: Secondary | ICD-10-CM | POA: Diagnosis not present

## 2021-12-26 DIAGNOSIS — D701 Agranulocytosis secondary to cancer chemotherapy: Secondary | ICD-10-CM | POA: Diagnosis not present

## 2021-12-26 MED ORDER — ALTEPLASE 2 MG IJ SOLR
2.0000 mg | Freq: Once | INTRAMUSCULAR | Status: AC
  Administered 2021-12-26: 2 mg
  Filled 2021-12-26: qty 2

## 2021-12-26 NOTE — Progress Notes (Signed)
PROGRESS NOTE    [name]  BWG:[id number] DOB: May 31, 1950 DOA: 12/15/2021 PCP: Hali Marry, MD    Brief Narrative:   Maria Romero is a 71 y.o. 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 ED on 9/6 by discretion of her oncologist for progressive shortness of breath, abdominal distention, diarrhea and generalized weakness.  Patient recently started on chemotherapy with carboplatinum/Taxol on 8/29 and subsequent 3 days later she began to have persistent diarrhea with decreased oral intake.  Patient denied any nausea/vomiting and no fever/chills.  She was seen in the oncology office on 9/5 and given IV fluids as well as Neulasta for white cell count of 800.  She returned to the oncology office due to continued symptoms and generalized weakness.  She was then advised to present to the ED for further evaluation.  In the ED, patient was afebrile, tachycardic to 101 bpm, tachypneic.  WBC of 700.  Hemoglobin 11.3, sodium 126, potassium 5.6, creatinine 1.65.  Albumin 1.9.  Chest x-ray showing increasing left pleural effusion with vascular congestion.  Patient was given IV albumin, Lokelma and 1 L normal saline fluid and started on continuous fluid.  Patient was transferred to [facility] under the hospitalist service.  Assessment & Plan:   Acute respiratory failure with hypoxia Pleural effusion Chest x-ray with moderate bilateral pleural effusions.  Continues require 3 L nasal cannula.  Underwent IR ultrasound-guided left thoracentesis on 9/14 with 800 mL fluid removed and left thoracentesis on 9/15 with 469m removed --Continue supplemental oxygen, maintain SPO2 greater than 92%  Acute renal failure: Resolved Creatinine 1.65 on admission, which has progressively been increasing since a month ago where  creatinine was 0.93. --Cr 1.65>>1.20>0.99>0.94>1.19  Hyperkalemia: Resolved Treated with Lokelma on admission, now resolved.  Potassium 3.8 today. --BMP daily  Ovarian cancer, right stage IIIc Follows with medical oncology outpatient, Dr. EMarin Olp  Completed first session of neoadjuvant chemotherapy on 12/07/2021.  Overall appears very deconditioned and unclear how well she will tolerate continued chemotherapy given her current functional status.  Overall very poor prognosis  Malignant ascites Underwent paracentesis on 9/7 with 5 L removed.  Abdomen continues to be more tense, underwent repeat paracentesis on 9/13 with 4 L removed. --Strict I's and O's  Orthostatic hypotension --Orthostatic vital signs daily --Midodrine '10mg'$  p.o. 3 times daily  Acute urinary retention: Resolved Initially required Foley catheter, voiding trial performed on 9/16 with success. -- Continue to monitor urinary output  Neutropenia: Resolved WBC count 0.5 on admission with ANC 0.0.  Received Neulasta in oncology clinic. --WBC 0.5>>16.98>23.3>21.8>26.2>37.0 --CBC daily  Diarrhea: Likely secondary to chemotherapy.  Slowly improving.  C. difficile and GI PCR panel negative. --Lomotil 4 times daily as needed --Supportive care  Anemia of chronic medical disease Transfuse 1 unit PRBC on 9/14.  Hemoglobin up to 12.0.  Hyperlipidemia: Lipitor 40 mg p.o. daily  GERD --Protonix 40 mg p.o. twice daily  Depression: --Started on venlafaxine 37.5 mg p.o. twice daily  Weakness/deconditioning/debility: --PT now recommending SNF placement --TOC for evaluation  Moderate protein calorie malnutrition Body mass index is 23.67 kg/m. Nutrition Status: Nutrition Problem: Moderate Malnutrition Etiology: chronic illness, cancer and cancer related treatments Signs/Symptoms: mild fat depletion, mild muscle depletion Interventions: Boost Breeze, MVI -- Dietitian following, encourage increased oral intake,  supplementation, currently undergoing calorie count     DVT prophylaxis: Place TED hose Start: 12/21/21 01779  enoxaparin (LOVENOX) injection 40 mg Start: 12/18/21 1000 SCDs Start: 12/15/21 2000    Code Status: Full Code Family Communication: No family present at bedside this morning  Disposition Plan:  Level of care: Med-Surg Status is: Inpatient Remains inpatient appropriate because: Pending SNF placement    Consultants:  Medical oncology, Dr. Marin Olp Interventional radiology  Procedures:  Paracentesis 9/7, 5 L removed Paracentesis 9/13, 4 L removed Left Thoracentesis 9/14: 855m removed Right thoracentesis 9/15: 4079mremoved  Antimicrobials:  Ceftriaxone 9/6 - 9/7 Metronidazole 9/6 - 9/8   Subjective: Patient seen examined bedside, resting comfortably.  Lying in bed.  Dyspnea and abdominal distention from ascites continues to be much improved.  No specific complaints this morning and awaiting SNF placement.  Denies headache, no dizziness, no chest pain, no palpitations, no fever/chills/night sweats, no nausea/vomiting/diarrhea, no focal weakness, no cough/congestion, no paresthesias.  No acute events overnight per nursing staff.  Objective: Vitals:   12/25/21 1325 12/25/21 1800 12/25/21 2209 12/26/21 0450  BP: 108/81  127/82 121/84  Pulse: 100  88 99  Resp: (!) '22  20 20  '$ Temp: (!) 97.4 F (36.3 C)  98.9 F (37.2 C) 98.4 F (36.9 C)  TempSrc: Oral  Oral Oral  SpO2: 98%  98% 96%  Weight:  60.6 kg    Height:        Intake/Output Summary (Last 24 hours) at 12/26/2021 1026 Last data filed at 12/25/2021 1800 Gross per 24 hour  Intake 560 ml  Output 75 ml  Net 485 ml   Filed Weights   12/15/21 2000 12/25/21 1000 12/25/21 1800  Weight: 69.5 kg 60.6 kg 60.6 kg    Examination:  Physical Exam: GEN: NAD, alert and oriented x 3, chronically ill/fatigued in appearance HEENT: NCAT, PERRL, EOMI, sclera clear, MMM PULM: Breath sounds diminished bilateral bases,  normal respiratory effort without accessory muscle use, no wheezing, on 3 L nasal cannula with SPO2 96% at rest CV: RRR w/o M/G/R, Port-A-Cath noted right chest GI: abd soft, NTND, NABS, no R/G/M MSK: no peripheral edema, muscle strength globally intact 5/5 bilateral upper/lower extremities NEURO: CN II-XII intact, no focal deficits, moves all extremities independently Integumentary: dry/intact, no rashes or wounds    Data Reviewed: I have personally reviewed following labs and imaging studies  CBC: Recent Labs  Lab 12/20/21 0304 12/21/21 0336 12/22/21 0340 12/23/21 0500 12/24/21 0334 12/25/21 0340  WBC 15.3* 16.9* 23.3* 21.8* 26.2* 37.0*  NEUTROABS 11.5* 13.3* 18.5*  --  20.6* 30.3*  HGB 11.5* 10.2* 10.4* 9.5* 12.2 12.0  HCT 35.9* 30.8* 31.4* 29.3* 37.0 36.7  MCV 89.5 89.0 89.5 89.6 87.1 88.6  PLT 97* 126* 162 189 223 27790 Basic Metabolic Panel: Recent Labs  Lab 12/21/21 0336 12/22/21 0340 12/23/21 0500 12/24/21 0334 12/25/21 0340  NA 130* 132* 132* 133* 136  K 4.2 4.5 3.7 3.8 4.3  CL 106 107 109 108 110  CO2 16* 17* 17* 19* 19*  GLUCOSE 97 117* 90 98 112*  BUN 45* 45* 36* 33* 31*  CREATININE 1.19* 1.20* 0.99 0.94 1.19*  CALCIUM 8.4* 8.7* 8.4* 8.5* 9.0   GFR: Estimated Creatinine Clearance: 35.3 mL/min (A) (by C-G formula based on SCr of 1.19 mg/dL (H)). Liver Function Tests: Recent Labs  Lab 12/20/21 0304 12/21/21 0336 12/22/21 0340 12/24/21 0334 12/25/21 0340  AST '18 17 21 21 19  '$ ALT '14 12 13 13 14  '$ ALKPHOS 178* 144* 197* 222* 244*  BILITOT 0.6 0.8 0.6 0.6 0.5  PROT  5.2* 5.4* 5.6* 4.8* 4.9*  ALBUMIN 2.0* 2.5* 2.3* 1.9* 1.9*   No results for input(s): "LIPASE", "AMYLASE" in the last 168 hours. No results for input(s): "AMMONIA" in the last 168 hours. Coagulation Profile: No results for input(s): "INR", "PROTIME" in the last 168 hours. Cardiac Enzymes: No results for input(s): "CKTOTAL", "CKMB", "CKMBINDEX", "TROPONINI" in the last 168 hours. BNP  (last 3 results) No results for input(s): "PROBNP" in the last 8760 hours. HbA1C: No results for input(s): "HGBA1C" in the last 72 hours. CBG: No results for input(s): "GLUCAP" in the last 168 hours.  Lipid Profile: No results for input(s): "CHOL", "HDL", "LDLCALC", "TRIG", "CHOLHDL", "LDLDIRECT" in the last 72 hours. Thyroid Function Tests: No results for input(s): "TSH", "T4TOTAL", "FREET4", "T3FREE", "THYROIDAB" in the last 72 hours. Anemia Panel: No results for input(s): "VITAMINB12", "FOLATE", "FERRITIN", "TIBC", "IRON", "RETICCTPCT" in the last 72 hours. Sepsis Labs: No results for input(s): "PROCALCITON", "LATICACIDVEN" in the last 168 hours.   Recent Results (from the past 240 hour(s))  Gastrointestinal Panel by PCR , Stool     Status: None   Collection Time: 12/17/21  6:52 AM   Specimen: Stool  Result Value Ref Range Status   Campylobacter species NOT DETECTED NOT DETECTED Final   Plesimonas shigelloides NOT DETECTED NOT DETECTED Final   Salmonella species NOT DETECTED NOT DETECTED Final   Yersinia enterocolitica NOT DETECTED NOT DETECTED Final   Vibrio species NOT DETECTED NOT DETECTED Final   Vibrio cholerae NOT DETECTED NOT DETECTED Final   Enteroaggregative E coli (EAEC) NOT DETECTED NOT DETECTED Final   Enteropathogenic E coli (EPEC) NOT DETECTED NOT DETECTED Final   Enterotoxigenic E coli (ETEC) NOT DETECTED NOT DETECTED Final   Shiga like toxin producing E coli (STEC) NOT DETECTED NOT DETECTED Final   Shigella/Enteroinvasive E coli (EIEC) NOT DETECTED NOT DETECTED Final   Cryptosporidium NOT DETECTED NOT DETECTED Final   Cyclospora cayetanensis NOT DETECTED NOT DETECTED Final   Entamoeba histolytica NOT DETECTED NOT DETECTED Final   Giardia lamblia NOT DETECTED NOT DETECTED Final   Adenovirus F40/41 NOT DETECTED NOT DETECTED Final   Astrovirus NOT DETECTED NOT DETECTED Final   Norovirus GI/GII NOT DETECTED NOT DETECTED Final   Rotavirus A NOT DETECTED NOT  DETECTED Final   Sapovirus (I, II, IV, and V) NOT DETECTED NOT DETECTED Final    Comment: Performed at [facility], [address] [place], [place] [address]  C Difficile Quick Screen w PCR reflex     Status: None   Collection Time: 12/17/21  6:52 AM   Specimen: Stool  Result Value Ref Range Status   C Diff antigen NEGATIVE NEGATIVE Final   C Diff toxin NEGATIVE NEGATIVE Final   C Diff interpretation No C. difficile detected.  Final    Comment: Performed at [facility], [place] [address] [place],  [address]         Radiology Studies: [facility] Chest Port 1 View  Result Date: 12/24/2021 CLINICAL DATA:  Status post right thoracentesis. EXAM: PORTABLE CHEST 1 VIEW COMPARISON:  Chest x-ray from yesterday. FINDINGS: Unchanged right chest wall port catheter. The heart size and mediastinal contours are within normal limits. Essentially resolved right pleural effusion status post thoracentesis. No pneumothorax. Recurrent small left pleural effusion. Improved right basilar and increased left basilar atelectasis. No acute osseous abnormality. IMPRESSION: 1. Essentially resolved right pleural effusion status post thoracentesis. No pneumothorax. 2. Recurrent small left pleural effusion. Electronically Signed   By: Titus Dubin  M.D.   On: 12/24/2021 13:50   US THORACENTESIS ASP PLEURAL SPACE W/IMG GUIDE  Result Date: 12/24/2021 INDICATION: Patient with history of serous adenocarcinoma of right ovary stage III. Request for therapeutic right thoracentesis. EXAM: ULTRASOUND GUIDED THERAPEUTIC RIGHT THORACENTESIS MEDICATIONS: 10 mL 1 % lidocaine COMPLICATIONS: None immediate. PROCEDURE: An ultrasound guided thoracentesis was thoroughly discussed with the patient and questions answered. The benefits, risks, alternatives and complications were also discussed. The patient understands and wishes to proceed with the procedure. Written consent was obtained. Ultrasound was performed  to localize and mark an adequate pocket of fluid in the right chest. The area was then prepped and draped in the normal sterile fashion. 1% Lidocaine was used for local anesthesia. Under ultrasound guidance a 6 Fr Safe-T-Centesis catheter was introduced. Thoracentesis was performed. The catheter was removed and a dressing applied. FINDINGS: A total of approximately 400 cc of clear, amber fluid was removed. IMPRESSION: Successful ultrasound guided right thoracentesis yielding 400 cc of pleural fluid. Read by: Narda Rutherford, AGNP-BC Electronically Signed   By: Miachel Roux M.D.   On: 12/24/2021 13:32        Scheduled Meds:  acyclovir ointment   Topical 5 X Daily   antiseptic oral rinse  15 mL Mouth Rinse Q4H   Chlorhexidine Gluconate Cloth  6 each Topical q morning   enoxaparin (LOVENOX) injection  40 mg Subcutaneous Q24H   feeding supplement  1 Container Oral TID BM   fiber supplement (BANATROL TF)  60 mL Oral BID   lipase/protease/amylase  36,000 Units Oral TID AC   megestrol  400 mg Oral BID   midodrine  10 mg Oral TID WC   mirtazapine  15 mg Oral QHS   multivitamin with minerals  1 tablet Oral Daily   pantoprazole  40 mg Oral BID   venlafaxine  37.5 mg Oral BID WC   Continuous Infusions:   LOS: 10 days    Time spent: 49 minutes spent on chart review, discussion with nursing staff, consultants, updating family and interview/physical exam; more than 50% of that time was spent in counseling and/or coordination of care.    Jaiel Saraceno J [place] DO [facility] Available via Epic secure chat 7am-7pm After these hours, please refer to coverage provider listed on [email or web address] 12/26/2021, 10:26 AM

## 2021-12-26 NOTE — Plan of Care (Signed)

## 2021-12-27 ENCOUNTER — Inpatient Hospital Stay (HOSPITAL_COMMUNITY): Payer: Medicare Other

## 2021-12-27 DIAGNOSIS — C561 Malignant neoplasm of right ovary: Secondary | ICD-10-CM | POA: Diagnosis not present

## 2021-12-27 DIAGNOSIS — D709 Neutropenia, unspecified: Secondary | ICD-10-CM | POA: Diagnosis not present

## 2021-12-27 DIAGNOSIS — N179 Acute kidney failure, unspecified: Secondary | ICD-10-CM | POA: Diagnosis not present

## 2021-12-27 DIAGNOSIS — R059 Cough, unspecified: Secondary | ICD-10-CM

## 2021-12-27 DIAGNOSIS — J9601 Acute respiratory failure with hypoxia: Secondary | ICD-10-CM | POA: Diagnosis not present

## 2021-12-27 DIAGNOSIS — R197 Diarrhea, unspecified: Secondary | ICD-10-CM | POA: Diagnosis not present

## 2021-12-27 DIAGNOSIS — D72829 Elevated white blood cell count, unspecified: Secondary | ICD-10-CM

## 2021-12-27 DIAGNOSIS — Z5189 Encounter for other specified aftercare: Secondary | ICD-10-CM | POA: Diagnosis not present

## 2021-12-27 DIAGNOSIS — E860 Dehydration: Secondary | ICD-10-CM | POA: Diagnosis not present

## 2021-12-27 DIAGNOSIS — D701 Agranulocytosis secondary to cancer chemotherapy: Secondary | ICD-10-CM | POA: Diagnosis not present

## 2021-12-27 DIAGNOSIS — I10 Essential (primary) hypertension: Secondary | ICD-10-CM | POA: Diagnosis not present

## 2021-12-27 LAB — CBC WITH DIFFERENTIAL/PLATELET
Abs Immature Granulocytes: 2.11 10*3/uL — ABNORMAL HIGH (ref 0.00–0.07)
Basophils Absolute: 0.1 10*3/uL (ref 0.0–0.1)
Basophils Relative: 0 %
Eosinophils Absolute: 0 10*3/uL (ref 0.0–0.5)
Eosinophils Relative: 0 %
HCT: 37.8 % (ref 36.0–46.0)
Hemoglobin: 12 g/dL (ref 12.0–15.0)
Immature Granulocytes: 6 %
Lymphocytes Relative: 7 %
Lymphs Abs: 2.1 10*3/uL (ref 0.7–4.0)
MCH: 28.2 pg (ref 26.0–34.0)
MCHC: 31.7 g/dL (ref 30.0–36.0)
MCV: 88.9 fL (ref 80.0–100.0)
Monocytes Absolute: 1.4 10*3/uL — ABNORMAL HIGH (ref 0.1–1.0)
Monocytes Relative: 4 %
Neutro Abs: 27.1 10*3/uL — ABNORMAL HIGH (ref 1.7–7.7)
Neutrophils Relative %: 83 %
Platelets: 366 10*3/uL (ref 150–400)
RBC: 4.25 MIL/uL (ref 3.87–5.11)
RDW: 15.9 % — ABNORMAL HIGH (ref 11.5–15.5)
WBC: 32.9 10*3/uL — ABNORMAL HIGH (ref 4.0–10.5)
nRBC: 0.1 % (ref 0.0–0.2)

## 2021-12-27 LAB — COMPREHENSIVE METABOLIC PANEL
ALT: 18 U/L (ref 0–44)
AST: 25 U/L (ref 15–41)
Albumin: 1.9 g/dL — ABNORMAL LOW (ref 3.5–5.0)
Alkaline Phosphatase: 276 U/L — ABNORMAL HIGH (ref 38–126)
Anion gap: 9 (ref 5–15)
BUN: 33 mg/dL — ABNORMAL HIGH (ref 8–23)
CO2: 19 mmol/L — ABNORMAL LOW (ref 22–32)
Calcium: 9.3 mg/dL (ref 8.9–10.3)
Chloride: 108 mmol/L (ref 98–111)
Creatinine, Ser: 1.12 mg/dL — ABNORMAL HIGH (ref 0.44–1.00)
GFR, Estimated: 52 mL/min — ABNORMAL LOW (ref >60)
Glucose, Bld: 88 mg/dL (ref 70–99)
Potassium: 4.3 mmol/L (ref 3.5–5.1)
Sodium: 136 mmol/L (ref 135–145)
Total Bilirubin: 0.4 mg/dL (ref 0.3–1.2)
Total Protein: 5.5 g/dL — ABNORMAL LOW (ref 6.5–8.1)

## 2021-12-27 LAB — URINE CULTURE

## 2021-12-27 MED ORDER — FUROSEMIDE 20 MG PO TABS
20.0000 mg | ORAL_TABLET | Freq: Two times a day (BID) | ORAL | Status: DC
  Administered 2021-12-27 – 2021-12-30 (×7): 20 mg via ORAL
  Filled 2021-12-27 (×7): qty 1

## 2021-12-27 NOTE — Plan of Care (Signed)

## 2021-12-27 NOTE — Progress Notes (Signed)
Maria Romero had a good weekend.  She sat in the chair a little bit more.  She said she ate a little more.  We will be interesting to see what the calorie count is.  She had a chest x-ray this morning.  The results not back yet.  She denies any obvious shortness of breath.  She did have a Foley catheter taken out.  I am happy about this.  Urine culture is pending.  Her labs show white count 32.9.  Hemoglobin 12.  Platelet count 366,000.  Her BUN is 33 creatinine 1.12.  Her albumin is 1.9.  Total protein 5.5.  She has had there is no obvious diarrhea.  Her stool is soft.  She has had a little bit of a cough it is nonproductive.  When I listen to her heart this morning, does sound a little bit erratic.  I will have to get an EKG to see if there is some kind of SVT or hopefully, not, atrial fibrillation.  Maybe, she had a little bit more physical therapy this week.  She is incredibly deconditioned.  Her vital signs are temperature 97.7.  Pulse 53.  Blood pressure 115/73.  Her lungs sound pretty clear bilaterally.  I do not hear any wheezes.  Cardiac exam regular rate that is somewhat irregular rhythm.  I hear no murmurs.  Abdomen is soft.  It is hard to say if there is any obvious fluid wave.  There is no guarding or rebound tenderness.  Extremities show some muscle atrophy in upper and lower extremities.  Neurological exam is nonfocal.  It would be really nice to try to get Maria Romero to some kind of rehab this week.  I do not worry about the possibility of ascites recurring.  It will be interesting to see what the chest x-ray shows.  We will also be interesting to see what the EKG shows.  Her white cell count is coming down a little bit.  Again I think the white cell elevation is more so from the Neulasta that she received.  I know that she has had incredible care from all the staff up on [address].  I do appreciate all of their compassion.  Lattie Haw, MD  [place] 14:14

## 2021-12-27 NOTE — Progress Notes (Signed)
Mobility Specialist - Progress Note   12/27/21 1300  Mobility  Activity Transferred to/from [facility]  Level of Assistance Moderate assist, patient does 50-74%  Assistive Device BSC  Activity Response Tolerated well  $Mobility charge 1 Mobility   Pt was a +2 and assisted NT in the transfer.  Ferd Hibbs Mobility Specialist

## 2021-12-27 NOTE — Progress Notes (Signed)
Physical Therapy Treatment Patient Details Name: Maria Romero MRN: [id number] DOB: 07-07-1950 Today's Date: 12/27/2021   History of Present Illness Maria Romero is a 71 y.o. female admitted 9/6 who presents with increasing weakness and persistent diarrhea. could not tolerate thoracentesis Fri 9/8 due to hypotension.  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  Patient stood with " bear hug"  from bed, able to take small steps x 4 to recliner.  Much difficulty with attempts to stand from recliner with 2 attempts.  Patient reports SOB with exertion. SPO2 on RA after activity was 95%. HR 67.  No complaints of dizziness. Continue PT for progressive mobility.  Recommendations for follow up therapy are one component of a multi-disciplinary discharge planning process, led by the attending physician.  Recommendations may be updated based on patient status, additional functional criteria and insurance authorization.  Follow Up Recommendations  Skilled nursing-short term rehab (<3 hours/day) Can patient physically be transported by private vehicle: No   Assistance Recommended at Discharge Frequent or constant Supervision/Assistance  Patient can return home with the following A little help with bathing/dressing/bathroom;Assistance with cooking/housework;Direct supervision/assist for medications management;Direct supervision/assist for financial management;Assist for transportation;Help with stairs or ramp for entrance;A lot of help with walking and/or transfers   Equipment Recommendations  Other (comment) (TBD next venue)    Recommendations for Other Services       Precautions / Restrictions Precautions Precautions: Fall Precaution Comments: profoundly weak     Mobility  Bed Mobility    Bed Mobility: Supine to Sit     Supine to sit: Min guard     General bed mobility comments: patient able to move to sitting on bed edge with no assistance from supine    Transfers Overall transfer level: Needs assistance   Transfers: Sit to/from Stand, Bed to chair/wheelchair/BSC Sit to Stand: Mod assist, Max assist           General transfer comment: max assist to power up to stand, patient  able to step to recliner x 4 steps. Attempted to stand from recliner x 2 , pulling up on bed rail  while facing bed  , but unable to power up .    Ambulation/Gait                   Stairs             Wheelchair Mobility    Modified Rankin (Stroke Patients Only)       Balance Overall balance assessment: Needs assistance Sitting-balance support: Single extremity supported, Feet supported Sitting balance-Leahy Scale: Fair     Standing balance support: Bilateral upper extremity supported, During functional activity Standing balance-Leahy Scale: Poor                              Cognition Arousal/Alertness: Awake/alert Behavior During Therapy: Anxious, WFL for tasks assessed/performed                                            Exercises General Exercises - Lower Extremity Long Arc Quad: AROM, 10 reps, Both, Seated Hip Flexion/Marching: AROM, Both, 10 reps, Seated  Other Exercises Other Exercises: seated  hip rotations  both legs x 10    General Comments        Pertinent Vitals/Pain Pain Assessment Pain Assessment: No/denies pain    Home Living                          Prior Function            PT Goals (current goals can now be found in the care plan section) Progress towards PT goals: Progressing toward goals    Frequency    Min 2X/week      PT Plan Current plan remains appropriate;Frequency needs to be updated    Co-evaluation              AM-PAC PT "6 Clicks" Mobility   Outcome  Measure  Help needed turning from your back to your side while in a flat bed without using bedrails?: A Little Help needed moving from lying on your back to sitting on the side of a flat bed without using bedrails?: A Little Help needed moving to and from a bed to a chair (including a wheelchair)?: A Lot Help needed standing up from a chair using your arms (e.g., wheelchair or bedside chair)?: Total Help needed to walk in hospital room?: Total Help needed climbing 3-5 steps with a railing? : Total 6 Click Score: 11    End of Session Equipment Utilized During Treatment: Gait belt Activity Tolerance: Patient limited by fatigue Patient left: in chair;with call bell/phone within reach;with nursing/sitter in room;with family/visitor present Nurse Communication: Mobility status PT Visit Diagnosis: Muscle weakness (generalized) (M62.81);Difficulty in walking, not elsewhere classified (R26.2)     Time: 0900-0910 PT Time Calculation (min) (ACUTE ONLY): 10 min  Charges:  $Therapeutic Activity: 8-22 mins                     Tresa Endo PT Acute Rehabilitation Services Office [phone] Weekend [phone]    Claretha Cooper 12/27/2021, 9:11 AM

## 2021-12-27 NOTE — Progress Notes (Signed)
PROGRESS NOTE    Maria Romero  OXB:[id number] DOB: 11/03/1950 DOA: 12/15/2021 PCP: Hali Marry, MD    Brief Narrative:   Maria Romero is a 71 y.o. female with past medical history significant for serous adenocarcinoma right ovary stage IIIc on neoadjuvant chemotherapy requiring recurrent paracentesis, essential hypertension who presented to [facility] ED on 9/6 by discretion of her oncologist for progressive shortness of breath, abdominal distention, diarrhea and generalized weakness.  Patient recently started on chemotherapy with carboplatinum/Taxol on 8/29 and subsequent 3 days later she began to have persistent diarrhea with decreased oral intake.  Patient denied any nausea/vomiting and no fever/chills.  She was seen in the oncology office on 9/5 and given IV fluids as well as Neulasta for white cell count of 800.  She returned to the oncology office due to continued symptoms and generalized weakness.  She was then advised to present to the ED for further evaluation.  In the ED, patient was afebrile, tachycardic to 101 bpm, tachypneic.  WBC of 700.  Hemoglobin 11.3, sodium 126, potassium 5.6, creatinine 1.65.  Albumin 1.9.  Chest x-ray showing increasing left pleural effusion with vascular congestion.  Patient was given IV albumin, Lokelma and 1 L normal saline fluid and started on continuous fluid.  Patient was transferred to [facility] under the hospitalist service.  Assessment & Plan:   Acute respiratory failure with hypoxia Pleural effusion Chest x-ray with moderate bilateral pleural effusions.  Continues require 3 L nasal cannula.  Underwent IR ultrasound-guided left thoracentesis on 9/14 with 800 mL fluid removed and left thoracentesis on 9/15 with 474m removed --Repeat chest x-ray this morning notable for increase in left pleural effusion, now moderate --Start Lasix 20 mg p.o. twice daily --May need repeat thoracentesis over the next few  days --Continue supplemental oxygen, maintain SPO2 greater than 92%  Acute renal failure: Resolved Creatinine 1.65 on admission, which has progressively been increasing since a month ago where creatinine was 0.93. --Cr 1.65>>1.20>0.99>0.94>1.19>1.12 -- BMP daily  Hyperkalemia: Resolved Treated with Lokelma on admission, now resolved.  Potassium 4.3 today. --BMP daily  Ovarian cancer, right stage IIIc Follows with medical oncology outpatient, Dr. EMarin Olp  Completed first session of neoadjuvant chemotherapy on 12/07/2021.  Overall appears very deconditioned and unclear how well she will tolerate continued chemotherapy given her current functional status.  Overall very poor prognosis  Malignant ascites Underwent paracentesis on 9/7 with 5 L removed.  Abdomen continues to be more tense, underwent repeat paracentesis on 9/13 with 4 L removed. --Strict I's and O's  Orthostatic hypotension --Orthostatic vital signs daily --Midodrine '10mg'$  p.o. 3 times daily  Acute urinary retention: Resolved Initially required Foley catheter, voiding trial performed on 9/16 with success. -- Continue to monitor urinary output  Neutropenia: Resolved WBC count 0.5 on admission with ANC 0.0.  Received Neulasta in oncology clinic. --WBC 0.5>>16.98>23.3>21.8>26.2>37.0>32.9 --CBC daily  Diarrhea: Likely secondary to chemotherapy.  Slowly improving.  C. difficile and GI PCR panel negative. --Lomotil 4 times daily as needed --Supportive care  Anemia of chronic medical disease Transfused 1 unit PRBC on 9/14.  Hemoglobin up to 12.0.  Hyperlipidemia: Lipitor 40 mg p.o. daily  GERD --Protonix 40 mg p.o. twice daily  Depression: --Started on venlafaxine 37.5 mg p.o. twice daily  Weakness/deconditioning/debility: --PT now recommending SNF placement --TOC for evaluation  Moderate protein calorie malnutrition Body mass index is 23.67 kg/m. Nutrition Status: Nutrition Problem: Moderate  Malnutrition Etiology: chronic illness, cancer and cancer related treatments Signs/Symptoms: mild  fat depletion, mild muscle depletion Interventions: Boost Breeze, MVI -- Dietitian following, encourage increased oral intake, supplementation, currently undergoing calorie count     DVT prophylaxis: Place TED hose Start: 12/21/21 0921 enoxaparin (LOVENOX) injection 40 mg Start: 12/18/21 1000 SCDs Start: 12/15/21 2000    Code Status: Full Code Family Communication: No family present at bedside this morning  Disposition Plan:  Level of care: Med-Surg Status is: Inpatient Remains inpatient appropriate because: Pending SNF placement    Consultants:  Medical oncology, Dr. Marin Olp Interventional radiology  Procedures:  Paracentesis 9/7, 5 L removed Paracentesis 9/13, 4 L removed Left Thoracentesis 9/14: 866m removed Right thoracentesis 9/15: 4062mremoved  Antimicrobials:  Ceftriaxone 9/6 - 9/7 Metronidazole 9/6 - 9/8   Subjective: Patient seen examined bedside, resting comfortably.  Lying in bed.  Continues with mild cough and shortness of breath but stable.  Repeat chest x-ray this morning shows slightly worsening left pleural effusion now listed as moderate.  Discussed starting Lasix to assist with mobilization of fluid but may need repeat thoracentesis at some point during this week.  Seen by medical oncology this morning.  Awaiting SNF placement.  No other questions or concerns at this time.  Denies headache, no dizziness, no chest pain, no palpitations, no fever/chills/night sweats, no nausea/vomiting/diarrhea, no focal weakness, no cough/congestion, no paresthesias.  No acute events overnight per nursing staff.  Objective: Vitals:   12/26/21 0450 12/26/21 1233 12/26/21 2139 12/27/21 0610  BP: 121/84 122/76 114/84 115/73  Pulse: 99 (!) 50 89 (!) 53  Resp: '20 18 17 17  '$ Temp: 98.4 F (36.9 C) 98.2 F (36.8 C) 97.8 F (36.6 C) 97.7 F (36.5 C)  TempSrc: Oral Oral Oral  Oral  SpO2: 96% 97% 97% 98%  Weight:      Height:        Intake/Output Summary (Last 24 hours) at 12/27/2021 1051 Last data filed at 12/27/2021 0800 Gross per 24 hour  Intake 340 ml  Output 50 ml  Net 290 ml   Filed Weights   12/15/21 2000 12/25/21 1000 12/25/21 1800  Weight: 69.5 kg 60.6 kg 60.6 kg    Examination:  Physical Exam: GEN: NAD, alert and oriented x 3, chronically ill/fatigued in appearance HEENT: NCAT, PERRL, EOMI, sclera clear, MMM PULM: Breath sounds diminished bilateral bases L>R, normal respiratory effort without accessory muscle use, no wheezing, on 2 L nasal cannula with SPO2 98% at rest CV: RRR w/o M/G/R, Port-A-Cath noted right chest GI: abd soft, NTND, NABS, no R/G/M MSK: no peripheral edema, muscle strength globally intact 5/5 bilateral upper/lower extremities NEURO: CN II-XII intact, no focal deficits, moves all extremities independently Integumentary: dry/intact, no rashes or wounds    Data Reviewed: I have personally reviewed following labs and imaging studies  CBC: Recent Labs  Lab 12/21/21 0336 12/22/21 0340 12/23/21 0500 12/24/21 0334 12/25/21 0340 12/27/21 0455  WBC 16.9* 23.3* 21.8* 26.2* 37.0* 32.9*  NEUTROABS 13.3* 18.5*  --  20.6* 30.3* 27.1*  HGB 10.2* 10.4* 9.5* 12.2 12.0 12.0  HCT 30.8* 31.4* 29.3* 37.0 36.7 37.8  MCV 89.0 89.5 89.6 87.1 88.6 88.9  PLT 126* 162 189 223 278 [id number] Basic Metabolic Panel: Recent Labs  Lab 12/22/21 0340 12/23/21 0500 12/24/21 0334 12/25/21 0340 12/27/21 0455  NA 132* 132* 133* 136 136  K 4.5 3.7 3.8 4.3 4.3  CL 107 109 108 110 108  CO2 17* 17* 19* 19* 19*  GLUCOSE 117* 90 98 112* 88  BUN 45* 36* 33*  31* 33*  CREATININE 1.20* 0.99 0.94 1.19* 1.12*  CALCIUM 8.7* 8.4* 8.5* 9.0 9.3   GFR: Estimated Creatinine Clearance: 37.6 mL/min (A) (by C-G formula based on SCr of 1.12 mg/dL (H)). Liver Function Tests: Recent Labs  Lab 12/21/21 0336 12/22/21 0340 12/24/21 0334 12/25/21 0340  12/27/21 0455  AST '17 21 21 19 25  '$ ALT '12 13 13 14 18  '$ ALKPHOS 144* 197* 222* 244* 276*  BILITOT 0.8 0.6 0.6 0.5 0.4  PROT 5.4* 5.6* 4.8* 4.9* 5.5*  ALBUMIN 2.5* 2.3* 1.9* 1.9* 1.9*   No results for input(s): "LIPASE", "AMYLASE" in the last 168 hours. No results for input(s): "AMMONIA" in the last 168 hours. Coagulation Profile: No results for input(s): "INR", "PROTIME" in the last 168 hours. Cardiac Enzymes: No results for input(s): "CKTOTAL", "CKMB", "CKMBINDEX", "TROPONINI" in the last 168 hours. BNP (last 3 results) No results for input(s): "PROBNP" in the last 8760 hours. HbA1C: No results for input(s): "HGBA1C" in the last 72 hours. CBG: No results for input(s): "GLUCAP" in the last 168 hours.  Lipid Profile: No results for input(s): "CHOL", "HDL", "LDLCALC", "TRIG", "CHOLHDL", "LDLDIRECT" in the last 72 hours. Thyroid Function Tests: No results for input(s): "TSH", "T4TOTAL", "FREET4", "T3FREE", "THYROIDAB" in the last 72 hours. Anemia Panel: No results for input(s): "VITAMINB12", "FOLATE", "FERRITIN", "TIBC", "IRON", "RETICCTPCT" in the last 72 hours. Sepsis Labs: No results for input(s): "PROCALCITON", "LATICACIDVEN" in the last 168 hours.   Recent Results (from the past 240 hour(s))  Remove urinary catheter to obtain Straight Cath urine culture     Status: Abnormal   Collection Time: 12/25/21  6:00 PM   Specimen: In/Out Cath Urine  Result Value Ref Range Status   Specimen Description   Final    IN/OUT CATH URINE Performed at [facility] [address], [place], [place] [address]    Special Requests   Final    Immunocompromised Performed at [facility], [place] [address] [place], [place] [address]    Culture MULTIPLE SPECIES PRESENT, SUGGEST RECOLLECTION (A)  Final   Report Status 12/27/2021 FINAL  Final         Radiology Studies: DG CHEST PORT 1 VIEW  Result Date: 12/27/2021 CLINICAL DATA:  Shortness of breath,  hypertension EXAM: PORTABLE CHEST 1 VIEW COMPARISON:  12/24/2021 FINDINGS: Moderate left pleural effusion with left basilar atelectasis. Trace right pleural effusion. No pneumothorax. Stable cardiomediastinal silhouette. Right-sided Port-A-Cath in satisfactory position. No aggressive osseous lesion. IMPRESSION: 1. Moderate left pleural effusion with left basilar atelectasis. 2. Trace right pleural effusion. Electronically Signed   By: Kathreen Devoid M.D.   On: 12/27/2021 08:13        Scheduled Meds:  acyclovir ointment   Topical 5 X Daily   antiseptic oral rinse  15 mL Mouth Rinse Q4H   Chlorhexidine Gluconate Cloth  6 each Topical q morning   enoxaparin (LOVENOX) injection  40 mg Subcutaneous Q24H   feeding supplement  1 Container Oral TID BM   fiber supplement (BANATROL TF)  60 mL Oral BID   furosemide  20 mg Oral BID   lipase/protease/amylase  36,000 Units Oral TID AC   megestrol  400 mg Oral BID   midodrine  10 mg Oral TID WC   mirtazapine  15 mg Oral QHS   multivitamin with minerals  1 tablet Oral Daily   pantoprazole  40 mg Oral BID   venlafaxine  37.5 mg Oral BID WC   Continuous Infusions:  LOS: 11 days    Time spent: 49 minutes spent on chart review, discussion with nursing staff, consultants, updating family and interview/physical exam; more than 50% of that time was spent in counseling and/or coordination of care.    Tiny Chaudhary J [place] DO [facility] Available via Epic secure chat 7am-7pm After these hours, please refer to coverage provider listed on [email or web address] 12/27/2021, 10:51 AM

## 2021-12-27 NOTE — TOC Progression Note (Signed)
Transition of Care [facility]) - Progression Note    Patient Details  Name: Maria Romero MRN: [id number] Date of Birth: 1951-02-07  Transition of Care [facility]) CM/SW [place], RN Phone Number: 12/27/2021, 1:16 PM  Clinical Narrative:   Presented SNF bed offers to patietn at bedside, left Medicare.gov SNF bed off er (highlighted) list in patient's room. Patient will discuss with her daughter and notify this RNCM once a decision is made. This RNCM explained the process of SF placement to include insurance auth.   TOC will continue to follow.    Expected Discharge Plan: [place] Barriers to Discharge: Continued Medical Work up  Expected Discharge Plan and Services Expected Discharge Plan: [place] In-house Referral: NA Discharge Planning Services: CM Consult Post Acute Care Choice: [place] arrangements for the past 2 months: Single Family Home                 DME Arranged: N/A DME Agency: NA       HH Arranged: PT HH Agency: [place] Date [place]: 12/22/21 Time [place]: 0802 Representative spoke with at [place]: [place] Determinants of Health ([place]) Interventions    Readmission Risk Interventions     No data to display

## 2021-12-27 NOTE — Care Management Important Message (Signed)
Important Message  Patient Details IM Letter given to the Patient. Name: Maria Romero MRN: [id number] Date of Birth: Aug 28, 1950   Medicare Important Message Given:  Yes     Kerin Salen 12/27/2021, 10:48 AM

## 2021-12-28 ENCOUNTER — Inpatient Hospital Stay: Payer: Medicare Other

## 2021-12-28 ENCOUNTER — Inpatient Hospital Stay (HOSPITAL_COMMUNITY): Payer: Medicare Other

## 2021-12-28 ENCOUNTER — Inpatient Hospital Stay: Payer: Medicare Other | Admitting: Hematology & Oncology

## 2021-12-28 DIAGNOSIS — J9601 Acute respiratory failure with hypoxia: Secondary | ICD-10-CM | POA: Diagnosis not present

## 2021-12-28 DIAGNOSIS — J90 Pleural effusion, not elsewhere classified: Secondary | ICD-10-CM | POA: Diagnosis not present

## 2021-12-28 DIAGNOSIS — R188 Other ascites: Secondary | ICD-10-CM | POA: Diagnosis not present

## 2021-12-28 DIAGNOSIS — R197 Diarrhea, unspecified: Secondary | ICD-10-CM | POA: Diagnosis not present

## 2021-12-28 DIAGNOSIS — I498 Other specified cardiac arrhythmias: Secondary | ICD-10-CM

## 2021-12-28 DIAGNOSIS — N179 Acute kidney failure, unspecified: Secondary | ICD-10-CM | POA: Diagnosis not present

## 2021-12-28 DIAGNOSIS — D72829 Elevated white blood cell count, unspecified: Secondary | ICD-10-CM | POA: Diagnosis not present

## 2021-12-28 DIAGNOSIS — D701 Agranulocytosis secondary to cancer chemotherapy: Secondary | ICD-10-CM | POA: Diagnosis not present

## 2021-12-28 LAB — CBC WITH DIFFERENTIAL/PLATELET
Abs Immature Granulocytes: 2.37 10*3/uL — ABNORMAL HIGH (ref 0.00–0.07)
Basophils Absolute: 0.1 10*3/uL (ref 0.0–0.1)
Basophils Relative: 0 %
Eosinophils Absolute: 0 10*3/uL (ref 0.0–0.5)
Eosinophils Relative: 0 %
HCT: 33.8 % — ABNORMAL LOW (ref 36.0–46.0)
Hemoglobin: 10.6 g/dL — ABNORMAL LOW (ref 12.0–15.0)
Immature Granulocytes: 7 %
Lymphocytes Relative: 5 %
Lymphs Abs: 1.9 10*3/uL (ref 0.7–4.0)
MCH: 28 pg (ref 26.0–34.0)
MCHC: 31.4 g/dL (ref 30.0–36.0)
MCV: 89.4 fL (ref 80.0–100.0)
Monocytes Absolute: 1.4 10*3/uL — ABNORMAL HIGH (ref 0.1–1.0)
Monocytes Relative: 4 %
Neutro Abs: 30.1 10*3/uL — ABNORMAL HIGH (ref 1.7–7.7)
Neutrophils Relative %: 84 %
Platelets: 379 10*3/uL (ref 150–400)
RBC: 3.78 MIL/uL — ABNORMAL LOW (ref 3.87–5.11)
RDW: 15.9 % — ABNORMAL HIGH (ref 11.5–15.5)
WBC: 35.8 10*3/uL — ABNORMAL HIGH (ref 4.0–10.5)
nRBC: 0.1 % (ref 0.0–0.2)

## 2021-12-28 LAB — COMPREHENSIVE METABOLIC PANEL
ALT: 19 U/L (ref 0–44)
AST: 24 U/L (ref 15–41)
Albumin: 1.7 g/dL — ABNORMAL LOW (ref 3.5–5.0)
Alkaline Phosphatase: 257 U/L — ABNORMAL HIGH (ref 38–126)
Anion gap: 7 (ref 5–15)
BUN: 31 mg/dL — ABNORMAL HIGH (ref 8–23)
CO2: 21 mmol/L — ABNORMAL LOW (ref 22–32)
Calcium: 8.7 mg/dL — ABNORMAL LOW (ref 8.9–10.3)
Chloride: 109 mmol/L (ref 98–111)
Creatinine, Ser: 1.13 mg/dL — ABNORMAL HIGH (ref 0.44–1.00)
GFR, Estimated: 52 mL/min — ABNORMAL LOW (ref >60)
Glucose, Bld: 113 mg/dL — ABNORMAL HIGH (ref 70–99)
Potassium: 3.6 mmol/L (ref 3.5–5.1)
Sodium: 137 mmol/L (ref 135–145)
Total Bilirubin: 0.4 mg/dL (ref 0.3–1.2)
Total Protein: 4.8 g/dL — ABNORMAL LOW (ref 6.5–8.1)

## 2021-12-28 LAB — PREALBUMIN: Prealbumin: 6 mg/dL — ABNORMAL LOW (ref 18–38)

## 2021-12-28 LAB — CYTOLOGY - NON PAP

## 2021-12-28 MED ORDER — LIDOCAINE HCL 1 % IJ SOLN
INTRAMUSCULAR | Status: AC
  Filled 2021-12-28: qty 20

## 2021-12-28 MED ORDER — ENSURE ENLIVE PO LIQD
237.0000 mL | ORAL | Status: DC
  Administered 2021-12-28 – 2022-01-01 (×2): 237 mL via ORAL

## 2021-12-28 NOTE — Progress Notes (Signed)
PROGRESS NOTE    Maria Romero  NWG:[id number] DOB: 04/12/1950 DOA: 12/15/2021 PCP: Hali Marry, MD    Brief Narrative:   Maria Romero is a 71 y.o. female with past medical history significant for serous adenocarcinoma right ovary stage IIIc on neoadjuvant chemotherapy requiring recurrent paracentesis, essential hypertension who presented to [facility] ED on 9/6 by discretion of her oncologist for progressive shortness of breath, abdominal distention, diarrhea and generalized weakness.  Patient recently started on chemotherapy with carboplatinum/Taxol on 8/29 and subsequent 3 days later she began to have persistent diarrhea with decreased oral intake.  Patient denied any nausea/vomiting and no fever/chills.  She was seen in the oncology office on 9/5 and given IV fluids as well as Neulasta for white cell count of 800.  She returned to the oncology office due to continued symptoms and generalized weakness.  She was then advised to present to the ED for further evaluation.  In the ED, patient was afebrile, tachycardic to 101 bpm, tachypneic.  WBC of 700.  Hemoglobin 11.3, sodium 126, potassium 5.6, creatinine 1.65.  Albumin 1.9.  Chest x-ray showing increasing left pleural effusion with vascular congestion.  Patient was given IV albumin, Lokelma and 1 L normal saline fluid and started on continuous fluid.  Patient was transferred to [facility] under the hospitalist service.  Assessment & Plan:   Acute respiratory failure with hypoxia Pleural effusion Chest x-ray with moderate bilateral pleural effusions.  Continues require 3 L nasal cannula.  Underwent IR ultrasound-guided left thoracentesis on 9/14 with 800 mL fluid removed and left thoracentesis on 9/15 with 474m removed --Started on Lasix 20 mg p.o. twice daily --May need repeat thoracentesis over the next few days if develops recurrent shortness of breath --Continue supplemental oxygen, maintain SPO2 greater  than 92%  Acute renal failure: Resolved Creatinine 1.65 on admission, which has progressively been increasing since a month ago where creatinine was 0.93. --Cr 1.65>>1.20>0.99>0.94>1.19>1.12>1.13 -- BMP daily  Hyperkalemia: Resolved Treated with Lokelma on admission, now resolved.  Potassium 4.3 today. --BMP daily  Ovarian cancer, right stage IIIc Follows with medical oncology outpatient, Dr. EMarin Olp  Completed first session of neoadjuvant chemotherapy on 12/07/2021.  Overall appears very deconditioned and unclear how well she will tolerate continued chemotherapy given her current functional status.  Overall very poor prognosis  Malignant ascites Underwent paracentesis on 9/7 with 5 L removed.  Abdomen continues to be more tense, underwent repeat paracentesis on 9/13 with 4 L removed. --Oncology ordered repeat paracentesis today --Strict I's and O's  Viral lip lesion: Improving -- Acyclovir ointment 5 times daily upper lip  Orthostatic hypotension --Orthostatic vital signs daily --Midodrine '10mg'$  p.o. 3 times daily  Acute urinary retention: Resolved Initially required Foley catheter, voiding trial performed on 9/16 with success. -- Continue to monitor urinary output  Neutropenia: Resolved WBC count 0.5 on admission with ANC 0.0.  Received Neulasta in oncology clinic. --WBC 0.5>>16.98>23.3>21.8>26.2>37.0>32.9>35.8 --CBC daily  Diarrhea: Likely secondary to chemotherapy.  Slowly improving.  C. difficile and GI PCR panel negative. --Lomotil 4 times daily as needed --Supportive care  Anemia of chronic medical disease Transfused 1 unit PRBC on 9/14.  Hemoglobin up to 12.0.  Hyperlipidemia: Lipitor 40 mg p.o. daily  GERD --Protonix 40 mg p.o. twice daily  Depression: --Started on venlafaxine 37.5 mg p.o. twice daily  Weakness/deconditioning/debility: --PT now recommending SNF placement --TOC for evaluation  Moderate protein calorie malnutrition Body mass index is  23.67 kg/m. Nutrition Status: Nutrition Problem: Moderate  Malnutrition Etiology: chronic illness, cancer and cancer related treatments Signs/Symptoms: mild fat depletion, mild muscle depletion Interventions: Boost Breeze, MVI -- Dietitian following, encourage increased oral intake, supplementation, currently undergoing calorie count     DVT prophylaxis: Place TED hose Start: 12/21/21 0921 enoxaparin (LOVENOX) injection 40 mg Start: 12/18/21 1000 SCDs Start: 12/15/21 2000    Code Status: Full Code Family Communication: No family present at bedside this morning  Disposition Plan:  Level of care: Med-Surg Status is: Inpatient Remains inpatient appropriate because: Pending SNF placement    Consultants:  Medical oncology, Dr. Marin Olp Interventional radiology  Procedures:  Paracentesis 9/7, 5 L removed Paracentesis 9/13, 4 L removed Left Thoracentesis 9/14: 826m removed Right thoracentesis 9/15: 4037mremoved  Antimicrobials:  Ceftriaxone 9/6 - 9/7 Metronidazole 9/6 - 9/8   Subjective: Patient seen examined bedside, resting comfortably.  Lying in bed.  Dyspnea remains about the same, stable.  Started on Lasix 20 mg p.o. twice daily yesterday.  Reports good urine output with this.  Her nasal cannula has been off for some time overnight, assess pulse ox this morning 94% at rest off of oxygen.    Awaiting SNF placement.  No other questions or concerns at this time.  Denies headache, no dizziness, no chest pain, no palpitations, no fever/chills/night sweats, no nausea/vomiting/diarrhea, no focal weakness, no cough/congestion, no paresthesias.  No acute events overnight per nursing staff.  Objective: Vitals:   12/27/21 0610 12/27/21 1253 12/27/21 2301 12/28/21 0428  BP: 115/73 114/74 121/74 110/66  Pulse: (!) 53 (!) 52 (!) 49 (!) 53  Resp: '17 16 20 20  '$ Temp: 97.7 F (36.5 C) 97.7 F (36.5 C) 98.1 F (36.7 C) 98 F (36.7 C)  TempSrc: Oral Oral Oral Oral  SpO2: 98% 99% 98%  98%  Weight:      Height:        Intake/Output Summary (Last 24 hours) at 12/28/2021 1104 Last data filed at 12/28/2021 0742 Gross per 24 hour  Intake 240 ml  Output 900 ml  Net -660 ml   Filed Weights   12/15/21 2000 12/25/21 1000 12/25/21 1800  Weight: 69.5 kg 60.6 kg 60.6 kg    Examination:  Physical Exam: GEN: NAD, alert and oriented x 3, chronically ill/fatigued in appearance HEENT: NCAT, PERRL, EOMI, sclera clear, MMM PULM: Breath sounds diminished bilateral bases L>R, normal respiratory effort without accessory muscle use, no wheezing, on 2 L nasal cannula with SPO2 98% at rest CV: RRR w/o M/G/R, Port-A-Cath noted right chest GI: abd soft, NTND, NABS, no R/G/M MSK: no peripheral edema, muscle strength globally intact 5/5 bilateral upper/lower extremities NEURO: CN II-XII intact, no focal deficits, moves all extremities independently Integumentary: Upper lip lesion improving, otherwise no other concerning rashes/lesions/wounds     Data Reviewed: I have personally reviewed following labs and imaging studies  CBC: Recent Labs  Lab 12/22/21 0340 12/23/21 0500 12/24/21 0334 12/25/21 0340 12/27/21 0455 12/28/21 0410  WBC 23.3* 21.8* 26.2* 37.0* 32.9* 35.8*  NEUTROABS 18.5*  --  20.6* 30.3* 27.1* 30.1*  HGB 10.4* 9.5* 12.2 12.0 12.0 10.6*  HCT 31.4* 29.3* 37.0 36.7 37.8 33.8*  MCV 89.5 89.6 87.1 88.6 88.9 89.4  PLT 162 189 223 278 366 [id number] Basic Metabolic Panel: Recent Labs  Lab 12/23/21 0500 12/24/21 0334 12/25/21 0340 12/27/21 0455 12/28/21 0410  NA 132* 133* 136 136 137  K 3.7 3.8 4.3 4.3 3.6  CL 109 108 110 108 109  CO2 17* 19* 19* 19* 21*  GLUCOSE 90 98 112* 88 113*  BUN 36* 33* 31* 33* 31*  CREATININE 0.99 0.94 1.19* 1.12* 1.13*  CALCIUM 8.4* 8.5* 9.0 9.3 8.7*   GFR: Estimated Creatinine Clearance: 37.2 mL/min (A) (by C-G formula based on SCr of 1.13 mg/dL (H)). Liver Function Tests: Recent Labs  Lab 12/22/21 0340 12/24/21 0334  12/25/21 0340 12/27/21 0455 12/28/21 0410  AST '21 21 19 25 24  '$ ALT '13 13 14 18 19  '$ ALKPHOS 197* 222* 244* 276* 257*  BILITOT 0.6 0.6 0.5 0.4 0.4  PROT 5.6* 4.8* 4.9* 5.5* 4.8*  ALBUMIN 2.3* 1.9* 1.9* 1.9* 1.7*   No results for input(s): "LIPASE", "AMYLASE" in the last 168 hours. No results for input(s): "AMMONIA" in the last 168 hours. Coagulation Profile: No results for input(s): "INR", "PROTIME" in the last 168 hours. Cardiac Enzymes: No results for input(s): "CKTOTAL", "CKMB", "CKMBINDEX", "TROPONINI" in the last 168 hours. BNP (last 3 results) No results for input(s): "PROBNP" in the last 8760 hours. HbA1C: No results for input(s): "HGBA1C" in the last 72 hours. CBG: No results for input(s): "GLUCAP" in the last 168 hours.  Lipid Profile: No results for input(s): "CHOL", "HDL", "LDLCALC", "TRIG", "CHOLHDL", "LDLDIRECT" in the last 72 hours. Thyroid Function Tests: No results for input(s): "TSH", "T4TOTAL", "FREET4", "T3FREE", "THYROIDAB" in the last 72 hours. Anemia Panel: No results for input(s): "VITAMINB12", "FOLATE", "FERRITIN", "TIBC", "IRON", "RETICCTPCT" in the last 72 hours. Sepsis Labs: No results for input(s): "PROCALCITON", "LATICACIDVEN" in the last 168 hours.   Recent Results (from the past 240 hour(s))  Remove urinary catheter to obtain Straight Cath urine culture     Status: Abnormal   Collection Time: 12/25/21  6:00 PM   Specimen: In/Out Cath Urine  Result Value Ref Range Status   Specimen Description   Final    IN/OUT CATH URINE Performed at [facility] [address], [place], [place] [address]    Special Requests   Final    Immunocompromised Performed at [facility], [place] [address] [place], [place] [address]    Culture MULTIPLE SPECIES PRESENT, SUGGEST RECOLLECTION (A)  Final   Report Status 12/27/2021 FINAL  Final         Radiology Studies: DG CHEST PORT 1 VIEW  Result Date: 12/27/2021 CLINICAL  DATA:  Shortness of breath, hypertension EXAM: PORTABLE CHEST 1 VIEW COMPARISON:  12/24/2021 FINDINGS: Moderate left pleural effusion with left basilar atelectasis. Trace right pleural effusion. No pneumothorax. Stable cardiomediastinal silhouette. Right-sided Port-A-Cath in satisfactory position. No aggressive osseous lesion. IMPRESSION: 1. Moderate left pleural effusion with left basilar atelectasis. 2. Trace right pleural effusion. Electronically Signed   By: Kathreen Devoid M.D.   On: 12/27/2021 08:13        Scheduled Meds:  acyclovir ointment   Topical 5 X Daily   antiseptic oral rinse  15 mL Mouth Rinse Q4H   Chlorhexidine Gluconate Cloth  6 each Topical q morning   enoxaparin (LOVENOX) injection  40 mg Subcutaneous Q24H   feeding supplement  1 Container Oral TID BM   feeding supplement  237 mL Oral Q24H   fiber supplement (BANATROL TF)  60 mL Oral BID   furosemide  20 mg Oral BID   lipase/protease/amylase  36,000 Units Oral TID AC   megestrol  400 mg Oral BID   midodrine  10 mg Oral TID WC   mirtazapine  15 mg Oral QHS   multivitamin with minerals  1 tablet Oral Daily   pantoprazole  40 mg Oral BID   venlafaxine  37.5 mg Oral BID WC   Continuous Infusions:   LOS: 12 days    Time spent: 49 minutes spent on chart review, discussion with nursing staff, consultants, updating family and interview/physical exam; more than 50% of that time was spent in counseling and/or coordination of care.    Jeremie Abdelaziz J [place] DO [facility] Available via Epic secure chat 7am-7pm After these hours, please refer to coverage provider listed on [email or web address] 12/28/2021, 11:04 AM

## 2021-12-28 NOTE — Progress Notes (Signed)
Calorie Count Note  48 hour calorie count ordered. Envelope was removed from door so meal tickets not available. Intakes reported from patient and estimated from what was documented in flowsheets.  Diet: regular Supplements: Boost Breeze po TID, each supplement provides 250 kcal and 9 grams of protein   9/15: Breakfast: 430 kcals, 8g protein Lunch: refused meal Dinner: not documented Supplements: 1/2 Boost Breeze: 125 kcal,s 4g protein  Total intake: 555 kcal (30% of minimum estimated needs)  12g protein (12% of minimum estimated needs)  9/16: Breakfast: 380 kcals, 4g protein Lunch: 420 kcals, 20g protein Dinner: 245 kcals, 7g protein Supplements: 1/2 Boost Breeze: 125 kcals, 4g protein, 1 Banatrol: 45 kcals, 2g protein  Total intake: 1215 kcal (65% of minimum estimated needs)  37g protein (38% of minimum estimated needs)  9/18:  B: 220 kcals, 15g protein L: 482 kcals, 9g protein D: 200 kcals, 18g protein Supplements: 1/2 Boost Breeze: 125 kcals, 4g protein  Total intake: 1027 kcal (55% of minimum estimated needs)  46g protein (48% of minimum estimated needs)  This AM: Pt consumed ~300 kcals, 14g protein  Nutrition Dx: Moderate Malnutrition related to chronic illness, cancer and cancer related treatments as evidenced by mild fat depletion, mild muscle depletion.    Goal: Pt to meet >/= 90% of their estimated nutrition needs   Intervention:  -Continue Boost Breeze -Trial Ensure supplement daily -Encourage PO intakes -Family to bring meals in for pt as desired -Multivitamin with minerals daily  Clayton Bibles, MS, RD, LDN Inpatient Clinical Dietitian Contact information available via Amion

## 2021-12-28 NOTE — Progress Notes (Signed)
Overall, it is hard to say if Ms. Kroening is improving significantly.  I know that she is being worked on by physical therapy and mobility.  I really need to see what her calorie count was over the weekend.  Her albumin is only 1.7.  This is quite troublesome to me.  She had a chest x-ray yesterday.  This showed a left pleural effusion has come back.  I suspect this is from the abdominal ascites.  I will try to get some fluid off her abdomen.  She had an EKG yesterday.  This did show bigeminy.,  I am not sure what this indicates.  Her potassium is okay.  She does not seem to be symptomatic with this.  Her labs show potassium 3.6.  BUN 31 creatinine 1.13.  Calcium is 8.7 with an albumin of 1.7.  Her white cell count is still elevated 35.8.  This could be a paraneoplastic process.  I do not think she has any infection.  Her hemoglobin is 10.6.  The viral wound on the upper lip seems to be drying up quite nicely.  She is on Zovirax ointment.  She is having no bleeding.  She is having no obvious diarrhea.  She is having no cough or shortness of breath.  I think she still is on supplemental oxygen.  Again, this is going to come down to nutrition.  We will see what her prealbumin is.  I am unsure if she really is ready for any kind of rehab or skilled nursing.  I just worry about the amount of care that she would get.  I am going to repeat another CA125 on her.  I think can be very difficult to give her any more treatment.  I thought about giving her a PARP inhibitor along with Avastin.  I am sure that she is fairly strong enough to handle this.  I just do not believe that she is going to be a surgical candidate at the end of all of this.  That would be our ultimate goal to try to help cure this cancer.  I do still think that she is going to be strong enough or have a good enough response to treatment to be able to have a successful surgery.  We will just have to see how things go.  I do appreciate  the outstanding care that she is getting from all the staff upon 4 W.  Lattie Haw, MD  Darlyn Chamber 17:14

## 2021-12-28 NOTE — Plan of Care (Signed)
  Problem: Education: Goal: Knowledge of General Education information will improve Description: Including pain rating scale, medication(s)/side effects and non-pharmacologic comfort measures 12/28/2021 1903 by Jerene Pitch, RN Outcome: Progressing 12/28/2021 1903 by Jerene Pitch, RN Outcome: Progressing   Problem: Health Behavior/Discharge Planning: Goal: Ability to manage health-related needs will improve 12/28/2021 1903 by Jerene Pitch, RN Outcome: Progressing 12/28/2021 1903 by Jerene Pitch, RN Outcome: Progressing

## 2021-12-28 NOTE — Plan of Care (Signed)
  Problem: Education: Goal: Knowledge of General Education information will improve Description Including pain rating scale, medication(s)/side effects and non-pharmacologic comfort measures Outcome: Progressing   Problem: Health Behavior/Discharge Planning: Goal: Ability to manage health-related needs will improve Outcome: Progressing   

## 2021-12-29 ENCOUNTER — Other Ambulatory Visit: Payer: Self-pay | Admitting: Family Medicine

## 2021-12-29 ENCOUNTER — Encounter: Payer: Medicare Other | Admitting: Dietician

## 2021-12-29 DIAGNOSIS — I10 Essential (primary) hypertension: Secondary | ICD-10-CM | POA: Diagnosis not present

## 2021-12-29 DIAGNOSIS — R188 Other ascites: Secondary | ICD-10-CM | POA: Diagnosis not present

## 2021-12-29 DIAGNOSIS — C561 Malignant neoplasm of right ovary: Secondary | ICD-10-CM | POA: Diagnosis not present

## 2021-12-29 DIAGNOSIS — D701 Agranulocytosis secondary to cancer chemotherapy: Secondary | ICD-10-CM | POA: Diagnosis not present

## 2021-12-29 DIAGNOSIS — J9601 Acute respiratory failure with hypoxia: Secondary | ICD-10-CM | POA: Diagnosis not present

## 2021-12-29 DIAGNOSIS — E860 Dehydration: Secondary | ICD-10-CM | POA: Diagnosis not present

## 2021-12-29 DIAGNOSIS — E7800 Pure hypercholesterolemia, unspecified: Secondary | ICD-10-CM

## 2021-12-29 LAB — CBC WITH DIFFERENTIAL/PLATELET
Abs Immature Granulocytes: 1.87 10*3/uL — ABNORMAL HIGH (ref 0.00–0.07)
Basophils Absolute: 0.1 10*3/uL (ref 0.0–0.1)
Basophils Relative: 0 %
Eosinophils Absolute: 0 10*3/uL (ref 0.0–0.5)
Eosinophils Relative: 0 %
HCT: 33.7 % — ABNORMAL LOW (ref 36.0–46.0)
Hemoglobin: 10.8 g/dL — ABNORMAL LOW (ref 12.0–15.0)
Immature Granulocytes: 6 %
Lymphocytes Relative: 7 %
Lymphs Abs: 2 10*3/uL (ref 0.7–4.0)
MCH: 28.5 pg (ref 26.0–34.0)
MCHC: 32 g/dL (ref 30.0–36.0)
MCV: 88.9 fL (ref 80.0–100.0)
Monocytes Absolute: 1.3 10*3/uL — ABNORMAL HIGH (ref 0.1–1.0)
Monocytes Relative: 4 %
Neutro Abs: 25.3 10*3/uL — ABNORMAL HIGH (ref 1.7–7.7)
Neutrophils Relative %: 83 %
Platelets: 421 10*3/uL — ABNORMAL HIGH (ref 150–400)
RBC: 3.79 MIL/uL — ABNORMAL LOW (ref 3.87–5.11)
RDW: 16.1 % — ABNORMAL HIGH (ref 11.5–15.5)
WBC: 30.6 10*3/uL — ABNORMAL HIGH (ref 4.0–10.5)
nRBC: 0 % (ref 0.0–0.2)

## 2021-12-29 LAB — COMPREHENSIVE METABOLIC PANEL
ALT: 15 U/L (ref 0–44)
AST: 20 U/L (ref 15–41)
Albumin: 1.6 g/dL — ABNORMAL LOW (ref 3.5–5.0)
Alkaline Phosphatase: 220 U/L — ABNORMAL HIGH (ref 38–126)
Anion gap: 10 (ref 5–15)
BUN: 27 mg/dL — ABNORMAL HIGH (ref 8–23)
CO2: 21 mmol/L — ABNORMAL LOW (ref 22–32)
Calcium: 8 mg/dL — ABNORMAL LOW (ref 8.9–10.3)
Chloride: 108 mmol/L (ref 98–111)
Creatinine, Ser: 0.95 mg/dL (ref 0.44–1.00)
GFR, Estimated: >60 mL/min (ref >60)
Glucose, Bld: 96 mg/dL (ref 70–99)
Potassium: 3.1 mmol/L — ABNORMAL LOW (ref 3.5–5.1)
Sodium: 139 mmol/L (ref 135–145)
Total Bilirubin: 0.3 mg/dL (ref 0.3–1.2)
Total Protein: 4.8 g/dL — ABNORMAL LOW (ref 6.5–8.1)

## 2021-12-29 LAB — CYTOLOGY - NON PAP

## 2021-12-29 LAB — MAGNESIUM: Magnesium: 1.3 mg/dL — ABNORMAL LOW (ref 1.7–2.4)

## 2021-12-29 MED ORDER — POTASSIUM CHLORIDE 10 MEQ/50ML IV SOLN
10.0000 meq | INTRAVENOUS | Status: AC
  Administered 2021-12-29 (×4): 10 meq via INTRAVENOUS
  Filled 2021-12-29 (×4): qty 50

## 2021-12-29 MED ORDER — MAGNESIUM SULFATE 2 GM/50ML IV SOLN
2.0000 g | INTRAVENOUS | Status: AC
  Administered 2021-12-29 (×2): 2 g via INTRAVENOUS
  Filled 2021-12-29 (×2): qty 50

## 2021-12-29 MED ORDER — DRONABINOL 2.5 MG PO CAPS
2.5000 mg | ORAL_CAPSULE | Freq: Two times a day (BID) | ORAL | Status: DC
  Administered 2021-12-29 – 2022-01-03 (×12): 2.5 mg via ORAL
  Filled 2021-12-29 (×12): qty 1

## 2021-12-29 NOTE — TOC Progression Note (Signed)
Transition of Care [facility]) - Progression Note    Patient Details  Name: Maria Romero MRN: [id number] Date of Birth: [birth year]  Transition [facility] CM/SW Contact  Joaquin Courts, RN Phone Number: 12/29/2021, 2:29 PM  Clinical Narrative:    CM followed up with patient regarding snf bed choice, patient advised that her daughter wishes to speak with CM.  CM contacted daughter who requests facility closer to [facility].  CM contacted facilities with pending bed offers in the [place] area, no new bed offers received.  CM communicated this information to patient's daughter.  Who reports she will stop by the hospital today and review the CMS medicare ratings list provided for bed offers to make a choice.  TOC is awaiting bed selection to initiate auth.   Expected Discharge Plan: [place] Barriers to Discharge: Continued Medical Work up  Expected Discharge Plan and Services Expected Discharge Plan: [place] In-house Referral: NA Discharge Planning Services: CM Consult Post Acute Care Choice: [place] arrangements for the past 2 months: Single Family Home                 DME Arranged: N/A DME Agency: NA       HH Arranged: PT HH Agency: [facility] Date [place]: 12/22/21 Time [place]: [phone] Representative spoke with at [place]: [place] Determinants of Health ([place]) Interventions    Readmission Risk Interventions     No data to display

## 2021-12-29 NOTE — Progress Notes (Signed)
Overall, Maria Romero might be a little bit better.  She is off oxygen right now.  Unfortunately, her prealbumin is still only 6.  She just is not getting in enough nutrition.  I know she has the calorie count done.  It does not look like she took an as many calories as she needed.  She says that she is little more hungry.  Looks like the ascites might be decreasing.  She only had 1.3 L of fluid removed.  We will be interesting to see what the CA-125 looks like.  She does not have any pain.  She is has there is no diarrhea.  Her magnesium is 1.3.  Potassium 3.1.  She is getting some IV magnesium right now.  She probably needs some IV potassium.  This may help her heart.  Her white cell count is 30.6.  Hemoglobin 10.8.  Platelet count 421,000.  I am not sure she sat up in a chair yesterday.  This, I think, will be very helpful for her.  Maybe, she will have some physical therapy.  It looks like she is planning to be moved to rehab.  She really would like to go to a facility in [place] or maybe [place].  This would be a whole lot closer for she and her family.  Hopefully, this can be worked on.  Her vital signs show temperature of 97.6.  Pulse 52.  Blood pressure 105/62.  Her lungs sound clear bilaterally.  Cardiac exam regular rate and rhythm.  She has occasional extra beat.  Abdomen is soft.  I really do not detect any obvious fluid wave.  There is no mass.  There is no guarding or rebound tenderness.  Extremity shows no clubbing, cyanosis or edema.  Neurological exam is nonfocal.  I would like to think that we are following start to see some progress.  We will be instructive as to what the CA-125 is.  Again, this is all about nutrition.  Somehow, we have to get more nutrition into her inferior have any hopes of trying to help with his cancer.  I would like to try chemotherapy on her.  I also know she could handle chemotherapy.  I think we do any chemotherapy would be single agent.   I would probably consider single agent carboplatinum along with Avastin may be a PARP inhibitor.  I still want to try to be aggressive we can.  I still would like to try to get this tumor removed if possible.  Again if she can be placed in a facility close to where she lives in [place], that would make life a lot easier for her family.  Her daughter is a Pharmacist, hospital and it would make life a lot easier for her if Ms. Potvin could be placed in a rehab facility in [place] or in the surrounding area.  I do appreciate everybody's help.  Lattie Haw, MD  [place] 5:4

## 2021-12-29 NOTE — Plan of Care (Signed)
  Problem: Education: Goal: Knowledge of General Education information will improve Description Including pain rating scale, medication(s)/side effects and non-pharmacologic comfort measures Outcome: Progressing   Problem: Health Behavior/Discharge Planning: Goal: Ability to manage health-related needs will improve Outcome: Progressing   

## 2021-12-29 NOTE — Progress Notes (Signed)
PROGRESS NOTE    [name]  KPT:[id number] DOB: 1950-08-26 DOA: 12/15/2021 PCP: Hali Marry, MD    Chief Complaint  Patient presents with   Weakness    Brief Narrative:   Maria Romero is a 71 y.o. 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  Patient recently started on chemotherapy with carboplatinum/Taxol on 8/29 and subsequent 3 days later she began to have persistent diarrhea with decreased oral intake.  Patient denied any nausea/vomiting and no fever/chills.  She was seen in the oncology office on 9/5 and given IV fluids as well as Neulasta for white cell count of 800.  She returned to the oncology office due to continued symptoms and generalized weakness.  She was then advised to present to the ED for further evaluation.  Patient was transferred to [facility] under the hospitalist service.  Assessment & Plan:   Principal Problem:   Neutropenia ([place]) Active Problems:   Essential hypertension   Malignant ascites   [name], right (HCC)   Hyponatremia   Hyperkalemia   AKI (acute kidney injury) ([place])   Pleural effusion on left   Acute respiratory failure with hypoxia (HCC)   Diarrhea   Malnutrition of moderate degree   Orthostatic hypotension   Acute respiratory failure with hypoxia secondary to pleural effusions. Chest x-ray on admission shows moderate pleural bilateral pleural effusions. Underwent IR guided left thoracentesis on 9/14 and 800 mL of fluid removed.   She also went left thoracentesis on 9/15 and 400 mL of fluid removed. Continue with Lasix 20 mg daily. Nasal cannula oxygen to keep sats greater than 90%.   Acute renal  failure Resolved Creatinine at 1.65 on admission improved to 1.13.   Hyperkalemia Resolved with Lokelma.   Right stage IIIc ovarian cancer Patient completed first session of neoadjuvant chemotherapy on 12/07/2021. Patient very deconditioned and overall poor prognosis Patient following with oncology    Malignant ascites Patient underwent paracentesis on 12/16/2021 and 5 L of fluid removed. Patient underwent paracentesis on 9/13 and 4 L of fluid removed Patient underwent paracentesis on 9/20  and a total of approximately 1.3 L of clear yellow fluid was removed.    Hypokalemia and Hypomagnesemia:  Replaced.  Recheck in levels in am.    Viral lip lesion - improving.  Acyclovir ointment.    Orthostatic Hypotension:  Continue with Midodrine.    Acute Urinary Retention:  Resolved.    ANEMIA of chronic disease:  S/p 1 prbc transfusion on 9/14.  Transfuse to keep hemoglobin greater than 7.    GERD Continue with PPI.    Depression:  Continue with venlafaxine    Hyperlipidemia:  Continue with lipitor    Weakness and deconditioning / Debility:  Therapy eval recommending SNF placement.      DVT prophylaxis:  Code Status: full code.  Family Communication: none at bedside.  Disposition:   Status is: Inpatient Remains inpatient appropriate because: IV therapies.    Level of care: Med-Surg Consultants:  Oncology.   Procedures: none.   Antimicrobials: none.    Subjective Reports feeling weak and tired.   Objective: Vitals:   12/28/21 1430 12/28/21 1624 12/28/21 2136 12/29/21 0409  BP: 114/70 108/70 114/77 105/62  Pulse:  (!) 55 62 (!) 52  Resp:  '18 18 20  '$ Temp:  98.4 F (36.9 C) (!) 97.5 F (36.4 C) 97.6 F (36.4 C)  TempSrc:  Oral Oral Oral  SpO2:  93% 96% 94%  Weight:      Height:        Intake/Output Summary (Last 24 hours) at 12/29/2021 1101 Last data filed at 12/29/2021 1034 Gross per 24 hour  Intake 213.59 ml  Output --  Net  213.59 ml   Filed Weights   12/15/21 2000 12/25/21 1000 12/25/21 1800  Weight: 69.5 kg 60.6 kg 60.6 kg    Examination:  General exam: Appears calm and comfortable  Respiratory system: Clear to auscultation. Respiratory effort normal. Cardiovascular system: S1 & S2 heard, RRR. No JVD, No pedal edema. Gastrointestinal system: Abdomen is nondistended, soft and nontender.  Central nervous system: Alert and oriented. No focal neurological deficits. Extremities: Symmetric 5 x 5 power. Skin: No rashes, lesions or ulcers Psychiatry: Mood & affect appropriate.     Data Reviewed: I have personally reviewed following labs and imaging studies  CBC: Recent Labs  Lab 12/24/21 0334 12/25/21 0340 12/27/21 0455 12/28/21 0410 12/29/21 0406  WBC 26.2* 37.0* 32.9* 35.8* 30.6*  NEUTROABS 20.6* 30.3* 27.1* 30.1* 25.3*  HGB 12.2 12.0 12.0 10.6* 10.8*  HCT 37.0 36.7 37.8 33.8* 33.7*  MCV 87.1 88.6 88.9 89.4 88.9  PLT 223 278 366 379 421*    Basic Metabolic Panel: Recent Labs  Lab 12/24/21 0334 12/25/21 0340 12/27/21 0455 12/28/21 0410 12/29/21 0406  NA 133* 136 136 137 139  K 3.8 4.3 4.3 3.6 3.1*  CL 108 110 108 109 108  CO2 19* 19* 19* 21* 21*  GLUCOSE 98 112* 88 113* 96  BUN 33* 31* 33* 31* 27*  CREATININE 0.94 1.19* 1.12* 1.13* 0.95  CALCIUM 8.5* 9.0 9.3 8.7* 8.0*  MG  --   --   --   --  1.3*    GFR: Estimated Creatinine Clearance: 44.3 mL/min (by C-G formula based on SCr of 0.95 mg/dL).  Liver Function Tests: Recent Labs  Lab 12/24/21 0334 12/25/21 0340 12/27/21 0455 12/28/21 0410 12/29/21 0406  AST '21 19 25 24 20  '$ ALT '13 14 18 19 15  '$ ALKPHOS 222* 244* 276* 257* 220*  BILITOT 0.6 0.5 0.4 0.4 0.3  PROT 4.8* 4.9* 5.5* 4.8* 4.8*  ALBUMIN 1.9* 1.9* 1.9* 1.7* 1.6*    CBG: No results for input(s): "GLUCAP" in the last 168 hours.   Recent Results (from the past 240 hour(s))  Remove urinary catheter to obtain Straight Cath urine culture     Status: Abnormal    Collection Time: 12/25/21  6:00 PM   Specimen: In/Out Cath Urine  Result Value Ref Range Status   Specimen Description   Final    IN/OUT CATH URINE Performed at [facility] [address] [place], [place] [address]    Special Requests   Final    Immunocompromised Performed at [facility], [place] [address] [place], [place] [address]    Culture MULTIPLE SPECIES PRESENT, SUGGEST RECOLLECTION (A)  Final   Report Status 12/27/2021 FINAL  Final         Radiology Studies: US Paracentesis  Result Date: 12/28/2021 INDICATION: History of ovarian cancer with recurrent ascites. Request for diagnostic and therapeutic PARACENTESIS. EXAM: ULTRASOUND GUIDED LEFT LOWER QUADRANT PARACENTESIS MEDICATIONS: 1% plain lidocaine, 5 mL COMPLICATIONS: None immediate. PROCEDURE: Informed written consent was obtained from the patient after a discussion of the risks,  benefits and alternatives to treatment. A timeout was performed prior to the initiation of the procedure. Initial ultrasound scanning demonstrates a small amount of ascites within the left lower abdominal quadrant. The left lower abdomen was prepped and draped in the usual sterile fashion. 1% lidocaine was used for local anesthesia. Following this, a 19 gauge, 7-cm, Yueh catheter was introduced. An ultrasound image was saved for documentation purposes. The paracentesis was performed. The catheter was removed and a dressing was applied. The patient tolerated the procedure well without immediate post procedural complication. FINDINGS: A total of approximately 1.3 L of clear yellow fluid was removed. Samples were sent to the laboratory as requested by the clinical team. IMPRESSION: Successful ultrasound-guided paracentesis yielding 1.3 liters of peritoneal fluid. Read by: Ascencion Dike PA-C Electronically Signed   By: Jerilynn Mages.  Shick M.D.   On: 12/28/2021 16:19        Scheduled Meds:  acyclovir ointment   Topical 5 X  Daily   antiseptic oral rinse  15 mL Mouth Rinse Q4H   Chlorhexidine Gluconate Cloth  6 each Topical q morning   dronabinol  2.5 mg Oral BID AC   enoxaparin (LOVENOX) injection  40 mg Subcutaneous Q24H   feeding supplement  1 Container Oral TID BM   feeding supplement  237 mL Oral Q24H   fiber supplement (BANATROL TF)  60 mL Oral BID   furosemide  20 mg Oral BID   lipase/protease/amylase  36,000 Units Oral TID AC   megestrol  400 mg Oral BID   midodrine  10 mg Oral TID WC   mirtazapine  15 mg Oral QHS   multivitamin with minerals  1 tablet Oral Daily   pantoprazole  40 mg Oral BID   venlafaxine  37.5 mg Oral BID WC   Continuous Infusions:  potassium chloride 10 mEq (12/29/21 1029)     LOS: 13 days    Time spent: 40 minutes.     Hosie Poisson, MD [facility]   To contact the attending provider between 7A-7P or the covering provider during after hours 7P-7A, please log into the web site [email or web address] and access using universal [place] password for that web site. If you do not have the password, please call the hospital operator.  12/29/2021, 11:01 AM

## 2021-12-29 NOTE — Progress Notes (Signed)
Physical Therapy Treatment Patient Details Name: Maria Romero MRN: [id number] DOB: Aug 23, 1950 Today's Date: 12/29/2021   History of Present Illness Maria Romero is a 71 y.o. female admitted 9/6 who presents with increasing weakness and persistent diarrhea. could not tolerate thoracentesis Fri 9/8 due to hypotension.  PMH:  stage IIIC serous adenocarinoma of the right ovary on neoadjuvant chemotherapy requiring therapeutic paracentesis, HTN    PT Comments    The  patient continues to be very weak, patient assisted  to recliner with stand and step pivot.  Continue PT , trying to progress to ambulation as tolerated.   Recommendations for follow up therapy are one component of a multi-disciplinary discharge planning process, led by the attending physician.  Recommendations may be updated based on patient status, additional functional criteria and insurance authorization.  Follow Up Recommendations  Skilled nursing-short term rehab (<3 hours/day) Can patient physically be transported by private vehicle: No   Assistance Recommended at Discharge Frequent or constant Supervision/Assistance  Patient can return home with the following A little help with bathing/dressing/bathroom;Assistance with cooking/housework;Direct supervision/assist for medications management;Direct supervision/assist for financial management;Assist for transportation;Help with stairs or ramp for entrance;A lot of help with walking and/or transfers   Equipment Recommendations       Recommendations for Other Services       Precautions / Restrictions Precautions Precautions: Fall Precaution Comments: profoundly weak     Mobility  Bed Mobility   Bed Mobility: Supine to Sit     Supine to sit: Min assist     General bed mobility comments: light HH to raise trunk    Transfers Overall transfer level: Needs assistance Equipment used: None, Rolling walker (2 wheels) Transfers: Sit to/from Stand, Bed to  chair/wheelchair/BSC Sit to Stand: Mod assist           General transfer comment: attempted to stand at [place], could not power up so stood with "bear hug" to rise, able to take steps once in standing.    Ambulation/Gait                   Stairs             Wheelchair Mobility    Modified Rankin (Stroke Patients Only)       Balance   Sitting-balance support: Single extremity supported, Feet supported Sitting balance-Leahy Scale: Fair     Standing balance support: Bilateral upper extremity supported, During functional activity Standing balance-Leahy Scale: Poor Standing balance comment: reliant on support                            Cognition Arousal/Alertness: Awake/alert Behavior During Therapy: Flat affect                                            Exercises  Provided  yellow TB for UE exercises, performed in sitting x 10  Green TB for leg extension.    General Comments        Pertinent Vitals/Pain Pain Assessment Pain Assessment: No/denies pain    Home Living                          Prior Function            PT Goals (current goals can now be found in the  care plan section) Progress towards PT goals: Progressing toward goals    Frequency    Min 2X/week      PT Plan Current plan remains appropriate;Frequency needs to be updated    Co-evaluation              AM-PAC PT "6 Clicks" Mobility   Outcome Measure  Help needed turning from your back to your side while in a flat bed without using bedrails?: A Little Help needed moving from lying on your back to sitting on the side of a flat bed without using bedrails?: A Little Help needed moving to and from a bed to a chair (including a wheelchair)?: A Lot Help needed standing up from a chair using your arms (e.g., wheelchair or bedside chair)?: Total Help needed to walk in hospital room?: Total Help needed climbing 3-5 steps with a  railing? : Total 6 Click Score: 11    End of Session Equipment Utilized During Treatment: Gait belt Activity Tolerance: Patient limited by fatigue Patient left: in chair;with call bell/phone within reach Nurse Communication: Mobility status PT Visit Diagnosis: Muscle weakness (generalized) (M62.81);Difficulty in walking, not elsewhere classified (R26.2)     Time: 5400-8676 PT Time Calculation (min) (ACUTE ONLY): 35 min  Charges:  $Gait Training: 8-22 mins $Therapeutic Exercise: 8-22 mins                     [place] [facility] [phone] Weekend [phone]    Claretha Cooper 12/29/2021, 11:33 AM

## 2021-12-30 DIAGNOSIS — D701 Agranulocytosis secondary to cancer chemotherapy: Secondary | ICD-10-CM | POA: Diagnosis not present

## 2021-12-30 DIAGNOSIS — E876 Hypokalemia: Secondary | ICD-10-CM

## 2021-12-30 DIAGNOSIS — I951 Orthostatic hypotension: Secondary | ICD-10-CM | POA: Diagnosis not present

## 2021-12-30 DIAGNOSIS — J9601 Acute respiratory failure with hypoxia: Secondary | ICD-10-CM | POA: Diagnosis not present

## 2021-12-30 DIAGNOSIS — C561 Malignant neoplasm of right ovary: Secondary | ICD-10-CM | POA: Diagnosis not present

## 2021-12-30 DIAGNOSIS — C786 Secondary malignant neoplasm of retroperitoneum and peritoneum: Secondary | ICD-10-CM | POA: Diagnosis not present

## 2021-12-30 DIAGNOSIS — I499 Cardiac arrhythmia, unspecified: Secondary | ICD-10-CM

## 2021-12-30 DIAGNOSIS — I10 Essential (primary) hypertension: Secondary | ICD-10-CM | POA: Diagnosis not present

## 2021-12-30 LAB — CBC WITH DIFFERENTIAL/PLATELET
Abs Immature Granulocytes: 1.79 10*3/uL — ABNORMAL HIGH (ref 0.00–0.07)
Basophils Absolute: 0.2 10*3/uL — ABNORMAL HIGH (ref 0.0–0.1)
Basophils Relative: 1 %
Eosinophils Absolute: 0 10*3/uL (ref 0.0–0.5)
Eosinophils Relative: 0 %
HCT: 33.6 % — ABNORMAL LOW (ref 36.0–46.0)
Hemoglobin: 10.8 g/dL — ABNORMAL LOW (ref 12.0–15.0)
Immature Granulocytes: 5 %
Lymphocytes Relative: 6 %
Lymphs Abs: 2 10*3/uL (ref 0.7–4.0)
MCH: 28.8 pg (ref 26.0–34.0)
MCHC: 32.1 g/dL (ref 30.0–36.0)
MCV: 89.6 fL (ref 80.0–100.0)
Monocytes Absolute: 1.2 10*3/uL — ABNORMAL HIGH (ref 0.1–1.0)
Monocytes Relative: 4 %
Neutro Abs: 28.9 10*3/uL — ABNORMAL HIGH (ref 1.7–7.7)
Neutrophils Relative %: 84 %
Platelets: 494 10*3/uL — ABNORMAL HIGH (ref 150–400)
RBC: 3.75 MIL/uL — ABNORMAL LOW (ref 3.87–5.11)
RDW: 16.3 % — ABNORMAL HIGH (ref 11.5–15.5)
WBC: 34.2 10*3/uL — ABNORMAL HIGH (ref 4.0–10.5)
nRBC: 0.1 % (ref 0.0–0.2)

## 2021-12-30 LAB — COMPREHENSIVE METABOLIC PANEL
ALT: 20 U/L (ref 0–44)
AST: 25 U/L (ref 15–41)
Albumin: 1.7 g/dL — ABNORMAL LOW (ref 3.5–5.0)
Alkaline Phosphatase: 226 U/L — ABNORMAL HIGH (ref 38–126)
Anion gap: 8 (ref 5–15)
BUN: 26 mg/dL — ABNORMAL HIGH (ref 8–23)
CO2: 22 mmol/L (ref 22–32)
Calcium: 7.9 mg/dL — ABNORMAL LOW (ref 8.9–10.3)
Chloride: 108 mmol/L (ref 98–111)
Creatinine, Ser: 0.92 mg/dL (ref 0.44–1.00)
GFR, Estimated: >60 mL/min (ref >60)
Glucose, Bld: 119 mg/dL — ABNORMAL HIGH (ref 70–99)
Potassium: 3 mmol/L — ABNORMAL LOW (ref 3.5–5.1)
Sodium: 138 mmol/L (ref 135–145)
Total Bilirubin: 0.4 mg/dL (ref 0.3–1.2)
Total Protein: 5 g/dL — ABNORMAL LOW (ref 6.5–8.1)

## 2021-12-30 LAB — MAGNESIUM: Magnesium: 2.1 mg/dL (ref 1.7–2.4)

## 2021-12-30 MED ORDER — POTASSIUM CHLORIDE 10 MEQ/50ML IV SOLN
10.0000 meq | INTRAVENOUS | Status: AC
  Administered 2021-12-30 (×4): 10 meq via INTRAVENOUS
  Filled 2021-12-30 (×4): qty 50

## 2021-12-30 MED ORDER — POTASSIUM CHLORIDE CRYS ER 20 MEQ PO TBCR: 40.0000 meq | EXTENDED_RELEASE_TABLET | Freq: Once | ORAL | Status: DC

## 2021-12-30 MED ORDER — POTASSIUM CHLORIDE 20 MEQ PO PACK
40.0000 meq | PACK | Freq: Once | ORAL | Status: AC
  Administered 2021-12-30: 40 meq via ORAL
  Filled 2021-12-30: qty 2

## 2021-12-30 NOTE — Progress Notes (Signed)
Maria Romero actually does look a little bit better today.  She seems to be eating maybe a little bit more.  I think a calorie count is still being done on her.  She denies any vomiting.  She has had no diarrhea.  She has had no abdominal pain.  It is hard to tell if the ascites is recurring.  I did send off a repeat CA-125 yesterday.  Result is not yet back.  She is not bleeding.  There is no problems with fever.  She still has this abnormal heart rhythm.  She is asymptomatic with this.  Her potassium is on the low side.  I will replace her potassium with IV.  I think she got some potassium yesterday.  I am unsure why she would be wasting some potassium.  I know she got magnesium yesterday.  There is no magnesium result back yet today.  I think she had little bit of physical therapy yesterday.  She did sit up in a chair.  I think that the staff is trying to get her to a rehab center.  This really is necessary.  I think she must have more strength if are going to think by doing any more treatment in the future.  She is not having any diarrhea.  I am happy about that part.  Her CBC shows a white count 34.2.  Hemoglobin 10.8.  Platelet count 194,000.  I suspect that the white cell elevation could be a paraneoplastic result from her underlying malignancy.  She has been better healing of the viral sores on the right side of her upper lip.  Her vital signs show temperature of 98.5.  Pulse 60.  Blood pressure 120/72.  Her oral exam shows no mucositis.  Again, the lesions on the right side of her upper lip are healing in improving.  Her lungs sound clear bilaterally.  I do not hear any wheezing.  She has good air movement bilaterally.  Cardiac exam is still regular rate but irregular rhythm.  Again she had a EKG recently which did show bigeminy PACs.  Her abdominal exam is soft.  There is no obvious fluid wave.  There is no guarding or rebound tenderness.  There is no abdominal mass.  There is no  palpable liver or spleen tip.  Her extremities does show some muscle atrophy in upper and lower extremities.  Neurological exam is nonfocal.  Hopefully, she will continue to improve with her eating.  We will see what her calorie count shows.  I have her on Megace and Marinol to try to help with her appetite.  Maybe, if her ascites does not recur, this will help improve her ability to take in calories.  We will have to see what happens with respect to placing her.  I do appreciate the wonderful care she is gotten from the staff up on [address].  This is incredibly complicated situation and the staff had really done a fantastic job in managing her.  Lattie Haw, MD  Philippians 4:4

## 2021-12-30 NOTE — Plan of Care (Signed)
  Problem: Education: Goal: Knowledge of General Education information will improve Description Including pain rating scale, medication(s)/side effects and non-pharmacologic comfort measures Outcome: Progressing   Problem: Health Behavior/Discharge Planning: Goal: Ability to manage health-related needs will improve Outcome: Progressing   

## 2021-12-30 NOTE — Progress Notes (Signed)
PROGRESS NOTE    Maria Romero  NKN:[id number] DOB: [birth year] DOA: 12/15/2021 PCP: Hali Marry, MD    Chief Complaint  Patient presents with   Weakness    Brief Narrative:   Maria Romero is a 71 y.o. female with past medical history significant for serous adenocarcinoma right ovary stage IIIc on neoadjuvant chemotherapy requiring recurrent paracentesis, essential hypertension who presented to [facility] ED on 9/6 by discretion of her oncologist for progressive shortness of breath, abdominal distention, diarrhea and generalized weakness.  Patient recently started on chemotherapy with carboplatinum/Taxol on 8/29 and subsequent 3 days later she began to have persistent diarrhea with decreased oral intake.  Patient denied any nausea/vomiting and no fever/chills.  She was seen in the oncology office on 9/5 and given IV fluids as well as Neulasta for white cell count of 800.  She returned to the oncology office due to continued symptoms and generalized weakness.  She was then advised to present to the ED for further evaluation.  Patient was transferred to [facility] under the hospitalist service.  Assessment & Plan:   Principal Problem:   Neutropenia ([place]) Active Problems:   Essential hypertension   Malignant ascites   [name], right (HCC)   Hyponatremia   Hyperkalemia   AKI (acute kidney injury) ([place])   Pleural effusion on left   Acute respiratory failure with hypoxia (HCC)   Diarrhea   Malnutrition of moderate degree   Orthostatic hypotension   Acute respiratory failure with hypoxia secondary to pleural effusions. Chest x-ray on admission shows moderate pleural bilateral pleural effusions. Underwent IR guided left thoracentesis on 9/14 and 800 mL of fluid removed.   She also went left thoracentesis on 9/15 and 400 mL of fluid removed. Continue with Lasix 20 mg BID. Nasal cannula oxygen to keep sats greater than 90%. Pt denies any sob, she  reports feeling much better today.   Acute renal failure Resolved Creatinine at 1.65 on admission improved to 1.13.   Hyperkalemia Resolved with Lokelma.   Right stage IIIc ovarian cancer Patient completed first session of neoadjuvant chemotherapy on 12/07/2021. Patient very deconditioned and overall poor prognosis Patient following with oncology    Malignant ascites Patient underwent paracentesis on 12/16/2021 and 5 L of fluid removed. Patient underwent paracentesis on 9/13 and 4 L of fluid removed Patient underwent paracentesis on 9/20  and a total of approximately 1.3 L of clear yellow fluid was removed.    Hypokalemia and Hypomagnesemia:  Replaced.  Check levels tonight.    Viral lip lesion - improving.  Acyclovir ointment.    Orthostatic Hypotension:  Continue with Midodrine.  Resolved.    Acute Urinary Retention:  Resolved.    ANEMIA of chronic disease:  S/p 1 prbc transfusion on 9/14.  Transfuse to keep hemoglobin greater than 7. Repeat hemoglobin at 10.8.    GERD Continue with PPI.    Depression:  Continue with venlafaxine    Hyperlipidemia:  Continue with lipitor    Weakness and deconditioning / Debility:  Therapy eval recommending SNF placement.      DVT prophylaxis: Lovenox.  Code Status: full code.  Family Communication: none at bedside.  Disposition:   Status is: Inpatient Remains inpatient appropriate because:  SNF placement.    Level of care: Med-Surg Consultants:  Oncology.   Procedures: none.   Antimicrobials: none.    Subjective She is feeling much better,no new complaints.  Objective: Vitals:   12/29/21 0409 12/29/21 1212 12/29/21  2056 12/30/21 0613  BP: 105/62 105/63 120/75 120/72  Pulse: (!) 52 (!) 57 (!) 55 60  Resp: '20 20 16 19  '$ Temp: 97.6 F (36.4 C) 97.6 F (36.4 C) 97.9 F (36.6 C) 98.5 F (36.9 C)  TempSrc: Oral Oral  Oral  SpO2: 94% 95% 94% 95%  Weight:      Height:        Intake/Output  Summary (Last 24 hours) at 12/30/2021 1744 Last data filed at 12/30/2021 1534 Gross per 24 hour  Intake 660.56 ml  Output 300 ml  Net 360.56 ml    Filed Weights   12/15/21 2000 12/25/21 1000 12/25/21 1800  Weight: 69.5 kg 60.6 kg 60.6 kg    Examination:  General exam: Appears calm and comfortable  Respiratory system: Clear to auscultation. Respiratory effort normal. Cardiovascular system: S1 & S2 heard, RRR. No JVD,  No pedal edema. Gastrointestinal system: Abdomen is nondistended, soft and nontender. Normal bowel sounds heard. Central nervous system: Alert and oriented. No focal neurological deficits. Extremities: Symmetric 5 x 5 power. Skin: No rashes, lesions or ulcers Psychiatry:  Mood & affect appropriate.      Data Reviewed: I have personally reviewed following labs and imaging studies  CBC: Recent Labs  Lab 12/25/21 0340 12/27/21 0455 12/28/21 0410 12/29/21 0406 12/30/21 0403  WBC 37.0* 32.9* 35.8* 30.6* 34.2*  NEUTROABS 30.3* 27.1* 30.1* 25.3* 28.9*  HGB 12.0 12.0 10.6* 10.8* 10.8*  HCT 36.7 37.8 33.8* 33.7* 33.6*  MCV 88.6 88.9 89.4 88.9 89.6  PLT 278 366 379 421* 494*     Basic Metabolic Panel: Recent Labs  Lab 12/25/21 0340 12/27/21 0455 12/28/21 0410 12/29/21 0406 12/30/21 0403  NA 136 136 137 139 138  K 4.3 4.3 3.6 3.1* 3.0*  CL 110 108 109 108 108  CO2 19* 19* 21* 21* 22  GLUCOSE 112* 88 113* 96 119*  BUN 31* 33* 31* 27* 26*  CREATININE 1.19* 1.12* 1.13* 0.95 0.92  CALCIUM 9.0 9.3 8.7* 8.0* 7.9*  MG  --   --   --  1.3* 2.1     GFR: Estimated Creatinine Clearance: 45.7 mL/min (by C-G formula based on SCr of 0.92 mg/dL).  Liver Function Tests: Recent Labs  Lab 12/25/21 0340 12/27/21 0455 12/28/21 0410 12/29/21 0406 12/30/21 0403  AST '19 25 24 20 25  '$ ALT '14 18 19 15 20  '$ ALKPHOS 244* 276* 257* 220* 226*  BILITOT 0.5 0.4 0.4 0.3 0.4  PROT 4.9* 5.5* 4.8* 4.8* 5.0*  ALBUMIN 1.9* 1.9* 1.7* 1.6* 1.7*     CBG: No results for  input(s): "GLUCAP" in the last 168 hours.   Recent Results (from the past 240 hour(s))  Remove urinary catheter to obtain Straight Cath urine culture     Status: Abnormal   Collection Time: 12/25/21  6:00 PM   Specimen: In/Out Cath Urine  Result Value Ref Range Status   Specimen Description   Final    IN/OUT CATH URINE Performed at [facility] [address], [place], [place] [address]    Special Requests   Final    Immunocompromised Performed at [facility], [place] [address] [place], [place] [address]    Culture MULTIPLE SPECIES PRESENT, SUGGEST RECOLLECTION (A)  Final   Report Status 12/27/2021 FINAL  Final         Radiology Studies: No results found.      Scheduled Meds:  acyclovir ointment   Topical 5 X Daily  antiseptic oral rinse  15 mL Mouth Rinse Q4H   Chlorhexidine Gluconate Cloth  6 each Topical q morning   dronabinol  2.5 mg Oral BID AC   enoxaparin (LOVENOX) injection  40 mg Subcutaneous Q24H   feeding supplement  1 Container Oral TID BM   feeding supplement  237 mL Oral Q24H   fiber supplement (BANATROL TF)  60 mL Oral BID   furosemide  20 mg Oral BID   lipase/protease/amylase  36,000 Units Oral TID AC   megestrol  400 mg Oral BID   midodrine  10 mg Oral TID WC   mirtazapine  15 mg Oral QHS   multivitamin with minerals  1 tablet Oral Daily   pantoprazole  40 mg Oral BID   venlafaxine  37.5 mg Oral BID WC   Continuous Infusions:     LOS: 14 days        Hosie Poisson, MD [facility]   To contact the attending provider between 7A-7P or the covering provider during after hours 7P-7A, please log into the web site [email or web address] and access using universal [place] password for that web site. If you do not have the password, please call the hospital operator.  12/30/2021, 5:44 PM

## 2021-12-31 ENCOUNTER — Encounter: Payer: Self-pay | Admitting: *Deleted

## 2021-12-31 DIAGNOSIS — I499 Cardiac arrhythmia, unspecified: Secondary | ICD-10-CM | POA: Diagnosis not present

## 2021-12-31 DIAGNOSIS — D701 Agranulocytosis secondary to cancer chemotherapy: Secondary | ICD-10-CM | POA: Diagnosis not present

## 2021-12-31 DIAGNOSIS — J9601 Acute respiratory failure with hypoxia: Secondary | ICD-10-CM | POA: Diagnosis not present

## 2021-12-31 DIAGNOSIS — N179 Acute kidney failure, unspecified: Secondary | ICD-10-CM | POA: Diagnosis not present

## 2021-12-31 DIAGNOSIS — C561 Malignant neoplasm of right ovary: Secondary | ICD-10-CM | POA: Diagnosis not present

## 2021-12-31 DIAGNOSIS — E860 Dehydration: Secondary | ICD-10-CM | POA: Diagnosis not present

## 2021-12-31 DIAGNOSIS — C786 Secondary malignant neoplasm of retroperitoneum and peritoneum: Secondary | ICD-10-CM | POA: Diagnosis not present

## 2021-12-31 DIAGNOSIS — E876 Hypokalemia: Secondary | ICD-10-CM | POA: Diagnosis not present

## 2021-12-31 LAB — CBC WITH DIFFERENTIAL/PLATELET
Abs Immature Granulocytes: 1.6 10*3/uL — ABNORMAL HIGH (ref 0.00–0.07)
Basophils Absolute: 0.1 10*3/uL (ref 0.0–0.1)
Basophils Relative: 0 %
Eosinophils Absolute: 0 10*3/uL (ref 0.0–0.5)
Eosinophils Relative: 0 %
HCT: 32.6 % — ABNORMAL LOW (ref 36.0–46.0)
Hemoglobin: 10.4 g/dL — ABNORMAL LOW (ref 12.0–15.0)
Immature Granulocytes: 4 %
Lymphocytes Relative: 5 %
Lymphs Abs: 2 10*3/uL (ref 0.7–4.0)
MCH: 28.7 pg (ref 26.0–34.0)
MCHC: 31.9 g/dL (ref 30.0–36.0)
MCV: 89.8 fL (ref 80.0–100.0)
Monocytes Absolute: 1.5 10*3/uL — ABNORMAL HIGH (ref 0.1–1.0)
Monocytes Relative: 4 %
Neutro Abs: 31.2 10*3/uL — ABNORMAL HIGH (ref 1.7–7.7)
Neutrophils Relative %: 87 %
Platelets: 504 10*3/uL — ABNORMAL HIGH (ref 150–400)
RBC: 3.63 MIL/uL — ABNORMAL LOW (ref 3.87–5.11)
RDW: 16.5 % — ABNORMAL HIGH (ref 11.5–15.5)
WBC: 36.4 10*3/uL — ABNORMAL HIGH (ref 4.0–10.5)
nRBC: 0 % (ref 0.0–0.2)

## 2021-12-31 LAB — COMPREHENSIVE METABOLIC PANEL
ALT: 19 U/L (ref 0–44)
AST: 22 U/L (ref 15–41)
Albumin: 1.7 g/dL — ABNORMAL LOW (ref 3.5–5.0)
Alkaline Phosphatase: 211 U/L — ABNORMAL HIGH (ref 38–126)
Anion gap: 7 (ref 5–15)
BUN: 21 mg/dL (ref 8–23)
CO2: 22 mmol/L (ref 22–32)
Calcium: 7.9 mg/dL — ABNORMAL LOW (ref 8.9–10.3)
Chloride: 110 mmol/L (ref 98–111)
Creatinine, Ser: 0.8 mg/dL (ref 0.44–1.00)
GFR, Estimated: >60 mL/min (ref >60)
Glucose, Bld: 116 mg/dL — ABNORMAL HIGH (ref 70–99)
Potassium: 3.5 mmol/L (ref 3.5–5.1)
Sodium: 139 mmol/L (ref 135–145)
Total Bilirubin: 0.4 mg/dL (ref 0.3–1.2)
Total Protein: 5.3 g/dL — ABNORMAL LOW (ref 6.5–8.1)

## 2021-12-31 LAB — MAGNESIUM: Magnesium: 1.6 mg/dL — ABNORMAL LOW (ref 1.7–2.4)

## 2021-12-31 LAB — TOTAL BILIRUBIN, BODY FLUID: Total bilirubin, fluid: 0.7 mg/dL

## 2021-12-31 MED ORDER — POTASSIUM CHLORIDE CRYS ER 20 MEQ PO TBCR
40.0000 meq | EXTENDED_RELEASE_TABLET | Freq: Once | ORAL | Status: AC
  Administered 2021-12-31: 40 meq via ORAL
  Filled 2021-12-31: qty 2

## 2021-12-31 MED ORDER — FUROSEMIDE 20 MG PO TABS
20.0000 mg | ORAL_TABLET | Freq: Every day | ORAL | Status: DC
  Administered 2021-12-31 – 2022-01-02 (×3): 20 mg via ORAL
  Filled 2021-12-31 (×3): qty 1

## 2021-12-31 MED ORDER — MAGNESIUM SULFATE 2 GM/50ML IV SOLN
2.0000 g | Freq: Once | INTRAVENOUS | Status: AC
  Administered 2021-12-31: 2 g via INTRAVENOUS
  Filled 2021-12-31: qty 50

## 2021-12-31 MED ORDER — POTASSIUM CHLORIDE 10 MEQ/50ML IV SOLN
10.0000 meq | INTRAVENOUS | Status: AC
  Administered 2021-12-31 (×3): 10 meq via INTRAVENOUS
  Filled 2021-12-31 (×3): qty 50

## 2021-12-31 NOTE — Progress Notes (Signed)
PROGRESS NOTE    Maria Romero  WVP:[id number] DOB: 04/09/1951 DOA: 12/15/2021 PCP: Hali Marry, MD    Chief Complaint  Patient presents with   Weakness    Brief Narrative:   Maria Romero is a 71 y.o. female with past medical history significant for serous adenocarcinoma right ovary stage IIIc on neoadjuvant chemotherapy requiring recurrent paracentesis, essential hypertension who presented to [facility] ED on 9/6 by discretion of her oncologist for progressive shortness of breath, abdominal distention, diarrhea and generalized weakness.  Patient recently started on chemotherapy with carboplatinum/Taxol on 8/29 and subsequent 3 days later she began to have persistent diarrhea with decreased oral intake.  Patient denied any nausea/vomiting and no fever/chills.  She was seen in the oncology office on 9/5 and given IV fluids as well as Neulasta for white cell count of 800.  She returned to the oncology office due to continued symptoms and generalized weakness.  She was then advised to present to the ED for further evaluation.  Patient was transferred to [facility] under the hospitalist service.  Assessment & Plan:   Principal Problem:   Neutropenia ([place]) Active Problems:   Essential hypertension   Malignant ascites   [name], right (HCC)   Hyponatremia   Hyperkalemia   AKI (acute kidney injury) ([place])   Pleural effusion on left   Acute respiratory failure with hypoxia (HCC)   Diarrhea   Malnutrition of moderate degree   Orthostatic hypotension   Acute respiratory failure with hypoxia secondary to pleural effusions. Chest x-ray on admission shows moderate pleural bilateral pleural effusions. Underwent IR guided left thoracentesis on 9/14 and 800 mL of fluid removed.   She also went left thoracentesis on 9/15 and 400 mL of fluid removed. Continue with Lasix 20 mg BID. Nasal cannula oxygen to keep sats greater than 90%. Pt denies any sob, she  reports feeling much better today. She has been weaned off oxygen.  Therapy evaluations recommending SNF.  Currently waiting for SNF placement.   Acute renal failure Resolved Creatinine at 1.65 on admission . Creatinine back to baseline now with IV fluids.    Hyperkalemia Resolved with Lokelma.   Right stage IIIc ovarian cancer Patient completed first session of neoadjuvant chemotherapy on 12/07/2021. Patient very deconditioned and overall poor prognosis Patient following with oncology. Recommend outpatient follow up with Dr Marin Olp for further chemotherapy.     Malignant ascites Patient underwent paracentesis on 12/16/2021 and 5 L of fluid removed. Patient underwent paracentesis on 9/13 and 4 L of fluid removed Patient underwent paracentesis on 9/20  and a total of approximately 1.3 L of clear yellow fluid was removed.    Hypokalemia and Hypomagnesemia:  Replaced.     Viral lip lesion - improving.  Acyclovir ointment.    Orthostatic Hypotension:  Continue with Midodrine.  Resolved.    Acute Urinary Retention:  Resolved.    ANEMIA of chronic disease:  S/p 1 prbc transfusion on 9/14.  Transfuse to keep hemoglobin greater than 7. Repeat hemoglobin stable at 10.    GERD Continue with PPI.    Depression:  Continue with venlafaxine    Hyperlipidemia:  Continue with lipitor   Neutropenia resolved.  Patient received Neulasta in oncology clinic , would explain, the  Leukocytosis and thrombocytosis    Weakness and deconditioning / Debility:  Therapy eval recommending SNF placement.  She is medically stable for discharge.   Moderate protein calorie malnutrition.  On Marinol for improving appetite.  Diarrhea  Sec to chemo.  C diff and GI pcr is NEG. Lomotil as needed.  Supportive care.    DVT prophylaxis: Lovenox.  Code Status: full code.  Family Communication: none at bedside.  Disposition:   Status is: Inpatient Remains inpatient  appropriate because:  SNF placement.    Level of care: Med-Surg Consultants:  Oncology.   Procedures: none.   Antimicrobials: none.    Subjective No new complaints today.  Family at bedside and answered questions . Objective: Vitals:   12/30/21 2049 12/31/21 0432 12/31/21 0500 12/31/21 1326  BP: 117/83 139/81  123/79  Pulse: 89 (!) 52 97 99  Resp: '20 20  16  '$ Temp: 98.2 F (36.8 C) 98 F (36.7 C)  97.9 F (36.6 C)  TempSrc: Oral Oral  Oral  SpO2: 93% 95%  93%  Weight:      Height:        Intake/Output Summary (Last 24 hours) at 12/31/2021 1402 Last data filed at 12/31/2021 0900 Gross per 24 hour  Intake 480.56 ml  Output 750 ml  Net -269.44 ml    Filed Weights   12/15/21 2000 12/25/21 1000 12/25/21 1800  Weight: 69.5 kg 60.6 kg 60.6 kg    Examination:  General exam: Appears calm and comfortable  Respiratory system: diminished air entry at bases.  Cardiovascular system: S1 & S2 heard, RRR. No JVD,  Gastrointestinal system: Abdomen is  soft and nontender. Central nervous system: Alert and oriented. No focal neurological deficits. Extremities: Symmetric 5 x 5 power. Skin: No rashes, lesions or ulcers Psychiatry: Mood & affect appropriate.       Data Reviewed: I have personally reviewed following labs and imaging studies  CBC: Recent Labs  Lab 12/27/21 0455 12/28/21 0410 12/29/21 0406 12/30/21 0403 12/31/21 0258  WBC 32.9* 35.8* 30.6* 34.2* 36.4*  NEUTROABS 27.1* 30.1* 25.3* 28.9* 31.2*  HGB 12.0 10.6* 10.8* 10.8* 10.4*  HCT 37.8 33.8* 33.7* 33.6* 32.6*  MCV 88.9 89.4 88.9 89.6 89.8  PLT 366 379 421* 494* 504*     Basic Metabolic Panel: Recent Labs  Lab 12/27/21 0455 12/28/21 0410 12/29/21 0406 12/30/21 0403 12/31/21 0258  NA 136 137 139 138 139  K 4.3 3.6 3.1* 3.0* 3.5  CL 108 109 108 108 110  CO2 19* 21* 21* 22 22  GLUCOSE 88 113* 96 119* 116*  BUN 33* 31* 27* 26* 21  CREATININE 1.12* 1.13* 0.95 0.92 0.80  CALCIUM 9.3 8.7* 8.0*  7.9* 7.9*  MG  --   --  1.3* 2.1 1.6*     GFR: Estimated Creatinine Clearance: 52.6 mL/min (by C-G formula based on SCr of 0.8 mg/dL).  Liver Function Tests: Recent Labs  Lab 12/27/21 0455 12/28/21 0410 12/29/21 0406 12/30/21 0403 12/31/21 0258  AST '25 24 20 25 22  '$ ALT '18 19 15 20 19  '$ ALKPHOS 276* 257* 220* 226* 211*  BILITOT 0.4 0.4 0.3 0.4 0.4  PROT 5.5* 4.8* 4.8* 5.0* 5.3*  ALBUMIN 1.9* 1.7* 1.6* 1.7* 1.7*     CBG: No results for input(s): "GLUCAP" in the last 168 hours.   Recent Results (from the past 240 hour(s))  Remove urinary catheter to obtain Straight Cath urine culture     Status: Abnormal   Collection Time: 12/25/21  6:00 PM   Specimen: In/Out Cath Urine  Result Value Ref Range Status   Specimen Description   Final    IN/OUT CATH URINE Performed at [facility] [address]  [place], [place] [address]    Special Requests   Final    Immunocompromised Performed at [name], [place] [address], [place], [place] [address]    Culture MULTIPLE SPECIES PRESENT, SUGGEST RECOLLECTION (A)  Final   Report Status 12/27/2021 FINAL  Final         Radiology Studies: No results found.      Scheduled Meds:  acyclovir ointment   Topical 5 X Daily   antiseptic oral rinse  15 mL Mouth Rinse Q4H   Chlorhexidine Gluconate Cloth  6 each Topical q morning   dronabinol  2.5 mg Oral BID AC   enoxaparin (LOVENOX) injection  40 mg Subcutaneous Q24H   feeding supplement  1 Container Oral TID BM   feeding supplement  237 mL Oral Q24H   fiber supplement (BANATROL TF)  60 mL Oral BID   furosemide  20 mg Oral Daily   lipase/protease/amylase  36,000 Units Oral TID AC   megestrol  400 mg Oral BID   midodrine  10 mg Oral TID WC   mirtazapine  15 mg Oral QHS   multivitamin with minerals  1 tablet Oral Daily   pantoprazole  40 mg Oral BID   venlafaxine  37.5 mg Oral BID WC   Continuous Infusions:     LOS: 15 days         Hosie Poisson, MD [facility]   To contact the attending provider between 7A-7P or the covering provider during after hours 7P-7A, please log into the web site [email or web address] and access using universal [place] password for that web site. If you do not have the password, please call the hospital operator.  12/31/2021, 2:02 PM

## 2021-12-31 NOTE — Progress Notes (Signed)
Mobility Specialist - Progress Note   12/31/21 1536  Mobility  HOB Elevated/Bed Position Self regulated  Activity Ambulated with assistance in room  Level of Assistance Minimal assist, patient does 75% or more  Assistive Device Front wheel walker  Distance Ambulated (ft) 10 ft  Activity Response Tolerated well  $Mobility charge 1 Mobility   Pt was found in bed and agreeable to ambulate. Pt fatigued quickly and stated feeling weak since she has not been out of bed much. At EOS returned to bed with all necessities in reach.  Ferd Hibbs Mobility Specialist

## 2021-12-31 NOTE — Progress Notes (Signed)
Patient continues to be hospitalized. Plan for dc includes rehab or SNF. Will continue to follow for post discharge needs and office follow up.   Oncology Nurse Navigator Documentation     12/31/2021    8:30 AM  Oncology Nurse Navigator Flowsheets  Navigator Follow Up Date: 01/07/2022  Navigator Follow Up Reason: Appointment Review  Navigator Location [facility]  Navigator Encounter Type Appt/Treatment Plan Review  Patient Visit Type MedOnc  Treatment Phase Active Tx  Barriers/Navigation Needs Coordination of Care;Education  Interventions None Required  Acuity Level 2-Minimal Needs (1-2 Barriers Identified)  Support Groups/Services Friends and Family  Time Spent with Patient 15

## 2021-12-31 NOTE — Progress Notes (Signed)
I do think that Maria Romero is fine making a little bit of progress.  She just sounds stronger.  She seems to be eating a little bit more from what she says.  I know she is having a calorie count done.  Her labs still show the elevated white cell count of 36.4.  Hemoglobin 10.4.  Maybe, the Marinol that we have her on is helping with the appetite.  Her chemical study show a BUN of 21 creatinine 0.8.  Calcium is 7.9 with an albumin of 1.7.  Her sodium is 139 potassium 3.5.  She is sitting up in a chair little bit more.  She has had no obvious diarrhea.  We are still trying to sort out how we can get her to rehab.  I think her daughter and the social worker will meet today.  She is not having any pain.  There has been no nausea or vomiting.  She has had no cough or shortness of breath.  The herpetic lesions on her upper lip are healing nicely.  She has had no fever.  She has had no bleeding.  Her vital signs show temperature of 98.  Pulse 97.  Blood pressure 139/81.  Her lungs sound clear bilaterally.  Cardiac exam regular rate and rhythm.  She does have the irregular rhythm on occasion.  Abdomen is soft.  I really cannot palpate any obvious fluid wave.  There is no tenderness to palpation.  There is no abdominal mass.  There is no palpable liver or spleen tip.  Extremity shows the mild atrophy bilaterally.  Neurological exam is nonfocal.  Again, as seen by Ms. wire is improving slowly but surely.  Hopefully, she will be able to work with physical therapy today.  We will have to see what can be decided as to her discharge with respect to rehab.  I do appreciate the incredible care that she is getting from everybody upon [address].   Lattie Haw, MD  Maria Romero 1:37

## 2021-12-31 NOTE — TOC Progression Note (Addendum)
Transition of Care [facility]) - Progression Note    Patient Details  Name: Maria Romero MRN: [id number] Date of Birth: Sep 01, 1950  Transition of Care [facility]) CM/SW [place], RN Phone Number: 12/31/2021, 11:24 AM  Clinical Narrative:    - 4:20p Patient choice Maria Romero, bed will be available on Monday per [place] with [employer]. Insurance Josem Kaufmann has been initiated.  TOC will continue to follow.  Spoke with patient's daughter Maria Romero regarding SNF choice.Patient's family is requesting SNF that are pending or have declined. This RNCM reached out to [place] in [employer], awaiting response. [place]/ [place], [place] declined.  TOC will continue to follow.    Expected Discharge Plan: [place] Barriers to Discharge: Continued Medical Work up  Expected Discharge Plan and Services Expected Discharge Plan: [place] In-house Referral: NA Discharge Planning Services: CM Consult Post Acute Care Choice: [place] arrangements for the past 2 months: Single Family Home                 DME Arranged: N/A DME Agency: NA       HH Arranged: PT HH Agency: [place] Date [place]: 12/22/21 Time [place]: [id number] Representative spoke with at [place]: [place] Determinants of Health ([place]) Interventions    Readmission Risk Interventions     No data to display

## 2022-01-01 ENCOUNTER — Inpatient Hospital Stay (HOSPITAL_COMMUNITY): Payer: Medicare Other

## 2022-01-01 DIAGNOSIS — C786 Secondary malignant neoplasm of retroperitoneum and peritoneum: Secondary | ICD-10-CM | POA: Diagnosis not present

## 2022-01-01 DIAGNOSIS — I499 Cardiac arrhythmia, unspecified: Secondary | ICD-10-CM | POA: Diagnosis not present

## 2022-01-01 DIAGNOSIS — N179 Acute kidney failure, unspecified: Secondary | ICD-10-CM | POA: Diagnosis not present

## 2022-01-01 DIAGNOSIS — E876 Hypokalemia: Secondary | ICD-10-CM | POA: Diagnosis not present

## 2022-01-01 DIAGNOSIS — E860 Dehydration: Secondary | ICD-10-CM | POA: Diagnosis not present

## 2022-01-01 DIAGNOSIS — D701 Agranulocytosis secondary to cancer chemotherapy: Secondary | ICD-10-CM | POA: Diagnosis not present

## 2022-01-01 DIAGNOSIS — D709 Neutropenia, unspecified: Secondary | ICD-10-CM | POA: Diagnosis not present

## 2022-01-01 DIAGNOSIS — I10 Essential (primary) hypertension: Secondary | ICD-10-CM | POA: Diagnosis not present

## 2022-01-01 DIAGNOSIS — R197 Diarrhea, unspecified: Secondary | ICD-10-CM | POA: Diagnosis not present

## 2022-01-01 DIAGNOSIS — C561 Malignant neoplasm of right ovary: Secondary | ICD-10-CM | POA: Diagnosis not present

## 2022-01-01 DIAGNOSIS — Z5189 Encounter for other specified aftercare: Secondary | ICD-10-CM | POA: Diagnosis not present

## 2022-01-01 DIAGNOSIS — J9601 Acute respiratory failure with hypoxia: Secondary | ICD-10-CM | POA: Diagnosis not present

## 2022-01-01 LAB — COMPREHENSIVE METABOLIC PANEL
ALT: 20 U/L (ref 0–44)
AST: 24 U/L (ref 15–41)
Albumin: 1.8 g/dL — ABNORMAL LOW (ref 3.5–5.0)
Alkaline Phosphatase: 200 U/L — ABNORMAL HIGH (ref 38–126)
Anion gap: 5 (ref 5–15)
BUN: 18 mg/dL (ref 8–23)
CO2: 24 mmol/L (ref 22–32)
Calcium: 8.2 mg/dL — ABNORMAL LOW (ref 8.9–10.3)
Chloride: 111 mmol/L (ref 98–111)
Creatinine, Ser: 0.85 mg/dL (ref 0.44–1.00)
GFR, Estimated: >60 mL/min (ref >60)
Glucose, Bld: 89 mg/dL (ref 70–99)
Potassium: 4.3 mmol/L (ref 3.5–5.1)
Sodium: 140 mmol/L (ref 135–145)
Total Bilirubin: 0.4 mg/dL (ref 0.3–1.2)
Total Protein: 5.2 g/dL — ABNORMAL LOW (ref 6.5–8.1)

## 2022-01-01 LAB — CBC WITH DIFFERENTIAL/PLATELET
Abs Immature Granulocytes: 1.33 10*3/uL — ABNORMAL HIGH (ref 0.00–0.07)
Basophils Absolute: 0.2 10*3/uL — ABNORMAL HIGH (ref 0.0–0.1)
Basophils Relative: 1 %
Eosinophils Absolute: 0 10*3/uL (ref 0.0–0.5)
Eosinophils Relative: 0 %
HCT: 32.6 % — ABNORMAL LOW (ref 36.0–46.0)
Hemoglobin: 10.4 g/dL — ABNORMAL LOW (ref 12.0–15.0)
Immature Granulocytes: 5 %
Lymphocytes Relative: 8 %
Lymphs Abs: 2.2 10*3/uL (ref 0.7–4.0)
MCH: 28.7 pg (ref 26.0–34.0)
MCHC: 31.9 g/dL (ref 30.0–36.0)
MCV: 89.8 fL (ref 80.0–100.0)
Monocytes Absolute: 1.3 10*3/uL — ABNORMAL HIGH (ref 0.1–1.0)
Monocytes Relative: 5 %
Neutro Abs: 23.9 10*3/uL — ABNORMAL HIGH (ref 1.7–7.7)
Neutrophils Relative %: 81 %
Platelets: 518 10*3/uL — ABNORMAL HIGH (ref 150–400)
RBC: 3.63 MIL/uL — ABNORMAL LOW (ref 3.87–5.11)
RDW: 16.4 % — ABNORMAL HIGH (ref 11.5–15.5)
WBC: 28.9 10*3/uL — ABNORMAL HIGH (ref 4.0–10.5)
nRBC: 0.1 % (ref 0.0–0.2)

## 2022-01-01 LAB — MAGNESIUM: Magnesium: 2 mg/dL (ref 1.7–2.4)

## 2022-01-01 NOTE — Progress Notes (Signed)
PROGRESS NOTE    [name]  ELF:[id number] DOB: 02-14-1951 DOA: 12/15/2021 PCP: Hali Marry, MD    Chief Complaint  Patient presents with   Weakness    Brief Narrative:   Maria Romero is a 71 y.o. female with past medical history significant for serous adenocarcinoma right ovary stage IIIc on neoadjuvant chemotherapy requiring recurrent paracentesis, essential hypertension who presented to [facility] ED on 9/6 by discretion of her oncologist for progressive shortness of breath, abdominal distention, diarrhea and generalized weakness.  Patient recently started on chemotherapy with carboplatinum/Taxol on 8/29 and subsequent 3 days later she began to have persistent diarrhea with decreased oral intake.  Patient denied any nausea/vomiting and no fever/chills.  She was seen in the oncology office on 9/5 and given IV fluids as well as Neulasta for white cell count of 800.  She returned to the oncology office due to continued symptoms and generalized weakness.  She was then advised to present to the ED for further evaluation.  Patient was transferred to [facility] under the hospitalist service. Therapy evaluations recommending SNF.   Assessment & Plan:   Principal Problem:   Neutropenia ([place]) Active Problems:   Essential hypertension   Malignant ascites   [name], right (HCC)   Hyponatremia   Hyperkalemia   AKI (acute kidney injury) ([place])   Pleural effusion on left   Acute respiratory failure with hypoxia (HCC)   Diarrhea   Malnutrition of moderate degree   Orthostatic hypotension   Acute respiratory failure with hypoxia secondary to pleural effusions. Chest x-ray on admission shows moderate pleural bilateral pleural effusions. Underwent IR guided left thoracentesis on 9/14 and 800 mL of fluid removed.   She also went left thoracentesis on 9/15 and 400 mL of fluid removed. Continue with Lasix 20 mg BID. Repeat CXR ordered today by oncology.   Nasal cannula oxygen to keep sats greater than 90%. Pt denies any sob, she reports feeling much better today. She has been weaned off oxygen.  Therapy evaluations recommending SNF.  Currently waiting for SNF placement. Patient is medically stable for discharge.   Acute renal failure Resolved Creatinine at 1.65 on admission . Creatinine back to baseline now with IV fluids.     Hyperkalemia Resolved with Lokelma. Potassium of 4.3.   Right stage IIIc ovarian cancer Patient completed first session of neoadjuvant chemotherapy on 12/07/2021. Patient very deconditioned and overall poor prognosis Patient following with oncology. Recommend outpatient follow up with Dr Marin Olp for further chemotherapy.     Malignant ascites Patient underwent paracentesis on 12/16/2021 and 5 L of fluid removed. Patient underwent paracentesis on 9/13 and 4 L of fluid removed Patient underwent paracentesis on 9/20  and a total of approximately 1.3 L of clear yellow fluid was removed. Repeat paracentesis ordered by oncology today.     Hypokalemia and Hypomagnesemia:  Replaced. Repeat levels wnl.    Viral lip lesion - improving.  Acyclovir ointment.    Orthostatic Hypotension:  Continue with Midodrine.  Resolved.    Acute Urinary Retention:  Resolved.    ANEMIA of chronic disease:  S/p 1 prbc transfusion on 9/14.  Transfuse to keep hemoglobin greater than 7. Repeat hemoglobin stable at 10.    GERD Continue with PPI.    Depression:  Continue with venlafaxine    Hyperlipidemia:  Continue with lipitor   Neutropenia resolved.  Patient received Neulasta in oncology clinic , would explain, the  Leukocytosis and thrombocytosis  Weakness and deconditioning / Debility:  Therapy eval recommending SNF placement.  She is medically stable for discharge.   Moderate protein calorie malnutrition.  On Marinol for improving appetite.    Diarrhea  Sec to chemo.  C diff and GI pcr is  NEG. Lomotil as needed.  Supportive care.    DVT prophylaxis: Lovenox.  Code Status: full code.  Family Communication: none at bedside.  Disposition:   Status is: Inpatient Remains inpatient appropriate because:  SNF placement.    Level of care: Med-Surg Consultants:  Oncology.   Procedures: none.   Antimicrobials: none.    Subjective No new complaints.  Objective: Vitals:   12/31/21 0500 12/31/21 1326 12/31/21 1957 01/01/22 0426  BP:  123/79 115/82 (!) 147/88  Pulse: 97 99 81 (!) 103  Resp:  '16 16 16  '$ Temp:  97.9 F (36.6 C) (!) 97.4 F (36.3 C) 97.9 F (36.6 C)  TempSrc:  Oral Oral   SpO2:  93% 96% 95%  Weight:      Height:        Intake/Output Summary (Last 24 hours) at 01/01/2022 1347 Last data filed at 01/01/2022 0000 Gross per 24 hour  Intake --  Output 100 ml  Net -100 ml    Filed Weights   12/15/21 2000 12/25/21 1000 12/25/21 1800  Weight: 69.5 kg 60.6 kg 60.6 kg    Examination:  General exam: Appears calm and comfortable  Respiratory system: Clear to auscultation. Respiratory effort normal. Cardiovascular system: S1 & S2 heard, RRR. No pedal edema. Gastrointestinal system: Abdomen is nondistended, soft and nontender.  Central nervous system: Alert and oriented. No focal neurological deficits. Extremities: Symmetric 5 x 5 power. Skin: No rashes,  Psychiatry: Mood & affect appropriate.        Data Reviewed: I have personally reviewed following labs and imaging studies  CBC: Recent Labs  Lab 12/28/21 0410 12/29/21 0406 12/30/21 0403 12/31/21 0258 01/01/22 0329  WBC 35.8* 30.6* 34.2* 36.4* 28.9*  NEUTROABS 30.1* 25.3* 28.9* 31.2* 23.9*  HGB 10.6* 10.8* 10.8* 10.4* 10.4*  HCT 33.8* 33.7* 33.6* 32.6* 32.6*  MCV 89.4 88.9 89.6 89.8 89.8  PLT 379 421* 494* 504* 518*     Basic Metabolic Panel: Recent Labs  Lab 12/28/21 0410 12/29/21 0406 12/30/21 0403 12/31/21 0258 01/01/22 0329  NA 137 139 138 139 140  K 3.6 3.1* 3.0* 3.5  4.3  CL 109 108 108 110 111  CO2 21* 21* '22 22 24  '$ GLUCOSE 113* 96 119* 116* 89  BUN 31* 27* 26* 21 18  CREATININE 1.13* 0.95 0.92 0.80 0.85  CALCIUM 8.7* 8.0* 7.9* 7.9* 8.2*  MG  --  1.3* 2.1 1.6* 2.0     GFR: Estimated Creatinine Clearance: 49.5 mL/min (by C-G formula based on SCr of 0.85 mg/dL).  Liver Function Tests: Recent Labs  Lab 12/28/21 0410 12/29/21 0406 12/30/21 0403 12/31/21 0258 01/01/22 0329  AST '24 20 25 22 24  '$ ALT '19 15 20 19 20  '$ ALKPHOS 257* 220* 226* 211* 200*  BILITOT 0.4 0.3 0.4 0.4 0.4  PROT 4.8* 4.8* 5.0* 5.3* 5.2*  ALBUMIN 1.7* 1.6* 1.7* 1.7* 1.8*     CBG: No results for input(s): "GLUCAP" in the last 168 hours.   Recent Results (from the past 240 hour(s))  Remove urinary catheter to obtain Straight Cath urine culture     Status: Abnormal   Collection Time: 12/25/21  6:00 PM   Specimen: In/Out Cath Urine  Result Value Ref Range  Status   Specimen Description   Final    IN/OUT CATH URINE Performed at [facility] [address], [place], [place] [address]    Special Requests   Final    Immunocompromised Performed at [facility], [place] [address], [place], [place] [address]    Culture MULTIPLE SPECIES PRESENT, SUGGEST RECOLLECTION (A)  Final   Report Status 12/27/2021 FINAL  Final         Radiology Studies: No results found.      Scheduled Meds:  antiseptic oral rinse  15 mL Mouth Rinse Q4H   Chlorhexidine Gluconate Cloth  6 each Topical q morning   dronabinol  2.5 mg Oral BID AC   enoxaparin (LOVENOX) injection  40 mg Subcutaneous Q24H   feeding supplement  1 Container Oral TID BM   feeding supplement  237 mL Oral Q24H   fiber supplement (BANATROL TF)  60 mL Oral BID   furosemide  20 mg Oral Daily   megestrol  400 mg Oral BID   midodrine  10 mg Oral TID WC   mirtazapine  15 mg Oral QHS   multivitamin with minerals  1 tablet Oral Daily   pantoprazole  40 mg Oral BID   venlafaxine   37.5 mg Oral BID WC   Continuous Infusions:     LOS: 16 days        Hosie Poisson, MD [facility]   To contact the attending provider between 7A-7P or the covering provider during after hours 7P-7A, please log into the web site [email or web address] and access using universal [place] password for that web site. If you do not have the password, please call the hospital operator.  01/01/2022, 1:47 PM

## 2022-01-01 NOTE — Progress Notes (Signed)
Looks like Ms. Strebel might be going to rehab on Monday.  I am happy about this.  She is getting stronger.  She walked a little bit yesterday.  I know that physical therapy is working hard with her.  She is eating better.  Maybe the combination of Megace and Marinol are helping with her appetite.  I think it would be wise to try to get some more fluid out of her abdomen.  It is hard to say how much fluid is in there.  I also think another chest x-ray would not be a bad idea.  As far as further therapy, I really think we can be a little bit more aggressive.  I probably would give her single agent chemotherapy with carboplatinum.  I would also use Avastin with this because of the carcinomatosis that she has.  I think also we can use a PARP inhibitor as she does have a positive HRD.  I probably would not give treatment for about a week or so.  I am still not know why is taking over a week to get back the CA-125.  This seems a little bit excessively long.  She does not have any pain.  She is not having any obvious diarrhea.  She is having no problems with cough or shortness of breath.  There is no bleeding.  Her vital signs show temperature of 97.9.  Pulse 103.  Blood pressure 147/88.  Her head neck exam shows a healing herpetic lesion on the upper lip.  She has no intraoral lesions.  Lungs are clear bilaterally.  Cardiac exam regular rate and rhythm.  I do not hear any extra beats.  Abdomen is soft.  I really cannot detect any obvious fluid wave.  There is no guarding or rebound tenderness.  Extremities shows some symmetric muscle atrophy in upper and lower extremities.  Neurological exam is nonfocal.  Again, Ms. Lomeli will hopefully get to rehab on Monday.  Her [birth year] is on [birth year].  It would be nice if she was out of the hospital for her birthday.  Again, I do think a another paracentesis, or at least attempt at paracentesis, would be reasonable.  I would like to have her abdomen as drained  as possible before she goes home.  I also think that a chest x-ray would not be a bad idea.  I know that she is gotten incredible care from the wonderful staff upon [address] [facility].  I appreciate all of their compassion and their professional approach.   Lattie Haw, MD  [place] 5:16

## 2022-01-02 ENCOUNTER — Inpatient Hospital Stay (HOSPITAL_COMMUNITY): Payer: Medicare Other

## 2022-01-02 DIAGNOSIS — J9601 Acute respiratory failure with hypoxia: Secondary | ICD-10-CM | POA: Diagnosis not present

## 2022-01-02 DIAGNOSIS — E860 Dehydration: Secondary | ICD-10-CM | POA: Diagnosis not present

## 2022-01-02 DIAGNOSIS — D701 Agranulocytosis secondary to cancer chemotherapy: Secondary | ICD-10-CM | POA: Diagnosis not present

## 2022-01-02 DIAGNOSIS — N179 Acute kidney failure, unspecified: Secondary | ICD-10-CM | POA: Diagnosis not present

## 2022-01-02 LAB — CBC WITH DIFFERENTIAL/PLATELET
Abs Immature Granulocytes: 1.01 10*3/uL — ABNORMAL HIGH (ref 0.00–0.07)
Basophils Absolute: 0.1 10*3/uL (ref 0.0–0.1)
Basophils Relative: 1 %
Eosinophils Absolute: 0 10*3/uL (ref 0.0–0.5)
Eosinophils Relative: 0 %
HCT: 32.7 % — ABNORMAL LOW (ref 36.0–46.0)
Hemoglobin: 10.1 g/dL — ABNORMAL LOW (ref 12.0–15.0)
Immature Granulocytes: 4 %
Lymphocytes Relative: 7 %
Lymphs Abs: 1.9 10*3/uL (ref 0.7–4.0)
MCH: 27.9 pg (ref 26.0–34.0)
MCHC: 30.9 g/dL (ref 30.0–36.0)
MCV: 90.3 fL (ref 80.0–100.0)
Monocytes Absolute: 1.4 10*3/uL — ABNORMAL HIGH (ref 0.1–1.0)
Monocytes Relative: 5 %
Neutro Abs: 23.7 10*3/uL — ABNORMAL HIGH (ref 1.7–7.7)
Neutrophils Relative %: 83 %
Platelets: 543 10*3/uL — ABNORMAL HIGH (ref 150–400)
RBC: 3.62 MIL/uL — ABNORMAL LOW (ref 3.87–5.11)
RDW: 16.7 % — ABNORMAL HIGH (ref 11.5–15.5)
WBC: 28.1 10*3/uL — ABNORMAL HIGH (ref 4.0–10.5)
nRBC: 0 % (ref 0.0–0.2)

## 2022-01-02 LAB — COMPREHENSIVE METABOLIC PANEL
ALT: 19 U/L (ref 0–44)
AST: 20 U/L (ref 15–41)
Albumin: 1.7 g/dL — ABNORMAL LOW (ref 3.5–5.0)
Alkaline Phosphatase: 191 U/L — ABNORMAL HIGH (ref 38–126)
Anion gap: 7 (ref 5–15)
BUN: 19 mg/dL (ref 8–23)
CO2: 24 mmol/L (ref 22–32)
Calcium: 8.2 mg/dL — ABNORMAL LOW (ref 8.9–10.3)
Chloride: 107 mmol/L (ref 98–111)
Creatinine, Ser: 0.68 mg/dL (ref 0.44–1.00)
GFR, Estimated: >60 mL/min (ref >60)
Glucose, Bld: 94 mg/dL (ref 70–99)
Potassium: 3.7 mmol/L (ref 3.5–5.1)
Sodium: 138 mmol/L (ref 135–145)
Total Bilirubin: 0.4 mg/dL (ref 0.3–1.2)
Total Protein: 5.3 g/dL — ABNORMAL LOW (ref 6.5–8.1)

## 2022-01-02 MED ORDER — LIDOCAINE HCL 1 % IJ SOLN
INTRAMUSCULAR | Status: AC
  Filled 2022-01-02: qty 20

## 2022-01-02 NOTE — TOC Progression Note (Addendum)
Transition of Care [facility]) - Progression Note    Patient Details  Name: MAURINE MOWBRAY MRN: [id number] Date of Birth: 06-Jul-1950  Transition of Care [facility]) CM/SW Contact  Ross Ludwig, [place] Phone Number: 01/02/2022, 2:27 PM  Clinical Narrative:     Patient has been approved by insurance for [facility] SNF placement.  Reference number [id number] valid till 9/26, Per [employer] patient can come on Monday if medically ready for discharge.  CSW updated attending physician and SNF.  Expected Discharge Plan: [place] Barriers to Discharge: Continued Medical Work up  Expected Discharge Plan and Services Expected Discharge Plan: [place] In-house Referral: NA Discharge Planning Services: CM Consult Post Acute Care Choice: [place] arrangements for the past 2 months: Single Family Home                 DME Arranged: N/A DME Agency: NA       HH Arranged: PT HH Agency: [place] Date [place]: 12/22/21 Time [place]: 1975 Representative spoke with at [place]: [place] ([place]) Interventions    Readmission Risk Interventions    01/02/2022    2:09 PM  Readmission Risk Prevention Plan  Transportation Screening Complete  PCP or Specialist Appt within 3-5 Days Complete  HRI or [place] Complete  Social Work Consult for [place] Planning/Counseling Complete  Palliative Care Screening Complete  Medication Review Press photographer) Complete

## 2022-01-02 NOTE — Procedures (Signed)
Ultrasound-guided therapeutic paracentesis performed yielding 1.6  liters of yellow fluid. No immediate complications. EBL none. Ascites is multiloculated.

## 2022-01-02 NOTE — Progress Notes (Signed)
PROGRESS NOTE    Maria Romero  LPF:[id number] DOB: [birth year] DOA: 12/15/2021 PCP: Hali Marry, MD    Chief Complaint  Patient presents with   Weakness    Brief Narrative:   Maria Romero is a 71 y.o. 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  Patient recently started on chemotherapy with carboplatinum/Taxol on 8/29 and subsequent 3 days later she began to have persistent diarrhea with decreased oral intake.  Patient denied any nausea/vomiting and no fever/chills.  She was seen in the oncology office on 9/5 and given IV fluids as well as Neulasta for white cell count of 800.  She returned to the oncology office due to continued symptoms and generalized weakness.  She was then advised to present to the ED for further evaluation.  Patient was transferred to [facility] under the hospitalist service. Therapy evaluations recommending SNF.   Assessment & Plan:   Principal Problem:   Neutropenia ([place]) Active Problems:   Essential hypertension   Malignant ascites   [name], right (HCC)   Hyponatremia   Hyperkalemia   AKI (acute kidney injury) ([place])   Pleural effusion on left   Acute respiratory failure with hypoxia (HCC)   Diarrhea   Malnutrition of moderate degree   Orthostatic hypotension   Acute respiratory failure with hypoxia secondary to pleural effusions. Chest x-ray on admission shows moderate pleural bilateral pleural effusions. Underwent IR guided left thoracentesis on 9/14 and 800 mL of fluid removed.   She also went left thoracentesis on 9/15 and 400 mL of fluid removed. Continue with Lasix 20 mg BID. Repeat CXR ordered today by oncology.   Nasal cannula oxygen to keep sats greater than 90%. Pt denies any sob, she reports feeling much better today. She has been weaned off oxygen.  Therapy evaluations recommending SNF.  Currently waiting for SNF placement. Patient is medically stable for discharge.   Acute renal failure Resolved Creatinine at 1.65 on admission . Creatinine back to baseline now with IV fluids.     Hyperkalemia Resolved with Lokelma. Potassium of 4.3.   Right stage IIIc ovarian cancer Patient completed first session of neoadjuvant chemotherapy on 12/07/2021. Patient very deconditioned and overall poor prognosis Patient following with oncology. Recommend outpatient follow up with Dr Marin Olp for further chemotherapy.     Malignant ascites Patient underwent paracentesis on 12/16/2021 and 5 L of fluid removed. Patient underwent paracentesis on 9/13 and 4 L of fluid removed Patient underwent paracentesis on 9/20  and a total of approximately 1.3 L of clear yellow fluid was removed. Repeat paracentesis ordered by oncology , and 1.6 lit of fluid taken out.     Hypokalemia and Hypomagnesemia:  Replaced. Repeat levels wnl.    Viral lip lesion - improving.  Acyclovir ointment.    Orthostatic Hypotension:  Continue with Midodrine.  Resolved.    Acute Urinary Retention:  Resolved.    ANEMIA of chronic disease:  S/p 1 prbc transfusion on 9/14.  Transfuse to keep hemoglobin greater than 7. Repeat hemoglobin stable at 10.    GERD Continue with PPI.    Depression:  Continue with venlafaxine    Hyperlipidemia:  Continue with lipitor   Neutropenia resolved.  Patient received Neulasta in oncology clinic , would explain, the  Leukocytosis and thrombocytosis    Weakness and deconditioning / Debility:  Therapy eval recommending SNF placement.  She is medically stable for discharge.   Moderate protein calorie malnutrition.  On Marinol for improving appetite.    Diarrhea  Sec to  chemo.  C diff and GI pcr is NEG. Lomotil as needed.  Supportive care.    DVT prophylaxis: Lovenox.  Code Status: full code.  Family Communication: none at bedside.  Disposition:   Status is: Inpatient Remains inpatient appropriate because:  SNF placement.    Level of care: Med-Surg Consultants:  Oncology.   Procedures: none.   Antimicrobials: none.    Subjective No new complaints.  Objective: Vitals:   01/02/22 0900 01/02/22 0913 01/02/22 0936 01/02/22 1809  BP: (!) 140/87 119/88 118/81 136/82  Pulse:    82  Resp:    16  Temp:    (!) 97.5 F (36.4 C)  TempSrc:    Oral  SpO2:    92%  Weight:      Height:        Intake/Output Summary (Last 24 hours) at 01/02/2022 1829 Last data filed at 01/01/2022 1956 Gross per 24 hour  Intake 240 ml  Output --  Net 240 ml    Filed Weights   12/15/21 2000 12/25/21 1000 12/25/21 1800  Weight: 69.5 kg 60.6 kg 60.6 kg    Examination:  General exam: Appears calm and comfortable  Respiratory system: Clear to auscultation. Respiratory effort normal. Cardiovascular system: S1 & S2 heard, RRR. No JVD,  No pedal edema. Gastrointestinal system: Abdomen is soft, non tender.  Central nervous system: Alert and oriented. No focal neurological deficits. Extremities: Symmetric 5 x 5 power. Skin: No rashes, lesions or ulcers Psychiatry: Mood & affect appropriate.         Data Reviewed: I have personally reviewed following labs and imaging studies  CBC: Recent Labs  Lab 12/29/21 0406 12/30/21 0403 12/31/21 0258 01/01/22 0329 01/02/22 0400  WBC 30.6* 34.2* 36.4* 28.9* 28.1*  NEUTROABS 25.3* 28.9* 31.2* 23.9* 23.7*  HGB 10.8* 10.8* 10.4* 10.4* 10.1*  HCT 33.7* 33.6* 32.6* 32.6* 32.7*  MCV 88.9 89.6 89.8 89.8 90.3  PLT 421* 494* 504* 518* 543*     Basic Metabolic Panel: Recent Labs  Lab 12/29/21 0406 12/30/21 0403 12/31/21 0258 01/01/22 0329 01/02/22 0400  NA 139 138 139 140 138  K 3.1* 3.0* 3.5 4.3 3.7  CL  108 108 110 111 107  CO2 21* '22 22 24 24  '$ GLUCOSE 96 119* 116* 89 94  BUN 27* 26* '21 18 19  '$ CREATININE 0.95 0.92 0.80 0.85 0.68  CALCIUM 8.0* 7.9* 7.9* 8.2* 8.2*  MG 1.3* 2.1 1.6* 2.0  --      GFR: Estimated Creatinine Clearance: 52.6 mL/min (by C-G formula based on SCr of 0.68 mg/dL).  Liver Function Tests: Recent Labs  Lab 12/29/21 0406 12/30/21 0403 12/31/21 0258 01/01/22 0329 01/02/22 0400  AST '20 25 22 24 20  '$ ALT '15 20 19 20 19  '$ ALKPHOS 220* 226* 211* 200* 191*  BILITOT 0.3 0.4 0.4 0.4 0.4  PROT 4.8* 5.0* 5.3* 5.2* 5.3*  ALBUMIN 1.6* 1.7* 1.7* 1.8* 1.7*     CBG: No results for input(s): "GLUCAP" in the last 168 hours.   Recent Results (from the past 240 hour(s))  Remove urinary catheter to obtain Straight Cath urine culture     Status: Abnormal   Collection Time: 12/25/21  6:00 PM   Specimen: In/Out Cath Urine  Result Value Ref Range Status   Specimen Description   Final    IN/OUT CATH URINE Performed at [facility] [address] [place], [place] [address]    Special Requests   Final    Immunocompromised Performed at [facility], [place] [address] [place], [place] [address]    Culture MULTIPLE SPECIES PRESENT, SUGGEST RECOLLECTION (A)  Final   Report Status 12/27/2021 FINAL  Final         Radiology Studies: US Paracentesis  Result Date: 01/02/2022 INDICATION: Patient with history of ovarian cancer, recurrent ascites, pleural effusions. Request received for therapeutic paracentesis. EXAM: ULTRASOUND GUIDED THERAPEUTIC PARACENTESIS MEDICATIONS: 8 mL 1% lidocaine COMPLICATIONS: None immediate. PROCEDURE: Informed written consent was obtained from the patient after a discussion of the risks, benefits and alternatives to treatment. A timeout was performed prior to the initiation of the procedure. Initial ultrasound scanning demonstrates a large amount of ascites within the right mid to lower abdominal quadrant. The  right mid to lower abdomen was prepped and draped in the usual sterile fashion. 1% lidocaine was used for local anesthesia. Following this, a 19 gauge, 10-cm, Yueh catheter was introduced. An ultrasound image was saved for documentation purposes. The paracentesis was performed. The catheter was removed and a dressing was applied. The patient tolerated the procedure well without immediate post procedural complication. FINDINGS: A total of approximately 1.6 liters of yellow fluid was removed. The ascites is multiloculated. IMPRESSION: Successful ultrasound-guided therapeutic paracentesis yielding 1.6 liters of peritoneal fluid. Read by: Rowe Robert, PA-C Electronically Signed   By: Jacqulynn Cadet M.D.   On: 01/02/2022 12:43   DG Chest 2 View  Result Date: 01/01/2022 CLINICAL DATA:  Known left pleural effusion. EXAM: CHEST - 2 VIEW COMPARISON:  December 27, 2021 FINDINGS: Injectable port in stable position. Cardiomediastinal silhouette is normal. Mediastinal contours appear intact. Stable moderate in size left pleural effusion. Probable small right pleural effusion. Osseous structures are without acute abnormality. Soft tissues are grossly normal. IMPRESSION: 1. Stable moderate in size left pleural effusion. 2. Smaller right pleural effusion. Electronically Signed   By: Fidela Salisbury M.D.   On: 01/01/2022 17:46        Scheduled Meds:  antiseptic oral rinse  15 mL Mouth Rinse Q4H   Chlorhexidine Gluconate Cloth  6 each Topical q morning   dronabinol  2.5 mg Oral BID AC   enoxaparin (LOVENOX) injection  40 mg Subcutaneous Q24H   feeding supplement  1 Container Oral TID BM   feeding supplement  237 mL Oral Q24H   fiber supplement (BANATROL TF)  60 mL Oral BID   furosemide  20 mg Oral Daily   lidocaine       megestrol  400 mg Oral BID   midodrine  10 mg Oral TID WC   mirtazapine  15 mg Oral QHS   multivitamin with minerals  1 tablet Oral Daily   pantoprazole  40 mg Oral BID   venlafaxine   37.5 mg Oral BID WC   Continuous Infusions:     LOS: 17 days        Hosie Poisson, MD [facility]   To contact the attending provider between 7A-7P or the covering provider during after hours 7P-7A, please log into the web site [email or web address] and access using universal [place] password for that web site. If you do not have the password, please call the hospital operator.  01/02/2022, 6:29 PM

## 2022-01-03 DIAGNOSIS — Z743 Need for continuous supervision: Secondary | ICD-10-CM | POA: Diagnosis not present

## 2022-01-03 DIAGNOSIS — L89152 Pressure ulcer of sacral region, stage 2: Secondary | ICD-10-CM | POA: Diagnosis not present

## 2022-01-03 DIAGNOSIS — R188 Other ascites: Secondary | ICD-10-CM | POA: Diagnosis not present

## 2022-01-03 DIAGNOSIS — E871 Hypo-osmolality and hyponatremia: Secondary | ICD-10-CM | POA: Diagnosis not present

## 2022-01-03 DIAGNOSIS — I951 Orthostatic hypotension: Secondary | ICD-10-CM | POA: Diagnosis not present

## 2022-01-03 DIAGNOSIS — C569 Malignant neoplasm of unspecified ovary: Secondary | ICD-10-CM | POA: Diagnosis not present

## 2022-01-03 DIAGNOSIS — Z5111 Encounter for antineoplastic chemotherapy: Secondary | ICD-10-CM | POA: Diagnosis not present

## 2022-01-03 DIAGNOSIS — L89322 Pressure ulcer of left buttock, stage 2: Secondary | ICD-10-CM | POA: Diagnosis not present

## 2022-01-03 DIAGNOSIS — Z95828 Presence of other vascular implants and grafts: Secondary | ICD-10-CM | POA: Diagnosis not present

## 2022-01-03 DIAGNOSIS — C561 Malignant neoplasm of right ovary: Secondary | ICD-10-CM | POA: Diagnosis present

## 2022-01-03 DIAGNOSIS — R180 Malignant ascites: Secondary | ICD-10-CM | POA: Diagnosis not present

## 2022-01-03 DIAGNOSIS — N179 Acute kidney failure, unspecified: Secondary | ICD-10-CM | POA: Diagnosis not present

## 2022-01-03 DIAGNOSIS — E440 Moderate protein-calorie malnutrition: Secondary | ICD-10-CM | POA: Diagnosis not present

## 2022-01-03 DIAGNOSIS — M6281 Muscle weakness (generalized): Secondary | ICD-10-CM | POA: Diagnosis not present

## 2022-01-03 DIAGNOSIS — J9601 Acute respiratory failure with hypoxia: Secondary | ICD-10-CM | POA: Diagnosis not present

## 2022-01-03 DIAGNOSIS — D701 Agranulocytosis secondary to cancer chemotherapy: Secondary | ICD-10-CM | POA: Diagnosis not present

## 2022-01-03 DIAGNOSIS — E785 Hyperlipidemia, unspecified: Secondary | ICD-10-CM | POA: Diagnosis not present

## 2022-01-03 DIAGNOSIS — R531 Weakness: Secondary | ICD-10-CM | POA: Diagnosis not present

## 2022-01-03 DIAGNOSIS — J90 Pleural effusion, not elsewhere classified: Secondary | ICD-10-CM | POA: Diagnosis not present

## 2022-01-03 DIAGNOSIS — R1312 Dysphagia, oropharyngeal phase: Secondary | ICD-10-CM | POA: Diagnosis not present

## 2022-01-03 DIAGNOSIS — D709 Neutropenia, unspecified: Secondary | ICD-10-CM | POA: Diagnosis not present

## 2022-01-03 DIAGNOSIS — M810 Age-related osteoporosis without current pathological fracture: Secondary | ICD-10-CM | POA: Diagnosis not present

## 2022-01-03 DIAGNOSIS — Z803 Family history of malignant neoplasm of breast: Secondary | ICD-10-CM | POA: Diagnosis not present

## 2022-01-03 DIAGNOSIS — E875 Hyperkalemia: Secondary | ICD-10-CM | POA: Diagnosis not present

## 2022-01-03 DIAGNOSIS — D702 Other drug-induced agranulocytosis: Secondary | ICD-10-CM | POA: Diagnosis not present

## 2022-01-03 DIAGNOSIS — K219 Gastro-esophageal reflux disease without esophagitis: Secondary | ICD-10-CM | POA: Diagnosis not present

## 2022-01-03 DIAGNOSIS — R2681 Unsteadiness on feet: Secondary | ICD-10-CM | POA: Diagnosis not present

## 2022-01-03 DIAGNOSIS — E860 Dehydration: Secondary | ICD-10-CM | POA: Diagnosis not present

## 2022-01-03 DIAGNOSIS — R609 Edema, unspecified: Secondary | ICD-10-CM | POA: Diagnosis not present

## 2022-01-03 DIAGNOSIS — R2689 Other abnormalities of gait and mobility: Secondary | ICD-10-CM | POA: Diagnosis not present

## 2022-01-03 DIAGNOSIS — E7800 Pure hypercholesterolemia, unspecified: Secondary | ICD-10-CM | POA: Diagnosis not present

## 2022-01-03 DIAGNOSIS — T451X5A Adverse effect of antineoplastic and immunosuppressive drugs, initial encounter: Secondary | ICD-10-CM | POA: Diagnosis not present

## 2022-01-03 LAB — CBC WITH DIFFERENTIAL/PLATELET
Abs Immature Granulocytes: 0.57 10*3/uL — ABNORMAL HIGH (ref 0.00–0.07)
Basophils Absolute: 0.1 10*3/uL (ref 0.0–0.1)
Basophils Relative: 0 %
Eosinophils Absolute: 0 10*3/uL (ref 0.0–0.5)
Eosinophils Relative: 0 %
HCT: 31.6 % — ABNORMAL LOW (ref 36.0–46.0)
Hemoglobin: 10 g/dL — ABNORMAL LOW (ref 12.0–15.0)
Immature Granulocytes: 2 %
Lymphocytes Relative: 8 %
Lymphs Abs: 1.9 10*3/uL (ref 0.7–4.0)
MCH: 28.6 pg (ref 26.0–34.0)
MCHC: 31.6 g/dL (ref 30.0–36.0)
MCV: 90.3 fL (ref 80.0–100.0)
Monocytes Absolute: 1.4 10*3/uL — ABNORMAL HIGH (ref 0.1–1.0)
Monocytes Relative: 6 %
Neutro Abs: 20.6 10*3/uL — ABNORMAL HIGH (ref 1.7–7.7)
Neutrophils Relative %: 84 %
Platelets: 547 10*3/uL — ABNORMAL HIGH (ref 150–400)
RBC: 3.5 MIL/uL — ABNORMAL LOW (ref 3.87–5.11)
RDW: 16.8 % — ABNORMAL HIGH (ref 11.5–15.5)
WBC: 24.6 10*3/uL — ABNORMAL HIGH (ref 4.0–10.5)
nRBC: 0 % (ref 0.0–0.2)

## 2022-01-03 LAB — COMPREHENSIVE METABOLIC PANEL
ALT: 16 U/L (ref 0–44)
AST: 20 U/L (ref 15–41)
Albumin: 1.5 g/dL — ABNORMAL LOW (ref 3.5–5.0)
Alkaline Phosphatase: 178 U/L — ABNORMAL HIGH (ref 38–126)
Anion gap: 9 (ref 5–15)
BUN: 20 mg/dL (ref 8–23)
CO2: 22 mmol/L (ref 22–32)
Calcium: 8 mg/dL — ABNORMAL LOW (ref 8.9–10.3)
Chloride: 108 mmol/L (ref 98–111)
Creatinine, Ser: 0.8 mg/dL (ref 0.44–1.00)
GFR, Estimated: >60 mL/min (ref >60)
Glucose, Bld: 135 mg/dL — ABNORMAL HIGH (ref 70–99)
Potassium: 3.3 mmol/L — ABNORMAL LOW (ref 3.5–5.1)
Sodium: 139 mmol/L (ref 135–145)
Total Bilirubin: 0.3 mg/dL (ref 0.3–1.2)
Total Protein: 4.8 g/dL — ABNORMAL LOW (ref 6.5–8.1)

## 2022-01-03 LAB — MAGNESIUM: Magnesium: 1.6 mg/dL — ABNORMAL LOW (ref 1.7–2.4)

## 2022-01-03 MED ORDER — DRONABINOL 2.5 MG PO CAPS: 2.5000 mg | ORAL_CAPSULE | Freq: Two times a day (BID) | ORAL | 0 refills | Status: AC

## 2022-01-03 MED ORDER — HEPARIN SOD (PORK) LOCK FLUSH 100 UNIT/ML IV SOLN
500.0000 [IU] | INTRAVENOUS | Status: AC | PRN
  Administered 2022-01-03: 500 [IU]

## 2022-01-03 MED ORDER — ENSURE ENLIVE PO LIQD: 237.0000 mL | ORAL | 2 refills | Status: DC

## 2022-01-03 MED ORDER — POTASSIUM CHLORIDE CRYS ER 20 MEQ PO TBCR
40.0000 meq | EXTENDED_RELEASE_TABLET | Freq: Once | ORAL | Status: AC
  Administered 2022-01-03: 40 meq via ORAL
  Filled 2022-01-03: qty 2

## 2022-01-03 MED ORDER — MIDODRINE HCL 10 MG PO TABS: 10.0000 mg | ORAL_TABLET | Freq: Three times a day (TID) | ORAL | 1 refills | Status: DC

## 2022-01-03 MED ORDER — ADULT MULTIVITAMIN W/MINERALS CH: 1.0000 | ORAL_TABLET | Freq: Every day | ORAL | Status: DC

## 2022-01-03 MED ORDER — MAGNESIUM SULFATE 2 GM/50ML IV SOLN
2.0000 g | Freq: Once | INTRAVENOUS | Status: AC
  Administered 2022-01-03: 2 g via INTRAVENOUS
  Filled 2022-01-03: qty 50

## 2022-01-03 MED ORDER — VENLAFAXINE HCL 37.5 MG PO TABS: 37.5000 mg | ORAL_TABLET | Freq: Two times a day (BID) | ORAL | 0 refills | Status: DC

## 2022-01-03 MED ORDER — MIRTAZAPINE 15 MG PO TBDP: 15.0000 mg | ORAL_TABLET | Freq: Every day | ORAL | 0 refills | Status: DC

## 2022-01-03 MED ORDER — BANATROL TF EN LIQD: 60.0000 mL | Freq: Two times a day (BID) | ENTERAL | Status: DC

## 2022-01-03 MED ORDER — MEGESTROL ACETATE 400 MG/10ML PO SUSP: 400.0000 mg | Freq: Two times a day (BID) | ORAL | 0 refills | Status: DC

## 2022-01-03 NOTE — Progress Notes (Signed)
Physical Therapy Treatment Patient Details Name: Maria Romero MRN: [id number] DOB: 1950/11/13 Today's Date: 01/03/2022   History of Present Illness Maria Romero is a 71 y.o. female admitted 9/6 who presents with increasing weakness and persistent diarrhea. could not tolerate thoracentesis Fri 9/8 due to hypotension.  PMH:  stage IIIC serous adenocarinoma of the right ovary on neoadjuvant chemotherapy requiring therapeutic paracentesis, HTN    PT Comments    Pt assisted to [facility] and then recliner.  Pt also performed a few exercises once in recliner.   Pt fatigues quickly.    Recommendations for follow up therapy are one component of a multi-disciplinary discharge planning process, led by the attending physician.  Recommendations may be updated based on patient status, additional functional criteria and insurance authorization.  Follow Up Recommendations  Skilled nursing-short term rehab (<3 hours/day) Can patient physically be transported by private vehicle: No   Assistance Recommended at Discharge Frequent or constant Supervision/Assistance  Patient can return home with the following A little help with bathing/dressing/bathroom;Assistance with cooking/housework;Direct supervision/assist for medications management;Direct supervision/assist for financial management;Assist for transportation;Help with stairs or ramp for entrance;A lot of help with walking and/or transfers   Equipment Recommendations  Other (comment) (TBD next venue)    Recommendations for Other Services       Precautions / Restrictions Precautions Precautions: Fall     Mobility  Bed Mobility Overal bed mobility: Needs Assistance Bed Mobility: Supine to Sit     Supine to sit: Min guard, HOB elevated     General bed mobility comments: increased time and effort    Transfers Overall transfer level: Needs assistance Equipment used: None Transfers: Sit to/from Stand, Bed to chair/wheelchair/BSC Sit to  Stand: Min assist Stand pivot transfers: Min assist         General transfer comment: pt requested BSC, light assist for stabilizing, bed to [facility] to recliner    Ambulation/Gait                   Stairs             Wheelchair Mobility    Modified Rankin (Stroke Patients Only)       Balance                                            Cognition Arousal/Alertness: Awake/alert Behavior During Therapy: Flat affect Overall Cognitive Status: Within Functional Limits for tasks assessed                                          Exercises General Exercises - Upper Extremity Shoulder Flexion: AROM, Both, Seated, 10 reps Shoulder ADduction: AROM, Both, 10 reps, Seated Elbow Flexion: AROM, Both, 10 reps, Seated General Exercises - Lower Extremity Heel Slides: AROM, Both, 10 reps Hip ABduction/ADduction: Supine Straight Leg Raises: AROM, Both, 10 reps, Supine    General Comments        Pertinent Vitals/Pain Pain Assessment Pain Assessment: No/denies pain    Home Living                          Prior Function            PT Goals (current goals can now be found in the  care plan section) Progress towards PT goals: Progressing toward goals    Frequency    Min 2X/week      PT Plan Current plan remains appropriate    Co-evaluation              AM-PAC PT "6 Clicks" Mobility   Outcome Measure  Help needed turning from your back to your side while in a flat bed without using bedrails?: A Little Help needed moving from lying on your back to sitting on the side of a flat bed without using bedrails?: A Little Help needed moving to and from a bed to a chair (including a wheelchair)?: A Lot Help needed standing up from a chair using your arms (e.g., wheelchair or bedside chair)?: A Lot Help needed to walk in hospital room?: A Lot Help needed climbing 3-5 steps with a railing? : Total 6 Click Score:  13    End of Session   Activity Tolerance: Patient limited by fatigue Patient left: in chair;with call bell/phone within reach;with chair alarm set Nurse Communication: Mobility status PT Visit Diagnosis: Muscle weakness (generalized) (M62.81);Difficulty in walking, not elsewhere classified (R26.2)     Time: 6767-2094 PT Time Calculation (min) (ACUTE ONLY): 12 min  Charges:  $Therapeutic Activity: 8-22 mins                     Jannette Spanner PT, DPT Physical Therapist Acute Rehabilitation Services Preferred contact method: Secure Chat Weekend Pager Only: [phone] Office: [place] 01/03/2022, 1:33 PM

## 2022-01-03 NOTE — Progress Notes (Signed)
Patient discharged to [place].  Report called to [facility]).

## 2022-01-03 NOTE — TOC Progression Note (Addendum)
Transition of Care [facility]) - Progression Note    Patient Details  Name: Maria Romero MRN: [id number] Date of Birth: 12-24-1950  Transition of Care [facility]) CM/SW [place], RN Phone Number: 01/03/2022, 9:54 AM  Clinical Narrative:   - 12:10p : Per Lexine Baton with [facility], representative Adela Lank will come to [facility] to have patient complete paperwork for discharge to SNF today.     - 10:13a TOC awaiting dc summary for SNF placement. Insurance auth expires 01/04/22.  Reached out to MD to determine if patient medically stable for discharge to SNF. Spoke with Lexine Baton at [employer] to discuss awaiting status before advising if ready to transport to SNF today.   TOC will continue to follow.     Expected Discharge Plan: [place] Barriers to Discharge: Continued Medical Work up  Expected Discharge Plan and Services Expected Discharge Plan: [place] In-house Referral: NA Discharge Planning Services: CM Consult Post Acute Care Choice: [place] arrangements for the past 2 months: Single Family Home                 DME Arranged: N/A DME Agency: NA       HH Arranged: PT HH Agency: [place] Date [place]: 12/22/21 Time [place]: [id number] Representative spoke with at [place]: [place] ([place]) Interventions    Readmission Risk Interventions    01/02/2022    2:09 PM  Readmission Risk Prevention Plan  Transportation Screening Complete  PCP or Specialist Appt within 3-5 Days Complete  HRI or [place] Complete  Social Work Consult for [place] Planning/Counseling Complete  Palliative Care Screening Complete  Medication Review Press photographer) Complete

## 2022-01-03 NOTE — Progress Notes (Signed)
Maria Romero had a fairly quiet weekend.  She had 1.6 L of fluid taken off her belly.  Chest x-ray showed a moderate but stable left pleural effusion.  She should be going to Rehab today.  I would then probably plan for follow-up chemotherapy in about a week.  She is eating a bit better.  I think the Marinol may be helping her, in addition to the Megace.  She does not complain of any diarrhea.  There is no pain.  There is no bleeding.  She has had no nausea or vomiting.  She is walking a little bit.  I still think she needs quite a bit of help with her stamina.  She has not had any lab work done yet today.  We really need to get some lab work before she leaves the hospital.  She has had no fever.  I am not sure why takes 10 days to get the CA-125 back.  Her vital signs show temperature of 98.1.  Pulse 99.  Blood pressure 116/79.  Her head neck exam shows no ocular or oral lesions.  The lesions on the upper lip are almost resolved.  Lungs sound clear bilaterally.  She has no wheezes.  Cardiac exam regular rate and rhythm with no murmurs, rubs or bruits.  Abdomen is soft.  I cannot detect a fluid wave.  There is no palpable liver or spleen tip.  There is no palpable abdominal mass.  Extremities shows no clubbing, cyanosis or edema.  Neurological exam is nonfocal.  Again, Maria Romero has a locally advanced ovarian cancer.  After 1 cycle of chemotherapy, she totally decompensated.  Is taking this long to finally get her to where she can be discharged and sent to Rehab.  For any additional treatment, we will have to do single agent chemotherapy.  I probably would add Avastin to this.  I also would add a PARP inhibitor.  I will make plans for follow-up once she is at rehab.  I do appreciate the incredible care that she received from everybody upon 4 W.  Lattie Haw, MD  Maria Romero 32:17

## 2022-01-03 NOTE — Care Management Important Message (Signed)
Important Message  Patient Details IM Letter given to the Patient. Name: Maria Romero MRN: [id number] Date of Birth: Mar 22, 1951   Medicare Important Message Given:  Yes     Kerin Salen 01/03/2022, 11:54 AM

## 2022-01-03 NOTE — Discharge Summary (Signed)
Physician Discharge Summary   Patient: Maria Romero MRN: [id number] DOB: Dec 20, 1950  Admit date:     12/15/2021  Discharge date: 01/03/22  Discharge Physician: Hosie Poisson   PCP: Hali Marry, MD   Recommendations at discharge:   Discharge Diagnoses: Principal Problem:   Neutropenia [facility]) Active Problems:   Essential hypertension   Malignant ascites   [name], right (HCC)   Hyponatremia   Hyperkalemia   AKI (acute kidney injury) ([place])   Pleural effusion on left   Acute respiratory failure with hypoxia ([place])   Diarrhea   Malnutrition of moderate degree   Orthostatic hypotension    Hospital Course: Maria Romero is a 71 y.o. 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  Patient recently started on chemotherapy with carboplatinum/Taxol on 8/29 and subsequent 3 days later she began to have persistent diarrhea with decreased oral intake.  Patient denied any nausea/vomiting and no fever/chills.  She was seen in the oncology office on 9/5 and given IV fluids as well as Neulasta for white cell count of 800.  She returned to the oncology office due to continued symptoms and generalized weakness.  She was then advised to present to the ED for further evaluation.  Patient was transferred to [facility] under the hospitalist service. Therapy evaluations recommending SNF.     Assessment and Plan: Acute respiratory failure with hypoxia secondary to pleural effusions. Chest x-ray on admission shows moderate pleural bilateral pleural effusions. Underwent IR guided left thoracentesis on 9/14 and 800 mL of fluid removed.   She also went left thoracentesis on 9/15 and  400 mL of fluid removed.  Repeat CXR ordered today by oncology.  Nasal cannula oxygen to keep sats greater than 90%. Pt denies any sob, she reports feeling much better today. She has been weaned off oxygen.  Therapy evaluations recommending SNF.  Currently waiting for SNF placement. Patient is medically stable for discharge.    Acute renal failure Resolved Creatinine at 1.65 on admission . Creatinine back to baseline now with IV fluids.        Hyperkalemia Resolved with Lokelma. Potassium of 4.3.     Right stage IIIc ovarian cancer Patient completed first session of neoadjuvant chemotherapy on 12/07/2021. Patient very deconditioned and overall poor prognosis Patient following with oncology. Recommend outpatient follow up with Dr Marin Olp for further chemotherapy.        Malignant ascites Patient underwent paracentesis on 12/16/2021 and 5 L of fluid removed. Patient underwent paracentesis on 9/13 and 4 L of fluid removed Patient underwent paracentesis on 9/20  and a total of approximately 1.3 L of clear yellow fluid was removed. Repeat paracentesis ordered by oncology , and 1.6 lit of fluid taken out.        Hypokalemia and Hypomagnesemia:  Replaced. Repeat levels wnl.       Viral lip lesion - improving.  Acyclovir ointment.      Orthostatic Hypotension:  Continue with Midodrine.  Resolved.      Acute Urinary Retention:  Resolved.      ANEMIA of chronic disease:  S/p 1 prbc transfusion on 9/14.  Transfuse to keep hemoglobin greater than 7. Repeat hemoglobin stable at 10.      GERD Continue with PPI.  Depression:  Continue with venlafaxine      Hyperlipidemia:  Continue with lipitor    Neutropenia resolved.  Patient received Neulasta in oncology clinic , would explain, the  Leukocytosis and thrombocytosis       Weakness and deconditioning / Debility:  Therapy eval recommending SNF placement.  She is medically stable for discharge.     Moderate protein calorie malnutrition.  On Marinol for improving appetite.      Diarrhea  Sec to chemo.  C diff and GI pcr is NEG. Lomotil as needed.  Supportive care.        Consultants: oncology.  Procedures performed: US PARACENTESIS  THORACENTESIS.   Disposition: Skilled nursing facility Diet recommendation:  Discharge Diet Orders (From admission, onward)     Start     Ordered   01/03/22 0000  Diet - low sodium heart healthy        01/03/22 1120           Regular diet DISCHARGE MEDICATION: Allergies as of 01/03/2022       Reactions   Fluvastatin Sodium Other (See Comments)   Myalgias        Medication List     STOP taking these medications    Lactulose 20 GM/30ML Soln   lidocaine-prilocaine cream Commonly known as: EMLA       TAKE these medications    atorvastatin 40 MG tablet Commonly known as: LIPITOR TAKE ONE TABLET BY MOUTH EVERY NIGHT AT BEDTIME   cholecalciferol 25 MCG (1000 UNIT) tablet Commonly known as: VITAMIN D3 Take 1,000 Units by mouth daily.   dexamethasone 4 MG tablet Commonly known as: DECADRON Take 2 tablets by mouth starting the day after chemotherapy for 3 days. Take with food. What changed:  how much to take how to take this when to take this additional instructions   diphenoxylate-atropine 2.5-0.025 MG tablet Commonly known as: LOMOTIL Take 1 tablet by mouth 4 (four) times daily as needed for diarrhea or loose stools.   dronabinol 2.5 MG capsule Commonly known as: MARINOL Take 1 capsule (2.5 mg total) by mouth 2 (two) times daily before lunch and supper for 3 days.   feeding supplement Liqd Take 237 mLs by mouth daily.   fiber supplement (BANATROL TF) liquid Take 60 mLs by mouth 2 (two) times daily.   Fish Oil Maximum Strength 1200 MG Caps Take 1,200 mg by mouth See admin instructions. Take 1,200 mg by mouth two times a day twice weekly   megestrol 400 MG/10ML suspension Commonly known as: MEGACE Take  10 mLs (400 mg total) by mouth 2 (two) times daily.   midodrine 10 MG tablet Commonly known as: PROAMATINE Take 1 tablet (10 mg total) by mouth 3 (three) times daily with meals.   mirtazapine 15 MG disintegrating tablet Commonly known as: REMERON SOL-TAB Take 1 tablet (15 mg total) by mouth at bedtime.   multivitamin with minerals Tabs tablet Take 1 tablet by mouth daily. Start taking on: January 04, 2022   ondansetron 8 MG tablet Commonly known as: Zofran Take 1 tablet (8 mg total) by mouth every 8 (eight) hours as needed for nausea or vomiting. Start on the third day after chemotherapy.   PriLOSEC OTC 20 MG tablet Generic drug: omeprazole Take 20 mg by mouth daily before breakfast.   prochlorperazine 10 MG tablet Commonly known as: COMPAZINE Take 1 tablet (10 mg total) by mouth every 6 (six) hours as needed for nausea or vomiting.   venlafaxine 37.5 MG  tablet Commonly known as: EFFEXOR Take 1 tablet (37.5 mg total) by mouth 2 (two) times daily with a meal.   VIACTIV PO Take by mouth See admin instructions. Chew 1 square by mouth once a day- for calcium        Follow-up Information     [facility] Follow up.   Specialty: [place] Why: A representative with [facility] will contact you within 24-48 hours from your discharge for home health physical therapy services. Contact information: [address] [place] [address] [phone]         Hali Marry, MD. Schedule an appointment as soon as possible for a visit in 1 week(s).   Specialty: Family Medicine Contact information: [address] [place] [address] [phone]                Discharge Exam: Danley Danker Weights   12/15/21 2000 12/25/21 1000 12/25/21 1800  Weight: 69.5 kg 60.6 kg 60.6 kg   General exam: Appears calm and comfortable  Respiratory system: Clear to auscultation. Respiratory effort  normal. Cardiovascular system: S1 & S2 heard, RRR. No JVD, murmurs, rubs, gallops or clicks. No pedal edema. Gastrointestinal system: Abdomen is nondistended, soft and nontender. No organomegaly or masses felt. Normal bowel sounds heard. Central nervous system: Alert and oriented. No focal neurological deficits. Extremities: Symmetric 5 x 5 power. Skin: No rashes, lesions or ulcers Psychiatry: Judgement and insight appear normal. Mood & affect appropriate.    Condition at discharge: fair  The results of significant diagnostics from this hospitalization (including imaging, microbiology, ancillary and laboratory) are listed below for reference.   Imaging Studies: US Paracentesis  Result Date: 01/02/2022 INDICATION: Patient with history of ovarian cancer, recurrent ascites, pleural effusions. Request received for therapeutic paracentesis. EXAM: ULTRASOUND GUIDED THERAPEUTIC PARACENTESIS MEDICATIONS: 8 mL 1% lidocaine COMPLICATIONS: None immediate. PROCEDURE: Informed written consent was obtained from the patient after a discussion of the risks, benefits and alternatives to treatment. A timeout was performed prior to the initiation of the procedure. Initial ultrasound scanning demonstrates a large amount of ascites within the right mid to lower abdominal quadrant. The right mid to lower abdomen was prepped and draped in the usual sterile fashion. 1% lidocaine was used for local anesthesia. Following this, a 19 gauge, 10-cm, Yueh catheter was introduced. An ultrasound image was saved for documentation purposes. The paracentesis was performed. The catheter was removed and a dressing was applied. The patient tolerated the procedure well without immediate post procedural complication. FINDINGS: A total of approximately 1.6 liters of yellow fluid was removed. The ascites is multiloculated. IMPRESSION: Successful ultrasound-guided therapeutic paracentesis yielding 1.6 liters of peritoneal fluid. Read by: Rowe Robert, PA-C Electronically Signed   By: Jacqulynn Cadet M.D.   On: 01/02/2022 12:43   DG Chest 2 View  Result Date: 01/01/2022 CLINICAL DATA:  Known left pleural effusion. EXAM: CHEST - 2 VIEW COMPARISON:  December 27, 2021 FINDINGS: Injectable port in stable position. Cardiomediastinal silhouette is normal. Mediastinal contours appear intact. Stable moderate in size left pleural effusion. Probable small right pleural effusion. Osseous structures are without acute abnormality. Soft tissues are grossly normal. IMPRESSION: 1. Stable moderate in size left pleural effusion. 2. Smaller right pleural effusion. Electronically Signed   By: Fidela Salisbury M.D.   On: 01/01/2022 17:46   US Paracentesis  Result Date: 12/28/2021 INDICATION: History of ovarian cancer with recurrent ascites. Request for diagnostic and therapeutic  PARACENTESIS. EXAM: ULTRASOUND GUIDED LEFT LOWER QUADRANT PARACENTESIS MEDICATIONS: 1% plain lidocaine, 5 mL COMPLICATIONS: None immediate. PROCEDURE: Informed written consent was obtained from the patient after a discussion of the risks, benefits and alternatives to treatment. A timeout was performed prior to the initiation of the procedure. Initial ultrasound scanning demonstrates a small amount of ascites within the left lower abdominal quadrant. The left lower abdomen was prepped and draped in the usual sterile fashion. 1% lidocaine was used for local anesthesia. Following this, a 19 gauge, 7-cm, Yueh catheter was introduced. An ultrasound image was saved for documentation purposes. The paracentesis was performed. The catheter was removed and a dressing was applied. The patient tolerated the procedure well without immediate post procedural complication. FINDINGS: A total of approximately 1.3 L of clear yellow fluid was removed. Samples were sent to the laboratory as requested by the clinical team. IMPRESSION: Successful ultrasound-guided paracentesis yielding 1.3 liters of peritoneal  fluid. Read by: Ascencion Dike PA-C Electronically Signed   By: Jerilynn Mages.  Shick M.D.   On: 12/28/2021 16:19   DG CHEST PORT 1 VIEW  Result Date: 12/27/2021 CLINICAL DATA:  Shortness of breath, hypertension EXAM: PORTABLE CHEST 1 VIEW COMPARISON:  12/24/2021 FINDINGS: Moderate left pleural effusion with left basilar atelectasis. Trace right pleural effusion. No pneumothorax. Stable cardiomediastinal silhouette. Right-sided Port-A-Cath in satisfactory position. No aggressive osseous lesion. IMPRESSION: 1. Moderate left pleural effusion with left basilar atelectasis. 2. Trace right pleural effusion. Electronically Signed   By: Kathreen Devoid M.D.   On: 12/27/2021 08:13   DG Chest Port 1 View  Result Date: 12/24/2021 CLINICAL DATA:  Status post right thoracentesis. EXAM: PORTABLE CHEST 1 VIEW COMPARISON:  Chest x-ray from yesterday. FINDINGS: Unchanged right chest wall port catheter. The heart size and mediastinal contours are within normal limits. Essentially resolved right pleural effusion status post thoracentesis. No pneumothorax. Recurrent small left pleural effusion. Improved right basilar and increased left basilar atelectasis. No acute osseous abnormality. IMPRESSION: 1. Essentially resolved right pleural effusion status post thoracentesis. No pneumothorax. 2. Recurrent small left pleural effusion. Electronically Signed   By: Titus Dubin M.D.   On: 12/24/2021 13:50   US THORACENTESIS ASP PLEURAL SPACE W/IMG GUIDE  Result Date: 12/24/2021 INDICATION: Patient with history of serous adenocarcinoma of right ovary stage III. Request for therapeutic right thoracentesis. EXAM: ULTRASOUND GUIDED THERAPEUTIC RIGHT THORACENTESIS MEDICATIONS: 10 mL 1 % lidocaine COMPLICATIONS: None immediate. PROCEDURE: An ultrasound guided thoracentesis was thoroughly discussed with the patient and questions answered. The benefits, risks, alternatives and complications were also discussed. The patient understands and wishes to  proceed with the procedure. Written consent was obtained. Ultrasound was performed to localize and mark an adequate pocket of fluid in the right chest. The area was then prepped and draped in the normal sterile fashion. 1% Lidocaine was used for local anesthesia. Under ultrasound guidance a 6 Fr Safe-T-Centesis catheter was introduced. Thoracentesis was performed. The catheter was removed and a dressing applied. FINDINGS: A total of approximately 400 cc of clear, amber fluid was removed. IMPRESSION: Successful ultrasound guided right thoracentesis yielding 400 cc of pleural fluid. Read by: Narda Rutherford, AGNP-BC Electronically Signed   By: Miachel Roux M.D.   On: 12/24/2021 13:32   US THORACENTESIS ASP PLEURAL SPACE W/IMG GUIDE  Result Date: 12/23/2021 INDICATION: Patient with history of serous adenocarcinoma of right ovary stage III. Patient was noted to have bilateral pleural effusions on imaging left greater than right. Request for diagnostic and therapeutic thoracentesis. Upon ultrasound left greater than  right. EXAM: ULTRASOUND GUIDED DIAGNOSTIC AND THERAPEUTIC LEFT THORACENTESIS MEDICATIONS: 10 mL 1 % lidocaine COMPLICATIONS: None immediate. PROCEDURE: An ultrasound guided thoracentesis was thoroughly discussed with the patient and questions answered. The benefits, risks, alternatives and complications were also discussed. The patient understands and wishes to proceed with the procedure. Written consent was obtained. Ultrasound was performed to localize and mark an adequate pocket of fluid in the LEFT chest. The area was then prepped and draped in the normal sterile fashion. 1% Lidocaine was used for local anesthesia. Under ultrasound guidance a 6 Fr Safe-T-Centesis catheter was introduced. Thoracentesis was performed. The catheter was removed and a dressing applied. FINDINGS: A total of approximately 100 mL of clear, amber fluid was removed. Samples were sent to the laboratory as requested by the clinical  team. IMPRESSION: Successful ultrasound guided LEFT thoracentesis yielding 800 mL of pleural fluid. Read by: Narda Rutherford, AGNP-BC Electronically Signed   By: Michaelle Birks M.D.   On: 12/23/2021 16:06   DG Chest Port 1 View  Result Date: 12/23/2021 CLINICAL DATA:  Post thoracentesis EXAM: PORTABLE CHEST 1 VIEW COMPARISON:  Previous studies including the examination of 12/21/2021 FINDINGS: There is marked interval decrease in left pleural effusion. There is no demonstrable left apical pneumothorax. There is questionable thin linear lucency in the medial left mid lung field. There is faint residual haziness in the left lower lung fields suggesting layering of small pleural effusion. There is a small to moderate right pleural effusion with no significant interval change. There are no signs of pulmonary edema. Right IJ chest port is noted with its tip in superior vena cava. There is moderate gaseous distention of colon. IMPRESSION: There is marked interval decrease in left pleural effusion after thoracentesis. There is no demonstrable left apical pneumothorax. There is possible tiny pneumothorax in the medial left mid lung field. No significant interval changes are noted in moderate right pleural effusion. Evaluation of right lower lung field for infiltrates is limited by the effusion. Electronically Signed   By: Elmer Picker M.D.   On: 12/23/2021 15:45   US Paracentesis  Result Date: 12/22/2021 INDICATION: Ovarian cancer with recurrent ascites. Request for diagnostic and therapeutic paracentesis. EXAM: ULTRASOUND GUIDED PARACENTESIS MEDICATIONS: 1% lidocaine 10 mL COMPLICATIONS: None immediate. PROCEDURE: Informed written consent was obtained from the patient after a discussion of the risks, benefits and alternatives to treatment. A timeout was performed prior to the initiation of the procedure. Initial ultrasound scanning demonstrates a large amount of ascites within the right lateral abdomen. The right  lateral abdomen was prepped and draped in the usual sterile fashion. 1% lidocaine was used for local anesthesia. Following this, a 19 gauge, 7-cm, Yueh catheter was introduced. An ultrasound image was saved for documentation purposes. The paracentesis was performed. The catheter was removed and a dressing was applied. The patient tolerated the procedure well without immediate post procedural complication. FINDINGS: A total of approximately 4 L of clear yellow fluid was removed. Samples were sent to the laboratory as requested by the clinical team. IMPRESSION: Successful ultrasound-guided paracentesis yielding 4 liters of peritoneal fluid. Electronically Signed   By: Aletta Edouard M.D.   On: 12/22/2021 13:06   DG CHEST PORT 1 VIEW  Result Date: 12/21/2021 CLINICAL DATA:  Increasing shortness of breath, history of pleural effusion EXAM: PORTABLE CHEST 1 VIEW COMPARISON:  12/15/2021 FINDINGS: Slightly increased right pleural effusion, bilateral pleural effusions now moderate and similar in volume. No new airspace opacity. Right chest port catheter. Heart and mediastinum  are normal. IMPRESSION: Slightly increased right pleural effusion, bilateral pleural effusions now moderate and similar in volume. No new airspace opacity. Electronically Signed   By: Delanna Ahmadi M.D.   On: 12/21/2021 08:31   US Paracentesis  Result Date: 12/16/2021 INDICATION: Patient with history of ovarian cancer, recurrent ascites; request received for therapeutic paracentesis EXAM: ULTRASOUND GUIDED THERAPEUTIC PARACENTESIS MEDICATIONS: 8 ml 1% lidocaine COMPLICATIONS: None immediate. PROCEDURE: Informed written consent was obtained from the patient after a discussion of the risks, benefits and alternatives to treatment. A timeout was performed prior to the initiation of the procedure. Initial ultrasound scanning demonstrates a large amount of ascites within the right lower abdominal quadrant. The right lower abdomen was prepped and  draped in the usual sterile fashion. 1% lidocaine was used for local anesthesia. Following this, a 19 gauge, 7-cm, Yueh catheter was introduced. An ultrasound image was saved for documentation purposes. The paracentesis was performed. The catheter was removed and a dressing was applied. The patient tolerated the procedure well without immediate post procedural complication. Albumin administration will be ordered by primary care team FINDINGS: A total of approximately 5 liters of golden yellow fluid was removed. IMPRESSION: Successful ultrasound-guided therapeutic paracentesis yielding 5 liters of peritoneal fluid. Read by: Rowe Robert, PA-C Electronically Signed   By: Markus Daft M.D.   On: 12/16/2021 15:03   DG Chest Portable 1 View  Result Date: 12/15/2021 CLINICAL DATA:  Shortness of breath. Cancer patient. Chemotherapy 1 week ago. EXAM: PORTABLE CHEST 1 VIEW COMPARISON:  Two-view chest x-ray 12/07/2021 FINDINGS: Heart size exaggerated by low lung volumes. A left pleural effusion has increased significantly. Asymmetric airspace disease is present on the left. Moderate pulmonary vascular congestion is new. IMPRESSION: 1. Increased left pleural effusion. 2. Associated airspace disease in the left lung likely reflects atelectasis. 3. New pulmonary vascular congestion. Electronically Signed   By: San Morelle M.D.   On: 12/15/2021 12:47   US Paracentesis  Result Date: 12/08/2021 INDICATION: Patient with history of ovarian cancer, recurrent ascites. Request received for therapeutic paracentesis. EXAM: ULTRASOUND GUIDED THERAPEUTIC PARACENTESIS MEDICATIONS: 8 ml 1% lidocaine COMPLICATIONS: None immediate. PROCEDURE: Informed written consent was obtained from the patient after a discussion of the risks, benefits and alternatives to treatment. A timeout was performed prior to the initiation of the procedure. Initial ultrasound scanning demonstrates a large amount of ascites within the right upper to mid  abdominal quadrant. The right upper to mid abdomen was prepped and draped in the usual sterile fashion. 1% lidocaine was used for local anesthesia. Following this, a 19 gauge, 7-cm, Yueh catheter was introduced. An ultrasound image was saved for documentation purposes. The paracentesis was performed. The catheter was removed and a dressing was applied. The patient tolerated the procedure well without immediate post procedural complication. FINDINGS: A total of approximately 4 liters of amber fluid was removed. IMPRESSION: Successful ultrasound-guided therapeutic paracentesis yielding 4 liters of peritoneal fluid. Read by: Rowe Robert, PA-C Electronically Signed   By: Jacqulynn Cadet M.D.   On: 12/08/2021 16:47   DG Chest 2 View  Result Date: 12/07/2021 CLINICAL DATA:  Shortness of breath. Ascites. Question pleural effusion. Diagnosis ovarian cancer 6 months ago. Ligament ascites. EXAM: CHEST - 2 VIEW COMPARISON:  CT chest 11/25/2021 FINDINGS: Right chest wall porta catheter with tip overlying the central aspect of the superior vena cava, new from 11/25/2021 CT. Cardiac silhouette and mediastinal contours are within normal limits. Mild calcification within the aortic arch. Mildly decreased lung volumes. Small left  and minimal right pleural effusions, similar to prior CT. No pneumothorax is seen. Moderate T7-8 and T8-9 disc space narrowing, endplate sclerosis, and anterior osteophytosis, unchanged from prior CT. IMPRESSION: Small left and minimal right pleural effusions, similar to prior CT. Electronically Signed   By: Yvonne Kendall M.D.   On: 12/07/2021 08:43    Microbiology: Results for orders placed or performed during the hospital encounter of 12/15/21  Culture, blood (routine x 2)     Status: None   Collection Time: 12/15/21 12:25 PM   Specimen: Porta Cath; Blood  Result Value Ref Range Status   Specimen Description   Final    PORTA CATH RIGHT Performed at [facility], [place] [address], [place], [place] [address]    Special Requests   Final    BOTTLES DRAWN AEROBIC AND ANAEROBIC Blood Culture adequate volume Performed at [facility], [address] [place], [place] [address]    Culture   Final    NO GROWTH 5 DAYS Performed at [facility], [place] [address] [place], [place] [address]    Report Status 12/20/2021 FINAL  Final  Culture, blood (routine x 2)     Status: None   Collection Time: 12/15/21  7:44 PM   Specimen: BLOOD  Result Value Ref Range Status   Specimen Description   Final    BLOOD BLOOD LEFT ARM Performed at [facility] [address] [place], [place] [address]    Special Requests   Final    BOTTLES DRAWN AEROBIC ONLY Blood Culture results may not be optimal due to an inadequate volume of blood received in culture bottles Performed at [facility] [address] [place], [place] [address]    Culture   Final    NO GROWTH 5 DAYS Performed at [facility] Lab, [facility] [address] [place], [place] [address]    Report Status 12/20/2021 FINAL  Final  Gastrointestinal Panel by PCR , Stool     Status: None   Collection Time: 12/17/21  6:52 AM   Specimen: Stool  Result Value Ref Range Status   Campylobacter species NOT DETECTED NOT DETECTED Final   Plesimonas shigelloides NOT DETECTED NOT DETECTED Final   Salmonella species NOT DETECTED NOT DETECTED Final   Yersinia enterocolitica NOT DETECTED NOT DETECTED Final   Vibrio species NOT DETECTED NOT DETECTED Final   Vibrio cholerae NOT DETECTED NOT DETECTED Final   Enteroaggregative E coli (EAEC) NOT DETECTED NOT DETECTED Final   Enteropathogenic E coli (EPEC) NOT DETECTED NOT DETECTED Final   Enterotoxigenic E coli (ETEC) NOT DETECTED NOT DETECTED Final   Shiga like toxin producing E coli (STEC) NOT DETECTED NOT DETECTED Final   Shigella/Enteroinvasive E coli (EIEC) NOT DETECTED NOT DETECTED Final   Cryptosporidium NOT DETECTED NOT DETECTED Final    Cyclospora cayetanensis NOT DETECTED NOT DETECTED Final   Entamoeba histolytica NOT DETECTED NOT DETECTED Final   Giardia lamblia NOT DETECTED NOT DETECTED Final   Adenovirus F40/41 NOT DETECTED NOT DETECTED Final   Astrovirus NOT DETECTED NOT DETECTED Final   Norovirus GI/GII NOT DETECTED NOT DETECTED Final   Rotavirus A NOT DETECTED NOT DETECTED Final   Sapovirus (I, II, IV, and V) NOT DETECTED NOT DETECTED Final    Comment: Performed at [facility], [address], [place], [place] [address]  C Difficile Quick Screen w PCR reflex     Status: None   Collection Time: 12/17/21  6:52 AM   Specimen: Stool  Result Value Ref Range Status   C Diff antigen NEGATIVE NEGATIVE Final   C Diff toxin NEGATIVE NEGATIVE Final   C Diff interpretation No C. difficile detected.  Final    Comment: Performed at [facility], [place] [address], [place], [place] [address]  Remove urinary catheter to obtain Straight Cath urine culture     Status: Abnormal   Collection Time: 12/25/21  6:00 PM   Specimen: In/Out Cath Urine  Result Value Ref Range Status   Specimen Description   Final    IN/OUT CATH URINE Performed at [facility] [address], [place], [place] [address]    Special Requests   Final    Immunocompromised Performed at [facility], [place] [address] [place], [place] [address]    Culture MULTIPLE SPECIES PRESENT, SUGGEST RECOLLECTION (A)  Final   Report Status 12/27/2021 FINAL  Final    Labs: CBC: Recent Labs  Lab 12/29/21 0406 12/30/21 0403 12/31/21 0258 01/01/22 0329 01/02/22 0400  WBC 30.6* 34.2* 36.4* 28.9* 28.1*  NEUTROABS 25.3* 28.9* 31.2* 23.9* 23.7*  HGB 10.8* 10.8* 10.4* 10.4* 10.1*  HCT 33.7* 33.6* 32.6* 32.6* 32.7*  MCV 88.9 89.6 89.8 89.8 90.3  PLT 421* 494* 504* 518* 301*   Basic Metabolic Panel: Recent Labs  Lab 12/29/21 0406 12/30/21 0403 12/31/21 0258 01/01/22 0329 01/02/22 0400  NA 139 138  139 140 138  K 3.1* 3.0* 3.5 4.3 3.7  CL 108 108 110 111 107  CO2 21* '22 22 24 24  '$ GLUCOSE 96 119* 116* 89 94  BUN 27* 26* '21 18 19  '$ CREATININE 0.95 0.92 0.80 0.85 0.68  CALCIUM 8.0* 7.9* 7.9* 8.2* 8.2*  MG 1.3* 2.1 1.6* 2.0  --    Liver Function Tests: Recent Labs  Lab 12/29/21 0406 12/30/21 0403 12/31/21 0258 01/01/22 0329 01/02/22 0400  AST '20 25 22 24 20  '$ ALT '15 20 19 20 19  '$ ALKPHOS 220* 226* 211* 200* 191*  BILITOT 0.3 0.4 0.4 0.4 0.4  PROT 4.8* 5.0* 5.3* 5.2* 5.3*  ALBUMIN 1.6* 1.7* 1.7* 1.8* 1.7*   CBG: No results for input(s): "GLUCAP" in the last 168 hours.  Discharge time spent: 42 MINUTES.   Signed: Hosie Poisson, MD [facility] 01/03/2022

## 2022-01-03 NOTE — TOC Transition Note (Addendum)
Transition of Care [facility]) - CM/SW Discharge Note   Patient Details  Name: Maria Romero MRN: [id number] Date of Birth: 06/12/1950  [facility] CM/SW Contact:  Roseanne Kaufman, RN Phone Number: 01/03/2022, 1:37 PM   Clinical Narrative:    - 2:21p: This RNCM faxed and emailed documents to [facility] with [employer]. Report can be called at [phone], room#515, RN and MD notified. MD request a 4pm scheduled PTAR to complete infusion. PTAR called for 4pm transport, packet at nursing desk.  TOC will continue to follow.   Discharge summary and SNF report sent to [facility] in [place]. Awaiting Shelia with [facility] to come to patient's room to complete paperwork. Awaiting room# and number to call report. This RNCM will coordinate PTAR transport, once all paperwork completed.   TOC will continue to follow.       Barriers to Discharge: Continued Medical Work up   Patient Goals and CMS Choice Patient states their goals for this hospitalization and ongoing recovery are:: return [place] with home health services CMS Medicare.gov Compare Post Acute Care list provided to:: Patient Choice offered to / list presented to : Patient  Discharge Placement                       Discharge Plan and Services In-house Referral: NA Discharge Planning Services: CM Consult Post Acute Care Choice: Home Health          DME Arranged: N/A DME Agency: NA       HH Arranged: PT HH Agency: [place] Date [place]: 12/22/21 Time [place]: 1157 Representative spoke with at [place] Number Two: [place] (SDOH) Interventions     Readmission Risk Interventions    01/02/2022    2:09 PM  Readmission Risk Prevention Plan  Transportation Screening Complete  PCP or Specialist Appt within 3-5 Days Complete  HRI or [place] Complete  Social Work Consult for [place] Planning/Counseling Complete  Palliative Care Screening  Complete  Medication Review Press photographer) Complete

## 2022-01-04 ENCOUNTER — Encounter: Payer: Medicare Other | Admitting: Dietician

## 2022-01-05 ENCOUNTER — Other Ambulatory Visit: Payer: Self-pay | Admitting: Hematology & Oncology

## 2022-01-05 DIAGNOSIS — M810 Age-related osteoporosis without current pathological fracture: Secondary | ICD-10-CM | POA: Diagnosis not present

## 2022-01-05 DIAGNOSIS — K219 Gastro-esophageal reflux disease without esophagitis: Secondary | ICD-10-CM | POA: Diagnosis not present

## 2022-01-05 DIAGNOSIS — C561 Malignant neoplasm of right ovary: Secondary | ICD-10-CM

## 2022-01-05 DIAGNOSIS — E7800 Pure hypercholesterolemia, unspecified: Secondary | ICD-10-CM | POA: Diagnosis not present

## 2022-01-06 ENCOUNTER — Encounter: Payer: Self-pay | Admitting: *Deleted

## 2022-01-06 ENCOUNTER — Other Ambulatory Visit: Payer: Self-pay | Admitting: *Deleted

## 2022-01-06 ENCOUNTER — Telehealth: Payer: Self-pay | Admitting: *Deleted

## 2022-01-06 DIAGNOSIS — C561 Malignant neoplasm of right ovary: Secondary | ICD-10-CM

## 2022-01-06 DIAGNOSIS — D702 Other drug-induced agranulocytosis: Secondary | ICD-10-CM

## 2022-01-06 DIAGNOSIS — J90 Pleural effusion, not elsewhere classified: Secondary | ICD-10-CM | POA: Diagnosis not present

## 2022-01-06 DIAGNOSIS — E7800 Pure hypercholesterolemia, unspecified: Secondary | ICD-10-CM

## 2022-01-06 DIAGNOSIS — E440 Moderate protein-calorie malnutrition: Secondary | ICD-10-CM | POA: Diagnosis not present

## 2022-01-06 DIAGNOSIS — J9601 Acute respiratory failure with hypoxia: Secondary | ICD-10-CM | POA: Diagnosis not present

## 2022-01-06 DIAGNOSIS — Z95828 Presence of other vascular implants and grafts: Secondary | ICD-10-CM

## 2022-01-06 DIAGNOSIS — R180 Malignant ascites: Secondary | ICD-10-CM

## 2022-01-06 DIAGNOSIS — D709 Neutropenia, unspecified: Secondary | ICD-10-CM | POA: Diagnosis not present

## 2022-01-06 DIAGNOSIS — L89322 Pressure ulcer of left buttock, stage 2: Secondary | ICD-10-CM | POA: Diagnosis not present

## 2022-01-06 DIAGNOSIS — E875 Hyperkalemia: Secondary | ICD-10-CM | POA: Diagnosis not present

## 2022-01-06 NOTE — Telephone Encounter (Signed)
Call received from [place] at [facility] to inform Dr. Marin Olp that pt is uncomfortable and in need of a paracentesis.  Dr. Marin Olp notified.  Order placed for US paracentesis and scheduled at [facility] for tomorrow at [facility] notified to have pt at [facility] at 2:30PM.  Maria Romero is appreciative of assistance and has no other needs for pt at this time.

## 2022-01-06 NOTE — Patient Outreach (Signed)
Per [facility] Mrs. Mankin admitted to [facility] on 01/03/22. Screening for potential [facility] care coordination services as benefit of insurance plan and PCP.  Secure communication sent to SNF SW to make aware writer following transition plans and for  potential [facility] needs.   Marthenia Rolling, MSN, RN,BSN [place] Acute Care Coordinator [phone] (Direct dial)

## 2022-01-06 NOTE — Progress Notes (Signed)
Patient has been discharged from the hospital to a rehab facility. She will need to follow up in our office next week for consideration of treatment.   She has an upcoming appointment with genetics. Spoke to [employer] and we will plan to draw her genetic specimen while in the office on Tuesday.   Oncology Nurse Navigator Documentation     01/06/2022   10:00 AM  Oncology Nurse Navigator Flowsheets  Navigator Follow Up Date: 01/11/2022  Navigator Follow Up Reason: Follow-up Appointment;Chemotherapy  Navigator Location [facility]  Navigator Encounter Type Appt/Treatment Plan Review  Patient Visit Type MedOnc  Treatment Phase Active Tx  Barriers/Navigation Needs Coordination of Care;Education  Interventions Coordination of Care  Acuity Level 2-Minimal Needs (1-2 Barriers Identified)  Coordination of Care Other  Support Groups/Services Friends and Family  Time Spent with Patient 30

## 2022-01-07 ENCOUNTER — Ambulatory Visit (HOSPITAL_COMMUNITY)
Admission: RE | Admit: 2022-01-07 | Discharge: 2022-01-07 | Disposition: A | Discharge status: Home / Self Care | Payer: Medicare Other | Source: Ambulatory Visit | Attending: Hematology & Oncology | Admitting: Hematology & Oncology

## 2022-01-07 DIAGNOSIS — D702 Other drug-induced agranulocytosis: Secondary | ICD-10-CM | POA: Insufficient documentation

## 2022-01-07 DIAGNOSIS — C561 Malignant neoplasm of right ovary: Secondary | ICD-10-CM | POA: Diagnosis not present

## 2022-01-07 DIAGNOSIS — R188 Other ascites: Secondary | ICD-10-CM | POA: Diagnosis not present

## 2022-01-07 DIAGNOSIS — R180 Malignant ascites: Secondary | ICD-10-CM | POA: Diagnosis not present

## 2022-01-07 DIAGNOSIS — Z95828 Presence of other vascular implants and grafts: Secondary | ICD-10-CM | POA: Diagnosis not present

## 2022-01-07 DIAGNOSIS — E7800 Pure hypercholesterolemia, unspecified: Secondary | ICD-10-CM | POA: Diagnosis not present

## 2022-01-07 MED ORDER — LIDOCAINE HCL 1 % IJ SOLN
INTRAMUSCULAR | Status: AC
  Administered 2022-01-07: 15 mL
  Filled 2022-01-07: qty 20

## 2022-01-07 NOTE — Procedures (Signed)
PROCEDURE SUMMARY:  Successful US guided therapeutic paracentesis from [facility].  Yielded 3 L of clear, yellow fluid.  No immediate complications.  Pt tolerated well.   Specimen not sent for labs.  EBL < 1 mL  Tyson Alias, AGNP 01/07/2022 3:33 PM

## 2022-01-10 ENCOUNTER — Other Ambulatory Visit: Payer: Self-pay | Admitting: *Deleted

## 2022-01-10 DIAGNOSIS — E875 Hyperkalemia: Secondary | ICD-10-CM | POA: Diagnosis not present

## 2022-01-10 DIAGNOSIS — E785 Hyperlipidemia, unspecified: Secondary | ICD-10-CM | POA: Diagnosis not present

## 2022-01-10 DIAGNOSIS — J9601 Acute respiratory failure with hypoxia: Secondary | ICD-10-CM | POA: Diagnosis not present

## 2022-01-10 DIAGNOSIS — R609 Edema, unspecified: Secondary | ICD-10-CM | POA: Diagnosis not present

## 2022-01-10 DIAGNOSIS — I951 Orthostatic hypotension: Secondary | ICD-10-CM | POA: Diagnosis not present

## 2022-01-10 DIAGNOSIS — D709 Neutropenia, unspecified: Secondary | ICD-10-CM | POA: Diagnosis not present

## 2022-01-10 DIAGNOSIS — E440 Moderate protein-calorie malnutrition: Secondary | ICD-10-CM | POA: Diagnosis not present

## 2022-01-10 DIAGNOSIS — L89322 Pressure ulcer of left buttock, stage 2: Secondary | ICD-10-CM | POA: Diagnosis not present

## 2022-01-10 DIAGNOSIS — J90 Pleural effusion, not elsewhere classified: Secondary | ICD-10-CM | POA: Diagnosis not present

## 2022-01-10 NOTE — Patient Outreach (Signed)
[place] Coordinator follow up. Mrs. Titsworth resides resides in [facility]. Screening for potential [facility] care coordination services as a benefit of insurance plan and PCP.   Update received from [place], [place] SW indicating Mrs. Goodgame is from home alone independently. Plan is to return home post SNF.  Care plan meeting scheduled for Monday or Tuesday.  Will continue to follow.   Marthenia Rolling, MSN, RN,BSN [place] Acute Care Coordinator [phone] (Direct dial)

## 2022-01-11 ENCOUNTER — Ambulatory Visit: Payer: Medicare Other | Admitting: Dietician

## 2022-01-11 ENCOUNTER — Inpatient Hospital Stay: Payer: Medicare Other | Admitting: Hematology & Oncology

## 2022-01-11 ENCOUNTER — Other Ambulatory Visit (HOSPITAL_COMMUNITY): Payer: Self-pay

## 2022-01-11 ENCOUNTER — Telehealth: Payer: Self-pay | Admitting: Pharmacist

## 2022-01-11 ENCOUNTER — Inpatient Hospital Stay: Payer: Medicare Other

## 2022-01-11 ENCOUNTER — Other Ambulatory Visit: Payer: Self-pay

## 2022-01-11 ENCOUNTER — Encounter: Payer: Self-pay | Admitting: *Deleted

## 2022-01-11 ENCOUNTER — Telehealth: Payer: Self-pay

## 2022-01-11 ENCOUNTER — Encounter: Payer: Self-pay | Admitting: Hematology & Oncology

## 2022-01-11 ENCOUNTER — Inpatient Hospital Stay: Payer: Medicare Other | Attending: Hematology & Oncology

## 2022-01-11 VITALS — BP 124/88 | HR 114 | Temp 97.8°F | Resp 20 | Ht 63.0 in | Wt 133.4 lb

## 2022-01-11 DIAGNOSIS — C561 Malignant neoplasm of right ovary: Secondary | ICD-10-CM

## 2022-01-11 DIAGNOSIS — Z5111 Encounter for antineoplastic chemotherapy: Secondary | ICD-10-CM | POA: Insufficient documentation

## 2022-01-11 DIAGNOSIS — J90 Pleural effusion, not elsewhere classified: Secondary | ICD-10-CM | POA: Diagnosis not present

## 2022-01-11 DIAGNOSIS — R188 Other ascites: Secondary | ICD-10-CM | POA: Insufficient documentation

## 2022-01-11 DIAGNOSIS — Z803 Family history of malignant neoplasm of breast: Secondary | ICD-10-CM | POA: Diagnosis not present

## 2022-01-11 LAB — CBC WITH DIFFERENTIAL (CANCER CENTER ONLY)
Abs Immature Granulocytes: 0.23 10*3/uL — ABNORMAL HIGH (ref 0.00–0.07)
Basophils Absolute: 0.1 10*3/uL (ref 0.0–0.1)
Basophils Relative: 1 %
Eosinophils Absolute: 0.1 10*3/uL (ref 0.0–0.5)
Eosinophils Relative: 1 %
HCT: 33.1 % — ABNORMAL LOW (ref 36.0–46.0)
Hemoglobin: 10.4 g/dL — ABNORMAL LOW (ref 12.0–15.0)
Immature Granulocytes: 1 %
Lymphocytes Relative: 11 %
Lymphs Abs: 2.2 10*3/uL (ref 0.7–4.0)
MCH: 27.9 pg (ref 26.0–34.0)
MCHC: 31.4 g/dL (ref 30.0–36.0)
MCV: 88.7 fL (ref 80.0–100.0)
Monocytes Absolute: 1.1 10*3/uL — ABNORMAL HIGH (ref 0.1–1.0)
Monocytes Relative: 6 %
Neutro Abs: 16.4 10*3/uL — ABNORMAL HIGH (ref 1.7–7.7)
Neutrophils Relative %: 80 %
Platelet Count: 541 10*3/uL — ABNORMAL HIGH (ref 150–400)
RBC: 3.73 MIL/uL — ABNORMAL LOW (ref 3.87–5.11)
RDW: 17.2 % — ABNORMAL HIGH (ref 11.5–15.5)
WBC Count: 20.2 10*3/uL — ABNORMAL HIGH (ref 4.0–10.5)
nRBC: 0 % (ref 0.0–0.2)

## 2022-01-11 LAB — FERRITIN: Ferritin: 1027 ng/mL — ABNORMAL HIGH (ref 11–307)

## 2022-01-11 LAB — TOTAL PROTEIN, URINE DIPSTICK: Protein, ur: 30 mg/dL — AB

## 2022-01-11 LAB — IRON AND IRON BINDING CAPACITY (CC-WL,HP ONLY)
Iron: 11 ug/dL — ABNORMAL LOW (ref 28–170)
Saturation Ratios: 7 % — ABNORMAL LOW (ref 10.4–31.8)
TIBC: 160 ug/dL — ABNORMAL LOW (ref 250–450)
UIBC: 149 ug/dL (ref 148–442)

## 2022-01-11 LAB — CMP (CANCER CENTER ONLY)
ALT: 12 U/L (ref 0–44)
AST: 16 U/L (ref 15–41)
Albumin: 2.8 g/dL — ABNORMAL LOW (ref 3.5–5.0)
Alkaline Phosphatase: 108 U/L (ref 38–126)
Anion gap: 9 (ref 5–15)
BUN: 18 mg/dL (ref 8–23)
CO2: 22 mmol/L (ref 22–32)
Calcium: 8.7 mg/dL — ABNORMAL LOW (ref 8.9–10.3)
Chloride: 104 mmol/L (ref 98–111)
Creatinine: 0.9 mg/dL (ref 0.44–1.00)
GFR, Estimated: >60 mL/min (ref >60)
Glucose, Bld: 102 mg/dL — ABNORMAL HIGH (ref 70–99)
Potassium: 4.2 mmol/L (ref 3.5–5.1)
Sodium: 135 mmol/L (ref 135–145)
Total Bilirubin: 0.3 mg/dL (ref 0.3–1.2)
Total Protein: 6.1 g/dL — ABNORMAL LOW (ref 6.5–8.1)

## 2022-01-11 LAB — PREALBUMIN: Prealbumin: 14 mg/dL — ABNORMAL LOW (ref 18–38)

## 2022-01-11 LAB — CA 125: Cancer Antigen (CA) 125: 1313 U/mL — ABNORMAL HIGH (ref 0.0–38.1)

## 2022-01-11 LAB — LACTATE DEHYDROGENASE: LDH: 267 U/L — ABNORMAL HIGH (ref 98–192)

## 2022-01-11 LAB — SAMPLE TO BLOOD BANK

## 2022-01-11 MED ORDER — SODIUM CHLORIDE 0.9 % IV SOLN
15.0000 mg/kg | Freq: Once | INTRAVENOUS | Status: AC
  Administered 2022-01-11: 900 mg via INTRAVENOUS
  Filled 2022-01-11: qty 32

## 2022-01-11 MED ORDER — SODIUM CHLORIDE 0.9 % IV SOLN: Freq: Once | INTRAVENOUS | Status: AC

## 2022-01-11 MED ORDER — PALONOSETRON HCL INJECTION 0.25 MG/5ML
0.2500 mg | Freq: Once | INTRAVENOUS | Status: AC
  Administered 2022-01-11: 0.25 mg via INTRAVENOUS
  Filled 2022-01-11: qty 5

## 2022-01-11 MED ORDER — HEPARIN SOD (PORK) LOCK FLUSH 100 UNIT/ML IV SOLN
500.0000 [IU] | Freq: Once | INTRAVENOUS | Status: AC | PRN
  Administered 2022-01-11: 500 [IU]

## 2022-01-11 MED ORDER — SODIUM CHLORIDE 0.9 % IV SOLN
385.0000 mg | Freq: Once | INTRAVENOUS | Status: AC
  Administered 2022-01-11: 390 mg via INTRAVENOUS
  Filled 2022-01-11: qty 39

## 2022-01-11 MED ORDER — SODIUM CHLORIDE 0.9 % IV SOLN
10.0000 mg | Freq: Once | INTRAVENOUS | Status: AC
  Administered 2022-01-11: 10 mg via INTRAVENOUS
  Filled 2022-01-11: qty 10

## 2022-01-11 MED ORDER — SODIUM CHLORIDE 0.9% FLUSH
10.0000 mL | INTRAVENOUS | Status: DC | PRN
  Administered 2022-01-11: 10 mL

## 2022-01-11 MED ORDER — SODIUM CHLORIDE 0.9 % IV SOLN
150.0000 mg | Freq: Once | INTRAVENOUS | Status: AC
  Administered 2022-01-11: 150 mg via INTRAVENOUS
  Filled 2022-01-11: qty 150

## 2022-01-11 MED ORDER — OLAPARIB 100 MG PO TABS
200.0000 mg | ORAL_TABLET | Freq: Two times a day (BID) | ORAL | 4 refills | Status: DC
  Filled 2022-01-11: qty 120, 30d supply, fill #0

## 2022-01-11 MED ORDER — SODIUM CHLORIDE 0.9 % IV SOLN: Freq: Once | INTRAVENOUS | Status: DC

## 2022-01-11 MED ORDER — SODIUM CHLORIDE 0.9% FLUSH
10.0000 mL | Freq: Once | INTRAVENOUS | Status: AC
  Administered 2022-01-11: 10 mL

## 2022-01-11 NOTE — Progress Notes (Signed)
Ok to treat with pulse 114 per Dr. Marin Olp.

## 2022-01-11 NOTE — Telephone Encounter (Signed)
Oral Oncology Pharmacist Encounter   Prior Authorization for Maria Romero has been denied.     Per patient's insurance she will not be approved for Maria Romero unless it is being used in the maintenance setting after a CR or PR to first line platinum based chemotherapy.   Dr. Marin Olp notified and provided with phone number (tele: [phone]) to appeal decision via peer to peer.    Maria Romero, PharmD, BCPS, [facility] Hematology/Oncology Clinical Pharmacist Maria Romero and Maria Romero [phone] 01/11/2022 3:48 PM

## 2022-01-11 NOTE — Telephone Encounter (Signed)
Oral Oncology Patient Advocate Encounter   Received notification that prior authorization for Maria Romero is required.   PA submitted on 10.03.23  Key [id number]  Status is pending     Berdine Addison, [place] Patient [place]  [phone] (phone) [phone] (fax)

## 2022-01-11 NOTE — Progress Notes (Signed)
Called patient's daughter at her mobile#.  She reports she is unaware how long patient will remain in SNF.  I told her I am happy to follow her mom when she returns home and available for questions or concerns if she needs me in the meantime.  Sent text with my contact information.  April Manson, RDN, LDN Registered Dietitian, [place] Part Time Remote (Usual office hours: Tuesday-Thursday) Mobile: [phone] Remote Office: [phone]

## 2022-01-11 NOTE — Progress Notes (Signed)
Hematology and Oncology Follow Up Visit  TERREL MANALO [id number] [birth year] 71 y.o. 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 Birchler is finally back for an office visit.  She, unfortunately, was hospitalized for several weeks because of toxicity to the upfront chemotherapy with the carboplatinum and Taxol.  She really had a much much more difficult time than I would have never thought.  However, she has begun to improve.  She now is at [facility].  She still is having some ascites recurrence.  She has some fluid taken off last Friday.  I think she had 3 L of fluid taken off.  She always feels better after having the fluid taken off.  We are clearly going to have to readjust her treatment protocol.  I still would like to try to get her to surgery but I am not sure we will be able to unless she really improves her performance status.  At least, the carboplatinum/Taxol was working.  Her CA-125 went from 2500 down to 1300.  I think that reasonable option for her would be single agent carboplatinum along with Avastin.  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  I just want to try to get her quality of life better.  This is really been incredibly challenging.  She is eating better.  Her albumin is up to 2.8.  She is having a little bit of pain over in the right lower quadrant of the abdomen.  She is not having any diarrhea.  She is having no problems with urine.  There is no dysuria or hematuria.  She is having no cough or shortness of breath.  While she was in the hospital.  She did have some pleural effusion taken off the left lung.  Currently, I would have to say that her performance status  is probably ECOG 2-3.    Medications:  Current Outpatient Medications:    atorvastatin (LIPITOR) 40 MG tablet, TAKE ONE TABLET BY MOUTH EVERY NIGHT AT BEDTIME, Disp: 90 tablet, Rfl: 3   Calcium-Vitamin D-Vitamin K (VIACTIV PO), Take by mouth See admin instructions. Chew 1 square by mouth once a day- for calcium, Disp: , Rfl:    cholecalciferol (VITAMIN D3) 25 MCG (1000 UNIT) tablet, Take 1,000 Units by mouth daily., Disp: , Rfl:    diphenoxylate-atropine (LOMOTIL) 2.5-0.025 MG tablet, Take 1 tablet by mouth 4 (four) times daily as needed for diarrhea or loose stools., Disp: 30 tablet, Rfl: 0   feeding supplement (ENSURE ENLIVE / ENSURE PLUS) LIQD, Take 237 mLs by mouth daily., Disp: 7110 mL, Rfl: 2   fiber supplement, BANATROL TF, liquid, Take 60 mLs by mouth 2 (two) times daily., Disp: , Rfl:    megestrol (MEGACE) 400 MG/10ML suspension, Take 10 mLs (400 mg total) by mouth 2 (two) times daily., Disp: 240 mL, Rfl: 0   midodrine (PROAMATINE) 10 MG tablet, Take 1 tablet (10 mg total) by mouth 3 (three) times daily with meals., Disp: 90 tablet, Rfl: 1   mirtazapine (REMERON SOL-TAB) 15 MG disintegrating tablet, Take 1 tablet (15 mg total) by mouth at bedtime., Disp: 30 tablet, Rfl: 0   Multiple Vitamin (MULTIVITAMIN WITH MINERALS) TABS tablet, Take 1 tablet by mouth daily., Disp: ,  Rfl:    olaparib (LYNPARZA) 100 MG tablet, Take 2 tablets (200 mg total) by mouth 2 (two) times daily. Swallow whole. May take with food to decrease nausea and vomiting., Disp: 120 tablet, Rfl: 4   Omega-3 Fatty Acids (FISH OIL MAXIMUM STRENGTH) 1200 MG CAPS, Take 1,200 mg by mouth See admin instructions. Take 1,200 mg by mouth two times a day twice weekly, Disp: , Rfl:    omeprazole (PRILOSEC) 20 MG capsule, Take 20 mg by mouth daily., Disp: , Rfl:    senna-docusate (SENOKOT-S) 8.6-50 MG tablet, Take 1 tablet by mouth daily., Disp: , Rfl:    venlafaxine (EFFEXOR) 37.5 MG tablet, Take 1 tablet (37.5 mg total) by mouth 2  (two) times daily with a meal., Disp: 60 tablet, Rfl: 0   dexamethasone (DECADRON) 4 MG tablet, Take 2 tablets by mouth starting the day after chemotherapy for 3 days. Take with food. (Patient not taking: Reported on 01/11/2022), Disp: 30 tablet, Rfl: 1   ondansetron (ZOFRAN) 8 MG tablet, Take 1 tablet (8 mg total) by mouth every 8 (eight) hours as needed for nausea or vomiting. Start on the third day after chemotherapy. (Patient not taking: Reported on 01/11/2022), Disp: 30 tablet, Rfl: 1   PRILOSEC OTC 20 MG tablet, Take 20 mg by mouth daily before breakfast. (Patient not taking: Reported on 01/11/2022), Disp: , Rfl:    prochlorperazine (COMPAZINE) 10 MG tablet, Take 1 tablet (10 mg total) by mouth every 6 (six) hours as needed for nausea or vomiting. (Patient not taking: Reported on 01/11/2022), Disp: 30 tablet, Rfl: 1 No current facility-administered medications for this visit.  Facility-Administered Medications Ordered in Other Visits:    0.9 %  sodium chloride infusion, , Intravenous, Once, Syanna Remmert, Rudell Cobb, MD   bevacizumab-bvzr (ZIRABEV) 900 mg in sodium chloride 0.9 % 100 mL chemo infusion, 15 mg/kg (Treatment Plan Recorded), Intravenous, Once, Volanda Napoleon, MD, Last Rate: 272 mL/hr at 01/11/22 1257, 900 mg at 01/11/22 1257   CARBOplatin (PARAPLATIN) 390 mg in sodium chloride 0.9 % 250 mL chemo infusion, 390 mg, Intravenous, Once, Megahn Killings, Rudell Cobb, MD   heparin lock flush 100 unit/mL, 500 Units, Intracatheter, Once PRN, Ally Knodel, Rudell Cobb, MD   sodium chloride flush (NS) 0.9 % injection 10 mL, 10 mL, Intracatheter, PRN, Volanda Napoleon, MD  Allergies:  Allergies  Allergen Reactions   Fluvastatin Sodium Other (See Comments)    Myalgias    Past Medical History, Surgical history, Social history, and Family History were reviewed and updated.  Review of Systems: Review of Systems  Constitutional: Negative.   HENT:  Negative.    Eyes: Negative.   Respiratory: Negative.     Cardiovascular: Negative.   Gastrointestinal:  Positive for abdominal pain.  Genitourinary:  Positive for pelvic pain.   Musculoskeletal: Negative.   Skin: Negative.   Neurological: Negative.   Hematological: Negative.   Psychiatric/Behavioral: Negative.      Physical Exam:  height is _0  (1.6 m) and weight is 133 lb 6.4 oz (60.5 kg). Her oral temperature is 97.8 F (36.6 C). Her blood pressure is 124/88 and her pulse is 114 (abnormal). Her respiration is 20 and oxygen saturation is 99%.   Wt Readings from Last 3 Encounters:  01/11/22 133 lb 6.4 oz (60.5 kg)  12/25/21 133 lb 9.6 oz (60.6 kg)  11/26/21 145 lb 1 oz (65.8 kg)    Physical Exam Vitals reviewed.  HENT:     Head: Normocephalic and atraumatic.  Eyes:  Pupils: Pupils are equal, round, and reactive to light.  Cardiovascular:     Rate and Rhythm: Normal rate and regular rhythm.     Heart sounds: Normal heart sounds.  Pulmonary:     Effort: Pulmonary effort is normal.     Breath sounds: Normal breath sounds.  Abdominal:     General: Bowel sounds are normal.     Palpations: Abdomen is soft.     Comments: Abdominal exam is slightly distended.  There is fluid wave.  There is little bit of tenderness throughout the abdominal cavity to palpation.  There is no abdominal mass.  There is no palpable liver or spleen tip.  Musculoskeletal:        General: No tenderness or deformity. Normal range of motion.     Cervical back: Normal range of motion.     Comments: She does have a little bit of swelling in the lower legs.  Lymphadenopathy:     Cervical: No cervical adenopathy.  Skin:    General: Skin is warm and dry.     Findings: No erythema or rash.  Neurological:     Mental Status: She is alert and oriented to person, place, and time.  Psychiatric:        Behavior: Behavior normal.        Thought Content: Thought content normal.        Judgment: Judgment normal.      Lab Results  Component Value Date   WBC  20.2 (H) 01/11/2022   HGB 10.4 (L) 01/11/2022   HCT 33.1 (L) 01/11/2022   MCV 88.7 01/11/2022   PLT 541 (H) 01/11/2022     Chemistry      Component Value Date/Time   NA 135 01/11/2022 1027   K 4.2 01/11/2022 1027   CL 104 01/11/2022 1027   CO2 22 01/11/2022 1027   BUN 18 01/11/2022 1027   CREATININE 0.90 01/11/2022 1027   CREATININE 0.93 11/03/2021 0000      Component Value Date/Time   CALCIUM 8.7 (L) 01/11/2022 1027   ALKPHOS 108 01/11/2022 1027   AST 16 01/11/2022 1027   ALT 12 01/11/2022 1027   BILITOT 0.3 01/11/2022 1027      Impression and Plan: Ms. Seese is a very charming 71 year old white female.  Looks like she has stage IIIC ovarian cancer.  She has a large right ovarian mass.  Again, I am just very disappointed that she had such a difficult time with chemotherapy.  We are going to have to make a significant adjustment to her protocol.  Again single agent carboplatinum along with Avastin certainly would be a good combination I think.  Given the fact that she is HRD positive, we can use a PARP inhibitor.  I will use a reduced dose of PARP inhibitor-olaparib at 200 mg p.o. twice daily.  I think the CA have 125 will show [place] what is going on.  This I think is a good indicator for response.  Hopefully, the ascites will resolve.  I know that will be very challenging to try to get her to surgery.  Hopefully, we can get her performance status improved.  I would like to see her back in about 10 days.  I think we will going to have to watch her very closely.     Volanda Napoleon, MD 10/3/202312:59 PM

## 2022-01-11 NOTE — Telephone Encounter (Signed)
Oral Oncology Patient Advocate Encounter  Received notification that the request for prior authorization for Maria Romero has been denied due to see denial letter attached to chart.     Berdine Addison, [place] [facility] Patient [place]  [phone] (phone) [phone] (fax)

## 2022-01-11 NOTE — Patient Instructions (Signed)
[facility] [place]  Discharge Instructions: Thank you for choosing [facility] to provide your oncology and hematology care.   If you have a lab appointment with the [facility], please go directly to the [facility] and check in at the registration area.  Wear comfortable clothing and clothing appropriate for easy access to any Portacath or PICC line.   We strive to give you quality time with your provider. You may need to reschedule your appointment if you arrive late (15 or more minutes).  Arriving late affects you and other patients whose appointments are after yours.  Also, if you miss three or more appointments without notifying the office, you may be dismissed from the clinic at the provider's discretion.      For prescription refill requests, have your pharmacy contact our office and allow 72 hours for refills to be completed.    Today you received the following chemotherapy and/or immunotherapy agents Avastin/Carboplatin      To help prevent nausea and vomiting after your treatment, we encourage you to take your nausea medication as directed.  BELOW ARE SYMPTOMS THAT SHOULD BE REPORTED IMMEDIATELY: *FEVER GREATER THAN 100.4 F (38 C) OR HIGHER *CHILLS OR SWEATING *NAUSEA AND VOMITING THAT IS NOT CONTROLLED WITH YOUR NAUSEA MEDICATION *UNUSUAL SHORTNESS OF BREATH *UNUSUAL BRUISING OR BLEEDING *URINARY PROBLEMS (pain or burning when urinating, or frequent urination) *BOWEL PROBLEMS (unusual diarrhea, constipation, pain near the anus) TENDERNESS IN MOUTH AND THROAT WITH OR WITHOUT PRESENCE OF ULCERS (sore throat, sores in mouth, or a toothache) UNUSUAL RASH, SWELLING OR PAIN  UNUSUAL VAGINAL DISCHARGE OR ITCHING   Items with * indicate a potential emergency and should be followed up as soon as possible or go to the Emergency Department if any problems should occur.  Please show the CHEMOTHERAPY ALERT CARD or IMMUNOTHERAPY ALERT CARD at  check-in to the Emergency Department and triage nurse. Should you have questions after your visit or need to cancel or reschedule your appointment, please contact [facility]  [phone] and follow the prompts.  Office hours are 8:00 a.m. to 4:30 p.m. Monday - Friday. Please note that voicemails left after 4:00 p.m. may not be returned until the following business day.  We are closed weekends and major holidays. You have access to a nurse at all times for urgent questions. Please call the main number to the clinic [phone] and follow the prompts.  For any non-urgent questions, you may also contact your provider using MyChart. We now offer e-Visits for anyone 21 and older to request care online for non-urgent symptoms. For details visit [email or web address].   Also download the MyChart app! Go to the app store, search "MyChart", open the app, select [facility], and log in with your MyChart username and password.  Masks are optional in the cancer centers. If you would like for your care team to wear a mask while they are taking care of you, please let them know. You may have one support person who is at least 73 years old accompany you for your appointments.

## 2022-01-11 NOTE — Patient Instructions (Signed)

## 2022-01-11 NOTE — Progress Notes (Unsigned)
Patient is here for follow up after her prolonged hospitalization. She is currently in a rehab facility. She states she is still incredibly weak, but feels that she is improving. She's a little nervous about starting a new treatment today, but understands that it's been modified in hopes that it'll be better tolerated.  Patient has an appointment with genetics later this month. Blood work for testing drawn today and shipped. Genetics aware.   Oncology Nurse Navigator Documentation     01/11/2022   10:45 AM  Oncology Nurse Navigator Flowsheets  Navigator Follow Up Date: 01/21/2022  Navigator Follow Up Reason: Follow-up Appointment  Navigator Location [facility]  Navigator Encounter Type Treatment;Appt/Treatment Plan Review  Patient Visit Type MedOnc  Treatment Phase Active Tx  Barriers/Navigation Needs Coordination of Care;Education  Education Other  Interventions Coordination of Care;Education;Psycho-Social Support  Acuity Level 2-Minimal Needs (1-2 Barriers Identified)  Coordination of Care Other  Education Method Verbal  Support Groups/Services Friends and Family  Time Spent with Patient 30

## 2022-01-12 LAB — ERYTHROPOIETIN: Erythropoietin: 56.9 m[IU]/mL — ABNORMAL HIGH (ref 2.6–18.5)

## 2022-01-13 ENCOUNTER — Encounter: Payer: Self-pay | Admitting: Hematology & Oncology

## 2022-01-13 ENCOUNTER — Telehealth: Payer: Self-pay | Admitting: *Deleted

## 2022-01-13 ENCOUNTER — Encounter: Payer: Self-pay | Admitting: *Deleted

## 2022-01-13 DIAGNOSIS — D709 Neutropenia, unspecified: Secondary | ICD-10-CM | POA: Diagnosis not present

## 2022-01-13 DIAGNOSIS — J90 Pleural effusion, not elsewhere classified: Secondary | ICD-10-CM | POA: Diagnosis not present

## 2022-01-13 DIAGNOSIS — R609 Edema, unspecified: Secondary | ICD-10-CM | POA: Diagnosis not present

## 2022-01-13 DIAGNOSIS — E440 Moderate protein-calorie malnutrition: Secondary | ICD-10-CM | POA: Diagnosis not present

## 2022-01-13 DIAGNOSIS — E785 Hyperlipidemia, unspecified: Secondary | ICD-10-CM | POA: Diagnosis not present

## 2022-01-13 DIAGNOSIS — L89322 Pressure ulcer of left buttock, stage 2: Secondary | ICD-10-CM | POA: Diagnosis not present

## 2022-01-13 DIAGNOSIS — M6281 Muscle weakness (generalized): Secondary | ICD-10-CM | POA: Diagnosis not present

## 2022-01-13 DIAGNOSIS — J9601 Acute respiratory failure with hypoxia: Secondary | ICD-10-CM | POA: Diagnosis not present

## 2022-01-13 DIAGNOSIS — E875 Hyperkalemia: Secondary | ICD-10-CM | POA: Diagnosis not present

## 2022-01-13 LAB — CA 125: Cancer Antigen (CA) 125: 760 U/mL — ABNORMAL HIGH (ref 0.0–38.1)

## 2022-01-13 NOTE — Progress Notes (Signed)
Volanda Napoleon, MD  P Onc Nurse Hp Call - the tumor marker went all the way down to 760!!!!  Amazing!!!   [employer] and notified patient of results. She is happy with the new result. She also states that so far, she is tolerating this new treatment much better than the last. So far she has no side effects and feels at her baseline. She is aware of her follow up appointment for toxicity check next Friday.   Oncology Nurse Navigator Documentation     01/13/2022    9:15 AM  Oncology Nurse Navigator Flowsheets  Navigator Follow Up Date: 01/21/2022  Navigator Follow Up Reason: Follow-up Appointment  Navigator Location [facility]  Navigator Encounter Type Telephone  Telephone Diagnostic Results;Outgoing Call  Patient Visit Type MedOnc  Treatment Phase Active Tx  Barriers/Navigation Needs Coordination of Care;Education  Education Other  Interventions Education;Psycho-Social Support  Acuity Level 2-Minimal Needs (1-2 Barriers Identified)  Education Method Verbal  Support Groups/Services Friends and Family  Time Spent with Patient 15

## 2022-01-13 NOTE — Telephone Encounter (Signed)
Oral Chemotherapy Pharmacist Encounter   Notified that Dr. Marin Olp is not planning to proceed with Lynparza (olaparib) therapy at this time. Will d/c prescription at this time. Oral oncology clinic will stop following.   Leron Croak, PharmD, BCPS, BCOP Hematology/Oncology Clinical Pharmacist [facility] [place] [phone] 01/13/2022 2:29 PM

## 2022-01-13 NOTE — Telephone Encounter (Signed)
Maria Romero was denied via fax.  Expedited appeal started.  Called [phone] for verbal appeal.  Received instructions for letter of medical necessity.  Dr Marin Olp decided to wait on the medication since patients tumor markers are going down and is tolerating therapy well.  Leron Croak [facility] notified.

## 2022-01-17 ENCOUNTER — Ambulatory Visit (HOSPITAL_COMMUNITY)
Admission: RE | Admit: 2022-01-17 | Discharge: 2022-01-17 | Disposition: A | Discharge status: Home / Self Care | Payer: Medicare Other | Source: Ambulatory Visit | Attending: Hematology & Oncology | Admitting: Hematology & Oncology

## 2022-01-17 DIAGNOSIS — R188 Other ascites: Secondary | ICD-10-CM | POA: Diagnosis not present

## 2022-01-17 DIAGNOSIS — L89322 Pressure ulcer of left buttock, stage 2: Secondary | ICD-10-CM | POA: Diagnosis not present

## 2022-01-17 DIAGNOSIS — J90 Pleural effusion, not elsewhere classified: Secondary | ICD-10-CM | POA: Diagnosis not present

## 2022-01-17 DIAGNOSIS — E875 Hyperkalemia: Secondary | ICD-10-CM | POA: Diagnosis not present

## 2022-01-17 DIAGNOSIS — M6281 Muscle weakness (generalized): Secondary | ICD-10-CM | POA: Diagnosis not present

## 2022-01-17 DIAGNOSIS — C561 Malignant neoplasm of right ovary: Secondary | ICD-10-CM | POA: Insufficient documentation

## 2022-01-17 DIAGNOSIS — J9601 Acute respiratory failure with hypoxia: Secondary | ICD-10-CM | POA: Diagnosis not present

## 2022-01-17 DIAGNOSIS — E440 Moderate protein-calorie malnutrition: Secondary | ICD-10-CM | POA: Diagnosis not present

## 2022-01-17 DIAGNOSIS — D709 Neutropenia, unspecified: Secondary | ICD-10-CM | POA: Diagnosis not present

## 2022-01-17 DIAGNOSIS — I951 Orthostatic hypotension: Secondary | ICD-10-CM | POA: Diagnosis not present

## 2022-01-17 DIAGNOSIS — L89152 Pressure ulcer of sacral region, stage 2: Secondary | ICD-10-CM | POA: Diagnosis not present

## 2022-01-17 MED ORDER — LIDOCAINE HCL 1 % IJ SOLN
INTRAMUSCULAR | Status: AC
  Administered 2022-01-17: 15 mL
  Filled 2022-01-17: qty 20

## 2022-01-17 NOTE — Procedures (Signed)
Ultrasound-guided therapeutic paracentesis performed yielding 1.3 liters of yellow  fluid. No immediate complications. EBL none.

## 2022-01-19 DIAGNOSIS — R2689 Other abnormalities of gait and mobility: Secondary | ICD-10-CM | POA: Diagnosis not present

## 2022-01-19 DIAGNOSIS — R188 Other ascites: Secondary | ICD-10-CM | POA: Diagnosis not present

## 2022-01-19 DIAGNOSIS — I951 Orthostatic hypotension: Secondary | ICD-10-CM | POA: Diagnosis not present

## 2022-01-19 DIAGNOSIS — K219 Gastro-esophageal reflux disease without esophagitis: Secondary | ICD-10-CM | POA: Diagnosis not present

## 2022-01-19 DIAGNOSIS — R2681 Unsteadiness on feet: Secondary | ICD-10-CM | POA: Diagnosis not present

## 2022-01-19 DIAGNOSIS — M6281 Muscle weakness (generalized): Secondary | ICD-10-CM | POA: Diagnosis not present

## 2022-01-21 ENCOUNTER — Encounter: Payer: Self-pay | Admitting: *Deleted

## 2022-01-21 ENCOUNTER — Inpatient Hospital Stay: Payer: Medicare Other

## 2022-01-21 ENCOUNTER — Inpatient Hospital Stay: Payer: Medicare Other | Admitting: Hematology & Oncology

## 2022-01-21 ENCOUNTER — Encounter: Payer: Self-pay | Admitting: Hematology & Oncology

## 2022-01-21 VITALS — BP 116/78 | HR 115 | Temp 97.5°F | Resp 20 | Ht 63.0 in | Wt 118.0 lb

## 2022-01-21 DIAGNOSIS — C561 Malignant neoplasm of right ovary: Secondary | ICD-10-CM | POA: Diagnosis not present

## 2022-01-21 DIAGNOSIS — Z5111 Encounter for antineoplastic chemotherapy: Secondary | ICD-10-CM | POA: Diagnosis not present

## 2022-01-21 DIAGNOSIS — Z95828 Presence of other vascular implants and grafts: Secondary | ICD-10-CM

## 2022-01-21 DIAGNOSIS — J90 Pleural effusion, not elsewhere classified: Secondary | ICD-10-CM | POA: Diagnosis not present

## 2022-01-21 DIAGNOSIS — R188 Other ascites: Secondary | ICD-10-CM | POA: Diagnosis not present

## 2022-01-21 LAB — SAMPLE TO BLOOD BANK

## 2022-01-21 LAB — CBC WITH DIFFERENTIAL (CANCER CENTER ONLY)
Abs Immature Granulocytes: 0.04 10*3/uL (ref 0.00–0.07)
Basophils Absolute: 0 10*3/uL (ref 0.0–0.1)
Basophils Relative: 1 %
Eosinophils Absolute: 0.1 10*3/uL (ref 0.0–0.5)
Eosinophils Relative: 1 %
HCT: 25.6 % — ABNORMAL LOW (ref 36.0–46.0)
Hemoglobin: 8 g/dL — ABNORMAL LOW (ref 12.0–15.0)
Immature Granulocytes: 1 %
Lymphocytes Relative: 27 %
Lymphs Abs: 2.4 10*3/uL (ref 0.7–4.0)
MCH: 28 pg (ref 26.0–34.0)
MCHC: 31.3 g/dL (ref 30.0–36.0)
MCV: 89.5 fL (ref 80.0–100.0)
Monocytes Absolute: 0.5 10*3/uL (ref 0.1–1.0)
Monocytes Relative: 6 %
Neutro Abs: 5.7 10*3/uL (ref 1.7–7.7)
Neutrophils Relative %: 64 %
Platelet Count: 218 10*3/uL (ref 150–400)
RBC: 2.86 MIL/uL — ABNORMAL LOW (ref 3.87–5.11)
RDW: 16.8 % — ABNORMAL HIGH (ref 11.5–15.5)
WBC Count: 8.7 10*3/uL (ref 4.0–10.5)
nRBC: 0 % (ref 0.0–0.2)

## 2022-01-21 LAB — CMP (CANCER CENTER ONLY)
ALT: 16 U/L (ref 0–44)
AST: 17 U/L (ref 15–41)
Albumin: 3.2 g/dL — ABNORMAL LOW (ref 3.5–5.0)
Alkaline Phosphatase: 100 U/L (ref 38–126)
Anion gap: 9 (ref 5–15)
BUN: 21 mg/dL (ref 8–23)
CO2: 25 mmol/L (ref 22–32)
Calcium: 8.6 mg/dL — ABNORMAL LOW (ref 8.9–10.3)
Chloride: 105 mmol/L (ref 98–111)
Creatinine: 0.72 mg/dL (ref 0.44–1.00)
GFR, Estimated: >60 mL/min (ref >60)
Glucose, Bld: 106 mg/dL — ABNORMAL HIGH (ref 70–99)
Potassium: 3.5 mmol/L (ref 3.5–5.1)
Sodium: 139 mmol/L (ref 135–145)
Total Bilirubin: 0.3 mg/dL (ref 0.3–1.2)
Total Protein: 6.3 g/dL — ABNORMAL LOW (ref 6.5–8.1)

## 2022-01-21 LAB — PREPARE RBC (CROSSMATCH)

## 2022-01-21 LAB — PREALBUMIN: Prealbumin: 19 mg/dL (ref 18–38)

## 2022-01-21 LAB — LACTATE DEHYDROGENASE: LDH: 339 U/L — ABNORMAL HIGH (ref 98–192)

## 2022-01-21 MED ORDER — HEPARIN SOD (PORK) LOCK FLUSH 100 UNIT/ML IV SOLN
500.0000 [IU] | Freq: Once | INTRAVENOUS | Status: DC
  Administered 2022-01-21: 500 [IU] via INTRAVENOUS

## 2022-01-21 MED ORDER — SODIUM CHLORIDE 0.9% FLUSH
10.0000 mL | Freq: Once | INTRAVENOUS | Status: AC
  Administered 2022-01-21: 10 mL via INTRAVENOUS

## 2022-01-21 NOTE — Addendum Note (Signed)
Addended by: San Morelle on: 01/21/2022 01:17 PM   Modules accepted: Orders

## 2022-01-21 NOTE — Progress Notes (Unsigned)
Patient is here for toxicity check after last last treatment. She has tolerated this regimen much better than the last. She will require a transfusion which will be scheduled for Monday. See will proceed with her next cycle as scheduled.   Oncology Nurse Navigator Documentation     01/21/2022   12:30 PM  Oncology Nurse Navigator Flowsheets  Navigator Follow Up Date: 02/01/2022  Navigator Follow Up Reason: Follow-up Appointment;Chemotherapy  Navigator Location [facility]  Navigator Encounter Type Follow-up Appt  Patient Visit Type MedOnc  Treatment Phase Active Tx  Barriers/Navigation Needs Coordination of Care;Education  Interventions None Required  Acuity Level 2-Minimal Needs (1-2 Barriers Identified)  Support Groups/Services Friends and Family  Time Spent with Patient 15

## 2022-01-21 NOTE — Progress Notes (Signed)
Hematology and Oncology Follow Up Visit  TEWANA BOHLEN [id number] Aug 11, 1950 71 y.o. 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 Banghart is back for follow-up.  She has tolerated the chemotherapy quite nicely.  We have her on single agent chemotherapy along with Avastin.  Unfortunately, the insurance company would not approve the olaparib immuno she has a tumor that is HRD (+).  Her last CA-125 was down to 760.  As such, treatment is working..  She does feel tired.  Her hemoglobin is 8.  We will plan to have to transfuse her.  I think 2 units of blood will certainly help her out.  She is losing some weight.  She is eating better.  Her albumin is up to 3.2.  She has some fluid taken off I think last week.  Only 1.3 L of fluid was removed.  Hopefully, we are starting to see the Avastin taking effect.  She has had no problems with diarrhea.  She has had no bleeding.  She has had no cough or shortness of breath.  Overall, I would have to say that her performance status is probably ECOG 2.    Medications:  Current Outpatient Medications:    atorvastatin (LIPITOR) 40 MG tablet, TAKE ONE TABLET BY MOUTH EVERY NIGHT AT BEDTIME, Disp: 90 tablet, Rfl: 3   Calcium-Vitamin D-Vitamin K (VIACTIV PO), Take by mouth See admin instructions. Chew 1 square by mouth once a day- for calcium, Disp: , Rfl:    cholecalciferol (VITAMIN D3) 25 MCG (1000 UNIT) tablet, Take 1,000 Units by mouth daily., Disp: , Rfl:    dexamethasone (DECADRON) 4 MG tablet, Take 2 tablets by mouth starting the day after chemotherapy for 3 days. Take with food., Disp: 30 tablet, Rfl: 1   megestrol (MEGACE) 400 MG/10ML suspension, Take 10 mLs (400 mg total) by mouth 2 (two) times daily., Disp: 240 mL,  Rfl: 0   midodrine (PROAMATINE) 10 MG tablet, Take 1 tablet (10 mg total) by mouth 3 (three) times daily with meals., Disp: 90 tablet, Rfl: 1   mirtazapine (REMERON SOL-TAB) 15 MG disintegrating tablet, Take 1 tablet (15 mg total) by mouth at bedtime., Disp: 30 tablet, Rfl: 0   Multiple Vitamin (MULTIVITAMIN WITH MINERALS) TABS tablet, Take 1 tablet by mouth daily., Disp: , Rfl:    Omega-3 Fatty Acids (FISH OIL MAXIMUM STRENGTH) 1200 MG CAPS, Take 1,200 mg by mouth See admin instructions. Take 1,200 mg by mouth two times a day twice weekly, Disp: , Rfl:    omeprazole (PRILOSEC) 20 MG capsule, Take 20 mg by mouth daily., Disp: , Rfl:    ondansetron (ZOFRAN) 8 MG tablet, Take 1 tablet (8 mg total) by mouth every 8 (eight) hours as needed for nausea or vomiting. Start on the third day after chemotherapy., Disp: 30 tablet, Rfl: 1   prochlorperazine (COMPAZINE) 10 MG tablet, Take 1 tablet (10 mg total) by mouth every 6 (six) hours as needed for nausea or vomiting., Disp: 30 tablet, Rfl: 1   venlafaxine (EFFEXOR) 37.5 MG tablet, Take 1 tablet (37.5 mg total) by mouth 2 (two) times daily with a meal., Disp: 60 tablet, Rfl: 0   diphenoxylate-atropine (LOMOTIL) 2.5-0.025 MG tablet, Take 1 tablet by mouth 4 (four) times daily as needed for diarrhea or  loose stools. (Patient not taking: Reported on 01/21/2022), Disp: 30 tablet, Rfl: 0   feeding supplement (ENSURE ENLIVE / ENSURE PLUS) LIQD, Take 237 mLs by mouth daily. (Patient not taking: Reported on 01/21/2022), Disp: 7110 mL, Rfl: 2   fiber supplement, BANATROL TF, liquid, Take 60 mLs by mouth 2 (two) times daily. (Patient not taking: Reported on 01/21/2022), Disp: , Rfl:    PRILOSEC OTC 20 MG tablet, Take 20 mg by mouth daily before breakfast. (Patient not taking: Reported on 01/21/2022), Disp: , Rfl:    senna-docusate (SENOKOT-S) 8.6-50 MG tablet, Take 1 tablet by mouth daily. (Patient not taking: Reported on 01/21/2022), Disp: , Rfl:   Allergies:   Allergies  Allergen Reactions   Fluvastatin Sodium Other (See Comments)    Myalgias    Past Medical History, Surgical history, Social history, and Family History were reviewed and updated.  Review of Systems: Review of Systems  Constitutional: Negative.   HENT:  Negative.    Eyes: Negative.   Respiratory: Negative.    Cardiovascular: Negative.   Gastrointestinal:  Positive for abdominal pain.  Genitourinary:  Positive for pelvic pain.   Musculoskeletal: Negative.   Skin: Negative.   Neurological: Negative.   Hematological: Negative.   Psychiatric/Behavioral: Negative.      Physical Exam:  vitals were not taken for this visit.   Wt Readings from Last 3 Encounters:  01/21/22 118 lb 0.3 oz (53.5 kg)  01/11/22 133 lb 6.4 oz (60.5 kg)  12/25/21 133 lb 9.6 oz (60.6 kg)    Physical Exam Vitals reviewed.  HENT:     Head: Normocephalic and atraumatic.  Eyes:     Pupils: Pupils are equal, round, and reactive to light.  Cardiovascular:     Rate and Rhythm: Normal rate and regular rhythm.     Heart sounds: Normal heart sounds.  Pulmonary:     Effort: Pulmonary effort is normal.     Breath sounds: Normal breath sounds.  Abdominal:     General: Bowel sounds are normal.     Palpations: Abdomen is soft.     Comments: Abdominal exam is slightly distended.  There is fluid wave.  There is little bit of tenderness throughout the abdominal cavity to palpation.  There is no abdominal mass.  There is no palpable liver or spleen tip.  Musculoskeletal:        General: No tenderness or deformity. Normal range of motion.     Cervical back: Normal range of motion.     Comments: She does have a little bit of swelling in the lower legs.  Lymphadenopathy:     Cervical: No cervical adenopathy.  Skin:    General: Skin is warm and dry.     Findings: No erythema or rash.  Neurological:     Mental Status: She is alert and oriented to person, place, and time.  Psychiatric:        Behavior:  Behavior normal.        Thought Content: Thought content normal.        Judgment: Judgment normal.      Lab Results  Component Value Date   WBC 8.7 01/21/2022   HGB 8.0 (L) 01/21/2022   HCT 25.6 (L) 01/21/2022   MCV 89.5 01/21/2022   PLT 218 01/21/2022     Chemistry      Component Value Date/Time   NA 135 01/11/2022 1027   K 4.2 01/11/2022 1027   CL 104 01/11/2022 1027   CO2 22 01/11/2022  1027   BUN 18 01/11/2022 1027   CREATININE 0.90 01/11/2022 1027   CREATININE 0.93 11/03/2021 0000      Component Value Date/Time   CALCIUM 8.7 (L) 01/11/2022 1027   ALKPHOS 108 01/11/2022 1027   AST 16 01/11/2022 1027   ALT 12 01/11/2022 1027   BILITOT 0.3 01/11/2022 1027      Impression and Plan: Ms. Broyles is a very charming 71 year old white female.  Looks like she has stage IIIC ovarian cancer.  She has a large right ovarian mass.  She clearly has done better with the single agent carboplatin.  You know she is anemic, her quality life seems to be doing better.  We will going to transfuse her on Monday.  I think this will help make her feel better.  I know she is worried about losing some weight.  I understand this.  Hopefully, she will begin to gain some weight as the cancer continues to respond.  We will go ahead with her third cycle of treatment.  Again we will just do carboplatinum/Avastin.  We will have her come back on October 24 for her third cycle of treatment.    Volanda Napoleon, MD 10/13/202312:40 PM

## 2022-01-22 LAB — CA 125: Cancer Antigen (CA) 125: 546 U/mL — ABNORMAL HIGH (ref 0.0–38.1)

## 2022-01-23 DIAGNOSIS — M810 Age-related osteoporosis without current pathological fracture: Secondary | ICD-10-CM | POA: Diagnosis not present

## 2022-01-23 DIAGNOSIS — I7300 Raynaud's syndrome without gangrene: Secondary | ICD-10-CM | POA: Diagnosis not present

## 2022-01-23 DIAGNOSIS — Z87891 Personal history of nicotine dependence: Secondary | ICD-10-CM | POA: Diagnosis not present

## 2022-01-23 DIAGNOSIS — R188 Other ascites: Secondary | ICD-10-CM | POA: Diagnosis not present

## 2022-01-23 DIAGNOSIS — E7800 Pure hypercholesterolemia, unspecified: Secondary | ICD-10-CM | POA: Diagnosis not present

## 2022-01-23 DIAGNOSIS — N179 Acute kidney failure, unspecified: Secondary | ICD-10-CM | POA: Diagnosis not present

## 2022-01-23 DIAGNOSIS — I10 Essential (primary) hypertension: Secondary | ICD-10-CM | POA: Diagnosis not present

## 2022-01-23 DIAGNOSIS — E440 Moderate protein-calorie malnutrition: Secondary | ICD-10-CM | POA: Diagnosis not present

## 2022-01-23 DIAGNOSIS — Z95828 Presence of other vascular implants and grafts: Secondary | ICD-10-CM | POA: Diagnosis not present

## 2022-01-23 DIAGNOSIS — K589 Irritable bowel syndrome without diarrhea: Secondary | ICD-10-CM | POA: Diagnosis not present

## 2022-01-23 DIAGNOSIS — J9601 Acute respiratory failure with hypoxia: Secondary | ICD-10-CM | POA: Diagnosis not present

## 2022-01-23 DIAGNOSIS — R1312 Dysphagia, oropharyngeal phase: Secondary | ICD-10-CM | POA: Diagnosis not present

## 2022-01-23 DIAGNOSIS — D709 Neutropenia, unspecified: Secondary | ICD-10-CM | POA: Diagnosis not present

## 2022-01-23 DIAGNOSIS — Z9181 History of falling: Secondary | ICD-10-CM | POA: Diagnosis not present

## 2022-01-23 DIAGNOSIS — Z6820 Body mass index (BMI) 20.0-20.9, adult: Secondary | ICD-10-CM | POA: Diagnosis not present

## 2022-01-24 ENCOUNTER — Encounter: Payer: Self-pay | Admitting: *Deleted

## 2022-01-24 ENCOUNTER — Inpatient Hospital Stay: Payer: Medicare Other | Admitting: Licensed Clinical Social Worker

## 2022-01-24 ENCOUNTER — Inpatient Hospital Stay: Payer: Medicare Other

## 2022-01-24 ENCOUNTER — Encounter: Payer: Self-pay | Admitting: Licensed Clinical Social Worker

## 2022-01-24 DIAGNOSIS — C561 Malignant neoplasm of right ovary: Secondary | ICD-10-CM

## 2022-01-24 DIAGNOSIS — J90 Pleural effusion, not elsewhere classified: Secondary | ICD-10-CM | POA: Diagnosis not present

## 2022-01-24 DIAGNOSIS — Z1379 Encounter for other screening for genetic and chromosomal anomalies: Secondary | ICD-10-CM | POA: Insufficient documentation

## 2022-01-24 DIAGNOSIS — Z803 Family history of malignant neoplasm of breast: Secondary | ICD-10-CM

## 2022-01-24 DIAGNOSIS — R188 Other ascites: Secondary | ICD-10-CM | POA: Diagnosis not present

## 2022-01-24 DIAGNOSIS — Z5111 Encounter for antineoplastic chemotherapy: Secondary | ICD-10-CM | POA: Diagnosis not present

## 2022-01-24 MED ORDER — SODIUM CHLORIDE 0.9% FLUSH
10.0000 mL | INTRAVENOUS | Status: AC | PRN
  Administered 2022-01-24: 10 mL

## 2022-01-24 MED ORDER — SODIUM CHLORIDE 0.9% IV SOLUTION
250.0000 mL | Freq: Once | INTRAVENOUS | Status: AC
  Administered 2022-01-24: 250 mL via INTRAVENOUS

## 2022-01-24 MED ORDER — FUROSEMIDE 10 MG/ML IJ SOLN
INTRAMUSCULAR | Status: AC
  Filled 2022-01-24: qty 4

## 2022-01-24 MED ORDER — HEPARIN SOD (PORK) LOCK FLUSH 100 UNIT/ML IV SOLN
500.0000 [IU] | Freq: Every day | INTRAVENOUS | Status: AC | PRN
  Administered 2022-01-24: 500 [IU]

## 2022-01-24 MED ORDER — FUROSEMIDE 10 MG/ML IJ SOLN
20.0000 mg | Freq: Once | INTRAMUSCULAR | Status: AC
  Administered 2022-01-24: 20 mg via INTRAVENOUS

## 2022-01-24 NOTE — Progress Notes (Signed)
Pt declined second dose of lasix prior to discharge.

## 2022-01-24 NOTE — Patient Instructions (Signed)
Blood Transfusion, Adult, Care After The following information offers guidance on how to care for yourself after your procedure. Your health care provider may also give you more specific instructions. If you have problems or questions, contact your health care provider. What can I expect after the procedure? After the procedure, it is common to have: Bruising and soreness where the IV was inserted. A headache. Follow these instructions at home: IV insertion site care     Follow instructions from your health care provider about how to take care of your IV insertion site. Make sure you: Wash your hands with soap and water for at least 20 seconds before and after you change your bandage (dressing). If soap and water are not available, use hand sanitizer. Change your dressing as told by your health care provider. Check your IV insertion site every day for signs of infection. Check for: Redness, swelling, or pain. Bleeding from the site. Warmth. Pus or a bad smell. General instructions Take over-the-counter and prescription medicines only as told by your health care provider. Rest as told by your health care provider. Return to your normal activities as told by your health care provider. Keep all follow-up visits. Lab tests may need to be done at certain periods to recheck your blood counts. Contact a health care provider if: You have itching or red, swollen areas of skin (hives). You have a fever or chills. You have pain in the head, back, or chest. You feel anxious or you feel weak after doing your normal activities. You have redness, swelling, warmth, or pain around the IV insertion site. You have blood coming from the IV insertion site that does not stop with pressure. You have pus or a bad smell coming from your IV insertion site. If you received your blood transfusion in an outpatient setting, you will be told whom to contact to report any reactions. Get help right away if: You  have symptoms of a serious allergic or immune system reaction, including: Trouble breathing or shortness of breath. Swelling of the face, feeling flushed, or widespread rash. Dark urine or blood in the urine. Fast heartbeat. These symptoms may be an emergency. Get help right away. Call 911. Do not wait to see if the symptoms will go away. Do not drive yourself to the hospital. Summary Bruising and soreness around the IV insertion site are common. Check your IV insertion site every day for signs of infection. Rest as told by your health care provider. Return to your normal activities as told by your health care provider. Get help right away for symptoms of a serious allergic or immune system reaction to the blood transfusion. This information is not intended to replace advice given to you by your health care provider. Make sure you discuss any questions you have with your health care provider. Document Revised: 06/25/2021 Document Reviewed: 06/25/2021 Elsevier Patient Education  2023 Elsevier Inc.  

## 2022-01-24 NOTE — Progress Notes (Signed)
REFERRING PROVIDER: [name], MD Maria Romero,  [place] [id number]  PRIMARY PROVIDER:  Hali Marry, MD  PRIMARY REASON FOR VISIT:  1. [name], right ([place])   2. Family history of breast cancer    I connected with Maria Romero on 01/24/2022 at 10:00 AM EDT by MyChart video conference and verified that I am speaking with the correct person using two identifiers.    Patient location: [facility] Provider location: [place]:   Maria Romero, a 71 y.o. female, was seen for a [place] cancer genetics consultation due to a personal history of ovarian cancer.  Maria Romero presents to clinic today to discuss the possibility of a hereditary predisposition to cancer, genetic testing, and to further clarify her future cancer risks, as well as potential cancer risks for family members.   In 2023, at the age of 35, Maria Romero was diagnosed with right ovarian cancer. This is being treated with neoadjuvant chemotherapy. HRD testing came back positive.   CANCER HISTORY:  Oncology History  [name], right ([place])  12/01/2021 Initial Diagnosis   [name], right ([place])   12/01/2021 Cancer Staging   Staging form: Ovary, Fallopian Tube, and Primary Peritoneal Carcinoma, AJCC 8th Edition - Clinical stage from 12/01/2021: FIGO Stage IIIC (cT3c, cN1b, cM0) - Signed by Volanda Napoleon, MD on 12/01/2021 Stage prefix: Initial diagnosis Histologic grade (G): G2 Histologic grading system: 4 grade system   12/07/2021 -  Chemotherapy   Patient is on Treatment Plan : OVARIAN Carboplatin (AUC 6) + Paclitaxel (175) q21d X 6 Cycles     01/11/2022 -  Chemotherapy   Patient is on Treatment Plan : OVARIAN Bevacizumab q21d       Past Medical History:  Diagnosis Date   Goals of care, counseling/discussion 12/01/2021   Hypercholesterolemia    Hypertension    IBS (irritable bowel syndrome)    Malignant ascites 11/19/2021    Osteopenia    Osteoporosis    [name], right ([place]) 12/01/2021   Ovarian mass, right 11/19/2021    Past Surgical History:  Procedure Laterality Date   IR IMAGING GUIDED PORT INSERTION  11/26/2021   IR PARACENTESIS  11/23/2021   IR PARACENTESIS  11/26/2021   lump removal  04/11/1993   axilla right - benign   TONSILLECTOMY     TUBAL LIGATION  04/11/1988    FAMILY HISTORY:  We obtained a detailed, 4-generation family history.  Significant diagnoses are listed below: Family History  Problem Relation Age of Onset   Stroke Mother    Hypertension Mother    Hyperlipidemia Mother    Glaucoma Mother    Heart attack Father 28   Diabetes Father    Hypertension Father    Hyperlipidemia Father    Cataracts Father    Breast cancer Cousin    Colon cancer Neg Hx    Ovarian cancer Neg Hx    Endometrial cancer Neg Hx    Pancreatic cancer Neg Hx    Prostate cancer Neg Hx    Maria Romero has 1 daughter, Maria Romero, 63. She does not have siblings.  Maria Romero's mother passed at 52. A maternal cousin had breast cancer. No other known cancers on this side of the family.  Maria Romero's father passed at 20. A paternal uncle had lung cancer. No other known cancers on this side of the family.  Maria Romero is unaware of previous family history of  genetic testing for hereditary cancer risks. There is no reported Ashkenazi Jewish ancestry. There is no known consanguinity.    GENETIC COUNSELING ASSESSMENT: Maria Romero is a 71 y.o. female with a personal history of ovarian cancer which is somewhat suggestive of a hereditary cancer syndrome and predisposition to cancer. We, therefore, discussed and recommended the following at today's visit.   DISCUSSION: We discussed that approximately 10-20% of ovarian cancer is hereditary. Most cases of hereditary ovarian cancer are associated with BRCA1/BRCA2 genes, although there are other genes associated with hereditary cancer as well. Cancers and risks are gene  specific. We discussed that testing is beneficial for several reasons including knowing about cancer risks, identifying potential screening and risk-reduction options that may be appropriate, and to understand if other family members could be at risk for cancer and allow them to undergo genetic testing.   We reviewed the characteristics, features and inheritance patterns of hereditary cancer syndromes. We also discussed genetic testing, including the appropriate family members to test, the process of testing, insurance coverage and turn-around-time for results. We discussed the implications of a negative, positive and/or variant of uncertain significant result. We recommended Maria Romero pursue genetic testing for the [facility] Common Hereditary Cancers+RNA gene panel. She had blood drawn and sent to [facility] for this test on 01/11/2022.  GENETIC TEST RESULTS:  The [employer] Common Hereditary Cancers+RNA Panel found no pathogenic mutations.   The Common Hereditary Cancers Panel + RNA offered by [facility] includes sequencing and/or deletion duplication testing of the following 47 genes: APC, ATM, AXIN2, BARD1, BMPR1A, BRCA1, BRCA2, BRIP1, CDH1, CDKN2A (p14ARF), CDKN2A (p16INK4a), CKD4, CHEK2, CTNNA1, DICER1, EPCAM (Deletion/duplication testing only), GREM1 (promoter region deletion/duplication testing only), KIT, MEN1, MLH1, MSH2, MSH3, MSH6, MUTYH, NBN, NF1, NHTL1, PALB2, PDGFRA, PMS2, POLD1, POLE, PTEN, RAD50, RAD51C, RAD51D, SDHB, SDHC, SDHD, SMAD4, SMARCA4. STK11, TP53, TSC1, TSC2, and VHL.  The following genes were evaluated for sequence changes only: SDHA and HOXB13 c.251G>A variant only.   The test report has been scanned into EPIC and is located under the Molecular Pathology section of the Results Review tab.  A portion of the result report is included below for reference. Genetic testing reported out on 01/24/2022.      Even though a pathogenic variant was not identified, possible explanations for the  cancer in the family may include: There may be no hereditary risk for cancer in the family. The cancers in Ms. Romero and/or her family may be sporadic/familial or due to other genetic and environmental factors. There may be a gene mutation in one of these genes that current testing methods cannot detect but that chance is small. There could be another gene that has not yet been discovered, or that we have not yet tested, that is responsible for the cancer diagnoses in the family.  It is also possible there is a hereditary cause for the cancer in the family that Maria Romero did not inherit.  Therefore, it is important to remain in touch with cancer genetics in the future so that we can continue to offer Maria Romero the most up to date genetic testing.   ADDITIONAL GENETIC TESTING:  We discussed with Maria Romero that her genetic testing was fairly extensive.  If there are additional relevant genes identified to increase cancer risk that can be analyzed in the future, we would be happy to discuss and coordinate this testing at that time.    CANCER SCREENING RECOMMENDATIONS:  Maria Romero's test result is considered negative (normal).  This means that we have not identified a hereditary cause for her personal history of cancer at this time.   An individual's cancer risk and medical management are not determined by genetic test results alone. Overall cancer risk assessment incorporates additional factors, including personal medical history, family history, and any available genetic information that may result in a personalized plan for cancer prevention and surveillance. Therefore, it is recommended she continue to follow the cancer management and screening guidelines provided by her oncology and primary healthcare provider.  RECOMMENDATIONS FOR FAMILY MEMBERS:   Since she did not inherit a identifiable mutation in a cancer predisposition gene included on this panel, her children could not have  inherited a known mutation from her in one of these genes. Individuals in this family might be at some increased risk of developing cancer, over the general population risk, due to the family history of cancer.  Individuals in the family should notify their providers of the family history of cancer. We recommend women in this family have a yearly mammogram beginning at age 60, or 67 years younger than the earliest onset of cancer, an annual clinical breast exam, and perform monthly breast self-exams.  Family members should have colonoscopies by at age 71, or earlier, as recommended by their providers.  FOLLOW-UP:  Lastly, we discussed with Maria Romero that cancer genetics is a rapidly advancing field and it is possible that new genetic tests will be appropriate for her and/or her family members in the future. We encouraged her to remain in contact with cancer genetics on an annual basis so we can update her personal and family histories and let her know of advances in cancer genetics that may benefit this family.   Maria Romero's questions were answered to her satisfaction today. Our contact information was provided should additional questions or concerns arise. Thank you for the referral and allowing [place] to share in the care of your patient.   Faith Rogue, MS, [facility] Genetic Counselor [email or web address] Phone: [phone]  The patient was seen for a total of 12 minutes in virtual genetic counseling. Patient's daughter was present via video as well. Dr. Grayland Ormond was available for discussion regarding this case.   _______________________________________________________________________ For Office Staff:  Number of people involved in session: 2 Was an Intern/ student involved with case: no

## 2022-01-25 ENCOUNTER — Encounter: Payer: Self-pay | Admitting: Hematology & Oncology

## 2022-01-25 LAB — TYPE AND SCREEN
ABO/RH(D): A NEG
Antibody Screen: NEGATIVE
Unit division: 0
Unit division: 0

## 2022-01-25 LAB — BPAM RBC
Blood Product Expiration Date: 202311082359
Blood Product Expiration Date: 202311082359
ISSUE DATE / TIME: 202310160814
ISSUE DATE / TIME: 202310160814
Unit Type and Rh: 600
Unit Type and Rh: 600

## 2022-01-26 ENCOUNTER — Other Ambulatory Visit: Payer: Self-pay | Admitting: *Deleted

## 2022-01-26 NOTE — Patient Outreach (Signed)
[facility] Post- Acute Care Coordinator follow up. Mrs. Pucillo discharged from [facility] on 01/20/22. [facility] home health was arranged.   No identifiable [facility] care coordination needs.  Marthenia Rolling, MSN, RN,BSN [facility] Acute Care Coordinator [phone] (Direct dial)

## 2022-01-27 DIAGNOSIS — I10 Essential (primary) hypertension: Secondary | ICD-10-CM | POA: Diagnosis not present

## 2022-01-27 DIAGNOSIS — I7300 Raynaud's syndrome without gangrene: Secondary | ICD-10-CM | POA: Diagnosis not present

## 2022-01-27 DIAGNOSIS — J9601 Acute respiratory failure with hypoxia: Secondary | ICD-10-CM | POA: Diagnosis not present

## 2022-01-27 DIAGNOSIS — R188 Other ascites: Secondary | ICD-10-CM | POA: Diagnosis not present

## 2022-01-27 DIAGNOSIS — Z87891 Personal history of nicotine dependence: Secondary | ICD-10-CM | POA: Diagnosis not present

## 2022-01-27 DIAGNOSIS — K589 Irritable bowel syndrome without diarrhea: Secondary | ICD-10-CM | POA: Diagnosis not present

## 2022-01-27 DIAGNOSIS — M810 Age-related osteoporosis without current pathological fracture: Secondary | ICD-10-CM | POA: Diagnosis not present

## 2022-01-27 DIAGNOSIS — Z95828 Presence of other vascular implants and grafts: Secondary | ICD-10-CM | POA: Diagnosis not present

## 2022-01-27 DIAGNOSIS — Z6820 Body mass index (BMI) 20.0-20.9, adult: Secondary | ICD-10-CM | POA: Diagnosis not present

## 2022-01-27 DIAGNOSIS — R1312 Dysphagia, oropharyngeal phase: Secondary | ICD-10-CM | POA: Diagnosis not present

## 2022-01-27 DIAGNOSIS — N179 Acute kidney failure, unspecified: Secondary | ICD-10-CM | POA: Diagnosis not present

## 2022-01-27 DIAGNOSIS — E7800 Pure hypercholesterolemia, unspecified: Secondary | ICD-10-CM | POA: Diagnosis not present

## 2022-01-27 DIAGNOSIS — E440 Moderate protein-calorie malnutrition: Secondary | ICD-10-CM | POA: Diagnosis not present

## 2022-01-27 DIAGNOSIS — D709 Neutropenia, unspecified: Secondary | ICD-10-CM | POA: Diagnosis not present

## 2022-01-27 DIAGNOSIS — Z9181 History of falling: Secondary | ICD-10-CM | POA: Diagnosis not present

## 2022-01-31 ENCOUNTER — Telehealth: Payer: Self-pay | Admitting: *Deleted

## 2022-01-31 NOTE — Telephone Encounter (Signed)
LVM for Verbal Orders for  PT

## 2022-02-01 ENCOUNTER — Inpatient Hospital Stay: Payer: Medicare Other

## 2022-02-01 ENCOUNTER — Encounter: Payer: Self-pay | Admitting: *Deleted

## 2022-02-01 ENCOUNTER — Encounter: Payer: Self-pay | Admitting: Medical Oncology

## 2022-02-01 ENCOUNTER — Inpatient Hospital Stay: Payer: Medicare Other | Admitting: Medical Oncology

## 2022-02-01 VITALS — BP 145/87 | HR 104 | Temp 97.8°F | Resp 17 | Wt 126.0 lb

## 2022-02-01 VITALS — BP 135/74 | HR 98

## 2022-02-01 DIAGNOSIS — Z5111 Encounter for antineoplastic chemotherapy: Secondary | ICD-10-CM | POA: Diagnosis not present

## 2022-02-01 DIAGNOSIS — R188 Other ascites: Secondary | ICD-10-CM | POA: Diagnosis not present

## 2022-02-01 DIAGNOSIS — C561 Malignant neoplasm of right ovary: Secondary | ICD-10-CM

## 2022-02-01 DIAGNOSIS — J90 Pleural effusion, not elsewhere classified: Secondary | ICD-10-CM | POA: Diagnosis not present

## 2022-02-01 DIAGNOSIS — D6481 Anemia due to antineoplastic chemotherapy: Secondary | ICD-10-CM

## 2022-02-01 DIAGNOSIS — D702 Other drug-induced agranulocytosis: Secondary | ICD-10-CM

## 2022-02-01 LAB — CMP (CANCER CENTER ONLY)
ALT: 13 U/L (ref 0–44)
AST: 15 U/L (ref 15–41)
Albumin: 3.5 g/dL (ref 3.5–5.0)
Alkaline Phosphatase: 112 U/L (ref 38–126)
Anion gap: 11 (ref 5–15)
BUN: 14 mg/dL (ref 8–23)
CO2: 23 mmol/L (ref 22–32)
Calcium: 8.8 mg/dL — ABNORMAL LOW (ref 8.9–10.3)
Chloride: 107 mmol/L (ref 98–111)
Creatinine: 0.77 mg/dL (ref 0.44–1.00)
GFR, Estimated: >60 mL/min (ref >60)
Glucose, Bld: 124 mg/dL — ABNORMAL HIGH (ref 70–99)
Potassium: 3.5 mmol/L (ref 3.5–5.1)
Sodium: 141 mmol/L (ref 135–145)
Total Bilirubin: 0.3 mg/dL (ref 0.3–1.2)
Total Protein: 6.5 g/dL (ref 6.5–8.1)

## 2022-02-01 LAB — CBC WITH DIFFERENTIAL (CANCER CENTER ONLY)
Abs Immature Granulocytes: 0.02 10*3/uL (ref 0.00–0.07)
Basophils Absolute: 0 10*3/uL (ref 0.0–0.1)
Basophils Relative: 0 %
Eosinophils Absolute: 0 10*3/uL (ref 0.0–0.5)
Eosinophils Relative: 1 %
HCT: 36.2 % (ref 36.0–46.0)
Hemoglobin: 11.4 g/dL — ABNORMAL LOW (ref 12.0–15.0)
Immature Granulocytes: 1 %
Lymphocytes Relative: 39 %
Lymphs Abs: 1.6 10*3/uL (ref 0.7–4.0)
MCH: 28.1 pg (ref 26.0–34.0)
MCHC: 31.5 g/dL (ref 30.0–36.0)
MCV: 89.4 fL (ref 80.0–100.0)
Monocytes Absolute: 0.4 10*3/uL (ref 0.1–1.0)
Monocytes Relative: 9 %
Neutro Abs: 2.1 10*3/uL (ref 1.7–7.7)
Neutrophils Relative %: 50 %
Platelet Count: 558 10*3/uL — ABNORMAL HIGH (ref 150–400)
RBC: 4.05 MIL/uL (ref 3.87–5.11)
RDW: 18.4 % — ABNORMAL HIGH (ref 11.5–15.5)
WBC Count: 4.2 10*3/uL (ref 4.0–10.5)
nRBC: 0 % (ref 0.0–0.2)

## 2022-02-01 LAB — PREALBUMIN: Prealbumin: 20 mg/dL (ref 18–38)

## 2022-02-01 LAB — RETICULOCYTES
Immature Retic Fract: 15.3 % (ref 2.3–15.9)
RBC.: 4.02 MIL/uL (ref 3.87–5.11)
Retic Count, Absolute: 76.4 10*3/uL (ref 19.0–186.0)
Retic Ct Pct: 1.9 % (ref 0.4–3.1)

## 2022-02-01 LAB — IRON AND IRON BINDING CAPACITY (CC-WL,HP ONLY)
Iron: 46 ug/dL (ref 28–170)
Saturation Ratios: 21 % (ref 10.4–31.8)
TIBC: 220 ug/dL — ABNORMAL LOW (ref 250–450)
UIBC: 174 ug/dL (ref 148–442)

## 2022-02-01 LAB — SAMPLE TO BLOOD BANK

## 2022-02-01 LAB — FERRITIN: Ferritin: 1219 ng/mL — ABNORMAL HIGH (ref 11–307)

## 2022-02-01 LAB — LACTATE DEHYDROGENASE: LDH: 261 U/L — ABNORMAL HIGH (ref 98–192)

## 2022-02-01 MED ORDER — SODIUM CHLORIDE 0.9 % IV SOLN
150.0000 mg | Freq: Once | INTRAVENOUS | Status: AC
  Administered 2022-02-01: 150 mg via INTRAVENOUS
  Filled 2022-02-01: qty 150

## 2022-02-01 MED ORDER — NYSTATIN 100000 UNIT/ML MT SUSP: 5.0000 mL | Freq: Four times a day (QID) | OROMUCOSAL | 1 refills | Status: DC | PRN

## 2022-02-01 MED ORDER — SODIUM CHLORIDE 0.9 % IV SOLN: Freq: Once | INTRAVENOUS | Status: AC

## 2022-02-01 MED ORDER — PALONOSETRON HCL INJECTION 0.25 MG/5ML
0.2500 mg | Freq: Once | INTRAVENOUS | Status: AC
  Administered 2022-02-01: 0.25 mg via INTRAVENOUS
  Filled 2022-02-01: qty 5

## 2022-02-01 MED ORDER — MEGESTROL ACETATE 400 MG/10ML PO SUSP: 400.0000 mg | Freq: Two times a day (BID) | ORAL | 0 refills | Status: DC

## 2022-02-01 MED ORDER — SODIUM CHLORIDE 0.9% FLUSH
10.0000 mL | INTRAVENOUS | Status: DC | PRN
  Administered 2022-02-01: 10 mL

## 2022-02-01 MED ORDER — DEXAMETHASONE 4 MG PO TABS: ORAL_TABLET | ORAL | 1 refills | Status: DC

## 2022-02-01 MED ORDER — SODIUM CHLORIDE 0.9 % IV SOLN
10.0000 mg | Freq: Once | INTRAVENOUS | Status: AC
  Administered 2022-02-01: 10 mg via INTRAVENOUS
  Filled 2022-02-01: qty 10

## 2022-02-01 MED ORDER — MIDODRINE HCL 10 MG PO TABS: 10.0000 mg | ORAL_TABLET | Freq: Three times a day (TID) | ORAL | 1 refills | Status: DC

## 2022-02-01 MED ORDER — OMEPRAZOLE 20 MG PO CPDR: 20.0000 mg | DELAYED_RELEASE_CAPSULE | Freq: Every day | ORAL | 2 refills | Status: DC

## 2022-02-01 MED ORDER — SODIUM CHLORIDE 0.9 % IV SOLN
385.0000 mg | Freq: Once | INTRAVENOUS | Status: AC
  Administered 2022-02-01: 390 mg via INTRAVENOUS
  Filled 2022-02-01: qty 39

## 2022-02-01 MED ORDER — HEPARIN SOD (PORK) LOCK FLUSH 100 UNIT/ML IV SOLN
500.0000 [IU] | Freq: Once | INTRAVENOUS | Status: AC | PRN
  Administered 2022-02-01: 500 [IU]

## 2022-02-01 MED ORDER — SODIUM CHLORIDE 0.9 % IV SOLN
15.0000 mg/kg | Freq: Once | INTRAVENOUS | Status: AC
  Administered 2022-02-01: 900 mg via INTRAVENOUS
  Filled 2022-02-01: qty 32

## 2022-02-01 MED ORDER — ONDANSETRON HCL 8 MG PO TABS: 8.0000 mg | ORAL_TABLET | Freq: Three times a day (TID) | ORAL | 1 refills | Status: DC | PRN

## 2022-02-01 MED ORDER — PROCHLORPERAZINE MALEATE 10 MG PO TABS: 10.0000 mg | ORAL_TABLET | Freq: Four times a day (QID) | ORAL | 1 refills | Status: DC | PRN

## 2022-02-01 NOTE — Progress Notes (Signed)
Hematology and Oncology Follow Up Visit  Maria Romero [id number] 1950-12-29 71 y.o. 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 Unfortunately, the insurance company would not approve the olaparib immuno she has a tumor that is HRD (+).  She reports that she tolerated the single agent chemotherapy along with Avastin really well. She would like to continue with this treatment. She is feeling much better since her blood transfusion as well. More energy, less SOB. Appetite is good- weight is up!  Overall, I would have to say that her performance status is probably ECOG 2. Wt Readings from Last 3 Encounters:  02/01/22 126 lb 0.6 oz (57.2 kg)  01/21/22 118 lb 0.3 oz (53.5 kg)  01/11/22 133 lb 6.4 oz (60.5 kg)    Medications:  Current Outpatient Medications:    atorvastatin (LIPITOR) 40 MG tablet, TAKE ONE TABLET BY MOUTH EVERY NIGHT AT BEDTIME, Disp: 90 tablet, Rfl: 3   Calcium-Vitamin D-Vitamin K (VIACTIV PO), Take by mouth See admin instructions. Chew 1 square by mouth once a day- for calcium, Disp: , Rfl:    cholecalciferol (VITAMIN D3) 25 MCG (1000 UNIT) tablet, Take 1,000 Units by mouth daily., Disp: , Rfl:    fiber supplement, BANATROL TF, liquid, Take 60 mLs by mouth 2 (two) times daily., Disp: , Rfl:    magic mouthwash (nystatin, lidocaine, diphenhydrAMINE, alum & mag hydroxide) suspension, Swish and spit 5 mLs 4 (four) times daily as needed for mouth pain., Disp: 240 mL, Rfl: 1   Multiple Vitamin (MULTIVITAMIN WITH MINERALS) TABS tablet, Take 1 tablet by mouth daily., Disp: , Rfl:    Omega-3 Fatty Acids (FISH OIL MAXIMUM  STRENGTH) 1200 MG CAPS, Take 1,200 mg by mouth See admin instructions. Take 1,200 mg by mouth two times a day twice weekly, Disp: , Rfl:    venlafaxine (EFFEXOR) 37.5 MG tablet, Take 1 tablet (37.5 mg total) by mouth 2 (two) times daily with a meal., Disp: 60 tablet, Rfl: 0   dexamethasone (DECADRON) 4 MG tablet, Take 2 tablets by mouth starting the day after chemotherapy for 3 days. Take with food., Disp: 30 tablet, Rfl: 1   megestrol (MEGACE) 400 MG/10ML suspension, Take 10 mLs (400 mg total) by mouth 2 (two) times daily., Disp: 240 mL, Rfl: 0   midodrine (PROAMATINE) 10 MG tablet, Take 1 tablet (10 mg total) by mouth 3 (three) times daily with meals., Disp: 90 tablet, Rfl: 1   omeprazole (PRILOSEC) 20 MG capsule, Take 1 capsule (20 mg total) by mouth daily., Disp: 90 capsule, Rfl: 2   ondansetron (ZOFRAN) 8 MG tablet, Take 1 tablet (8 mg total) by mouth every 8 (eight) hours as needed for nausea or vomiting. Start on the third day after chemotherapy., Disp: 30 tablet, Rfl: 1   PRILOSEC OTC 20 MG tablet, Take 20 mg by mouth daily before breakfast. (Patient not taking: Reported on 01/21/2022), Disp: , Rfl:    prochlorperazine (COMPAZINE) 10 MG tablet, Take 1 tablet (10 mg total) by mouth every 6 (six) hours as needed for nausea or vomiting., Disp: 30 tablet, Rfl: 1 No current facility-administered medications for this visit.  Facility-Administered Medications Ordered  in Other Visits:    bevacizumab-bvzr (ZIRABEV) 900 mg in sodium chloride 0.9 % 100 mL chemo infusion, 15 mg/kg (Treatment Plan Recorded), Intravenous, Once, Ennever, Rudell Cobb, MD   CARBOplatin (PARAPLATIN) 390 mg in sodium chloride 0.9 % 100 mL chemo infusion, 390 mg, Intravenous, Once, Ennever, Rudell Cobb, MD   dexamethasone (DECADRON) 10 mg in sodium chloride 0.9 % 50 mL IVPB, 10 mg, Intravenous, Once, Ennever, Rudell Cobb, MD   fosaprepitant (EMEND) 150 mg in sodium chloride 0.9 % 145 mL IVPB, 150 mg, Intravenous, Once, Ennever, Rudell Cobb, MD    heparin lock flush 100 unit/mL, 500 Units, Intracatheter, Once PRN, Ennever, Rudell Cobb, MD   sodium chloride flush (NS) 0.9 % injection 10 mL, 10 mL, Intracatheter, PRN, Volanda Napoleon, MD  Allergies:  Allergies  Allergen Reactions   Fluvastatin Sodium Other (See Comments)    Myalgias    Past Medical History, Surgical history, Social history, and Family History were reviewed and updated.  Review of Systems: Review of Systems  Constitutional: Negative.   HENT:  Negative.    Eyes: Negative.   Respiratory: Negative.    Cardiovascular: Negative.   Gastrointestinal:  Negative for abdominal pain.  Genitourinary:  Negative for pelvic pain.   Musculoskeletal: Negative.   Skin: Negative.   Neurological: Negative.   Hematological: Negative.   Psychiatric/Behavioral: Negative.      Physical Exam:  weight is 126 lb 0.6 oz (57.2 kg). Her oral temperature is 97.8 F (36.6 C). Her blood pressure is 145/87 (abnormal) and her pulse is 104 (abnormal). Her respiration is 17 and oxygen saturation is 99%.   Wt Readings from Last 3 Encounters:  02/01/22 126 lb 0.6 oz (57.2 kg)  01/21/22 118 lb 0.3 oz (53.5 kg)  01/11/22 133 lb 6.4 oz (60.5 kg)    Physical Exam Vitals reviewed.  HENT:     Head: Normocephalic and atraumatic.  Eyes:     Pupils: Pupils are equal, round, and reactive to light.  Cardiovascular:     Rate and Rhythm: Normal rate and regular rhythm.     Heart sounds: Normal heart sounds.  Pulmonary:     Effort: Pulmonary effort is normal.     Breath sounds: Normal breath sounds.  Abdominal:     General: Bowel sounds are normal.     Palpations: Abdomen is soft.     Comments: Abdominal exam is scantly distended  Musculoskeletal:        General: No tenderness or deformity. Normal range of motion.     Cervical back: Normal range of motion.  Lymphadenopathy:     Cervical: No cervical adenopathy.  Skin:    General: Skin is warm and dry.     Findings: No erythema or rash.   Neurological:     Mental Status: She is alert and oriented to person, place, and time.  Psychiatric:        Behavior: Behavior normal.        Thought Content: Thought content normal.        Judgment: Judgment normal.      Lab Results  Component Value Date   WBC 4.2 02/01/2022   HGB 11.4 (L) 02/01/2022   HCT 36.2 02/01/2022   MCV 89.4 02/01/2022   PLT 558 (H) 02/01/2022     Chemistry      Component Value Date/Time   NA 141 02/01/2022 0839   K 3.5 02/01/2022 0839   CL 107 02/01/2022 0839   CO2 23 02/01/2022 0839  BUN 14 02/01/2022 0839   CREATININE 0.77 02/01/2022 0839   CREATININE 0.93 11/03/2021 0000      Component Value Date/Time   CALCIUM 8.8 (L) 02/01/2022 0839   ALKPHOS 112 02/01/2022 0839   AST 15 02/01/2022 0839   ALT 13 02/01/2022 0839   BILITOT 0.3 02/01/2022 0839      Impression and Plan: Ms. Moncure is a very charming 71 year old white female.  Looks like she has stage IIIC ovarian cancer.  She has a large right ovarian mass.  She is doing really well now. Tolerating her single agent Carboplatin and avastin well. She wishes to continue this treatment today if possible. Review of her labs show significant improvement in her Hgb. No bleeding or fevers. She is cleared for treatment today: Cycle 3 Day 1.   Disposition:  Cycle 3 Day 1 carboplatinum/Avastin today RTC 3 weeks MD, labs, Cycle 4 Day 1 carboplatinum/Avastin    Hughie Closs, PA-C 10/24/202310:47 AM

## 2022-02-01 NOTE — Progress Notes (Unsigned)
Patient is doing significantly better with this new regimen. She is gaining weight. Her side effects are manageable. She has been discharged from her rehab facility and is back home. She has not required a paracentesis in the last two weeks and doesn't feel distended today. She will receive cycle three today (cycle two of her modified regime)   Oncology Nurse Navigator Documentation     02/01/2022   10:00 AM  Oncology Nurse Navigator Flowsheets  Navigator Follow Up Date: 02/24/2022  Navigator Follow Up Reason: Follow-up Appointment;Chemotherapy  Navigator Location [facility]  Navigator Encounter Type Treatment;Appt/Treatment Plan Review  Patient Visit Type MedOnc  Treatment Phase Active Tx  Barriers/Navigation Needs Coordination of Care;Education  Interventions Psycho-Social Support  Acuity Level 2-Minimal Needs (1-2 Barriers Identified)  Support Groups/Services Friends and Family  Time Spent with Patient 15

## 2022-02-01 NOTE — Patient Instructions (Signed)

## 2022-02-01 NOTE — Addendum Note (Signed)
Addended by: Nelwyn Salisbury on: 02/01/2022 12:30 PM   Modules accepted: Orders

## 2022-02-02 ENCOUNTER — Inpatient Hospital Stay: Payer: Medicare Other

## 2022-02-02 ENCOUNTER — Other Ambulatory Visit: Payer: Self-pay

## 2022-02-02 DIAGNOSIS — M810 Age-related osteoporosis without current pathological fracture: Secondary | ICD-10-CM | POA: Diagnosis not present

## 2022-02-02 DIAGNOSIS — Z6820 Body mass index (BMI) 20.0-20.9, adult: Secondary | ICD-10-CM | POA: Diagnosis not present

## 2022-02-02 DIAGNOSIS — J9601 Acute respiratory failure with hypoxia: Secondary | ICD-10-CM | POA: Diagnosis not present

## 2022-02-02 DIAGNOSIS — Z87891 Personal history of nicotine dependence: Secondary | ICD-10-CM | POA: Diagnosis not present

## 2022-02-02 DIAGNOSIS — Z9181 History of falling: Secondary | ICD-10-CM | POA: Diagnosis not present

## 2022-02-02 DIAGNOSIS — I7300 Raynaud's syndrome without gangrene: Secondary | ICD-10-CM | POA: Diagnosis not present

## 2022-02-02 DIAGNOSIS — D709 Neutropenia, unspecified: Secondary | ICD-10-CM | POA: Diagnosis not present

## 2022-02-02 DIAGNOSIS — N179 Acute kidney failure, unspecified: Secondary | ICD-10-CM | POA: Diagnosis not present

## 2022-02-02 DIAGNOSIS — E440 Moderate protein-calorie malnutrition: Secondary | ICD-10-CM | POA: Diagnosis not present

## 2022-02-02 DIAGNOSIS — I10 Essential (primary) hypertension: Secondary | ICD-10-CM | POA: Diagnosis not present

## 2022-02-02 DIAGNOSIS — E7800 Pure hypercholesterolemia, unspecified: Secondary | ICD-10-CM | POA: Diagnosis not present

## 2022-02-02 DIAGNOSIS — Z95828 Presence of other vascular implants and grafts: Secondary | ICD-10-CM | POA: Diagnosis not present

## 2022-02-02 DIAGNOSIS — R1312 Dysphagia, oropharyngeal phase: Secondary | ICD-10-CM | POA: Diagnosis not present

## 2022-02-02 DIAGNOSIS — R188 Other ascites: Secondary | ICD-10-CM | POA: Diagnosis not present

## 2022-02-02 DIAGNOSIS — K589 Irritable bowel syndrome without diarrhea: Secondary | ICD-10-CM | POA: Diagnosis not present

## 2022-02-02 LAB — CA 125: Cancer Antigen (CA) 125: 550 U/mL — ABNORMAL HIGH (ref 0.0–38.1)

## 2022-02-03 ENCOUNTER — Encounter: Payer: Self-pay | Admitting: *Deleted

## 2022-02-03 ENCOUNTER — Ambulatory Visit: Payer: Medicare Other | Admitting: Family Medicine

## 2022-02-03 ENCOUNTER — Encounter: Payer: Self-pay | Admitting: Hematology & Oncology

## 2022-02-03 DIAGNOSIS — M810 Age-related osteoporosis without current pathological fracture: Secondary | ICD-10-CM | POA: Diagnosis not present

## 2022-02-03 DIAGNOSIS — Z95828 Presence of other vascular implants and grafts: Secondary | ICD-10-CM | POA: Diagnosis not present

## 2022-02-03 DIAGNOSIS — E7800 Pure hypercholesterolemia, unspecified: Secondary | ICD-10-CM | POA: Diagnosis not present

## 2022-02-03 DIAGNOSIS — Z9181 History of falling: Secondary | ICD-10-CM | POA: Diagnosis not present

## 2022-02-03 DIAGNOSIS — K589 Irritable bowel syndrome without diarrhea: Secondary | ICD-10-CM | POA: Diagnosis not present

## 2022-02-03 DIAGNOSIS — I10 Essential (primary) hypertension: Secondary | ICD-10-CM | POA: Diagnosis not present

## 2022-02-03 DIAGNOSIS — Z6820 Body mass index (BMI) 20.0-20.9, adult: Secondary | ICD-10-CM | POA: Diagnosis not present

## 2022-02-03 DIAGNOSIS — N179 Acute kidney failure, unspecified: Secondary | ICD-10-CM | POA: Diagnosis not present

## 2022-02-03 DIAGNOSIS — E440 Moderate protein-calorie malnutrition: Secondary | ICD-10-CM | POA: Diagnosis not present

## 2022-02-03 DIAGNOSIS — I7300 Raynaud's syndrome without gangrene: Secondary | ICD-10-CM | POA: Diagnosis not present

## 2022-02-03 DIAGNOSIS — R1312 Dysphagia, oropharyngeal phase: Secondary | ICD-10-CM | POA: Diagnosis not present

## 2022-02-03 DIAGNOSIS — J9601 Acute respiratory failure with hypoxia: Secondary | ICD-10-CM | POA: Diagnosis not present

## 2022-02-03 DIAGNOSIS — Z87891 Personal history of nicotine dependence: Secondary | ICD-10-CM | POA: Diagnosis not present

## 2022-02-03 DIAGNOSIS — R188 Other ascites: Secondary | ICD-10-CM | POA: Diagnosis not present

## 2022-02-03 DIAGNOSIS — D709 Neutropenia, unspecified: Secondary | ICD-10-CM | POA: Diagnosis not present

## 2022-02-03 NOTE — Progress Notes (Deleted)
   Established Patient Office Visit  Subjective   Patient ID: Maria Romero, female    DOB: [birth year]  Age: 71 y.o. MRN: [id number]  No chief complaint on file.   HPI  Maria Romero was recently dx with serous adenocarcinoma right ovary stage IIIc on neoadjuvant chemotherapy requiring recurrent paracentesis, essential hypertension who presented to [facility] ED on 9/6 by discretion of her oncologist for progressive shortness of breath, abdominal distention, diarrhea and generalized weakness.  Patient had recently started on chemotherapy with carboplatinum/Taxol on 8/29 and subsequent 3 days later she began to have persistent diarrhea with decreased oral intake. Chest x-ray on admission shows moderate pleural bilateral pleural effusions.  Underwent IR guided left thoracentesis on 9/14 and 800 mL of fluid removed.  She also went left thoracentesis on 9/15 and 400 mL of fluid removed.  She was eventually weaned to room air.  She also had acute renal failure secondary to dehydration.  Serum creatinine returned to baseline at discharge.  It was recommended that she go to skilled nursing facility to rehab for extensive weakness.  Since discharge she has had 2 paracenteses and has restarted chemotherapy.she tolerated the single agent chemotherapy along with Avastin really well.  She has also had a blood transfusion.  {History (Optional):23778}  ROS    Objective:     There were no vitals taken for this visit. {Vitals History (Optional):23777}  Physical Exam Vitals and nursing note reviewed.  Constitutional:      Appearance: She is well-developed.  HENT:     Head: Normocephalic and atraumatic.  Cardiovascular:     Rate and Rhythm: Normal rate and regular rhythm.     Heart sounds: Normal heart sounds.  Pulmonary:     Effort: Pulmonary effort is normal.     Breath sounds: Normal breath sounds.  Skin:    General: Skin is warm and dry.  Neurological:     Mental Status: She is alert and  oriented to person, place, and time.  Psychiatric:        Behavior: Behavior normal.    No results found for any visits on 02/03/22.  {Labs (Optional):23779}  The 10-year ASCVD risk score (Arnett DK, et al., 2019) is: 13.9%    Assessment & Plan:   Problem List Items Addressed This Visit   None   No follow-ups on file.    Beatrice Lecher, MD

## 2022-02-05 DIAGNOSIS — M810 Age-related osteoporosis without current pathological fracture: Secondary | ICD-10-CM | POA: Diagnosis not present

## 2022-02-05 DIAGNOSIS — I7300 Raynaud's syndrome without gangrene: Secondary | ICD-10-CM | POA: Diagnosis not present

## 2022-02-05 DIAGNOSIS — Z87891 Personal history of nicotine dependence: Secondary | ICD-10-CM | POA: Diagnosis not present

## 2022-02-05 DIAGNOSIS — J9601 Acute respiratory failure with hypoxia: Secondary | ICD-10-CM | POA: Diagnosis not present

## 2022-02-05 DIAGNOSIS — K589 Irritable bowel syndrome without diarrhea: Secondary | ICD-10-CM | POA: Diagnosis not present

## 2022-02-05 DIAGNOSIS — E440 Moderate protein-calorie malnutrition: Secondary | ICD-10-CM | POA: Diagnosis not present

## 2022-02-05 DIAGNOSIS — I10 Essential (primary) hypertension: Secondary | ICD-10-CM | POA: Diagnosis not present

## 2022-02-05 DIAGNOSIS — N179 Acute kidney failure, unspecified: Secondary | ICD-10-CM | POA: Diagnosis not present

## 2022-02-05 DIAGNOSIS — Z9181 History of falling: Secondary | ICD-10-CM | POA: Diagnosis not present

## 2022-02-05 DIAGNOSIS — R188 Other ascites: Secondary | ICD-10-CM | POA: Diagnosis not present

## 2022-02-05 DIAGNOSIS — Z95828 Presence of other vascular implants and grafts: Secondary | ICD-10-CM | POA: Diagnosis not present

## 2022-02-05 DIAGNOSIS — R1312 Dysphagia, oropharyngeal phase: Secondary | ICD-10-CM | POA: Diagnosis not present

## 2022-02-05 DIAGNOSIS — E7800 Pure hypercholesterolemia, unspecified: Secondary | ICD-10-CM | POA: Diagnosis not present

## 2022-02-05 DIAGNOSIS — Z6820 Body mass index (BMI) 20.0-20.9, adult: Secondary | ICD-10-CM | POA: Diagnosis not present

## 2022-02-05 DIAGNOSIS — D709 Neutropenia, unspecified: Secondary | ICD-10-CM | POA: Diagnosis not present

## 2022-02-08 DIAGNOSIS — I7300 Raynaud's syndrome without gangrene: Secondary | ICD-10-CM | POA: Diagnosis not present

## 2022-02-08 DIAGNOSIS — J9601 Acute respiratory failure with hypoxia: Secondary | ICD-10-CM | POA: Diagnosis not present

## 2022-02-08 DIAGNOSIS — N179 Acute kidney failure, unspecified: Secondary | ICD-10-CM | POA: Diagnosis not present

## 2022-02-08 DIAGNOSIS — M810 Age-related osteoporosis without current pathological fracture: Secondary | ICD-10-CM | POA: Diagnosis not present

## 2022-02-08 DIAGNOSIS — E440 Moderate protein-calorie malnutrition: Secondary | ICD-10-CM | POA: Diagnosis not present

## 2022-02-08 DIAGNOSIS — Z9181 History of falling: Secondary | ICD-10-CM | POA: Diagnosis not present

## 2022-02-08 DIAGNOSIS — K589 Irritable bowel syndrome without diarrhea: Secondary | ICD-10-CM | POA: Diagnosis not present

## 2022-02-08 DIAGNOSIS — R188 Other ascites: Secondary | ICD-10-CM | POA: Diagnosis not present

## 2022-02-08 DIAGNOSIS — Z95828 Presence of other vascular implants and grafts: Secondary | ICD-10-CM | POA: Diagnosis not present

## 2022-02-08 DIAGNOSIS — E7800 Pure hypercholesterolemia, unspecified: Secondary | ICD-10-CM | POA: Diagnosis not present

## 2022-02-08 DIAGNOSIS — R1312 Dysphagia, oropharyngeal phase: Secondary | ICD-10-CM | POA: Diagnosis not present

## 2022-02-08 DIAGNOSIS — I10 Essential (primary) hypertension: Secondary | ICD-10-CM | POA: Diagnosis not present

## 2022-02-08 DIAGNOSIS — D709 Neutropenia, unspecified: Secondary | ICD-10-CM | POA: Diagnosis not present

## 2022-02-08 DIAGNOSIS — Z87891 Personal history of nicotine dependence: Secondary | ICD-10-CM | POA: Diagnosis not present

## 2022-02-08 DIAGNOSIS — Z6820 Body mass index (BMI) 20.0-20.9, adult: Secondary | ICD-10-CM | POA: Diagnosis not present

## 2022-02-11 ENCOUNTER — Encounter: Payer: Self-pay | Admitting: *Deleted

## 2022-02-11 DIAGNOSIS — M810 Age-related osteoporosis without current pathological fracture: Secondary | ICD-10-CM | POA: Diagnosis not present

## 2022-02-11 DIAGNOSIS — E7800 Pure hypercholesterolemia, unspecified: Secondary | ICD-10-CM | POA: Diagnosis not present

## 2022-02-11 DIAGNOSIS — I7300 Raynaud's syndrome without gangrene: Secondary | ICD-10-CM | POA: Diagnosis not present

## 2022-02-11 DIAGNOSIS — K589 Irritable bowel syndrome without diarrhea: Secondary | ICD-10-CM | POA: Diagnosis not present

## 2022-02-11 DIAGNOSIS — Z6820 Body mass index (BMI) 20.0-20.9, adult: Secondary | ICD-10-CM | POA: Diagnosis not present

## 2022-02-11 DIAGNOSIS — Z87891 Personal history of nicotine dependence: Secondary | ICD-10-CM | POA: Diagnosis not present

## 2022-02-11 DIAGNOSIS — E440 Moderate protein-calorie malnutrition: Secondary | ICD-10-CM | POA: Diagnosis not present

## 2022-02-11 DIAGNOSIS — I10 Essential (primary) hypertension: Secondary | ICD-10-CM | POA: Diagnosis not present

## 2022-02-11 DIAGNOSIS — J9601 Acute respiratory failure with hypoxia: Secondary | ICD-10-CM | POA: Diagnosis not present

## 2022-02-11 DIAGNOSIS — R1312 Dysphagia, oropharyngeal phase: Secondary | ICD-10-CM | POA: Diagnosis not present

## 2022-02-11 DIAGNOSIS — D709 Neutropenia, unspecified: Secondary | ICD-10-CM | POA: Diagnosis not present

## 2022-02-11 DIAGNOSIS — Z95828 Presence of other vascular implants and grafts: Secondary | ICD-10-CM | POA: Diagnosis not present

## 2022-02-11 DIAGNOSIS — R188 Other ascites: Secondary | ICD-10-CM | POA: Diagnosis not present

## 2022-02-11 DIAGNOSIS — N179 Acute kidney failure, unspecified: Secondary | ICD-10-CM | POA: Diagnosis not present

## 2022-02-11 DIAGNOSIS — Z9181 History of falling: Secondary | ICD-10-CM | POA: Diagnosis not present

## 2022-02-11 NOTE — Progress Notes (Signed)
Patient has been off all her blood pressure medications since her previous hospitalizations. Her PT is concerned because her last few BPs have been elevated - around 140/80. She asks if she should restart her Valsartan '160mg'$  daily.  Reviewed with Dr Marin Olp and he would like patient to restart her valsartan. Patient is aware of Dr Antonieta Pert direction.   Oncology Nurse Navigator Documentation     02/11/2022    3:30 PM  Oncology Nurse Navigator Flowsheets  Navigator Follow Up Date: 02/24/2022  Navigator Follow Up Reason: Follow-up Appointment  Navigator Location [facility]  Navigator Encounter Type Telephone  Telephone Symptom Mgt;Incoming Call  Patient Visit Type MedOnc  Treatment Phase Active Tx  Barriers/Navigation Needs Coordination of Care;Education  Education Pain/ Symptom Management  Interventions Education;Medication Assistance;Psycho-Social Support  Acuity Level 2-Minimal Needs (1-2 Barriers Identified)  Education Method Verbal  Support Groups/Services Friends and Family  Time Spent with Patient 15

## 2022-02-15 DIAGNOSIS — N179 Acute kidney failure, unspecified: Secondary | ICD-10-CM | POA: Diagnosis not present

## 2022-02-15 DIAGNOSIS — I10 Essential (primary) hypertension: Secondary | ICD-10-CM | POA: Diagnosis not present

## 2022-02-15 DIAGNOSIS — Z6820 Body mass index (BMI) 20.0-20.9, adult: Secondary | ICD-10-CM | POA: Diagnosis not present

## 2022-02-15 DIAGNOSIS — Z87891 Personal history of nicotine dependence: Secondary | ICD-10-CM | POA: Diagnosis not present

## 2022-02-15 DIAGNOSIS — I7300 Raynaud's syndrome without gangrene: Secondary | ICD-10-CM | POA: Diagnosis not present

## 2022-02-15 DIAGNOSIS — E7800 Pure hypercholesterolemia, unspecified: Secondary | ICD-10-CM | POA: Diagnosis not present

## 2022-02-15 DIAGNOSIS — D709 Neutropenia, unspecified: Secondary | ICD-10-CM | POA: Diagnosis not present

## 2022-02-15 DIAGNOSIS — R188 Other ascites: Secondary | ICD-10-CM | POA: Diagnosis not present

## 2022-02-15 DIAGNOSIS — M810 Age-related osteoporosis without current pathological fracture: Secondary | ICD-10-CM | POA: Diagnosis not present

## 2022-02-15 DIAGNOSIS — J9601 Acute respiratory failure with hypoxia: Secondary | ICD-10-CM | POA: Diagnosis not present

## 2022-02-15 DIAGNOSIS — E440 Moderate protein-calorie malnutrition: Secondary | ICD-10-CM | POA: Diagnosis not present

## 2022-02-15 DIAGNOSIS — R1312 Dysphagia, oropharyngeal phase: Secondary | ICD-10-CM | POA: Diagnosis not present

## 2022-02-15 DIAGNOSIS — K589 Irritable bowel syndrome without diarrhea: Secondary | ICD-10-CM | POA: Diagnosis not present

## 2022-02-15 DIAGNOSIS — Z9181 History of falling: Secondary | ICD-10-CM | POA: Diagnosis not present

## 2022-02-15 DIAGNOSIS — Z95828 Presence of other vascular implants and grafts: Secondary | ICD-10-CM | POA: Diagnosis not present

## 2022-02-16 ENCOUNTER — Telehealth: Payer: Self-pay

## 2022-02-16 DIAGNOSIS — Z95828 Presence of other vascular implants and grafts: Secondary | ICD-10-CM | POA: Diagnosis not present

## 2022-02-16 DIAGNOSIS — J9601 Acute respiratory failure with hypoxia: Secondary | ICD-10-CM | POA: Diagnosis not present

## 2022-02-16 DIAGNOSIS — I7300 Raynaud's syndrome without gangrene: Secondary | ICD-10-CM | POA: Diagnosis not present

## 2022-02-16 DIAGNOSIS — Z6820 Body mass index (BMI) 20.0-20.9, adult: Secondary | ICD-10-CM | POA: Diagnosis not present

## 2022-02-16 DIAGNOSIS — M810 Age-related osteoporosis without current pathological fracture: Secondary | ICD-10-CM | POA: Diagnosis not present

## 2022-02-16 DIAGNOSIS — Z9181 History of falling: Secondary | ICD-10-CM | POA: Diagnosis not present

## 2022-02-16 DIAGNOSIS — N179 Acute kidney failure, unspecified: Secondary | ICD-10-CM | POA: Diagnosis not present

## 2022-02-16 DIAGNOSIS — R188 Other ascites: Secondary | ICD-10-CM | POA: Diagnosis not present

## 2022-02-16 DIAGNOSIS — E7800 Pure hypercholesterolemia, unspecified: Secondary | ICD-10-CM | POA: Diagnosis not present

## 2022-02-16 DIAGNOSIS — Z87891 Personal history of nicotine dependence: Secondary | ICD-10-CM | POA: Diagnosis not present

## 2022-02-16 DIAGNOSIS — K589 Irritable bowel syndrome without diarrhea: Secondary | ICD-10-CM | POA: Diagnosis not present

## 2022-02-16 DIAGNOSIS — E440 Moderate protein-calorie malnutrition: Secondary | ICD-10-CM | POA: Diagnosis not present

## 2022-02-16 DIAGNOSIS — D709 Neutropenia, unspecified: Secondary | ICD-10-CM | POA: Diagnosis not present

## 2022-02-16 DIAGNOSIS — I10 Essential (primary) hypertension: Secondary | ICD-10-CM | POA: Diagnosis not present

## 2022-02-16 DIAGNOSIS — R1312 Dysphagia, oropharyngeal phase: Secondary | ICD-10-CM | POA: Diagnosis not present

## 2022-02-16 NOTE — Telephone Encounter (Signed)
JoEllen, RN received call from pt yesterday, 11/7. As Dr Marin Olp was out of the office yesterday, message was forwarded to this RN.   Per Maria Romero, pt reports vaginal bleeding since Friday 11/3. Pt states "it's not heavy," but is requiring her to wear a pad. She also describes having "mini clots" occasionally. Wishes for Dr Marin Olp to advise. Pt was notified by Maria Romero that she would hear back from either our office or Gyn/Onc tomorrow upon Dr Antonieta Pert return.   Per Dr Marin Olp, this RN reached out to the office of Dr Berline Lopes, Gyn/Onc MD that has evaluated pt in the past to address this issue via secure chat. dph

## 2022-02-16 NOTE — Telephone Encounter (Signed)
Please tell her to keep an eye on bleeding. If increases, please have her call. Otherwise, we can call her on Friday to check in.

## 2022-02-16 NOTE — Telephone Encounter (Signed)
Pt is aware to keep an eye on bleeding and to call if it increases. Will call on Friday to check in.  She voiced an understanding.

## 2022-02-16 NOTE — Telephone Encounter (Signed)
Our office was notified by [facility] from Dr. Antonieta Pert office, that pt called them with concerns of vaginal spotting.   I reached out to patient this morning. She states she had cramping on Thursday last week and has had sm.amount of bright red spotting since then. She only changes her pad 1 time a day. She notices real tiny clots in the toilet after urinating only. Cramping is not constant. Rates it 2/10 on pain scale. It subsides without any medication. She has not had to use ice packs/heating pad. She was not doing any strenuous activity and nothing has been inserted into the vagina prior to this starting. She is not on any hormones and does not have fever or chills. No other S&S with the spotting.  She has not had recent surgery and states her next chemo treatment is on 11/16.   Pt aware Dr.Tucker is in the OR today but I will still notify her and will call pt back with advise.

## 2022-02-17 DIAGNOSIS — I7300 Raynaud's syndrome without gangrene: Secondary | ICD-10-CM | POA: Diagnosis not present

## 2022-02-17 DIAGNOSIS — E440 Moderate protein-calorie malnutrition: Secondary | ICD-10-CM | POA: Diagnosis not present

## 2022-02-17 DIAGNOSIS — R188 Other ascites: Secondary | ICD-10-CM | POA: Diagnosis not present

## 2022-02-17 DIAGNOSIS — M810 Age-related osteoporosis without current pathological fracture: Secondary | ICD-10-CM | POA: Diagnosis not present

## 2022-02-17 DIAGNOSIS — Z95828 Presence of other vascular implants and grafts: Secondary | ICD-10-CM | POA: Diagnosis not present

## 2022-02-17 DIAGNOSIS — I10 Essential (primary) hypertension: Secondary | ICD-10-CM | POA: Diagnosis not present

## 2022-02-17 DIAGNOSIS — R1312 Dysphagia, oropharyngeal phase: Secondary | ICD-10-CM | POA: Diagnosis not present

## 2022-02-17 DIAGNOSIS — D709 Neutropenia, unspecified: Secondary | ICD-10-CM | POA: Diagnosis not present

## 2022-02-17 DIAGNOSIS — K589 Irritable bowel syndrome without diarrhea: Secondary | ICD-10-CM | POA: Diagnosis not present

## 2022-02-17 DIAGNOSIS — J9601 Acute respiratory failure with hypoxia: Secondary | ICD-10-CM | POA: Diagnosis not present

## 2022-02-17 DIAGNOSIS — Z9181 History of falling: Secondary | ICD-10-CM | POA: Diagnosis not present

## 2022-02-17 DIAGNOSIS — N179 Acute kidney failure, unspecified: Secondary | ICD-10-CM | POA: Diagnosis not present

## 2022-02-17 DIAGNOSIS — Z87891 Personal history of nicotine dependence: Secondary | ICD-10-CM | POA: Diagnosis not present

## 2022-02-17 DIAGNOSIS — E7800 Pure hypercholesterolemia, unspecified: Secondary | ICD-10-CM | POA: Diagnosis not present

## 2022-02-17 DIAGNOSIS — Z6820 Body mass index (BMI) 20.0-20.9, adult: Secondary | ICD-10-CM | POA: Diagnosis not present

## 2022-02-18 ENCOUNTER — Telehealth: Payer: Self-pay

## 2022-02-18 NOTE — Telephone Encounter (Signed)
Pt states she is felling better, having minimal cramping and no blood for 2 days. She states she has chemo next Thursday and thinks maybe this has something to do with it. She will call back (after hours triage number given) if anything changes.

## 2022-02-21 ENCOUNTER — Encounter: Payer: Self-pay | Admitting: Hematology & Oncology

## 2022-02-22 DIAGNOSIS — R1312 Dysphagia, oropharyngeal phase: Secondary | ICD-10-CM | POA: Diagnosis not present

## 2022-02-22 DIAGNOSIS — R188 Other ascites: Secondary | ICD-10-CM | POA: Diagnosis not present

## 2022-02-22 DIAGNOSIS — I7300 Raynaud's syndrome without gangrene: Secondary | ICD-10-CM | POA: Diagnosis not present

## 2022-02-22 DIAGNOSIS — K589 Irritable bowel syndrome without diarrhea: Secondary | ICD-10-CM | POA: Diagnosis not present

## 2022-02-22 DIAGNOSIS — M810 Age-related osteoporosis without current pathological fracture: Secondary | ICD-10-CM | POA: Diagnosis not present

## 2022-02-22 DIAGNOSIS — Z6820 Body mass index (BMI) 20.0-20.9, adult: Secondary | ICD-10-CM | POA: Diagnosis not present

## 2022-02-22 DIAGNOSIS — Z9181 History of falling: Secondary | ICD-10-CM | POA: Diagnosis not present

## 2022-02-22 DIAGNOSIS — Z95828 Presence of other vascular implants and grafts: Secondary | ICD-10-CM | POA: Diagnosis not present

## 2022-02-22 DIAGNOSIS — N179 Acute kidney failure, unspecified: Secondary | ICD-10-CM | POA: Diagnosis not present

## 2022-02-22 DIAGNOSIS — J9601 Acute respiratory failure with hypoxia: Secondary | ICD-10-CM | POA: Diagnosis not present

## 2022-02-22 DIAGNOSIS — E7800 Pure hypercholesterolemia, unspecified: Secondary | ICD-10-CM | POA: Diagnosis not present

## 2022-02-22 DIAGNOSIS — E440 Moderate protein-calorie malnutrition: Secondary | ICD-10-CM | POA: Diagnosis not present

## 2022-02-22 DIAGNOSIS — D709 Neutropenia, unspecified: Secondary | ICD-10-CM | POA: Diagnosis not present

## 2022-02-22 DIAGNOSIS — I10 Essential (primary) hypertension: Secondary | ICD-10-CM | POA: Diagnosis not present

## 2022-02-22 DIAGNOSIS — Z87891 Personal history of nicotine dependence: Secondary | ICD-10-CM | POA: Diagnosis not present

## 2022-02-23 NOTE — Addendum Note (Signed)
Encounter addended by: Janine Limbo, RT on: 02/23/2022 4:40 PM  Actions taken: Imaging Exam ended

## 2022-02-24 ENCOUNTER — Inpatient Hospital Stay: Payer: Medicare Other | Attending: Hematology & Oncology

## 2022-02-24 ENCOUNTER — Inpatient Hospital Stay: Payer: Medicare Other

## 2022-02-24 ENCOUNTER — Inpatient Hospital Stay: Payer: Medicare Other | Admitting: Medical Oncology

## 2022-02-24 ENCOUNTER — Encounter: Payer: Self-pay | Admitting: Medical Oncology

## 2022-02-24 ENCOUNTER — Encounter: Payer: Self-pay | Admitting: *Deleted

## 2022-02-24 VITALS — BP 148/82 | HR 74 | Temp 97.6°F | Resp 17 | Wt 126.0 lb

## 2022-02-24 DIAGNOSIS — C561 Malignant neoplasm of right ovary: Secondary | ICD-10-CM | POA: Insufficient documentation

## 2022-02-24 DIAGNOSIS — D6481 Anemia due to antineoplastic chemotherapy: Secondary | ICD-10-CM

## 2022-02-24 DIAGNOSIS — N6311 Unspecified lump in the right breast, upper outer quadrant: Secondary | ICD-10-CM | POA: Diagnosis not present

## 2022-02-24 DIAGNOSIS — D702 Other drug-induced agranulocytosis: Secondary | ICD-10-CM | POA: Insufficient documentation

## 2022-02-24 DIAGNOSIS — T451X5A Adverse effect of antineoplastic and immunosuppressive drugs, initial encounter: Secondary | ICD-10-CM | POA: Diagnosis not present

## 2022-02-24 DIAGNOSIS — Z95828 Presence of other vascular implants and grafts: Secondary | ICD-10-CM

## 2022-02-24 DIAGNOSIS — Z5111 Encounter for antineoplastic chemotherapy: Secondary | ICD-10-CM | POA: Insufficient documentation

## 2022-02-24 LAB — CMP (CANCER CENTER ONLY)
ALT: 11 U/L (ref 0–44)
AST: 15 U/L (ref 15–41)
Albumin: 3.8 g/dL (ref 3.5–5.0)
Alkaline Phosphatase: 85 U/L (ref 38–126)
Anion gap: 9 (ref 5–15)
BUN: 17 mg/dL (ref 8–23)
CO2: 25 mmol/L (ref 22–32)
Calcium: 9.4 mg/dL (ref 8.9–10.3)
Chloride: 106 mmol/L (ref 98–111)
Creatinine: 0.82 mg/dL (ref 0.44–1.00)
GFR, Estimated: >60 mL/min (ref >60)
Glucose, Bld: 97 mg/dL (ref 70–99)
Potassium: 3.9 mmol/L (ref 3.5–5.1)
Sodium: 140 mmol/L (ref 135–145)
Total Bilirubin: 0.4 mg/dL (ref 0.3–1.2)
Total Protein: 6.7 g/dL (ref 6.5–8.1)

## 2022-02-24 LAB — RETICULOCYTES
Immature Retic Fract: 16.5 % — ABNORMAL HIGH (ref 2.3–15.9)
RBC.: 3.27 MIL/uL — ABNORMAL LOW (ref 3.87–5.11)
Retic Count, Absolute: 73.6 10*3/uL (ref 19.0–186.0)
Retic Ct Pct: 2.3 % (ref 0.4–3.1)

## 2022-02-24 LAB — CBC WITH DIFFERENTIAL/PLATELET
Abs Immature Granulocytes: 0.07 10*3/uL (ref 0.00–0.07)
Basophils Absolute: 0 10*3/uL (ref 0.0–0.1)
Basophils Relative: 0 %
Eosinophils Absolute: 0.1 10*3/uL (ref 0.0–0.5)
Eosinophils Relative: 2 %
HCT: 30.8 % — ABNORMAL LOW (ref 36.0–46.0)
Hemoglobin: 9.6 g/dL — ABNORMAL LOW (ref 12.0–15.0)
Immature Granulocytes: 1 %
Lymphocytes Relative: 25 %
Lymphs Abs: 1.6 10*3/uL (ref 0.7–4.0)
MCH: 29.3 pg (ref 26.0–34.0)
MCHC: 31.2 g/dL (ref 30.0–36.0)
MCV: 93.9 fL (ref 80.0–100.0)
Monocytes Absolute: 0.5 10*3/uL (ref 0.1–1.0)
Monocytes Relative: 8 %
Neutro Abs: 4 10*3/uL (ref 1.7–7.7)
Neutrophils Relative %: 64 %
Platelets: 374 10*3/uL (ref 150–400)
RBC: 3.28 MIL/uL — ABNORMAL LOW (ref 3.87–5.11)
RDW: 21.8 % — ABNORMAL HIGH (ref 11.5–15.5)
WBC: 6.3 10*3/uL (ref 4.0–10.5)
nRBC: 0 % (ref 0.0–0.2)

## 2022-02-24 LAB — IRON AND IRON BINDING CAPACITY (CC-WL,HP ONLY)
Iron: 68 ug/dL (ref 28–170)
Saturation Ratios: 29 % (ref 10.4–31.8)
TIBC: 234 ug/dL — ABNORMAL LOW (ref 250–450)
UIBC: 166 ug/dL (ref 148–442)

## 2022-02-24 LAB — SAMPLE TO BLOOD BANK

## 2022-02-24 LAB — PREALBUMIN: Prealbumin: 21 mg/dL (ref 18–38)

## 2022-02-24 LAB — FERRITIN: Ferritin: 782 ng/mL — ABNORMAL HIGH (ref 11–307)

## 2022-02-24 LAB — LACTATE DEHYDROGENASE: LDH: 202 U/L — ABNORMAL HIGH (ref 98–192)

## 2022-02-24 MED ORDER — SODIUM CHLORIDE 0.9 % IV SOLN
150.0000 mg | Freq: Once | INTRAVENOUS | Status: AC
  Administered 2022-02-24: 150 mg via INTRAVENOUS
  Filled 2022-02-24: qty 150

## 2022-02-24 MED ORDER — SODIUM CHLORIDE 0.9 % IV SOLN
15.0000 mg/kg | Freq: Once | INTRAVENOUS | Status: AC
  Administered 2022-02-24: 900 mg via INTRAVENOUS
  Filled 2022-02-24: qty 32

## 2022-02-24 MED ORDER — SODIUM CHLORIDE 0.9 % IV SOLN
385.0000 mg | Freq: Once | INTRAVENOUS | Status: AC
  Administered 2022-02-24: 390 mg via INTRAVENOUS
  Filled 2022-02-24: qty 39

## 2022-02-24 MED ORDER — PALONOSETRON HCL INJECTION 0.25 MG/5ML
0.2500 mg | Freq: Once | INTRAVENOUS | Status: AC
  Administered 2022-02-24: 0.25 mg via INTRAVENOUS
  Filled 2022-02-24: qty 5

## 2022-02-24 MED ORDER — SODIUM CHLORIDE 0.9 % IV SOLN
10.0000 mg | Freq: Once | INTRAVENOUS | Status: AC
  Administered 2022-02-24: 10 mg via INTRAVENOUS
  Filled 2022-02-24: qty 10

## 2022-02-24 MED ORDER — SODIUM CHLORIDE 0.9 % IV SOLN: Freq: Once | INTRAVENOUS | Status: AC

## 2022-02-24 MED ORDER — SODIUM CHLORIDE 0.9% FLUSH
10.0000 mL | INTRAVENOUS | Status: DC | PRN
  Administered 2022-02-24: 10 mL

## 2022-02-24 MED ORDER — HEPARIN SOD (PORK) LOCK FLUSH 100 UNIT/ML IV SOLN
500.0000 [IU] | Freq: Once | INTRAVENOUS | Status: AC | PRN
  Administered 2022-02-24: 500 [IU]

## 2022-02-24 NOTE — Addendum Note (Signed)
Addended by: Nelwyn Salisbury on: 02/24/2022 01:19 PM   Modules accepted: Orders

## 2022-02-24 NOTE — Progress Notes (Signed)
Hematology and Oncology Follow Up Visit  Maria Romero [id number] 1950/06/28 71 y.o. 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 Unfortunately, the insurance company would not approve the olaparib immuno she has a tumor that is HRD (+).  Since her last visit, she noticed right breast pain and two lumps last night. The lumps are tender in nature. They feel the same today as they did last night. No nipple discharge. No cold symptoms, No fevers. She has not tried anything for them. Has a history of fibrocystic breasts. Last mammogram was in Jan 2023 BI-RADS Category 1. In addition she had a 7 day history of vaginal spotting. Her OB-GYN is aware. They are hoping for her to be able to undergo a hysterectomy and removal of the ovarian mass following her neoadjuvant chemotherapy.   Continues to tolerate single agent Carboplatinum along with Avastin really well.  Weight is stable. Eating and drinking well.   Overall, I would have to say that her performance status is probably ECOG 2. Wt Readings from Last 3 Encounters:  02/24/22 126 lb (57.2 kg)  02/01/22 126 lb 0.6 oz (57.2 kg)  01/21/22 118 lb 0.3 oz (53.5 kg)    Medications:  Current Outpatient Medications:    atorvastatin (LIPITOR) 40 MG tablet, TAKE ONE TABLET BY MOUTH EVERY NIGHT AT BEDTIME, Disp: 90 tablet, Rfl: 3   Calcium-Vitamin D-Vitamin K (VIACTIV PO), Take by mouth See admin instructions. Chew 1 square by mouth once a day- for calcium, Disp: , Rfl:    cholecalciferol (VITAMIN D3) 25 MCG (1000 UNIT) tablet, Take 1,000 Units by mouth daily., Disp: , Rfl:     dexamethasone (DECADRON) 4 MG tablet, Take 2 tablets by mouth starting the day after chemotherapy for 3 days. Take with food., Disp: 30 tablet, Rfl: 1   magic mouthwash (nystatin, lidocaine, diphenhydrAMINE, alum & mag hydroxide) suspension, Swish and spit 5 mLs 4 (four) times daily as needed for mouth pain., Disp: 240 mL, Rfl: 1   midodrine (PROAMATINE) 10 MG tablet, Take 1 tablet (10 mg total) by mouth 3 (three) times daily with meals., Disp: 90 tablet, Rfl: 1   omeprazole (PRILOSEC) 20 MG capsule, Take 1 capsule (20 mg total) by mouth daily., Disp: 90 capsule, Rfl: 2   ondansetron (ZOFRAN) 8 MG tablet, Take 1 tablet (8 mg total) by mouth every 8 (eight) hours as needed for nausea or vomiting. Start on the third day after chemotherapy., Disp: 30 tablet, Rfl: 1   PRILOSEC OTC 20 MG tablet, Take 20 mg by mouth daily before breakfast. (Patient not taking: Reported on 01/21/2022), Disp: , Rfl:    prochlorperazine (COMPAZINE) 10 MG tablet, Take 1 tablet (10 mg total) by mouth every 6 (six) hours as needed for nausea or vomiting., Disp: 30 tablet, Rfl: 1 No current facility-administered medications for this visit.  Facility-Administered Medications Ordered in Other Visits:    CARBOplatin (PARAPLATIN) 390 mg in sodium chloride 0.9 % 100 mL chemo infusion, 390 mg, Intravenous, Once, Ennever, Rudell Cobb, MD, Last Rate: 278 mL/hr at 02/24/22 1311, 390 mg at 02/24/22 1311   heparin lock flush 100 unit/mL, 500 Units, Intracatheter,  Once PRN, Ennever, Rudell Cobb, MD   sodium chloride flush (NS) 0.9 % injection 10 mL, 10 mL, Intracatheter, PRN, Marin Olp, Rudell Cobb, MD  Allergies:  Allergies  Allergen Reactions   Fluvastatin Sodium Other (See Comments)    Myalgias    Past Medical History, Surgical history, Social history, and Family History were reviewed and updated.  Review of Systems: Review of Systems  Constitutional: Negative.   HENT:  Negative.    Eyes: Negative.   Respiratory: Negative.     Cardiovascular: Negative.   Gastrointestinal:  Negative for abdominal pain.  Genitourinary:  Negative for pelvic pain.   Musculoskeletal: Negative.   Skin: Negative.   Neurological: Negative.   Hematological: Negative.   Psychiatric/Behavioral: Negative.      Physical Exam:  weight is 126 lb (57.2 kg). Her oral temperature is 97.6 F (36.4 C). Her blood pressure is 148/82 (abnormal) and her pulse is 74. Her respiration is 17 and oxygen saturation is 100%.   Wt Readings from Last 3 Encounters:  02/24/22 126 lb (57.2 kg)  02/01/22 126 lb 0.6 oz (57.2 kg)  01/21/22 118 lb 0.3 oz (53.5 kg)    Physical Exam Vitals reviewed.  HENT:     Head: Normocephalic and atraumatic.  Eyes:     Pupils: Pupils are equal, round, and reactive to light.  Cardiovascular:     Rate and Rhythm: Normal rate and regular rhythm.     Heart sounds: Normal heart sounds.  Pulmonary:     Effort: Pulmonary effort is normal.     Breath sounds: Normal breath sounds.  Chest:    Abdominal:     General: Bowel sounds are normal.     Palpations: Abdomen is soft.     Comments: Abdominal exam is scantly distended  Musculoskeletal:        General: No tenderness or deformity. Normal range of motion.     Cervical back: Normal range of motion.  Lymphadenopathy:     Cervical: No cervical adenopathy.  Skin:    General: Skin is warm and dry.     Findings: No erythema or rash.  Neurological:     Mental Status: She is alert and oriented to person, place, and time.  Psychiatric:        Behavior: Behavior normal.        Thought Content: Thought content normal.        Judgment: Judgment normal.      Lab Results  Component Value Date   WBC 6.3 02/24/2022   HGB 9.6 (L) 02/24/2022   HCT 30.8 (L) 02/24/2022   MCV 93.9 02/24/2022   PLT 374 02/24/2022     Chemistry      Component Value Date/Time   NA 140 02/24/2022 1010   K 3.9 02/24/2022 1010   CL 106 02/24/2022 1010   CO2 25 02/24/2022 1010   BUN 17  02/24/2022 1010   CREATININE 0.82 02/24/2022 1010   CREATININE 0.93 11/03/2021 0000      Component Value Date/Time   CALCIUM 9.4 02/24/2022 1010   ALKPHOS 85 02/24/2022 1010   AST 15 02/24/2022 1010   ALT 11 02/24/2022 1010   BILITOT 0.4 02/24/2022 1010     Encounter Diagnoses  Name Primary?   Mass of upper outer quadrant of right breast Yes   [name], right (HCC)    Neutropenia, drug-induced (HCC)    Anemia due to antineoplastic chemotherapy    Port-A-Cath in place     Impression and Plan:  Ms. Torrance is a very charming 71 year old white female.  Looks like she has stage IIIC ovarian cancer.  She has a large right ovarian mass.  Tolerating her single agent Carboplatin and avastin well.  Her CBC today shows a hemoglobin of 9.6 which is down from 11.4 at last visit but improved from 8.0 on 01/21/2022. May be due to her recent spotting which has resolved. CMP looks normal. Proceed forward with Cycle 4 today of her Carboplatin/Avastin. Placing orders for a diagnostic mammogram and ultrasound to evaluate further. Additionally she is due for repeat imaging after this cycle. I will place these orders and ideally she will have these completed before her next MD visit. APP to see in 1 week to ensure CBC is improved.   Disposition:  Cycle 4 Day 1 carboplatinum/Avastin today Orders placed for Diagnostic mammogram/US to be completed ASAP. CT Chest/ABD/Pelvis w/ ordered to be completed in roughly 2 weeks.  RTC 1 week APP, labs 9 (CBC) RTC 3 weeks MD, labs, Cycle 4 Day 1 carboplatinum/Avastin    Hughie Closs, PA-C 11/16/20231:14 PM

## 2022-02-24 NOTE — Patient Instructions (Signed)

## 2022-02-24 NOTE — Progress Notes (Signed)
Patient will need to have scans scheduled prior to her next treatment. Will await authorization.  She also has three new lumps in her breast that will need mammogram and possible [place]. Orders placed.   Oncology Nurse Navigator Documentation     02/24/2022   10:30 AM  Oncology Nurse Navigator Flowsheets  Navigator Follow Up Date: 02/25/2022  Navigator Follow Up Reason: Appointment Review  Navigator Location [facility]  Navigator Encounter Type Treatment;Appt/Treatment Plan Review  Patient Visit Type MedOnc  Treatment Phase Active Tx  Barriers/Navigation Needs Coordination of Care;Education  Interventions Psycho-Social Support  Acuity Level 2-Minimal Needs (1-2 Barriers Identified)  Support Groups/Services Friends and Family  Time Spent with Patient 15

## 2022-02-25 ENCOUNTER — Encounter: Payer: Self-pay | Admitting: *Deleted

## 2022-02-25 ENCOUNTER — Other Ambulatory Visit: Payer: Self-pay | Admitting: Medical Oncology

## 2022-02-25 DIAGNOSIS — I10 Essential (primary) hypertension: Secondary | ICD-10-CM | POA: Diagnosis not present

## 2022-02-25 DIAGNOSIS — Z95828 Presence of other vascular implants and grafts: Secondary | ICD-10-CM | POA: Diagnosis not present

## 2022-02-25 DIAGNOSIS — I7300 Raynaud's syndrome without gangrene: Secondary | ICD-10-CM | POA: Diagnosis not present

## 2022-02-25 DIAGNOSIS — J9601 Acute respiratory failure with hypoxia: Secondary | ICD-10-CM | POA: Diagnosis not present

## 2022-02-25 DIAGNOSIS — E7800 Pure hypercholesterolemia, unspecified: Secondary | ICD-10-CM | POA: Diagnosis not present

## 2022-02-25 DIAGNOSIS — R188 Other ascites: Secondary | ICD-10-CM | POA: Diagnosis not present

## 2022-02-25 DIAGNOSIS — N6311 Unspecified lump in the right breast, upper outer quadrant: Secondary | ICD-10-CM

## 2022-02-25 DIAGNOSIS — R1312 Dysphagia, oropharyngeal phase: Secondary | ICD-10-CM | POA: Diagnosis not present

## 2022-02-25 DIAGNOSIS — K589 Irritable bowel syndrome without diarrhea: Secondary | ICD-10-CM | POA: Diagnosis not present

## 2022-02-25 DIAGNOSIS — Z6820 Body mass index (BMI) 20.0-20.9, adult: Secondary | ICD-10-CM | POA: Diagnosis not present

## 2022-02-25 DIAGNOSIS — N179 Acute kidney failure, unspecified: Secondary | ICD-10-CM | POA: Diagnosis not present

## 2022-02-25 DIAGNOSIS — Z87891 Personal history of nicotine dependence: Secondary | ICD-10-CM | POA: Diagnosis not present

## 2022-02-25 DIAGNOSIS — Z9181 History of falling: Secondary | ICD-10-CM | POA: Diagnosis not present

## 2022-02-25 DIAGNOSIS — E440 Moderate protein-calorie malnutrition: Secondary | ICD-10-CM | POA: Diagnosis not present

## 2022-02-25 DIAGNOSIS — M810 Age-related osteoporosis without current pathological fracture: Secondary | ICD-10-CM | POA: Diagnosis not present

## 2022-02-25 DIAGNOSIS — D709 Neutropenia, unspecified: Secondary | ICD-10-CM | POA: Diagnosis not present

## 2022-02-25 LAB — CA 125: Cancer Antigen (CA) 125: 369 U/mL — ABNORMAL HIGH (ref 0.0–38.1)

## 2022-02-25 NOTE — Progress Notes (Signed)
Patient needs a mammogram/US. She requested that this be completed at one of the medcenters. Called [facility] and [facility] and neither location completes diagnostic mammograms. She will need to be seen at [facility] Called patient and spoke with her, explaining the need to change the location. She understood.   Message sent to [facility] to try to expedite scheduling. CT is still pending authorization. Will get scheduled once authorized.   Oncology Nurse Navigator Documentation     02/25/2022    1:15 PM  Oncology Nurse Navigator Flowsheets  Navigator Follow Up Date: 02/28/2022  Navigator Follow Up Reason: Appointment Review  Navigator Location [facility]  Navigator Encounter Type Appt/Treatment Plan Review;Telephone  Telephone Appt Confirmation/Clarification;Education;Outgoing Call  Patient Visit Type MedOnc  Treatment Phase Active Tx  Barriers/Navigation Needs Coordination of Care;Education  Education Other  Interventions Coordination of Care;Education;Psycho-Social Support  Acuity Level 2-Minimal Needs (1-2 Barriers Identified)  Coordination of Care Radiology  Education Method Verbal  Support Groups/Services Friends and Family  Time Spent with Patient 71

## 2022-02-28 ENCOUNTER — Other Ambulatory Visit: Payer: Self-pay | Admitting: Medical Oncology

## 2022-02-28 DIAGNOSIS — N6311 Unspecified lump in the right breast, upper outer quadrant: Secondary | ICD-10-CM

## 2022-03-01 ENCOUNTER — Encounter: Payer: Self-pay | Admitting: *Deleted

## 2022-03-01 DIAGNOSIS — Z95828 Presence of other vascular implants and grafts: Secondary | ICD-10-CM | POA: Diagnosis not present

## 2022-03-01 DIAGNOSIS — Z87891 Personal history of nicotine dependence: Secondary | ICD-10-CM | POA: Diagnosis not present

## 2022-03-01 DIAGNOSIS — I10 Essential (primary) hypertension: Secondary | ICD-10-CM | POA: Diagnosis not present

## 2022-03-01 DIAGNOSIS — K589 Irritable bowel syndrome without diarrhea: Secondary | ICD-10-CM | POA: Diagnosis not present

## 2022-03-01 DIAGNOSIS — J9601 Acute respiratory failure with hypoxia: Secondary | ICD-10-CM | POA: Diagnosis not present

## 2022-03-01 DIAGNOSIS — Z6820 Body mass index (BMI) 20.0-20.9, adult: Secondary | ICD-10-CM | POA: Diagnosis not present

## 2022-03-01 DIAGNOSIS — D709 Neutropenia, unspecified: Secondary | ICD-10-CM | POA: Diagnosis not present

## 2022-03-01 DIAGNOSIS — Z9181 History of falling: Secondary | ICD-10-CM | POA: Diagnosis not present

## 2022-03-01 DIAGNOSIS — M810 Age-related osteoporosis without current pathological fracture: Secondary | ICD-10-CM | POA: Diagnosis not present

## 2022-03-01 DIAGNOSIS — R1312 Dysphagia, oropharyngeal phase: Secondary | ICD-10-CM | POA: Diagnosis not present

## 2022-03-01 DIAGNOSIS — E7800 Pure hypercholesterolemia, unspecified: Secondary | ICD-10-CM | POA: Diagnosis not present

## 2022-03-01 DIAGNOSIS — I7300 Raynaud's syndrome without gangrene: Secondary | ICD-10-CM | POA: Diagnosis not present

## 2022-03-01 DIAGNOSIS — E440 Moderate protein-calorie malnutrition: Secondary | ICD-10-CM | POA: Diagnosis not present

## 2022-03-01 DIAGNOSIS — R188 Other ascites: Secondary | ICD-10-CM | POA: Diagnosis not present

## 2022-03-01 DIAGNOSIS — N179 Acute kidney failure, unspecified: Secondary | ICD-10-CM | POA: Diagnosis not present

## 2022-03-01 NOTE — Progress Notes (Signed)
Patient is scheduled for her diagnostic mm and [place] tomorrow. She is also scheduled for her CT scan. Appointment made for port access for her CT.   Oncology Nurse Navigator Documentation     03/01/2022    2:30 PM  Oncology Nurse Navigator Flowsheets  Navigator Follow Up Date: 03/02/2022  Navigator Follow Up Reason: Scan Review  Navigator Location [facility]  Navigator Encounter Type Appt/Treatment Plan Review  Patient Visit Type MedOnc  Treatment Phase Active Tx  Barriers/Navigation Needs Coordination of Care;Education  Interventions Coordination of Care  Acuity Level 2-Minimal Needs (1-2 Barriers Identified)  Coordination of Care Radiology  Support Groups/Services Friends and Family  Time Spent with Patient 30

## 2022-03-02 ENCOUNTER — Ambulatory Visit
Admission: RE | Admit: 2022-03-02 | Discharge: 2022-03-02 | Disposition: A | Discharge status: Home / Self Care | Payer: Medicare Other | Source: Ambulatory Visit | Attending: Medical Oncology | Admitting: Medical Oncology

## 2022-03-02 ENCOUNTER — Encounter: Payer: Self-pay | Admitting: *Deleted

## 2022-03-02 DIAGNOSIS — N6311 Unspecified lump in the right breast, upper outer quadrant: Secondary | ICD-10-CM

## 2022-03-02 NOTE — Progress Notes (Signed)
Mammo and [place] rescheduled to this afternoon.   Oncology Nurse Navigator Documentation     03/02/2022   10:00 AM  Oncology Nurse Navigator Flowsheets  Navigator Follow Up Date: 03/08/2022  Navigator Follow Up Reason: Scan Review  Navigator Location [facility]  Navigator Encounter Type Scan Review  Patient Visit Type MedOnc  Treatment Phase Active Tx  Barriers/Navigation Needs Coordination of Care;Education  Interventions None Required  Acuity Level 2-Minimal Needs (1-2 Barriers Identified)  Support Groups/Services Friends and Family  Time Spent with Patient 15

## 2022-03-03 ENCOUNTER — Other Ambulatory Visit: Payer: Self-pay

## 2022-03-08 ENCOUNTER — Inpatient Hospital Stay: Payer: Medicare Other

## 2022-03-08 ENCOUNTER — Encounter: Payer: Self-pay | Admitting: *Deleted

## 2022-03-08 ENCOUNTER — Ambulatory Visit (HOSPITAL_BASED_OUTPATIENT_CLINIC_OR_DEPARTMENT_OTHER)
Admission: RE | Admit: 2022-03-08 | Discharge: 2022-03-08 | Disposition: A | Discharge status: Home / Self Care | Payer: Medicare Other | Source: Ambulatory Visit | Attending: Medical Oncology | Admitting: Medical Oncology

## 2022-03-08 VITALS — BP 142/76 | HR 92 | Temp 98.0°F

## 2022-03-08 DIAGNOSIS — J9601 Acute respiratory failure with hypoxia: Secondary | ICD-10-CM | POA: Diagnosis not present

## 2022-03-08 DIAGNOSIS — R188 Other ascites: Secondary | ICD-10-CM | POA: Diagnosis not present

## 2022-03-08 DIAGNOSIS — M47816 Spondylosis without myelopathy or radiculopathy, lumbar region: Secondary | ICD-10-CM | POA: Insufficient documentation

## 2022-03-08 DIAGNOSIS — Z9181 History of falling: Secondary | ICD-10-CM | POA: Diagnosis not present

## 2022-03-08 DIAGNOSIS — Z87891 Personal history of nicotine dependence: Secondary | ICD-10-CM | POA: Diagnosis not present

## 2022-03-08 DIAGNOSIS — C561 Malignant neoplasm of right ovary: Secondary | ICD-10-CM

## 2022-03-08 DIAGNOSIS — M4316 Spondylolisthesis, lumbar region: Secondary | ICD-10-CM | POA: Insufficient documentation

## 2022-03-08 DIAGNOSIS — Z8543 Personal history of malignant neoplasm of ovary: Secondary | ICD-10-CM | POA: Insufficient documentation

## 2022-03-08 DIAGNOSIS — N179 Acute kidney failure, unspecified: Secondary | ICD-10-CM | POA: Diagnosis not present

## 2022-03-08 DIAGNOSIS — E7800 Pure hypercholesterolemia, unspecified: Secondary | ICD-10-CM | POA: Diagnosis not present

## 2022-03-08 DIAGNOSIS — N6311 Unspecified lump in the right breast, upper outer quadrant: Secondary | ICD-10-CM | POA: Insufficient documentation

## 2022-03-08 DIAGNOSIS — I7300 Raynaud's syndrome without gangrene: Secondary | ICD-10-CM | POA: Diagnosis not present

## 2022-03-08 DIAGNOSIS — I10 Essential (primary) hypertension: Secondary | ICD-10-CM | POA: Diagnosis not present

## 2022-03-08 DIAGNOSIS — I2510 Atherosclerotic heart disease of native coronary artery without angina pectoris: Secondary | ICD-10-CM | POA: Diagnosis not present

## 2022-03-08 DIAGNOSIS — M47817 Spondylosis without myelopathy or radiculopathy, lumbosacral region: Secondary | ICD-10-CM | POA: Insufficient documentation

## 2022-03-08 DIAGNOSIS — E440 Moderate protein-calorie malnutrition: Secondary | ICD-10-CM | POA: Diagnosis not present

## 2022-03-08 DIAGNOSIS — D709 Neutropenia, unspecified: Secondary | ICD-10-CM | POA: Diagnosis not present

## 2022-03-08 DIAGNOSIS — R1312 Dysphagia, oropharyngeal phase: Secondary | ICD-10-CM | POA: Diagnosis not present

## 2022-03-08 DIAGNOSIS — I700 Atherosclerosis of aorta: Secondary | ICD-10-CM | POA: Diagnosis not present

## 2022-03-08 DIAGNOSIS — Z6820 Body mass index (BMI) 20.0-20.9, adult: Secondary | ICD-10-CM | POA: Diagnosis not present

## 2022-03-08 DIAGNOSIS — Z95828 Presence of other vascular implants and grafts: Secondary | ICD-10-CM | POA: Diagnosis not present

## 2022-03-08 DIAGNOSIS — M810 Age-related osteoporosis without current pathological fracture: Secondary | ICD-10-CM | POA: Diagnosis not present

## 2022-03-08 DIAGNOSIS — J90 Pleural effusion, not elsewhere classified: Secondary | ICD-10-CM | POA: Insufficient documentation

## 2022-03-08 DIAGNOSIS — K589 Irritable bowel syndrome without diarrhea: Secondary | ICD-10-CM | POA: Diagnosis not present

## 2022-03-08 MED ORDER — HEPARIN SOD (PORK) LOCK FLUSH 100 UNIT/ML IV SOLN
500.0000 [IU] | Freq: Once | INTRAVENOUS | Status: DC
  Administered 2022-03-08: 500 [IU] via INTRAVENOUS

## 2022-03-08 MED ORDER — IOHEXOL 300 MG/ML  SOLN
100.0000 mL | Freq: Once | INTRAMUSCULAR | Status: AC | PRN
  Administered 2022-03-08: 100 mL via INTRAVENOUS

## 2022-03-08 MED ORDER — SODIUM CHLORIDE 0.9% FLUSH
10.0000 mL | INTRAVENOUS | Status: DC | PRN
  Administered 2022-03-08: 10 mL via INTRAVENOUS

## 2022-03-08 NOTE — Patient Instructions (Signed)

## 2022-03-08 NOTE — Progress Notes (Signed)
Reviewed mammogram and [place] which were negative for any malignant findings. She has her CT later today. Will follow for those results.   Oncology Nurse Navigator Documentation     03/08/2022   10:15 AM  Oncology Nurse Navigator Flowsheets  Navigator Follow Up Date: 03/09/2022  Navigator Follow Up Reason: Scan Review  Navigator Location [facility]  Navigator Encounter Type Scan Review  Patient Visit Type MedOnc  Treatment Phase Active Tx  Barriers/Navigation Needs Coordination of Care;Education  Interventions None Required  Acuity Level 2-Minimal Needs (1-2 Barriers Identified)  Support Groups/Services Friends and Family  Time Spent with Patient 15

## 2022-03-10 ENCOUNTER — Other Ambulatory Visit: Payer: Self-pay

## 2022-03-10 ENCOUNTER — Encounter: Payer: Self-pay | Admitting: *Deleted

## 2022-03-10 DIAGNOSIS — N179 Acute kidney failure, unspecified: Secondary | ICD-10-CM | POA: Diagnosis not present

## 2022-03-10 DIAGNOSIS — M810 Age-related osteoporosis without current pathological fracture: Secondary | ICD-10-CM | POA: Diagnosis not present

## 2022-03-10 DIAGNOSIS — R188 Other ascites: Secondary | ICD-10-CM | POA: Diagnosis not present

## 2022-03-10 DIAGNOSIS — Z95828 Presence of other vascular implants and grafts: Secondary | ICD-10-CM | POA: Diagnosis not present

## 2022-03-10 DIAGNOSIS — E440 Moderate protein-calorie malnutrition: Secondary | ICD-10-CM | POA: Diagnosis not present

## 2022-03-10 DIAGNOSIS — I10 Essential (primary) hypertension: Secondary | ICD-10-CM | POA: Diagnosis not present

## 2022-03-10 DIAGNOSIS — I7300 Raynaud's syndrome without gangrene: Secondary | ICD-10-CM | POA: Diagnosis not present

## 2022-03-10 DIAGNOSIS — Z6820 Body mass index (BMI) 20.0-20.9, adult: Secondary | ICD-10-CM | POA: Diagnosis not present

## 2022-03-10 DIAGNOSIS — E7800 Pure hypercholesterolemia, unspecified: Secondary | ICD-10-CM | POA: Diagnosis not present

## 2022-03-10 DIAGNOSIS — R1312 Dysphagia, oropharyngeal phase: Secondary | ICD-10-CM | POA: Diagnosis not present

## 2022-03-10 DIAGNOSIS — K589 Irritable bowel syndrome without diarrhea: Secondary | ICD-10-CM | POA: Diagnosis not present

## 2022-03-10 DIAGNOSIS — D709 Neutropenia, unspecified: Secondary | ICD-10-CM | POA: Diagnosis not present

## 2022-03-10 DIAGNOSIS — Z87891 Personal history of nicotine dependence: Secondary | ICD-10-CM | POA: Diagnosis not present

## 2022-03-10 DIAGNOSIS — J9601 Acute respiratory failure with hypoxia: Secondary | ICD-10-CM | POA: Diagnosis not present

## 2022-03-10 DIAGNOSIS — Z9181 History of falling: Secondary | ICD-10-CM | POA: Diagnosis not present

## 2022-03-10 NOTE — Progress Notes (Signed)
Reviewed CT which shows treatment response.   Oncology Nurse Navigator Documentation     03/10/2022    7:30 AM  Oncology Nurse Navigator Flowsheets  Navigator Follow Up Date: 03/18/2022  Navigator Follow Up Reason: Follow-up Appointment;Chemotherapy  Navigator Location [facility]  Navigator Encounter Type Scan Review  Patient Visit Type MedOnc  Treatment Phase Active Tx  Barriers/Navigation Needs Coordination of Care;Education  Interventions None Required  Acuity Level 2-Minimal Needs (1-2 Barriers Identified)  Support Groups/Services Friends and Family  Time Spent with Patient 15

## 2022-03-14 ENCOUNTER — Encounter: Payer: Self-pay | Admitting: *Deleted

## 2022-03-15 ENCOUNTER — Telehealth: Payer: Self-pay | Admitting: Oncology

## 2022-03-15 ENCOUNTER — Other Ambulatory Visit: Payer: Self-pay | Admitting: Hematology & Oncology

## 2022-03-15 NOTE — Telephone Encounter (Signed)
Maria Romero and scheduled a follow up appointment on 04/05/22 at 8:30 with Dr. Berline Lopes to discuss surgery.  Also discussed that 04/20/21 is being held for possible surgery.  She verbalized understanding and agreement of the appointment dates and times.

## 2022-03-16 ENCOUNTER — Encounter: Payer: Self-pay | Admitting: *Deleted

## 2022-03-16 NOTE — Progress Notes (Signed)
Received notification from surgical gyn-onc that patient is planned to have surgery on 04/20/22. As such, her Avastin will need to be held.   Notified out pharmacy team, and spoke to Dr Marin Olp to confirm that he is aware of plan.   Oncology Nurse Navigator Documentation     03/16/2022    8:30 AM  Oncology Nurse Navigator Flowsheets  Navigator Follow Up Date: 03/18/2022  Navigator Follow Up Reason: Follow-up Appointment;Chemotherapy  Navigator Location [facility]  Navigator Encounter Type Appt/Treatment Plan Review  Patient Visit Type MedOnc  Treatment Phase Active Tx  Barriers/Navigation Needs Coordination of Care  Interventions Coordination of Care  Acuity Level 2-Minimal Needs (1-2 Barriers Identified)  Coordination of Care Other  Support Groups/Services Friends and Family  Time Spent with Patient 15

## 2022-03-16 NOTE — Progress Notes (Signed)
Bevacizumab careplan placed on hold per Dr. Antonieta Pert instructions. Patient has upcoming surgery.

## 2022-03-17 ENCOUNTER — Other Ambulatory Visit: Payer: Self-pay | Admitting: Hematology & Oncology

## 2022-03-17 DIAGNOSIS — Z87891 Personal history of nicotine dependence: Secondary | ICD-10-CM | POA: Diagnosis not present

## 2022-03-17 DIAGNOSIS — Z9181 History of falling: Secondary | ICD-10-CM | POA: Diagnosis not present

## 2022-03-17 DIAGNOSIS — J9601 Acute respiratory failure with hypoxia: Secondary | ICD-10-CM | POA: Diagnosis not present

## 2022-03-17 DIAGNOSIS — I7300 Raynaud's syndrome without gangrene: Secondary | ICD-10-CM | POA: Diagnosis not present

## 2022-03-17 DIAGNOSIS — K589 Irritable bowel syndrome without diarrhea: Secondary | ICD-10-CM | POA: Diagnosis not present

## 2022-03-17 DIAGNOSIS — E7800 Pure hypercholesterolemia, unspecified: Secondary | ICD-10-CM | POA: Diagnosis not present

## 2022-03-17 DIAGNOSIS — M810 Age-related osteoporosis without current pathological fracture: Secondary | ICD-10-CM | POA: Diagnosis not present

## 2022-03-17 DIAGNOSIS — E440 Moderate protein-calorie malnutrition: Secondary | ICD-10-CM | POA: Diagnosis not present

## 2022-03-17 DIAGNOSIS — D709 Neutropenia, unspecified: Secondary | ICD-10-CM | POA: Diagnosis not present

## 2022-03-17 DIAGNOSIS — Z95828 Presence of other vascular implants and grafts: Secondary | ICD-10-CM | POA: Diagnosis not present

## 2022-03-17 DIAGNOSIS — C561 Malignant neoplasm of right ovary: Secondary | ICD-10-CM

## 2022-03-17 DIAGNOSIS — N179 Acute kidney failure, unspecified: Secondary | ICD-10-CM | POA: Diagnosis not present

## 2022-03-17 DIAGNOSIS — Z6820 Body mass index (BMI) 20.0-20.9, adult: Secondary | ICD-10-CM | POA: Diagnosis not present

## 2022-03-17 DIAGNOSIS — R188 Other ascites: Secondary | ICD-10-CM | POA: Diagnosis not present

## 2022-03-17 DIAGNOSIS — R1312 Dysphagia, oropharyngeal phase: Secondary | ICD-10-CM | POA: Diagnosis not present

## 2022-03-17 DIAGNOSIS — I10 Essential (primary) hypertension: Secondary | ICD-10-CM | POA: Diagnosis not present

## 2022-03-18 ENCOUNTER — Encounter: Payer: Self-pay | Admitting: Hematology & Oncology

## 2022-03-18 ENCOUNTER — Inpatient Hospital Stay (HOSPITAL_BASED_OUTPATIENT_CLINIC_OR_DEPARTMENT_OTHER): Payer: Medicare Other | Admitting: Hematology & Oncology

## 2022-03-18 ENCOUNTER — Inpatient Hospital Stay: Payer: Medicare Other | Attending: Hematology & Oncology

## 2022-03-18 ENCOUNTER — Other Ambulatory Visit: Payer: Self-pay | Admitting: *Deleted

## 2022-03-18 ENCOUNTER — Encounter: Payer: Self-pay | Admitting: *Deleted

## 2022-03-18 ENCOUNTER — Other Ambulatory Visit: Payer: Self-pay

## 2022-03-18 ENCOUNTER — Inpatient Hospital Stay: Payer: Medicare Other

## 2022-03-18 VITALS — BP 137/65 | HR 79 | Temp 98.1°F | Resp 17

## 2022-03-18 DIAGNOSIS — D702 Other drug-induced agranulocytosis: Secondary | ICD-10-CM

## 2022-03-18 DIAGNOSIS — Z803 Family history of malignant neoplasm of breast: Secondary | ICD-10-CM | POA: Diagnosis not present

## 2022-03-18 DIAGNOSIS — R188 Other ascites: Secondary | ICD-10-CM | POA: Insufficient documentation

## 2022-03-18 DIAGNOSIS — I10 Essential (primary) hypertension: Secondary | ICD-10-CM | POA: Diagnosis not present

## 2022-03-18 DIAGNOSIS — C561 Malignant neoplasm of right ovary: Secondary | ICD-10-CM

## 2022-03-18 DIAGNOSIS — N6311 Unspecified lump in the right breast, upper outer quadrant: Secondary | ICD-10-CM

## 2022-03-18 DIAGNOSIS — Z87891 Personal history of nicotine dependence: Secondary | ICD-10-CM | POA: Insufficient documentation

## 2022-03-18 DIAGNOSIS — Z5111 Encounter for antineoplastic chemotherapy: Secondary | ICD-10-CM | POA: Insufficient documentation

## 2022-03-18 DIAGNOSIS — Z5189 Encounter for other specified aftercare: Secondary | ICD-10-CM | POA: Diagnosis not present

## 2022-03-18 DIAGNOSIS — I7300 Raynaud's syndrome without gangrene: Secondary | ICD-10-CM | POA: Diagnosis not present

## 2022-03-18 DIAGNOSIS — D6481 Anemia due to antineoplastic chemotherapy: Secondary | ICD-10-CM

## 2022-03-18 DIAGNOSIS — Z95828 Presence of other vascular implants and grafts: Secondary | ICD-10-CM

## 2022-03-18 DIAGNOSIS — R21 Rash and other nonspecific skin eruption: Secondary | ICD-10-CM | POA: Insufficient documentation

## 2022-03-18 LAB — CBC WITH DIFFERENTIAL (CANCER CENTER ONLY)
Abs Immature Granulocytes: 0.06 10*3/uL (ref 0.00–0.07)
Basophils Absolute: 0 10*3/uL (ref 0.0–0.1)
Basophils Relative: 0 %
Eosinophils Absolute: 0 10*3/uL (ref 0.0–0.5)
Eosinophils Relative: 0 %
HCT: 29.9 % — ABNORMAL LOW (ref 36.0–46.0)
Hemoglobin: 9.5 g/dL — ABNORMAL LOW (ref 12.0–15.0)
Immature Granulocytes: 1 %
Lymphocytes Relative: 26 %
Lymphs Abs: 1.1 10*3/uL (ref 0.7–4.0)
MCH: 32.3 pg (ref 26.0–34.0)
MCHC: 31.8 g/dL (ref 30.0–36.0)
MCV: 101.7 fL — ABNORMAL HIGH (ref 80.0–100.0)
Monocytes Absolute: 0.4 10*3/uL (ref 0.1–1.0)
Monocytes Relative: 8 %
Neutro Abs: 2.9 10*3/uL (ref 1.7–7.7)
Neutrophils Relative %: 65 %
Platelet Count: 256 10*3/uL (ref 150–400)
RBC: 2.94 MIL/uL — ABNORMAL LOW (ref 3.87–5.11)
RDW: 22.4 % — ABNORMAL HIGH (ref 11.5–15.5)
WBC Count: 4.4 10*3/uL (ref 4.0–10.5)
nRBC: 0 % (ref 0.0–0.2)

## 2022-03-18 LAB — CMP (CANCER CENTER ONLY)
ALT: 10 U/L (ref 0–44)
AST: 14 U/L — ABNORMAL LOW (ref 15–41)
Albumin: 3.9 g/dL (ref 3.5–5.0)
Alkaline Phosphatase: 82 U/L (ref 38–126)
Anion gap: 11 (ref 5–15)
BUN: 15 mg/dL (ref 8–23)
CO2: 24 mmol/L (ref 22–32)
Calcium: 9.5 mg/dL (ref 8.9–10.3)
Chloride: 106 mmol/L (ref 98–111)
Creatinine: 0.88 mg/dL (ref 0.44–1.00)
GFR, Estimated: >60 mL/min (ref >60)
Glucose, Bld: 118 mg/dL — ABNORMAL HIGH (ref 70–99)
Potassium: 3.7 mmol/L (ref 3.5–5.1)
Sodium: 141 mmol/L (ref 135–145)
Total Bilirubin: 0.3 mg/dL (ref 0.3–1.2)
Total Protein: 6.7 g/dL (ref 6.5–8.1)

## 2022-03-18 LAB — FERRITIN: Ferritin: 651 ng/mL — ABNORMAL HIGH (ref 11–307)

## 2022-03-18 LAB — SAMPLE TO BLOOD BANK

## 2022-03-18 LAB — IRON AND IRON BINDING CAPACITY (CC-WL,HP ONLY)
Iron: 61 ug/dL (ref 28–170)
Saturation Ratios: 25 % (ref 10.4–31.8)
TIBC: 244 ug/dL — ABNORMAL LOW (ref 250–450)
UIBC: 183 ug/dL (ref 148–442)

## 2022-03-18 LAB — RETICULOCYTES
Immature Retic Fract: 19.7 % — ABNORMAL HIGH (ref 2.3–15.9)
RBC.: 2.92 MIL/uL — ABNORMAL LOW (ref 3.87–5.11)
Retic Count, Absolute: 86.7 10*3/uL (ref 19.0–186.0)
Retic Ct Pct: 3 % (ref 0.4–3.1)

## 2022-03-18 LAB — LACTATE DEHYDROGENASE: LDH: 167 U/L (ref 98–192)

## 2022-03-18 MED ORDER — PALONOSETRON HCL INJECTION 0.25 MG/5ML
0.2500 mg | Freq: Once | INTRAVENOUS | Status: AC
  Administered 2022-03-18: 0.25 mg via INTRAVENOUS
  Filled 2022-03-18: qty 5

## 2022-03-18 MED ORDER — SODIUM CHLORIDE 0.9 % IV SOLN: Freq: Once | INTRAVENOUS | Status: AC

## 2022-03-18 MED ORDER — SODIUM CHLORIDE 0.9 % IV SOLN
10.0000 mg | Freq: Once | INTRAVENOUS | Status: AC
  Administered 2022-03-18: 10 mg via INTRAVENOUS
  Filled 2022-03-18: qty 10

## 2022-03-18 MED ORDER — HEPARIN SOD (PORK) LOCK FLUSH 100 UNIT/ML IV SOLN
500.0000 [IU] | Freq: Once | INTRAVENOUS | Status: AC | PRN
  Administered 2022-03-18: 500 [IU]

## 2022-03-18 MED ORDER — SODIUM CHLORIDE 0.9 % IV SOLN
50.0000 mg/m2 | Freq: Once | INTRAVENOUS | Status: AC
  Administered 2022-03-18: 80 mg via INTRAVENOUS
  Filled 2022-03-18: qty 8

## 2022-03-18 MED ORDER — SODIUM CHLORIDE 0.9 % IV SOLN
150.0000 mg | Freq: Once | INTRAVENOUS | Status: AC
  Administered 2022-03-18: 150 mg via INTRAVENOUS
  Filled 2022-03-18: qty 150

## 2022-03-18 MED ORDER — SODIUM CHLORIDE 0.9% FLUSH
10.0000 mL | INTRAVENOUS | Status: DC | PRN
  Administered 2022-03-18: 10 mL

## 2022-03-18 MED ORDER — SODIUM CHLORIDE 0.9 % IV SOLN
350.0000 mg | Freq: Once | INTRAVENOUS | Status: AC
  Administered 2022-03-18: 350 mg via INTRAVENOUS
  Filled 2022-03-18: qty 35

## 2022-03-18 NOTE — Progress Notes (Signed)
Hematology and Oncology Follow Up Visit  Maria Romero [id number] March 01, 1951 71 y.o. 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  She is doing quite well.  We did do a CT scan on her.  The CT scan was done on 03/08/2022.  This did show that she was responding.  There is reduction in the omental caking.  The right adnexal mass had shrunk in size.  There is really no, if any ascites.  There was resolution of the right pleural effusion.  I does happy that she is responding.  Her last CA-125 was down to 369.  Is speaking to her gynecologic oncologist, she felt that we need to get a little bit better response.  As such, we are going to add Taxotere to the carboplatinum.  We will drop the Avastin.  I think that with low-dose Taxotere, we can get a little bit more of a response.  I talked to Maria Romero about this.  I explained why we needed to add the Taxotere.  Our goal is still to get her to surgery.  We just want her to have a better response so that surgical resection would be enhanced and be more effective.  She had a very good Thanksgiving.  She actually cooked Thanksgiving dinner and had a wonderful time.  She has had no problems with fever.  She still has a little bit of numbness on top of her right foot.  She has had no constipation or diarrhea.  There is been no bleeding.  Overall, I would say performance status is probably ECOG 1.    Wt Readings from Last 3 Encounters:  03/18/22 126 lb (57.2 kg)  02/24/22 126 lb (57.2 kg)  02/01/22 126 lb 0.6 oz (57.2 kg)    Medications:  Current Outpatient Medications:    atorvastatin (LIPITOR) 40 MG tablet, TAKE ONE TABLET  BY MOUTH EVERY NIGHT AT BEDTIME, Disp: 90 tablet, Rfl: 3   Calcium-Vitamin D-Vitamin K (VIACTIV PO), Take by mouth See admin instructions. Chew 1 square by mouth once a day- for calcium, Disp: , Rfl:    cholecalciferol (VITAMIN D3) 25 MCG (1000 UNIT) tablet, Take 1,000 Units by mouth daily., Disp: , Rfl:    dexamethasone (DECADRON) 4 MG tablet, Take 2 tablets by mouth starting the day after chemotherapy for 3 days. Take with food., Disp: 30 tablet, Rfl: 1   magic mouthwash (nystatin, lidocaine, diphenhydrAMINE, alum & mag hydroxide) suspension, Swish and spit 5 mLs 4 (four) times daily as needed for mouth pain., Disp: 240 mL, Rfl: 1   midodrine (PROAMATINE) 10 MG tablet, Take 1 tablet (10 mg total) by mouth 3 (three) times daily with meals., Disp: 90 tablet, Rfl: 1   omeprazole (PRILOSEC) 20 MG capsule, Take 1 capsule (20 mg total) by mouth daily., Disp: 90 capsule, Rfl: 2   ondansetron (ZOFRAN) 8 MG tablet, Take 1 tablet (8 mg total) by mouth every 8 (eight) hours as needed for nausea or vomiting. Start on the third day after chemotherapy., Disp: 30 tablet, Rfl: 1   PRILOSEC OTC 20 MG tablet, Take 20 mg by mouth daily before breakfast. (Patient not taking: Reported on 01/21/2022), Disp: , Rfl:    prochlorperazine (COMPAZINE)  10 MG tablet, Take 1 tablet (10 mg total) by mouth every 6 (six) hours as needed for nausea or vomiting., Disp: 30 tablet, Rfl: 1  Allergies:  Allergies  Allergen Reactions   Fluvastatin Sodium Other (See Comments)    Myalgias    Past Medical History, Surgical history, Social history, and Family History were reviewed and updated.  Review of Systems: Review of Systems  Constitutional: Negative.   HENT:  Negative.    Eyes: Negative.   Respiratory: Negative.    Cardiovascular: Negative.   Gastrointestinal:  Negative for abdominal pain.  Genitourinary:  Negative for pelvic pain.   Musculoskeletal: Negative.   Skin: Negative.   Neurological: Negative.   Hematological:  Negative.   Psychiatric/Behavioral: Negative.      Physical Exam:  height is _0  (1.6 m) and weight is 126 lb (57.2 kg). Her oral temperature is 97.5 F (36.4 C) (abnormal). Her blood pressure is 135/74 and her pulse is 91. Her respiration is 19 and oxygen saturation is 100%.   Wt Readings from Last 3 Encounters:  03/18/22 126 lb (57.2 kg)  02/24/22 126 lb (57.2 kg)  02/01/22 126 lb 0.6 oz (57.2 kg)    Physical Exam Vitals reviewed.  HENT:     Head: Normocephalic and atraumatic.  Eyes:     Pupils: Pupils are equal, round, and reactive to light.  Cardiovascular:     Rate and Rhythm: Normal rate and regular rhythm.     Heart sounds: Normal heart sounds.  Pulmonary:     Effort: Pulmonary effort is normal.     Breath sounds: Normal breath sounds.  Chest:    Abdominal:     General: Bowel sounds are normal.     Palpations: Abdomen is soft.     Comments: Abdominal exam is scantly distended  Musculoskeletal:        General: No tenderness or deformity. Normal range of motion.     Cervical back: Normal range of motion.  Lymphadenopathy:     Cervical: No cervical adenopathy.  Skin:    General: Skin is warm and dry.     Findings: No erythema or rash.  Neurological:     Mental Status: She is alert and oriented to person, place, and time.  Psychiatric:        Behavior: Behavior normal.        Thought Content: Thought content normal.        Judgment: Judgment normal.     Lab Results  Component Value Date   WBC 4.4 03/18/2022   HGB 9.5 (L) 03/18/2022   HCT 29.9 (L) 03/18/2022   MCV 101.7 (H) 03/18/2022   PLT 256 03/18/2022     Chemistry      Component Value Date/Time   NA 141 03/18/2022 1009   K 3.7 03/18/2022 1009   CL 106 03/18/2022 1009   CO2 24 03/18/2022 1009   BUN 15 03/18/2022 1009   CREATININE 0.88 03/18/2022 1009   CREATININE 0.93 11/03/2021 0000      Component Value Date/Time   CALCIUM 9.5 03/18/2022 1009   ALKPHOS 82 03/18/2022 1009   AST 14 (L)  03/18/2022 1009   ALT 10 03/18/2022 1009   BILITOT 0.3 03/18/2022 1009     Encounter Diagnosis  Name Primary?   [name], right ([place])     Impression and Plan: Maria Romero is a very charming 71 year old white female.  Looks like she has stage IIIC ovarian cancer.  Again, she is responding.  We would like to get a better response.  I think by adding low-dose Taxotere to the carboplatinum, we can do this.  I know that when we first started treating her, she had a horrible time with Taxol.  She was in the hospital for over 2 weeks.  She finally began to improve.  I know that the CA-125 will definitely shows how well she is doing.  I think she sees Dr. Berline Lopes, [facility], in a couple weeks.  We will go ahead and try to get her back in 3 weeks for her next cycle.  I probably will have to see her back in 10 days to make sure that her blood counts are not too bad.  We will make sure she gets Neulasta.   Volanda Napoleon, MD 12/8/202310:59 AM

## 2022-03-18 NOTE — Patient Instructions (Signed)

## 2022-03-18 NOTE — Patient Instructions (Signed)
[facility]  Discharge Instructions: Thank you for choosing [facility] to provide your oncology and hematology care.   If you have a lab appointment with the [facility], please go directly to the [facility] and check in at the registration area.  Wear comfortable clothing and clothing appropriate for easy access to any Portacath or PICC line.   We strive to give you quality time with your provider. You may need to reschedule your appointment if you arrive late (15 or more minutes).  Arriving late affects you and other patients whose appointments are after yours.  Also, if you miss three or more appointments without notifying the office, you may be dismissed from the clinic at the provider's discretion.      For prescription refill requests, have your pharmacy contact our office and allow 72 hours for refills to be completed.    Today you received the following chemotherapy and/or immunotherapy agents Taxotere and Carboplatin      To help prevent nausea and vomiting after your treatment, we encourage you to take your nausea medication as directed.  BELOW ARE SYMPTOMS THAT SHOULD BE REPORTED IMMEDIATELY: *FEVER GREATER THAN 100.4 F (38 C) OR HIGHER *CHILLS OR SWEATING *NAUSEA AND VOMITING THAT IS NOT CONTROLLED WITH YOUR NAUSEA MEDICATION *UNUSUAL SHORTNESS OF BREATH *UNUSUAL BRUISING OR BLEEDING *URINARY PROBLEMS (pain or burning when urinating, or frequent urination) *BOWEL PROBLEMS (unusual diarrhea, constipation, pain near the anus) TENDERNESS IN MOUTH AND THROAT WITH OR WITHOUT PRESENCE OF ULCERS (sore throat, sores in mouth, or a toothache) UNUSUAL RASH, SWELLING OR PAIN  UNUSUAL VAGINAL DISCHARGE OR ITCHING   Items with * indicate a potential emergency and should be followed up as soon as possible or go to the Emergency Department if any problems should occur.  Please show the CHEMOTHERAPY ALERT CARD or IMMUNOTHERAPY ALERT CARD at  check-in to the Emergency Department and triage nurse. Should you have questions after your visit or need to cancel or reschedule your appointment, please contact [facility]  [phone] and follow the prompts.  Office hours are 8:00 a.m. to 4:30 p.m. Monday - Friday. Please note that voicemails left after 4:00 p.m. may not be returned until the following business day.  We are closed weekends and major holidays. You have access to a nurse at all times for urgent questions. Please call the main number to the clinic [phone] and follow the prompts.  For any non-urgent questions, you may also contact your provider using MyChart. We now offer e-Visits for anyone 70 and older to request care online for non-urgent symptoms. For details visit [email or web address].   Also download the MyChart app! Go to the app store, search "MyChart", open the app, select [facility], and log in with your MyChart username and password.  Masks are optional in the cancer centers. If you would like for your care team to wear a mask while they are taking care of you, please let them know. You may have one support person who is at least 73 years old accompany you for your appointments.

## 2022-03-18 NOTE — Progress Notes (Signed)
Ok to adjust chemo doses based on pt current wt per Dr Marin Olp.

## 2022-03-18 NOTE — Progress Notes (Signed)
Patient tentatively scheduled for surgery on 04/20/2022. She will get treated today, and Dr Marin Olp hopes in get one more cycle in before that time. With the addition of taxotere, patient will come in for toxicity check in about one week.   Oncology Nurse Navigator Documentation     03/18/2022   10:00 AM  Oncology Nurse Navigator Flowsheets  Navigator Follow Up Date: 04/07/2022  Navigator Follow Up Reason: Follow-up Appointment;Chemotherapy  Navigator Location [facility]  Navigator Encounter Type Treatment;Appt/Treatment Plan Review  Patient Visit Type MedOnc  Treatment Phase Active Tx  Barriers/Navigation Needs Coordination of Care  Interventions Psycho-Social Support  Acuity Level 2-Minimal Needs (1-2 Barriers Identified)  Support Groups/Services Friends and Family  Time Spent with Patient 15

## 2022-03-19 LAB — CA 125: Cancer Antigen (CA) 125: 280 U/mL — ABNORMAL HIGH (ref 0.0–38.1)

## 2022-03-20 ENCOUNTER — Other Ambulatory Visit: Payer: Self-pay

## 2022-03-21 ENCOUNTER — Encounter: Payer: Self-pay | Admitting: Family Medicine

## 2022-03-21 ENCOUNTER — Other Ambulatory Visit: Payer: Self-pay

## 2022-03-21 ENCOUNTER — Ambulatory Visit (INDEPENDENT_AMBULATORY_CARE_PROVIDER_SITE_OTHER): Payer: Medicare Other | Admitting: Family Medicine

## 2022-03-21 ENCOUNTER — Telehealth: Payer: Self-pay | Admitting: Family Medicine

## 2022-03-21 VITALS — BP 132/50 | HR 77 | Ht 63.0 in | Wt 129.0 lb

## 2022-03-21 DIAGNOSIS — I10 Essential (primary) hypertension: Secondary | ICD-10-CM | POA: Diagnosis not present

## 2022-03-21 DIAGNOSIS — E7800 Pure hypercholesterolemia, unspecified: Secondary | ICD-10-CM | POA: Diagnosis not present

## 2022-03-21 DIAGNOSIS — I7300 Raynaud's syndrome without gangrene: Secondary | ICD-10-CM

## 2022-03-21 NOTE — Assessment & Plan Note (Signed)
Lipid order placed today she is can to try to get it done when she has her other blood work done at the cancer center.

## 2022-03-21 NOTE — Assessment & Plan Note (Addendum)
Would like to be able to restart the amlodipine 5 mg this need to double check with her current chemotherapy regimen that it is safe to do so.  It looks like the only medication that it could interact with is the Taxotere and that they recommend monitoring CBC.  She gets that monitored pretty routinely so I think at this point it may be reasonable to restart the amlodipine.  She says she still has plenty of supply at home and would like to use that up first before we send in a new prescription.

## 2022-03-21 NOTE — Assessment & Plan Note (Signed)
Blood pressure looks great today back on her valsartan 160 mg daily.  Will just need to monitor for low blood pressures if we add back amlodipine but even if she has a drop of about 5 points she should be good.

## 2022-03-21 NOTE — Progress Notes (Signed)
Established Patient Office Visit  Subjective   Patient ID: Maria Romero, female    DOB: 1950/08/30  Age: 71 y.o. MRN: [id number]  Chief Complaint  Patient presents with   Follow-up         HPI   F/U BP -blood pressures finally normalized and she was able to restart the valsartan.    Her Raynaud's has been flaring more since the cold winter months it started.  She would really like to get back on her amlodipine but wants to make sure that it safe to do so with her current chemotherapy treatment.  She still has a 5 mg tabs at home.  The lower dose is never really worked well for her.    She unfortunately still has some residual neuropathy in her right ankle and some weakness with dorsiflexion leftover from her initial chemo treatments that ended up causing her to be hospitalized.  She has been doing much better with her new regimen and in fact they just added a new chemotherapeutic agent this past week.  Tumor markers are going down and the tumors are shrinking.  The hope is that she will be able to actually have surgery at the end of January.  She is dealing with some anemia right now.  There her white blood cell count has actually maintained.  He lost a significant amount of weight initially but has been slowly regaining that and doing well with eating regularly.  Does still have some lower abdominal discomfort particularly in that right lower quadrant.  No longer having to do paracentesis to draw off extra fluid.     ROS    Objective:     BP (!) 132/50   Pulse 77   Ht '5\' 3"'$  (1.6 m)   Wt 129 lb (58.5 kg)   SpO2 100%   BMI 22.85 kg/m    Physical Exam Vitals and nursing note reviewed.  Constitutional:      Appearance: She is well-developed.  HENT:     Head: Normocephalic and atraumatic.  Cardiovascular:     Rate and Rhythm: Normal rate and regular rhythm.     Heart sounds: Normal heart sounds.  Pulmonary:     Effort: Pulmonary effort is normal.     Breath  sounds: Normal breath sounds.  Skin:    General: Skin is warm and dry.  Neurological:     Mental Status: She is alert and oriented to person, place, and time.  Psychiatric:        Behavior: Behavior normal.      No results found for any visits on 03/21/22.    The 10-year ASCVD risk score (Arnett DK, et al., 2019) is: 17.6%    Assessment & Plan:   Problem List Items Addressed This Visit       Cardiovascular and Mediastinum   Raynaud disease    Would like to be able to restart the amlodipine 5 mg this need to double check with her current chemotherapy regimen that it is safe to do so.  It looks like the only medication that it could interact with is the Taxotere and that they recommend monitoring CBC.  She gets that monitored pretty routinely so I think at this point it may be reasonable to restart the amlodipine.  She says she still has plenty of supply at home and would like to use that up first before we send in a new prescription.      Relevant Medications   valsartan (  DIOVAN) 160 MG tablet   Essential hypertension    Blood pressure looks great today back on her valsartan 160 mg daily.  Will just need to monitor for low blood pressures if we add back amlodipine but even if she has a drop of about 5 points she should be good.      Relevant Medications   valsartan (DIOVAN) 160 MG tablet     Other   HYPERCHOLESTEROLEMIA - Primary    Lipid order placed today she is can to try to get it done when she has her other blood work done at the cancer center.      Relevant Medications   valsartan (DIOVAN) 160 MG tablet   Other Relevant Orders   Lipid Panel w/reflex Direct LDL    Return if symptoms worsen or fail to improve.    Beatrice Lecher, MD

## 2022-03-21 NOTE — Progress Notes (Signed)
Pt would like to ask Dr. Madilyn Fireman about restarting the Amlodipine, Raynauds and if she needs labs she has a port and will be going on the 20th for labwork.

## 2022-03-22 ENCOUNTER — Other Ambulatory Visit: Payer: Self-pay

## 2022-03-22 DIAGNOSIS — R188 Other ascites: Secondary | ICD-10-CM | POA: Diagnosis not present

## 2022-03-22 DIAGNOSIS — Z6820 Body mass index (BMI) 20.0-20.9, adult: Secondary | ICD-10-CM | POA: Diagnosis not present

## 2022-03-22 DIAGNOSIS — Z95828 Presence of other vascular implants and grafts: Secondary | ICD-10-CM | POA: Diagnosis not present

## 2022-03-22 DIAGNOSIS — I10 Essential (primary) hypertension: Secondary | ICD-10-CM | POA: Diagnosis not present

## 2022-03-22 DIAGNOSIS — Z87891 Personal history of nicotine dependence: Secondary | ICD-10-CM | POA: Diagnosis not present

## 2022-03-22 DIAGNOSIS — N179 Acute kidney failure, unspecified: Secondary | ICD-10-CM | POA: Diagnosis not present

## 2022-03-22 DIAGNOSIS — K589 Irritable bowel syndrome without diarrhea: Secondary | ICD-10-CM | POA: Diagnosis not present

## 2022-03-22 DIAGNOSIS — E440 Moderate protein-calorie malnutrition: Secondary | ICD-10-CM | POA: Diagnosis not present

## 2022-03-22 DIAGNOSIS — M810 Age-related osteoporosis without current pathological fracture: Secondary | ICD-10-CM | POA: Diagnosis not present

## 2022-03-22 DIAGNOSIS — I7300 Raynaud's syndrome without gangrene: Secondary | ICD-10-CM | POA: Diagnosis not present

## 2022-03-22 DIAGNOSIS — J9601 Acute respiratory failure with hypoxia: Secondary | ICD-10-CM | POA: Diagnosis not present

## 2022-03-22 DIAGNOSIS — R1312 Dysphagia, oropharyngeal phase: Secondary | ICD-10-CM | POA: Diagnosis not present

## 2022-03-22 DIAGNOSIS — E7800 Pure hypercholesterolemia, unspecified: Secondary | ICD-10-CM | POA: Diagnosis not present

## 2022-03-22 DIAGNOSIS — D709 Neutropenia, unspecified: Secondary | ICD-10-CM | POA: Diagnosis not present

## 2022-03-22 DIAGNOSIS — Z9181 History of falling: Secondary | ICD-10-CM | POA: Diagnosis not present

## 2022-03-23 ENCOUNTER — Inpatient Hospital Stay: Payer: Medicare Other

## 2022-03-23 ENCOUNTER — Other Ambulatory Visit: Payer: Self-pay | Admitting: Hematology & Oncology

## 2022-03-23 VITALS — BP 144/67 | HR 80 | Temp 97.8°F | Resp 17

## 2022-03-23 DIAGNOSIS — D702 Other drug-induced agranulocytosis: Secondary | ICD-10-CM

## 2022-03-23 DIAGNOSIS — Z5189 Encounter for other specified aftercare: Secondary | ICD-10-CM | POA: Diagnosis not present

## 2022-03-23 DIAGNOSIS — C561 Malignant neoplasm of right ovary: Secondary | ICD-10-CM

## 2022-03-23 DIAGNOSIS — R188 Other ascites: Secondary | ICD-10-CM | POA: Diagnosis not present

## 2022-03-23 DIAGNOSIS — I10 Essential (primary) hypertension: Secondary | ICD-10-CM | POA: Diagnosis not present

## 2022-03-23 DIAGNOSIS — Z5111 Encounter for antineoplastic chemotherapy: Secondary | ICD-10-CM | POA: Diagnosis not present

## 2022-03-23 DIAGNOSIS — I7300 Raynaud's syndrome without gangrene: Secondary | ICD-10-CM | POA: Diagnosis not present

## 2022-03-23 DIAGNOSIS — R21 Rash and other nonspecific skin eruption: Secondary | ICD-10-CM | POA: Diagnosis not present

## 2022-03-23 DIAGNOSIS — Z87891 Personal history of nicotine dependence: Secondary | ICD-10-CM | POA: Diagnosis not present

## 2022-03-23 DIAGNOSIS — Z803 Family history of malignant neoplasm of breast: Secondary | ICD-10-CM | POA: Diagnosis not present

## 2022-03-23 MED ORDER — ACYCLOVIR 200 MG PO CAPS: 200.0000 mg | ORAL_CAPSULE | Freq: Three times a day (TID) | ORAL | 1 refills | Status: DC

## 2022-03-23 MED ORDER — PEGFILGRASTIM INJECTION 6 MG/0.6ML ~~LOC~~
6.0000 mg | PREFILLED_SYRINGE | Freq: Once | SUBCUTANEOUS | Status: AC
  Administered 2022-03-23: 6 mg via SUBCUTANEOUS
  Filled 2022-03-23: qty 0.6

## 2022-03-23 MED ORDER — AMOXICILLIN-POT CLAVULANATE 875-125 MG PO TABS: 1.0000 | ORAL_TABLET | Freq: Two times a day (BID) | ORAL | 2 refills | Status: DC

## 2022-03-23 NOTE — Patient Instructions (Signed)

## 2022-03-25 NOTE — Telephone Encounter (Signed)
Agree with documentation as above.   Liborio Saccente, MD  

## 2022-03-28 ENCOUNTER — Telehealth: Payer: Self-pay | Admitting: Oncology

## 2022-03-28 NOTE — Telephone Encounter (Signed)
Maria Romero and rescheduled her appointment with Dr. Berline Lopes on 04/05/22 to 12:30.  She verbalized understanding and agreement of new appointment time.

## 2022-03-30 ENCOUNTER — Telehealth: Payer: Self-pay

## 2022-03-30 ENCOUNTER — Inpatient Hospital Stay (HOSPITAL_BASED_OUTPATIENT_CLINIC_OR_DEPARTMENT_OTHER): Payer: Medicare Other | Admitting: Family

## 2022-03-30 ENCOUNTER — Inpatient Hospital Stay: Payer: Medicare Other

## 2022-03-30 ENCOUNTER — Encounter: Payer: Self-pay | Admitting: Family

## 2022-03-30 ENCOUNTER — Inpatient Hospital Stay: Payer: Medicare Other | Admitting: Licensed Clinical Social Worker

## 2022-03-30 ENCOUNTER — Other Ambulatory Visit: Payer: Self-pay | Admitting: Family Medicine

## 2022-03-30 VITALS — BP 95/75 | HR 90 | Temp 97.5°F | Resp 17 | Wt 124.0 lb

## 2022-03-30 DIAGNOSIS — Z803 Family history of malignant neoplasm of breast: Secondary | ICD-10-CM | POA: Diagnosis not present

## 2022-03-30 DIAGNOSIS — R188 Other ascites: Secondary | ICD-10-CM | POA: Diagnosis not present

## 2022-03-30 DIAGNOSIS — Z87891 Personal history of nicotine dependence: Secondary | ICD-10-CM | POA: Diagnosis not present

## 2022-03-30 DIAGNOSIS — C561 Malignant neoplasm of right ovary: Secondary | ICD-10-CM | POA: Diagnosis not present

## 2022-03-30 DIAGNOSIS — D6481 Anemia due to antineoplastic chemotherapy: Secondary | ICD-10-CM

## 2022-03-30 DIAGNOSIS — I7300 Raynaud's syndrome without gangrene: Secondary | ICD-10-CM | POA: Diagnosis not present

## 2022-03-30 DIAGNOSIS — Z5189 Encounter for other specified aftercare: Secondary | ICD-10-CM | POA: Diagnosis not present

## 2022-03-30 DIAGNOSIS — R21 Rash and other nonspecific skin eruption: Secondary | ICD-10-CM | POA: Diagnosis not present

## 2022-03-30 DIAGNOSIS — Z5111 Encounter for antineoplastic chemotherapy: Secondary | ICD-10-CM | POA: Diagnosis not present

## 2022-03-30 DIAGNOSIS — E7800 Pure hypercholesterolemia, unspecified: Secondary | ICD-10-CM | POA: Diagnosis not present

## 2022-03-30 DIAGNOSIS — I10 Essential (primary) hypertension: Secondary | ICD-10-CM | POA: Diagnosis not present

## 2022-03-30 LAB — CBC WITH DIFFERENTIAL (CANCER CENTER ONLY)
Abs Immature Granulocytes: 0.14 10*3/uL — ABNORMAL HIGH (ref 0.00–0.07)
Basophils Absolute: 0.1 10*3/uL (ref 0.0–0.1)
Basophils Relative: 1 %
Eosinophils Absolute: 0 10*3/uL (ref 0.0–0.5)
Eosinophils Relative: 0 %
HCT: 30.2 % — ABNORMAL LOW (ref 36.0–46.0)
Hemoglobin: 9.7 g/dL — ABNORMAL LOW (ref 12.0–15.0)
Immature Granulocytes: 1 %
Lymphocytes Relative: 15 %
Lymphs Abs: 1.9 10*3/uL (ref 0.7–4.0)
MCH: 32.9 pg (ref 26.0–34.0)
MCHC: 32.1 g/dL (ref 30.0–36.0)
MCV: 102.4 fL — ABNORMAL HIGH (ref 80.0–100.0)
Monocytes Absolute: 0.9 10*3/uL (ref 0.1–1.0)
Monocytes Relative: 7 %
Neutro Abs: 9.6 10*3/uL — ABNORMAL HIGH (ref 1.7–7.7)
Neutrophils Relative %: 76 %
Platelet Count: 144 10*3/uL — ABNORMAL LOW (ref 150–400)
RBC: 2.95 MIL/uL — ABNORMAL LOW (ref 3.87–5.11)
RDW: 21 % — ABNORMAL HIGH (ref 11.5–15.5)
WBC Count: 12.7 10*3/uL — ABNORMAL HIGH (ref 4.0–10.5)
nRBC: 0.2 % (ref 0.0–0.2)

## 2022-03-30 LAB — SAMPLE TO BLOOD BANK

## 2022-03-30 LAB — CMP (CANCER CENTER ONLY)
ALT: 14 U/L (ref 0–44)
AST: 16 U/L (ref 15–41)
Albumin: 4.1 g/dL (ref 3.5–5.0)
Alkaline Phosphatase: 93 U/L (ref 38–126)
Anion gap: 14 (ref 5–15)
BUN: 11 mg/dL (ref 8–23)
CO2: 21 mmol/L — ABNORMAL LOW (ref 22–32)
Calcium: 8.6 mg/dL — ABNORMAL LOW (ref 8.9–10.3)
Chloride: 105 mmol/L (ref 98–111)
Creatinine: 0.93 mg/dL (ref 0.44–1.00)
GFR, Estimated: >60 mL/min (ref >60)
Glucose, Bld: 102 mg/dL — ABNORMAL HIGH (ref 70–99)
Potassium: 3.5 mmol/L (ref 3.5–5.1)
Sodium: 140 mmol/L (ref 135–145)
Total Bilirubin: 0.3 mg/dL (ref 0.3–1.2)
Total Protein: 6.7 g/dL (ref 6.5–8.1)

## 2022-03-30 LAB — TOTAL PROTEIN, URINE DIPSTICK: Protein, ur: NEGATIVE mg/dL

## 2022-03-30 LAB — LIPID PANEL
Cholesterol: 136 (ref 0–200)
HDL: 50 (ref 35–70)
LDL Cholesterol: 54
Triglycerides: 196 — AB (ref 40–160)

## 2022-03-30 NOTE — Progress Notes (Signed)
Hematology and Oncology Follow Up Visit  Maria Romero [id number] 1950/07/26 71 y.o. 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 Her CBC is stable to improved at this time. WBC count is 12.7, Hgb 9.7 and platelets 144.  She states that about 5 days after treatment she really felt fatigued and just started feeling better over the last few days.  She still has the itchy raised rash across the forehead, cheeks and behind her ears. She feels that this is a little better.  The ulcers in her mouth have almost completely resolved. She has been using the magic mouth wash.  She notes that her voice is a little raspy at times. This started after her very first treatment cycle and comes and goes.   Her CA 125 a couple weeks ago was down to 280.  She follows up with her surgeon Dr. Berline Lopes on 04/05/2022.  No fever, chills, n/v, cough, dizziness, SOB, chest pain, palpitations, abdominal pain or changes in bowel or bladder habits.  She has not noted any blood loss. No bruising or petechiae.  She has Raynaud's that effects her hands and feet worse now with the cold weather. Per Dr. Marin Olp it is ok for her to restart her amlodipine.  No falls or syncope reported. She ambulates with a walker for added support.  She walks and goes up and down stairs with assistance for exercise when she can.  Appetite and hydration have been good. Weight is stable at 124 lbs.   ECOG Performance Status: 1 - Symptomatic but completely ambulatory  Medications:  Allergies as of 03/30/2022       Reactions   Fluvastatin Sodium Other (See Comments)   Myalgias         Medication List        Accurate as of March 30, 2022 10:39 AM. If you have any questions, ask your nurse or doctor.          acetaminophen 325 MG tablet Commonly known as: TYLENOL Take 325 mg by mouth as needed.   acyclovir 200 MG capsule Commonly known as: ZOVIRAX Take 1 capsule (200 mg total) by mouth 3 (three) times daily.   amoxicillin-clavulanate 875-125 MG tablet Commonly known as: AUGMENTIN Take 1 tablet by mouth 2 (two) times daily.   atorvastatin 40 MG tablet Commonly known as: LIPITOR TAKE ONE TABLET BY MOUTH EVERY NIGHT AT BEDTIME   cholecalciferol 25 MCG (1000 UNIT) tablet Commonly known as: VITAMIN D3 Take 1,000 Units by mouth daily.   dexamethasone 4 MG tablet Commonly known as: DECADRON Take 2 tablets by mouth starting the day after chemotherapy for 3 days. Take with food.   magic mouthwash (nystatin, lidocaine, diphenhydrAMINE, alum & mag hydroxide) suspension Swish and spit 5 mLs 4 (four) times daily as needed for mouth pain.   omeprazole 20 MG capsule Commonly known as: PRILOSEC Take 1 capsule (20 mg total) by mouth daily.   ondansetron 8 MG tablet Commonly known as: Zofran Take 1 tablet (8 mg total) by mouth every 8 (eight) hours as needed for nausea or vomiting. Start on the third day after chemotherapy.   prochlorperazine 10 MG  tablet Commonly known as: COMPAZINE Take 1 tablet (10 mg total) by mouth every 6 (six) hours as needed for nausea or vomiting.   valsartan 160 MG tablet Commonly known as: DIOVAN Take 160 mg by mouth daily.   VIACTIV PO Take by mouth See admin instructions. Chew 1 square by mouth once a day- for calcium        Allergies:  Allergies  Allergen Reactions   Fluvastatin Sodium Other (See Comments)    Myalgias    Past Medical History, Surgical history, Social history, and Family History were reviewed and updated.  Review of Systems: All other 10 point review of systems is negative.   Physical  Exam:  vitals were not taken for this visit.   Wt Readings from Last 3 Encounters:  03/21/22 129 lb (58.5 kg)  03/18/22 126 lb (57.2 kg)  02/24/22 126 lb (57.2 kg)    Ocular: Sclerae unicteric, pupils equal, round and reactive to light Ear-nose-throat: Oropharynx clear, dentition fair Lymphatic: No cervical or supraclavicular adenopathy Lungs no rales or rhonchi, good excursion bilaterally Heart regular rate and rhythm, no murmur appreciated Abd soft, nontender, positive bowel sounds MSK no focal spinal tenderness, no joint edema Neuro: non-focal, well-oriented, appropriate affect Breasts: Deferred   Lab Results  Component Value Date   WBC 12.7 (H) 03/30/2022   HGB 9.7 (L) 03/30/2022   HCT 30.2 (L) 03/30/2022   MCV 102.4 (H) 03/30/2022   PLT 144 (L) 03/30/2022   Lab Results  Component Value Date   FERRITIN 651 (H) 03/18/2022   IRON 61 03/18/2022   TIBC 244 (L) 03/18/2022   UIBC 183 03/18/2022   IRONPCTSAT 25 03/18/2022   Lab Results  Component Value Date   RETICCTPCT 3.0 03/18/2022   RBC 2.95 (L) 03/30/2022   No results found for: "KPAFRELGTCHN", "LAMBDASER", "KAPLAMBRATIO" No results found for: "IGGSERUM", "IGA", "IGMSERUM" No results found for: "TOTALPROTELP", "ALBUMINELP", "A1GS", "A2GS", "BETS", "BETA2SER", "GAMS", "MSPIKE", "SPEI"   Chemistry      Component Value Date/Time   NA 141 03/18/2022 1009   K 3.7 03/18/2022 1009   CL 106 03/18/2022 1009   CO2 24 03/18/2022 1009   BUN 15 03/18/2022 1009   CREATININE 0.88 03/18/2022 1009   CREATININE 0.93 11/03/2021 0000      Component Value Date/Time   CALCIUM 9.5 03/18/2022 1009   ALKPHOS 82 03/18/2022 1009   AST 14 (L) 03/18/2022 1009   ALT 10 03/18/2022 1009   BILITOT 0.3 03/18/2022 1009       Impression and Plan: Maria Romero is a very pleasant 71 yo caucasian female with 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  CA 125 is pending.  She follows up with Dr. Berline Lopes with gyn onc on  04/05/2022.  She has an MD follow-up and resumes treatment here on 04/07/2022.  Labs reviewed with Dr. Marin Olp and no intervention needed at this time.  Lottie Dawson, NP 12/20/202310:39 AM

## 2022-03-30 NOTE — Patient Instructions (Signed)

## 2022-03-30 NOTE — Telephone Encounter (Signed)
MyChart message

## 2022-03-30 NOTE — Progress Notes (Signed)
[place] [facility] CSW Progress Note  Holiday representative met with patient to assess needs.  Discussed Advance Directives.  She said she has completed them and her daughter is aware of her wishes.  Her hope is that the cancer will be gone.  Her granddaughter was also present during the visit.  Provided active listening and supportive counseling.  She said she had no needs at this time.  Jasmine Awe, LCSW

## 2022-03-31 ENCOUNTER — Telehealth: Payer: Self-pay

## 2022-03-31 LAB — CA 125: Cancer Antigen (CA) 125: 167 U/mL — ABNORMAL HIGH (ref 0.0–38.1)

## 2022-03-31 NOTE — Telephone Encounter (Signed)
Called and informed patient of lab results, patient verbalized understanding and denies any questions or concerns at this time.   

## 2022-03-31 NOTE — Telephone Encounter (Signed)
-----   Message from Volanda Napoleon, MD sent at 03/31/2022 12:27 PM EST ----- Call and let her know that the tumor marker is now down to 167.  Everything is moving in the right direction.  Please have a very Merry Christmas!!  Pete

## 2022-04-05 ENCOUNTER — Encounter: Payer: Self-pay | Admitting: Oncology

## 2022-04-05 ENCOUNTER — Telehealth: Payer: Self-pay | Admitting: *Deleted

## 2022-04-05 ENCOUNTER — Inpatient Hospital Stay (HOSPITAL_BASED_OUTPATIENT_CLINIC_OR_DEPARTMENT_OTHER): Payer: Medicare Other | Admitting: Gynecologic Oncology

## 2022-04-05 ENCOUNTER — Encounter: Payer: Self-pay | Admitting: Gynecologic Oncology

## 2022-04-05 ENCOUNTER — Ambulatory Visit: Payer: Medicare Other | Admitting: Gynecologic Oncology

## 2022-04-05 VITALS — BP 136/76 | HR 92 | Temp 97.7°F | Ht 63.19 in | Wt 129.0 lb

## 2022-04-05 DIAGNOSIS — Z5111 Encounter for antineoplastic chemotherapy: Secondary | ICD-10-CM | POA: Diagnosis not present

## 2022-04-05 DIAGNOSIS — R180 Malignant ascites: Secondary | ICD-10-CM | POA: Diagnosis not present

## 2022-04-05 DIAGNOSIS — R978 Other abnormal tumor markers: Secondary | ICD-10-CM

## 2022-04-05 DIAGNOSIS — R21 Rash and other nonspecific skin eruption: Secondary | ICD-10-CM | POA: Diagnosis not present

## 2022-04-05 DIAGNOSIS — Z5189 Encounter for other specified aftercare: Secondary | ICD-10-CM | POA: Diagnosis not present

## 2022-04-05 DIAGNOSIS — R188 Other ascites: Secondary | ICD-10-CM | POA: Diagnosis not present

## 2022-04-05 DIAGNOSIS — C561 Malignant neoplasm of right ovary: Secondary | ICD-10-CM | POA: Diagnosis not present

## 2022-04-05 DIAGNOSIS — Z87891 Personal history of nicotine dependence: Secondary | ICD-10-CM | POA: Diagnosis not present

## 2022-04-05 DIAGNOSIS — Z803 Family history of malignant neoplasm of breast: Secondary | ICD-10-CM | POA: Diagnosis not present

## 2022-04-05 DIAGNOSIS — I7300 Raynaud's syndrome without gangrene: Secondary | ICD-10-CM | POA: Diagnosis not present

## 2022-04-05 DIAGNOSIS — C569 Malignant neoplasm of unspecified ovary: Secondary | ICD-10-CM

## 2022-04-05 DIAGNOSIS — I10 Essential (primary) hypertension: Secondary | ICD-10-CM | POA: Diagnosis not present

## 2022-04-05 NOTE — Patient Instructions (Addendum)
Preparing for your Surgery  Plan for surgery on 04/20/2022 with Dr. Berline Lopes at [facility]. You will be scheduled for a diagnostic laparoscopy (looking into the abdomen with a camera through small incision), open (through larger incision) total hysterectomy (removal of the uterus and cervix), bilateral salpingo-oophorectomy (removal of the ovaries and fallopian tubes), tumor debulking (removing any visible tumor that can safely be removed), possible bowel surgery.   Pre-operative Testing -You will receive a phone call from presurgical testing at [facility] to arrange for a pre-operative appointment and lab work.  -Bring your insurance card, copy of an advanced directive if applicable, medication list  -At that visit, you will be asked to sign a consent for a possible blood transfusion in case a transfusion becomes necessary during surgery.  The need for a blood transfusion is rare but having consent is a necessary part of your care.     -You should not be taking blood thinners or aspirin at least ten days prior to surgery unless instructed by your surgeon.  -Do not take supplements such as fish oil (omega 3), red yeast rice, turmeric before your surgery. You want to avoid medications with aspirin in them including headache powders such as BC or Goody's), Excedrin migraine.  Day Before Surgery at [facility] will be asked to take in a light diet the day before surgery. You will be advised you can have clear liquids up until 3 hours before your surgery.    Eat a light diet the day before surgery.  Examples including soups, broths, toast, yogurt, mashed potatoes.  AVOID GAS PRODUCING FOODS. Things to avoid include carbonated beverages (fizzy beverages, sodas), raw fruits and raw vegetables (uncooked), or beans.   If your bowels are filled with gas, your surgeon will have difficulty visualizing your pelvic organs which increases your surgical risks.  Your role in recovery Your role is  to become active as soon as directed by your doctor, while still giving yourself time to heal.  Rest when you feel tired. You will be asked to do the following in order to speed your recovery:  - Cough and breathe deeply. This helps to clear and expand your lungs and can prevent pneumonia after surgery.  - Doyle. Do mild physical activity. Walking or moving your legs help your circulation and body functions return to normal. Do not try to get up or walk alone the first time after surgery.   -If you develop swelling on one leg or the other, pain in the back of your leg, redness/warmth in one of your legs, please call the office or go to the Emergency Room to have a doppler to rule out a blood clot. For shortness of breath, chest pain-seek care in the Emergency Room as soon as possible. - Actively manage your pain. Managing your pain lets you move in comfort. We will ask you to rate your pain on a scale of zero to 10. It is your responsibility to tell your doctor or nurse where and how much you hurt so your pain can be treated.  Special Considerations -If you are diabetic, you may be placed on insulin after surgery to have closer control over your blood sugars to promote healing and recovery.  This does not mean that you will be discharged on insulin.  If applicable, your oral antidiabetics will be resumed when you are tolerating a solid diet.  -Your final pathology results from surgery should be  available around one week after surgery and the results will be relayed to you when available.  -Dr. Delsa Sale is the surgeon that assists your GYN Oncologist with surgery.  If you end up staying the night, the next day after your surgery you will either see Dr. Berline Lopes, Dr. Ernestina Patches, or Dr. Lahoma Crocker.  -FMLA forms can be faxed to [phone] and please allow 5-7 business days for completion.  Pain Management After Surgery -You have been prescribed your pain medication and  bowel regimen medications before surgery so that you can have these available when you are discharged from the hospital. The pain medication is for use ONLY AFTER surgery and a new prescription will not be given.   -Make sure that you have Tylenol and Ibuprofen IF YOU ARE ABLE TO TAKE THESE MEDICATIONS at home to use on a regular basis after surgery for pain control. We recommend alternating the medications every hour to six hours since they work differently and are processed in the body differently for pain relief.  -Review the attached handout on narcotic use and their risks and side effects.   Bowel Regimen -You have been prescribed Sennakot-S to take nightly to prevent constipation especially if you are taking the narcotic pain medication intermittently.  It is important to prevent constipation and drink adequate amounts of liquids. You can stop taking this medication when you are not taking pain medication and you are back on your normal bowel routine.  Risks of Surgery Risks of surgery are low but include bleeding, infection, damage to surrounding structures, re-operation, blood clots, and very rarely death.   Blood Transfusion Information (For the consent to be signed before surgery)  We will be checking your blood type before surgery so in case of emergencies, we will know what type of blood you would need.                                            WHAT IS A BLOOD TRANSFUSION?  A transfusion is the replacement of blood or some of its parts. Blood is made up of multiple cells which provide different functions. Red blood cells carry oxygen and are used for blood loss replacement. White blood cells fight against infection. Platelets control bleeding. Plasma helps clot blood. Other blood products are available for specialized needs, such as hemophilia or other clotting disorders. BEFORE THE TRANSFUSION  Who gives blood for transfusions?  You may be able to donate blood to be used at a  later date on yourself (autologous donation). Relatives can be asked to donate blood. This is generally not any safer than if you have received blood from a stranger. The same precautions are taken to ensure safety when a relative's blood is donated. Healthy volunteers who are fully evaluated to make sure their blood is safe. This is blood bank blood. Transfusion therapy is the safest it has ever been in the practice of medicine. Before blood is taken from a donor, a complete history is taken to make sure that person has no history of diseases nor engages in risky social behavior (examples are intravenous drug use or sexual activity with multiple partners). The donor's travel history is screened to minimize risk of transmitting infections, such as malaria. The donated blood is tested for signs of infectious diseases, such as HIV and hepatitis. The blood is then tested to be sure it is  compatible with you in order to minimize the chance of a transfusion reaction. If you or a relative donates blood, this is often done in anticipation of surgery and is not appropriate for emergency situations. It takes many days to process the donated blood. RISKS AND COMPLICATIONS Although transfusion therapy is very safe and saves many lives, the main dangers of transfusion include:  Getting an infectious disease. Developing a transfusion reaction. This is an allergic reaction to something in the blood you were given. Every precaution is taken to prevent this. The decision to have a blood transfusion has been considered carefully by your caregiver before blood is given. Blood is not given unless the benefits outweigh the risks.  AFTER SURGERY INSTRUCTIONS  Return to work: 4-6 weeks if applicable  Activity: 1. Be up and out of the bed during the day.  Take a nap if needed.  You may walk up steps but be careful and use the hand rail.  Stair climbing will tire you more than you think, you may need to stop part way and  rest.   2. No lifting or straining for 6 weeks over 10 pounds. No pushing, pulling, straining for 6 weeks.  3. No driving for 5 -10 days until cleared to drive.  Do not drive if you are taking narcotic pain medicine and make sure that your reaction time has returned.   4. You can shower as soon as the next day after surgery. Shower daily.  Use your regular soap and water (not directly on the incision) and pat your incision(s) dry afterwards; don't rub.  No tub baths or submerging your body in water until cleared by your surgeon. If you have the soap that was given to you by pre-surgical testing that was used before surgery, you do not need to use it afterwards because this can irritate your incisions.   5. No sexual activity and nothing in the vagina for 8-10 weeks.  6. You may experience a small amount of clear drainage from your incisions, which is normal.  If the drainage persists, increases, or changes color please call the office.  7. Do not use creams, lotions, or ointments such as neosporin on your incisions after surgery until advised by your surgeon because they can cause removal of the dermabond glue on your incisions.    8. You may experience vaginal spotting after surgery or around the 6-8 week mark from surgery when the stitches at the top of the vagina begin to dissolve.  The spotting is normal but if you experience heavy bleeding, call our office.  9. Take Tylenol or ibuprofen first for pain if you are able to take these medications and only use narcotic pain medication for severe pain not relieved by the Tylenol or Ibuprofen.  Monitor your Tylenol intake to a max of 4,000 mg in a 24 hour period. You can alternate these medications after surgery.  Diet: 1. Low sodium Heart Healthy Diet is recommended but you are cleared to resume your normal (before surgery) diet after your procedure.  2. It is safe to use a laxative, such as Miralax or Colace, if you have difficulty moving your  bowels. You have been prescribed Sennakot-S to take at bedtime every evening after surgery to keep bowel movements regular and to prevent constipation.    Wound Care: 1. Keep clean and dry.  Shower daily.  Reasons to call the Doctor: Fever - Oral temperature greater than 100.4 degrees Fahrenheit Foul-smelling vaginal discharge Difficulty urinating  Nausea and vomiting Increased pain at the site of the incision that is unrelieved with pain medicine. Difficulty breathing with or without chest pain New calf pain especially if only on one side Sudden, continuing increased vaginal bleeding with or without clots.   Contacts: For questions or concerns you should contact:  Dr. Jeral Pinch at [phone]  Joylene John, NP at [phone]  After Hours: call [phone] and have the GYN Oncologist paged/contacted (after 5 pm or on the weekends).  Messages sent via mychart are for non-urgent matters and are not responded to after hours so for urgent needs, please call the after hours number.  [facility] BOWEL PREP     FIVE DAYS PRIOR TO YOUR SURGERY   Obtain supplies for the bowel prep at a pharmacy of your choice: Office sent prescription for your antibiotic pills (Neomycin and Erythromycin)              A bottle of Miralax (238 g)- prescription sent A large bottle of Gatorade/Powerade (64 oz), avoid red/purple coloring- no prescription required Dulcolax tablets (4 tablets)- prescription sent     Change your diet to make the bowel prep go more easily: Switch to a bland, low fiber diet Stop eating any nuts, popcorn, or fruit with seeds.  Stop all fiber supplements such as Metamucil, Miralax, etc.     Improve nutrition: Consider drinking 2-3 nutritional shakes (Ex: Ensure Surgery) every day, starting 5 days prior to surgery     DAY PRIOR TO SURGERY    DO NOT TAKE LIPITOR THE DAY BEFORE SURGERY SINCE THIS CAN INTERACT WITH THE PREOPERATIVE ANTIBIOTICS   Switch to a full liquid  diet the day before surgery Drink plenty of liquids all day to avoid getting dehydrated      7:00 am Swallow 4 dulcolax tablets with some water   10:00 am Mix the bottle of Miralax with the 64 ounces of Gatorade Drink the Gatorade/Miralax mixture gradually (8 oz glass every 15 minutes) until gone. (You should finish in 4 hours)   2:00pm Take 2 Neomycin '500mg'$  tablets & 2 Erythromycin '500mg'$  tablets   3:00pm Take 2 Neomycin '500mg'$  tablets & 2 Erythromycin '500mg'$  tablets  Drink plenty of clear liquids all evening to avoid getting dehydrated   10:00pm Take 2 Neomycin '500mg'$  tablets & 2 Erythromycin '500mg'$  tablets  Drink 2 Carbohydrate loading nutrition drinks (ex: Ensure Presurgery). These will be given at pre-op appointment. Do not eat anything solid after bedtime (midnight) the night before your surgery.   BUT DO drink plenty of clear liquids (Water, Gatorade, juice, soda, coffee, tea, broths, etc.) up to 3 hours prior to surgery to avoid getting dehydrated.      MORNING OF SURGERY   Remember to not to eat anything solid that morning  Drink one final carbohydrate loading nutritional drink (ex: Ensure Presurgery) upon waking up in the morning (needs to be 3 hours before your surgery). Hold or take medications as recommended by the hospital staff at your Preoperative visit Stop drinking liquids before you leave the house (>3 hours prior to surgery)       If you have questions or concerns, please call GYN Oncology Office at [phone] during business hours to speak to the clinical staff for advice.       WHAT DO I NEED TO KNOW ABOUT A FULL LIQUID DIET? -You may have any liquid. -You may have any food that becomes a liquid at room temperature. The food is considered a liquid if it  can be poured off a spoon at room temperature. WHAT FOODS CAN I EAT? -Grains: Any grain food that can be pureed in soup (such as crackers, pasta, and rice). Hot cereal (such as farina or oatmeal) that  has been blended.  -Fruits: Fruit juice, including nectars and juices. -Meats and Other Protein Sources: Eggs in custard, eggnog mix, and eggs used in ice cream or pudding. Strained meats, like in baby food, may be allowed. Consult your health care provider.  -Dairy: Milk and milk-based beverages, including milk shakes and instant breakfast mixes. Smooth yogurt. Pureed cottage cheese. Avoid these foods if they are not well tolerated. -Beverages: All beverages, including liquid nutritional supplements. AVOID CARBONATED BEVERAGES. -Condiments: Iodized salt, pepper, spices, and flavorings. Cocoa powder. Vinegar, ketchup, yellow mustard, smooth sauces (such as hollandaise, cheese sauce, or white sauce), and soy sauce. -Sweets and Desserts: Custard, smooth pudding. Flavored gelatin. Tapioca, junket. Plain ice cream, sherbet, fruit ices. Frozen ice pops, frozen fudge pops, pudding pops, and other frozen bars with cream. Syrups, including chocolate syrup. Sugar, honey, jelly.  -Fats and Oils: Margarine, butter, cream, sour cream, and oils. -Other: Broth and cream soups. Strained, broth-based soups. The items listed above may not be a complete list of recommended foods or beverages.  WHAT FOODS CAN I NOT EAT? Grains: All breads. Grains are not allowed unless they are pureed into soup. Vegetables: No raw vegetables. Vegetables are not allowed unless they are juiced, or cooked and pureed into soup. Fruits: Fruits are not allowed unless they are juiced. Meats and Other Protein Sources: Any meat or fish. Cooked or raw eggs. Nut butters.  Dairy: Cheese.  Condiments: Stone ground mustards. Fats and Oils: Fats that are coarse or chunky. Sweets and Desserts: Ice cream or other frozen desserts that have any solids in them or on top, such as nuts, chocolate chips, and pieces of cookies. Cakes. Cookies. Candy. Others: Soups with chunks or pieces in them.

## 2022-04-05 NOTE — Telephone Encounter (Signed)
Per Dr Berline Lopes, fax surgical optimization form to the patient's PCP

## 2022-04-05 NOTE — H&P (View-Only) (Signed)
Gynecologic Oncology Return Clinic Visit  04/05/22  Reason for Visit: follow-up, treatment discussion  Treatment History: Oncology History  [name], right (HCC)  12/01/2021 Initial Diagnosis   [name], right (HCC)   12/01/2021 Cancer Staging   Staging form: Ovary, Fallopian Tube, and Primary Peritoneal Carcinoma, AJCC 8th Edition - Clinical stage from 12/01/2021: FIGO Stage IIIC (cT3c, cN1b, cM0) - Signed by Ennever, Peter R, MD on 12/01/2021 Stage prefix: Initial diagnosis Histologic grade (G): G2 Histologic grading system: 4 grade system   12/07/2021 -  Chemotherapy   Patient is on Treatment Plan : OVARIAN Carboplatin (AUC 6) + Paclitaxel (175) q21d X 6 Cycles     01/11/2022 -  Chemotherapy   Patient is on Treatment Plan : OVARIAN Bevacizumab q21d      Genetic Testing   Negative genetic testing. No pathogenic variants identified on the [facility] Common Hereditary Cancers+RNA panel. The report date is 01/24/2022.  The Common Hereditary Cancers Panel + RNA offered by [facility] includes sequencing and/or deletion duplication testing of the following 47 genes: APC, ATM, AXIN2, BARD1, BMPR1A, BRCA1, BRCA2, BRIP1, CDH1, CDKN2A (p14ARF), CDKN2A (p16INK4a), CKD4, CHEK2, CTNNA1, DICER1, EPCAM (Deletion/duplication testing only), GREM1 (promoter region deletion/duplication testing only), KIT, MEN1, MLH1, MSH2, MSH3, MSH6, MUTYH, NBN, NF1, NHTL1, PALB2, PDGFRA, PMS2, POLD1, POLE, PTEN, RAD50, RAD51C, RAD51D, SDHB, SDHC, SDHD, SMAD4, SMARCA4. STK11, TP53, TSC1, TSC2, and VHL.  The following genes were evaluated for sequence changes only: SDHA and HOXB13 c.251G>A variant only.    NACT: C1: carboplatin/taxol C2-4: Carboplatin/avastin C5: carbo/taxotere C5 was 12/8 C6 scheduled for 12/28  Interval History: After C1, developed significant diarrhea and dehydaration. She was hospitalized for acute respiratory failure with hypoxia (in the setting of bilateral pleural effusions, underwent left  thoracentesis x2; also underwent 4 paracenteses).   Reports has had continued difficulty with chemotherapy.  Overall, she is now feeling much better.  Energy has been up and down.  Appetite continues to improve.  She had some spotting for about a week previously, denies any more recent bleeding.  Continues to have loose bowel movement daily.  Denies any urinary symptoms.  After last infusion, developed bumps along her forehead as well as mouth sores.  Took amoxicillin with ultimate improvement in the symptoms.  Past Medical/Surgical History: Past Medical History:  Diagnosis Date   Goals of care, counseling/discussion 12/01/2021   Hypercholesterolemia    Hypertension    IBS (irritable bowel syndrome)    Malignant ascites 11/19/2021   Osteopenia    Osteoporosis    [name], right (HCC) 12/01/2021   Ovarian mass, right 11/19/2021    Past Surgical History:  Procedure Laterality Date   IR IMAGING GUIDED PORT INSERTION  11/26/2021   IR PARACENTESIS  11/23/2021   IR PARACENTESIS  11/26/2021   lump removal  04/11/1993   axilla right - benign   TONSILLECTOMY     TUBAL LIGATION  04/11/1988    Family History  Problem Relation Age of Onset   Stroke Mother    Hypertension Mother    Hyperlipidemia Mother    Glaucoma Mother    Heart attack Father 56   Diabetes Father    Hypertension Father    Hyperlipidemia Father    Cataracts Father    Breast cancer Cousin    Colon cancer Neg Hx    Ovarian cancer Neg Hx    Endometrial cancer Neg Hx    Pancreatic cancer Neg Hx    Prostate cancer Neg Hx     Social   History   Socioeconomic History   Marital status: Widowed    Spouse name: Not on file   Number of children: 1   Years of education: 14   Highest education level: Associate degree: occupational, technical, or vocational program  Occupational History   Occupation: retired lab tech  Tobacco Use   Smoking status: Former    Types: Cigarettes    Quit date: 03/20/1990    Years since  quitting: 32.0   Smokeless tobacco: Never   Tobacco comments:    quit 1988  Vaping Use   Vaping Use: Never used  Substance and Sexual Activity   Alcohol use: Not Currently    Alcohol/week: 2.0 standard drinks of alcohol    Types: 2 Glasses of wine per week    Comment: Wine in the evenings   Drug use: No   Sexual activity: Not Currently    Partners: Male  Other Topics Concern   Not on file  Social History Narrative   Lives alone with her dog. Helps with her grandson pick up and volunteering at his school. She enjoys spending time with her family.   Social Determinants of Health   Financial Resource Strain: Low Risk  (03/26/2021)   Overall Financial Resource Strain (CARDIA)    Difficulty of Paying Living Expenses: Not hard at all  Food Insecurity: No Food Insecurity (12/15/2021)   Hunger Vital Sign    Worried About Running Out of Food in the Last Year: Never true    Ran Out of Food in the Last Year: Never true  Transportation Needs: No Transportation Needs (12/15/2021)   PRAPARE - Transportation    Lack of Transportation (Medical): No    Lack of Transportation (Non-Medical): No  Physical Activity: Inactive (03/26/2021)   Exercise Vital Sign    Days of Exercise per Week: 0 days    Minutes of Exercise per Session: 0 min  Stress: No Stress Concern Present (03/26/2021)   [employer] - Occupational Stress Questionnaire    Feeling of Stress : Not at all  Social Connections: Socially Isolated (03/26/2021)   Social Connection and Isolation Panel [NHANES]    Frequency of Communication with Friends and Family: More than three times a week    Frequency of Social Gatherings with Friends and Family: Three times a week    Attends Religious Services: Never    Active Member of Clubs or Organizations: No    Attends Club or Organization Meetings: Never    Marital Status: Widowed    Current Medications:  Current Outpatient Medications:    acetaminophen  (TYLENOL) 325 MG tablet, Take 325 mg by mouth as needed., Disp: , Rfl:    acyclovir (ZOVIRAX) 200 MG capsule, Take 1 capsule (200 mg total) by mouth 3 (three) times daily., Disp: 60 capsule, Rfl: 1   amoxicillin-clavulanate (AUGMENTIN) 875-125 MG tablet, Take 1 tablet by mouth 2 (two) times daily., Disp: 20 tablet, Rfl: 2   atorvastatin (LIPITOR) 40 MG tablet, TAKE ONE TABLET BY MOUTH EVERY NIGHT AT BEDTIME, Disp: 90 tablet, Rfl: 3   Calcium-Vitamin D-Vitamin K (VIACTIV PO), Take by mouth See admin instructions. Chew 1 square by mouth once a day- for calcium, Disp: , Rfl:    cholecalciferol (VITAMIN D3) 25 MCG (1000 UNIT) tablet, Take 1,000 Units by mouth daily., Disp: , Rfl:    dexamethasone (DECADRON) 4 MG tablet, Take 2 tablets by mouth starting the day after chemotherapy for 3 days. Take with food., Disp: 30 tablet, Rfl: 1     magic mouthwash (nystatin, lidocaine, diphenhydrAMINE, alum & mag hydroxide) suspension, Swish and spit 5 mLs 4 (four) times daily as needed for mouth pain., Disp: 240 mL, Rfl: 1   omeprazole (PRILOSEC) 20 MG capsule, Take 1 capsule (20 mg total) by mouth daily., Disp: 90 capsule, Rfl: 2   ondansetron (ZOFRAN) 8 MG tablet, Take 1 tablet (8 mg total) by mouth every 8 (eight) hours as needed for nausea or vomiting. Start on the third day after chemotherapy., Disp: 30 tablet, Rfl: 1   prochlorperazine (COMPAZINE) 10 MG tablet, Take 1 tablet (10 mg total) by mouth every 6 (six) hours as needed for nausea or vomiting., Disp: 30 tablet, Rfl: 1   valsartan (DIOVAN) 160 MG tablet, Take 160 mg by mouth daily., Disp: , Rfl:   Review of Systems: + Mouth sores, voice changes, shortness of breath, constipation, diarrhea, rash, numbness. Denies appetite changes, fevers, chills, fatigue, unexplained weight changes. Denies hearing loss, neck lumps or masses, ringing in ears. Denies cough or wheezing.  Denies chest pain or palpitations. Denies leg swelling. Denies abdominal distention,  pain, blood in stools, nausea, vomiting, or early satiety. Denies pain with intercourse, dysuria, frequency, hematuria or incontinence. Denies hot flashes, pelvic pain, vaginal bleeding or vaginal discharge.   Denies joint pain, back pain or muscle pain/cramps. Denies itching or wounds. Denies dizziness, headaches or seizures. Denies swollen lymph nodes or glands, denies easy bruising or bleeding. Denies anxiety, depression, confusion, or decreased concentration.  Physical Exam: BP 136/76 (BP Location: Left Arm, Patient Position: Sitting)   Pulse 92   Temp 97.7 F (36.5 C) (Oral)   Ht 5' 3.19" (1.605 m)   Wt 129 lb (58.5 kg)   SpO2 98%   BMI 22.71 kg/m  General: Alert, oriented, no acute distress. HEENT: Normocephalic, atraumatic, sclera anicteric. Chest: Clear to auscultation bilaterally.  No wheezing or rhonchi. Cardiovascular: Regular rate and rhythm, no murmurs. Abdomen: soft, nontender, some fullness in lower mid abdomen.  Normoactive bowel sounds.  No hepatosplenomegaly appreciated.   Extremities: Grossly normal range of motion.  Warm, well perfused.  No edema bilaterally. Skin: No rashes or lesions noted. Lymphatics: No cervical, supraclavicular, or inguinal adenopathy. GU: Normal appearing external genitalia without erythema, excoriation, or lesions. On bimanual exam, somewhat bulbous LUS, small uterus, minimally mobile with adnexal masses. Adnexal masses are in close proixmity to rectum. Not definitely adherent but given limited mobility, difficult to assess.  Laboratory & Radiologic Studies: CT C/A/P on 03/08/22: 1. Reduction in the omental caking of tumor compared to 11/15/2021, although substantial tumor remains. 2. The right adnexal mass has a volume of 220 cubic cm and appears to be primarily solid, although components may be hemorrhagic. Previous ascites has convinced into a loculated collection of fluid surrounding this adnexal mass and measuring 1400 cc. 3.  Questionable bowel wall thickening in the transverse colon, although much of the appearance may be due to nondistention. Correlate with any signs of colitis. 4. Previous right pleural effusion has resolved. Residual small left pleural effusion is nonspecific for transudative or exudative etiology. 5. Other imaging findings of potential clinical significance: Substantial left anterior descending coronary artery atherosclerosis. Grade 1 degenerative anterolisthesis L4-5. Degenerative facet arthropathy at L4-5 and L5-S1. Old healed right posterior rib fractures. Small calcifications along the pleural margin of the left hemidiaphragm, possibly from prior asbestos exposure or prior pleurodesis. 6. Aortic atherosclerosis.            Component Ref Range & Units 6 d ago (03/30/22) 2 wk   ago (03/18/22) 1 mo ago (02/24/22) 2 mo ago (02/01/22) 2 mo ago (01/21/22) 2 mo ago (01/11/22) 3 mo ago (12/28/21)  Cancer Antigen (CA) 125 0.0 - 38.1 U/mL 167.0 High  280.0 High  CM 369.0 High  CM 550.0 High  CM 546.0 High  CM 760.0 High  CM 1,313.0 High  CM      Assessment & Plan: Brette S Milhouse is a 71 y.o. 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  Patient presents today for discussion of interval debulking surgery.  She has now completed 5 cycles of neoadjuvant chemotherapy.  She continues to have decrease in her Ca1 25 with improvement in abdominal disease burden.  We reviewed images from her most recent CT scan today including omental tumor, pelvic findings, loculated ascites.  She is struggled with chemotherapy, could not get Taxol for multiple cycles.  Had a Avastin added for several cycles given large volume ascites.  Discussed that given radiographic evidence of response in combination with decreasing tumor marker, I would recommend attempt at debulking surgery.  From a timing standpoint, we have held time in the OR on January 10.  We discussed plan for diagnostic laparoscopy to  assess feasibility of debulking surgery.  If findings at the time of laparoscopy suggest debulking is possible, then we will proceed with exploratory laparotomy, total abdominal hysterectomy and bilateral salpingo-oophorectomy, tumor debulking including omentectomy, and any other indicated procedures.  While on imaging, there appears to be a plane between the adnexal masses and colon, I am able to definitively palpate on rectal exam that the colon is free from these masses.  I recommended that we proceed with preoperative bowel prep in the event that the patient needs colon resection.  After discussion, the patient would like to proceed with scheduling surgery.  She understands the risk that debulking surgery may not be feasible or recommended depending on intra op findings.  She also understands that optimal debulking may require bowel excision.  We discussed plan for diagnostic laparoscopy, possible open total hysterectomy, bilateral salpingo-oophorectomy, tumor debulking including omentectomy, possible bowel resection, possible diverting ostomy, and any other indicated procedures. The risks of surgery were discussed in detail and she understands these to include infection; wound separation; hernia; vaginal cuff separation, injury to adjacent organs such as bowel, bladder, blood vessels, ureters and nerves; bleeding which may require blood transfusion; anesthesia risk; thromboembolic events; possible death; unforeseen complications; possible need for re-exploration; medical complications such as heart attack, stroke, pleural effusion and pneumonia; and, if full lymphadenectomy is performed the risk of lymphedema and lymphocyst. The patient will receive DVT and antibiotic prophylaxis as indicated. She voiced a clear understanding. She had the opportunity to ask questions. Perioperative instructions were reviewed with her.  The patient will be called later with bowel prep instructions and medications for postop  recovery will be sent to her pharmacy at that time.  Discussed postoperative hospital stay, likely will be between 2-10 days, depending on extent of surgery and need for bowel surgery.  32 minutes of total time was spent for this patient encounter, including preparation, face-to-face counseling with the patient and coordination of care, and documentation of the encounter.  Briann Sarchet, MD  Division of Gynecologic Oncology  Department of Obstetrics and Gynecology  [facility]   

## 2022-04-05 NOTE — Progress Notes (Signed)
Gynecologic Oncology Return Clinic Visit  04/05/22  Reason for Visit: follow-up, treatment discussion  Treatment History: Oncology History  [name], right ([place])  12/01/2021 Initial Diagnosis   [name], right ([place])   12/01/2021 Cancer Staging   Staging form: Ovary, Fallopian Tube, and Primary Peritoneal Carcinoma, AJCC 8th Edition - Clinical stage from 12/01/2021: FIGO Stage IIIC (cT3c, cN1b, cM0) - Signed by Volanda Napoleon, MD on 12/01/2021 Stage prefix: Initial diagnosis Histologic grade (G): G2 Histologic grading system: 4 grade system   12/07/2021 -  Chemotherapy   Patient is on Treatment Plan : OVARIAN Carboplatin (AUC 6) + Paclitaxel (175) q21d X 6 Cycles     01/11/2022 -  Chemotherapy   Patient is on Treatment Plan : OVARIAN Bevacizumab q21d      Genetic Testing   Negative genetic testing. No pathogenic variants identified on the [facility] Common Hereditary Cancers+RNA panel. The report date is 01/24/2022.  The Common Hereditary Cancers Panel + RNA offered by [facility] includes sequencing and/or deletion duplication testing of the following 47 genes: APC, ATM, AXIN2, BARD1, BMPR1A, BRCA1, BRCA2, BRIP1, CDH1, CDKN2A (p14ARF), CDKN2A (p16INK4a), CKD4, CHEK2, CTNNA1, DICER1, EPCAM (Deletion/duplication testing only), GREM1 (promoter region deletion/duplication testing only), KIT, MEN1, MLH1, MSH2, MSH3, MSH6, MUTYH, NBN, NF1, NHTL1, PALB2, PDGFRA, PMS2, POLD1, POLE, PTEN, RAD50, RAD51C, RAD51D, SDHB, SDHC, SDHD, SMAD4, SMARCA4. STK11, TP53, TSC1, TSC2, and VHL.  The following genes were evaluated for sequence changes only: SDHA and HOXB13 c.251G>A variant only.    NACT: C1: carboplatin/taxol C2-4: Carboplatin/avastin C5: carbo/taxotere C5 was 12/8 C6 scheduled for 12/28  Interval History: After C1, developed significant diarrhea and dehydaration. She was hospitalized for acute respiratory failure with hypoxia (in the setting of bilateral pleural effusions, underwent left  thoracentesis x2; also underwent 4 paracenteses).   Reports has had continued difficulty with chemotherapy.  Overall, she is now feeling much better.  Energy has been up and down.  Appetite continues to improve.  She had some spotting for about a week previously, denies any more recent bleeding.  Continues to have loose bowel movement daily.  Denies any urinary symptoms.  After last infusion, developed bumps along her forehead as well as mouth sores.  Took amoxicillin with ultimate improvement in the symptoms.  Past Medical/Surgical History: Past Medical History:  Diagnosis Date   Goals of care, counseling/discussion 12/01/2021   Hypercholesterolemia    Hypertension    IBS (irritable bowel syndrome)    Malignant ascites 11/19/2021   Osteopenia    Osteoporosis    [name], right ([employer]) 12/01/2021   Ovarian mass, right 11/19/2021    Past Surgical History:  Procedure Laterality Date   IR IMAGING GUIDED PORT INSERTION  11/26/2021   IR PARACENTESIS  11/23/2021   IR PARACENTESIS  11/26/2021   lump removal  04/11/1993   axilla right - benign   TONSILLECTOMY     TUBAL LIGATION  04/11/1988    Family History  Problem Relation Age of Onset   Stroke Mother    Hypertension Mother    Hyperlipidemia Mother    Glaucoma Mother    Heart attack Father 1   Diabetes Father    Hypertension Father    Hyperlipidemia Father    Cataracts Father    Breast cancer Cousin    Colon cancer Neg Hx    Ovarian cancer Neg Hx    Endometrial cancer Neg Hx    Pancreatic cancer Neg Hx    Prostate cancer Neg Hx     Social  History   Socioeconomic History   Marital status: Widowed    Spouse name: Not on file   Number of children: 1   Years of education: 14   Highest education level: Associate degree: occupational, Hotel manager, or vocational program  Occupational History   Occupation: retired Quarry manager  Tobacco Use   Smoking status: Former    Types: Cigarettes    Quit date: 03/20/1990    Years since  quitting: 32.0   Smokeless tobacco: Never   Tobacco comments:    quit 1988  Vaping Use   Vaping Use: Never used  Substance and Sexual Activity   Alcohol use: Not Currently    Alcohol/week: 2.0 standard drinks of alcohol    Types: 2 Glasses of wine per week    Comment: Wine in the evenings   Drug use: No   Sexual activity: Not Currently    Partners: Male  Other Topics Concern   Not on file  Social History Narrative   Lives alone with her dog. Helps with her grandson pick up and volunteering at his school. She enjoys spending time with her family.   Social Determinants of Health   Financial Resource Strain: Low Risk  (03/26/2021)   Overall Financial Resource Strain (CARDIA)    Difficulty of Paying Living Expenses: Not hard at all  Food Insecurity: No Food Insecurity (12/15/2021)   Hunger Vital Sign    Worried About Running Out of Food in the Last Year: Never true    Ran Out of Food in the Last Year: Never true  Transportation Needs: No Transportation Needs (12/15/2021)   PRAPARE - Hydrologist (Medical): No    Lack of Transportation (Non-Medical): No  Physical Activity: Inactive (03/26/2021)   Exercise Vital Sign    Days of Exercise per Week: 0 days    Minutes of Exercise per Session: 0 min  Stress: No Stress Concern Present (03/26/2021)   [place]    Feeling of Stress : Not at all  Social Connections: Socially Isolated (03/26/2021)   Social Connection and Isolation Panel [NHANES]    Frequency of Communication with Friends and Family: More than three times a week    Frequency of Social Gatherings with Friends and Family: Three times a week    Attends Religious Services: Never    Active Member of Clubs or Organizations: No    Attends Archivist Meetings: Never    Marital Status: Widowed    Current Medications:  Current Outpatient Medications:    acetaminophen  (TYLENOL) 325 MG tablet, Take 325 mg by mouth as needed., Disp: , Rfl:    acyclovir (ZOVIRAX) 200 MG capsule, Take 1 capsule (200 mg total) by mouth 3 (three) times daily., Disp: 60 capsule, Rfl: 1   amoxicillin-clavulanate (AUGMENTIN) 875-125 MG tablet, Take 1 tablet by mouth 2 (two) times daily., Disp: 20 tablet, Rfl: 2   atorvastatin (LIPITOR) 40 MG tablet, TAKE ONE TABLET BY MOUTH EVERY NIGHT AT BEDTIME, Disp: 90 tablet, Rfl: 3   Calcium-Vitamin D-Vitamin K (VIACTIV PO), Take by mouth See admin instructions. Chew 1 square by mouth once a day- for calcium, Disp: , Rfl:    cholecalciferol (VITAMIN D3) 25 MCG (1000 UNIT) tablet, Take 1,000 Units by mouth daily., Disp: , Rfl:    dexamethasone (DECADRON) 4 MG tablet, Take 2 tablets by mouth starting the day after chemotherapy for 3 days. Take with food., Disp: 30 tablet, Rfl: 1  magic mouthwash (nystatin, lidocaine, diphenhydrAMINE, alum & mag hydroxide) suspension, Swish and spit 5 mLs 4 (four) times daily as needed for mouth pain., Disp: 240 mL, Rfl: 1   omeprazole (PRILOSEC) 20 MG capsule, Take 1 capsule (20 mg total) by mouth daily., Disp: 90 capsule, Rfl: 2   ondansetron (ZOFRAN) 8 MG tablet, Take 1 tablet (8 mg total) by mouth every 8 (eight) hours as needed for nausea or vomiting. Start on the third day after chemotherapy., Disp: 30 tablet, Rfl: 1   prochlorperazine (COMPAZINE) 10 MG tablet, Take 1 tablet (10 mg total) by mouth every 6 (six) hours as needed for nausea or vomiting., Disp: 30 tablet, Rfl: 1   valsartan (DIOVAN) 160 MG tablet, Take 160 mg by mouth daily., Disp: , Rfl:   Review of Systems: + Mouth sores, voice changes, shortness of breath, constipation, diarrhea, rash, numbness. Denies appetite changes, fevers, chills, fatigue, unexplained weight changes. Denies hearing loss, neck lumps or masses, ringing in ears. Denies cough or wheezing.  Denies chest pain or palpitations. Denies leg swelling. Denies abdominal distention,  pain, blood in stools, nausea, vomiting, or early satiety. Denies pain with intercourse, dysuria, frequency, hematuria or incontinence. Denies hot flashes, pelvic pain, vaginal bleeding or vaginal discharge.   Denies joint pain, back pain or muscle pain/cramps. Denies itching or wounds. Denies dizziness, headaches or seizures. Denies swollen lymph nodes or glands, denies easy bruising or bleeding. Denies anxiety, depression, confusion, or decreased concentration.  Physical Exam: BP 136/76 (BP Location: Left Arm, Patient Position: Sitting)   Pulse 92   Temp 97.7 F (36.5 C) (Oral)   Ht 5' 3.19" (1.605 m)   Wt 129 lb (58.5 kg)   SpO2 98%   BMI 22.71 kg/m  General: Alert, oriented, no acute distress. HEENT: Normocephalic, atraumatic, sclera anicteric. Chest: Clear to auscultation bilaterally.  No wheezing or rhonchi. Cardiovascular: Regular rate and rhythm, no murmurs. Abdomen: soft, nontender, some fullness in lower mid abdomen.  Normoactive bowel sounds.  No hepatosplenomegaly appreciated.   Extremities: Grossly normal range of motion.  Warm, well perfused.  No edema bilaterally. Skin: No rashes or lesions noted. Lymphatics: No cervical, supraclavicular, or inguinal adenopathy. GU: Normal appearing external genitalia without erythema, excoriation, or lesions. On bimanual exam, somewhat bulbous LUS, small uterus, minimally mobile with adnexal masses. Adnexal masses are in close proixmity to rectum. Not definitely adherent but given limited mobility, difficult to assess.  Laboratory & Radiologic Studies: CT C/A/P on 03/08/22: 1. Reduction in the omental caking of tumor compared to 11/15/2021, although substantial tumor remains. 2. The right adnexal mass has a volume of 220 cubic cm and appears to be primarily solid, although components may be hemorrhagic. Previous ascites has convinced into a loculated collection of fluid surrounding this adnexal mass and measuring 1400 cc. 3.  Questionable bowel wall thickening in the transverse colon, although much of the appearance may be due to nondistention. Correlate with any signs of colitis. 4. Previous right pleural effusion has resolved. Residual small left pleural effusion is nonspecific for transudative or exudative etiology. 5. Other imaging findings of potential clinical significance: Substantial left anterior descending coronary artery atherosclerosis. Grade 1 degenerative anterolisthesis L4-5. Degenerative facet arthropathy at L4-5 and L5-S1. Old healed right posterior rib fractures. Small calcifications along the pleural margin of the left hemidiaphragm, possibly from prior asbestos exposure or prior pleurodesis. 6. Aortic atherosclerosis.            Component Ref Range & Units 6 d ago (03/30/22) 2 wk  ago (03/18/22) 1 mo ago (02/24/22) 2 mo ago (02/01/22) 2 mo ago (01/21/22) 2 mo ago (01/11/22) 3 mo ago (12/28/21)  Cancer Antigen (CA) 125 0.0 - 38.1 U/mL 167.0 High  280.0 High  CM 369.0 High  CM 550.0 High  CM 546.0 High  CM 760.0 High  CM 1,313.0 High  CM      Assessment & Plan: Maria Romero is a 71 y.o. 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  Patient presents today for discussion of interval debulking surgery.  She has now completed 5 cycles of neoadjuvant chemotherapy.  She continues to have decrease in her Ca1 25 with improvement in abdominal disease burden.  We reviewed images from her most recent CT scan today including omental tumor, pelvic findings, loculated ascites.  She is struggled with chemotherapy, could not get Taxol for multiple cycles.  Had a Avastin added for several cycles given large volume ascites.  Discussed that given radiographic evidence of response in combination with decreasing tumor marker, I would recommend attempt at debulking surgery.  From a timing standpoint, we have held time in the OR on January 10.  We discussed plan for diagnostic laparoscopy to  assess feasibility of debulking surgery.  If findings at the time of laparoscopy suggest debulking is possible, then we will proceed with exploratory laparotomy, total abdominal hysterectomy and bilateral salpingo-oophorectomy, tumor debulking including omentectomy, and any other indicated procedures.  While on imaging, there appears to be a plane between the adnexal masses and colon, I am able to definitively palpate on rectal exam that the colon is free from these masses.  I recommended that we proceed with preoperative bowel prep in the event that the patient needs colon resection.  After discussion, the patient would like to proceed with scheduling surgery.  She understands the risk that debulking surgery may not be feasible or recommended depending on intra op findings.  She also understands that optimal debulking may require bowel excision.  We discussed plan for diagnostic laparoscopy, possible open total hysterectomy, bilateral salpingo-oophorectomy, tumor debulking including omentectomy, possible bowel resection, possible diverting ostomy, and any other indicated procedures. The risks of surgery were discussed in detail and she understands these to include infection; wound separation; hernia; vaginal cuff separation, injury to adjacent organs such as bowel, bladder, blood vessels, ureters and nerves; bleeding which may require blood transfusion; anesthesia risk; thromboembolic events; possible death; unforeseen complications; possible need for re-exploration; medical complications such as heart attack, stroke, pleural effusion and pneumonia; and, if full lymphadenectomy is performed the risk of lymphedema and lymphocyst. The patient will receive DVT and antibiotic prophylaxis as indicated. She voiced a clear understanding. She had the opportunity to ask questions. Perioperative instructions were reviewed with her.  The patient will be called later with bowel prep instructions and medications for postop  recovery will be sent to her pharmacy at that time.  Discussed postoperative hospital stay, likely will be between 2-10 days, depending on extent of surgery and need for bowel surgery.  32 minutes of total time was spent for this patient encounter, including preparation, face-to-face counseling with the patient and coordination of care, and documentation of the encounter.  Jeral Pinch, MD  Division of Gynecologic Oncology  Department of Obstetrics and Gynecology  [facility]

## 2022-04-05 NOTE — Progress Notes (Signed)
Met with Maria Romero and her daughter and went through the preop instructions for the planned surgery on 04/20/22.  Also provided her with my contact information and encouraged her to call with any questions.

## 2022-04-06 LAB — LP
Cholesterol, Total: 136 mg/dL (ref 100–199)
HDL: 50 mg/dL (ref >39)
LDL Chol Calc (NIH): 54 mg/dL (ref 0–99)
Triglycerides: 196 mg/dL — ABNORMAL HIGH (ref 0–149)
VLDL Cholesterol Cal: 32 mg/dL (ref 5–40)

## 2022-04-06 LAB — SPECIMEN STATUS REPORT

## 2022-04-06 NOTE — Progress Notes (Signed)
Triglycerides are up a bit discontinue work on [employer] and staying active.  LDL looks great.

## 2022-04-07 ENCOUNTER — Inpatient Hospital Stay: Payer: Medicare Other

## 2022-04-07 ENCOUNTER — Inpatient Hospital Stay: Payer: Medicare Other | Admitting: Hematology & Oncology

## 2022-04-07 ENCOUNTER — Other Ambulatory Visit: Payer: Medicare Other

## 2022-04-08 ENCOUNTER — Other Ambulatory Visit: Payer: Self-pay | Admitting: Gynecologic Oncology

## 2022-04-08 ENCOUNTER — Telehealth: Payer: Self-pay | Admitting: Oncology

## 2022-04-08 ENCOUNTER — Encounter: Payer: Self-pay | Admitting: Gynecologic Oncology

## 2022-04-08 DIAGNOSIS — C569 Malignant neoplasm of unspecified ovary: Secondary | ICD-10-CM

## 2022-04-08 MED ORDER — METRONIDAZOLE 500 MG PO TABS: ORAL_TABLET | ORAL | 0 refills | Status: DC

## 2022-04-08 MED ORDER — TRAMADOL HCL 50 MG PO TABS: 50.0000 mg | ORAL_TABLET | Freq: Four times a day (QID) | ORAL | 0 refills | Status: DC | PRN

## 2022-04-08 MED ORDER — SENNOSIDES-DOCUSATE SODIUM 8.6-50 MG PO TABS: 2.0000 | ORAL_TABLET | Freq: Every day | ORAL | 0 refills | Status: DC

## 2022-04-08 MED ORDER — BISACODYL 5 MG PO TBEC: DELAYED_RELEASE_TABLET | ORAL | 0 refills | Status: DC

## 2022-04-08 MED ORDER — POLYETHYLENE GLYCOL 3350 17 GM/SCOOP PO POWD: ORAL | 0 refills | Status: DC

## 2022-04-08 NOTE — Telephone Encounter (Signed)
Left a message regarding updated bowel prep instructions.  Requested a return call.

## 2022-04-08 NOTE — Patient Instructions (Addendum)
DUE TO COVID-19 ONLY TWO VISITORS  (aged 73 and older)  ARE ALLOWED TO COME WITH YOU AND STAY IN THE WAITING ROOM ONLY DURING PRE OP AND PROCEDURE.   **NO VISITORS ARE ALLOWED IN THE SHORT STAY AREA OR RECOVERY ROOM!!**  IF YOU WILL BE ADMITTED INTO THE HOSPITAL YOU ARE ALLOWED ONLY FOUR SUPPORT PEOPLE DURING VISITATION HOURS ONLY (7 AM -8PM)   The support person(s) must pass our screening, gel in and out, and wear a mask at all times, including in the patient's room. Patients must also wear a mask when staff or their support person are in the room. Visitors GUEST BADGE MUST BE WORN VISIBLY  One adult visitor may remain with you overnight and MUST be in the room by 8 P.M.     Your procedure is scheduled on: 04/20/22   Report to [facility] Main Entrance    Report to admitting at : 1:15 PM   Call this number if you have problems the morning of surgery [phone]   Do not eat food :After Midnight.   Clear liquids starting the day before surgery until : 12:30  PM DAY OF SURGERY  Water Black Coffee (sugar ok, NO MILK/CREAM OR CREAMERS)  Tea (sugar ok, NO MILK/CREAM OR CREAMERS) regular and decaf                             Plain Jell-O (NO RED)                                           Fruit ices (not with fruit pulp, NO RED)                                     Popsicles (NO RED)                                                                  Juice: apple, WHITE grape, WHITE cranberry Sports drinks like Gatorade (NO RED)              Drink 2 Ensure drinks AT :10:00 PM the night before surgery.     The day of surgery:  Drink ONE (1) Pre-Surgery Clear Ensure at : 12:30 PM the morning of surgery. Drink in one sitting. Do not sip.  This drink was given to you during your hospital  pre-op appointment visit. Nothing else to drink after completing the  Pre-Surgery Clear Ensure or G2.          If you have questions, please contact your surgeon's office.  FOLLOW BOWEL PREP AND  ANY ADDITIONAL PRE OP INSTRUCTIONS YOU RECEIVED FROM YOUR SURGEON'S OFFICE!!!   See Bowel Prep instructions  Clear liquids after midnight until 3 hours before surgery. Finish prescribed drink (Pre-Surgery Ensure or G2 if patient is diabetic) by 3 hours before surgery. NPO except meds with a sip of water starting 3 hours before surgery.  [facility]ON BOWEL PREP     FIVE DAYS PRIOR TO YOUR SURGERY   Obtain supplies for  the bowel prep at a pharmacy of your choice: Office sent prescription for your antibiotic pills (Flagyl)  A bottle of Miralax (238 g)- prescription sent in A large bottle of Gatorade/Powerade (64 oz), avoid red/purple coloring- no prescription required Dulcolax tablets (4 tablets)- prescription sent in   Change your diet to make the bowel prep go more easily: Switch to a bland, low fiber diet Stop eating any nuts, popcorn, or fruit with seeds. Stop all fiber supplements such as Metamucil, Miralax, etc.     Improve nutrition: Consider drinking 2-3 nutritional shakes (Ex: Ensure Surgery) every day, starting 5 days prior to surgery     DAY PRIOR TO SURGERY   Switch to a full liquid diet the day before surgery Drink plenty of liquids all day to avoid getting dehydrated     7:00 am Swallow 4 dulcolax tablets with some water   10:00 am Mix the bottle of Miralax with the 64 ounces of Gatorade Drink the Gatorade/Miralax mixture gradually (8 oz glass every 15 minutes) until gone. (You should finish in 4 hours)   2:00pm Take 2 Flagyl (total of 1000 mg) tablets   3:00pm Take 2 Flagyl (total of 1000 mg) tablets Drink plenty of clear liquids all evening to avoid getting dehydrated   10:00pm Take 2 Flagyl (total of 1000 mg) tablets Drink 2 Carbohydrate loading nutrition drinks (ex: Ensure Presurgery). These will be given at pre-op appointment. Do not eat anything solid after bedtime (midnight) the night before your surgery.   BUT DO drink plenty of clear liquids (Water,  Gatorade, juice, soda, coffee, tea, broths, etc.) up to 3 hours prior to surgery to avoid getting dehydrated.     MORNING OF SURGERY   Remember to not to eat anything solid that morning Drink one final carbohydrate loading nutritional drink (ex: Ensure Presurgery) upon waking up in the morning (needs to be 3 hours before your surgery). Hold or take medications as recommended by the hospital staff at your Preoperative visit Stop drinking liquids before you leave the house (>3 hours prior to surgery)              Oral Hygiene is also important to reduce your risk of infection.                                    Remember - BRUSH YOUR TEETH THE MORNING OF SURGERY WITH YOUR REGULAR TOOTHPASTE  DENTURES WILL BE REMOVED PRIOR TO SURGERY PLEASE DO NOT APPLY "Poly grip" OR ADHESIVES!!!   Do NOT smoke after Midnight   Take these medicines the morning of surgery with A SIP OF WATER: amlodipine,flagyl,omeprazole,acyclovir.Tylenol,compazine,Zofran as needed.                              You may not have any metal on your body including hair pins, jewelry, and body piercing             Do not wear make-up, lotions, powders, perfumes/cologne, or deodorant  Do not wear nail polish including gel and S&S, artificial/acrylic nails, or any other type of covering on natural nails including finger and toenails. If you have artificial nails, gel coating, etc. that needs to be removed by a nail salon please have this removed prior to surgery or surgery may need to be canceled/ delayed if the surgeon/ anesthesia feels like they are unable to  be safely monitored.   Do not shave  48 hours prior to surgery.    Do not bring valuables to the hospital. [place].   Contacts, glasses, or bridgework may not be worn into surgery.   Bring small overnight bag day of surgery.   DO NOT [place]. PHARMACY WILL DISPENSE MEDICATIONS  LISTED ON YOUR MEDICATION LIST TO YOU DURING YOUR ADMISSION [place]!    Patients discharged on the day of surgery will not be allowed to drive home.  Someone NEEDS to stay with you for the first 24 hours after anesthesia.   Special Instructions: Bring a copy of your healthcare power of attorney and living will documents         the day of surgery if you haven't scanned them before.              Please read over the following fact sheets you were given: IF YOU HAVE QUESTIONS ABOUT YOUR PRE-OP INSTRUCTIONS PLEASE CALL [phone]    [facility] - Preparing for Surgery Before surgery, you can play an important role.  Because skin is not sterile, your skin needs to be as free of germs as possible.  You can reduce the number of germs on your skin by washing with CHG (chlorahexidine gluconate) soap before surgery.  CHG is an antiseptic cleaner which kills germs and bonds with the skin to continue killing germs even after washing. Please DO NOT use if you have an allergy to CHG or antibacterial soaps.  If your skin becomes reddened/irritated stop using the CHG and inform your nurse when you arrive at Short Stay. Do not shave (including legs and underarms) for at least 48 hours prior to the first CHG shower.  You may shave your face/neck. Please follow these instructions carefully:  1.  Shower with CHG Soap the night before surgery and the  morning of Surgery.  2.  If you choose to wash your hair, wash your hair first as usual with your  normal  shampoo.  3.  After you shampoo, rinse your hair and body thoroughly to remove the  shampoo.                           4.  Use CHG as you would any other liquid soap.  You can apply chg directly  to the skin and wash                       Gently with a scrungie or clean washcloth.  5.  Apply the CHG Soap to your body ONLY FROM THE NECK DOWN.   Do not use on face/ open                           Wound or open sores. Avoid contact with eyes, ears mouth and  genitals (private parts).                       Wash face,  Genitals (private parts) with your normal soap.             6.  Wash thoroughly, paying special attention to the area where your surgery  will be performed.  7.  Thoroughly rinse your body with  warm water from the neck down.  8.  DO NOT shower/wash with your normal soap after using and rinsing off  the CHG Soap.                9.  Pat yourself dry with a clean towel.            10.  Wear clean pajamas.            11.  Place clean sheets on your bed the night of your first shower and do not  sleep with pets. Day of Surgery : Do not apply any lotions/deodorants the morning of surgery.  Please wear clean clothes to the hospital/surgery center.  FAILURE TO FOLLOW THESE INSTRUCTIONS MAY RESULT IN THE CANCELLATION OF YOUR SURGERY PATIENT SIGNATURE_________________________________  NURSE SIGNATURE__________________________________  ________________________________________________________________________ WHAT IS A BLOOD TRANSFUSION? Blood Transfusion Information  A transfusion is the replacement of blood or some of its parts. Blood is made up of multiple cells which provide different functions. Red blood cells carry oxygen and are used for blood loss replacement. White blood cells fight against infection. Platelets control bleeding. Plasma helps clot blood. Other blood products are available for specialized needs, such as hemophilia or other clotting disorders. BEFORE THE TRANSFUSION  Who gives blood for transfusions?  Healthy volunteers who are fully evaluated to make sure their blood is safe. This is blood bank blood. Transfusion therapy is the safest it has ever been in the practice of medicine. Before blood is taken from a donor, a complete history is taken to make sure that person has no history of diseases nor engages in risky social behavior (examples are intravenous drug use or sexual activity with multiple partners). The donor's  travel history is screened to minimize risk of transmitting infections, such as malaria. The donated blood is tested for signs of infectious diseases, such as HIV and hepatitis. The blood is then tested to be sure it is compatible with you in order to minimize the chance of a transfusion reaction. If you or a relative donates blood, this is often done in anticipation of surgery and is not appropriate for emergency situations. It takes many days to process the donated blood. RISKS AND COMPLICATIONS Although transfusion therapy is very safe and saves many lives, the main dangers of transfusion include:  Getting an infectious disease. Developing a transfusion reaction. This is an allergic reaction to something in the blood you were given. Every precaution is taken to prevent this. The decision to have a blood transfusion has been considered carefully by your caregiver before blood is given. Blood is not given unless the benefits outweigh the risks. AFTER THE TRANSFUSION Right after receiving a blood transfusion, you will usually feel much better and more energetic. This is especially true if your red blood cells have gotten low (anemic). The transfusion raises the level of the red blood cells which carry oxygen, and this usually causes an energy increase. The nurse administering the transfusion will monitor you carefully for complications. HOME CARE INSTRUCTIONS  No special instructions are needed after a transfusion. You may find your energy is better. Speak with your caregiver about any limitations on activity for underlying diseases you may have. SEEK MEDICAL CARE IF:  Your condition is not improving after your transfusion. You develop redness or irritation at the intravenous (IV) site. SEEK IMMEDIATE MEDICAL CARE IF:  Any of the following symptoms occur over the next 12 hours: Shaking chills. You have a temperature by mouth above 102  F (38.9 C), not controlled by medicine. Chest, back, or muscle  pain. People around you feel you are not acting correctly or are confused. Shortness of breath or difficulty breathing. Dizziness and fainting. You get a rash or develop hives. You have a decrease in urine output. Your urine turns a dark color or changes to pink, red, or brown. Any of the following symptoms occur over the next 10 days: You have a temperature by mouth above 102 F (38.9 C), not controlled by medicine. Shortness of breath. Weakness after normal activity. The white part of the eye turns yellow (jaundice). You have a decrease in the amount of urine or are urinating less often. Your urine turns a dark color or changes to pink, red, or brown. Document Released: 03/25/2000 Document Revised: 06/20/2011 Document Reviewed: 11/12/2007 Exit[employer] Patient Information 2014 [place].  _______________________________________________________________________  Incentive Spirometer  An incentive spirometer is a tool that can help keep your lungs clear and active. This tool measures how well you are filling your lungs with each breath. Taking long deep breaths may help reverse or decrease the chance of developing breathing (pulmonary) problems (especially infection) following: A long period of time when you are unable to move or be active. BEFORE THE PROCEDURE  If the spirometer includes an indicator to show your best effort, your nurse or respiratory therapist will set it to a desired goal. If possible, sit up straight or lean slightly forward. Try not to slouch. Hold the incentive spirometer in an upright position. INSTRUCTIONS FOR USE  Sit on the edge of your bed if possible, or sit up as far as you can in bed or on a chair. Hold the incentive spirometer in an upright position. Breathe out normally. Place the mouthpiece in your mouth and seal your lips tightly around it. Breathe in slowly and as deeply as possible, raising the piston or the ball toward the top of the  column. Hold your breath for 3-5 seconds or for as long as possible. Allow the piston or ball to fall to the bottom of the column. Remove the mouthpiece from your mouth and breathe out normally. Rest for a few seconds and repeat Steps 1 through 7 at least 10 times every 1-2 hours when you are awake. Take your time and take a few normal breaths between deep breaths. The spirometer may include an indicator to show your best effort. Use the indicator as a goal to work toward during each repetition. After each set of 10 deep breaths, practice coughing to be sure your lungs are clear. If you have an incision (the cut made at the time of surgery), support your incision when coughing by placing a pillow or rolled up towels firmly against it. Once you are able to get out of bed, walk around indoors and cough well. You may stop using the incentive spirometer when instructed by your caregiver.  RISKS AND COMPLICATIONS Take your time so you do not get dizzy or light-headed. If you are in pain, you may need to take or ask for pain medication before doing incentive spirometry. It is harder to take a deep breath if you are having pain. AFTER USE Rest and breathe slowly and easily. It can be helpful to keep track of a log of your progress. Your caregiver can provide you with a simple table to help with this. If you are using the spirometer at home, follow these instructions: [place] IF:  You are having difficultly using the spirometer.  You have trouble using the spirometer as often as instructed. Your pain medication is not giving enough relief while using the spirometer. You develop fever of 100.5 F (38.1 C) or higher. SEEK IMMEDIATE MEDICAL CARE IF:  You cough up bloody sputum that had not been present before. You develop fever of 102 F (38.9 C) or greater. You develop worsening pain at or near the incision site. MAKE SURE YOU:  Understand these instructions. Will watch your  condition. Will get help right away if you are not doing well or get worse. Document Released: 08/08/2006 Document Revised: 06/20/2011 Document Reviewed: 10/09/2006 [facility] Patient Information 2014 [place], [place].   ________________________________________________________________________

## 2022-04-08 NOTE — Telephone Encounter (Signed)
Maria Romero called back and we reviewed the updated bowel prep instructions.  Advised her to pick up her bowel prep and post op medications at [facility].  Also discussed that a blood thinner will be sent in for her after surgery.  She said she does want to pick up all medications after surgery at her pharmacy and does not want to have them supplied by the hospital. She verbalized agreement and understanding of all instructions and knows to call with any questions.

## 2022-04-12 ENCOUNTER — Encounter: Payer: Self-pay | Admitting: *Deleted

## 2022-04-12 ENCOUNTER — Other Ambulatory Visit: Payer: Self-pay

## 2022-04-12 ENCOUNTER — Encounter (HOSPITAL_COMMUNITY): Payer: Self-pay

## 2022-04-12 ENCOUNTER — Encounter (HOSPITAL_COMMUNITY)
Admission: RE | Admit: 2022-04-12 | Discharge: 2022-04-12 | Disposition: A | Discharge status: Home / Self Care | Payer: Medicare Other | Source: Ambulatory Visit | Attending: Gynecologic Oncology | Admitting: Gynecologic Oncology

## 2022-04-12 DIAGNOSIS — Z01812 Encounter for preprocedural laboratory examination: Secondary | ICD-10-CM | POA: Insufficient documentation

## 2022-04-12 DIAGNOSIS — R180 Malignant ascites: Secondary | ICD-10-CM | POA: Insufficient documentation

## 2022-04-12 DIAGNOSIS — C561 Malignant neoplasm of right ovary: Secondary | ICD-10-CM | POA: Diagnosis not present

## 2022-04-12 HISTORY — DX: Anemia, unspecified: D64.9

## 2022-04-12 HISTORY — DX: Dyspnea, unspecified: R06.00

## 2022-04-12 HISTORY — DX: Unspecified fracture of unspecified toe(s), initial encounter for closed fracture: S92.919A

## 2022-04-12 HISTORY — DX: Other specified postprocedural states: R11.2

## 2022-04-12 HISTORY — DX: Nausea with vomiting, unspecified: R11.2

## 2022-04-12 HISTORY — DX: Other complications of anesthesia, initial encounter: T88.59XA

## 2022-04-12 HISTORY — DX: Other specified postprocedural states: Z98.890

## 2022-04-12 LAB — COMPREHENSIVE METABOLIC PANEL
ALT: 29 U/L (ref 0–44)
AST: 27 U/L (ref 15–41)
Albumin: 3.5 g/dL (ref 3.5–5.0)
Alkaline Phosphatase: 64 U/L (ref 38–126)
Anion gap: 10 (ref 5–15)
BUN: 18 mg/dL (ref 8–23)
CO2: 23 mmol/L (ref 22–32)
Calcium: 9.3 mg/dL (ref 8.9–10.3)
Chloride: 105 mmol/L (ref 98–111)
Creatinine, Ser: 0.93 mg/dL (ref 0.44–1.00)
GFR, Estimated: >60 mL/min (ref >60)
Glucose, Bld: 101 mg/dL — ABNORMAL HIGH (ref 70–99)
Potassium: 4.4 mmol/L (ref 3.5–5.1)
Sodium: 138 mmol/L (ref 135–145)
Total Bilirubin: 0.5 mg/dL (ref 0.3–1.2)
Total Protein: 6.5 g/dL (ref 6.5–8.1)

## 2022-04-12 LAB — CBC WITH DIFFERENTIAL/PLATELET
Abs Immature Granulocytes: 0.02 10*3/uL (ref 0.00–0.07)
Basophils Absolute: 0 10*3/uL (ref 0.0–0.1)
Basophils Relative: 0 %
Eosinophils Absolute: 0 10*3/uL (ref 0.0–0.5)
Eosinophils Relative: 1 %
HCT: 32.1 % — ABNORMAL LOW (ref 36.0–46.0)
Hemoglobin: 10 g/dL — ABNORMAL LOW (ref 12.0–15.0)
Immature Granulocytes: 0 %
Lymphocytes Relative: 17 %
Lymphs Abs: 1.1 10*3/uL (ref 0.7–4.0)
MCH: 33.6 pg (ref 26.0–34.0)
MCHC: 31.2 g/dL (ref 30.0–36.0)
MCV: 107.7 fL — ABNORMAL HIGH (ref 80.0–100.0)
Monocytes Absolute: 0.6 10*3/uL (ref 0.1–1.0)
Monocytes Relative: 8 %
Neutro Abs: 5 10*3/uL (ref 1.7–7.7)
Neutrophils Relative %: 74 %
Platelets: 305 10*3/uL (ref 150–400)
RBC: 2.98 MIL/uL — ABNORMAL LOW (ref 3.87–5.11)
RDW: 20.3 % — ABNORMAL HIGH (ref 11.5–15.5)
WBC: 6.7 10*3/uL (ref 4.0–10.5)
nRBC: 0 % (ref 0.0–0.2)

## 2022-04-12 NOTE — Progress Notes (Addendum)
For Short Stay: [place] appointment date:  Bowel Prep reminder:   For Anesthesia: PCP - Dr. Beatrice Lecher Cardiologist -   Chest x-ray - 01/01/22 EKG - 01/01/22 Stress Test -  ECHO - 11/24/21 Cardiac Cath -  Pacemaker/ICD device last checked: Pacemaker orders received: Device Rep notified:  Spinal Cord Stimulator:  Sleep Study -  CPAP -   Fasting Blood Sugar -  Checks Blood Sugar _____ times a day Date and result of last Hgb A1c-  Last dose of GLP1 agonist-  GLP1 instructions:   Last dose of SGLT-2 inhibitors-  SGLT-2 instructions:   Blood Thinner Instructions: Aspirin Instructions: Last Dose:  Activity level: Can go up a flight of stairs and activities of daily living without stopping and without chest pain and/or shortness of breath   Able to exercise without chest pain and/or shortness of breath    Anesthesia review: Hx: HTN  Patient denies shortness of breath, fever, cough and chest pain at PAT appointment   Patient verbalized understanding of instructions that were given to them at the PAT appointment. Patient was also instructed that they will need to review over the PAT instructions again at home before surgery.

## 2022-04-12 NOTE — Progress Notes (Signed)
Most recent chemo cycle cancelled in preparation of surgery. Scheduled for 04/20/22.  Oncology Nurse Navigator Documentation     04/12/2022    1:00 PM  Oncology Nurse Navigator Flowsheets  Phase of Treatment Surgery  Navigator Follow Up Date: 04/20/2022  Navigator Follow Up Reason: Surgery  Navigator Location [facility]  Navigator Encounter Type Appt/Treatment Plan Review  Patient Visit Type MedOnc  Treatment Phase Active Tx  Barriers/Navigation Needs No Barriers At This Time  Interventions None Required  Acuity Level 1-No Barriers  Support Groups/Services Friends and Family  Time Spent with Patient 15

## 2022-04-12 NOTE — Progress Notes (Signed)
Several attempts to reached ostomy nurse were done,unable to get in contact with them.Email was send to the ostomy group on: 04/08/22.

## 2022-04-12 NOTE — Progress Notes (Signed)
Lab. Results: Hemoglobin: 10

## 2022-04-15 ENCOUNTER — Telehealth: Payer: Self-pay

## 2022-04-15 NOTE — Telephone Encounter (Signed)
Received phone call from [facility] requesting another referral for home health nursing and PT. Reviewed with Dr. Marin Olp and Dr. Berline Lopes who both stated patient may not need PT or home health nursing after surgery on 04/20/2022. Per Dr. Marin Olp patient does not need home health nursing at this time as patient is ambulatory and providing her own care at this time.

## 2022-04-19 ENCOUNTER — Telehealth: Payer: Self-pay

## 2022-04-19 NOTE — Telephone Encounter (Addendum)
Telephone call to check on pre-operative status.  Patient compliant with pre-operative instructions.  Reinforced nothing to eat after midnight. Clear liquids until 1230 pm 04-20-22 with Pre-Surgery Clear Ensure completed . Patient to arrive at 1315.  No questions or concerns voiced.  Instructed to call for any needs. Pt understands and is following written bowel prep instructions.

## 2022-04-20 ENCOUNTER — Encounter (HOSPITAL_COMMUNITY): Payer: Self-pay | Admitting: Gynecologic Oncology

## 2022-04-20 ENCOUNTER — Encounter (HOSPITAL_COMMUNITY)
Admission: RE | Disposition: A | Discharge status: Home / Self Care | Payer: Self-pay | Source: Home / Self Care | Attending: Gynecologic Oncology

## 2022-04-20 ENCOUNTER — Other Ambulatory Visit: Payer: Self-pay

## 2022-04-20 ENCOUNTER — Inpatient Hospital Stay (HOSPITAL_COMMUNITY): Payer: Medicare Other | Admitting: Anesthesiology

## 2022-04-20 ENCOUNTER — Inpatient Hospital Stay (HOSPITAL_COMMUNITY)
Admission: RE | Admit: 2022-04-20 | Discharge: 2022-04-25 | DRG: 737 | Disposition: A | Discharge status: Home / Self Care | Payer: Medicare Other | Attending: Gynecologic Oncology | Admitting: Gynecologic Oncology

## 2022-04-20 DIAGNOSIS — N3289 Other specified disorders of bladder: Secondary | ICD-10-CM | POA: Diagnosis not present

## 2022-04-20 DIAGNOSIS — Z87891 Personal history of nicotine dependence: Secondary | ICD-10-CM

## 2022-04-20 DIAGNOSIS — C569 Malignant neoplasm of unspecified ovary: Secondary | ICD-10-CM | POA: Diagnosis present

## 2022-04-20 DIAGNOSIS — G8918 Other acute postprocedural pain: Secondary | ICD-10-CM

## 2022-04-20 DIAGNOSIS — I10 Essential (primary) hypertension: Secondary | ICD-10-CM | POA: Diagnosis not present

## 2022-04-20 DIAGNOSIS — K6389 Other specified diseases of intestine: Secondary | ICD-10-CM

## 2022-04-20 DIAGNOSIS — Z803 Family history of malignant neoplasm of breast: Secondary | ICD-10-CM

## 2022-04-20 DIAGNOSIS — M810 Age-related osteoporosis without current pathological fracture: Secondary | ICD-10-CM | POA: Diagnosis not present

## 2022-04-20 DIAGNOSIS — Z83511 Family history of glaucoma: Secondary | ICD-10-CM | POA: Diagnosis not present

## 2022-04-20 DIAGNOSIS — E7800 Pure hypercholesterolemia, unspecified: Secondary | ICD-10-CM | POA: Diagnosis not present

## 2022-04-20 DIAGNOSIS — R180 Malignant ascites: Secondary | ICD-10-CM | POA: Diagnosis not present

## 2022-04-20 DIAGNOSIS — Z8249 Family history of ischemic heart disease and other diseases of the circulatory system: Secondary | ICD-10-CM | POA: Diagnosis not present

## 2022-04-20 DIAGNOSIS — Z823 Family history of stroke: Secondary | ICD-10-CM | POA: Diagnosis not present

## 2022-04-20 DIAGNOSIS — R42 Dizziness and giddiness: Secondary | ICD-10-CM | POA: Diagnosis not present

## 2022-04-20 DIAGNOSIS — N179 Acute kidney failure, unspecified: Secondary | ICD-10-CM

## 2022-04-20 DIAGNOSIS — Z83438 Family history of other disorder of lipoprotein metabolism and other lipidemia: Secondary | ICD-10-CM

## 2022-04-20 DIAGNOSIS — C561 Malignant neoplasm of right ovary: Secondary | ICD-10-CM | POA: Diagnosis present

## 2022-04-20 DIAGNOSIS — C785 Secondary malignant neoplasm of large intestine and rectum: Secondary | ICD-10-CM | POA: Diagnosis not present

## 2022-04-20 DIAGNOSIS — K660 Peritoneal adhesions (postprocedural) (postinfection): Secondary | ICD-10-CM | POA: Diagnosis not present

## 2022-04-20 DIAGNOSIS — Z9221 Personal history of antineoplastic chemotherapy: Secondary | ICD-10-CM

## 2022-04-20 DIAGNOSIS — K529 Noninfective gastroenteritis and colitis, unspecified: Secondary | ICD-10-CM | POA: Diagnosis not present

## 2022-04-20 DIAGNOSIS — C786 Secondary malignant neoplasm of retroperitoneum and peritoneum: Secondary | ICD-10-CM | POA: Diagnosis not present

## 2022-04-20 DIAGNOSIS — Z833 Family history of diabetes mellitus: Secondary | ICD-10-CM | POA: Diagnosis not present

## 2022-04-20 DIAGNOSIS — D630 Anemia in neoplastic disease: Secondary | ICD-10-CM | POA: Diagnosis not present

## 2022-04-20 HISTORY — PX: HYSTERECTOMY ABDOMINAL WITH SALPINGO-OOPHORECTOMY: SHX6792

## 2022-04-20 HISTORY — PX: DEBULKING: SHX6277

## 2022-04-20 HISTORY — PX: LAPAROSCOPY: SHX197

## 2022-04-20 HISTORY — PX: BOWEL RESECTION: SHX1257

## 2022-04-20 LAB — POCT I-STAT EG7
Acid-base deficit: 6 mmol/L — ABNORMAL HIGH (ref 0.0–2.0)
Bicarbonate: 20.1 mmol/L (ref 20.0–28.0)
Calcium, Ion: 1.19 mmol/L (ref 1.15–1.40)
HCT: 18 % — ABNORMAL LOW (ref 36.0–46.0)
Hemoglobin: 6.1 g/dL — CL (ref 12.0–15.0)
O2 Saturation: 99 %
Patient temperature: 37
Potassium: 3.5 mmol/L (ref 3.5–5.1)
Sodium: 137 mmol/L (ref 135–145)
TCO2: 21 mmol/L — ABNORMAL LOW (ref 22–32)
pCO2, Ven: 41.4 mmHg — ABNORMAL LOW (ref 44–60)
pH, Ven: 7.295 (ref 7.25–7.43)
pO2, Ven: 153 mmHg — ABNORMAL HIGH (ref 32–45)

## 2022-04-20 LAB — BASIC METABOLIC PANEL
Anion gap: 8 (ref 5–15)
BUN: 12 mg/dL (ref 8–23)
CO2: 20 mmol/L — ABNORMAL LOW (ref 22–32)
Calcium: 7.8 mg/dL — ABNORMAL LOW (ref 8.9–10.3)
Chloride: 107 mmol/L (ref 98–111)
Creatinine, Ser: 0.88 mg/dL (ref 0.44–1.00)
GFR, Estimated: >60 mL/min (ref >60)
Glucose, Bld: 188 mg/dL — ABNORMAL HIGH (ref 70–99)
Potassium: 3.5 mmol/L (ref 3.5–5.1)
Sodium: 135 mmol/L (ref 135–145)

## 2022-04-20 LAB — CBC
HCT: 34.2 % — ABNORMAL LOW (ref 36.0–46.0)
Hemoglobin: 11.5 g/dL — ABNORMAL LOW (ref 12.0–15.0)
MCH: 32.3 pg (ref 26.0–34.0)
MCHC: 33.6 g/dL (ref 30.0–36.0)
MCV: 96.1 fL (ref 80.0–100.0)
Platelets: 220 10*3/uL (ref 150–400)
RBC: 3.56 MIL/uL — ABNORMAL LOW (ref 3.87–5.11)
RDW: 21.4 % — ABNORMAL HIGH (ref 11.5–15.5)
WBC: 16.1 10*3/uL — ABNORMAL HIGH (ref 4.0–10.5)
nRBC: 0 % (ref 0.0–0.2)

## 2022-04-20 LAB — PREPARE RBC (CROSSMATCH)

## 2022-04-20 SURGERY — LAPAROSCOPY, DIAGNOSTIC
Anesthesia: General

## 2022-04-20 MED ORDER — OXYCODONE HCL 5 MG/5ML PO SOLN: 5.0000 mg | Freq: Once | ORAL | Status: DC | PRN

## 2022-04-20 MED ORDER — ACETAMINOPHEN 10 MG/ML IV SOLN
1000.0000 mg | Freq: Four times a day (QID) | INTRAVENOUS | Status: AC
  Administered 2022-04-20 – 2022-04-21 (×4): 1000 mg via INTRAVENOUS
  Filled 2022-04-20 (×2): qty 100

## 2022-04-20 MED ORDER — SODIUM CHLORIDE (PF) 0.9 % IJ SOLN
INTRAMUSCULAR | Status: AC
  Filled 2022-04-20: qty 20

## 2022-04-20 MED ORDER — ONDANSETRON HCL 4 MG/2ML IJ SOLN
INTRAMUSCULAR | Status: DC | PRN
  Administered 2022-04-20: 4 mg via INTRAVENOUS

## 2022-04-20 MED ORDER — ACETAMINOPHEN 325 MG PO TABS: 325.0000 mg | ORAL_TABLET | ORAL | Status: DC | PRN

## 2022-04-20 MED ORDER — LACTATED RINGERS IV SOLN: INTRAVENOUS | Status: DC | PRN

## 2022-04-20 MED ORDER — FENTANYL CITRATE PF 50 MCG/ML IJ SOSY
25.0000 ug | PREFILLED_SYRINGE | INTRAMUSCULAR | Status: DC | PRN
  Administered 2022-04-20: 25 ug via INTRAVENOUS

## 2022-04-20 MED ORDER — PROPOFOL 10 MG/ML IV BOLUS
INTRAVENOUS | Status: AC
  Filled 2022-04-20: qty 20

## 2022-04-20 MED ORDER — SODIUM CHLORIDE 0.9 % IV SOLN
2.0000 g | INTRAVENOUS | Status: AC
  Administered 2022-04-20: 2 g via INTRAVENOUS
  Filled 2022-04-20: qty 2

## 2022-04-20 MED ORDER — BUPIVACAINE HCL (PF) 0.25 % IJ SOLN
INTRAMUSCULAR | Status: DC | PRN
  Administered 2022-04-20: 30 mL

## 2022-04-20 MED ORDER — PHENYLEPHRINE HCL-NACL 20-0.9 MG/250ML-% IV SOLN
INTRAVENOUS | Status: DC | PRN
  Administered 2022-04-20: 30 ug/min via INTRAVENOUS

## 2022-04-20 MED ORDER — SODIUM CHLORIDE 0.9 % IV SOLN: INTRAVENOUS | Status: DC | PRN

## 2022-04-20 MED ORDER — PHENYLEPHRINE HCL (PRESSORS) 10 MG/ML IV SOLN
INTRAVENOUS | Status: AC
  Filled 2022-04-20: qty 1

## 2022-04-20 MED ORDER — ONDANSETRON HCL 4 MG PO TABS: 4.0000 mg | ORAL_TABLET | Freq: Four times a day (QID) | ORAL | Status: DC | PRN

## 2022-04-20 MED ORDER — LACTATED RINGERS IV SOLN: INTRAVENOUS | Status: DC

## 2022-04-20 MED ORDER — MIDAZOLAM HCL 2 MG/2ML IJ SOLN
INTRAMUSCULAR | Status: AC
  Filled 2022-04-20: qty 2

## 2022-04-20 MED ORDER — ENSURE PRE-SURGERY PO LIQD: 296.0000 mL | Freq: Once | ORAL | Status: DC

## 2022-04-20 MED ORDER — ACETAMINOPHEN 160 MG/5ML PO SOLN: 325.0000 mg | ORAL | Status: DC | PRN

## 2022-04-20 MED ORDER — ALBUMIN HUMAN 5 % IV SOLN: INTRAVENOUS | Status: DC | PRN

## 2022-04-20 MED ORDER — ORAL CARE MOUTH RINSE: 15.0000 mL | Freq: Once | OROMUCOSAL | Status: AC

## 2022-04-20 MED ORDER — ACETAMINOPHEN 500 MG PO TABS
1000.0000 mg | ORAL_TABLET | ORAL | Status: AC
  Administered 2022-04-20: 1000 mg via ORAL
  Filled 2022-04-20: qty 2

## 2022-04-20 MED ORDER — FENTANYL CITRATE PF 50 MCG/ML IJ SOSY
PREFILLED_SYRINGE | INTRAMUSCULAR | Status: AC
  Filled 2022-04-20: qty 1

## 2022-04-20 MED ORDER — HEPARIN SODIUM (PORCINE) 5000 UNIT/ML IJ SOLN
5000.0000 [IU] | INTRAMUSCULAR | Status: AC
  Administered 2022-04-20: 5000 [IU] via SUBCUTANEOUS
  Filled 2022-04-20: qty 1

## 2022-04-20 MED ORDER — PROPOFOL 10 MG/ML IV BOLUS
INTRAVENOUS | Status: DC | PRN
  Administered 2022-04-20: 100 mg via INTRAVENOUS

## 2022-04-20 MED ORDER — ONDANSETRON HCL 4 MG/2ML IJ SOLN: 4.0000 mg | Freq: Four times a day (QID) | INTRAMUSCULAR | Status: DC | PRN

## 2022-04-20 MED ORDER — BUPIVACAINE HCL 0.25 % IJ SOLN
INTRAMUSCULAR | Status: AC
  Filled 2022-04-20: qty 1

## 2022-04-20 MED ORDER — KCL IN DEXTROSE-NACL 20-5-0.45 MEQ/L-%-% IV SOLN
INTRAVENOUS | Status: DC
  Filled 2022-04-20 (×3): qty 1000

## 2022-04-20 MED ORDER — ROCURONIUM BROMIDE 10 MG/ML (PF) SYRINGE
PREFILLED_SYRINGE | INTRAVENOUS | Status: DC | PRN
  Administered 2022-04-20: 20 mg via INTRAVENOUS
  Administered 2022-04-20: 60 mg via INTRAVENOUS
  Administered 2022-04-20: 40 mg via INTRAVENOUS

## 2022-04-20 MED ORDER — PANTOPRAZOLE SODIUM 40 MG IV SOLR
40.0000 mg | Freq: Every day | INTRAVENOUS | Status: DC
  Administered 2022-04-21 (×2): 40 mg via INTRAVENOUS
  Filled 2022-04-20 (×2): qty 10

## 2022-04-20 MED ORDER — SUGAMMADEX SODIUM 200 MG/2ML IV SOLN
INTRAVENOUS | Status: DC | PRN
  Administered 2022-04-20: 200 mg via INTRAVENOUS

## 2022-04-20 MED ORDER — ONDANSETRON HCL 4 MG/2ML IJ SOLN: 4.0000 mg | Freq: Once | INTRAMUSCULAR | Status: DC | PRN

## 2022-04-20 MED ORDER — DEXAMETHASONE SODIUM PHOSPHATE 10 MG/ML IJ SOLN
INTRAMUSCULAR | Status: AC
  Filled 2022-04-20: qty 1

## 2022-04-20 MED ORDER — ALBUMIN HUMAN 5 % IV SOLN
INTRAVENOUS | Status: AC
  Filled 2022-04-20: qty 250

## 2022-04-20 MED ORDER — OXYCODONE HCL 5 MG PO TABS: 5.0000 mg | ORAL_TABLET | Freq: Once | ORAL | Status: DC | PRN

## 2022-04-20 MED ORDER — ROCURONIUM BROMIDE 10 MG/ML (PF) SYRINGE
PREFILLED_SYRINGE | INTRAVENOUS | Status: AC
  Filled 2022-04-20: qty 10

## 2022-04-20 MED ORDER — CEFAZOLIN SODIUM-DEXTROSE 2-3 GM-%(50ML) IV SOLR
INTRAVENOUS | Status: DC | PRN
  Administered 2022-04-20: 2 g via INTRAVENOUS

## 2022-04-20 MED ORDER — LIDOCAINE HCL 1 % IJ SOLN
INTRAMUSCULAR | Status: AC
  Filled 2022-04-20: qty 20

## 2022-04-20 MED ORDER — CHLORHEXIDINE GLUCONATE 0.12 % MT SOLN
15.0000 mL | Freq: Once | OROMUCOSAL | Status: AC
  Administered 2022-04-20: 15 mL via OROMUCOSAL

## 2022-04-20 MED ORDER — AMLODIPINE BESYLATE 5 MG PO TABS
5.0000 mg | ORAL_TABLET | Freq: Every day | ORAL | Status: DC
  Administered 2022-04-21 – 2022-04-25 (×5): 5 mg
  Filled 2022-04-20 (×5): qty 1

## 2022-04-20 MED ORDER — PHENYLEPHRINE 80 MCG/ML (10ML) SYRINGE FOR IV PUSH (FOR BLOOD PRESSURE SUPPORT)
PREFILLED_SYRINGE | INTRAVENOUS | Status: DC | PRN
  Administered 2022-04-20: 160 ug via INTRAVENOUS
  Administered 2022-04-20: 80 ug via INTRAVENOUS
  Administered 2022-04-20 (×2): 160 ug via INTRAVENOUS
  Administered 2022-04-20: 80 ug via INTRAVENOUS

## 2022-04-20 MED ORDER — MEPERIDINE HCL 50 MG/ML IJ SOLN: 6.2500 mg | INTRAMUSCULAR | Status: DC | PRN

## 2022-04-20 MED ORDER — FENTANYL CITRATE (PF) 100 MCG/2ML IJ SOLN
INTRAMUSCULAR | Status: AC
  Filled 2022-04-20: qty 2

## 2022-04-20 MED ORDER — FENTANYL CITRATE PF 50 MCG/ML IJ SOSY: 25.0000 ug | PREFILLED_SYRINGE | INTRAMUSCULAR | Status: DC | PRN

## 2022-04-20 MED ORDER — CEFAZOLIN SODIUM 1 G IJ SOLR
INTRAMUSCULAR | Status: AC
  Filled 2022-04-20: qty 20

## 2022-04-20 MED ORDER — ENSURE PRE-SURGERY PO LIQD: 592.0000 mL | Freq: Once | ORAL | Status: DC

## 2022-04-20 MED ORDER — BUPIVACAINE LIPOSOME 1.3 % IJ SUSP
INTRAMUSCULAR | Status: DC | PRN
  Administered 2022-04-20: 20 mL

## 2022-04-20 MED ORDER — LIDOCAINE 2% (20 MG/ML) 5 ML SYRINGE
INTRAMUSCULAR | Status: DC | PRN
  Administered 2022-04-20: 30 mg via INTRAVENOUS

## 2022-04-20 MED ORDER — DEXAMETHASONE SODIUM PHOSPHATE 10 MG/ML IJ SOLN
INTRAMUSCULAR | Status: DC | PRN
  Administered 2022-04-20: 8 mg via INTRAVENOUS

## 2022-04-20 MED ORDER — BUPIVACAINE LIPOSOME 1.3 % IJ SUSP
INTRAMUSCULAR | Status: AC
  Filled 2022-04-20: qty 20

## 2022-04-20 MED ORDER — ENOXAPARIN SODIUM 40 MG/0.4ML IJ SOSY
40.0000 mg | PREFILLED_SYRINGE | INTRAMUSCULAR | Status: DC
  Administered 2022-04-21 – 2022-04-24 (×4): 40 mg via SUBCUTANEOUS
  Filled 2022-04-20 (×5): qty 0.4

## 2022-04-20 MED ORDER — KCL IN DEXTROSE-NACL 20-5-0.45 MEQ/L-%-% IV SOLN
INTRAVENOUS | Status: AC
  Filled 2022-04-20: qty 1000

## 2022-04-20 MED ORDER — ONDANSETRON HCL 4 MG/2ML IJ SOLN
INTRAMUSCULAR | Status: AC
  Filled 2022-04-20: qty 2

## 2022-04-20 MED ORDER — STERILE WATER FOR IRRIGATION IR SOLN: Status: DC | PRN

## 2022-04-20 MED ORDER — SODIUM CHLORIDE 0.9 % IV SOLN
2.0000 g | INTRAVENOUS | Status: DC
  Filled 2022-04-20: qty 2

## 2022-04-20 MED ORDER — DEXAMETHASONE SODIUM PHOSPHATE 4 MG/ML IJ SOLN: 4.0000 mg | INTRAMUSCULAR | Status: DC

## 2022-04-20 MED ORDER — HYDROMORPHONE HCL 1 MG/ML IJ SOLN
0.5000 mg | INTRAMUSCULAR | Status: DC | PRN
  Administered 2022-04-21 – 2022-04-22 (×5): 0.5 mg via INTRAVENOUS
  Filled 2022-04-20 (×4): qty 1

## 2022-04-20 MED ORDER — ACETAMINOPHEN 10 MG/ML IV SOLN
INTRAVENOUS | Status: AC
  Filled 2022-04-20: qty 100

## 2022-04-20 MED ORDER — FENTANYL CITRATE (PF) 100 MCG/2ML IJ SOLN
INTRAMUSCULAR | Status: DC | PRN
  Administered 2022-04-20 (×2): 50 ug via INTRAVENOUS

## 2022-04-20 MED ORDER — 0.9 % SODIUM CHLORIDE (POUR BTL) OPTIME
TOPICAL | Status: DC | PRN
  Administered 2022-04-20: 2000 mL

## 2022-04-20 MED ORDER — POVIDONE-IODINE 10 % EX SWAB
2.0000 | Freq: Once | CUTANEOUS | Status: AC
  Administered 2022-04-20: 2 via TOPICAL

## 2022-04-20 MED ORDER — MIDAZOLAM HCL 2 MG/2ML IJ SOLN
INTRAMUSCULAR | Status: DC | PRN
  Administered 2022-04-20: 2 mg via INTRAVENOUS

## 2022-04-20 MED ORDER — METRONIDAZOLE 500 MG/100ML IV SOLN
500.0000 mg | Freq: Once | INTRAVENOUS | Status: AC
  Administered 2022-04-20: 500 mg via INTRAVENOUS
  Filled 2022-04-20: qty 100

## 2022-04-20 SURGICAL SUPPLY — 71 items
ADH SKN CLS APL DERMABOND .7 (GAUZE/BANDAGES/DRESSINGS) ×2
AGENT HMST KT MTR STRL THRMB (HEMOSTASIS) ×2
APL PRP STRL LF DISP 70% ISPRP (MISCELLANEOUS) ×2
BAG COUNTER SPONGE SURGICOUNT (BAG) IMPLANT
BAG SPNG CNTER NS LX DISP (BAG)
CABLE HIGH FREQUENCY MONO STRZ (ELECTRODE) IMPLANT
CHLORAPREP W/TINT 26 (MISCELLANEOUS) ×2 IMPLANT
CLIP TI LARGE 6 (CLIP) IMPLANT
CLIP TI MEDIUM 6 (CLIP) IMPLANT
CLIP TI MEDIUM LARGE 6 (CLIP) IMPLANT
CNTNR URN SCR LID CUP LEK RST (MISCELLANEOUS) IMPLANT
CONT SPEC 4OZ STRL OR WHT (MISCELLANEOUS)
COVER MAYO STAND STRL (DRAPES) IMPLANT
COVER SURGICAL LIGHT HANDLE (MISCELLANEOUS) ×2 IMPLANT
DERMABOND ADVANCED .7 DNX12 (GAUZE/BANDAGES/DRESSINGS) ×2 IMPLANT
DRAPE SURG IRRIG POUCH 19X23 (DRAPES) IMPLANT
DRAPE UTILITY 15X26 TOWEL STRL (DRAPES) IMPLANT
DRAPE WARM FLUID 44X44 (DRAPES) IMPLANT
DRSG OPSITE POSTOP 4X8 (GAUZE/BANDAGES/DRESSINGS) IMPLANT
ELECT BLADE 6 FLAT ULTRCLN (ELECTRODE) IMPLANT
ELECT REM PT RETURN 15FT ADLT (MISCELLANEOUS) ×2 IMPLANT
GAUZE 4X4 16PLY ~~LOC~~+RFID DBL (SPONGE) IMPLANT
GLOVE BIO SURGEON STRL SZ 6 (GLOVE) ×4 IMPLANT
GLOVE BIO SURGEON STRL SZ 6.5 (GLOVE) ×4 IMPLANT
GOWN STRL REUS W/ TWL LRG LVL3 (GOWN DISPOSABLE) ×4 IMPLANT
GOWN STRL REUS W/TWL LRG LVL3 (GOWN DISPOSABLE) ×4
HANDLE SUCTION POOLE (INSTRUMENTS) IMPLANT
HOLDER FOLEY CATH W/STRAP (MISCELLANEOUS) IMPLANT
IRRIG SUCT STRYKERFLOW 2 WTIP (MISCELLANEOUS)
IRRIGATION SUCT STRKRFLW 2 WTP (MISCELLANEOUS) IMPLANT
KIT BASIN OR (CUSTOM PROCEDURE TRAY) ×2 IMPLANT
KIT TURNOVER KIT A (KITS) IMPLANT
LIGASURE IMPACT 36 18CM CVD LR (INSTRUMENTS) IMPLANT
MANIPULATOR UTERINE 4.5 ZUMI (MISCELLANEOUS) IMPLANT
PACK GENERAL/GYN (CUSTOM PROCEDURE TRAY) IMPLANT
PAD POSITIONING PINK XL (MISCELLANEOUS) ×2 IMPLANT
RETRACTOR WND ALEXIS 25 LRG (MISCELLANEOUS) IMPLANT
RTRCTR WOUND ALEXIS 25CM LRG (MISCELLANEOUS) ×2
SCISSORS LAP 5X35 DISP (ENDOMECHANICALS) IMPLANT
SEALER TISSUE G2 CVD JAW 45CM (ENDOMECHANICALS) IMPLANT
SHEET LAVH (DRAPES) ×2 IMPLANT
SLEEVE Z-THREAD 5X100MM (TROCAR) ×2 IMPLANT
SPIKE FLUID TRANSFER (MISCELLANEOUS) IMPLANT
STAPLER CVD CUT GN 40 RELOAD (ENDOMECHANICALS) ×2 IMPLANT
STAPLER CVD CUT GRN 40 RELOAD (ENDOMECHANICALS) IMPLANT
STAPLER ECHELON POWER CIR 29 (STAPLE) IMPLANT
STAPLER PROXIMATE 75MM BLUE (STAPLE) IMPLANT
SUCTION POOLE HANDLE (INSTRUMENTS) ×2
SURGIFLO W/THROMBIN 8M KIT (HEMOSTASIS) IMPLANT
SUT MNCRL AB 4-0 PS2 18 (SUTURE) ×4 IMPLANT
SUT PDS AB 1 TP1 96 (SUTURE) IMPLANT
SUT VIC AB 0 CT1 36 (SUTURE) IMPLANT
SUT VIC AB 2-0 SH 27 (SUTURE) ×2
SUT VIC AB 2-0 SH 27X BRD (SUTURE) IMPLANT
SUT VIC AB 3-0 SH 27 (SUTURE) ×2
SUT VIC AB 3-0 SH 27X BRD (SUTURE) IMPLANT
SUT VIC AB 4-0 PS2 18 (SUTURE) IMPLANT
SUT VIC AB 4-0 PS2 27 (SUTURE) IMPLANT
SUT VICRYL AB 2 0 TIES (SUTURE) IMPLANT
SYR 20ML LL LF (SYRINGE) IMPLANT
SYS BAG RETRIEVAL 10MM (BASKET)
SYS RETRIEVAL 5MM INZII UNIV (BASKET)
SYSTEM BAG RETRIEVAL 10MM (BASKET) IMPLANT
SYSTEM RETRIEVL 5MM INZII UNIV (BASKET) IMPLANT
TOWEL OR 17X26 10 PK STRL BLUE (TOWEL DISPOSABLE) ×2 IMPLANT
TOWEL OR NON WOVEN STRL DISP B (DISPOSABLE) ×2 IMPLANT
TRAY FOLEY MTR SLVR 16FR STAT (SET/KITS/TRAYS/PACK) ×2 IMPLANT
TRAY LAPAROSCOPIC (CUSTOM PROCEDURE TRAY) ×2 IMPLANT
TROCAR ADV FIXATION 12X100MM (TROCAR) IMPLANT
TROCAR BALLN 12MMX100 BLUNT (TROCAR) IMPLANT
TROCAR Z-THREAD OPTICAL 5X100M (TROCAR) ×2 IMPLANT

## 2022-04-20 NOTE — Consult Note (Signed)
[facility] Nurse requested for preoperative stoma site marking  Discussed surgical procedure and stoma creation with patient.  Explained role of the [place] nurse team.  Answered patient  questions.   Examined patient lying and sitting in order to place the marking in the patient's visual field, away from any creases or abdominal contour issues and within the rectus muscle.  Attempted to mark below the patient's belt line.   Marked for colostomy in the LLQ  _4___ cm to the left of the umbilicus and __5__TD below the umbilicus.  Patient's abdomen cleansed with CHG wipes at site markings, allowed to air dry prior to marking.Covered mark with thin film transparent dressing to preserve mark.    [place] Nurse team will follow up with patient after surgery for continue ostomy care and teaching.   Thank you,   Alexza Norbeck MSN, RN-BC, [employer]

## 2022-04-20 NOTE — Brief Op Note (Signed)
04/20/2022  9:49 PM  PATIENT:  Maria Romero  72 y.o. female  PRE-OPERATIVE DIAGNOSIS:  OVARIAN CANCER  POST-OPERATIVE DIAGNOSIS:  OVARIAN CANCER  PROCEDURE:  Procedure(s): LAPAROSCOPY DIAGNOSTIC,CYSTO (N/A) HYSTERECTOMY ABDOMINAL WITH BILATERAL SALPINGO-OOPHORECTOMY (Bilateral) TUMOR DEBULKING, OMENTECTOMY (N/A) BOWEL RESECTION; DIVERTING ILEOSTOMY (N/A)  SURGEON:  Surgeon(s) and Role:    [name], MD - Primary    Lahoma Crocker, MD - Assisting  EBL:  1000 mL   BLOOD ADMINISTERED:2u pRBCs, albumin  DRAINS: 15 JP drain to LLQ   LOCAL MEDICATIONS USED:  exparel and 0.25% marcaine  SPECIMEN:  small bowel adhesions, small bowel nodules, omentum, en bloc resection of bilateral tubes, ovaries, uterus and cervix, with rectosigmoid colon  DISPOSITION OF SPECIMEN:  PATHOLOGY  COUNTS:  YES  TOURNIQUET:  * No tourniquets in log *  DICTATION: .Note written in EPIC  PLAN OF CARE: Admit to inpatient   PATIENT DISPOSITION:  PACU - hemodynamically stable.   Delay start of Pharmacological VTE agent (>24hrs) due to surgical blood loss or risk of bleeding: no

## 2022-04-20 NOTE — Op Note (Signed)
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  Postoperative Diagnosis: same as above  Surgery: Diagnostic laparoscopy, exploratory laparotomy with lysis of adhesions for approximately 45 minutes, omentectomy including mobilization of the splenic flexure, radical tumor debulking with en bloc resection of bilateral adnexa, uterus, cervix and rectosigmoid colon, end-to-end colonic reanastomosis, diverting loop ileostomy, cystoscopy  Surgeons:  Johnnye Lana, MD   Anesthesia: General   Estimated blood loss: 1000 cc  IVF: see I&O flowsheet, 2u pRBCs, albumin  Urine output: 024 cc  Complications: None apparent  Pathology: small bowel adhesions, small bowel nodules, omentum, cervix, uterus, bilateral tubes and ovaries, rectosigmoid colon  Operative findings: On EUA, fixed pelvic mass with nodularity appreciated within the posterior cul-de-sac.  On intra-abdominal entry the laparoscope, upper abdominal adhesions limiting assessment of the left upper quadrant.  Large, cystic mass fills the mid abdomen.  On laparotomy, approximately 22 cm cystic mass spans from the deep pelvis to the upper abdomen with a rind.  This lesion appears to be walling off the intra-abdominal ascites.  Multiple loops of small bowel are adherent to the cystic lesion.  There is significant filmy adhesive disease between loops of small bowel, the bowel and cystic lesion, and the small bowel and anterior abdominal wall.  Also numerous less than 1 cm white nodules noted along the small bowel and mesentery (frequently within filmy adhesions), most consistent with treated tumor.  During lysis of adhesions, 2 areas of serosal injury noted along the ileum, oversewn.  Approximately 4-6 cm tumor implant within the greater omentum and omental nodularity extending along the omentum to the splenic flexure.  Lesser sac  without disease.  Stomach normal in appearance.,  Adhesions between the liver and the anterior abdominal wall bilaterally, no nodularity appreciated along either the liver or diaphragm.  Appendix itself normal-appearing, appendiceal mesentery minimally adherent to the right adnexa.  Normal cecum, ascending colon, transverse colon, and descending colon.  Large cystic mass noted to be adherent to the bladder peritoneum and uterus anteriorly within the pelvis.  Left adnexa is somewhat distorted.  Right ovary replaced by an 8 cm cystic mass.  Normal-appearing distal right fallopian fimbria.  Area of the sigmoid colon adherent posterior to the right adnexa, requiring an en bloc resection.  Somewhat woody texture palpated along the rectovaginal septum after reverse hysterectomy performed.  Negative bubble test after rectosigmoid anastomosis.  In the setting of planned additional treatment and recent receipt of bevacizumab, decision made to perform diverting loop ileostomy. Multiple adhesions and nodular implants sent.  If these are negative for malignancy or show treated tumor, then this was an R0 resection.  Otherwise, R1 resection. On cystoscopy, bladder dome intact. Good efflux seen from bilateral ureteral orifices.  Procedure: The patient was identified in the preoperative holding area. Informed consent was signed on the chart. Patient was seen history was reviewed and exam was performed.   The patient was then taken to the operating room and placed in the supine position with SCD hose on. General anesthesia was then induced without difficulty. She was then placed in the dorsolithotomy position. The abdomen was prepped with chlor prep sponges per protocol. Perineum was prepped with Betadine. The vagina was prepped with Betadine a Foley catheter was inserted into the bladder under sterile conditions.  Arms were left untouched.  Padding and tape were used used across patient's chest and secured to the table given  planned diagnostic laparoscopy.  The patient was then draped after the prep was dried. Timeout was performed the patient, procedure, antibiotic, allergy, and length of procedure.   A 5 mm incision was made in the left upper quadrant.  The Visiport was then placed under direct visualization.  Once intra-abdominal entry had been confirmed, the abdomen was insufflated to 15 mmHg.  Findings were as noted above.  Abdomen was left insufflated, camera removed.  A vertical midline infraumbilical incision was and carried down to the underlying fascia using Bovie cautery. The fascia was scored in the fascial incision was extended superiorly and inferiorly using Bovie cautery. The rectus bellies were dissected off the overlying fascia. The peritoneum was tented and entered. The peritoneal incision was extended superiorly and inferiorly with visualization of the underlying peritoneal cavity.  Abdomen was inspected with findings noted above.  Attention was first turned to mobilizing multiple loops of small bowel adherent to the large cystic mass.  This was done bluntly and with Metzenbaums.  During this dissection, there was mild deserosalization of 2 loops of small bowel, both oversewn with 2-0 Vicryl in interrupted fashion.  Once small bowel had been mobilized, attention was then turned to mobilization of the cystic lesion which had attachments to bilateral abdominal sidewalls.  During this mobilization, the cystic mass was entered with drainage of brown-tinged straw-colored fluid.  Once decompressed, the cystic mass was bluntly and sharply dissected free from attachments to bilateral abdominal sidewalls and anteriorly to the bladder peritoneum and uterus.  Ultimately, this cystic lesion appeared to be encompassing all of the intra-abdominal ascites although the left adnexa was somewhat indistinct.    Attention was then turned to the appendix which was sharply dissected free from the right adnexal mass to help mobilize  the cecum and terminal ileum.  At this point, the Bookwalter self-retaining retractor was placed after a large wound protector was inserted.  Initial placement of the Bookwalter and at several points during the case, the lateral blades were checked to ensure no significant pressure on the psoas bellies.  The small and large bowel were packed out of the way of the surgical field with moist laparotomy sponges and malleable retractors were attached to the Ainsworth.  Given distortion of the anatomy from the large cyst wall, the uterus was not initially identifiable.  Attention was thus turned to the pelvic sidewalls.  The round ligaments on bilateral sidewalls were transected with monopolar electrocautery and the anterior and posterior leaves of the broad ligament were opened.  The ureter was identified along the medial leaf of the broad ligament and a window was made with electrocautery between the IP vessels and the ureter.  The vessels were clamped, cauterized with bipolar electrocautery using the LigaSure device, and transected.  On the right, the pedicle was suture-ligated with a 2-0 Vicryl tie.  Ultimately, with further mobilization of the cystic lesion from the anterior uterus, the uterine fundus was identified and 2 Kelly clamps were placed on either uterine cornua.  At this point, the cystic lesion replacing the right ovary was noted to be quite adherent to the sigmoid colon and on palpation, the sigmoid colon did not feel separate from the posterior right adnexal/uterine process.  Decision was made to transect the sigmoid colon at the pelvic brim.  A window was made in the mesentery above the affected colon.  The GIA stapling device was passed through the window and fired.  The ureters were palpated laterally, and the ligasure device was used  to transect and seal the left colonic mesentery.  Distal branches of the IMA and IMV were identified during this dissection and sealed with bipolar electrocautery.   The proximal end of the sigmoid was then packed into the upper abdomen.  Ureterolysis was then performed after the ureter was freed 360 from the medial leaf of the broad ligament and tagged with a vessel loop bilaterally.  Using bipolar electrocautery, the right adnexal mass was freed along the medial leaf of the broad ligament to just lateral to the uterus.    Attention was turned anteriorly and using monopolar electrocautery and blunt dissection, the bladder flap was created.  A sponge stick was placed in the vagina to help delineate the cervicovaginal junction.  Once the bladder had been dropped inferior to this cervicovaginal junction, attention was turned back to skeletonizing the uterine arteries.  These were cauterized and transected using bipolar electrocautery.  On the left, during this dissection, the uterine artery was encountered more laterally to its initial transection.  The uterine artery was clipped with multiple clips, ultimately achieving hemostasis.  The left ureter was traced down to the level of the uterine artery.  Given difficulty mobilizing within the posterior pelvis, decision made to proceed with reverse hysterectomy.  Using monopolar electrocautery, the colpotomy was started anteriorly until the sponge stick visualized.  Curved clamps were then used to take sequential pedicles along the vagina, just inferior to the cervix.  These were transected using Mayo scissors and suture ligated with 0 Vicryl.  Once the lateral pedicles had been transected and suture-ligated, monopolar electrocautery was used to incise the posterior vagina until the rectovaginal septum was encountered.  Attention was turned posteriorly again. With upward traction on the distal end of the transected sigmoid colon, the prerectal space was mobilized using monopolar and bipolar electrocautery, ensuring both ureters were seen and lateral to this dissection.  The right adnexal mass was further mobilized from the  right pelvic sidewall during this dissection.  Ultimately, the uterus was able to be elevated, revealing rectovaginal septum with normal rectum posteriorly.  Area of the rectum to be transected was identified below the level of involved colon.  The curved contour stapler was then used to staple and transect across the rectal tube, approximately 12 cm from the anal verge, and surrounding mesentery, ultimately freeing the en bloc rectosigmoid colon, uterus, cervix, and bilateral adnexa as well as decompressed cyst.  This was handed off the field.  Proximal end of the sigmoid was then brought down into the pelvis with plenty of mobilization for tension-free anastomosis.  Attention was turned to the colpotomy.  The vagina was closed with interrupted figure-of-eight sutures using 0 Vicryl, all tagged.  The posterior cul-de-sac was inspected and palpated with no residual tumor notified.  The pelvis was then copiously irrigated with hemostasis noted.  At this point, tension was turned back to the upper abdomen.  The small bowel was unpacked and the bowel was run from the cecum to the ligament of Treitz.  Some small nodules and adhesions were resected to be sent for pathology.  The omentum was inspected with retraction of the infracolic omentum and visual tumor burden.  An omentectomy was then performed using a combination of monopolar electrocautery and the LigaSure device.  The infra gastric omentum was mostly a bit look to be salvaged although a portion of the omentum above the transverse colon was respected to remove all palpable tumor.  This was after identification of the lesser sac which  was visually and palpably normal.  Some tumor implants extended along the omentum up to the level of the splenic flexure.  Attention was turned to the left and the descending colon was mobilized along the white line of Toldt up to the splenic flexure.  The LigaSure device was then used to cauterize and transect the omentum to  remove all remaining palpable disease.  Omentum was then handed off the field.  Attention was turned back to the pelvis.  The small bowel was packed into the upper abdomen using the Bookwalter retractor.  A blue towel was placed around the proximal sigmoid and the suture line excised.  The EEA sizers were used to measure the colonic width. A 29 mm EEA sizer was accommodated and selected.  The 29 mm EEA stapler was opened and the anvil was placed within the proximal sigmoid.  A pursestring suture was used circumferentially with prolene to secure the anvil. The distal 2cm of this sigmoid colon margin was skeletonized free of its fatty tissue so that bare colonic tissue was exposed on the edges of the anvil. The rectal stump was inspected and skeletonized in a similar fashion. The EEA anastamosing device was inserted by the surgeon into the anus and gently advanced to the rectal stump.  The spike was deployed posterior to the transverse staple line on the rectal stump. The mesentery was confirmed to be free of the anatomosis. The EEA anastamosing stapler was closed and the sigmoid was delivered onto the rectal stump in a tension free manner. The stapler was activated and then removed.  Two intact donuts were visualized.  The staple line was examined and visually intact.   The pelvis was filled with saline, the sigmoid (proximal) occluded mannually and the rectum was filled with gas to confirm there were no leaks from the coloproctostomy staple line.    Given estimated blood loss and low urine output, decision made to proceed with cystoscopy.  Surgeon gloves were changed.  The bladder was instilled with approximately 200 cc of sterile fluid.  Foley catheter was removed and cystoscopy was performed with findings noted above.  New Foley catheter was then placed and the bladder was decompressed.  Surgeons glove and down were changed again.  Pelvis was copiously irrigated again.  Given raw surface of the rectovaginal  septum, Surgiflo was placed along the surgical bed and pressure held.  Good hemostasis was noted.  Colpotomy sutures were then trimmed.  An incision was made in the left lower quadrant with a scalpel and Claiborne Billings used to dissect through the subcutaneous tissue and fascia.  A Blake drain was placed within the pelvis and nylon suture used to secure the drain to the skin in the left lower quadrant.  The Bookwalter retractor was taken down.  The small bowel was run again.  Hemostasis was assured along the area of mobilization of the splenic flexure.  The wound protector was removed.  An area approximately 20 cm from the ileocecal valve, where there had been disruption of the serosa of the ileum was identified for the loop diverting ileostomy.  Kocher's were used to elevate the skin in the right mid to upper abdomen. Monopolar electrocautery was used to excise a round skin incision.  The subcutaneous tissue was dissected down to the fascia which was opened in cruciate form.  The muscle fibers of the rectus were separated medial laterally and the peritoneal incision in size.  The incision was made large enough to assure 2 fingers could be  easily passed through the incision.  The loop of small bowel to be used was then grasped and brought up through the incision to assure sufficient ileum above the level of the skin and assuring that the proximal ileum is located inferiorly.  Tonsils were then used to create a window within the mesentery below the ileum and a red rubber catheter drain was passed through this, cut to a length just more than the incision and sutured to the skin using a drain stitch.   Since and scrub tech gloves were all changed.  The area around the abdominal incision was redraped and new instrument set used for abdominal closure.  The fascia was closed using running mass closure of #1 PDS. The subcutaneous tissues were irrigated and made hemostatic. Exparel mixed with 0.25% marcaine was injected for  postoperative pain control. The subcutaneous tissue was reapproximated with 2-0 Vicryl. The skin was closed using staples.  The laparotomy incision was covered.   A transverse incision was made along the loop of ileum. Allis clamps were then used to evert the wall of the bowel.  The stoma was matured using a combination of 2 and 3-0 Vicryl.  The finger was passed through each limb of the stoma ensuring easy passage without constriction.     An ostomy bag was placed over the ileostomy and a honey comb dressing over the midline incision.   All instrument, suture, laparotomy, Ray-Tec, and needle counts were correct x2. The patient tolerated the procedure well and was taken recovery room in stable condition.   Jeral Pinch MD Gynecologic Oncology

## 2022-04-20 NOTE — Transfer of Care (Signed)
Immediate Anesthesia Transfer of Care Note  Patient: Maria Romero  Procedure(s) Performed: LAPAROSCOPY DIAGNOSTIC,CYSTO HYSTERECTOMY ABDOMINAL WITH BILATERAL SALPINGO-OOPHORECTOMY (Bilateral) TUMOR DEBULKING, OMENTECTOMY BOWEL RESECTION; DIVERTING ILEOSTOMY  Patient Location: PACU  Anesthesia Type:General  Level of Consciousness: awake, alert , and oriented  Airway & Oxygen Therapy: Patient Spontanous Breathing and Patient connected to face mask oxygen  Post-op Assessment: Report given to RN and Post -op Vital signs reviewed and stable  Post vital signs: Reviewed and stable  Last Vitals:  Vitals Value Taken Time  BP 128/77 04/20/22 2201  Temp    Pulse 68 04/20/22 2213  Resp 15 04/20/22 2213  SpO2 97 % 04/20/22 2213  Vitals shown include unvalidated device data.  Last Pain:  Vitals:   04/20/22 1430  TempSrc:   PainSc: 0-No pain         Complications: No notable events documented.

## 2022-04-20 NOTE — Anesthesia Procedure Notes (Signed)
Procedure Name: Intubation Date/Time: 04/20/2022 4:30 PM  Performed by: Niel Hummer, CRNAPre-anesthesia Checklist: Patient identified, Emergency Drugs available, Patient being monitored and Suction available Patient Re-evaluated:Patient Re-evaluated prior to induction Oxygen Delivery Method: Circle system utilized Preoxygenation: Pre-oxygenation with 100% oxygen Induction Type: IV induction Ventilation: Mask ventilation without difficulty Laryngoscope Size: Mac and 4 Grade View: Grade II Tube type: Oral Tube size: 7.0 mm Number of attempts: 1 Airway Equipment and Method: Bougie stylet Placement Confirmation: ETT inserted through vocal cords under direct vision, positive ETCO2 and breath sounds checked- equal and bilateral Secured at: 23 cm Tube secured with: Tape Dental Injury: Teeth and Oropharynx as per pre-operative assessment  Comments: Grade 2b view, unable to angle tube. Bougie used, passed tube easily.

## 2022-04-20 NOTE — Interval H&P Note (Signed)
History and Physical Interval Note:  04/20/2022 2:05 PM  Maria Romero  has presented today for surgery, with the diagnosis of OVARIAN CANCER.  The various methods of treatment have been discussed with the patient and family. After consideration of risks, benefits and other options for treatment, the patient has consented to  Procedure(s): LAPAROSCOPY DIAGNOSTIC (N/A) HYSTERECTOMY ABDOMINAL WITH BILATERAL SALPINGO-OOPHORECTOMY (Bilateral) TUMOR DEBULKING (N/A) POSSIBLE BOWEL RESECTION; POSSIBLE OSTOMY (N/A) as a surgical intervention.  The patient's history has been reviewed, patient examined, no change in status, stable for surgery.  I have reviewed the patient's chart and labs.  Questions were answered to the patient's satisfaction.     [name]

## 2022-04-20 NOTE — Anesthesia Preprocedure Evaluation (Addendum)
Anesthesia Evaluation  Patient identified by MRN, date of birth, ID band Patient awake    Reviewed: Allergy & Precautions, H&P , NPO status , Patient's Chart, lab work & pertinent test results  History of Anesthesia Complications (+) PONV and history of anesthetic complications  Airway Mallampati: II  TM Distance: >3 FB Neck ROM: Full    Dental no notable dental hx. (+) Teeth Intact, Dental Advisory Given   Pulmonary neg pulmonary ROS, shortness of breath, former smoker   Pulmonary exam normal breath sounds clear to auscultation       Cardiovascular Exercise Tolerance: Good hypertension, negative cardio ROS Normal cardiovascular exam Rhythm:Regular Rate:Normal     Neuro/Psych negative neurological ROS  negative psych ROS   GI/Hepatic negative GI ROS, Neg liver ROS,,,  Endo/Other  negative endocrine ROS    Renal/GU ARFRenal diseasenegative Renal ROS  negative genitourinary   Musculoskeletal negative musculoskeletal ROS (+)    Abdominal   Peds negative pediatric ROS (+)  Hematology negative hematology ROS (+) Blood dyscrasia, anemia   Anesthesia Other Findings   Reproductive/Obstetrics negative OB ROS                             Anesthesia Physical Anesthesia Plan  ASA: 3  Anesthesia Plan: General   Post-op Pain Management: Tylenol PO (pre-op)* and Celebrex PO (pre-op)*   Induction:   PONV Risk Score and Plan: 4 or greater and Ondansetron, Dexamethasone and Treatment may vary due to age or medical condition  Airway Management Planned: Oral ETT  Additional Equipment: None  Intra-op Plan:   Post-operative Plan: Extubation in OR  Informed Consent: I have reviewed the patients History and Physical, chart, labs and discussed the procedure including the risks, benefits and alternatives for the proposed anesthesia with the patient or authorized representative who has indicated  his/her understanding and acceptance.       Plan Discussed with: Anesthesiologist and CRNA  Anesthesia Plan Comments:        Anesthesia Quick Evaluation

## 2022-04-21 ENCOUNTER — Encounter (HOSPITAL_COMMUNITY): Payer: Self-pay | Admitting: Gynecologic Oncology

## 2022-04-21 ENCOUNTER — Encounter: Payer: Self-pay | Admitting: *Deleted

## 2022-04-21 LAB — CBC
HCT: 31.6 % — ABNORMAL LOW (ref 36.0–46.0)
Hemoglobin: 10.5 g/dL — ABNORMAL LOW (ref 12.0–15.0)
MCH: 31.9 pg (ref 26.0–34.0)
MCHC: 33.2 g/dL (ref 30.0–36.0)
MCV: 96 fL (ref 80.0–100.0)
Platelets: 224 10*3/uL (ref 150–400)
RBC: 3.29 MIL/uL — ABNORMAL LOW (ref 3.87–5.11)
RDW: 22.2 % — ABNORMAL HIGH (ref 11.5–15.5)
WBC: 10.6 10*3/uL — ABNORMAL HIGH (ref 4.0–10.5)
nRBC: 0 % (ref 0.0–0.2)

## 2022-04-21 LAB — TYPE AND SCREEN
ABO/RH(D): A NEG
Antibody Screen: NEGATIVE
Unit division: 0
Unit division: 0

## 2022-04-21 LAB — BASIC METABOLIC PANEL
Anion gap: 7 (ref 5–15)
BUN: 11 mg/dL (ref 8–23)
CO2: 23 mmol/L (ref 22–32)
Calcium: 7.9 mg/dL — ABNORMAL LOW (ref 8.9–10.3)
Chloride: 104 mmol/L (ref 98–111)
Creatinine, Ser: 0.93 mg/dL (ref 0.44–1.00)
GFR, Estimated: >60 mL/min (ref >60)
Glucose, Bld: 214 mg/dL — ABNORMAL HIGH (ref 70–99)
Potassium: 4.2 mmol/L (ref 3.5–5.1)
Sodium: 134 mmol/L — ABNORMAL LOW (ref 135–145)

## 2022-04-21 LAB — MRSA NEXT GEN BY PCR, NASAL: MRSA by PCR Next Gen: NOT DETECTED

## 2022-04-21 LAB — BPAM RBC
Blood Product Expiration Date: 202402062359
Blood Product Expiration Date: 202402062359
ISSUE DATE / TIME: 202401102006
ISSUE DATE / TIME: 202401102006
Unit Type and Rh: 600
Unit Type and Rh: 600

## 2022-04-21 LAB — MAGNESIUM
Magnesium: 0.9 mg/dL — CL (ref 1.7–2.4)
Magnesium: 0.9 mg/dL — CL (ref 1.7–2.4)
Magnesium: 1.8 mg/dL (ref 1.7–2.4)

## 2022-04-21 LAB — PHOSPHORUS: Phosphorus: 3.6 mg/dL (ref 2.5–4.6)

## 2022-04-21 MED ORDER — HYDROMORPHONE HCL 1 MG/ML IJ SOLN
INTRAMUSCULAR | Status: AC
  Filled 2022-04-21: qty 1

## 2022-04-21 MED ORDER — MAGNESIUM SULFATE 2 GM/50ML IV SOLN: 2.0000 g | Freq: Once | INTRAVENOUS | Status: DC

## 2022-04-21 MED ORDER — CHLORHEXIDINE GLUCONATE CLOTH 2 % EX PADS
6.0000 | MEDICATED_PAD | Freq: Every day | CUTANEOUS | Status: DC
  Administered 2022-04-21 – 2022-04-25 (×5): 6 via TOPICAL

## 2022-04-21 MED ORDER — ACETAMINOPHEN 10 MG/ML IV SOLN
INTRAVENOUS | Status: AC
  Filled 2022-04-21: qty 100

## 2022-04-21 MED ORDER — MAGNESIUM SULFATE 2 GM/50ML IV SOLN
2.0000 g | INTRAVENOUS | Status: AC
  Administered 2022-04-21: 2 g via INTRAVENOUS
  Filled 2022-04-21: qty 50

## 2022-04-21 NOTE — Progress Notes (Signed)
Patient had her surgery on 04/20/2022. Will follow for path.   Oncology Nurse Navigator Documentation     04/21/2022    8:00 AM  Oncology Nurse Navigator Flowsheets  Navigator Follow Up Date: 04/25/2022  Navigator Follow Up Reason: Pathology  Navigator Location [facility]  Navigator Encounter Type Appt/Treatment Plan Review  Patient Visit Type MedOnc  Treatment Phase Active Tx  Barriers/Navigation Needs No Barriers At This Time  Interventions None Required  Acuity Level 1-No Barriers  Support Groups/Services Friends and Family  Time Spent with Patient 15

## 2022-04-21 NOTE — Progress Notes (Signed)
1 Day Post-Op Procedure(s) (LRB): LAPAROSCOPY DIAGNOSTIC,CYSTO (N/A) HYSTERECTOMY ABDOMINAL WITH BILATERAL SALPINGO-OOPHORECTOMY (Bilateral) TUMOR DEBULKING, OMENTECTOMY (N/A) BOWEL RESECTION; DIVERTING ILEOSTOMY (N/A)  Subjective: Patient reports waking up early this am feeling chatty. Pain has been controlled with prn medications. No chest pain or dyspnea. No nausea or emesis reported. No concerns voiced.   Objective: Vital signs in last 24 hours: Temp:  [96.7 F (35.9 C)-98.9 F (37.2 C)] 98.9 F (37.2 C) (01/11 8657) Pulse Rate:  [69-82] 69 (01/11 0700) Resp:  [12-21] 18 (01/11 0700) BP: (114-140)/(56-84) 114/62 (01/11 0700) SpO2:  [99 %-100 %] 100 % (01/11 0700) Weight:  [125 lb 10.6 oz (57 kg)-131 lb 9.8 oz (59.7 kg)] 131 lb 9.8 oz (59.7 kg) (01/11 8469)    Intake/Output from previous day: 01/10 0701 - 01/11 0700 In: 7445.1 [I.V.:5897.1; Blood:598; IV Piggyback:950] Out: 2100 [Urine:600; Emesis/NG output:200; Drains:250; Stool:36; Blood:1000]  Physical Examination: General: alert, cooperative, and no distress Resp: clear to auscultation bilaterally Cardio: regular rate and rhythm, S1, S2 normal, no murmur, click, rub or gallop GI: incision: midline abdominal incision with op site dressing in place with no drainage noted underneath and abdomen soft, moderately hypoactive bowel sounds, ileostomy with small amount of liquid brown output, stoma pink, NG tube to intermittent suction with brown output Extremities: extremities normal, atraumatic, no cyanosis or edema  Labs: WBC/Hgb/Hct/Plts:  16.1/11.5/34.2/220 (01/10 2218) BUN/Cr/glu/ALT/AST/amyl/lip:  12/0.88/--/--/--/--/-- (01/10 2218)  Assessment: 72 y.o. s/p Procedure(s): LAPAROSCOPY DIAGNOSTIC,CYSTO, HYSTERECTOMY ABDOMINAL WITH BILATERAL SALPINGO-OOPHORECTOMY, TUMOR DEBULKING, OMENTECTOMY, BOWEL RESECTION; DIVERTING ILEOSTOMY: stable Pain:  Pain is well-controlled on PRN medications.  Heme: Hgb 10.5 this am from 11.5  and Hct 31.6 from 34.2 last pm. Surgical EBL 1000 cc. Given 2 units PRBCs and albumin intra-op. Continue to monitor.   ID: WBC 16.1 this am. Given cefoxitin intra-op along with decadron. No evidence of infection at this time. Continue to monitor.  CV: BP and HR stable. Continue to monitor. HR in 70s. On cardiac monitoring. Hx HTN-norvasc ordered.  GI:  Tolerating po: No, NPO with NG tube in place. Plan for NG tube clamping trial this am. If minimal residual with no nausea/emesis, plan for removal with sips of clears. Ileostomy with liquid output.  GU: Foley in place. Creatinine 0.88 this am. 600 cc of urine reported yesterday and through the evening. Around 200 cc in foley currently.    FEN: Magnesium returned as critical at 0.9. Plan for stat repeat then replacement IV. Phos 3.6.   Endo: No known hx of diabetes. Glucose on am Bmet 214. Continue to monitor.  Prophylaxis: SCDs. Lovenox ordered starting today at 18:00  Plan: -Plan for NG tube clamping with checking residual in 4 hours. If minimal output and no nausea/emesis, plan for NG tube removal with sips of clears. -Encourage IS use, increasing mobility -Ostomy RN to see patient -Am labs -Continue plan of care per Dr. Berline Lopes  Later update: Given Mag at 0.9, plan for stat repeat and begin replacement.   LOS: 1 day    [name] 04/21/2022, 7:45 AM

## 2022-04-21 NOTE — Anesthesia Postprocedure Evaluation (Signed)
Anesthesia Post Note  Patient: AURIAH HOLLINGS  Procedure(s) Performed: LAPAROSCOPY DIAGNOSTIC,CYSTO HYSTERECTOMY ABDOMINAL WITH BILATERAL SALPINGO-OOPHORECTOMY (Bilateral) TUMOR DEBULKING, OMENTECTOMY BOWEL RESECTION; DIVERTING ILEOSTOMY     Patient location during evaluation: PACU Anesthesia Type: General Level of consciousness: awake and alert Pain management: pain level controlled Vital Signs Assessment: post-procedure vital signs reviewed and stable Respiratory status: spontaneous breathing, nonlabored ventilation, respiratory function stable and patient connected to nasal cannula oxygen Cardiovascular status: blood pressure returned to baseline and stable Postop Assessment: no apparent nausea or vomiting Anesthetic complications: no  No notable events documented.  Last Vitals:  Vitals:   04/21/22 0000 04/21/22 0100  BP: 124/76 117/73  Pulse: 71 73  Resp: 13 13  Temp: (!) 36.3 C   SpO2: 100% 100%    Last Pain:  Vitals:   04/21/22 0115  TempSrc:   PainSc: 0-No pain                 Delitha Elms

## 2022-04-21 NOTE — Consult Note (Signed)
[facility] Nurse ostomy consult note Stoma type/location: RLQ, loop ileostomy  Stomal assessment/size: aprx. 1 3/4", budded, slightly oval shaped, distinct (2) os (left and right), red rubber support bridge in place  Peristomal assessment: NA Treatment options for stomal/peristomal skin: NA Output liquid green output Ostomy pouching: 2pc. 2 3/4" in place from the OR Education provided:  Explained role of ostomy nurse and creation of stoma  Explained stoma characteristics (budded, flush, color, texture, care)   Enrolled patient in [place] program: Yes  [place] Nurse will follow along with you for continued support with ostomy teaching and care [place] MSN, [place], [place], [place], [place]

## 2022-04-21 NOTE — Progress Notes (Signed)
Paged/called MD Berline Lopes in regards to patient's magnesium level being 0.9. Waiting on response. Patient is stable at this time.

## 2022-04-22 ENCOUNTER — Other Ambulatory Visit: Payer: Self-pay

## 2022-04-22 LAB — CBC
HCT: 30.2 % — ABNORMAL LOW (ref 36.0–46.0)
Hemoglobin: 9.5 g/dL — ABNORMAL LOW (ref 12.0–15.0)
MCH: 31.9 pg (ref 26.0–34.0)
MCHC: 31.5 g/dL (ref 30.0–36.0)
MCV: 101.3 fL — ABNORMAL HIGH (ref 80.0–100.0)
Platelets: 204 10*3/uL (ref 150–400)
RBC: 2.98 MIL/uL — ABNORMAL LOW (ref 3.87–5.11)
RDW: 22 % — ABNORMAL HIGH (ref 11.5–15.5)
WBC: 9.2 10*3/uL (ref 4.0–10.5)
nRBC: 0 % (ref 0.0–0.2)

## 2022-04-22 LAB — BASIC METABOLIC PANEL
Anion gap: 7 (ref 5–15)
BUN: 7 mg/dL — ABNORMAL LOW (ref 8–23)
CO2: 22 mmol/L (ref 22–32)
Calcium: 7.7 mg/dL — ABNORMAL LOW (ref 8.9–10.3)
Chloride: 109 mmol/L (ref 98–111)
Creatinine, Ser: 0.79 mg/dL (ref 0.44–1.00)
GFR, Estimated: >60 mL/min (ref >60)
Glucose, Bld: 125 mg/dL — ABNORMAL HIGH (ref 70–99)
Potassium: 4.3 mmol/L (ref 3.5–5.1)
Sodium: 138 mmol/L (ref 135–145)

## 2022-04-22 LAB — MAGNESIUM: Magnesium: 1.5 mg/dL — ABNORMAL LOW (ref 1.7–2.4)

## 2022-04-22 LAB — PHOSPHORUS: Phosphorus: 1.9 mg/dL — ABNORMAL LOW (ref 2.5–4.6)

## 2022-04-22 MED ORDER — TRAMADOL HCL 50 MG PO TABS
50.0000 mg | ORAL_TABLET | Freq: Four times a day (QID) | ORAL | Status: DC
  Administered 2022-04-22 – 2022-04-23 (×2): 50 mg via ORAL
  Administered 2022-04-23: 100 mg via ORAL
  Administered 2022-04-23 – 2022-04-25 (×7): 50 mg via ORAL
  Filled 2022-04-22 (×2): qty 1
  Filled 2022-04-22: qty 2
  Filled 2022-04-22 (×6): qty 1
  Filled 2022-04-22: qty 2
  Filled 2022-04-22: qty 1

## 2022-04-22 MED ORDER — SODIUM CHLORIDE 0.9% FLUSH
10.0000 mL | Freq: Two times a day (BID) | INTRAVENOUS | Status: DC
  Administered 2022-04-22 – 2022-04-25 (×7): 10 mL

## 2022-04-22 MED ORDER — MAGNESIUM OXIDE -MG SUPPLEMENT 400 (240 MG) MG PO TABS
200.0000 mg | ORAL_TABLET | Freq: Two times a day (BID) | ORAL | Status: DC
  Administered 2022-04-22 – 2022-04-25 (×7): 200 mg via ORAL
  Filled 2022-04-22 (×7): qty 1

## 2022-04-22 MED ORDER — K PHOS MONO-SOD PHOS DI & MONO 155-852-130 MG PO TABS
250.0000 mg | ORAL_TABLET | Freq: Every day | ORAL | Status: DC
  Administered 2022-04-22 – 2022-04-25 (×4): 250 mg via ORAL
  Filled 2022-04-22 (×4): qty 1

## 2022-04-22 MED ORDER — OXYCODONE HCL 5 MG PO TABS
5.0000 mg | ORAL_TABLET | ORAL | Status: DC | PRN
  Administered 2022-04-22 – 2022-04-24 (×4): 5 mg via ORAL
  Filled 2022-04-22 (×4): qty 1

## 2022-04-22 MED ORDER — SIMETHICONE 80 MG PO CHEW
80.0000 mg | CHEWABLE_TABLET | Freq: Three times a day (TID) | ORAL | Status: DC
  Administered 2022-04-22 – 2022-04-25 (×9): 80 mg via ORAL
  Filled 2022-04-22 (×9): qty 1

## 2022-04-22 MED ORDER — TRAMADOL HCL 50 MG PO TABS: 50.0000 mg | ORAL_TABLET | Freq: Four times a day (QID) | ORAL | Status: DC | PRN

## 2022-04-22 MED ORDER — RIVAROXABAN 10 MG PO TABS: 10.0000 mg | ORAL_TABLET | Freq: Every day | ORAL | 0 refills | Status: DC

## 2022-04-22 MED ORDER — ORAL CARE MOUTH RINSE: 15.0000 mL | OROMUCOSAL | Status: DC | PRN

## 2022-04-22 MED ORDER — PANTOPRAZOLE SODIUM 40 MG PO TBEC
40.0000 mg | DELAYED_RELEASE_TABLET | Freq: Every day | ORAL | Status: DC
  Administered 2022-04-22 – 2022-04-24 (×3): 40 mg via ORAL
  Filled 2022-04-22 (×3): qty 1

## 2022-04-22 MED ORDER — TRAMADOL HCL 50 MG PO TABS: 50.0000 mg | ORAL_TABLET | Freq: Four times a day (QID) | ORAL | Status: DC

## 2022-04-22 NOTE — Consult Note (Addendum)
[facility] Nurse ostomy follow-up:   Stoma type/location: RLQ, loop ileostomy  Stomal assessment/size: 1 3/4", budded, slightly oval shaped, distinct (2) os (left and right), red rubber support bridge in place  Peristomal assessment: intact Treatment options for stomal/peristomal skin:2" barrier ring  Output 50 ccs liquid green output Ostomy pouching: 2pc. 2 3/4"  Education provided:  Explained role of ostomy nurse and creation of stoma  Explained stoma characteristics (budded, flush, color, texture, care) Patient and daughter engaged in education. Discussed with patient that ideally pouching system will be changed twice a week but the importance of changing pouch immediately if any leakage to prevent skin damage.   Patient is able to utilize lock and roll mechanism to empty current pouch.  Educated on the importance of emptying when 1/3 to 1/2 full.  Demonstrated to patient and daughter how to use the push and pull technique to remove current appliance and  clean around stoma with water moistened washcloth.  Discussed that the stoma is insensate and will not be harmed by cleaning.  Also discussed that some bleeding when cleaning stoma is normal.  Demonstrated to patient and daughter how to cut new skin barrier and apply barrier ring to stoma.  Demonstrated how to place pouch on skin barrier and secure.  Overall patient feels encouraged that she will be able to learn to care for her ostomy.    Enrolled patient in [place] program: Yes   [place] Nurse will follow along with you for continued support with ostomy teaching and care  Thank you,    Cobie Marcoux MSN, RN-BC, [employer]

## 2022-04-22 NOTE — Progress Notes (Signed)
2 Days Post-Op Procedure(s) (LRB): LAPAROSCOPY DIAGNOSTIC,CYSTO (N/A) HYSTERECTOMY ABDOMINAL WITH BILATERAL SALPINGO-OOPHORECTOMY (Bilateral) TUMOR DEBULKING, OMENTECTOMY (N/A) BOWEL RESECTION; DIVERTING ILEOSTOMY (N/A)  Subjective: Patient reports having moderate abdominal pain this am. Sat in the chair for "too long" yesterday. Ambulating to the bathroom without difficulty. Had one episode of dizziness while in bed yesterday but none when out of bed. Has had flatus and liquid output in the bag. No chest pain or dyspnea. No nausea or emesis reported. Tolerating sips of clear liquids.  No concerns voiced.   Objective: Vital signs in last 24 hours: Temp:  [97.9 F (36.6 C)-99.1 F (37.3 C)] 99.1 F (37.3 C) (01/12 0400) Pulse Rate:  [65-84] 79 (01/12 0700) Resp:  [13-23] 23 (01/12 0700) BP: (90-134)/(49-73) 112/65 (01/12 0700) SpO2:  [86 %-100 %] 100 % (01/12 0700)    Intake/Output from previous day: 01/11 0701 - 01/12 0700 In: 2179.5 [P.O.:120; I.V.:1879.2; IV Piggyback:180.3] Out: 1805 [Urine:1250; Drains:345; Stool:210]  Physical Examination: General: alert, cooperative, and no distress Resp: clear to auscultation bilaterally Cardio: regular rate and rhythm, S1, S2 normal, no murmur, click, rub or gallop GI: incision: midline abdominal incision with op site dressing in place stained but dry no drainage noted underneath and abdomen soft, active bowel sounds, ileostomy with liquid brown output, stoma pink, JP drain with serosanguinous drainage Extremities: extremities normal, atraumatic, no cyanosis or edema  Labs: WBC/Hgb/Hct/Plts:  9.2/9.5/30.2/204 (01/12 0344) BUN/Cr/glu/ALT/AST/amyl/lip:  7/0.79/--/--/--/--/-- (01/12 0344)  Assessment: 72 y.o. s/p Procedure(s): LAPAROSCOPY DIAGNOSTIC,CYSTO, HYSTERECTOMY ABDOMINAL WITH BILATERAL SALPINGO-OOPHORECTOMY, TUMOR DEBULKING, OMENTECTOMY, BOWEL RESECTION; DIVERTING ILEOSTOMY: stable Pain:  Pain is not well-controlled on PRN  medications. Plan to transition to oral meds. Heating pad ordered.  Heme: Hgb 9.5 from 10.5 yesterday am and Hct 30.2 from 31.6. Surgical EBL 1000 cc. Given 2 units PRBCs and albumin intra-op. Continue to monitor.   ID: WBC 9.2 from 16.1 yesterday am. Given cefoxitin intra-op along with decadron. No evidence of infection at this time. Continue to monitor.  CV: BP and HR stable. Continue to monitor. HR in 70-80s. On cardiac monitoring. Hx HTN-norvasc ordered.  GI:  Tolerating po: yes, sips of clears. NG placed intra-operatively removed on 1/11 after clamping trial. Ileostomy with liquid output and flatus.  GU: Voiding adequate amounts. Creatinine 0.79 from 0.88 yesterday am.  FEN: Magnesium 1.5 this am. Phosphorus 1.9 this am.    Magnesium returned as critical at 0.9 on 1/11-replaced.  Endo: No known hx of diabetes. Glucose on am Bmet 214 on 1/11. This am, glucose decreased to 125. Continue to monitor.  Prophylaxis: SCDs. Lovenox ordered  Plan: -Replace mag and phos -Transition to oral pain meds -Diet advancement -Encourage IS use, increasing mobility -Ostomy RN to see patient -Am labs -Continue plan of care per Dr. Berline Lopes    LOS: 2 days    Maria Romero 04/22/2022, 8:24 AM

## 2022-04-22 NOTE — Discharge Instructions (Addendum)
AFTER SURGERY INSTRUCTIONS   Return to work: 4-6 weeks if applicable  You may have a white honeycomb dressing over your larger incision. This dressing can be removed 5 days after surgery and you do not need to reapply a new dressing. Once you remove the dressing, you will notice that you have the surgical glue (dermabond) on the incision and this will peel off on its own. You can get this dressing wet in the shower the days after surgery prior to removal on the 5th day.   You are being prescribed a blood thinner (xarelto) after surgery for a total of 4 weeks to prevent blood clots. You will need to take this once a day at the same time each day. You will start this the day after discharge home if you received the blood thinner injection in the hospital on the day you were sent home. Avoid use of ibuprofen and other NSAIDs after surgery since you are on a blood thinner.   Activity: 1. Be up and out of the bed during the day.  Take a nap if needed.  You may walk up steps but be careful and use the hand rail.  Stair climbing will tire you more than you think, you may need to stop part way and rest.    2. No lifting or straining for 6 weeks over 10 pounds. No pushing, pulling, straining for 6 weeks.   3. No driving for 5 -10 days until cleared to drive.  Do not drive if you are taking narcotic pain medicine and make sure that your reaction time has returned.    4. You can shower as soon as the next day after surgery. Shower daily.  Use your regular soap and water (not directly on the incision) and pat your incision(s) dry afterwards; don't rub.  No tub baths or submerging your body in water until cleared by your surgeon. If you have the soap that was given to you by pre-surgical testing that was used before surgery, you do not need to use it afterwards because this can irritate your incisions.    5. No sexual activity and nothing in the vagina for 8-10 weeks.   6. You may experience a small amount of  clear drainage from your incision, which is normal.  If the drainage persists, increases, or changes color please call the office.   7. Do not use creams, lotions, or ointments such as neosporin on your incision after surgery until advised by your surgeon because they can cause removal of the dermabond glue on your incisions.     8. You may experience vaginal spotting after surgery or around the 6-8 week mark from surgery when the stitches at the top of the vagina begin to dissolve.  The spotting is normal but if you experience heavy bleeding, call our office.   9. Take Tylenol first for pain if you are able to take these medications and only use narcotic pain medication for severe pain not relieved by the Tylenol.  Monitor your Tylenol intake to a max of 4,000 mg in a 24 hour period.    Diet: 1. Low sodium Heart Healthy Diet is recommended but you are cleared to resume your normal (before surgery) diet after your procedure.   2. It is safe to use a laxative, such as Miralax or Colace, if you have difficulty moving your bowels. You have been prescribed Sennakot-S to take at bedtime every evening after surgery to keep bowel movements regular and  to prevent constipation.     Wound Care: 1. Keep clean and dry.  Shower daily.   Reasons to call the Doctor: Fever - Oral temperature greater than 100.4 degrees Fahrenheit Foul-smelling vaginal discharge Difficulty urinating Nausea and vomiting Increased pain at the site of the incision that is unrelieved with pain medicine. Difficulty breathing with or without chest pain New calf pain especially if only on one side Sudden, continuing increased vaginal bleeding with or without clots.   Contacts: For questions or concerns you should contact:   Dr. Jeral Pinch at [phone]   Joylene John, NP at [phone]   After Hours: call [phone] and have the GYN Oncologist paged/contacted (after 5 pm or on the weekends).   Messages sent via  mychart are for non-urgent matters and are not responded to after hours so for urgent needs, please call the after hours number.

## 2022-04-22 NOTE — Progress Notes (Signed)
Dr. Ernestina Patches will be covering for Dr. Berline Lopes tonight and trough the weekend. Her cell is [phone]. Roderick Pee

## 2022-04-22 NOTE — Progress Notes (Signed)
Gynecologic Oncology Progress Note  Patient reports having moderate abdominal pain at this time. States she has not received any pain meds since being on the floor. She ate chicken soup and coffee earlier today. No nausea or emesis reported but feels her abdomen gurgling and has intermittent bloating. We discussed having scheduled pain medication for the next day or two. Advised she can refuse this if not needed. Simethicone ordered. Diet advanced. All questions answered. Advised to call RN for any needs or concerns.

## 2022-04-23 LAB — CBC
HCT: 28 % — ABNORMAL LOW (ref 36.0–46.0)
Hemoglobin: 8.9 g/dL — ABNORMAL LOW (ref 12.0–15.0)
MCH: 31.3 pg (ref 26.0–34.0)
MCHC: 31.8 g/dL (ref 30.0–36.0)
MCV: 98.6 fL (ref 80.0–100.0)
Platelets: 179 10*3/uL (ref 150–400)
RBC: 2.84 MIL/uL — ABNORMAL LOW (ref 3.87–5.11)
RDW: 20.6 % — ABNORMAL HIGH (ref 11.5–15.5)
WBC: 7 10*3/uL (ref 4.0–10.5)
nRBC: 0 % (ref 0.0–0.2)

## 2022-04-23 LAB — BASIC METABOLIC PANEL
Anion gap: 3 — ABNORMAL LOW (ref 5–15)
BUN: 7 mg/dL — ABNORMAL LOW (ref 8–23)
CO2: 23 mmol/L (ref 22–32)
Calcium: 7.5 mg/dL — ABNORMAL LOW (ref 8.9–10.3)
Chloride: 105 mmol/L (ref 98–111)
Creatinine, Ser: 0.79 mg/dL (ref 0.44–1.00)
GFR, Estimated: >60 mL/min (ref >60)
Glucose, Bld: 96 mg/dL (ref 70–99)
Potassium: 3.6 mmol/L (ref 3.5–5.1)
Sodium: 131 mmol/L — ABNORMAL LOW (ref 135–145)

## 2022-04-23 LAB — MAGNESIUM: Magnesium: 1.2 mg/dL — ABNORMAL LOW (ref 1.7–2.4)

## 2022-04-23 LAB — PHOSPHORUS: Phosphorus: 2.8 mg/dL (ref 2.5–4.6)

## 2022-04-23 MED ORDER — MAGNESIUM SULFATE 2 GM/50ML IV SOLN
2.0000 g | Freq: Once | INTRAVENOUS | Status: AC
  Administered 2022-04-23: 2 g via INTRAVENOUS
  Filled 2022-04-23: qty 50

## 2022-04-23 MED ORDER — CALCIUM CARBONATE ANTACID 500 MG PO CHEW
400.0000 mg | CHEWABLE_TABLET | Freq: Two times a day (BID) | ORAL | Status: AC
  Administered 2022-04-23 (×2): 400 mg via ORAL
  Filled 2022-04-23 (×2): qty 2

## 2022-04-23 NOTE — Progress Notes (Signed)
3 Days Post-Op Procedure(s) (LRB): LAPAROSCOPY DIAGNOSTIC,CYSTO (N/A) HYSTERECTOMY ABDOMINAL WITH BILATERAL SALPINGO-OOPHORECTOMY (Bilateral) TUMOR DEBULKING, OMENTECTOMY (N/A) BOWEL RESECTION; DIVERTING ILEOSTOMY (N/A)  Subjective: Patient reports that yesterday was much improved. She is still having pain but it is improved with scheduling the tramadol. She was made sleepy by '100mg'$  prior to bed last night but reports that she slept well. She tolerating her breakfast this morning without nausea. She is ambulating in room and voiding without issue. Reports liquid stool and gas in ostomy bag. Reports one episode of dizziness yesterday but not today.   Objective: Vital signs in last 24 hours: Temp:  [97.8 F (36.6 C)-99 F (37.2 C)] 97.8 F (36.6 C) (01/13 0558) Pulse Rate:  [64-85] 64 (01/13 0558) Resp:  [16-23] 17 (01/13 0558) BP: (96-130)/(53-81) 106/71 (01/13 0558) SpO2:  [94 %-100 %] 99 % (01/13 0558) Last BM Date : 04/22/22  Intake/Output from previous day: 01/12 0701 - 01/13 0700 In: 1290.7 [P.O.:600; I.V.:690.7] Out: 2340 [Urine:1300; Drains:215; Stool:825]  Physical Examination: General: alert, cooperative, and no distress Resp: clear to auscultation bilaterally Cardio: regular rate and rhythm, S1, S2 normal, no murmur, click, rub or gallop GI: incision: midline abdominal incision with op site dressing in place stained but dry no drainage noted underneath and abdomen soft, active bowel sounds, ileostomy with liquid brown output, stoma pink, JP drain with serous drainage Extremities: extremities normal, atraumatic, no cyanosis or edema  Labs: WBC/Hgb/Hct/Plts:  7.0/8.9/28.0/179 (01/13 0530) BUN/Cr/glu/ALT/AST/amyl/lip:  7/0.79/--/--/--/--/-- (01/13 0530)  Assessment: 72 y.o. s/p Procedure(s): LAPAROSCOPY DIAGNOSTIC,CYSTO, HYSTERECTOMY ABDOMINAL WITH BILATERAL SALPINGO-OOPHORECTOMY, TUMOR DEBULKING, OMENTECTOMY, BOWEL RESECTION; DIVERTING ILEOSTOMY: stable Pain:  Pain is  improved with change to scheduled tramadol. Taking oxy additionally as needed for breakthrough. Continue.  Heme: Hgb to 8.9 today but excellent urine output and normal vitals. Drain purely serous output at this. Surgical EBL 1000 cc. Given 2 units PRBCs and albumin intra-op. Suspect she is nadiring. Continue to monitor.   ID: WBC continues to downtrend to 7.0 today. Given cefoxitin intra-op along with decadron. No evidence of infection at this time. Continue to monitor.  CV: BP and HR stable. Continue to monitor. On cardiac monitoring. Hx HTN-norvasc ordered.  GI:  Tolerating regular diet this morning. NG placed intra-operatively removed on 1/11 after clamping trial. Ileostomy with liquid output and flatus. Will monitor ileostomy output. If volume continues to increase, will add anti-motility agents if needed.  GU: Voiding adequate amounts. Creatinine nml, stable.  FEN: Magnesium 1.2, Calcium 7.5 and sodium 131 this am.  Magnesium returned as critical at 0.9 on 1/11-replaced.  Endo: No known hx of diabetes. Glucose on am Bmet 214 on 1/11. This am, glucose decreased to 96. Continue to monitor.  Prophylaxis: SCDs. Lovenox ordered  Plan: -Replace mag and calcium. Will monitor sodium as she is asymptomatic and sodium still >130.  -Continue pain regimen -Continue regular diet -Encourage IS use, increasing mobility -Ostomy RN to see patient -Continue AM labs    LOS: 3 days    Chau Savell 04/23/2022, 7:35 AM

## 2022-04-23 NOTE — TOC Progression Note (Signed)
Transition of Care [facility]) - Progression Note    Patient Details  Name: Maria Romero MRN: [id number] Date of Birth: 1950-10-23  Transition of [facility]) CM/SW Contact  Rodney Booze, [place] Phone Number: 04/23/2022, 4:15 PM  Clinical Narrative:    CSW has set up home health RN for this patient with [facility]. This CSW has reached out to the daughter to make her aware of the [facility] being set up. TOC will continue to follow for DC.      Expected Discharge Plan and Services                                               Social Determinants of Health (SDOH) Interventions SDOH Screenings   Food Insecurity: No Food Insecurity (12/15/2021)  Housing: Low Risk  (12/15/2021)  Transportation Needs: No Transportation Needs (12/15/2021)  Utilities: Not At Risk (12/15/2021)  Alcohol Screen: Low Risk  (03/26/2021)  Depression (PHQ2-9): Low Risk  (11/15/2021)  Financial Resource Strain: Low Risk  (03/26/2021)  Physical Activity: Inactive (03/26/2021)  Social Connections: Socially Isolated (03/26/2021)  Stress: No Stress Concern Present (03/26/2021)  Tobacco Use: Medium Risk (04/21/2022)    Readmission Risk Interventions    01/02/2022    2:09 PM  Readmission Risk Prevention Plan  Transportation Screening Complete  PCP or Specialist Appt within 3-5 Days Complete  HRI or [place] Complete  Social Work Consult for [place] Planning/Counseling Complete  Palliative Care Screening Complete  Medication Review [employer] photographer) Complete

## 2022-04-24 LAB — BASIC METABOLIC PANEL
Anion gap: 7 (ref 5–15)
BUN: 9 mg/dL (ref 8–23)
CO2: 24 mmol/L (ref 22–32)
Calcium: 8.1 mg/dL — ABNORMAL LOW (ref 8.9–10.3)
Chloride: 103 mmol/L (ref 98–111)
Creatinine, Ser: 0.82 mg/dL (ref 0.44–1.00)
GFR, Estimated: >60 mL/min (ref >60)
Glucose, Bld: 99 mg/dL (ref 70–99)
Potassium: 3.6 mmol/L (ref 3.5–5.1)
Sodium: 134 mmol/L — ABNORMAL LOW (ref 135–145)

## 2022-04-24 LAB — MAGNESIUM: Magnesium: 1.9 mg/dL (ref 1.7–2.4)

## 2022-04-24 LAB — CBC
HCT: 28.1 % — ABNORMAL LOW (ref 36.0–46.0)
Hemoglobin: 9 g/dL — ABNORMAL LOW (ref 12.0–15.0)
MCH: 31.8 pg (ref 26.0–34.0)
MCHC: 32 g/dL (ref 30.0–36.0)
MCV: 99.3 fL (ref 80.0–100.0)
Platelets: 215 10*3/uL (ref 150–400)
RBC: 2.83 MIL/uL — ABNORMAL LOW (ref 3.87–5.11)
RDW: 19.7 % — ABNORMAL HIGH (ref 11.5–15.5)
WBC: 5.8 10*3/uL (ref 4.0–10.5)
nRBC: 0 % (ref 0.0–0.2)

## 2022-04-24 LAB — PHOSPHORUS: Phosphorus: 3.1 mg/dL (ref 2.5–4.6)

## 2022-04-24 NOTE — Progress Notes (Signed)
4 Days Post-Op Procedure(s) (LRB): LAPAROSCOPY DIAGNOSTIC,CYSTO (N/A) HYSTERECTOMY ABDOMINAL WITH BILATERAL SALPINGO-OOPHORECTOMY (Bilateral) TUMOR DEBULKING, OMENTECTOMY (N/A) BOWEL RESECTION; DIVERTING ILEOSTOMY (N/A)  Subjective: Patient reports that she had a good day yesterday. Ate well without issue. Denies nausea. Continues to have output and gas in ostomy bag. Reports that she helped drain it some but still feels that she needs some practice with managing the ostomy independently. She is ambulating and voiding without issue. Reports that she still has strong pain when trying to sit up or get out of bed but doesn't want more/stronger pain medications. She reports that at rest and once up, her pain is well controlled.   Objective: Vital signs in last 24 hours: Temp:  [97.4 F (36.3 C)-98.2 F (36.8 C)] 97.5 F (36.4 C) (01/14 0605) Pulse Rate:  [61-69] 61 (01/14 0605) Resp:  [16-18] 18 (01/14 0605) BP: (109-118)/(60-69) 117/68 (01/14 0605) SpO2:  [94 %-98 %] 98 % (01/14 0605) Last BM Date : 04/24/22  Intake/Output from previous day: 01/13 0701 - 01/14 0700 In: 230 [P.O.:220; I.V.:10] Out: 1455 [Urine:440; Drains:120; Stool:895]  Physical Examination: General: alert, cooperative, and no distress Resp: clear to auscultation bilaterally Cardio: regular rate and rhythm, S1, S2 normal, no murmur, click, rub or gallop GI: incision: midline abdominal incision with op site dressing in place stained but dry no drainage noted underneath and abdomen soft, active bowel sounds, ileostomy with liquid and soft brown output, stoma pink, JP drain with serous drainage Extremities: extremities normal, atraumatic, no cyanosis or edema  Labs: WBC/Hgb/Hct/Plts:  5.8/9.0/28.1/215 (01/14 0500) BUN/Cr/glu/ALT/AST/amyl/lip:  9/0.82/--/--/--/--/-- (01/14 0500)  Assessment: 72 y.o. s/p Procedure(s): LAPAROSCOPY DIAGNOSTIC,CYSTO, HYSTERECTOMY ABDOMINAL WITH BILATERAL SALPINGO-OOPHORECTOMY, TUMOR  DEBULKING, OMENTECTOMY, BOWEL RESECTION; DIVERTING ILEOSTOMY: stable  Pain:  Pain is improved with change to scheduled tramadol. Taking oxy additionally as needed for breakthrough. Does not want any increase in pain regimen. Continue.  Heme: Hgb to 9.0 today (from 8.9 yesterday). Drain purely serous output at this. Surgical EBL 1000 cc. Given 2 units PRBCs and albumin intra-op. CBC stabilized. Will discontinue checks given stable, normal vitals and excellent UOP.  ID: WBC normal at 5.8. Given cefoxitin intra-op along with decadron.   CV: BP and HR stable. Continue to monitor. On cardiac monitoring. Hx HTN-norvasc ordered.  GI:  Tolerating regular diet. NG placed intra-operatively removed on 1/11 after clamping trial. Ileostomy with liquid and soft output and flatus. Output is stable and <1L. Continue to monitor output volume.  GU: Voiding adequate amounts. Creatinine nml, stable.  FEN: Electrolytes improved/normal this morning after repletion. Continue PO mag ox supplementation. Sodium improved to 134. Continue daily Chem10  Endo: No known hx of diabetes. Glucose on am Bmet 214 on 1/11. This am, glucose wnl at 99. Continue to monitor.  Prophylaxis: SCDs. Lovenox ordered  Plan: -Continue to monitor ileostomy output and electrolytes with chem10 and replace as indicated. -Pt encouraged to work on independent management of ostomy today and visit with ostomy nurse tomorrow to prepare for discharge home -Continue pain regimen -Continue regular diet -Encourage IS use, increasing mobility -Will remove dressing tomorrow (POD#5). Will arrange for staple removal appointment. -Likely discharge home tomorrow.    LOS: 4 days    Maria Romero 04/24/2022, 10:53 AM

## 2022-04-25 ENCOUNTER — Encounter: Payer: Self-pay | Admitting: *Deleted

## 2022-04-25 ENCOUNTER — Other Ambulatory Visit: Payer: Self-pay | Admitting: Gynecologic Oncology

## 2022-04-25 ENCOUNTER — Telehealth: Payer: Self-pay | Admitting: *Deleted

## 2022-04-25 DIAGNOSIS — Z932 Ileostomy status: Secondary | ICD-10-CM

## 2022-04-25 LAB — BASIC METABOLIC PANEL
Anion gap: 4 — ABNORMAL LOW (ref 5–15)
Anion gap: 9 (ref 5–15)
BUN: 7 mg/dL — ABNORMAL LOW (ref 8–23)
BUN: 9 mg/dL (ref 8–23)
CO2: 21 mmol/L — ABNORMAL LOW (ref 22–32)
CO2: 25 mmol/L (ref 22–32)
Calcium: 6.4 mg/dL — CL (ref 8.9–10.3)
Calcium: 8.6 mg/dL — ABNORMAL LOW (ref 8.9–10.3)
Chloride: 113 mmol/L — ABNORMAL HIGH (ref 98–111)
Chloride: 102 mmol/L (ref 98–111)
Creatinine, Ser: 0.54 mg/dL (ref 0.44–1.00)
Creatinine, Ser: 0.78 mg/dL (ref 0.44–1.00)
GFR, Estimated: >60 mL/min (ref >60)
GFR, Estimated: >60 mL/min (ref >60)
Glucose, Bld: 69 mg/dL — ABNORMAL LOW (ref 70–99)
Glucose, Bld: 90 mg/dL (ref 70–99)
Potassium: 3.1 mmol/L — ABNORMAL LOW (ref 3.5–5.1)
Potassium: 4.1 mmol/L (ref 3.5–5.1)
Sodium: 138 mmol/L (ref 135–145)
Sodium: 136 mmol/L (ref 135–145)

## 2022-04-25 LAB — PHOSPHORUS: Phosphorus: 3 mg/dL (ref 2.5–4.6)

## 2022-04-25 LAB — MAGNESIUM
Magnesium: 1 mg/dL — ABNORMAL LOW (ref 1.7–2.4)
Magnesium: 1.2 mg/dL — ABNORMAL LOW (ref 1.7–2.4)

## 2022-04-25 MED ORDER — RIVAROXABAN 10 MG PO TABS: 10.0000 mg | ORAL_TABLET | Freq: Every day | ORAL | 0 refills | Status: DC

## 2022-04-25 MED ORDER — HEPARIN SOD (PORK) LOCK FLUSH 100 UNIT/ML IV SOLN
500.0000 [IU] | INTRAVENOUS | Status: AC | PRN
  Administered 2022-04-25: 500 [IU]

## 2022-04-25 MED ORDER — MAGNESIUM OXIDE -MG SUPPLEMENT 400 (240 MG) MG PO TABS: 200.0000 mg | ORAL_TABLET | Freq: Every day | ORAL | 3 refills | Status: DC

## 2022-04-25 NOTE — Progress Notes (Signed)
Mobility Specialist - Progress Note   04/25/22 0941  Mobility  Activity Ambulated with assistance in hallway  Level of Assistance Modified independent, requires aide device or extra time  Assistive Device None  Distance Ambulated (ft) 350 ft  Activity Response Tolerated well  Mobility Referral Yes  $Mobility charge 1 Mobility   Pt received in bed and agreed to mobility, some pain in incision site with bed mobility. Pt had no c/o pain during ambulation. Pt returned to bed with all needs met.   Roderick Pee Mobility Specialist

## 2022-04-25 NOTE — Consult Note (Signed)
WOC Nurse ostomy follow-up:   Stoma type/location: RLQ, loop ileostomy  Stomal assessment/size: 1 3/4", budded, slightly oval shaped, distinct (2) os (left and right), red rubber support bridge in place  Peristomal assessment: intact Treatment options for stomal/peristomal skin:2" barrier ring  Output 250 ccs liquid green output Ostomy pouching: 2pc. 2 3/4"  Education provided:  Explained stoma characteristics (budded, flush, color, texture, care) Patient is very motivated to learn as expecting to be discharged soon. Patient states she has been emptying her own pouch and cleaning the spout after emptying.  We did discuss how to burp the bag on this visit.   Discussed with patient that ideally pouching system will be changed twice a week but the importance of changing pouch immediately if any leakage to prevent skin damage. Re-educated on the importance of emptying when 1/3 to 1/2 full.  Patient was able to cut out a new skin barrier, she removed the old pouching appliance and cleaned the skin with water moistened washcloth.  Patient assisted with placing new barrier ring and applying the new skin barrier.  Patient did have some difficulty with snapping new pouch onto skin barrier and did require assistance with this.  Overall patient made good progress with ostomy change but is not completely independent.    Patient would benefit from a home health nurse and referral to ostomy clinic at discharge to assist with management of her new ostomy.  Patient does live alone.   We discussed at this visit normal output for an ileostomy, what to do in case of diarrhea and prevention of dehydration.  Also discussed food blockage and foods to be aware of that may be more problematic.  Discussed the importance of eating slowly and chewing food completely.      Enrolled patient in [place] program: Yes   [place] Nurse will follow along with you for continued support with ostomy teaching and  care  Thank you,    Minahil Quinlivan MSN, RN-BC, [employer]

## 2022-04-25 NOTE — Progress Notes (Signed)
5 Days Post-Op Procedure(s) (LRB): LAPAROSCOPY DIAGNOSTIC,CYSTO (N/A) HYSTERECTOMY ABDOMINAL WITH BILATERAL SALPINGO-OOPHORECTOMY (Bilateral) TUMOR DEBULKING, OMENTECTOMY (N/A) BOWEL RESECTION; DIVERTING ILEOSTOMY (N/A)  Subjective: Patient reports doing well this am. Had a sandwich for lunch with no nausea or emesis. Continues to have output and gas in ostomy bag. She has been working with emptying the bag and assisted with a bag change today with the ostomy RN. She is ambulating and voiding without issue. Pain is manageable. No concerns voiced.  Objective: Vital signs in last 24 hours: Temp:  [97.7 F (36.5 C)-98.1 F (36.7 C)] 97.9 F (36.6 C) (01/15 0524) Pulse Rate:  [61-72] 61 (01/15 0524) Resp:  [17-18] 17 (01/15 0524) BP: (118-129)/(68-78) 119/68 (01/15 0524) SpO2:  [95 %-98 %] 95 % (01/15 0524) Last BM Date : 04/24/22  Intake/Output from previous day: 01/14 0701 - 01/15 0700 In: 120 [P.O.:120] Out: 1600 [Urine:570; Drains:20; Stool:1010]  Physical Examination: General: alert, cooperative, and no distress Resp: clear to auscultation bilaterally. Improved inspiratory volume Cardio: regular rate and rhythm, S1, S2 normal, no murmur, click, rub or gallop GI: incision: midline abdominal incision with op site dressing removed with staple, no drainage or erythema. Abdomen soft, active bowel sounds, ileostomy with liquid brown output, stoma pink, JP drain with minimal amount of serous drainage Extremities: extremities normal, atraumatic, no cyanosis or edema Right chest port-a-cath accessed and capped.  Labs:   BUN/Cr/glu/ALT/AST/amyl/lip:  7/0.54/--/--/--/--/-- (01/15 0454)  Assessment: 72 y.o. s/p Procedure(s): LAPAROSCOPY DIAGNOSTIC,CYSTO, HYSTERECTOMY ABDOMINAL WITH BILATERAL SALPINGO-OOPHORECTOMY, TUMOR DEBULKING, OMENTECTOMY, BOWEL RESECTION; DIVERTING ILEOSTOMY: stable  Pain:  Pain is improved with change to scheduled tramadol. Taking oxy additionally as needed for  breakthrough. Does not want any increase in pain regimen. Continue.  Heme: Hgb to 9.0 on 1/14 (from 8.9 on 1/13). Drain purely serous output at this. Surgical EBL 1000 cc. Given 2 units PRBCs and albumin intra-op. CBC stabilized. Will discontinue checks given stable, normal vitals and excellent UOP.  ID: WBC normal at 5.8 on 04/24/22. Given cefoxitin intra-op along with decadron.   CV: BP and HR stable. Continue to monitor. On cardiac monitoring. Hx HTN-norvasc ordered.  GI:  Tolerating regular diet. NG placed intra-operatively removed on 1/11 after clamping trial. Ileostomy with liquid output and flatus. Output with around 1L on 1/14. Continue to monitor output volume.  GU: Voiding adequate amounts. Creatinine 0.54, stable.  FEN: Ca+ 6.4 this am. K+ at 3.1. Mag 1.0. Metabolic panel repeated. Continue PO mag ox supplementation.   Endo: No known hx of diabetes. Glucose on am Bmet 214 on 1/11.   Prophylaxis: SCDs. Lovenox ordered  Plan: -Patient doing well. Plan for discharge today   LOS: 5 days    Maria Romero 04/25/2022, 7:27 AM

## 2022-04-25 NOTE — Progress Notes (Signed)
Date and time results received: 04/25/22 0554   Test: Calcium Critical Value: 6.4  Name of Provider Notified: Dr. Ernestina Patches  Orders Received? Or Actions Taken?: Dr. Lenon Ahmadi that she will place an order.

## 2022-04-25 NOTE — Plan of Care (Signed)
  Problem: Skin Integrity: Goal: Risk for impaired skin integrity will decrease 04/25/2022 0829 by York Spaniel, RN Outcome: Progressing 04/25/2022 0829 by York Spaniel, RN Outcome: Progressing   Problem: Safety: Goal: Ability to remain free from injury will improve 04/25/2022 0829 by York Spaniel, RN Outcome: Progressing 04/25/2022 0829 by York Spaniel, RN Outcome: Progressing   Problem: Pain Managment: Goal: General experience of comfort will improve 04/25/2022 0829 by York Spaniel, RN Outcome: Progressing 04/25/2022 0829 by Marylen Ponto D, RN Outcome: Progressing   Problem: Coping: Goal: Level of anxiety will decrease 04/25/2022 0829 by York Spaniel, RN Outcome: Progressing 04/25/2022 0829 by York Spaniel, RN Outcome: Progressing   Problem: Nutrition: Goal: Adequate nutrition will be maintained 04/25/2022 0829 by York Spaniel, RN Outcome: Progressing 04/25/2022 0829 by York Spaniel, RN Outcome: Progressing   Problem: Activity: Goal: Risk for activity intolerance will decrease 04/25/2022 0829 by York Spaniel, RN Outcome: Progressing 04/25/2022 0829 by York Spaniel, RN Outcome: Progressing   Problem: Clinical Measurements: Goal: Ability to maintain clinical measurements within normal limits will improve 04/25/2022 0829 by York Spaniel, RN Outcome: Progressing 04/25/2022 0829 by York Spaniel, RN Outcome: Progressing Goal: Will remain free from infection 04/25/2022 0829 by York Spaniel, RN Outcome: Progressing 04/25/2022 0829 by York Spaniel, RN Outcome: Progressing Goal: Diagnostic test results will improve 04/25/2022 0829 by York Spaniel, RN Outcome: Progressing 04/25/2022 0829 by York Spaniel, RN Outcome: Progressing Goal: Respiratory complications will improve 04/25/2022 0829 by York Spaniel, RN Outcome: Progressing 04/25/2022 0829 by York Spaniel, RN Outcome: Progressing Goal: Cardiovascular complication will be avoided 04/25/2022 0829 by York Spaniel, RN Outcome: Progressing 04/25/2022 0829 by York Spaniel, RN Outcome: Progressing   Problem: Health Behavior/Discharge Planning: Goal: Ability to manage health-related needs will improve 04/25/2022 0829 by York Spaniel, RN Outcome: Progressing 04/25/2022 0829 by York Spaniel, RN Outcome: Progressing   Problem: Skin Integrity: Goal: Demonstration of wound healing without infection will improve 04/25/2022 0829 by York Spaniel, RN Outcome: Progressing 04/25/2022 0829 by York Spaniel, RN Outcome: Progressing   Problem: Education: Goal: Knowledge of the prescribed therapeutic regimen will improve 04/25/2022 0829 by York Spaniel, RN Outcome: Progressing 04/25/2022 0829 by York Spaniel, RN Outcome: Progressing Goal: Understanding of sexual limitations or changes related to disease process or condition will improve 04/25/2022 0829 by York Spaniel, RN Outcome: Progressing 04/25/2022 0829 by York Spaniel, RN Outcome: Progressing Goal: Individualized Educational Video(s) 04/25/2022 0829 by York Spaniel, RN Outcome: Progressing 04/25/2022 0829 by York Spaniel, RN Outcome: Progressing

## 2022-04-25 NOTE — Telephone Encounter (Signed)
Per Dr Berline Lopes, patient scheduled for a lab  draw on 1/18 at [facility] and a staple removal on 1/23 at [facility]

## 2022-04-25 NOTE — Plan of Care (Signed)
Problem: Education: Goal: Knowledge of the prescribed therapeutic regimen will improve 04/25/2022 1426 by York Spaniel, RN Outcome: Adequate for Discharge 04/25/2022 0829 by York Spaniel, RN Outcome: Progressing 04/25/2022 0829 by York Spaniel, RN Outcome: Progressing Goal: Understanding of sexual limitations or changes related to disease process or condition will improve 04/25/2022 1426 by York Spaniel, RN Outcome: Adequate for Discharge 04/25/2022 0829 by York Spaniel, RN Outcome: Progressing 04/25/2022 0829 by York Spaniel, RN Outcome: Progressing Goal: Individualized Educational Video(s) 04/25/2022 1426 by York Spaniel, RN Outcome: Adequate for Discharge 04/25/2022 0829 by York Spaniel, RN Outcome: Progressing 04/25/2022 0829 by York Spaniel, RN Outcome: Progressing   Problem: Self-Concept: Goal: Communication of feelings regarding changes in body function or appearance will improve 04/25/2022 1426 by York Spaniel, RN Outcome: Adequate for Discharge 04/25/2022 0829 by York Spaniel, RN Outcome: Progressing 04/25/2022 0829 by York Spaniel, RN Outcome: Progressing   Problem: Skin Integrity: Goal: Demonstration of wound healing without infection will improve 04/25/2022 1426 by York Spaniel, RN Outcome: Adequate for Discharge 04/25/2022 0829 by York Spaniel, RN Outcome: Progressing 04/25/2022 0829 by York Spaniel, RN Outcome: Progressing   Problem: Education: Goal: Knowledge of General Education information will improve Description: Including pain rating scale, medication(s)/side effects and non-pharmacologic comfort measures 04/25/2022 1426 by York Spaniel, RN Outcome: Adequate for Discharge 04/25/2022 0829 by York Spaniel, RN Outcome: Progressing 04/25/2022 0829 by York Spaniel, RN Outcome: Progressing   Problem: Health Behavior/Discharge Planning: Goal:  Ability to manage health-related needs will improve 04/25/2022 1426 by York Spaniel, RN Outcome: Adequate for Discharge 04/25/2022 0829 by York Spaniel, RN Outcome: Progressing 04/25/2022 0829 by York Spaniel, RN Outcome: Progressing   Problem: Clinical Measurements: Goal: Ability to maintain clinical measurements within normal limits will improve 04/25/2022 1426 by York Spaniel, RN Outcome: Adequate for Discharge 04/25/2022 0829 by York Spaniel, RN Outcome: Progressing 04/25/2022 0829 by York Spaniel, RN Outcome: Progressing Goal: Will remain free from infection 04/25/2022 1426 by York Spaniel, RN Outcome: Adequate for Discharge 04/25/2022 0829 by York Spaniel, RN Outcome: Progressing 04/25/2022 0829 by York Spaniel, RN Outcome: Progressing Goal: Diagnostic test results will improve 04/25/2022 1426 by York Spaniel, RN Outcome: Adequate for Discharge 04/25/2022 0829 by York Spaniel, RN Outcome: Progressing 04/25/2022 0829 by York Spaniel, RN Outcome: Progressing Goal: Respiratory complications will improve 04/25/2022 1426 by York Spaniel, RN Outcome: Adequate for Discharge 04/25/2022 0829 by York Spaniel, RN Outcome: Progressing 04/25/2022 0829 by York Spaniel, RN Outcome: Progressing Goal: Cardiovascular complication will be avoided 04/25/2022 1426 by York Spaniel, RN Outcome: Adequate for Discharge 04/25/2022 0829 by York Spaniel, RN Outcome: Progressing 04/25/2022 0829 by York Spaniel, RN Outcome: Progressing   Problem: Coping: Goal: Level of anxiety will decrease 04/25/2022 1426 by York Spaniel, RN Outcome: Adequate for Discharge 04/25/2022 0829 by York Spaniel, RN Outcome: Progressing 04/25/2022 0829 by York Spaniel, RN Outcome: Progressing   Problem: Elimination: Goal: Will not experience complications related to bowel  motility 04/25/2022 1426 by York Spaniel, RN Outcome: Adequate for Discharge 04/25/2022 0829 by York Spaniel, RN Outcome: Progressing 04/25/2022 0829 by York Spaniel, RN Outcome: Progressing Goal: Will not experience complications related to urinary retention 04/25/2022 1426 by York Spaniel, RN Outcome: Adequate for Discharge 04/25/2022 0829 by York Spaniel, RN Outcome: Progressing 04/25/2022 0829 by  Marylen Ponto D, RN Outcome: Progressing   Problem: Safety: Goal: Ability to remain free from injury will improve 04/25/2022 1426 by York Spaniel, RN Outcome: Adequate for Discharge 04/25/2022 0829 by York Spaniel, RN Outcome: Progressing 04/25/2022 0829 by York Spaniel, RN Outcome: Progressing   Problem: Pain Managment: Goal: General experience of comfort will improve 04/25/2022 1426 by York Spaniel, RN Outcome: Adequate for Discharge 04/25/2022 0829 by York Spaniel, RN Outcome: Progressing 04/25/2022 0829 by York Spaniel, RN Outcome: Progressing

## 2022-04-25 NOTE — Care Management Important Message (Signed)
Important Message  Patient Details IM Letter given. Name: Maria Romero MRN: [id number] Date of Birth: 01-Jul-1950   Medicare Important Message Given:  Yes     Kerin Salen 04/25/2022, 10:25 AM

## 2022-04-25 NOTE — Discharge Summary (Signed)
Physician Discharge Summary  Patient ID: Maria Romero MRN: [id number] DOB/AGE: 01/07/1951 72 y.o.  Admit date: 04/20/2022 Discharge date: 04/25/2022  Admission Diagnoses: Ovarian cancer [facility])  Discharge Diagnoses:  Principal Problem:   Ovarian cancer [facility]) Active Problems:   Hypocalcemia   Discharged Condition:  The patient is in good condition and stable for discharge.    Hospital Course: On 04/20/2022, the patient underwent the following: Procedure(s): LAPAROSCOPY DIAGNOSTIC,CYSTO, HYSTERECTOMY ABDOMINAL WITH BILATERAL SALPINGO-OOPHORECTOMY, TUMOR DEBULKING, OMENTECTOMY BOWEL RESECTION; DIVERTING ILEOSTOMY. The postoperative course was uneventful.  She was discharged to home on postoperative day 5 tolerating a regular diet, ambulating without difficulty, voiding, pain managed with oral medications, caring for ileostomy. She is discharged home on prophylactic xarelto for a total of 4 weeks post-op.   Consults: Ostomy RN  Significant Diagnostic Studies: Labs  Treatments: Surgery see above  Discharge Exam (performed by Dr. Ernestina Patches): Blood pressure 112/72, pulse 73, temperature 97.9 F (36.6 C), temperature source Oral, resp. rate 18, height '5\' 3"'$  (1.6 m), weight 131 lb 9.8 oz (59.7 kg), SpO2 99 %. General appearance: alert, cooperative, and no distress Resp: clear to auscultation bilaterally Cardio: regular rate and rhythm, S1, S2 normal, no murmur, click, rub or gallop GI: soft, non-tender; bowel sounds normal; no masses,  no organomegaly Extremities: extremities normal, atraumatic, no cyanosis or edema Incision/Wound: Midline incision with staples, op site dressing removed, no erythema or drainage from incision. JP dressing intact-drain to be removed prior to discharge. Ileostomy with moderate amount of liquid stool in bag. Ileostomy bag is intact with no leakage Right chest port-a-cath accessed and capped  Disposition: Discharge disposition: 01-Home or Self  Care       Discharge Instructions     Call MD for:  difficulty breathing, headache or visual disturbances   Complete by: As directed    Call MD for:  extreme fatigue   Complete by: As directed    Call MD for:  hives   Complete by: As directed    Call MD for:  persistant dizziness or light-headedness   Complete by: As directed    Call MD for:  persistant nausea and vomiting   Complete by: As directed    Call MD for:  redness, tenderness, or signs of infection (pain, swelling, redness, odor or green/yellow discharge around incision site)   Complete by: As directed    Call MD for:  severe uncontrolled pain   Complete by: As directed    Call MD for:  temperature >100.4   Complete by: As directed    Diet - low sodium heart healthy   Complete by: As directed    Driving Restrictions   Complete by: As directed    No driving for around 2 week(s).  Do not take narcotics and drive. You need to make sure your reaction time has returned.   Increase activity slowly   Complete by: As directed    Lifting restrictions   Complete by: As directed    No lifting greater than 10 lbs, pushing, pulling, straining for 6 weeks.   Sexual Activity Restrictions   Complete by: As directed    No sexual activity, nothing in the vagina, for 8-10 weeks.      Allergies as of 04/25/2022       Reactions   Fluvastatin Sodium Other (See Comments)   Myalgias        Medication List     STOP taking these medications    amoxicillin-clavulanate 875-125 MG tablet Commonly known  as: AUGMENTIN   bisacodyl 5 MG EC tablet Commonly known as: DULCOLAX   metroNIDAZOLE 500 MG tablet Commonly known as: FLAGYL   polyethylene glycol powder 17 GM/SCOOP powder Commonly known as: MiraLax       TAKE these medications    acetaminophen 325 MG tablet Commonly known as: TYLENOL Take 650 mg by mouth as needed for moderate pain.   acyclovir 200 MG capsule Commonly known as: ZOVIRAX Take 1 capsule (200 mg  total) by mouth 3 (three) times daily.   amLODipine 5 MG tablet Commonly known as: NORVASC Take 5 mg by mouth daily.   atorvastatin 40 MG tablet Commonly known as: LIPITOR TAKE ONE TABLET BY MOUTH EVERY NIGHT AT BEDTIME   cholecalciferol 25 MCG (1000 UNIT) tablet Commonly known as: VITAMIN D3 Take 1,000 Units by mouth daily.   dexamethasone 4 MG tablet Commonly known as: DECADRON Take 2 tablets by mouth starting the day after chemotherapy for 3 days. Take with food.   lidocaine-prilocaine cream Commonly known as: EMLA Apply 1 Application topically as needed (prior to port access).   magic mouthwash (nystatin, lidocaine, diphenhydrAMINE, alum & mag hydroxide) suspension Swish and spit 5 mLs 4 (four) times daily as needed for mouth pain.   magnesium oxide 400 (240 Mg) MG tablet Commonly known as: MAG-OX Take 0.5 tablets (200 mg total) by mouth daily.   omeprazole 20 MG capsule Commonly known as: PRILOSEC Take 1 capsule (20 mg total) by mouth daily.   ondansetron 8 MG tablet Commonly known as: Zofran Take 1 tablet (8 mg total) by mouth every 8 (eight) hours as needed for nausea or vomiting. Start on the third day after chemotherapy.   prochlorperazine 10 MG tablet Commonly known as: COMPAZINE Take 1 tablet (10 mg total) by mouth every 6 (six) hours as needed for nausea or vomiting.   rivaroxaban 10 MG Tabs tablet Commonly known as: XARELTO Take 1 tablet (10 mg total) by mouth daily.   senna-docusate 8.6-50 MG tablet Commonly known as: Senokot-S Take 2 tablets by mouth at bedtime. For AFTER surgery, do not take if having diarrhea   traMADol 50 MG tablet Commonly known as: ULTRAM Take 1 tablet (50 mg total) by mouth every 6 (six) hours as needed for severe pain. For AFTER surgery only, do not take and drive   valsartan 106 MG tablet Commonly known as: DIOVAN Take 160 mg by mouth daily.   VIACTIV PO Take 1 tablet by mouth in the morning and at bedtime.         Follow-up Information     [name], MD Follow up on 04/27/2022.   Specialty: Gynecologic Oncology Why: at 4pm will be a PHONE visit to check in and discuss final pathology. IN PERSON visit will be on 05/19/2022 at [place] at the [facility]. Contact information: [place] [address] [phone]         [facility] Follow up on 04/28/2022.   Specialty: Oncology Why: at 3pm for labs Contact information: [place] [id number] [place] [address] [phone]        Joylene John D, NP Follow up on 05/03/2022.   Specialty: Gynecologic Oncology Why: at 2:30 pm at the [facility] for staple removal, post-op check in Contact information: [place] [address] [phone]                 Greater than thirty  minutes were spend for face to face discharge instructions and discharge orders/summary in EPIC.   Signed: [name] 04/25/2022, 2:17 PM

## 2022-04-25 NOTE — Plan of Care (Signed)

## 2022-04-26 ENCOUNTER — Encounter: Payer: Self-pay | Admitting: *Deleted

## 2022-04-26 ENCOUNTER — Telehealth: Payer: Self-pay | Admitting: *Deleted

## 2022-04-26 ENCOUNTER — Telehealth: Payer: Self-pay

## 2022-04-26 LAB — SURGICAL PATHOLOGY

## 2022-04-26 LAB — CALCIUM, IONIZED: Calcium, Ionized, Serum: 4.9 mg/dL (ref 4.5–5.6)

## 2022-04-26 NOTE — Telephone Encounter (Signed)
Spoke with Ms. Maria Romero this morning. She states she is eating, drinking and urinating well. She has  had a BM (pt has illiostomy which is draining well (the most drainage since being home is 500) she is passing gas. She is taking senokot as prescribed and encouraged her to drink plenty of water. She denies fever or chills. Incisions are dry and intact. She rates her pain 4-5/10. Her pain is controlled with Tramadol/Tylenol. She is having faint spotting on her mini pad/wiping x3 days which she states she know is normal.   Instructed to call office with any fever, chills, purulent drainage, uncontrolled pain or any other questions or concerns. Patient verbalizes understanding.   Pt aware of post op appointments as well as the office number [phone] and after hours number [phone] to call if she has any questions or concerns

## 2022-04-26 NOTE — Patient Outreach (Signed)
Care Coordination [facility] Note Transition Care Management Follow-up Telephone Call Date of discharge and from where: Monday, 04/25/22; Maria Romero; ovarian cancer with surgical abdominal hysterectomy/ bilateral salpingo oophorectomy/ bowel resection with diverting ileostomy How have you been since you were released from the hospital? "Overall things are going well, but this is all so new to me.  I am emptying the bag by myself but I have a lot of questions, and am looking forward to the home health nurse coming out to make sure I am doing everything right- they are supposed to call my daughter to set everything up.  My cousin is staying with me temporarily, I normally live by myself.  So far, I am able to do everything I need to on my own, but she is here if I need help, and my daughter is checking in daily and managing all of my medications- she has already set up my pill box and I am taking all of my medicine like they told me to.  Thank you for telling me about the DPR form, I need to fill that out and add my daughter to it" Any questions or concerns? Yes 1) new ileostomy- patient verbalizes questions due to newness of having ileostomy: verified that home health services were ordered at time of hospital discharge; provided contact information to patient for [facility] home health and provided my direct  number should she need assistance in initiating home health services; scheduled post-TOC follow up telephone visit with RN CM Care Coordinator for next week on 05/06/22  Items Reviewed: Did the pt receive and understand the discharge instructions provided? Yes  Medications obtained and verified? Yes - verified medication changes and new medications post-hospital discharge- confirmed patient has obtained and is taking newly prescribed medications; patient declines full medication review today as her daughter is currently handling all aspects of medication administration and is not present at time of TOC call;  encouraged patient to have her daughter contact me directly if medication questions/ concerns arise Other? No  Any new allergies since your discharge? No  Dietary orders reviewed? Yes Do you have support at home? Yes  patient reports she is independent in all aspects of self care; has regular assistance from family members as/ if needed/ indicated  Home Care and Equipment/Supplies: Were home health services ordered? yes If so, what is the name of the agency? [facility] [phone]  Has the agency set up a time to come to the patient's home? "Not sure if they have called my daughter yet, she is supposed to be here later and I will find out; if we have any problems, I will call you back" Were any new equipment or medical supplies ordered?  No What is the name of the medical supply agency? N/A Were you able to get the supplies/equipment? not applicable Do you have any questions related to the use of the equipment or supplies? No N/A  Functional Questionnaire: (I = Independent and D = Dependent) ADLs: I  Bathing/Dressing- I  Meal Prep- I  Eating- I  Maintaining continence- I  Transferring/Ambulation- I  Managing Meds- D  daughter handling all aspects of medication administration post- recent hospital discharge  Follow up appointments reviewed:  PCP Hospital f/u appt confirmed? No  Scheduled to see - on - @ [facility] f/u appt confirmed? Yes  Scheduled to see gynecological surgeon for virtual visit on tomorrow, 04/27/22 @ 4 pm- has in-person post-op surgical provider office visit scheduled on 05/03/22 for staple removal  and then again on 05/19/22 for in-person office visit- verified this is the recommended time frame per hospital discharge notes Are transportation arrangements needed? No  If their condition worsens, is the pt aware to call PCP or go to the Emergency Dept.? Yes Was the patient provided with contact information for the PCP's office or ED? No- patient declined;  reports already has contact information for all care providers Was to pt encouraged to call back with questions or concerns? Yes- provided my direct contact information should questions/ concerns/ needs arise prior to RN CM Care Coordinator outreach visit on 05/06/22  SDOH assessments and interventions completed:   Yes SDOH Interventions Today    Flowsheet Row Most Recent Value  SDOH Interventions   Food Insecurity Interventions Intervention Not Indicated  Transportation Interventions Intervention Not Indicated  [Family provides transportation]      Care Coordination Interventions:  Referred for Care Coordination Services:  RN Care Coordinator Provided extensive education around post-op instructions, initiation and role of home health services; importance/ value of and purpose of completing DPR form     Encounter Outcome:  Pt. Visit Completed    Oneta Rack, RN, BSN, CCRN Alumnus RN CM Care Coordination/ Transition of [place] Management [phone]: direct office

## 2022-04-27 ENCOUNTER — Encounter: Payer: Self-pay | Admitting: Hematology & Oncology

## 2022-04-27 ENCOUNTER — Inpatient Hospital Stay: Payer: Medicare Other | Attending: Hematology & Oncology | Admitting: Gynecologic Oncology

## 2022-04-27 ENCOUNTER — Encounter: Payer: Self-pay | Admitting: Gynecologic Oncology

## 2022-04-27 ENCOUNTER — Encounter: Payer: Self-pay | Admitting: *Deleted

## 2022-04-27 DIAGNOSIS — N179 Acute kidney failure, unspecified: Secondary | ICD-10-CM | POA: Insufficient documentation

## 2022-04-27 DIAGNOSIS — E876 Hypokalemia: Secondary | ICD-10-CM | POA: Insufficient documentation

## 2022-04-27 DIAGNOSIS — C569 Malignant neoplasm of unspecified ovary: Secondary | ICD-10-CM

## 2022-04-27 DIAGNOSIS — Z90722 Acquired absence of ovaries, bilateral: Secondary | ICD-10-CM | POA: Insufficient documentation

## 2022-04-27 DIAGNOSIS — R42 Dizziness and giddiness: Secondary | ICD-10-CM | POA: Insufficient documentation

## 2022-04-27 DIAGNOSIS — R110 Nausea: Secondary | ICD-10-CM | POA: Insufficient documentation

## 2022-04-27 DIAGNOSIS — Z7189 Other specified counseling: Secondary | ICD-10-CM

## 2022-04-27 DIAGNOSIS — E875 Hyperkalemia: Secondary | ICD-10-CM | POA: Insufficient documentation

## 2022-04-27 DIAGNOSIS — I9589 Other hypotension: Secondary | ICD-10-CM | POA: Insufficient documentation

## 2022-04-27 DIAGNOSIS — R197 Diarrhea, unspecified: Secondary | ICD-10-CM | POA: Insufficient documentation

## 2022-04-27 DIAGNOSIS — E861 Hypovolemia: Secondary | ICD-10-CM | POA: Insufficient documentation

## 2022-04-27 DIAGNOSIS — C561 Malignant neoplasm of right ovary: Secondary | ICD-10-CM | POA: Insufficient documentation

## 2022-04-27 DIAGNOSIS — Z932 Ileostomy status: Secondary | ICD-10-CM

## 2022-04-27 DIAGNOSIS — Z90710 Acquired absence of both cervix and uterus: Secondary | ICD-10-CM | POA: Insufficient documentation

## 2022-04-27 NOTE — Progress Notes (Signed)
Surgical path has resulted. Reviewed with Dr Marin Olp. No additional requests at this time. Patient needs to be seen in follow up in about 3 weeks. Message sent to scheduling.   Oncology Nurse Navigator Documentation     04/27/2022    9:00 AM  Oncology Nurse Navigator Flowsheets  Surgery Actual Start Date: 04/20/2022  Navigator Follow Up Date: 05/16/2022  Navigator Follow Up Reason: Follow-up Appointment  Navigator Location [facility]  Navigator Encounter Type Pathology Review  Patient Visit Type MedOnc  Treatment Phase Active Tx  Barriers/Navigation Needs Coordination of Care  Interventions Coordination of Care  Acuity Level 1-No Barriers  Coordination of Care Appts;Pathology  Support Groups/Services Friends and Family  Time Spent with Patient 15

## 2022-04-27 NOTE — Progress Notes (Signed)
Gynecologic Oncology Telehealth Note: Gyn-Onc  I connected with Maria Romero on 04/27/22 at  4:00 PM EST by telephone and verified that I am speaking with the correct person using two identifiers.  I discussed the limitations, risks, security and privacy concerns of performing an evaluation and management service by telemedicine and the availability of in-person appointments. I also discussed with the patient that there may be a patient responsible charge related to this service. The patient expressed understanding and agreed to proceed.  Other persons participating in the visit and their role in the encounter: none.  Patient's location: home Provider's location: [facility]  Reason for Visit: follow-up after surgery  Treatment History: Oncology History  [name], right ([place])  12/01/2021 Initial Diagnosis   [name], right ([name])   12/01/2021 Cancer Staging   Staging form: Ovary, Fallopian Tube, and Primary Peritoneal Carcinoma, AJCC 8th Edition - Clinical stage from 12/01/2021: FIGO Stage IIIC (cT3c, cN1b, cM0) - Signed by Volanda Napoleon, MD on 12/01/2021 Stage prefix: Initial diagnosis Histologic grade (G): G2 Histologic grading system: 4 grade system   12/07/2021 -  Chemotherapy   Patient is on Treatment Plan : OVARIAN Carboplatin (AUC 6) + Paclitaxel (175) q21d X 6 Cycles     01/11/2022 -  Chemotherapy   Patient is on Treatment Plan : OVARIAN Bevacizumab q21d      Genetic Testing   Negative genetic testing. No pathogenic variants identified on the [facility] Common Hereditary Cancers+RNA panel. The report date is 01/24/2022.  The Common Hereditary Cancers Panel + RNA offered by [facility] includes sequencing and/or deletion duplication testing of the following 47 genes: APC, ATM, AXIN2, BARD1, BMPR1A, BRCA1, BRCA2, BRIP1, CDH1, CDKN2A (p14ARF), CDKN2A (p16INK4a), CKD4, CHEK2, CTNNA1, DICER1, EPCAM (Deletion/duplication testing only), GREM1 (promoter region deletion/duplication testing  only), KIT, MEN1, MLH1, MSH2, MSH3, MSH6, MUTYH, NBN, NF1, NHTL1, PALB2, PDGFRA, PMS2, POLD1, POLE, PTEN, RAD50, RAD51C, RAD51D, SDHB, SDHC, SDHD, SMAD4, SMARCA4. STK11, TP53, TSC1, TSC2, and VHL.  The following genes were evaluated for sequence changes only: SDHA and HOXB13 c.251G>A variant only.     Interval History: Doing well. Pain continues to improve. Voiding without difficulty.  Had some spotting starting Saturday, today bleeding seems to have stopped. Measuring ostomy output, generally 2-7 ounces every time she empties the bag.  Discussed Imodium if she starts to have very high output.  Past Medical/Surgical History: Past Medical History:  Diagnosis Date   Anemia    Complication of anesthesia    Dyspnea    Goals of care, counseling/discussion 12/01/2021   Hypercholesterolemia    Hypertension    IBS (irritable bowel syndrome)    Malignant ascites 11/19/2021   Osteopenia    Osteoporosis    [name], right ([facility]) 12/01/2021   Ovarian mass, right 11/19/2021   PONV (postoperative nausea and vomiting)    Toe fracture    lt small toe    Past Surgical History:  Procedure Laterality Date   BOWEL RESECTION N/A 04/20/2022   Procedure: BOWEL RESECTION; DIVERTING ILEOSTOMY;  Surgeon: [name], MD;  Location: [facility];  Service: Gynecology;  Laterality: N/A;   DEBULKING N/A 04/20/2022   Procedure: TUMOR DEBULKING, OMENTECTOMY;  Surgeon: [name], MD;  Location: [facility];  Service: Gynecology;  Laterality: N/A;   HYSTERECTOMY ABDOMINAL WITH SALPINGO-OOPHORECTOMY Bilateral 04/20/2022   Procedure: HYSTERECTOMY ABDOMINAL WITH BILATERAL SALPINGO-OOPHORECTOMY;  Surgeon: [name], MD;  Location: [facility];  Service: Gynecology;  Laterality: Bilateral;   IR IMAGING GUIDED PORT INSERTION  11/26/2021   IR PARACENTESIS  11/23/2021   IR PARACENTESIS  11/26/2021   LAPAROSCOPY N/A 04/20/2022   Procedure: LAPAROSCOPY DIAGNOSTIC,CYSTO;  Surgeon: [name], MD;   Location: [facility];  Service: Gynecology;  Laterality: N/A;   lump removal  04/11/1993   axilla right - benign   TONSILLECTOMY     TUBAL LIGATION  04/11/1988    Family History  Problem Relation Age of Onset   Stroke Mother    Hypertension Mother    Hyperlipidemia Mother    Glaucoma Mother    Heart attack Father 14   Diabetes Father    Hypertension Father    Hyperlipidemia Father    Cataracts Father    Breast cancer Cousin    Colon cancer Neg Hx    Ovarian cancer Neg Hx    Endometrial cancer Neg Hx    Pancreatic cancer Neg Hx    Prostate cancer Neg Hx     Social History   Socioeconomic History   Marital status: Widowed    Spouse name: Not on file   Number of children: 1   Years of education: 14   Highest education level: Associate degree: occupational, Hotel manager, or vocational program  Occupational History   Occupation: retired Quarry manager  Tobacco Use   Smoking status: Former    Types: Cigarettes    Quit date: 03/20/1990    Years since quitting: 32.1   Smokeless tobacco: Never   Tobacco comments:    quit 1988  Vaping Use   Vaping Use: Never used  Substance and Sexual Activity   Alcohol use: Not Currently    Alcohol/week: 2.0 standard drinks of alcohol    Types: 2 Glasses of wine per week    Comment: Wine in the evenings   Drug use: No   Sexual activity: Not Currently    Partners: Male  Other Topics Concern   Not on file  Social History Narrative   Lives alone with her dog. Helps with her grandson pick up and volunteering at his school. She enjoys spending time with her family.   Social Determinants of Health   Financial Resource Strain: Low Risk  (03/26/2021)   Overall Financial Resource Strain (CARDIA)    Difficulty of Paying Living Expenses: Not hard at all  Food Insecurity: No Food Insecurity (04/26/2022)   Hunger Vital Sign    Worried About Running Out of Food in the Last Year: Never true    Ran Out of Food in the Last Year: Never true  Transportation  Needs: No Transportation Needs (04/26/2022)   PRAPARE - Hydrologist (Medical): No    Lack of Transportation (Non-Medical): No  Physical Activity: Inactive (03/26/2021)   Exercise Vital Sign    Days of Exercise per Week: 0 days    Minutes of Exercise per Session: 0 min  Stress: No Stress Concern Present (03/26/2021)   Lookingglass    Feeling of Stress : Not at all  Social Connections: Socially Isolated (03/26/2021)   Social Connection and Isolation Panel [NHANES]    Frequency of Communication with Friends and Family: More than three times a week    Frequency of Social Gatherings with Friends and Family: Three times a week    Attends Religious Services: Never    Active Member of Clubs or Organizations: No    Attends Archivist Meetings: Never    Marital Status: Widowed    Current Medications:  Current Outpatient Medications:    acetaminophen (TYLENOL) 325 MG tablet, Take 650 mg by mouth as needed for moderate pain., Disp: , Rfl:    acyclovir (ZOVIRAX) 200 MG capsule, Take 1 capsule (200 mg total) by mouth 3 (three) times daily., Disp: 60 capsule, Rfl: 1   amLODipine (NORVASC) 5 MG tablet, Take 5 mg by mouth daily., Disp: , Rfl:    atorvastatin (LIPITOR) 40 MG tablet, TAKE ONE TABLET BY MOUTH EVERY NIGHT AT BEDTIME, Disp: 90 tablet, Rfl: 3   Calcium-Vitamin D-Vitamin K (VIACTIV PO), Take 1 tablet by mouth in the morning and at bedtime., Disp: , Rfl:    cholecalciferol (VITAMIN D3) 25 MCG (1000 UNIT) tablet, Take 1,000 Units by mouth daily., Disp: , Rfl:    dexamethasone (DECADRON) 4 MG tablet, Take 2 tablets by mouth starting the day after chemotherapy for 3 days. Take with food., Disp: 30 tablet, Rfl: 1   lidocaine-prilocaine (EMLA) cream, Apply 1 Application topically as needed (prior to port access)., Disp: , Rfl:    magic mouthwash (nystatin, lidocaine, diphenhydrAMINE, alum & mag  hydroxide) suspension, Swish and spit 5 mLs 4 (four) times daily as needed for mouth pain. (Patient not taking: Reported on 04/06/2022), Disp: 240 mL, Rfl: 1   magnesium oxide (MAG-OX) 400 (240 Mg) MG tablet, Take 0.5 tablets (200 mg total) by mouth daily., Disp: 30 tablet, Rfl: 3   omeprazole (PRILOSEC) 20 MG capsule, Take 1 capsule (20 mg total) by mouth daily., Disp: 90 capsule, Rfl: 2   ondansetron (ZOFRAN) 8 MG tablet, Take 1 tablet (8 mg total) by mouth every 8 (eight) hours as needed for nausea or vomiting. Start on the third day after chemotherapy., Disp: 30 tablet, Rfl: 1   prochlorperazine (COMPAZINE) 10 MG tablet, Take 1 tablet (10 mg total) by mouth every 6 (six) hours as needed for nausea or vomiting., Disp: 30 tablet, Rfl: 1   rivaroxaban (XARELTO) 10 MG TABS tablet, Take 1 tablet (10 mg total) by mouth daily., Disp: 28 tablet, Rfl: 0   senna-docusate (SENOKOT-S) 8.6-50 MG tablet, Take 2 tablets by mouth at bedtime. For AFTER surgery, do not take if having diarrhea, Disp: 30 tablet, Rfl: 0   traMADol (ULTRAM) 50 MG tablet, Take 1 tablet (50 mg total) by mouth every 6 (six) hours as needed for severe pain. For AFTER surgery only, do not take and drive, Disp: 10 tablet, Rfl: 0   valsartan (DIOVAN) 160 MG tablet, Take 160 mg by mouth daily., Disp: , Rfl:   Review of Symptoms: Pertinent positives as per HPI.  Physical Exam: Deferred given limitations of phone visit.  Laboratory & Radiologic Studies: A. SMALL BOWEL ADHESION, EXCISION:  -Fibrous tissue with chronic inflammation, consistent with adhesions   B. SMALL BOWEL NODULE, EXCISION:  - Benign fibrotic nodule  - Negative for carcinoma   C. UTERUS, CERVIX, BILATERAL TUBES AND OVARIES, RECTOSIGMOID COLON,  RESECTION:  - Cervix: Benign, nabothian cyst  - Endometrium: Benign inactive endometrium  - Myometrium: No significant pathologic changes  - Ovary: High-grade serous carcinoma  - Colon: Metastatic serous carcinoma involving  colonic serosa  - See oncology table   D. OMENTUM, EXCISION:  - Involved by high-grade serous carcinoma   E. RECTAL DONUTS, EXCISION:  - Benign colonic mucosa with no significant pathologic changes   Assessment & Plan: OANH DEVIVO is a 72 y.o. 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  Patient is doing  very well, meeting postoperative milestones.  Discussed continued expectations and restrictions.  Also reviewed pathology from surgery.  All of the filmy adhesions between loops of small bowel as well as small nodules on the bowel showed no evidence of malignancy.  I suspect these were either treated or just a result of her prior treatment.  Discussed findings of right ovary as source of the malignancy and that colonic serosa was involved.  Reviewed plan to restart chemotherapy when she is healed, ideally about 3 weeks after surgery.  I discussed the assessment and treatment plan with the patient. The patient was provided with an opportunity to ask questions and all were answered. The patient agreed with the plan and demonstrated an understanding of the instructions.   The patient was advised to call back or see an in-person evaluation if the symptoms worsen or if the condition fails to improve as anticipated.   8 minutes of total time was spent for this patient encounter, including preparation, phone counseling with the patient and coordination of care, and documentation of the encounter.   Jeral Pinch, MD  Division of Gynecologic Oncology  Department of Obstetrics and Gynecology  [facility]

## 2022-04-27 NOTE — Progress Notes (Signed)
error 

## 2022-04-28 ENCOUNTER — Encounter: Payer: Self-pay | Admitting: *Deleted

## 2022-04-28 ENCOUNTER — Inpatient Hospital Stay: Payer: Medicare Other

## 2022-04-28 ENCOUNTER — Encounter: Payer: Self-pay | Admitting: Hematology & Oncology

## 2022-04-28 ENCOUNTER — Telehealth: Payer: Self-pay | Admitting: *Deleted

## 2022-04-28 DIAGNOSIS — I9589 Other hypotension: Secondary | ICD-10-CM | POA: Diagnosis not present

## 2022-04-28 DIAGNOSIS — R110 Nausea: Secondary | ICD-10-CM | POA: Diagnosis not present

## 2022-04-28 DIAGNOSIS — E876 Hypokalemia: Secondary | ICD-10-CM | POA: Diagnosis not present

## 2022-04-28 DIAGNOSIS — R42 Dizziness and giddiness: Secondary | ICD-10-CM | POA: Diagnosis not present

## 2022-04-28 DIAGNOSIS — Z90722 Acquired absence of ovaries, bilateral: Secondary | ICD-10-CM | POA: Diagnosis not present

## 2022-04-28 DIAGNOSIS — E875 Hyperkalemia: Secondary | ICD-10-CM | POA: Diagnosis not present

## 2022-04-28 DIAGNOSIS — Z932 Ileostomy status: Secondary | ICD-10-CM | POA: Diagnosis not present

## 2022-04-28 DIAGNOSIS — C561 Malignant neoplasm of right ovary: Secondary | ICD-10-CM

## 2022-04-28 DIAGNOSIS — C569 Malignant neoplasm of unspecified ovary: Secondary | ICD-10-CM | POA: Diagnosis not present

## 2022-04-28 DIAGNOSIS — R197 Diarrhea, unspecified: Secondary | ICD-10-CM | POA: Diagnosis not present

## 2022-04-28 DIAGNOSIS — Z90710 Acquired absence of both cervix and uterus: Secondary | ICD-10-CM | POA: Diagnosis not present

## 2022-04-28 DIAGNOSIS — N179 Acute kidney failure, unspecified: Secondary | ICD-10-CM | POA: Diagnosis not present

## 2022-04-28 DIAGNOSIS — E861 Hypovolemia: Secondary | ICD-10-CM | POA: Diagnosis not present

## 2022-04-28 LAB — CBC WITH DIFFERENTIAL (CANCER CENTER ONLY)
Abs Immature Granulocytes: 0.03 10*3/uL (ref 0.00–0.07)
Basophils Absolute: 0.1 10*3/uL (ref 0.0–0.1)
Basophils Relative: 1 %
Eosinophils Absolute: 0.1 10*3/uL (ref 0.0–0.5)
Eosinophils Relative: 1 %
HCT: 35 % — ABNORMAL LOW (ref 36.0–46.0)
Hemoglobin: 11.1 g/dL — ABNORMAL LOW (ref 12.0–15.0)
Immature Granulocytes: 0 %
Lymphocytes Relative: 13 %
Lymphs Abs: 1.2 10*3/uL (ref 0.7–4.0)
MCH: 31.4 pg (ref 26.0–34.0)
MCHC: 31.7 g/dL (ref 30.0–36.0)
MCV: 98.9 fL (ref 80.0–100.0)
Monocytes Absolute: 0.8 10*3/uL (ref 0.1–1.0)
Monocytes Relative: 8 %
Neutro Abs: 7.4 10*3/uL (ref 1.7–7.7)
Neutrophils Relative %: 77 %
Platelet Count: 369 10*3/uL (ref 150–400)
RBC: 3.54 MIL/uL — ABNORMAL LOW (ref 3.87–5.11)
RDW: 18.2 % — ABNORMAL HIGH (ref 11.5–15.5)
WBC Count: 9.5 10*3/uL (ref 4.0–10.5)
nRBC: 0 % (ref 0.0–0.2)

## 2022-04-28 LAB — CMP (CANCER CENTER ONLY)
ALT: 30 U/L (ref 0–44)
AST: 29 U/L (ref 15–41)
Albumin: 3.9 g/dL (ref 3.5–5.0)
Alkaline Phosphatase: 153 U/L — ABNORMAL HIGH (ref 38–126)
Anion gap: 12 (ref 5–15)
BUN: 20 mg/dL (ref 8–23)
CO2: 23 mmol/L (ref 22–32)
Calcium: 10.4 mg/dL — ABNORMAL HIGH (ref 8.9–10.3)
Chloride: 101 mmol/L (ref 98–111)
Creatinine: 1.05 mg/dL — ABNORMAL HIGH (ref 0.44–1.00)
GFR, Estimated: 56 mL/min — ABNORMAL LOW (ref >60)
Glucose, Bld: 109 mg/dL — ABNORMAL HIGH (ref 70–99)
Potassium: 4.4 mmol/L (ref 3.5–5.1)
Sodium: 136 mmol/L (ref 135–145)
Total Bilirubin: 0.4 mg/dL (ref 0.3–1.2)
Total Protein: 6.8 g/dL (ref 6.5–8.1)

## 2022-04-28 NOTE — Telephone Encounter (Signed)
Leann with [facility] home health called and LVM to obtain VO orders for pt's start of care for Ileostomy care.    I returned her called and informed her that Dr. Madilyn Fireman was not the ordering physician for Ms. Greenawalt's home health orders.   She stated that she would contact the surgeon about this.

## 2022-04-28 NOTE — Patient Instructions (Signed)

## 2022-04-28 NOTE — Patient Outreach (Signed)
  Care Coordination   Post-TOC Care Coordination Telephone  Visit Note   04/28/2022 Name: Maria Romero MRN: [id number] DOB: 1950-11-20  Maria Romero is a 72 y.o. year old female who sees Metheney, Rene Kocher, MD for primary care. I  Received incoming call/ spoke with [facility] team member, "Maria Romero;" [phone]) Maria Romero reports she is calling me today to confirm that care management TOC provided/ subsequent scheduled telephone visit with [facility] CM team is not home health services: verified this for Maria Romero, who confirmed that [facility] home health services have been initiated and home visits with patient will be scheduled promptly; no further action needed   SDOH assessments and interventions completed:  No Completed previously during initial TOC   Care Coordination Interventions:  Yes, provided   Follow up plan: Follow up call scheduled for Friday, 05/06/22 with Maria Silversmith, [place] Coordinator-- telephone call visit    Encounter Outcome:  Pt. Visit Completed   Oneta Rack, RN, BSN, CCRN Alumnus RN CM Care Coordination/ Transition of [place] Management [phone]: direct office

## 2022-04-29 ENCOUNTER — Telehealth: Payer: Self-pay | Admitting: Surgery

## 2022-04-29 ENCOUNTER — Telehealth: Payer: Self-pay

## 2022-04-29 NOTE — Telephone Encounter (Signed)
Patient called in stating that when she went to the bathroom this morning she had a quarter sized blood clot come out of her rectum. She wants to know if this is normal. States vaginal bleeding has stopped and has no blood in ostomy output. No further bleeding from rectum at this time. Denies abdominal pain or other symptoms. Advised patient some bleeding is expected post op but if bleeding continues or increases, or if blood appears in ostomy to call our office back for further follow up. Patient verbalized understanding and had no other concerns at this time.

## 2022-04-29 NOTE — Telephone Encounter (Signed)
Told Maria Romero that Dr. Berline Lopes wants her to take 2 mg tablet of imodium daily to help decrease ostomy output. Maria Romero states that she has # 23 tablets of imodium on hand.  Instructed her to take 1 tablet now and then take in am daily.  She is to continue to record her ostomy output  for 24 hour period from 11 am today to 11 am tomorrow as so on.  She is to call the office on Monday 05-02-22 to give the ostomy out put totals to see if the imodium dosing needs to be adjusted. Encouraged pt to take in at least 64 oz of caffeine free fluid.  Dehydration can cause an increase in the amount of Xarelto in her system and could increase chances of bleeding. Pt verbalized understanding.

## 2022-04-29 NOTE — Telephone Encounter (Signed)
This is normal after the procedure she had (removal of part of her lower colon and putting it back today). Please just tell her to keep an eye on things.

## 2022-04-29 NOTE — Telephone Encounter (Signed)
Told Maria Romero that her creatine is elevated at 1.05.  She states that she is not a big fluid drinker. She may take in ~20 oz a day.  Told her that she needs to increase her fluid intake to 64 oz of caffeine free fluid in 24 hrs.  Pt states that she will try to increase fluids. Pt states that her ostomy from 11 am 04-28-22 through 04-29-22 11 am put out 46 ounces = 1380 cc. Information given to Joylene John, NP to review.

## 2022-05-01 DIAGNOSIS — E785 Hyperlipidemia, unspecified: Secondary | ICD-10-CM | POA: Diagnosis not present

## 2022-05-01 DIAGNOSIS — Z432 Encounter for attention to ileostomy: Secondary | ICD-10-CM | POA: Diagnosis not present

## 2022-05-01 DIAGNOSIS — Z87891 Personal history of nicotine dependence: Secondary | ICD-10-CM | POA: Diagnosis not present

## 2022-05-01 DIAGNOSIS — I10 Essential (primary) hypertension: Secondary | ICD-10-CM | POA: Diagnosis not present

## 2022-05-01 DIAGNOSIS — Z9049 Acquired absence of other specified parts of digestive tract: Secondary | ICD-10-CM | POA: Diagnosis not present

## 2022-05-01 DIAGNOSIS — Z888 Allergy status to other drugs, medicaments and biological substances status: Secondary | ICD-10-CM | POA: Diagnosis not present

## 2022-05-01 DIAGNOSIS — Z79899 Other long term (current) drug therapy: Secondary | ICD-10-CM | POA: Diagnosis not present

## 2022-05-01 DIAGNOSIS — Z7982 Long term (current) use of aspirin: Secondary | ICD-10-CM | POA: Diagnosis not present

## 2022-05-01 DIAGNOSIS — K9413 Enterostomy malfunction: Secondary | ICD-10-CM | POA: Diagnosis not present

## 2022-05-01 DIAGNOSIS — Z9889 Other specified postprocedural states: Secondary | ICD-10-CM | POA: Diagnosis not present

## 2022-05-02 ENCOUNTER — Encounter: Payer: Self-pay | Admitting: Hematology & Oncology

## 2022-05-02 NOTE — Telephone Encounter (Signed)
Call to patient.  Advised of instructions from Dr Berline Lopes to take Imodium daily.  Patient states she added this to her routine this morning.  Will continue to monitor ostomy output and call with update in three days as she has been .  Advised if output does not improve, or remains > 50 oz, call back prior to the three day update.  Patient agreeable to plan.

## 2022-05-02 NOTE — Telephone Encounter (Signed)
Since her output was > 2L yesterday, please encourage her to take immodium

## 2022-05-02 NOTE — Telephone Encounter (Addendum)
I spoke to Maria Romero this morning, she stated she was in the ER yesterday d/t the bag not sticking. They ended up giving her another brand [employer]or, mining) which seems to be doing well.  Her output is as follows from  1/19 (2:00pm) -1/22 (8:00am) output was 82oz= 2425cc. She states she has increased her fluid intake, but didn't get as much in yesterday since she was in the ER.  Maria John NP and Maria Romero notified  Ostomy clinic, tried to call pt x1,   number [phone] given to pt. She voiced an understanding on needing to call them.

## 2022-05-03 ENCOUNTER — Other Ambulatory Visit: Payer: Self-pay | Admitting: Oncology

## 2022-05-03 ENCOUNTER — Encounter: Payer: Self-pay | Admitting: Gynecologic Oncology

## 2022-05-03 ENCOUNTER — Telehealth: Payer: Self-pay | Admitting: *Deleted

## 2022-05-03 ENCOUNTER — Observation Stay (HOSPITAL_COMMUNITY)
Admission: EM | Admit: 2022-05-03 | Discharge: 2022-05-05 | Disposition: A | Discharge status: Home / Self Care | Payer: Medicare Other | Attending: Internal Medicine | Admitting: Internal Medicine

## 2022-05-03 ENCOUNTER — Inpatient Hospital Stay (HOSPITAL_BASED_OUTPATIENT_CLINIC_OR_DEPARTMENT_OTHER): Payer: Medicare Other | Admitting: Gynecologic Oncology

## 2022-05-03 ENCOUNTER — Encounter (HOSPITAL_COMMUNITY): Payer: Self-pay | Admitting: Internal Medicine

## 2022-05-03 ENCOUNTER — Other Ambulatory Visit: Payer: Self-pay

## 2022-05-03 ENCOUNTER — Inpatient Hospital Stay: Payer: Medicare Other

## 2022-05-03 VITALS — BP 95/58 | HR 82 | Temp 97.0°F | Resp 20 | Ht 63.0 in | Wt 111.0 lb

## 2022-05-03 DIAGNOSIS — E872 Acidosis, unspecified: Secondary | ICD-10-CM | POA: Diagnosis present

## 2022-05-03 DIAGNOSIS — N179 Acute kidney failure, unspecified: Secondary | ICD-10-CM | POA: Diagnosis present

## 2022-05-03 DIAGNOSIS — Z90710 Acquired absence of both cervix and uterus: Secondary | ICD-10-CM

## 2022-05-03 DIAGNOSIS — E861 Hypovolemia: Secondary | ICD-10-CM

## 2022-05-03 DIAGNOSIS — I10 Essential (primary) hypertension: Secondary | ICD-10-CM | POA: Diagnosis not present

## 2022-05-03 DIAGNOSIS — Z932 Ileostomy status: Secondary | ICD-10-CM | POA: Insufficient documentation

## 2022-05-03 DIAGNOSIS — C569 Malignant neoplasm of unspecified ovary: Secondary | ICD-10-CM

## 2022-05-03 DIAGNOSIS — E875 Hyperkalemia: Principal | ICD-10-CM | POA: Diagnosis present

## 2022-05-03 DIAGNOSIS — Z8543 Personal history of malignant neoplasm of ovary: Secondary | ICD-10-CM | POA: Insufficient documentation

## 2022-05-03 DIAGNOSIS — E860 Dehydration: Secondary | ICD-10-CM | POA: Diagnosis not present

## 2022-05-03 DIAGNOSIS — I959 Hypotension, unspecified: Secondary | ICD-10-CM | POA: Diagnosis not present

## 2022-05-03 DIAGNOSIS — R198 Other specified symptoms and signs involving the digestive system and abdomen: Secondary | ICD-10-CM

## 2022-05-03 DIAGNOSIS — Z79899 Other long term (current) drug therapy: Secondary | ICD-10-CM | POA: Diagnosis not present

## 2022-05-03 DIAGNOSIS — Z90722 Acquired absence of ovaries, bilateral: Secondary | ICD-10-CM

## 2022-05-03 DIAGNOSIS — R197 Diarrhea, unspecified: Secondary | ICD-10-CM

## 2022-05-03 DIAGNOSIS — Z87891 Personal history of nicotine dependence: Secondary | ICD-10-CM | POA: Insufficient documentation

## 2022-05-03 DIAGNOSIS — Z7189 Other specified counseling: Secondary | ICD-10-CM

## 2022-05-03 DIAGNOSIS — I9589 Other hypotension: Secondary | ICD-10-CM

## 2022-05-03 DIAGNOSIS — C561 Malignant neoplasm of right ovary: Secondary | ICD-10-CM

## 2022-05-03 LAB — BASIC METABOLIC PANEL
Anion gap: 7 (ref 5–15)
BUN: 46 mg/dL — ABNORMAL HIGH (ref 8–23)
CO2: 16 mmol/L — ABNORMAL LOW (ref 22–32)
Calcium: 8.7 mg/dL — ABNORMAL LOW (ref 8.9–10.3)
Chloride: 110 mmol/L (ref 98–111)
Creatinine, Ser: 1.49 mg/dL — ABNORMAL HIGH (ref 0.44–1.00)
GFR, Estimated: 37 mL/min — ABNORMAL LOW (ref >60)
Glucose, Bld: 114 mg/dL — ABNORMAL HIGH (ref 70–99)
Potassium: 5.2 mmol/L — ABNORMAL HIGH (ref 3.5–5.1)
Sodium: 133 mmol/L — ABNORMAL LOW (ref 135–145)

## 2022-05-03 LAB — CBC WITH DIFFERENTIAL (CANCER CENTER ONLY)
Abs Immature Granulocytes: 0.04 10*3/uL (ref 0.00–0.07)
Basophils Absolute: 0.1 10*3/uL (ref 0.0–0.1)
Basophils Relative: 1 %
Eosinophils Absolute: 0.1 10*3/uL (ref 0.0–0.5)
Eosinophils Relative: 1 %
HCT: 37.1 % (ref 36.0–46.0)
Hemoglobin: 12.2 g/dL (ref 12.0–15.0)
Immature Granulocytes: 0 %
Lymphocytes Relative: 12 %
Lymphs Abs: 1.3 10*3/uL (ref 0.7–4.0)
MCH: 32.2 pg (ref 26.0–34.0)
MCHC: 32.9 g/dL (ref 30.0–36.0)
MCV: 97.9 fL (ref 80.0–100.0)
Monocytes Absolute: 0.8 10*3/uL (ref 0.1–1.0)
Monocytes Relative: 7 %
Neutro Abs: 9.2 10*3/uL — ABNORMAL HIGH (ref 1.7–7.7)
Neutrophils Relative %: 79 %
Platelet Count: 374 10*3/uL (ref 150–400)
RBC: 3.79 MIL/uL — ABNORMAL LOW (ref 3.87–5.11)
RDW: 17.6 % — ABNORMAL HIGH (ref 11.5–15.5)
WBC Count: 11.5 10*3/uL — ABNORMAL HIGH (ref 4.0–10.5)
nRBC: 0 % (ref 0.0–0.2)

## 2022-05-03 LAB — URINALYSIS, ROUTINE W REFLEX MICROSCOPIC
Bilirubin Urine: NEGATIVE
Glucose, UA: 150 mg/dL — AB
Hgb urine dipstick: NEGATIVE
Ketones, ur: NEGATIVE mg/dL
Nitrite: NEGATIVE
Protein, ur: NEGATIVE mg/dL
Specific Gravity, Urine: 1.008 (ref 1.005–1.030)
pH: 5 (ref 5.0–8.0)

## 2022-05-03 LAB — COMPREHENSIVE METABOLIC PANEL
ALT: 19 U/L (ref 0–44)
AST: 17 U/L (ref 15–41)
Albumin: 4.2 g/dL (ref 3.5–5.0)
Alkaline Phosphatase: 136 U/L — ABNORMAL HIGH (ref 38–126)
Anion gap: 8 (ref 5–15)
BUN: 46 mg/dL — ABNORMAL HIGH (ref 8–23)
CO2: 20 mmol/L — ABNORMAL LOW (ref 22–32)
Calcium: 10.3 mg/dL (ref 8.9–10.3)
Chloride: 104 mmol/L (ref 98–111)
Creatinine, Ser: 1.79 mg/dL — ABNORMAL HIGH (ref 0.44–1.00)
GFR, Estimated: 30 mL/min — ABNORMAL LOW (ref >60)
Glucose, Bld: 96 mg/dL (ref 70–99)
Potassium: 6.8 mmol/L (ref 3.5–5.1)
Sodium: 132 mmol/L — ABNORMAL LOW (ref 135–145)
Total Bilirubin: 0.5 mg/dL (ref 0.3–1.2)
Total Protein: 7.2 g/dL (ref 6.5–8.1)

## 2022-05-03 LAB — MAGNESIUM: Magnesium: 1 mg/dL — ABNORMAL LOW (ref 1.7–2.4)

## 2022-05-03 LAB — PHOSPHORUS: Phosphorus: 5.1 mg/dL — ABNORMAL HIGH (ref 2.5–4.6)

## 2022-05-03 LAB — LACTIC ACID, PLASMA
Lactic Acid, Venous: 2.5 mmol/L (ref 0.5–1.9)
Lactic Acid, Venous: 1.5 mmol/L (ref 0.5–1.9)

## 2022-05-03 MED ORDER — TRAMADOL HCL 50 MG PO TABS: 50.0000 mg | ORAL_TABLET | Freq: Four times a day (QID) | ORAL | Status: DC | PRN

## 2022-05-03 MED ORDER — LOPERAMIDE HCL 2 MG PO CAPS: 2.0000 mg | ORAL_CAPSULE | Freq: Four times a day (QID) | ORAL | Status: DC | PRN

## 2022-05-03 MED ORDER — NYSTATIN 100000 UNIT/ML MT SUSP: 5.0000 mL | Freq: Four times a day (QID) | OROMUCOSAL | Status: DC | PRN

## 2022-05-03 MED ORDER — ONDANSETRON HCL 4 MG PO TABS: 8.0000 mg | ORAL_TABLET | Freq: Three times a day (TID) | ORAL | Status: DC | PRN

## 2022-05-03 MED ORDER — PROCHLORPERAZINE MALEATE 10 MG PO TABS: 10.0000 mg | ORAL_TABLET | Freq: Four times a day (QID) | ORAL | Status: DC | PRN

## 2022-05-03 MED ORDER — DEXTROSE 50 % IV SOLN
1.0000 | Freq: Once | INTRAVENOUS | Status: AC
  Administered 2022-05-03: 50 mL via INTRAVENOUS
  Filled 2022-05-03: qty 50

## 2022-05-03 MED ORDER — PANTOPRAZOLE SODIUM 40 MG PO TBEC
40.0000 mg | DELAYED_RELEASE_TABLET | Freq: Every day | ORAL | Status: DC
  Administered 2022-05-04 – 2022-05-05 (×2): 40 mg via ORAL
  Filled 2022-05-03 (×2): qty 1

## 2022-05-03 MED ORDER — SODIUM CHLORIDE 0.45 % IV SOLN: INTRAVENOUS | Status: DC

## 2022-05-03 MED ORDER — SODIUM CHLORIDE 0.9 % IV BOLUS
1000.0000 mL | Freq: Once | INTRAVENOUS | Status: AC
  Administered 2022-05-03: 1000 mL via INTRAVENOUS

## 2022-05-03 MED ORDER — AMLODIPINE BESYLATE 5 MG PO TABS
5.0000 mg | ORAL_TABLET | Freq: Every day | ORAL | Status: DC
  Administered 2022-05-04: 5 mg via ORAL
  Filled 2022-05-03: qty 1

## 2022-05-03 MED ORDER — MAGNESIUM OXIDE -MG SUPPLEMENT 400 (240 MG) MG PO TABS
200.0000 mg | ORAL_TABLET | Freq: Every day | ORAL | Status: DC
  Administered 2022-05-04: 200 mg via ORAL
  Filled 2022-05-03: qty 1

## 2022-05-03 MED ORDER — RIVAROXABAN 10 MG PO TABS
10.0000 mg | ORAL_TABLET | Freq: Every day | ORAL | Status: DC
  Administered 2022-05-03 – 2022-05-04 (×2): 10 mg via ORAL
  Filled 2022-05-03 (×2): qty 1

## 2022-05-03 MED ORDER — SODIUM CHLORIDE 0.9 % IV SOLN: INTRAVENOUS | Status: DC

## 2022-05-03 MED ORDER — ATORVASTATIN CALCIUM 40 MG PO TABS
40.0000 mg | ORAL_TABLET | Freq: Every day | ORAL | Status: DC
  Administered 2022-05-03 – 2022-05-04 (×2): 40 mg via ORAL
  Filled 2022-05-03 (×2): qty 1

## 2022-05-03 MED ORDER — ACETAMINOPHEN 500 MG PO TABS: 500.0000 mg | ORAL_TABLET | Freq: Four times a day (QID) | ORAL | Status: DC | PRN

## 2022-05-03 MED ORDER — MAGIC MOUTHWASH W/LIDOCAINE: 5.0000 mL | Freq: Four times a day (QID) | ORAL | Status: DC | PRN

## 2022-05-03 MED ORDER — IRBESARTAN 150 MG PO TABS
150.0000 mg | ORAL_TABLET | Freq: Every day | ORAL | Status: DC
  Administered 2022-05-04 – 2022-05-05 (×2): 150 mg via ORAL
  Filled 2022-05-03 (×3): qty 1

## 2022-05-03 MED ORDER — LOPERAMIDE HCL 2 MG PO CAPS
2.0000 mg | ORAL_CAPSULE | Freq: Two times a day (BID) | ORAL | Status: DC
  Administered 2022-05-03: 2 mg via ORAL
  Filled 2022-05-03: qty 1

## 2022-05-03 MED ORDER — LIDOCAINE-PRILOCAINE 2.5-2.5 % EX CREA: 1.0000 | TOPICAL_CREAM | CUTANEOUS | Status: DC | PRN

## 2022-05-03 MED ORDER — SODIUM BICARBONATE 8.4 % IV SOLN
25.0000 meq | Freq: Once | INTRAVENOUS | Status: AC
  Administered 2022-05-03: 25 meq via INTRAVENOUS
  Filled 2022-05-03: qty 50

## 2022-05-03 MED ORDER — INSULIN ASPART 100 UNIT/ML IV SOLN
5.0000 [IU] | Freq: Once | INTRAVENOUS | Status: AC
  Administered 2022-05-03: 5 [IU] via INTRAVENOUS
  Filled 2022-05-03: qty 0.05

## 2022-05-03 MED ORDER — ALBUTEROL SULFATE (2.5 MG/3ML) 0.083% IN NEBU
5.0000 mg | INHALATION_SOLUTION | Freq: Once | RESPIRATORY_TRACT | Status: AC
  Administered 2022-05-03: 5 mg via RESPIRATORY_TRACT
  Filled 2022-05-03: qty 6

## 2022-05-03 NOTE — Telephone Encounter (Signed)
Call from [facility] calling for verbal orders for weekly nursing visit for ostomy instruction. Additionally request OT consult for assistance with ADL after recent surgery and new ostomy. Reviewed with Maria John, NP and approved these orders. Requested follow-up with patient tomorrow to confirm she has adequate supplies as she was in-office today in need of supplies. [place] agreeable to follow-up tomorrow to confirm patient has received ordered supplies.

## 2022-05-03 NOTE — Subjective & Objective (Signed)
Maria Romero, a 72 y/o, recently diagnosed with ovarian cancer who underwent TAH/BSO, tumor debulking and bowel resection with creation of an ileostomy 04/20/22. She has had high volume output of liquid stool - approximately a liter or more per day. She presented to GYN-onc clinic today for suture removal. Due to her report of weakness stat labs were ordered which revealed hyperkalemia to 6.8 with AKI with BUN 46, Cr 1.79. she was transferred to [facility] for continued evaluation and treatment.

## 2022-05-03 NOTE — ED Triage Notes (Signed)
Pt arrives from [facility] center. Here for staple removal post surgery 1/10. Labs drawn and critical Potassium resulted of 6.8.

## 2022-05-03 NOTE — H&P (Signed)
History and Physical    Maria Romero:[id number] DOB: November 19, 1950 DOA: 05/03/2022  DOS: the patient was seen and examined on 05/03/2022  PCP: Hali Marry, MD   Patient coming from: Clinic  I have personally briefly reviewed patient's old medical records in [facility]  Maria Romero, a 72 y/o, recently diagnosed with ovarian cancer who underwent TAH/BSO, tumor debulking and bowel resection with creation of an ileostomy 04/20/22. She has had high volume output of liquid stool - approximately a liter or more per day. She presented to GYN-onc clinic today for suture removal. Due to her report of weakness stat labs were ordered which revealed hyperkalemia to 6.8 with AKI with BUN 46, Cr 1.79. she was transferred to [facility] for continued evaluation and treatment.   ED Course: afebrle, 94/56  74  16. In ED patient acute treated with 1L bolus NS, given D50 and insulin. Repeat K - 5.2, Repeat Cr 1.49. TRH called to admit for continued mgt.   Review of Systems:  Review of Systems  Constitutional: Negative.   HENT: Negative.    Eyes: Negative.   Respiratory: Negative.    Cardiovascular: Negative.   Gastrointestinal:  Positive for diarrhea.  Genitourinary: Negative.   Musculoskeletal: Negative.   Skin: Negative.   Neurological: Negative.   Endo/Heme/Allergies: Negative.   Psychiatric/Behavioral: Negative.      Past Medical History:  Diagnosis Date   Anemia    Complication of anesthesia    Dyspnea    Goals of care, counseling/discussion 12/01/2021   Hypercholesterolemia    Hypertension    IBS (irritable bowel syndrome)    Malignant ascites 11/19/2021   Osteopenia    Osteoporosis    [name], right ([place]) 12/01/2021   Ovarian mass, right 11/19/2021   PONV (postoperative nausea and vomiting)    Toe fracture    lt small toe    Past Surgical History:  Procedure Laterality Date   BOWEL RESECTION N/A 04/20/2022   Procedure: BOWEL RESECTION; DIVERTING ILEOSTOMY;   Surgeon: [name], MD;  Location: [facility];  Service: Gynecology;  Laterality: N/A;   DEBULKING N/A 04/20/2022   Procedure: TUMOR DEBULKING, OMENTECTOMY;  Surgeon: [name], MD;  Location: [facility];  Service: Gynecology;  Laterality: N/A;   HYSTERECTOMY ABDOMINAL WITH SALPINGO-OOPHORECTOMY Bilateral 04/20/2022   Procedure: HYSTERECTOMY ABDOMINAL WITH BILATERAL SALPINGO-OOPHORECTOMY;  Surgeon: [name], MD;  Location: [facility];  Service: Gynecology;  Laterality: Bilateral;   IR IMAGING GUIDED PORT INSERTION  11/26/2021   IR PARACENTESIS  11/23/2021   IR PARACENTESIS  11/26/2021   LAPAROSCOPY N/A 04/20/2022   Procedure: LAPAROSCOPY DIAGNOSTIC,CYSTO;  Surgeon: [name], MD;  Location: [facility];  Service: Gynecology;  Laterality: N/A;   lump removal  04/11/1993   axilla right - benign   TONSILLECTOMY     TUBAL LIGATION  04/11/1988    Soc Hx - widowed x 14 years. Has one daughter, two grandchildren. Lives alone.    reports that she quit smoking about 32 years ago. Her smoking use included cigarettes. She has never used smokeless tobacco. She reports that she does not currently use alcohol after a past usage of about 2.0 standard drinks of alcohol per week. She reports that she does not use drugs.  Allergies  Allergen Reactions   Fluvastatin Sodium Other (See Comments)    Myalgias    Family History  Problem Relation Age of Onset   Stroke Mother    Hypertension Mother    Hyperlipidemia  Mother    Glaucoma Mother    Heart attack Father 64   Diabetes Father    Hypertension Father    Hyperlipidemia Father    Cataracts Father    Breast cancer Cousin    Colon cancer Neg Hx    Ovarian cancer Neg Hx    Endometrial cancer Neg Hx    Pancreatic cancer Neg Hx    Prostate cancer Neg Hx     Prior to Admission medications   Medication Sig Start Date End Date Taking? Authorizing Provider  amLODipine (NORVASC) 5 MG tablet Take 5 mg by mouth daily. 02/02/22  Yes  [provider]  atorvastatin (LIPITOR) 40 MG tablet TAKE ONE TABLET BY MOUTH EVERY NIGHT AT BEDTIME 12/29/21  Yes Hali Marry, MD  Calcium-Vitamin D-Vitamin K (VIACTIV PO) Take 1 tablet by mouth in the morning and at bedtime.   Yes [provider]  cholecalciferol (VITAMIN D3) 25 MCG (1000 UNIT) tablet Take 1,000 Units by mouth daily.   Yes [provider]  lidocaine-prilocaine (EMLA) cream Apply 1 Application topically as needed (for port access).   Yes [provider]  loperamide (IMODIUM A-D) 2 MG tablet Take 2 mg by mouth 4 (four) times daily as needed for diarrhea or loose stools.   Yes [provider]  magic mouthwash (nystatin, lidocaine, diphenhydrAMINE, alum & mag hydroxide) suspension Swish and spit 5 mLs 4 (four) times daily as needed for mouth pain. 02/01/22  Yes Covington, Judson Roch M, PA-C  magnesium oxide (MAG-OX) 400 (240 Mg) MG tablet Take 0.5 tablets (200 mg total) by mouth daily. 04/25/22  Yes Cross, Melissa D, NP  omeprazole (PRILOSEC) 20 MG capsule Take 1 capsule (20 mg total) by mouth daily. Patient taking differently: Take 20 mg by mouth daily before breakfast. 02/01/22  Yes Covington, Sarah M, PA-C  ondansetron (ZOFRAN) 8 MG tablet Take 1 tablet (8 mg total) by mouth every 8 (eight) hours as needed for nausea or vomiting. Start on the third day after chemotherapy. 02/01/22  Yes Covington, Sarah M, PA-C  prochlorperazine (COMPAZINE) 10 MG tablet Take 1 tablet (10 mg total) by mouth every 6 (six) hours as needed for nausea or vomiting. 02/01/22  Yes Covington, Sarah M, PA-C  rivaroxaban (XARELTO) 10 MG TABS tablet Take 1 tablet (10 mg total) by mouth daily. Patient taking differently: Take 10 mg by mouth at bedtime. 04/25/22  Yes Cross, Melissa D, NP  traMADol (ULTRAM) 50 MG tablet Take 1 tablet (50 mg total) by mouth every 6 (six) hours as needed for severe pain. For AFTER surgery only, do not take and drive Patient taking  differently: Take 50 mg by mouth every 6 (six) hours as needed for severe pain (do not take this and drive). 04/08/22  Yes Cross, Melissa D, NP  TYLENOL 500 MG tablet Take 500-1,000 mg by mouth every 6 (six) hours as needed for mild pain or headache.   Yes [provider]  valsartan (DIOVAN) 160 MG tablet Take 160 mg by mouth daily.   Yes [provider]  acyclovir (ZOVIRAX) 200 MG capsule Take 1 capsule (200 mg total) by mouth 3 (three) times daily. Patient not taking: Reported on 05/03/2022 03/23/22   Volanda Napoleon, MD  dexamethasone (DECADRON) 4 MG tablet Take 2 tablets by mouth starting the day after chemotherapy for 3 days. Take with food. Patient not taking: Reported on 05/03/2022 02/01/22   Hughie Closs, PA-C  senna-docusate (SENOKOT-S) 8.6-50 MG tablet Take 2 tablets  by mouth at bedtime. For AFTER surgery, do not take if having diarrhea Patient not taking: Reported on 05/03/2022 04/08/22   Joylene John D, NP    Physical Exam: Vitals:   05/03/22 2030 05/03/22 2055 05/03/22 2100 05/03/22 2130  BP: (!) 94/55  (!) 102/53 (!) 94/56  Pulse: 86  80 74  Resp: (!) '26  18 16  '$ Temp:  97.7 F (36.5 C)    TempSrc:  Oral    SpO2: 100%  100% 100%    Physical Exam Vitals and nursing note reviewed.  Constitutional:      General: She is not in acute distress.    Appearance: Normal appearance. She is not ill-appearing.  HENT:     Head: Normocephalic and atraumatic.     Mouth/Throat:     Mouth: Mucous membranes are dry.     Pharynx: No oropharyngeal exudate.  Eyes:     Extraocular Movements: Extraocular movements intact.     Conjunctiva/sclera: Conjunctivae normal.     Pupils: Pupils are equal, round, and reactive to light.  Cardiovascular:     Rate and Rhythm: Normal rate and regular rhythm.     Pulses: Normal pulses.     Heart sounds: Normal heart sounds.  Pulmonary:     Effort: Pulmonary effort is normal.     Breath sounds: Normal breath sounds.   Abdominal:     General: There is no distension.     Palpations: Abdomen is soft.     Tenderness: There is no abdominal tenderness.     Comments: Ostomy bag RLQ  Musculoskeletal:        General: Normal range of motion.     Cervical back: Normal range of motion.  Skin:    General: Skin is warm and dry.  Neurological:     General: No focal deficit present.     Mental Status: She is alert and oriented to person, place, and time.     Cranial Nerves: No cranial nerve deficit.  Psychiatric:        Mood and Affect: Mood normal.        Behavior: Behavior normal.      Labs on Admission: I have personally reviewed following labs and imaging studies  CBC: Recent Labs  Lab 04/28/22 1447 05/03/22 1544  WBC 9.5 11.5*  NEUTROABS 7.4 9.2*  HGB 11.1* 12.2  HCT 35.0* 37.1  MCV 98.9 97.9  PLT 369 939   Basic Metabolic Panel: Recent Labs  Lab 04/28/22 1447 05/03/22 1544 05/03/22 1733  NA 136 132* 133*  K 4.4 6.8* 5.2*  CL 101 104 110  CO2 23 20* 16*  GLUCOSE 109* 96 114*  BUN 20 46* 46*  CREATININE 1.05* 1.79* 1.49*  CALCIUM 10.4* 10.3 8.7*  MG  --  1.0*  --   PHOS  --  5.1*  --    GFR: Estimated Creatinine Clearance: 26.7 mL/min (A) (by C-G formula based on SCr of 1.49 mg/dL (H)). Liver Function Tests: Recent Labs  Lab 04/28/22 1447 05/03/22 1544  AST 29 17  ALT 30 19  ALKPHOS 153* 136*  BILITOT 0.4 0.5  PROT 6.8 7.2  ALBUMIN 3.9 4.2   No results for input(s): "LIPASE", "AMYLASE" in the last 168 hours. No results for input(s): "AMMONIA" in the last 168 hours. Coagulation Profile: No results for input(s): "INR", "PROTIME" in the last 168 hours. Cardiac Enzymes: No results for input(s): "CKTOTAL", "CKMB", "CKMBINDEX", "TROPONINI" in the last 168 hours. BNP (last 3 results) No  results for input(s): "PROBNP" in the last 8760 hours. HbA1C: No results for input(s): "HGBA1C" in the last 72 hours. CBG: No results for input(s): "GLUCAP" in the last 168 hours. Lipid  Profile: No results for input(s): "CHOL", "HDL", "LDLCALC", "TRIG", "CHOLHDL", "LDLDIRECT" in the last 72 hours. Thyroid Function Tests: No results for input(s): "TSH", "T4TOTAL", "FREET4", "T3FREE", "THYROIDAB" in the last 72 hours. Anemia Panel: No results for input(s): "VITAMINB12", "FOLATE", "FERRITIN", "TIBC", "IRON", "RETICCTPCT" in the last 72 hours. Urine analysis:    Component Value Date/Time   COLORURINE STRAW (A) 05/03/2022 1829   APPEARANCEUR CLEAR 05/03/2022 1829   LABSPEC 1.008 05/03/2022 1829   PHURINE 5.0 05/03/2022 1829   GLUCOSEU 150 (A) 05/03/2022 1829   HGBUR NEGATIVE 05/03/2022 1829   BILIRUBINUR NEGATIVE 05/03/2022 1829   KETONESUR NEGATIVE 05/03/2022 1829   PROTEINUR NEGATIVE 05/03/2022 1829   NITRITE NEGATIVE 05/03/2022 1829   LEUKOCYTESUR TRACE (A) 05/03/2022 1829    Radiological Exams on Admission: I have personally reviewed images No results found.  EKG: I have personally reviewed EKG: SR, no peaked T wave  Assessment/Plan Principal Problem:   Hyperkalemia Active Problems:   Essential hypertension   Goals of care, counseling/discussion   Ovarian cancer ([place])    Assessment and Plan: * Hyperkalemia Patient presented with hyperkalemia. In ED she received insulin/D50 and IVF bolus of 1 Liter. Follow up Bmet with K 5.2, Cr 1.49  Plan Continue IV hydration  Antimotility agent to slow high volume ileostomy output.  Ovarian cancer [facility]) Per GYN-Oncology  Essential hypertension BP low at presentation.  Plan Volume resuscitate  Hold antihypertensives until BP 110/70 or greater       DVT prophylaxis: Xarelto Code Status: Full Code Family Communication: patient asked that I not call daughter who has just left  Disposition Plan: home 23-48 hrs  Consults called: Gyn-onc will round  Admission status: Observation, Telemetry bed   Adella Hare, MD [facility] 05/03/2022, 10:10 PM

## 2022-05-03 NOTE — Progress Notes (Signed)
Gynecologic Oncology Post-op Follow Up  Patient presents to the office today for staple removal and post operative follow-up.  She is status post diagnostic laparoscopy, exploratory laparotomy with lysis of adhesions for approximately 45 minutes, omentectomy including mobilization of the splenic flexure, radical tumor debulking with en bloc resection of bilateral adnexa, uterus, cervix and rectosigmoid colon, end-to-end colonic reanastomosis, diverting loop ileostomy, cystoscopy on 04/20/2022 with Dr. Jeral Pinch for advanced high grade ovarian cancer.  She reports not feeling well today.  She has had mild nausea with no emesis.  No fever or chills reported.  Has had shortness of breath with no increased pain on deep inspiration that was present prior to surgery.  She has not been drinking large amounts but has been trying to stay hydrated.  She is not a big water drinker. She reports feeling woozy on and off today. Denies chest pain and dyspnea. She has she has difficulty finding things to snack on given the dietary recommendations with her ostomy.  Her ostomy has been draining liquid stool.  She does not feel like this is more than 1000 cc a day.  She is emptying her bladder without difficulty and reports her urine as not overly concentrated.  No abnormal bleeding noted.  She has been taking Xarelto as prescribed.  Upon initial set of vitals with patient entering the room blood pressure was checked at 83/58, pulse 88, respirations 20, temp 97.0.  Her weight was noted at 111 pounds from around 129 to 130 pounds on admission to the hospital for surgery.  She has had challenges with getting the ostomy appliance to stick and had to go to the emergency room over the weekend.  She currently has the [place] on with no bracket and states this has been sticking well, with this bag on since Sunday. She has no bags at home.  She was given a bag adhesive which she applied on the borders of the appliance and  this has been holding well.  She denies any issues with her abdominal incision.  No redness or drainage reported.  She states she has never had staples removed and she is nervous.  Given BP and reported symptoms, plans to obtain labs and give NS bolus 1 L. Plan discussed with Dr. Bernadene Bell.   CBC    Component Value Date/Time   WBC 11.5 (H) 05/03/2022 1544   WBC 5.8 04/24/2022 0500   RBC 3.79 (L) 05/03/2022 1544   HGB 12.2 05/03/2022 1544   HCT 37.1 05/03/2022 1544   PLT 374 05/03/2022 1544   MCV 97.9 05/03/2022 1544   MCH 32.2 05/03/2022 1544   MCHC 32.9 05/03/2022 1544   RDW 17.6 (H) 05/03/2022 1544   LYMPHSABS 1.3 05/03/2022 1544   MONOABS 0.8 05/03/2022 1544   EOSABS 0.1 05/03/2022 1544   BASOSABS 0.1 05/03/2022 1544       Latest Ref Rng & Units 05/03/2022    3:44 PM 04/28/2022    2:47 PM 04/25/2022    6:27 AM  CMP  Glucose 70 - 99 mg/dL 96  109  90   BUN 8 - 23 mg/dL 46  20  9   Creatinine 0.44 - 1.00 mg/dL 1.79  1.05  0.78   Sodium 135 - 145 mmol/L 132  136  136   Potassium 3.5 - 5.1 mmol/L 6.8  4.4  4.1   Chloride 98 - 111 mmol/L 104  101  102   CO2 22 - 32 mmol/L 20  23  25   Calcium 8.9 - 10.3 mg/dL 10.3  10.4  8.6   Total Protein 6.5 - 8.1 g/dL 7.2  6.8    Total Bilirubin 0.3 - 1.2 mg/dL 0.5  0.4    Alkaline Phos 38 - 126 U/L 136  153    AST 15 - 41 U/L 17  29    ALT 0 - 44 U/L 19  30      Magnesium at 1.0, phos still in process.  Update: Labs were drawn peripherally and then the patient was escorted to the infusion room at [facility] to begin receiving IV bolus. Received call from lab about critical potassium level of 6.8. ER charge RN notified of need for patient to come to ER for urgent evaluation. Patient escorted from the infusion room at [facility] in a wheelchair to the ER. Patient in no acute distress at this time.

## 2022-05-03 NOTE — Assessment & Plan Note (Signed)
Patient presented with hyperkalemia. In ED she received insulin/D50 and IVF bolus of 1 Liter. Follow up Bmet with K 5.2, Cr 1.49  Plan Continue IV hydration  Antimotility agent to slow high volume ileostomy output.

## 2022-05-03 NOTE — ED Provider Notes (Signed)
[place] [facility] Provider Note   CSN: [id number] Arrival date & time: 05/03/22  1653     History  Chief Complaint  Patient presents with   Abnormal Lab    K 6.8    Maria Romero is a 72 y.o. female.   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   Patient recently had surgery on January 10.  She had a laparoscopy and hysterectomy.  Patient was in the oncology office today to have her staples removed.  She had been feeling somewhat weak this morning and her blood pressure was low when she checked it at home.  She mentioned this at the cancer center today and so they gave her IV fluids and check some labs.  Patient's potassium came back elevated with a level of 6.8.  Patient was sent to the ED for further evaluation.  Patient states she is not taking any potassium supplements.  She still has been urinating.  She has never had this problem before.  Home Medications Prior to Admission medications   Medication Sig Start Date End Date Taking? Authorizing Provider  acyclovir (ZOVIRAX) 200 MG capsule Take 1 capsule (200 mg total) by mouth 3 (three) times daily. Patient not taking: Reported on 05/03/2022 03/23/22   Volanda Napoleon, MD  amLODipine (NORVASC) 5 MG tablet Take 5 mg by mouth daily. 02/02/22   [provider]  atorvastatin (LIPITOR) 40 MG tablet TAKE ONE TABLET BY MOUTH EVERY NIGHT AT BEDTIME 12/29/21   Hali Marry, MD  Calcium-Vitamin D-Vitamin K (VIACTIV PO) Take 1 tablet by mouth in the morning and at bedtime.    [provider]  cholecalciferol (VITAMIN D3) 25 MCG (1000 UNIT) tablet Take 1,000 Units by mouth daily.    [provider]  dexamethasone (DECADRON) 4 MG tablet Take 2 tablets by mouth starting the day after chemotherapy for 3 days. Take with food.  02/01/22   Hughie Closs, PA-C  lidocaine-prilocaine (EMLA) cream Apply 1 Application topically as needed (prior to port access).    [provider]  magic mouthwash (nystatin, lidocaine, diphenhydrAMINE, alum & mag hydroxide) suspension Swish and spit 5 mLs 4 (four) times daily as needed for mouth pain. 02/01/22   Hughie Closs, PA-C  magnesium oxide (MAG-OX) 400 (240 Mg) MG tablet Take 0.5 tablets (200 mg total) by mouth daily. 04/25/22   Cross, Lenna Sciara D, NP  omeprazole (PRILOSEC) 20 MG capsule Take 1 capsule (20 mg total) by mouth daily. 02/01/22   Hughie Closs, PA-C  ondansetron (ZOFRAN) 8 MG tablet Take 1 tablet (8 mg total) by mouth every 8 (eight) hours as needed for nausea or vomiting. Start on the third day after chemotherapy. 02/01/22   Hughie Closs, PA-C  prochlorperazine (COMPAZINE) 10 MG tablet Take 1 tablet (10 mg total) by mouth every 6 (six) hours as needed for nausea or vomiting. 02/01/22   Hughie Closs, PA-C  rivaroxaban (XARELTO) 10 MG TABS tablet Take 1 tablet (10 mg total) by mouth daily. 04/25/22   Cross, Lenna Sciara D, NP  senna-docusate (SENOKOT-S) 8.6-50 MG tablet Take 2 tablets by mouth at bedtime. For AFTER surgery, do not take if having diarrhea 04/08/22   Joylene John D, NP  traMADol (ULTRAM) 50 MG tablet Take 1 tablet (50 mg total) by mouth every 6 (six) hours as needed for severe  pain. For AFTER surgery only, do not take and drive 56/31/49   Cross, Lenna Sciara D, NP  valsartan (DIOVAN) 160 MG tablet Take 160 mg by mouth daily.    [provider]      Allergies    Fluvastatin sodium    Review of Systems   Review of Systems  Physical Exam Updated Vital Signs BP (!) 94/55   Pulse 86   Temp 97.7 F (36.5 C) (Oral)   Resp (!) 26   SpO2 100%  Physical Exam Vitals and nursing note reviewed.  Constitutional:      Appearance: She is well-developed. She is not diaphoretic.  HENT:     Head: Normocephalic and atraumatic.      Right Ear: External ear normal.     Left Ear: External ear normal.  Eyes:     General: No scleral icterus.       Right eye: No discharge.        Left eye: No discharge.     Conjunctiva/sclera: Conjunctivae normal.  Neck:     Trachea: No tracheal deviation.  Cardiovascular:     Rate and Rhythm: Normal rate and regular rhythm.  Pulmonary:     Effort: Pulmonary effort is normal. No respiratory distress.     Breath sounds: Normal breath sounds. No stridor. No wheezing or rales.  Abdominal:     General: Bowel sounds are normal. There is no distension.     Palpations: Abdomen is soft.     Tenderness: There is no abdominal tenderness. There is no guarding or rebound.     Comments: Colostomy bag in place  Musculoskeletal:        General: No tenderness or deformity.     Cervical back: Neck supple.  Skin:    General: Skin is warm and dry.     Findings: No rash.  Neurological:     General: No focal deficit present.     Mental Status: She is alert.     Cranial Nerves: No cranial nerve deficit, dysarthria or facial asymmetry.     Sensory: No sensory deficit.     Motor: No abnormal muscle tone or seizure activity.     Coordination: Coordination normal.  Psychiatric:        Mood and Affect: Mood normal.     ED Results / Procedures / Treatments   Labs (all labs ordered are listed, but only abnormal results are displayed) Labs Reviewed  BASIC METABOLIC PANEL - Abnormal; Notable for the following components:      Result Value   Sodium 133 (*)    Potassium 5.2 (*)    CO2 16 (*)    Glucose, Bld 114 (*)    BUN 46 (*)    Creatinine, Ser 1.49 (*)    Calcium 8.7 (*)    GFR, Estimated 37 (*)    All other components within normal limits  LACTIC ACID, PLASMA - Abnormal; Notable for the following components:   Lactic Acid, Venous 2.5 (*)    All other components within normal limits  URINALYSIS, ROUTINE W REFLEX MICROSCOPIC - Abnormal; Notable for the following components:   Color, Urine  STRAW (*)    Glucose, UA 150 (*)    Leukocytes,Ua TRACE (*)    Bacteria, UA RARE (*)    All other components within normal limits  CULTURE, BLOOD (ROUTINE X 2)  CULTURE, BLOOD (ROUTINE X 2)  LACTIC ACID, PLASMA    EKG EKG Interpretation  Date/Time:  Tuesday May 03 2022 17:36:13 EST Ventricular Rate:  75 PR Interval:  122 QRS Duration: 101 QT Interval:  389 QTC Calculation: 435 R Axis:   20 Text Interpretation: Sinus rhythm No significant change since last tracing Atrial premature complex Abnormal R-wave progression, early transition Confirmed by Dorie Rank [phone]) on 05/03/2022 6:21:52 PM  Radiology No results found.  Procedures .Critical Care  Performed by: Dorie Rank, MD Authorized by: Dorie Rank, MD   Critical care provider statement:    Critical care time (minutes):  30   Critical care was time spent personally by me on the following activities:  Development of treatment plan with patient or surrogate, discussions with consultants, evaluation of patient's response to treatment, examination of patient, ordering and review of laboratory studies, ordering and review of radiographic studies, ordering and performing treatments and interventions, pulse oximetry, re-evaluation of patient's condition and review of old charts     Medications Ordered in ED Medications  sodium chloride 0.9 % bolus 1,000 mL (0 mLs Intravenous Stopped 05/03/22 1835)  albuterol (PROVENTIL) (2.5 MG/3ML) 0.083% nebulizer solution 5 mg (5 mg Nebulization Given 05/03/22 1735)  insulin aspart (novoLOG) injection 5 Units (5 Units Intravenous Given 05/03/22 1731)    And  dextrose 50 % solution 50 mL (50 mLs Intravenous Given 05/03/22 1729)  sodium bicarbonate injection 25 mEq (25 mEq Intravenous Given 05/03/22 1729)  sodium chloride 0.9 % bolus 1,000 mL (1,000 mLs Intravenous New Bag/Given 05/03/22 1837)    ED Course/ Medical Decision Making/ A&P Clinical Course as of 05/03/22 2056  Tue May 03, 2022   1829 Pressure still soft.  Labs reviewed from earlier today.  Patient did have a slight leukocytosis.  Will add on lactic acid level and blood cultures.  Will give additional fluid bolus as her initial metabolic panel did show an AKI patient to her hyperkalemia [JK]  1836 Blood pressure improved now 149.  Will continue to monitor closely [JK]  1905 Patient's repeat potassium level has improved to 5.2.  Persistent AKI and metabolic acidosis with BUN and creatinine elevated at 46 and 1.49 [JK]  2001 Lactic acid, plasma(!!) Lactic acid level slightly elevated 2.5 [JK]  2001 Urinalysis not suggestive of infection [JK]  2030 Case discussed with Dr. Linda Hedges regarding admission [JK]    Clinical Course User Index [JK] Dorie Rank, MD                             Medical Decision Making Parental diagnosis includes but not limited to acute renal failure, hyperkalemia, dehydration and infection  Amount and/or Complexity of Data Reviewed Labs: ordered. Decision-making details documented in ED Course.  Risk OTC drugs. Prescription drug management. Drug therapy requiring intensive monitoring for toxicity. Decision regarding hospitalization.   Patient presented to the ED for evaluation of hypotension and hyperkalemia.  Patient has complex history of ovarian cancer.  She has an ileostomy in place.  Patient noted to be hypotensive in the ED oncology office.  Patient has had increased ileostomy output suspect that was the cause of her hypotension and dehydration.  Patient's AKI resulted in hyperkalemia.  That has improved with treatment.  Will send off lactic acid level but no focal signs of infection at this time.  Patient's potassium level was improving with treatment for hyperkalemia.  I will consult with the medical service for admission.  Dr. Ernestina Patches GYN oncology will continue to follow along.        Final Clinical Impression(s) /  ED Diagnoses Final diagnoses:  Hyperkalemia  AKI (acute kidney  injury) [facility])    Rx / DC Orders ED Discharge Orders     None         Dorie Rank, MD 05/03/22 2056

## 2022-05-03 NOTE — Assessment & Plan Note (Signed)
Per GYN-Oncology

## 2022-05-03 NOTE — Progress Notes (Signed)
Maria John, NP and [name] came to escort patient to ED due to K+ level. Patient discharged from infusion with port accessed, IV fluids running to gravity.

## 2022-05-03 NOTE — Assessment & Plan Note (Signed)
BP low at presentation.  Plan Volume resuscitate  Hold antihypertensives until BP 110/70 or greater

## 2022-05-03 NOTE — Progress Notes (Unsigned)
Orders placed for 1 liter of normal saline over 1 hour.

## 2022-05-04 ENCOUNTER — Encounter: Payer: Self-pay | Admitting: Hematology & Oncology

## 2022-05-04 DIAGNOSIS — E860 Dehydration: Secondary | ICD-10-CM | POA: Diagnosis present

## 2022-05-04 DIAGNOSIS — E872 Acidosis, unspecified: Secondary | ICD-10-CM | POA: Diagnosis not present

## 2022-05-04 DIAGNOSIS — I10 Essential (primary) hypertension: Secondary | ICD-10-CM | POA: Diagnosis not present

## 2022-05-04 DIAGNOSIS — N179 Acute kidney failure, unspecified: Secondary | ICD-10-CM | POA: Diagnosis not present

## 2022-05-04 DIAGNOSIS — E875 Hyperkalemia: Secondary | ICD-10-CM | POA: Diagnosis not present

## 2022-05-04 LAB — BASIC METABOLIC PANEL
Anion gap: 6 (ref 5–15)
BUN: 30 mg/dL — ABNORMAL HIGH (ref 8–23)
CO2: 18 mmol/L — ABNORMAL LOW (ref 22–32)
Calcium: 8.2 mg/dL — ABNORMAL LOW (ref 8.9–10.3)
Chloride: 113 mmol/L — ABNORMAL HIGH (ref 98–111)
Creatinine, Ser: 1.08 mg/dL — ABNORMAL HIGH (ref 0.44–1.00)
GFR, Estimated: 54 mL/min — ABNORMAL LOW (ref >60)
Glucose, Bld: 89 mg/dL (ref 70–99)
Potassium: 4.7 mmol/L (ref 3.5–5.1)
Sodium: 137 mmol/L (ref 135–145)

## 2022-05-04 MED ORDER — MAGNESIUM SULFATE 2 GM/50ML IV SOLN
2.0000 g | Freq: Once | INTRAVENOUS | Status: AC
  Administered 2022-05-04: 2 g via INTRAVENOUS
  Filled 2022-05-04: qty 50

## 2022-05-04 MED ORDER — MAGNESIUM SULFATE IN D5W 1-5 GM/100ML-% IV SOLN
1.0000 g | Freq: Once | INTRAVENOUS | Status: AC
  Administered 2022-05-04: 1 g via INTRAVENOUS
  Filled 2022-05-04: qty 100

## 2022-05-04 MED ORDER — LOPERAMIDE HCL 2 MG PO CAPS
2.0000 mg | ORAL_CAPSULE | Freq: Three times a day (TID) | ORAL | Status: DC
  Administered 2022-05-04 – 2022-05-05 (×4): 2 mg via ORAL
  Filled 2022-05-04 (×4): qty 1

## 2022-05-04 MED ORDER — SODIUM CHLORIDE 0.9% FLUSH
10.0000 mL | Freq: Two times a day (BID) | INTRAVENOUS | Status: DC
  Administered 2022-05-04 – 2022-05-05 (×3): 10 mL

## 2022-05-04 MED ORDER — CHLORHEXIDINE GLUCONATE CLOTH 2 % EX PADS
6.0000 | MEDICATED_PAD | Freq: Every day | CUTANEOUS | Status: DC
  Administered 2022-05-04 – 2022-05-05 (×2): 6 via TOPICAL

## 2022-05-04 MED ORDER — MAGNESIUM SULFATE 50 % IJ SOLN: 3.0000 g | Freq: Once | INTRAVENOUS | Status: DC

## 2022-05-04 MED ORDER — SODIUM CHLORIDE 0.9% FLUSH
10.0000 mL | INTRAVENOUS | Status: DC | PRN
  Administered 2022-05-05: 10 mL

## 2022-05-04 NOTE — Progress Notes (Signed)
  Transition of Care [facility]) Screening Note   Patient Details  Name: Maria Romero Date of Birth: [birth year]   Transition of Care [facility]) CM/SW Contact:    Vassie Moselle, LCSW Phone Number: 05/04/2022, 11:29 AM    Transition of Care Department [facility]) has reviewed patient and no TOC needs have been identified at this time. We will continue to monitor patient advancement through interdisciplinary progression rounds. If new patient transition needs arise, please place a TOC consult.

## 2022-05-04 NOTE — Progress Notes (Signed)
Mobility Specialist - Progress Note   05/04/22 1522  Mobility  Activity Ambulated independently in hallway  Level of Assistance Independent  Assistive Device None  Distance Ambulated (ft) 500 ft  Activity Response Tolerated well  Mobility Referral Yes  $Mobility charge 1 Mobility   Pt received in bed and agreeable to mobility. No complaints during session. Pt to bed after session with all needs met.   [facility]

## 2022-05-04 NOTE — Progress Notes (Signed)
Mobility Specialist - Progress Note   05/04/22 1052  Mobility  Activity Ambulated with assistance in hallway  Level of Assistance Modified independent, requires aide device or extra time  Assistive Device Other (Comment) (IV Pole)  Distance Ambulated (ft) 500 ft  Activity Response Tolerated well  Mobility Referral Yes  $Mobility charge 1 Mobility   Pt received in bed and agreeable to mobility. No complaints during session. Pt to bed after session with all needs met & family in room.  [facility]

## 2022-05-04 NOTE — Progress Notes (Signed)
Gynecologic Oncology Progress Note  Maria Romero is 72 year old female with advanced ovarian cancer who received neoadjuvant chemotherapy followed by interval debulking surgery listed below. She is status post diagnostic laparoscopy, exploratory laparotomy with lysis of adhesions for approximately 45 minutes, omentectomy including mobilization of the splenic flexure, radical tumor debulking with en bloc resection of bilateral adnexa, uterus, cervix and rectosigmoid colon, end-to-end colonic reanastomosis, diverting loop ileostomy, cystoscopy on 04/20/2022 with Dr. Jeral Pinch. Her post-operative course while inpatient was uneventful. She was discharged home on 04/25/2022.   She presented to the office on 05/03/2022 for post-operative follow up and staple removal. Vitals at the visit included 83/58 (repeat 95/52), pulse 88, respirations 20, temp 97.0. Her weight was noted at 111 pounds from around 129 to 130 pounds on admission to the hospital for surgery. She reported overall not feeling well and having intermittent dizziness. Her ostomy had been draining watery stool and she had challenges with appliance adherence over the weekend. Given her symptoms and BP at her visit on 1/23, stat labs ordered along with an IV bolus to be administered in the [facility] infusion room.   Her labs returned with K+ of 6.8, Creatinine 1.79, BUN 46, WBC 11.5, Hgb 12.2, Hct 37.1, ANC 9.2, Mag 1.0, Na+ 132. Given these findings, she was escorted from the infusion room to the ER for further evaluation and management. In the ER, she received insulin/D50 and IVF bolus of 1 Liter with follow up Bmet with K 5.2, Cr 1.49.   Subjective: Patient reports feeling better this am. Tolerating diet with no nausea or emesis. She reports having a great appetite this am. Ambulating to the bathroom with no lightheadedness or dizziness reported. No pain reported. Denies chest pain, dyspnea. Ostomy output has thickened up. No leaking from the bag.  Daughter at the bedside. Patient excited to have a latte from [place] this am. She would like to meet with the ostomy RN while she is here.   Objective: Vital signs in last 24 hours: Temp:  [97 F (36.1 C)-97.8 F (36.6 C)] 97.7 F (36.5 C) (01/24 0700) Pulse Rate:  [65-86] 65 (01/24 0700) Resp:  [11-26] 17 (01/24 0700) BP: (86-113)/(49-62) 104/58 (01/24 0700) SpO2:  [81 %-100 %] 100 % (01/24 0700) Weight:  [111 lb (50.3 kg)] 111 lb (50.3 kg) (01/23 1502) Last BM Date : 05/04/22  Intake/Output from previous day: 01/23 0701 - 01/24 0700 In: 1000 [IV Piggyback:1000] Out: 800 [Urine:700; Stool:100]  Physical Examination: General: alert, cooperative, no distress, and appears to be feeling better Resp: clear to auscultation bilaterally Cardio: regular rate and rhythm, S1, S2 normal, no murmur, click, rub or gallop GI: incision: midline incision with steri strips (staples removed 1/23) without drainage or erythema and abdomen soft, hypoactive bowel sounds, ileostomy with more solid stool compared with 1/23 assessment, flatus in the bag Extremities: extremities normal, atraumatic, no cyanosis or edema  Labs: WBC/Hgb/Hct/Plts:  11.5/12.2/37.1/374 (01/23 1544) BUN/Cr/glu/ALT/AST/amyl/lip:  30/1.08/--/--/--/--/-- (01/24 0425)  Assessment: 72 y.o. currently admitted due to hyperkalemia, AKI. Prior to admission, she underwent diagnostic laparoscopy, exploratory laparotomy with lysis of adhesions for approximately 45 minutes, omentectomy including mobilization of the splenic flexure, radical tumor debulking with en bloc resection of bilateral adnexa, uterus, cervix and rectosigmoid colon, end-to-end colonic reanastomosis, diverting loop ileostomy, cystoscopy on 04/20/2022 with Dr. Jeral Pinch for advanced ovarian cancer.  Pain: No pain reported. PRN medications ordered if needed.  Heme: Hgb 12.2 and Hct 37.1 on 05/03/2022. Overall stable- values on 1/23 likely  higher compared with previous  given hemoconcentration related to dehydration. Currently on prophylactic xarelto post-operatively for a total of 4 weeks.   ID: WBC 11.5 on 05/03/22-probable elevation in WBC related to hemoconcentration/dehyration. Afebrile. UA obtained in the ER.   CV: Hypotensive at visit at [facility] on 05/03/22. Pressures improving with last at 104/58. Pulse 60s-80s. EKG performed in the ER yesterday- sinus rhythm, atrial premature complex with no change since last EKG. Currently on telemetry.  GI:  Tolerating PO: yes. High output ostomy- pt to take imodium daily, can add in fibercon per Dr. Berline Lopes. Per Dr. Tana Coast, plan to titrate imodium use.  GU: Presented to ER with AKI. Creatinine 1.08 this am from 1.79 on 05/03/22. Maintain IVF today per Dr. Tana Coast.     FEN: ER evaluation due to K+ of 6.8 on 05/03/22 [facility] labs. K+ 4.7 this am. S/P insulin/D50 and IVF bolus of 1 Liter in ER on 05/03/22. Na+ 137 this am.  Prophylaxis: On xarelto per Dr. Berline Lopes for DVT prop post-op for a total of 4 weeks after surgery listed above on 04/20/22.  Plan: Continue plan of care per Dr. Tana Coast, Hospitalist Team. Appreciate management. Maintain IVF today. Imodium administration with titration based on output. Plan for possible discharge tomorrow if still doing well   LOS: 0 days    [name] 05/04/2022, 7:51 AM

## 2022-05-04 NOTE — Progress Notes (Signed)
[facility]                                                                              Maria Romero, is a 72 y.o. female, DOB - 09-Aug-1950, WLN:[id number] Admit date - 05/03/2022    Outpatient Primary MD for the patient is Hali Marry, MD  LOS - 0  days  Chief Complaint  Patient presents with   Abnormal Lab    K 6.8       Brief summary   Patient is a 72 year old female, recently diagnosed with [name], underwent TAH/BSO, tumor debulking and bowel resection with creation of an ileostomy 04/20/22. She has had high volume output of liquid stool - approximately a liter or more per day. She presented to GYN-onc clinic today for suture removal. Due to her report of weakness stat labs were ordered which revealed hyperkalemia to 6.8 with AKI with BUN 46, Cr 1.79. she was transferred to [facility] for continued evaluation and treatment  In ED, BP 94/56, patient was given IV fluids and admitted for further workup.   Assessment & Plan    Principal Problem:   Hyperkalemia -Presented with K 6.8, profound dehydration due to high output ileostomy -Treated with insulin/D50, IV fluids, potassium has normalized 4.7  Active Problems:   AKI (acute kidney injury) ([place]) with lactic acidosis, profound dehydration -Likely secondary to high output ileostomy, difficult to balance I's and O's -Presented with creatinine of 1.79, lactic acid 2.5 -Continue IV fluid hydration, creatinine improving 1.0     Ovarian cancer (HCC) status post debulking surgery and creation of diverting loop ileostomy -Management per gyn-oncology -Placed on Imodium 3 times daily and titrate based on the output, explained to the patient -On Xarelto for DVT prophylaxis post-op for a total of 4 weeks after surgery     Essential hypertension -BP soft, amlodipine was decreased to 5 mg daily, will hold amlodipine as BP remains soft  Hyperlipidemia -Continue Lipitor  GERD -Continue  PPI  Hypomagnesemia -Replaced IV, will repeat magnesium in a.m.   Underweight Estimated body mass index is 19.66 kg/m as calculated from the following:   Height as of an earlier encounter on 05/03/22: '5\' 3"'$  (1.6 m).   Weight as of an earlier encounter on 05/03/22: 50.3 kg.  Code Status: Full CODE STATUS DVT Prophylaxis:  rivaroxaban (XARELTO) tablet 10 mg Start: 05/03/22 2200 rivaroxaban (XARELTO) tablet 10 mg   Level of Care: Level of care: Telemetry Family Communication: Updated patient's daughter at the bedside Disposition Plan:      Remains inpatient appropriate: Possible DC home in a.m. if output improving with Imodium   Procedures:  None  Consultants:   Gyn onc  Antimicrobials: none   Medications  amLODipine  5 mg Oral Daily   atorvastatin  40 mg Oral QHS   Chlorhexidine Gluconate Cloth  6 each Topical Daily   irbesartan  150 mg Oral Daily   loperamide  2 mg Oral TID   magnesium oxide  200 mg Oral Daily   pantoprazole  40 mg Oral Daily   rivaroxaban  10 mg Oral QHS   sodium chloride flush  10-40  mL Intracatheter Q12H      Subjective:   Maria Romero was seen and examined today.  Ambulating in the room, feeling somewhat better today.  High output ileostomy, feels output getting better after starting Imodium. Patient denies dizziness, chest pain, shortness of breath, abdominal pain, N/V.  Daughter at the bedside Objective:   Vitals:   05/04/22 0030 05/04/22 0041 05/04/22 0249 05/04/22 0700  BP: (!) 98/57  113/62 (!) 104/58  Pulse: 70  67 65  Resp: '15  17 17  '$ Temp:  97.8 F (36.6 C) 97.6 F (36.4 C) 97.7 F (36.5 C)  TempSrc:  Oral Oral Oral  SpO2: 99%  100% 100%    Intake/Output Summary (Last 24 hours) at 05/04/2022 1109 Last data filed at 05/04/2022 0900 Gross per 24 hour  Intake 1360 ml  Output 800 ml  Net 560 ml     Wt Readings from Last 3 Encounters:  05/03/22 50.3 kg  04/21/22 59.7 kg  04/12/22 57.2 kg     Exam General: Alert and  oriented x 3, NAD Cardiovascular: S1 S2 auscultated,  RRR Respiratory: Clear to auscultation bilaterally, no wheezing, rales or rhonchi Gastrointestinal: Soft, nontender, nondistended, + bowel sounds, ileostomy with light brownish liquidy stools Ext: no pedal edema bilaterally Neuro: Strength 5/5 upper and lower extremities bilaterally Skin: No rashes Psych: Normal affect and demeanor, alert and oriented x3     Data Reviewed:  I have personally reviewed following labs    CBC Lab Results  Component Value Date   WBC 11.5 (H) 05/03/2022   RBC 3.79 (L) 05/03/2022   HGB 12.2 05/03/2022   HCT 37.1 05/03/2022   MCV 97.9 05/03/2022   MCH 32.2 05/03/2022   PLT 374 05/03/2022   MCHC 32.9 05/03/2022   RDW 17.6 (H) 05/03/2022   LYMPHSABS 1.3 05/03/2022   MONOABS 0.8 05/03/2022   EOSABS 0.1 05/03/2022   BASOSABS 0.1 10/30/5748     Last metabolic panel Lab Results  Component Value Date   NA 137 05/04/2022   K 4.7 05/04/2022   CL 113 (H) 05/04/2022   CO2 18 (L) 05/04/2022   BUN 30 (H) 05/04/2022   CREATININE 1.08 (H) 05/04/2022   GLUCOSE 89 05/04/2022   GFRNONAA 54 (L) 05/04/2022   GFRAA 75 10/24/2019   CALCIUM 8.2 (L) 05/04/2022   PHOS 5.1 (H) 05/03/2022   PROT 7.2 05/03/2022   ALBUMIN 4.2 05/03/2022   BILITOT 0.5 05/03/2022   ALKPHOS 136 (H) 05/03/2022   AST 17 05/03/2022   ALT 19 05/03/2022   ANIONGAP 6 05/04/2022    CBG (last 3)  No results for input(s): "GLUCAP" in the last 72 hours.    Coagulation Profile: No results for input(s): "INR", "PROTIME" in the last 168 hours.   Radiology Studies: I have personally reviewed the imaging studies  No results found.     Estill Cotta M.D. [facility] 05/04/2022, 11:09 AM  Available via Epic secure chat 7am-7pm After 7 pm, please refer to night coverage provider listed on amion.

## 2022-05-05 ENCOUNTER — Encounter: Payer: Self-pay | Admitting: Hematology & Oncology

## 2022-05-05 DIAGNOSIS — C569 Malignant neoplasm of unspecified ovary: Secondary | ICD-10-CM | POA: Diagnosis not present

## 2022-05-05 DIAGNOSIS — E875 Hyperkalemia: Secondary | ICD-10-CM | POA: Diagnosis not present

## 2022-05-05 DIAGNOSIS — N179 Acute kidney failure, unspecified: Secondary | ICD-10-CM | POA: Diagnosis not present

## 2022-05-05 LAB — BASIC METABOLIC PANEL
Anion gap: 5 (ref 5–15)
BUN: 15 mg/dL (ref 8–23)
CO2: 20 mmol/L — ABNORMAL LOW (ref 22–32)
Calcium: 8.3 mg/dL — ABNORMAL LOW (ref 8.9–10.3)
Chloride: 112 mmol/L — ABNORMAL HIGH (ref 98–111)
Creatinine, Ser: 0.82 mg/dL (ref 0.44–1.00)
GFR, Estimated: >60 mL/min (ref >60)
Glucose, Bld: 108 mg/dL — ABNORMAL HIGH (ref 70–99)
Potassium: 4 mmol/L (ref 3.5–5.1)
Sodium: 137 mmol/L (ref 135–145)

## 2022-05-05 LAB — MAGNESIUM: Magnesium: 1.6 mg/dL — ABNORMAL LOW (ref 1.7–2.4)

## 2022-05-05 MED ORDER — MAGNESIUM OXIDE -MG SUPPLEMENT 400 (240 MG) MG PO TABS: 400.0000 mg | ORAL_TABLET | Freq: Every day | ORAL | 3 refills | Status: DC

## 2022-05-05 MED ORDER — MAGNESIUM SULFATE 2 GM/50ML IV SOLN
2.0000 g | Freq: Once | INTRAVENOUS | Status: AC
  Administered 2022-05-05: 2 g via INTRAVENOUS
  Filled 2022-05-05: qty 50

## 2022-05-05 MED ORDER — MAGNESIUM OXIDE -MG SUPPLEMENT 400 (240 MG) MG PO TABS
400.0000 mg | ORAL_TABLET | Freq: Every day | ORAL | Status: DC
  Administered 2022-05-05: 400 mg via ORAL
  Filled 2022-05-05: qty 1

## 2022-05-05 MED ORDER — MAGNESIUM SULFATE 50 % IJ SOLN: 2.0000 g | Freq: Once | INTRAVENOUS | Status: DC

## 2022-05-05 MED ORDER — LOPERAMIDE HCL 2 MG PO TABS: 2.0000 mg | ORAL_TABLET | Freq: Two times a day (BID) | ORAL | 0 refills | Status: DC

## 2022-05-05 MED ORDER — HEPARIN SOD (PORK) LOCK FLUSH 100 UNIT/ML IV SOLN
500.0000 [IU] | INTRAVENOUS | Status: AC | PRN
  Administered 2022-05-05: 500 [IU]

## 2022-05-05 NOTE — Progress Notes (Signed)
Mobility Specialist - Progress Note   05/05/22 1152  Mobility  Activity Ambulated independently in hallway  Level of Assistance Independent  Assistive Device None  Distance Ambulated (ft) 280 ft  Activity Response Tolerated well  Mobility Referral Yes  $Mobility charge 1 Mobility   Pt received in bed and agreeable to mobility. No complaints during session. Pt to EOB after session with all needs met.   [facility]

## 2022-05-05 NOTE — Discharge Instructions (Addendum)
You will need to monitor your ostomy output and take imodium 1-3 times a day based on the consistency of ostomy output.  Continue with below post-op instructions:  GYN Oncology AFTER SURGERY INSTRUCTIONS  Return to work: 4-6 weeks if applicable  You are to continue the prescribed blood thinner (xarelto) after surgery for a total of 4 weeks to prevent blood clots. You will need to take this once a day at the same time each day. Continue to avoid use of ibuprofen and other NSAIDs after surgery since you are on a blood thinner.  Activity: 1. Be up and out of the bed during the day. Take a nap if needed. You may walk up steps but be careful and use the hand rail. Stair climbing will tire you more than you think, you may need to stop part way and rest.  2. No lifting or straining for 6 weeks over 10 pounds. No pushing, pulling, straining for 6 weeks.  3. No driving for 5 -10 days until cleared to drive. Do not drive if you are taking narcotic pain medicine and make sure that your reaction time has returned.  4. You can shower as soon as the next day after surgery. Shower daily. Use your regular soap and water (not directly on the incision) and pat your incision(s) dry afterwards; don't rub. No tub baths or submerging your body in water until cleared by your surgeon. If you have the soap that was given to you by pre-surgical testing that was used before surgery, you do not need to use it afterwards because this can irritate your incisions.  5. No sexual activity and nothing in the vagina for 8-10 weeks.  6. You may experience a small amount of clear drainage from your incision, which is normal. If the drainage persists, increases, or changes color please call the office.  7. You may experience vaginal spotting after surgery or around the 6-8 week mark from surgery when the stitches at the top of the vagina begin to dissolve. The spotting is normal but if you experience heavy bleeding, call our  office.  8. Take Tylenol first for pain if you are able to take these medications and only use narcotic pain medication for severe pain not relieved by the Tylenol. Monitor your Tylenol intake to a max of 4,000 mg in a 24 hour period.  Diet: 1. Low sodium Heart Healthy Diet is recommended but you are cleared to resume your normal (before surgery) diet after your procedure.  Wound Care: 1. Keep clean and dry. Shower daily.  Reasons to call the Doctor: Fever - Oral temperature greater than 100.4 degrees Fahrenheit Foul-smelling vaginal discharge Difficulty urinating Nausea and vomiting Increased pain at the site of the incision that is unrelieved with pain medicine. Difficulty breathing with or without chest pain New calf pain especially if only on one side Sudden, continuing increased vaginal bleeding with or without clots.  Contacts: For questions or concerns you should contact: Dr. Jeral Pinch at [phone] Joylene John, NP at [phone] After Hours: call [phone] and have the GYN Oncologist paged/contacted (after 5 pm or on the weekends). Messages sent via mychart are for non-urgent matters and are not responded to after hours so for urgent needs, please call the after hours number.

## 2022-05-05 NOTE — Progress Notes (Signed)
Gynecologic Oncology Progress Note  Maria Romero is 72 year old female with advanced ovarian cancer who received neoadjuvant chemotherapy followed by interval debulking surgery listed below. She is status post diagnostic laparoscopy, exploratory laparotomy with lysis of adhesions for approximately 45 minutes, omentectomy including mobilization of the splenic flexure, radical tumor debulking with en bloc resection of bilateral adnexa, uterus, cervix and rectosigmoid colon, end-to-end colonic reanastomosis, diverting loop ileostomy, cystoscopy on 04/20/2022 with Dr. Jeral Pinch. Her post-operative course while inpatient was uneventful. She was discharged home on 04/25/2022.   She presented to the office on 05/03/2022 for post-operative follow up and staple removal. Vitals at the visit included 83/58 (repeat 95/52), pulse 88, respirations 20, temp 97.0. Her weight was noted at 111 pounds from around 129 to 130 pounds on admission to the hospital for surgery. She reported overall not feeling well and having intermittent dizziness. Her ostomy had been draining watery stool and she had challenges with appliance adherence over the weekend. Given her symptoms and BP at her visit on 1/23, stat labs ordered along with an IV bolus to be administered in the [facility] infusion room.   Her labs returned with K+ of 6.8, Creatinine 1.79, BUN 46, WBC 11.5, Hgb 12.2, Hct 37.1, ANC 9.2, Mag 1.0, Na+ 132. Given these findings, she was escorted from the infusion room to the ER for further evaluation and management. In the ER, she received insulin/D50 and IVF bolus of 1 Liter with follow up Bmet with K 5.2, Cr 1.49.   Subjective: Patient reports doing well this am. Continue to tolerate diet with no nausea or emesis. Ambulating to the bathroom and in the halls with no lightheadedness or dizziness reported. No pain reported except for occasional twinges in the lower right abdomen. Denies chest pain, dyspnea. Ostomy output has  thickened up. No leaking from the bag. Discussed challenges she had with the ostomy appliance not sticking after discharge from the hospital last admission. No other concerns voiced.   Objective: Vital signs in last 24 hours: Temp:  [97.4 F (36.3 C)-98.2 F (36.8 C)] 97.8 F (36.6 C) (01/25 0439) Pulse Rate:  [66-76] 66 (01/25 0439) Resp:  [16-17] 16 (01/25 0439) BP: (94-111)/(54-62) 111/58 (01/25 0439) SpO2:  [100 %] 100 % (01/25 0439) Last BM Date : 05/05/22  Intake/Output from previous day: 01/24 0701 - 01/25 0700 In: 1210 [P.O.:1200; I.V.:10] Out: 2 [Stool:2]  Physical Examination: General: alert, cooperative, and no distress Resp: clear to auscultation bilaterally Cardio: regular rate and rhythm, S1, S2 normal, no murmur, click, rub or gallop GI: incision: midline incision with steri strips (staples removed 1/23) without drainage or erythema and abdomen soft, active bowel sounds, ileostomy with more solid stool compared with 1/24 assessment, flatus in the bag Extremities: extremities normal, atraumatic, no cyanosis or edema  Labs: Mag 1.6, Bmet pending  Assessment: 72 y.o. currently admitted due to hyperkalemia, AKI, high output ileostomy. Prior to admission, she underwent diagnostic laparoscopy, exploratory laparotomy with lysis of adhesions for approximately 45 minutes, omentectomy including mobilization of the splenic flexure, radical tumor debulking with en bloc resection of bilateral adnexa, uterus, cervix and rectosigmoid colon, end-to-end colonic reanastomosis, diverting loop ileostomy, cystoscopy on 04/20/2022 with Dr. Jeral Pinch for advanced ovarian cancer.  Pain: No pain reported at this time. PRN medications ordered if needed.  Heme: Hgb 12.2 and Hct 37.1 on 05/03/2022. Overall stable- values on 1/23 likely higher compared with previous given hemoconcentration related to dehydration. Currently on prophylactic xarelto post-operatively for a total of  4 weeks.    ID: WBC 11.5 on 05/03/22-probable elevation in WBC related to hemoconcentration/dehyration. Afebrile. UA obtained in the ER.   CV: Hypotensive at visit at [facility] on 05/03/22. Pressures improved. Pulse in 60s-70s. EKG performed in the ER 1/23- sinus rhythm, atrial premature complex with no change since last EKG. Currently on telemetry.  GI:  Tolerating PO: yes. High output ostomy- pt to take imodium several times daily (titration based on consistency of output), can add in fibercon per Dr. Berline Lopes.   GU: Presented to ER with AKI. Am Bmet pending. Creatinine 1.08 1/24 am from 1.79 on 05/03/22.   FEN: ER evaluation due to K+ of 6.8 on 05/03/22 [facility] labs. K+ 4.7 1/24 am. S/P insulin/D50 and IVF bolus of 1 Liter in ER on 05/03/22. Na+ 137 1/24 am. Am Bmet to be drawn.  Prophylaxis: On xarelto per Dr. Berline Lopes for DVT prop post-op for a total of 4 weeks after surgery listed above on 04/20/22.  Plan: Continue plan of care per Dr. Tana Coast, Hospitalist Team. Appreciate management. Ostomy RN consult placed 1/24 for additional assistance and guidance with ostomy AM Bmet to be drawn. RN to notify IV team Possible discharge later today once seen by ostomy team and if labs stable   LOS: 0 days    [name] 05/05/2022, 10:01 AM

## 2022-05-05 NOTE — Discharge Summary (Signed)
Physician Discharge Summary   Patient: Maria Romero MRN: [id number] DOB: 01-Nov-1950  Admit date:     05/03/2022  Discharge date: 05/05/22  Discharge Physician: Estill Cotta   PCP: Hali Marry, MD   Recommendations at discharge:   Increase magnesium oxide to 400 mg daily Recommended Imodium 2 mg twice daily and can take extra if needed for high output.  Patient will titrate up DM according to the output  Discharge Diagnoses:    Hyperkalemia   Essential hypertension   AKI (acute kidney injury) ([place])   Ovarian cancer ([place])   Dehydration   Lactic acidosis Hypomagnesemia  Hospital Course:  Patient is a 72 year old female, recently diagnosed with [name], underwent TAH/BSO, tumor debulking and bowel resection with creation of an ileostomy 04/20/22. She has had high volume output of liquid stool - approximately a liter or more per day. She presented to GYN-onc clinic today for suture removal. Due to her report of weakness stat labs were ordered which revealed hyperkalemia to 6.8 with AKI with BUN 46, Cr 1.79. she was transferred to [facility]ED for continued evaluation and treatment  In ED, BP 94/56, patient was given IV fluids and admitted for further workup.   Assessment and Plan:  Hyperkalemia -Presented with K 6.8, profound dehydration due to high output ileostomy -Treated with insulin/D50, IV fluids, potassium has normalized  -Potassium 4.0 at discharge    AKI (acute kidney injury) ([place]) with lactic acidosis, profound dehydration -Likely secondary to high output ileostomy, difficult to balance I's and O's -Presented with creatinine of 1.79, lactic acid 2.5 -Improved, creatinine 0.8 at discharge       Ovarian cancer [facility]) status post debulking surgery and creation of diverting loop ileostomy -Management per gyn-oncology -Placed on Imodium BID and titrate based on the output, explained to the patient -On Xarelto for DVT prophylaxis post-op for a total of 4 weeks  after surgery      Essential hypertension -Continue valsartan, hold amlodipine as BP has been soft   Hyperlipidemia -Continue Lipitor   GERD -Continue PPI   Hypomagnesemia -Placed on magnesium oxide 400 mg daily     Underweight Estimated body mass index is 19.66 kg/m as calculated from the following:   Height as of an earlier encounter on 05/03/22: '5\' 3"'$  (1.6 m).   Weight as of an earlier encounter on 05/03/22: 50.3 kg.     Pain control - [employer] Controlled Substance Reporting System database was reviewed. and patient was instructed, not to drive, operate heavy machinery, perform activities at heights, swimming or participation in water activities or provide baby-sitting services while on Pain, Sleep and Anxiety Medications; until their outpatient Physician has advised to do so again. Also recommended to not to take more than prescribed Pain, Sleep and Anxiety Medications.  Consultants: Gyne-oncology Procedures performed:   Disposition: Home Diet recommendation:  Regular diet DISCHARGE MEDICATION: Allergies as of 05/05/2022       Reactions   Fluvastatin Sodium Other (See Comments)   Myalgias        Medication List     STOP taking these medications    acyclovir 200 MG capsule Commonly known as: ZOVIRAX   amLODipine 5 MG tablet Commonly known as: NORVASC   senna-docusate 8.6-50 MG tablet Commonly known as: Senokot-S       TAKE these medications    atorvastatin 40 MG tablet Commonly known as: LIPITOR TAKE ONE TABLET BY MOUTH EVERY NIGHT AT BEDTIME   cholecalciferol 25 MCG (1000 UNIT) tablet Commonly  known as: VITAMIN D3 Take 1,000 Units by mouth daily.   dexamethasone 4 MG tablet Commonly known as: DECADRON Take 2 tablets by mouth starting the day after chemotherapy for 3 days. Take with food.   lidocaine-prilocaine cream Commonly known as: EMLA Apply 1 Application topically as needed (for port access).   loperamide 2 MG tablet Commonly  known as: IMODIUM A-D Take 1 tablet (2 mg total) by mouth 2 (two) times daily. Take 1 extra during the day if output high What changed:  when to take this reasons to take this additional instructions   magic mouthwash (nystatin, lidocaine, diphenhydrAMINE, alum & mag hydroxide) suspension Swish and spit 5 mLs 4 (four) times daily as needed for mouth pain.   magnesium oxide 400 (240 Mg) MG tablet Commonly known as: MAG-OX Take 1 tablet (400 mg total) by mouth daily. What changed: how much to take   omeprazole 20 MG capsule Commonly known as: PRILOSEC Take 1 capsule (20 mg total) by mouth daily. What changed: when to take this   ondansetron 8 MG tablet Commonly known as: Zofran Take 1 tablet (8 mg total) by mouth every 8 (eight) hours as needed for nausea or vomiting. Start on the third day after chemotherapy.   prochlorperazine 10 MG tablet Commonly known as: COMPAZINE Take 1 tablet (10 mg total) by mouth every 6 (six) hours as needed for nausea or vomiting.   rivaroxaban 10 MG Tabs tablet Commonly known as: XARELTO Take 1 tablet (10 mg total) by mouth daily. What changed: when to take this   traMADol 50 MG tablet Commonly known as: ULTRAM Take 1 tablet (50 mg total) by mouth every 6 (six) hours as needed for severe pain. For AFTER surgery only, do not take and drive What changed:  reasons to take this additional instructions   TYLENOL 500 MG tablet Generic drug: acetaminophen Take 500-1,000 mg by mouth every 6 (six) hours as needed for mild pain or headache.   valsartan 160 MG tablet Commonly known as: DIOVAN Take 160 mg by mouth daily.   VIACTIV PO Take 1 tablet by mouth in the morning and at bedtime.        Follow-up Information     Hali Marry, MD. Schedule an appointment as soon as possible for a visit in 2 week(s).   Specialty: Family Medicine Why: for hospital follow-up Contact information: [place]  [address] [phone]         Volanda Napoleon, MD. Schedule an appointment as soon as possible for a visit in 2 week(s).   Specialty: Oncology Why: for hospital follow-up Contact information: [address] [place] [address] [phone]         [name], MD Follow up on 05/19/2022.   Specialty: Gynecologic Oncology Why: at 4pm at the [facility] for post-op check Contact information: [place] [address] [phone]                Discharge Exam: S: Feeling better today, no acute complaints, feels ready for discharge.  Output is improving  BP (!) 111/58 (BP Location: Left Arm)   Pulse 66   Temp 97.8 F (36.6 C) (Oral)   Resp 16   SpO2 100%    Physical Exam General: Alert and oriented x 3, NAD Cardiovascular: S1 S2 clear, RRR.  Respiratory: CTAB, no wheezing, rales or rhonchi Gastrointestinal: Soft, nontender, nondistended, NBS, ileostomy with  thicker stool today Ext: no pedal edema bilaterally Neuro: no new deficits Skin: No rashes Psych: Normal affect and demeanor, alert and oriented x3    Condition at discharge: fair  The results of significant diagnostics from this hospitalization (including imaging, microbiology, ancillary and laboratory) are listed below for reference.   Imaging Studies: No results found.  Microbiology: Results for orders placed or performed during the hospital encounter of 05/03/22  Blood culture (routine x 2)     Status: None (Preliminary result)   Collection Time: 05/03/22  6:36 PM   Specimen: Right Antecubital; Blood  Result Value Ref Range Status   Specimen Description   Final    RIGHT ANTECUBITAL BLOOD Performed at [facility], [address], [place], [place] [address]    Special Requests   Final    BOTTLES DRAWN AEROBIC AND ANAEROBIC Blood Culture adequate volume Performed at [facility] [address] [place], [place] [address]     Culture   Final    NO GROWTH 2 DAYS Performed at [facility] [address] [place], [place] [address]    Report Status PENDING  Incomplete  Blood culture (routine x 2)     Status: None (Preliminary result)   Collection Time: 05/03/22  6:41 PM   Specimen: Right Antecubital; Blood  Result Value Ref Range Status   Specimen Description   Final    RIGHT ANTECUBITAL BLOOD Performed at [facility], [place] [address] [place], [place] [address]    Special Requests   Final    BOTTLES DRAWN AEROBIC AND ANAEROBIC Blood Culture adequate volume Performed at [facility] [address] [place], [place] [address]    Culture   Final    NO GROWTH 2 DAYS Performed at [facility] [address] [place], [place] [address]    Report Status PENDING  Incomplete    Labs: CBC: Recent Labs  Lab 04/28/22 1447 05/03/22 1544  WBC 9.5 11.5*  NEUTROABS 7.4 9.2*  HGB 11.1* 12.2  HCT 35.0* 37.1  MCV 98.9 97.9  PLT 369 938   Basic Metabolic Panel: Recent Labs  Lab 04/28/22 1447 05/03/22 1544 05/03/22 1733 05/04/22 0425 05/05/22 0500 05/05/22 1139  NA 136 132* 133* 137  --  137  K 4.4 6.8* 5.2* 4.7  --  4.0  CL 101 104 110 113*  --  112*  CO2 23 20* 16* 18*  --  20*  GLUCOSE 109* 96 114* 89  --  108*  BUN 20 46* 46* 30*  --  15  CREATININE 1.05* 1.79* 1.49* 1.08*  --  0.82  CALCIUM 10.4* 10.3 8.7* 8.2*  --  8.3*  MG  --  1.0*  --   --  1.6*  --   PHOS  --  5.1*  --   --   --   --    Liver Function Tests: Recent Labs  Lab 04/28/22 1447 05/03/22 1544  AST 29 17  ALT 30 19  ALKPHOS 153* 136*  BILITOT 0.4 0.5  PROT 6.8 7.2  ALBUMIN 3.9 4.2   CBG: No results for input(s): "GLUCAP" in the last 168 hours.  Discharge time spent: greater than 30 minutes.  Signed: Estill Cotta, MD [facility] 05/05/2022

## 2022-05-05 NOTE — Consult Note (Signed)
WOC Nurse Consult Note: [facility] Nurse ostomy consult note: Intermittent leakage with existing flat ostomy pouches. Had had [place] and been to [facility] ED x1 with leakages. Stoma type/location: RMQ loop ileostomy with stomal bridge Stomal assessment/size: 1 and 1/2 inch oblique presentation of stoma. Red, slightly budded, moist. Bridge intact, removed today without incident Peristomal assessment: mild peristomal irritant contact dermatitis Treatment options for stomal/peristomal skin: skin barrier ring, compressible convex pouch, belt Output: brown effluent Ostomy pouching: 1pc.soft convex with skin barrier ring, belt Education provided:  Extended session for alteration in pouching routine. Patient had been to [facility] ED where ostomy paste and a flat barrier had been provided. Rationale for soft convex pouch provided and demonstrated. Also cutting skin barrier to fit oblique presentation. Finally, an explanation and demonstration of a belt is provided. Education about [employer] and getting established with a provider of supplies. Supplies sent with patient: 6 soft convex pouches, 10 skin barrier rings, ostomy powder, belt, pattern, sizing guide, and 1-page teaching guide for pouch changes. Discharging today. Enrolled patient in [facility] program: Yes, today  [facility] nursing team will not follow, but will remain available to this patient, the nursing and medical teams.  Please re-consult if needed.  Thank you for inviting [place] to participate in this patient's Plan of Care.  Maudie Flakes, MSN, RN, CNS, [place], Serita Grammes, [employer], [employer] phone:  [phone]

## 2022-05-05 NOTE — Progress Notes (Signed)
[facility] nurse paged and secure chatted.  Awaiting response.  Patient currently has discharge order but must see [facility] nurse for new ostomy

## 2022-05-06 ENCOUNTER — Telehealth: Payer: Self-pay

## 2022-05-06 ENCOUNTER — Other Ambulatory Visit: Payer: Self-pay | Admitting: Gynecologic Oncology

## 2022-05-06 ENCOUNTER — Ambulatory Visit (HOSPITAL_COMMUNITY): Payer: Medicare Other | Admitting: Nurse Practitioner

## 2022-05-06 DIAGNOSIS — R198 Other specified symptoms and signs involving the digestive system and abdomen: Secondary | ICD-10-CM

## 2022-05-06 DIAGNOSIS — E875 Hyperkalemia: Secondary | ICD-10-CM

## 2022-05-06 DIAGNOSIS — E861 Hypovolemia: Secondary | ICD-10-CM

## 2022-05-06 DIAGNOSIS — N179 Acute kidney failure, unspecified: Secondary | ICD-10-CM

## 2022-05-06 NOTE — Telephone Encounter (Signed)
I spoke to [facility] at [facility] regarding Maria Romero needing a lab scheduled there next week Tuesday/Wednesday. She states she will get the scheduler to call pt for lab appointment.

## 2022-05-06 NOTE — Telephone Encounter (Signed)
Follow-up call to patient after hospital discharge.  Lab appointment has been scheduled 05-10-22 at 1100 at [facility]. Patient reports feeling much better today. Slept well last night. Pain/"soreness" is 2-3/10.  Has not measured ostomy output but has emptied twice since hospital discharge and estimated [place] and [place]. Reports that consistency has improved since discharge. Discussed to resume measurements and report back on Monday. States slight pinkish tint to incision since staples remove. Has not increased and incision clean and dry with steri strips.  Reports she is working hard to increase oral fluid intake. States she has not yet heard from [facility] (has been in hospital) but has sufficient supplies. Advised to let us know is does not hear from [facility].

## 2022-05-08 LAB — CULTURE, BLOOD (ROUTINE X 2)
Culture: NO GROWTH
Culture: NO GROWTH
Special Requests: ADEQUATE
Special Requests: ADEQUATE

## 2022-05-09 ENCOUNTER — Telehealth: Payer: Self-pay | Admitting: *Deleted

## 2022-05-09 NOTE — Telephone Encounter (Signed)
Call from patient with update on ostomy out put.  1/26= 26 oz 1/27= 31 oz 1-28= 24 oz Total 81 oz over the last three days. 2395 cc. Reports continues to have improved consistency. States consistency is more "thin" in early morning hours between 0300-0600.  Taking Imodium mid morning and 8 pm.    Drinks 2 - 24 oz bottles of water per day- this is a huge effort for her and doesn't feel she can get more in. Drinks OJ and coffee with breakfast.No other liquid intake.    Denies any pain= pain scale of 0.  Has lab appointment tomorrow.  Advised will review with provider and call back with any additional recommendations.

## 2022-05-10 ENCOUNTER — Inpatient Hospital Stay: Payer: Medicare Other

## 2022-05-12 ENCOUNTER — Telehealth: Payer: Self-pay | Admitting: *Deleted

## 2022-05-12 NOTE — Telephone Encounter (Signed)
Call from [place], [place] with [facility] calling for confirmation to resume care post hospitalization. Confirmed to resume care. Will send confirmation order to Renold Genta, NP.

## 2022-05-13 ENCOUNTER — Encounter: Payer: Self-pay | Admitting: Hematology & Oncology

## 2022-05-13 ENCOUNTER — Ambulatory Visit (HOSPITAL_COMMUNITY): Payer: Medicare Other | Admitting: Nurse Practitioner

## 2022-05-13 ENCOUNTER — Ambulatory Visit: Payer: Self-pay

## 2022-05-13 NOTE — Patient Outreach (Signed)
  Care Coordination   Initial Visit Note   05/13/2022 Name: Maria Romero MRN: [id number] DOB: Aug 09, 1950  Maria Romero is a 72 y.o. year old female who sees [place], Rene Kocher, MD for primary care. I spoke with  Anderson Malta by phone today.  What matters to the patients health and wellness today?  04/20/22-04/25/22 admission for TAH/BSO, tumor debulking and bowel resection with creation of ileostomy. ED visit on 05/01/22 regarding ileostomy bag leaking and not staying on, patient concern for stoma getting infected. 05/03/22-05/05/22 hyperkalemia/dehydration due to high ileostomy output. She states she has support from her daughter and cousin. [name] reports she is doing better. She states home health nurse following for management of ileostomy. Patient is without questions or concerns at this time.    Goals Addressed             This Visit's Progress    Care Coordination Activities       Care Coordination Interventions:  Evaluation of current treatment plan related to post hospitalization hysterectomy, salpingo oophrectomy, bowel resection with ileostomy on 04/20/22 and patient's adherence to plan as established by provider Reviewed upcoming appointment. Confirmed patient has transportation Discussed ileostomy site-Confirmed home health nurse is following patient for nursing needs post hospitalization, new ileostomy. Patient denies any concerns and reports no concerns regarding ileostomy site. RNCM provided contact number and encouraged to call RNCM with care coordination needs as needed.        SDOH assessments and interventions completed:  Yes  SDOH Interventions Today    Flowsheet Row Most Recent Value  SDOH Interventions   Food Insecurity Interventions Intervention Not Indicated  Housing Interventions Intervention Not Indicated  Transportation Interventions Intervention Not Indicated  Utilities Interventions Intervention Not Indicated     Care Coordination  Interventions:  Yes, provided   Follow up plan: Follow up call scheduled for 05/25/22    Encounter Outcome:  Pt. Visit Completed   Thea Silversmith, RN, MSN, BSN, [facility] Coordinator [phone]

## 2022-05-13 NOTE — Patient Instructions (Addendum)
Visit Information  Thank you for taking time to visit with me today. Please don't hesitate to contact me if I can be of assistance to you.   Following are the goals we discussed today:   Goals Addressed             This Visit's Progress    Care Coordination Activities       Care Coordination Interventions:  Evaluation of current treatment plan related to post hospitalization hysterectomy, salpingo oophrectomy, bowel resection with ileostomy on 04/20/22 and patient's adherence to plan as established by provider Reviewed upcoming appointment. Confirmed patient has transportation Discussed ileostomy site-Confirmed home health nurse is following patient for nursing needs post hospitalization, new ileostomy. Patient denies any concerns and reports no concerns regarding ileostomy site. RNCM provided contact number and encouraged to call RNCM with care coordination needs as needed.        Our next appointment is by telephone on 05/25/22 at 2:00 pm  Please call the care guide team at [phone] if you need to cancel or reschedule your appointment.   If you are experiencing a Mental Health or [place] or need someone to talk to, please call the Suicide and Crisis Lifeline: [place], RN, MSN, BSN, [place] [phone]

## 2022-05-16 ENCOUNTER — Inpatient Hospital Stay: Payer: Medicare Other

## 2022-05-16 ENCOUNTER — Inpatient Hospital Stay: Payer: Medicare Other | Admitting: Hematology & Oncology

## 2022-05-16 ENCOUNTER — Inpatient Hospital Stay: Payer: Medicare Other | Attending: Hematology & Oncology

## 2022-05-16 ENCOUNTER — Other Ambulatory Visit: Payer: Self-pay

## 2022-05-16 ENCOUNTER — Encounter: Payer: Self-pay | Admitting: Hematology & Oncology

## 2022-05-16 VITALS — BP 96/55 | HR 70 | Resp 17

## 2022-05-16 VITALS — BP 85/55 | HR 87 | Temp 98.5°F | Resp 16 | Ht 63.0 in | Wt 114.0 lb

## 2022-05-16 DIAGNOSIS — R198 Other specified symptoms and signs involving the digestive system and abdomen: Secondary | ICD-10-CM

## 2022-05-16 DIAGNOSIS — E875 Hyperkalemia: Secondary | ICD-10-CM

## 2022-05-16 DIAGNOSIS — C561 Malignant neoplasm of right ovary: Secondary | ICD-10-CM | POA: Diagnosis not present

## 2022-05-16 DIAGNOSIS — Z5111 Encounter for antineoplastic chemotherapy: Secondary | ICD-10-CM | POA: Diagnosis not present

## 2022-05-16 DIAGNOSIS — Z5189 Encounter for other specified aftercare: Secondary | ICD-10-CM | POA: Insufficient documentation

## 2022-05-16 DIAGNOSIS — M7989 Other specified soft tissue disorders: Secondary | ICD-10-CM | POA: Diagnosis not present

## 2022-05-16 DIAGNOSIS — Z933 Colostomy status: Secondary | ICD-10-CM | POA: Diagnosis not present

## 2022-05-16 DIAGNOSIS — K1379 Other lesions of oral mucosa: Secondary | ICD-10-CM | POA: Diagnosis not present

## 2022-05-16 DIAGNOSIS — E861 Hypovolemia: Secondary | ICD-10-CM

## 2022-05-16 DIAGNOSIS — N179 Acute kidney failure, unspecified: Secondary | ICD-10-CM

## 2022-05-16 DIAGNOSIS — I10 Essential (primary) hypertension: Secondary | ICD-10-CM | POA: Insufficient documentation

## 2022-05-16 LAB — CBC (CANCER CENTER ONLY)
HCT: 33.3 % — ABNORMAL LOW (ref 36.0–46.0)
Hemoglobin: 10.6 g/dL — ABNORMAL LOW (ref 12.0–15.0)
MCH: 31.5 pg (ref 26.0–34.0)
MCHC: 31.8 g/dL (ref 30.0–36.0)
MCV: 98.8 fL (ref 80.0–100.0)
Platelet Count: 232 10*3/uL (ref 150–400)
RBC: 3.37 MIL/uL — ABNORMAL LOW (ref 3.87–5.11)
RDW: 16.3 % — ABNORMAL HIGH (ref 11.5–15.5)
WBC Count: 6.3 10*3/uL (ref 4.0–10.5)
nRBC: 0 % (ref 0.0–0.2)

## 2022-05-16 LAB — BASIC METABOLIC PANEL
Anion gap: 12 (ref 5–15)
BUN: 30 mg/dL — ABNORMAL HIGH (ref 8–23)
CO2: 21 mmol/L — ABNORMAL LOW (ref 22–32)
Calcium: 10.3 mg/dL (ref 8.9–10.3)
Chloride: 104 mmol/L (ref 98–111)
Creatinine, Ser: 1.51 mg/dL — ABNORMAL HIGH (ref 0.44–1.00)
GFR, Estimated: 36 mL/min — ABNORMAL LOW (ref >60)
Glucose, Bld: 104 mg/dL — ABNORMAL HIGH (ref 70–99)
Potassium: 4.3 mmol/L (ref 3.5–5.1)
Sodium: 137 mmol/L (ref 135–145)

## 2022-05-16 LAB — MAGNESIUM: Magnesium: 1 mg/dL — ABNORMAL LOW (ref 1.7–2.4)

## 2022-05-16 LAB — PHOSPHORUS: Phosphorus: 4.5 mg/dL (ref 2.5–4.6)

## 2022-05-16 MED ORDER — HEPARIN SOD (PORK) LOCK FLUSH 100 UNIT/ML IV SOLN
500.0000 [IU] | Freq: Once | INTRAVENOUS | Status: AC
  Administered 2022-05-16: 500 [IU] via INTRAVENOUS

## 2022-05-16 MED ORDER — MAGNESIUM SULFATE 4 GM/100ML IV SOLN
4.0000 g | Freq: Once | INTRAVENOUS | Status: AC
  Administered 2022-05-16: 4 g via INTRAVENOUS
  Filled 2022-05-16: qty 100

## 2022-05-16 MED ORDER — SODIUM CHLORIDE 0.9 % IV SOLN: INTRAVENOUS | Status: DC

## 2022-05-16 MED ORDER — SODIUM CHLORIDE 0.9% FLUSH
10.0000 mL | Freq: Once | INTRAVENOUS | Status: AC
  Administered 2022-05-16: 10 mL via INTRAVENOUS

## 2022-05-16 NOTE — Patient Instructions (Signed)
Hypomagnesemia Hypomagnesemia is a condition in which the level of magnesium in the blood is too low. Magnesium is a mineral that is found in many foods. It is used in many different processes in the body. Hypomagnesemia can affect every organ in the body. In severe cases, it can cause life-threatening problems. What are the causes? This condition may be caused by: Not getting enough magnesium in your diet or not having enough healthy foods to eat (malnutrition). Problems with magnesium absorption in the intestines. Dehydration. Excessive use of alcohol. Vomiting. Severe or long-term (chronic) diarrhea. Some medicines, including medicines that make you urinate more often (diuretics). Certain diseases, such as kidney disease, diabetes, celiac disease, and overactive thyroid. What are the signs or symptoms? Symptoms of this condition include: Loss of appetite, nausea, and vomiting. Involuntary shaking or trembling of a body part (tremor). Muscle weakness or tingling in the arms and legs. Sudden tightening of muscles (muscle spasms). Confusion. Psychiatric issues, such as: Depression and irritability. Psychosis. A feeling of fluttering of the heart (palpitations). Seizures. These symptoms are more severe if magnesium levels drop suddenly. How is this diagnosed? This condition may be diagnosed based on: Your symptoms and medical history. A physical exam. Blood and urine tests. How is this treated? Treatment depends on the cause and the severity of the condition. It may be treated by: Taking a magnesium supplement. This can be taken in pill form. If the condition is severe, magnesium is usually given through an IV. Making changes to your diet. You may be directed to eat foods that have a lot of magnesium, such as green leafy vegetables, peas, beans, and nuts. Not drinking alcohol. If you are struggling not to drink, ask your health care provider for help. Follow these instructions at  home: Eating and drinking     Make sure that your diet includes foods with magnesium. Foods that have a lot of magnesium in them include: Green leafy vegetables, such as spinach and broccoli. Beans and peas. Nuts and seeds, such as almonds and sunflower seeds. Whole grains, such as whole grain bread and fortified cereals. Drink fluids that contain salts and minerals (electrolytes), such as sports drinks, when you are active. Do not drink alcohol. General instructions Take over-the-counter and prescription medicines only as told by your health care provider. Take magnesium supplements as directed if your health care provider tells you to take them. Have your magnesium levels monitored as told by your health care provider. Keep all follow-up visits. This is important. Contact a health care provider if: You get worse instead of better. Your symptoms return. Get help right away if: You develop severe muscle weakness. You have trouble breathing. You feel that your heart is racing. These symptoms may represent a serious problem that is an emergency. Do not wait to see if the symptoms will go away. Get medical help right away. Call your local emergency services (911 in the [place] Do not drive yourself to the hospital. Summary Hypomagnesemia is a condition in which the level of magnesium in the blood is too low. Hypomagnesemia can affect every organ in the body. Treatment may include eating more foods that contain magnesium, taking magnesium supplements, and not drinking alcohol. Have your magnesium levels monitored as told by your health care provider. This information is not intended to replace advice given to you by your health care provider. Make sure you discuss any questions you have with your health care provider. Document Revised: 08/25/2020 Document Reviewed: 08/25/2020 Elsevier Patient Education    2023 [employer] Inc[employer]  

## 2022-05-16 NOTE — Progress Notes (Signed)
Hematology and Oncology Follow Up Visit  Maria Romero [id number] November 23, 1950 72 y.o. 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  Maria did have her surgery.  Dr. Berline Lopes took her to surgery on 04/20/2022.  Maria did a very extensive abdominal and pelvic resection.  The pathology report ([id number]) showed that there was carcinoma of the right ovary.  This measured 10.5 x 7.5 x 5 cm.  There was a omental implant.  Maria did have involvement of the colonic serosa.  There is no evidence of any distant metastasis.  There was tumor in the peritoneal fluid.  Maria had a colostomy done.  The colostomy has had a large amount of output.  Maria Maria was hospitalized because of electrolyte abnormalities.  Maria does look quite good right now.  Maria Romero has been off chemotherapy now for couple weeks.  We really need to get her started back on treatment.  Maria clearly does have high risk disease.  Thankfully, Maria is  HRD+ so that we can utilize a PARP inhibitor.  Her last CA 125 which was done for surgery was 167.  It will be interesting to see what it is now.    Maria is eating a little bit better.  Maria has had no cough or shortness of breath.  Maria has had no bleeding.  There has been a little bit of leg swelling.  Overall, I would say that her performance status is probably ECOG 1.   Wt Readings from Last 3 Encounters:  05/16/22 114 lb (51.7 kg)  05/03/22 111 lb (50.3 kg)  04/21/22 131 lb 9.8 oz (59.7 kg)    Medications:  Current Outpatient Medications:    atorvastatin (LIPITOR) 40 MG tablet, TAKE ONE TABLET BY MOUTH EVERY NIGHT AT BEDTIME, Disp: 90  tablet, Rfl: 3   Calcium-Vitamin D-Vitamin K (VIACTIV PO), Take 1 tablet by mouth in the morning and at bedtime., Disp: , Rfl:    cholecalciferol (VITAMIN D3) 25 MCG (1000 UNIT) tablet, Take 1,000 Units by mouth daily., Disp: , Rfl:    dexamethasone (DECADRON) 4 MG tablet, Take 2 tablets by mouth starting the day after chemotherapy for 3 days. Take with food. (Patient not taking: Reported on 05/03/2022), Disp: 30 tablet, Rfl: 1   lidocaine-prilocaine (EMLA) cream, Apply 1 Application topically as needed (for port access)., Disp: , Rfl:    loperamide (IMODIUM A-D) 2 MG tablet, Take 1 tablet (2 mg total) by mouth 2 (two) times daily. Take 1 extra during the day if output high, Disp: 30 tablet, Rfl: 0   magic mouthwash (nystatin, lidocaine, diphenhydrAMINE, alum & mag hydroxide) suspension, Swish and spit 5 mLs 4 (four) times daily as needed for mouth pain., Disp: 240 mL, Rfl: 1   magnesium oxide (MAG-OX) 400 (240 Mg) MG tablet, Take 1 tablet (400 mg total) by mouth daily., Disp: 30 tablet, Rfl: 3   omeprazole (PRILOSEC) 20 MG capsule, Take 1 capsule (20 mg total) by mouth daily. (Patient taking differently: Take 20 mg by mouth daily before breakfast.), Disp: 90 capsule, Rfl: 2   ondansetron (ZOFRAN) 8 MG tablet, Take 1 tablet (8 mg total) by mouth every 8 (eight) hours  as needed for nausea or vomiting. Start on the third day after chemotherapy., Disp: 30 tablet, Rfl: 1   prochlorperazine (COMPAZINE) 10 MG tablet, Take 1 tablet (10 mg total) by mouth every 6 (six) hours as needed for nausea or vomiting., Disp: 30 tablet, Rfl: 1   rivaroxaban (XARELTO) 10 MG TABS tablet, Take 1 tablet (10 mg total) by mouth daily. (Patient taking differently: Take 10 mg by mouth at bedtime.), Disp: 28 tablet, Rfl: 0   traMADol (ULTRAM) 50 MG tablet, Take 1 tablet (50 mg total) by mouth every 6 (six) hours as needed for severe pain. For AFTER surgery only, do not take and drive (Patient taking differently: Take 50 mg by mouth  every 6 (six) hours as needed for severe pain (do not take this and drive).), Disp: 10 tablet, Rfl: 0   TYLENOL 500 MG tablet, Take 500-1,000 mg by mouth every 6 (six) hours as needed for mild pain or headache., Disp: , Rfl:    valsartan (DIOVAN) 160 MG tablet, Take 160 mg by mouth daily., Disp: , Rfl:   Allergies:  Allergies  Allergen Reactions   Fluvastatin Sodium Other (See Comments)    Myalgias    Past Medical History, Surgical history, Social history, and Family History were reviewed and updated.  Review of Systems: Review of Systems  Constitutional: Negative.   HENT:  Negative.    Eyes: Negative.   Respiratory: Negative.    Cardiovascular: Negative.   Gastrointestinal:  Negative for abdominal pain.  Genitourinary:  Negative for pelvic pain.   Musculoskeletal: Negative.   Skin: Negative.   Neurological: Negative.   Hematological: Negative.   Psychiatric/Behavioral: Negative.      Physical Exam:  height is '5\' 3"'$  (1.6 m) and weight is 114 lb (51.7 kg). Her oral temperature is 98.5 F (36.9 C). Her blood pressure is 85/55 (abnormal) and her pulse is 87. Her respiration is 16 and oxygen saturation is 100%.   Wt Readings from Last 3 Encounters:  05/16/22 114 lb (51.7 kg)  05/03/22 111 lb (50.3 kg)  04/21/22 131 lb 9.8 oz (59.7 kg)    Physical Exam Vitals reviewed.  HENT:     Head: Normocephalic and atraumatic.  Eyes:     Pupils: Pupils are equal, round, and reactive to light.  Cardiovascular:     Rate and Rhythm: Normal rate and regular rhythm.     Heart sounds: Normal heart sounds.  Pulmonary:     Effort: Pulmonary effort is normal.     Breath sounds: Normal breath sounds.  Chest:    Abdominal:     General: Bowel sounds are normal.     Palpations: Abdomen is soft.     Comments: Abdominal exam shows a colostomy in the right lower quadrant.  There is a well-healed laparotomy scar.  Maria has no fluid wave.  There is no palpable abdominal mass.  There is no  palpable liver or spleen tip.  Is scantly distended  Musculoskeletal:        General: No tenderness or deformity. Normal range of motion.     Cervical back: Normal range of motion.  Lymphadenopathy:     Cervical: No cervical adenopathy.  Skin:    General: Skin is warm and dry.     Findings: No erythema or rash.  Neurological:     Mental Status: Maria is alert and oriented to person, place, and time.  Psychiatric:        Behavior: Behavior normal.  Thought Content: Thought content normal.        Judgment: Judgment normal.      Lab Results  Component Value Date   WBC 6.3 05/16/2022   HGB 10.6 (L) 05/16/2022   HCT 33.3 (L) 05/16/2022   MCV 98.8 05/16/2022   PLT 232 05/16/2022     Chemistry      Component Value Date/Time   NA 137 05/05/2022 1139   K 4.0 05/05/2022 1139   CL 112 (H) 05/05/2022 1139   CO2 20 (L) 05/05/2022 1139   BUN 15 05/05/2022 1139   CREATININE 0.82 05/05/2022 1139   CREATININE 1.05 (H) 04/28/2022 1447   CREATININE 0.93 11/03/2021 0000      Component Value Date/Time   CALCIUM 8.3 (L) 05/05/2022 1139   ALKPHOS 136 (H) 05/03/2022 1544   AST 17 05/03/2022 1544   AST 29 04/28/2022 1447   ALT 19 05/03/2022 1544   ALT 30 04/28/2022 1447   BILITOT 0.5 05/03/2022 1544   BILITOT 0.4 04/28/2022 1447        Impression and Plan: Ms. Mcnicholas is a very charming 72 year old white female.  Maria had neoadjuvant chemotherapy.  Maria really had a tough time with neoadjuvant chemotherapy.  Maria subsequently underwent surgical resection.  Maria did still have active disease.  Again, we really need to see what her tumor marker looks like.  Will have to get her on adjuvant chemotherapy now.  I would not use Avastin given that Maria just had surgery.  I think that the carboplatinum/Taxotere and would be a reasonable way to treat her since Maria did well with this combination in the neoadjuvant setting.  Hopefully, we can get her into a long-term remission.  Again I would  like to get her on a PARP inhibitor since Maria does have a tumor that is  HRD+.  We will go ahead with the adjuvant treatment this week.  Her magnesium is quite low.  We will give her some IV magnesium today.  Hopefully, Maria will be able to have the colostomy reversed at some point.  This will certainly help her quality of life.  Will plan to get her back in about 3 weeks for her second cycle of adjuvant therapy.  At some point, we may want to add Avastin, depending on her response in the CA-125.   Volanda Napoleon, MD 2/5/202412:11 PM

## 2022-05-16 NOTE — Patient Instructions (Signed)

## 2022-05-17 ENCOUNTER — Encounter: Payer: Self-pay | Admitting: Family Medicine

## 2022-05-17 ENCOUNTER — Encounter: Payer: Self-pay | Admitting: *Deleted

## 2022-05-17 ENCOUNTER — Telehealth: Payer: Self-pay | Admitting: *Deleted

## 2022-05-17 ENCOUNTER — Telehealth: Payer: Self-pay

## 2022-05-17 ENCOUNTER — Ambulatory Visit (INDEPENDENT_AMBULATORY_CARE_PROVIDER_SITE_OTHER): Payer: Medicare Other | Admitting: Family Medicine

## 2022-05-17 DIAGNOSIS — R790 Abnormal level of blood mineral: Secondary | ICD-10-CM | POA: Diagnosis not present

## 2022-05-17 DIAGNOSIS — R1084 Generalized abdominal pain: Secondary | ICD-10-CM | POA: Diagnosis not present

## 2022-05-17 DIAGNOSIS — N179 Acute kidney failure, unspecified: Secondary | ICD-10-CM

## 2022-05-17 DIAGNOSIS — R499 Unspecified voice and resonance disorder: Secondary | ICD-10-CM | POA: Diagnosis not present

## 2022-05-17 DIAGNOSIS — I10 Essential (primary) hypertension: Secondary | ICD-10-CM

## 2022-05-17 LAB — CA 125: Cancer Antigen (CA) 125: 31.9 U/mL (ref 0.0–38.1)

## 2022-05-17 NOTE — Telephone Encounter (Signed)
Called patient and informed her of results. She was very appreciative of call.  She has requested her appt move from Thursday to Friday for her injection as she has a follow up with her surgeon on Friday. Scheduling message sent.

## 2022-05-17 NOTE — Progress Notes (Unsigned)
Gynecologic Oncology Return Clinic Visit  05/19/22  Reason for Visit: follow-up after surgery  Treatment History: Oncology History  [name], right ([place])  11/15/2021 Imaging   CT A/P: 1. Large volume ascites with peritoneal thickening and omental caking in the left abdomen. Imaging features are consistent with metastatic disease. 2. Contiguous with the posterior uterine fundus and extending into the right adnexal space is a poorly marginated heterogeneous 9.6 x 8.8 x 9.4 cm mass. Imaging features are consistent with neoplasm likely of right ovarian etiology. Associated heterogeneously enhancing mass involving the posterior lower uterine segment and cervix extending posteriorly towards the cul-de-sac. This may be a drop metastasis. 3. Soft tissue fullness also noted left adnexal region without normal left ovary by CT. 4. Subtle irregularity of liver contour raising the question of but not definitive for cirrhosis. 5. Small left and tiny right pleural effusions. 6. Aortic Atherosclerosis (ICD10-I70.0).   11/25/2021 Imaging   CT chest 1. Bilateral pleural effusions greatest on the LEFT both small volume but with fissural nodularity along the major fissure in the LEFT chest. Early involvement in the chest is not excluded. Sampling of pleural fluid may be helpful. 2. No adenopathy by size criteria. Tiny pulmonary nodule in the LEFT lower lobe as described. 3. Three-vessel coronary artery disease greatest in LEFT coronary circulation. 4. Reduction in malignant ascites since previous imaging following paracentesis.   12/01/2021 Initial Diagnosis   [name], right ([place])   12/01/2021 Cancer Staging   Staging form: Ovary, Fallopian Tube, and Primary Peritoneal Carcinoma, AJCC 8th Edition - Clinical stage from 12/01/2021: FIGO Stage IIIC (cT3c, cN1b, cM0) - Signed by Volanda Napoleon, MD on 12/01/2021 Stage prefix: Initial diagnosis Histologic grade (G): G2 Histologic grading  system: 4 grade system   12/07/2021 -  Chemotherapy   Patient is on Treatment Plan : OVARIAN Carboplatin (AUC 6) + Paclitaxel (175) q21d X 6 Cycles     01/11/2022 -  Chemotherapy   Patient is on Treatment Plan : OVARIAN Bevacizumab q21d      Genetic Testing   Negative genetic testing. No pathogenic variants identified on the [facility] Common Hereditary Cancers+RNA panel. The report date is 01/24/2022.  The Common Hereditary Cancers Panel + RNA offered by [facility] includes sequencing and/or deletion duplication testing of the following 47 genes: APC, ATM, AXIN2, BARD1, BMPR1A, BRCA1, BRCA2, BRIP1, CDH1, CDKN2A (p14ARF), CDKN2A (p16INK4a), CKD4, CHEK2, CTNNA1, DICER1, EPCAM (Deletion/duplication testing only), GREM1 (promoter region deletion/duplication testing only), KIT, MEN1, MLH1, MSH2, MSH3, MSH6, MUTYH, NBN, NF1, NHTL1, PALB2, PDGFRA, PMS2, POLD1, POLE, PTEN, RAD50, RAD51C, RAD51D, SDHB, SDHC, SDHD, SMAD4, SMARCA4. STK11, TP53, TSC1, TSC2, and VHL.  The following genes were evaluated for sequence changes only: SDHA and HOXB13 c.251G>A variant only.   03/08/2022 Imaging   CT C/A/P: 1. Reduction in the omental caking of tumor compared to 11/15/2021, although substantial tumor remains. 2. The right adnexal mass has a volume of 220 cubic cm and appears to be primarily solid, although components may be hemorrhagic. Previous ascites has convinced into a loculated collection of fluid surrounding this adnexal mass and measuring 1400 cc. 3. Questionable bowel wall thickening in the transverse colon, although much of the appearance may be due to nondistention. Correlate with any signs of colitis. 4. Previous right pleural effusion has resolved. Residual small left pleural effusion is nonspecific for transudative or exudative etiology. 5. Other imaging findings of potential clinical significance: Substantial left anterior descending coronary artery atherosclerosis. Grade 1 degenerative  anterolisthesis L4-5. Degenerative facet arthropathy  at L4-5 and L5-S1. Old healed right posterior rib fractures. Small calcifications along the pleural margin of the left hemidiaphragm, possibly from prior asbestos exposure or prior pleurodesis. 6. Aortic atherosclerosis.   04/20/2022 Surgery   Diagnostic laparoscopy, exploratory laparotomy with lysis of adhesions for approximately 45 minutes, omentectomy including mobilization of the splenic flexure, radical tumor debulking with en bloc resection of bilateral adnexa, uterus, cervix and rectosigmoid colon, end-to-end colonic reanastomosis, diverting loop ileostomy, cystoscopy   Findings: On EUA, fixed pelvic mass with nodularity appreciated within the posterior cul-de-sac.  On intra-abdominal entry the laparoscope, upper abdominal adhesions limiting assessment of the left upper quadrant.  Large, cystic mass fills the mid abdomen.  On laparotomy, approximately 22 cm cystic mass spans from the deep pelvis to the upper abdomen with a rind.  This lesion appears to be walling off the intra-abdominal ascites.  Multiple loops of small bowel are adherent to the cystic lesion.  There is significant filmy adhesive disease between loops of small bowel, the bowel and cystic lesion, and the small bowel and anterior abdominal wall.  Also numerous less than 1 cm white nodules noted along the small bowel and mesentery (frequently within filmy adhesions), most consistent with treated tumor.  During lysis of adhesions, 2 areas of serosal injury noted along the ileum, oversewn.  Approximately 4-6 cm tumor implant within the greater omentum and omental nodularity extending along the omentum to the splenic flexure.  Lesser sac without disease.  Stomach normal in appearance.,  Adhesions between the liver and the anterior abdominal wall bilaterally, no nodularity appreciated along either the liver or diaphragm.  Appendix itself normal-appearing, appendiceal mesentery minimally  adherent to the right adnexa.  Normal cecum, ascending colon, transverse colon, and descending colon.  Large cystic mass noted to be adherent to the bladder peritoneum and uterus anteriorly within the pelvis.  Left adnexa is somewhat distorted.  Right ovary replaced by an 8 cm cystic mass.  Normal-appearing distal right fallopian fimbria.  Area of the sigmoid colon adherent posterior to the right adnexa, requiring an en bloc resection.  Somewhat woody texture palpated along the rectovaginal septum after reverse hysterectomy performed.  Negative bubble test after rectosigmoid anastomosis.  In the setting of planned additional treatment and recent receipt of bevacizumab, decision made to perform diverting loop ileostomy. Multiple adhesions and nodular implants sent.  If these are negative for malignancy or show treated tumor, then this was an R0 resection.  Otherwise, R1 resection. On cystoscopy, bladder dome intact. Good efflux seen from bilateral ureteral orifices.    04/20/2022 Pathology Results   A. SMALL BOWEL ADHESION, EXCISION: -Fibrous tissue with chronic inflammation, consistent with adhesions  B. SMALL BOWEL NODULE, EXCISION: - Benign fibrotic nodule - Negative for carcinoma  C. UTERUS, CERVIX, BILATERAL TUBES AND OVARIES, RECTOSIGMOID COLON, RESECTION: - Cervix: Benign, nabothian cyst - Endometrium: Benign inactive endometrium - Myometrium: No significant pathologic changes - Ovary: High-grade serous carcinoma - Colon: Metastatic serous carcinoma involving colonic serosa - See oncology table  D. OMENTUM, EXCISION: - Involved by high-grade serous carcinoma  E. RECTAL DONUTS, EXCISION: - Benign colonic mucosa with no significant pathologic changes   ONCOLOGY TABLE:  OVARY or FALLOPIAN TUBE or PRIMARY PERITONEUM: Resection  Procedure: Total hysterectomy, rectosigmoid colon resection, omental excision, adhesiolysis Specimen Integrity: Disrupted Tumor Site: Ovary Tumor Size:  10.5 x 7.5 x 5 cm Histologic Type: serous carcinoma Histologic Grade: High-grade Ovarian Surface Involvement: See comment Fallopian Tube Surface Involvement: Not identified Implants (required for advanced stage serous/seromucinous  borderline tumors only): Invasive implants Other Tissue/ Organ Involvement: Colonic serosa Largest Extrapelvic Peritoneal Focus: 8 cm (omental implant) Peritoneal/Ascitic Fluid Involvement: Present Chemotherapy Response Score (CRS): CRS2 Regional Lymph Nodes: Not applicable       Distant Metastasis:      Distant Site(s) Involved: None Pathologic Stage Classification (pTNM, AJCC 8th Edition): pT3c, pN[not assigned] Ancillary Studies: Can be performed upon request Representative Tumor Block: C11 Comment(s): Assessment of ovarian surface involvement is limited by disrupted and necrotic nature of the specimen. The tumor is located in the right ovary per the surgeon, an additional ovary and fallopian tube are not identified. An additional section is submitted to identify possible fallopian tube.      Interval History: Patient reports doing very well.  Had 1 episode where her bag leaked over the weekend because she rolled in bed.  If she is able to sleep on her back, which she does most of the time, her bag is not leaking.  Notes better consistency to her stool with normalization of output.  Is using Imodium if she notices that the stool consistency gets to liquid.  Denies any significant abdominal or pelvic pain.  Denies any vaginal bleeding or discharge.  Urinating regularly and drinking lots of water.  Reports shortness of breath, which seems worse in the morning, is at baseline.  Past Medical/Surgical History: Past Medical History:  Diagnosis Date   Anemia    Complication of anesthesia    Dyspnea    Goals of care, counseling/discussion 12/01/2021   Hypercholesterolemia    Hypertension    IBS (irritable bowel syndrome)    Malignant ascites 11/19/2021    Osteopenia    Osteoporosis    [name], right ([place]) 12/01/2021   Ovarian mass, right 11/19/2021   PONV (postoperative nausea and vomiting)    Toe fracture    lt small toe    Past Surgical History:  Procedure Laterality Date   BOWEL RESECTION N/A 04/20/2022   Procedure: BOWEL RESECTION; DIVERTING ILEOSTOMY;  Surgeon: [name], MD;  Location: [facility];  Service: Gynecology;  Laterality: N/A;   DEBULKING N/A 04/20/2022   Procedure: TUMOR DEBULKING, OMENTECTOMY;  Surgeon: [name], MD;  Location: [facility];  Service: Gynecology;  Laterality: N/A;   HYSTERECTOMY ABDOMINAL WITH SALPINGO-OOPHORECTOMY Bilateral 04/20/2022   Procedure: HYSTERECTOMY ABDOMINAL WITH BILATERAL SALPINGO-OOPHORECTOMY;  Surgeon: [name], MD;  Location: [facility];  Service: Gynecology;  Laterality: Bilateral;   IR IMAGING GUIDED PORT INSERTION  11/26/2021   IR PARACENTESIS  11/23/2021   IR PARACENTESIS  11/26/2021   LAPAROSCOPY N/A 04/20/2022   Procedure: LAPAROSCOPY DIAGNOSTIC,CYSTO;  Surgeon: [name], MD;  Location: [facility];  Service: Gynecology;  Laterality: N/A;   lump removal  04/11/1993   axilla right - benign   TONSILLECTOMY     TUBAL LIGATION  04/11/1988    Family History  Problem Relation Age of Onset   Stroke Mother    Hypertension Mother    Hyperlipidemia Mother    Glaucoma Mother    Heart attack Father 52   Diabetes Father    Hypertension Father    Hyperlipidemia Father    Cataracts Father    Breast cancer Cousin    Colon cancer Neg Hx    Ovarian cancer Neg Hx    Endometrial cancer Neg Hx    Pancreatic cancer Neg Hx    Prostate cancer Neg Hx     Social History   Socioeconomic History   Marital status: Widowed  Spouse name: Not on file   Number of children: 1   Years of education: 43   Highest education level: Associate degree: occupational, Hotel manager, or vocational program  Occupational History   Occupation: retired Quarry manager  Tobacco Use    Smoking status: Former    Types: Cigarettes    Quit date: 03/20/1990    Years since quitting: 32.1   Smokeless tobacco: Never   Tobacco comments:    quit 1988  Vaping Use   Vaping Use: Never used  Substance and Sexual Activity   Alcohol use: Not Currently    Alcohol/week: 2.0 standard drinks of alcohol    Types: 2 Glasses of wine per week    Comment: Wine in the evenings   Drug use: No   Sexual activity: Not Currently    Partners: Male  Other Topics Concern   Not on file  Social History Narrative   Lives alone with her dog. Helps with her grandson pick up and volunteering at his school. She enjoys spending time with her family.   Social Determinants of Health   Financial Resource Strain: Low Risk  (03/26/2021)   Overall Financial Resource Strain (CARDIA)    Difficulty of Paying Living Expenses: Not hard at all  Food Insecurity: No Food Insecurity (05/13/2022)   Hunger Vital Sign    Worried About Running Out of Food in the Last Year: Never true    Ran Out of Food in the Last Year: Never true  Transportation Needs: No Transportation Needs (05/13/2022)   PRAPARE - Hydrologist (Medical): No    Lack of Transportation (Non-Medical): No  Physical Activity: Inactive (03/26/2021)   Exercise Vital Sign    Days of Exercise per Week: 0 days    Minutes of Exercise per Session: 0 min  Stress: No Stress Concern Present (03/26/2021)   [place]    Feeling of Stress : Not at all  Social Connections: Socially Isolated (03/26/2021)   Social Connection and Isolation Panel [NHANES]    Frequency of Communication with Friends and Family: More than three times a week    Frequency of Social Gatherings with Friends and Family: Three times a week    Attends Religious Services: Never    Active Member of Clubs or Organizations: No    Attends Archivist Meetings: Never    Marital Status: Widowed     Current Medications:  Current Outpatient Medications:    atorvastatin (LIPITOR) 40 MG tablet, TAKE ONE TABLET BY MOUTH EVERY NIGHT AT BEDTIME, Disp: 90 tablet, Rfl: 3   Calcium-Vitamin D-Vitamin K (VIACTIV PO), Take 1 tablet by mouth in the morning and at bedtime., Disp: , Rfl:    cholecalciferol (VITAMIN D3) 25 MCG (1000 UNIT) tablet, Take 1,000 Units by mouth daily., Disp: , Rfl:    lidocaine-prilocaine (EMLA) cream, Apply 1 Application topically as needed (for port access)., Disp: , Rfl:    loperamide (IMODIUM A-D) 2 MG tablet, Take 1 tablet (2 mg total) by mouth 2 (two) times daily. Take 1 extra during the day if output high, Disp: 30 tablet, Rfl: 0   magic mouthwash (nystatin, lidocaine, diphenhydrAMINE, alum & mag hydroxide) suspension, Swish and spit 5 mLs 4 (four) times daily as needed for mouth pain., Disp: 240 mL, Rfl: 1   magnesium oxide (MAG-OX) 400 (240 Mg) MG tablet, Take 1 tablet (400 mg total) by mouth daily., Disp: 30 tablet, Rfl: 3  omeprazole (PRILOSEC) 20 MG capsule, Take 1 capsule (20 mg total) by mouth daily. (Patient taking differently: Take 20 mg by mouth daily before breakfast.), Disp: 90 capsule, Rfl: 2   ondansetron (ZOFRAN) 8 MG tablet, Take 1 tablet (8 mg total) by mouth every 8 (eight) hours as needed for nausea or vomiting. Start on the third day after chemotherapy., Disp: 30 tablet, Rfl: 1   prochlorperazine (COMPAZINE) 10 MG tablet, Take 1 tablet (10 mg total) by mouth every 6 (six) hours as needed for nausea or vomiting., Disp: 30 tablet, Rfl: 1   rivaroxaban (XARELTO) 10 MG TABS tablet, Take 1 tablet (10 mg total) by mouth daily. (Patient taking differently: Take 10 mg by mouth at bedtime.), Disp: 28 tablet, Rfl: 0   TYLENOL 500 MG tablet, Take 500-1,000 mg by mouth every 6 (six) hours as needed for mild pain or headache., Disp: , Rfl:   Review of Systems: + ringing in ears, shortness of breath, back pain Denies appetite changes, fevers, chills, fatigue,  unexplained weight changes. Denies hearing loss, neck lumps or masses, mouth sores, or voice changes. Denies cough or wheezing.   Denies chest pain or palpitations. Denies leg swelling. Denies abdominal distention, pain, blood in stools, constipation, diarrhea, nausea, vomiting, or early satiety. Denies pain with intercourse, dysuria, frequency, hematuria or incontinence. Denies hot flashes, pelvic pain, vaginal bleeding or vaginal discharge.   Denies joint pain or muscle pain/cramps. Denies itching, rash, or wounds. Denies dizziness, headaches, numbness or seizures. Denies swollen lymph nodes or glands, denies easy bruising or bleeding. Denies anxiety, depression, confusion, or decreased concentration.  Physical Exam: BP 123/67 (BP Location: Right Arm, Patient Position: Sitting)   Pulse 64   Resp 16   Wt 115 lb (52.2 kg)   SpO2 100%   BMI 20.37 kg/m  General: Alert, oriented, no acute distress. HEENT: Normocephalic, atraumatic, sclera anicteric. Chest: Clear to auscultation bilaterally.  No wheezes or rhonchi. Abdomen: soft, nontender.  Normoactive bowel sounds.  No masses or hepatosplenomegaly appreciated.  Well-healed incision.  Ostomy with bag in place.  Ostomy itself is pink and viable. Extremities: Grossly normal range of motion.  Warm, well perfused.  No edema bilaterally. GU: Normal appearing external genitalia without erythema, excoriation, or lesions.  Speculum exam reveals cuff intact, suture visible.  Bimanual exam reveals cuff intact, no fluctuance or tenderness with palpation.    Laboratory & Radiologic Studies: Component Ref Range & Units 1 d ago (05/16/22) 1 mo ago (03/30/22) 2 mo ago (03/18/22) 2 mo ago (02/24/22) 3 mo ago (02/01/22) 3 mo ago (01/21/22) 4 mo ago (01/11/22)  Cancer Antigen (CA) 125 0.0 - 38.1 U/mL 31.9 167.0 High  CM 280.0 High  CM 369.0 High  CM 550.0 High  CM 546.0 High  CM 760.0 High  CM      Latest Ref Rng & Units 05/16/2022   11:40 AM 05/03/2022     3:44 PM 04/28/2022    2:47 PM  CBC  WBC 4.0 - 10.5 K/uL 6.3  11.5  9.5   Hemoglobin 12.0 - 15.0 g/dL 10.6  12.2  11.1   Hematocrit 36.0 - 46.0 % 33.3  37.1  35.0   Platelets 150 - 400 K/uL 232  374  369       Latest Ref Rng & Units 05/16/2022   11:40 AM 05/05/2022   11:39 AM 05/04/2022    4:25 AM  BMP  Glucose 70 - 99 mg/dL 104  108  89   BUN 8 -  23 mg/dL '30  15  30   '$ Creatinine 0.44 - 1.00 mg/dL 1.51  0.82  1.08   Sodium 135 - 145 mmol/L 137  137  137   Potassium 3.5 - 5.1 mmol/L 4.3  4.0  4.7   Chloride 98 - 111 mmol/L 104  112  113   CO2 22 - 32 mmol/L '21  20  18   '$ Calcium 8.9 - 10.3 mg/dL 10.3  8.3  8.2    Assessment & Plan: Maria Romero is a 73 y.o. 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  Germline testing negative. HRD+.   Patient is doing very well.  We reviewed continued expectations and restrictions.  She is now managing very well with her ostomy.  Magnesium was replaced earlier this week by her medical oncologist.  We reviewed pathology and findings from surgery again.  Patient was given a copy of her final pathology report.  Given rectosigmoid resection and reanastomosis, she should not receive bevacizumab for at least 6 weeks after surgery.  We also discussed that plan would be for repeat imaging after she finishes adjuvant chemotherapy.  At the time of the CT scan, she should receive water-soluble rectal contrast to reevaluate her anastomosis.  If anastomosis has healed well, I will plan to take her for ostomy reversal.  Given need for healing after surgery and plan for what I hope will be reversal surgery in approximately 4 months, patient has limited amount of time that she could receive a Avastin.  Given her HRD status, she would be a candidate for maintenance PARP inhibitor.  Given elevated creatinine at her visit earlier this week, I will reach out to her medical oncologist about getting repeat BMP at her visit  tomorrow.  22 minutes of total time was spent for this patient encounter, including preparation, face-to-face counseling with the patient and coordination of care, and documentation of the encounter.  Jeral Pinch, MD  Division of Gynecologic Oncology  Department of Obstetrics and Gynecology  [facility]

## 2022-05-17 NOTE — Assessment & Plan Note (Signed)
Is having some increased epigastric pain I am wondering if it could even be from the cheese on the pizza she does not eat a lot of dairy in general.  Okay to use Tums if it is not improving or getting worse then please let us know.  She will follow-up at the cancer center tomorrow.

## 2022-05-17 NOTE — Telephone Encounter (Signed)
Fax signed notes and orders both yesterday and today to [facility]

## 2022-05-17 NOTE — Progress Notes (Signed)
Established Patient Office Visit  Subjective   Patient ID: Maria Romero, female    DOB: 1950/12/03  Age: 72 y.o. MRN: [id number]  Chief Complaint  Patient presents with   Follow-up         HPI  Here today hospital follow-up:   Admit date:     05/03/2022  Discharge date: 05/05/22  Discharge Physician: Estill Cotta    PCP: Hali Marry, MD    Recommendations at discharge:    Increase magnesium oxide to 400 mg daily Recommended Imodium 2 mg twice daily and can take extra if needed for high output.  Patient will titrate up DM according to the output   Discharge Diagnoses:     Hyperkalemia   Essential hypertension   AKI (acute kidney injury) ([place])   Ovarian cancer ([place])   Dehydration   Lactic acidosis Hypomagnesemia   Hospital Course:   Patient is a 72 year old female, recently diagnosed with [name], underwent TAH/BSO, tumor debulking and bowel resection with creation of an ileostomy 04/20/22. She has had high volume output of liquid stool - approximately a liter or more per day. She presented to GYN-onc clinic today for suture removal. Due to her report of weakness stat labs were ordered which revealed hyperkalemia to 6.8 with AKI with BUN 46, Cr 1.79. she was transferred to [facility] for continued evaluation and treatment  In ED, BP 94/56, patient was given IV fluids and admitted for further workup.   Pressures were low at discharge from hospitalization and so at the time they did have her hold her amlodipine. They stopped her valsartan yesterday as well.   He also had a bump in serum creatinine while admitted most likely due to dehydration from high output of the ileostomy.  So they did put her on Imodium to slow the bowels down.  Repeat creatinine improved.  Raw vegetables upsets her stomach.  She says last night she had some fish and a small slice of pizza.  She had not had pizza in quite some time and the fish was fried and a little bit of oil on the  grill.  She says today her epigastric area has been uncomfortable and hurting.  No vomiting.  She wanted to know if she could take Tums.  Also reports struggling with voice change that is been going on for about 6 months ever since starting her first treatments.  Some days it is a little better than other days.  It just feels very raspy.  Sometimes she is able to clear her throat and improves temporarily but not always.  She still has 2-3 more chemo treatments.  Has struggled to keep her magnesium levels elevated and in fact has had to have a couple of infusions.  The last 1 was yesterday.    ROS    Objective:     BP 103/70   Pulse 81   Ht '5\' 3"'$  (1.6 m)   Wt 116 lb (52.6 kg)   SpO2 100%   BMI 20.55 kg/m    Physical Exam Vitals and nursing note reviewed.  Constitutional:      Appearance: She is well-developed.  HENT:     Head: Normocephalic and atraumatic.  Cardiovascular:     Rate and Rhythm: Normal rate and regular rhythm.     Heart sounds: Normal heart sounds.  Pulmonary:     Effort: Pulmonary effort is normal.     Breath sounds: Normal breath sounds.  Skin:    General:  Skin is warm and dry.  Neurological:     Mental Status: She is alert and oriented to person, place, and time.  Psychiatric:        Behavior: Behavior normal.      No results found for any visits on 05/17/22.    The 10-year ASCVD risk score (Arnett DK, et al., 2019) is: 8.2%    Assessment & Plan:   Problem List Items Addressed This Visit       Cardiovascular and Mediastinum   Essential hypertension    He has actually been running quite low and is on the lower end of normal today she is currently holding amlodipine and her valsartan.  Consider restarting if blood pressure start increasing again.  She is working diligently to try to keep up her fluid intake.        Genitourinary   AKI (acute kidney injury) ([place])    He is really trying hard to keep her hydration status up.  She is trying  to increase fluids and has been taking the Imodium twice a day which is helping.        Other   Hypocalcemia - Primary   Abdominal pain    Is having some increased epigastric pain I am wondering if it could even be from the cheese on the pizza she does not eat a lot of dairy in general.  Okay to use Tums if it is not improving or getting worse then please let us know.  She will follow-up at the cancer center tomorrow.      Other Visit Diagnoses     Low magnesium level       Change in voice          Change in voice-we discussed possible referral to ENT.  The best way to evaluate this would be for her to have a scope that could be done in office to take a look at the vocal cords to see what might be causing the change in her voice.  We discussed potential causes.  She said she will let me know in a few weeks after her chemo treatment if she is ready to proceed with referral.  She would like to be seen here locally so I recommended [facility].  No follow-ups on file.   I spent 40 minutes on the day of the encounter to include pre-visit record review, face-to-face time with the patient and post visit ordering of test.   Beatrice Lecher, MD

## 2022-05-17 NOTE — Telephone Encounter (Signed)
I spoke to Maria Romero this morning. She states she saw Dr. Marin Olp yesterday and d/t her Magnesium levels being low on recent labs, she was given Magnesium through her port at his office. She is also on a Magnesium tablet that she started today. She is also aware to stay hydrated.

## 2022-05-17 NOTE — Telephone Encounter (Signed)
-----   Message from Volanda Napoleon, MD sent at 05/17/2022  9:59 AM EST ----- Call and let her know that the cancer marker is now down to 32.  Clearly, surgery did a wonderful job with her.  Laurey Arrow

## 2022-05-17 NOTE — Assessment & Plan Note (Signed)
He is really trying hard to keep her hydration status up.  She is trying to increase fluids and has been taking the Imodium twice a day which is helping.

## 2022-05-17 NOTE — Progress Notes (Unsigned)
Patient seen after her surgery on 04/20/22. She will restart treatment this week.   Oncology Nurse Navigator Documentation     05/17/2022    8:45 AM  Oncology Nurse Navigator Flowsheets  Phase of Treatment Chemo  Navigator Follow Up Date: 06/08/2022  Navigator Follow Up Reason: Follow-up Appointment;Chemotherapy  Navigator Location [facility]  Navigator Encounter Type Appt/Treatment Plan Review  Patient Visit Type MedOnc  Treatment Phase Active Tx  Barriers/Navigation Needs Coordination of Care  Interventions None Required  Acuity Level 2-Minimal Needs (1-2 Barriers Identified)  Support Groups/Services Friends and Family  Time Spent with Patient 15

## 2022-05-17 NOTE — Patient Instructions (Signed)
We can refer to [facility] when you are ready.

## 2022-05-17 NOTE — Assessment & Plan Note (Signed)
He has actually been running quite low and is on the lower end of normal today she is currently holding amlodipine and her valsartan.  Consider restarting if blood pressure start increasing again.  She is working diligently to try to keep up her fluid intake.

## 2022-05-18 ENCOUNTER — Inpatient Hospital Stay: Payer: Medicare Other

## 2022-05-18 ENCOUNTER — Other Ambulatory Visit: Payer: Self-pay | Admitting: Pharmacist

## 2022-05-18 ENCOUNTER — Encounter: Payer: Self-pay | Admitting: Hematology & Oncology

## 2022-05-18 VITALS — BP 119/60 | HR 71 | Temp 97.6°F | Resp 17

## 2022-05-18 DIAGNOSIS — Z5111 Encounter for antineoplastic chemotherapy: Secondary | ICD-10-CM | POA: Diagnosis not present

## 2022-05-18 DIAGNOSIS — K1379 Other lesions of oral mucosa: Secondary | ICD-10-CM | POA: Diagnosis not present

## 2022-05-18 DIAGNOSIS — Z933 Colostomy status: Secondary | ICD-10-CM | POA: Diagnosis not present

## 2022-05-18 DIAGNOSIS — C561 Malignant neoplasm of right ovary: Secondary | ICD-10-CM

## 2022-05-18 DIAGNOSIS — I10 Essential (primary) hypertension: Secondary | ICD-10-CM | POA: Diagnosis not present

## 2022-05-18 DIAGNOSIS — M7989 Other specified soft tissue disorders: Secondary | ICD-10-CM | POA: Diagnosis not present

## 2022-05-18 DIAGNOSIS — Z5189 Encounter for other specified aftercare: Secondary | ICD-10-CM | POA: Diagnosis not present

## 2022-05-18 MED ORDER — SODIUM CHLORIDE 0.9 % IV SOLN
270.0000 mg | Freq: Once | INTRAVENOUS | Status: AC
  Administered 2022-05-18: 250 mg via INTRAVENOUS
  Filled 2022-05-18: qty 25

## 2022-05-18 MED ORDER — SODIUM CHLORIDE 0.9 % IV SOLN
10.0000 mg | Freq: Once | INTRAVENOUS | Status: AC
  Administered 2022-05-18: 10 mg via INTRAVENOUS
  Filled 2022-05-18: qty 10

## 2022-05-18 MED ORDER — SODIUM CHLORIDE 0.9% FLUSH
10.0000 mL | INTRAVENOUS | Status: DC | PRN
  Administered 2022-05-18: 10 mL

## 2022-05-18 MED ORDER — SODIUM CHLORIDE 0.9 % IV SOLN
50.0000 mg/m2 | Freq: Once | INTRAVENOUS | Status: AC
  Administered 2022-05-18: 80 mg via INTRAVENOUS
  Filled 2022-05-18: qty 8

## 2022-05-18 MED ORDER — PALONOSETRON HCL INJECTION 0.25 MG/5ML
0.2500 mg | Freq: Once | INTRAVENOUS | Status: AC
  Administered 2022-05-18: 0.25 mg via INTRAVENOUS
  Filled 2022-05-18: qty 5

## 2022-05-18 MED ORDER — SODIUM CHLORIDE 0.9 % IV SOLN
150.0000 mg | Freq: Once | INTRAVENOUS | Status: AC
  Administered 2022-05-18: 150 mg via INTRAVENOUS
  Filled 2022-05-18: qty 150

## 2022-05-18 MED ORDER — HEPARIN SOD (PORK) LOCK FLUSH 100 UNIT/ML IV SOLN
500.0000 [IU] | Freq: Once | INTRAVENOUS | Status: AC | PRN
  Administered 2022-05-18: 500 [IU]

## 2022-05-18 MED ORDER — SODIUM CHLORIDE 0.9 % IV SOLN: Freq: Once | INTRAVENOUS | Status: AC

## 2022-05-18 NOTE — Patient Instructions (Signed)
[facility]  Discharge Instructions: Thank you for choosing [facility] to provide your oncology and hematology care.   If you have a lab appointment with the [facility], please go directly to the [facility] and check in at the registration area.  Wear comfortable clothing and clothing appropriate for easy access to any Portacath or PICC line.   We strive to give you quality time with your provider. You may need to reschedule your appointment if you arrive late (15 or more minutes).  Arriving late affects you and other patients whose appointments are after yours.  Also, if you miss three or more appointments without notifying the office, you may be dismissed from the clinic at the provider's discretion.      For prescription refill requests, have your pharmacy contact our office and allow 72 hours for refills to be completed.    Today you received the following chemotherapy and/or immunotherapy agents Taxotere and Carboplatin.   To help prevent nausea and vomiting after your treatment, we encourage you to take your nausea medication as directed.  BELOW ARE SYMPTOMS THAT SHOULD BE REPORTED IMMEDIATELY: *FEVER GREATER THAN 100.4 F (38 C) OR HIGHER *CHILLS OR SWEATING *NAUSEA AND VOMITING THAT IS NOT CONTROLLED WITH YOUR NAUSEA MEDICATION *UNUSUAL SHORTNESS OF BREATH *UNUSUAL BRUISING OR BLEEDING *URINARY PROBLEMS (pain or burning when urinating, or frequent urination) *BOWEL PROBLEMS (unusual diarrhea, constipation, pain near the anus) TENDERNESS IN MOUTH AND THROAT WITH OR WITHOUT PRESENCE OF ULCERS (sore throat, sores in mouth, or a toothache) UNUSUAL RASH, SWELLING OR PAIN  UNUSUAL VAGINAL DISCHARGE OR ITCHING   Items with * indicate a potential emergency and should be followed up as soon as possible or go to the Emergency Department if any problems should occur.  Please show the CHEMOTHERAPY ALERT CARD or IMMUNOTHERAPY ALERT  CARD at check-in to the Emergency Department and triage nurse. Should you have questions after your visit or need to cancel or reschedule your appointment, please contact [facility]  [phone] and follow the prompts.  Office hours are 8:00 a.m. to 4:30 p.m. Monday - Friday. Please note that voicemails left after 4:00 p.m. may not be returned until the following business day.  We are closed weekends and major holidays. You have access to a nurse at all times for urgent questions. Please call the main number to the clinic [phone] and follow the prompts.  For any non-urgent questions, you may also contact your provider using MyChart. We now offer e-Visits for anyone 54 and older to request care online for non-urgent symptoms. For details visit [email or web address].   Also download the MyChart app! Go to the app store, search "MyChart", open the app, select [facility], and log in with your MyChart username and password.

## 2022-05-18 NOTE — Progress Notes (Signed)
Per Dr. Marin Olp, it's OK to treat today without a CBC with Diff. A CBC was drawn. Ok to treat with the Creatinine level. Pharmacy notified.

## 2022-05-18 NOTE — Progress Notes (Signed)
Ok to use bilirubin from 05/03/22 for treatment on 05/18/22.

## 2022-05-19 ENCOUNTER — Inpatient Hospital Stay (HOSPITAL_BASED_OUTPATIENT_CLINIC_OR_DEPARTMENT_OTHER): Payer: Medicare Other | Admitting: Gynecologic Oncology

## 2022-05-19 ENCOUNTER — Other Ambulatory Visit: Payer: Self-pay

## 2022-05-19 ENCOUNTER — Inpatient Hospital Stay: Payer: Medicare Other

## 2022-05-19 ENCOUNTER — Encounter: Payer: Self-pay | Admitting: Gynecologic Oncology

## 2022-05-19 VITALS — BP 123/67 | HR 64 | Resp 16 | Wt 115.0 lb

## 2022-05-19 DIAGNOSIS — N179 Acute kidney failure, unspecified: Secondary | ICD-10-CM

## 2022-05-19 DIAGNOSIS — Z7189 Other specified counseling: Secondary | ICD-10-CM

## 2022-05-19 DIAGNOSIS — C569 Malignant neoplasm of unspecified ovary: Secondary | ICD-10-CM

## 2022-05-19 DIAGNOSIS — Z90722 Acquired absence of ovaries, bilateral: Secondary | ICD-10-CM

## 2022-05-19 DIAGNOSIS — Z90710 Acquired absence of both cervix and uterus: Secondary | ICD-10-CM

## 2022-05-19 DIAGNOSIS — Z932 Ileostomy status: Secondary | ICD-10-CM

## 2022-05-19 NOTE — Patient Instructions (Signed)
It was good to see you today.  You are healing well from surgery.  Remember, no heavy lifting for at least 6 weeks and nothing in the vagina for at least 10.  I will see you back in several months.  At that time, and after you have finished chemotherapy, we will repeat a CT scan and plan to give you contrast rectally to make sure that your colon has healed.  After that, hopefully we can plan for surgery to reverse your ileostomy.

## 2022-05-20 ENCOUNTER — Inpatient Hospital Stay: Payer: Medicare Other

## 2022-05-20 ENCOUNTER — Other Ambulatory Visit: Payer: Self-pay | Admitting: Hematology & Oncology

## 2022-05-20 VITALS — BP 120/56 | HR 57 | Temp 97.6°F | Resp 16

## 2022-05-20 DIAGNOSIS — C561 Malignant neoplasm of right ovary: Secondary | ICD-10-CM

## 2022-05-20 DIAGNOSIS — I10 Essential (primary) hypertension: Secondary | ICD-10-CM | POA: Diagnosis not present

## 2022-05-20 DIAGNOSIS — K1379 Other lesions of oral mucosa: Secondary | ICD-10-CM | POA: Diagnosis not present

## 2022-05-20 DIAGNOSIS — Z5111 Encounter for antineoplastic chemotherapy: Secondary | ICD-10-CM | POA: Diagnosis not present

## 2022-05-20 DIAGNOSIS — Z5189 Encounter for other specified aftercare: Secondary | ICD-10-CM | POA: Diagnosis not present

## 2022-05-20 DIAGNOSIS — Z933 Colostomy status: Secondary | ICD-10-CM | POA: Diagnosis not present

## 2022-05-20 DIAGNOSIS — M7989 Other specified soft tissue disorders: Secondary | ICD-10-CM | POA: Diagnosis not present

## 2022-05-20 DIAGNOSIS — D702 Other drug-induced agranulocytosis: Secondary | ICD-10-CM

## 2022-05-20 LAB — CMP (CANCER CENTER ONLY)
ALT: 43 U/L (ref 0–44)
AST: 27 U/L (ref 15–41)
Albumin: 4.3 g/dL (ref 3.5–5.0)
Alkaline Phosphatase: 72 U/L (ref 38–126)
Anion gap: 10 (ref 5–15)
BUN: 35 mg/dL — ABNORMAL HIGH (ref 8–23)
CO2: 20 mmol/L — ABNORMAL LOW (ref 22–32)
Calcium: 9.6 mg/dL (ref 8.9–10.3)
Chloride: 107 mmol/L (ref 98–111)
Creatinine: 1.01 mg/dL — ABNORMAL HIGH (ref 0.44–1.00)
GFR, Estimated: 59 mL/min — ABNORMAL LOW (ref >60)
Glucose, Bld: 111 mg/dL — ABNORMAL HIGH (ref 70–99)
Potassium: 4.3 mmol/L (ref 3.5–5.1)
Sodium: 137 mmol/L (ref 135–145)
Total Bilirubin: 0.3 mg/dL (ref 0.3–1.2)
Total Protein: 6.7 g/dL (ref 6.5–8.1)

## 2022-05-20 MED ORDER — HEPARIN SOD (PORK) LOCK FLUSH 100 UNIT/ML IV SOLN
500.0000 [IU] | Freq: Once | INTRAVENOUS | Status: AC
  Administered 2022-05-20: 500 [IU] via INTRAVENOUS

## 2022-05-20 MED ORDER — SODIUM CHLORIDE 0.9% FLUSH
10.0000 mL | INTRAVENOUS | Status: DC | PRN
  Administered 2022-05-20: 10 mL via INTRAVENOUS

## 2022-05-20 MED ORDER — PEGFILGRASTIM INJECTION 6 MG/0.6ML ~~LOC~~
6.0000 mg | PREFILLED_SYRINGE | Freq: Once | SUBCUTANEOUS | Status: AC
  Administered 2022-05-20: 6 mg via SUBCUTANEOUS
  Filled 2022-05-20: qty 0.6

## 2022-05-25 ENCOUNTER — Telehealth: Payer: Self-pay

## 2022-05-25 ENCOUNTER — Ambulatory Visit: Payer: Self-pay

## 2022-05-25 DIAGNOSIS — Z933 Colostomy status: Secondary | ICD-10-CM | POA: Diagnosis not present

## 2022-05-25 NOTE — Telephone Encounter (Signed)
Received call from [place] with [facility] requesting referral from Dr Marin Olp for PT. Per Dr Marin Olp, he does not feel PT is necessary at this time. This note, along with note from [place], [place] from 04/15/2022 faxed to [facility]. dph

## 2022-05-25 NOTE — Patient Instructions (Addendum)
Visit Information  Thank you for taking time to visit with me today. Please don't hesitate to contact me if I can be of assistance to you.   Following are the goals we discussed today:   Goals Addressed             This Visit's Progress    Care Coordination Activities       Interventions Today    Flowsheet Row Most Recent Value  Chronic Disease   Chronic disease during today's visit Other  [Recent hospitalization]  General Interventions   General Interventions Discussed/Reviewed General Interventions Reviewed, Doctor Visits  Doctor Visits Discussed/Reviewed Specialist, PCP, Doctor Visits Reviewed  PCP/Specialist Visits Compliance with follow-up visit  [encouraged to continue to follow up with providers as scheduled]  Pharmacy Interventions   Pharmacy Dicussed/Reviewed Pharmacy Topics Reviewed  [medications reviewed.. patient denies any questions or concerns]             Our next appointment is by telephone on 06/23/22 at 2:00 pm  Please call the care guide team at [phone] if you need to cancel or reschedule your appointment.   If you are experiencing a Mental Health or [place] or need someone to talk to, please call the Suicide and Crisis Lifeline: [place], RN, MSN, BSN, [place] [phone]

## 2022-05-25 NOTE — Patient Outreach (Signed)
  Care Coordination   Follow Up Visit Note   05/25/2022 Name: TANE BIEGLER MRN: [id number] DOB: September 16, 1950  ORTENCIA ASKARI is a 72 y.o. year old female who sees Methe[place], Rene Kocher, MD for primary care. I spoke with  Anderson Malta by phone today.  What matters to the patients health and wellness today?  ovarian ca post hospitallization(ileostomy) post hospitalization    Goals Addressed             This Visit's Progress    Care Coordination Activities       Interventions Today    Flowsheet Row Most Recent Value  Chronic Disease   Chronic disease during today's visit Other  [Recent hospitalization]  General Interventions   General Interventions Discussed/Reviewed General Interventions Reviewed, Doctor Visits  Doctor Visits Discussed/Reviewed Specialist, PCP, Doctor Visits Reviewed  PCP/Specialist Visits Compliance with follow-up visit  [encouraged to continue to follow up with providers as scheduled]  Pharmacy Interventions   Pharmacy Dicussed/Reviewed Pharmacy Topics Reviewed  [medications reviewed.. patient denies any questions or concerns]             SDOH assessments and interventions completed:  No  Care Coordination Interventions:  Yes, provided   Follow up plan: Follow up call scheduled for 06/23/22    Encounter Outcome:  Pt. Visit Completed   Thea Silversmith, RN, MSN, BSN, [place] Coordinator [phone]

## 2022-05-27 ENCOUNTER — Telehealth: Payer: Self-pay | Admitting: *Deleted

## 2022-05-27 NOTE — Telephone Encounter (Signed)
Maria Romero w/[facility] called and wanted to notify Dr. Madilyn Fireman that when she was with Maria Romero yesterday her HR was elevated. She reports that the patient had been walking around and was a little dehydrated.   Maria Romero did check her HR before leaving and reports that it was at 100.   She stated that she had spoken to the patient about food choices and she asked for a Verbal Order for her to have a Dietician to come out to help pt with this.   Verbal Order given.

## 2022-05-27 NOTE — Telephone Encounter (Signed)
Agree with below. Addendum:    I believe that she has limits with moving and including, toileting, bathing, feeding, dressing and grooming. I believe the power wheelchair is needed for pt to be able to perform ADL's in her home

## 2022-05-30 ENCOUNTER — Other Ambulatory Visit: Payer: Self-pay | Admitting: Gynecologic Oncology

## 2022-05-30 ENCOUNTER — Telehealth: Payer: Self-pay

## 2022-05-30 DIAGNOSIS — Z933 Colostomy status: Secondary | ICD-10-CM | POA: Diagnosis not present

## 2022-05-30 DIAGNOSIS — N898 Other specified noninflammatory disorders of vagina: Secondary | ICD-10-CM

## 2022-05-30 MED ORDER — FLUCONAZOLE 100 MG PO TABS: 100.0000 mg | ORAL_TABLET | Freq: Once | ORAL | 0 refills | Status: AC

## 2022-05-30 NOTE — Telephone Encounter (Signed)
Patient called to report vaginal discharge starting on 05/27/22. Small amount of white, thick, discharge with tinges of brownish drainage which she describes as blood. Unable to distinguish if there is an odor secondary to ileostomy odor. No dysuria, pelvic or abdominal pain. Denies fever or chills. Patient has not treated with OTC medications.   Patient asking if this could be related to procedure on 04/20/22.  Information sent to Dr. Berline Lopes for review of information.

## 2022-06-01 DIAGNOSIS — Z933 Colostomy status: Secondary | ICD-10-CM | POA: Diagnosis not present

## 2022-06-07 ENCOUNTER — Telehealth: Payer: Self-pay | Admitting: *Deleted

## 2022-06-07 NOTE — Telephone Encounter (Signed)
Patient called the office back and stated "I called the office on 2/19 about vaginal discharge. Dr Berline Lopes sent in one diflucan pill. I took it and the discharge is better, but I still have it. It now is clear with a little pink or green; but mostly thick clear. I am having to wear a panty liner and changing it twice a day. I have no odor, N/V, fever/chills. I have no urinary issues. " Explained that the message would be given to Dr Chancy Hurter APP and the office would call her back. Pharmacy verified with the patient.

## 2022-06-07 NOTE — Telephone Encounter (Signed)
I don't know what we are treating. I'd like someone (either myself or Melissa) to examine her before we send in any prescriptions.

## 2022-06-07 NOTE — Telephone Encounter (Signed)
I spoke to Ms. Elsberry, she repeated the same S&S from earlier call. Vaginal clear with a tint of green discharge. No odor, but hard to tell with the Iliostomy in place. No pelvic pain no fever/chills no vaginal itching. She has not tried anything OTC and will be glad to pick up something if she needs to. She states she can not come in tomorrow d/t having chemo in [facility] and on Thursday has an injection. She prefers something a little stronger than Diflucan be sent to pharmacy since that did not get rid of it.

## 2022-06-08 ENCOUNTER — Inpatient Hospital Stay: Payer: Medicare Other

## 2022-06-08 ENCOUNTER — Inpatient Hospital Stay: Payer: Medicare Other | Admitting: Licensed Clinical Social Worker

## 2022-06-08 ENCOUNTER — Inpatient Hospital Stay: Payer: Medicare Other | Admitting: Hematology & Oncology

## 2022-06-08 ENCOUNTER — Encounter: Payer: Self-pay | Admitting: *Deleted

## 2022-06-08 ENCOUNTER — Other Ambulatory Visit: Payer: Self-pay | Admitting: *Deleted

## 2022-06-08 VITALS — BP 122/81 | HR 72 | Temp 98.2°F | Resp 18 | Ht 63.0 in | Wt 113.0 lb

## 2022-06-08 VITALS — BP 109/59 | HR 71 | Resp 17

## 2022-06-08 DIAGNOSIS — D702 Other drug-induced agranulocytosis: Secondary | ICD-10-CM

## 2022-06-08 DIAGNOSIS — M7989 Other specified soft tissue disorders: Secondary | ICD-10-CM | POA: Diagnosis not present

## 2022-06-08 DIAGNOSIS — D6481 Anemia due to antineoplastic chemotherapy: Secondary | ICD-10-CM

## 2022-06-08 DIAGNOSIS — C569 Malignant neoplasm of unspecified ovary: Secondary | ICD-10-CM

## 2022-06-08 DIAGNOSIS — C561 Malignant neoplasm of right ovary: Secondary | ICD-10-CM

## 2022-06-08 DIAGNOSIS — K1379 Other lesions of oral mucosa: Secondary | ICD-10-CM | POA: Diagnosis not present

## 2022-06-08 DIAGNOSIS — Z5111 Encounter for antineoplastic chemotherapy: Secondary | ICD-10-CM | POA: Diagnosis not present

## 2022-06-08 DIAGNOSIS — I10 Essential (primary) hypertension: Secondary | ICD-10-CM | POA: Diagnosis not present

## 2022-06-08 DIAGNOSIS — Z5189 Encounter for other specified aftercare: Secondary | ICD-10-CM | POA: Diagnosis not present

## 2022-06-08 DIAGNOSIS — Z933 Colostomy status: Secondary | ICD-10-CM | POA: Diagnosis not present

## 2022-06-08 DIAGNOSIS — N6311 Unspecified lump in the right breast, upper outer quadrant: Secondary | ICD-10-CM

## 2022-06-08 DIAGNOSIS — Z95828 Presence of other vascular implants and grafts: Secondary | ICD-10-CM

## 2022-06-08 LAB — CBC WITH DIFFERENTIAL (CANCER CENTER ONLY)
Abs Immature Granulocytes: 0.09 10*3/uL — ABNORMAL HIGH (ref 0.00–0.07)
Basophils Absolute: 0 10*3/uL (ref 0.0–0.1)
Basophils Relative: 1 %
Eosinophils Absolute: 0 10*3/uL (ref 0.0–0.5)
Eosinophils Relative: 0 %
HCT: 33.8 % — ABNORMAL LOW (ref 36.0–46.0)
Hemoglobin: 11 g/dL — ABNORMAL LOW (ref 12.0–15.0)
Immature Granulocytes: 1 %
Lymphocytes Relative: 18 %
Lymphs Abs: 1.3 10*3/uL (ref 0.7–4.0)
MCH: 32.5 pg (ref 26.0–34.0)
MCHC: 32.5 g/dL (ref 30.0–36.0)
MCV: 100 fL (ref 80.0–100.0)
Monocytes Absolute: 0.6 10*3/uL (ref 0.1–1.0)
Monocytes Relative: 8 %
Neutro Abs: 5.3 10*3/uL (ref 1.7–7.7)
Neutrophils Relative %: 72 %
Platelet Count: 247 10*3/uL (ref 150–400)
RBC: 3.38 MIL/uL — ABNORMAL LOW (ref 3.87–5.11)
RDW: 16.3 % — ABNORMAL HIGH (ref 11.5–15.5)
WBC Count: 7.4 10*3/uL (ref 4.0–10.5)
nRBC: 0 % (ref 0.0–0.2)

## 2022-06-08 LAB — CMP (CANCER CENTER ONLY)
ALT: 23 U/L (ref 0–44)
AST: 18 U/L (ref 15–41)
Albumin: 4.3 g/dL (ref 3.5–5.0)
Alkaline Phosphatase: 89 U/L (ref 38–126)
Anion gap: 11 (ref 5–15)
BUN: 18 mg/dL (ref 8–23)
CO2: 23 mmol/L (ref 22–32)
Calcium: 10.2 mg/dL (ref 8.9–10.3)
Chloride: 105 mmol/L (ref 98–111)
Creatinine: 1.14 mg/dL — ABNORMAL HIGH (ref 0.44–1.00)
GFR, Estimated: 51 mL/min — ABNORMAL LOW (ref >60)
Glucose, Bld: 106 mg/dL — ABNORMAL HIGH (ref 70–99)
Potassium: 3.8 mmol/L (ref 3.5–5.1)
Sodium: 139 mmol/L (ref 135–145)
Total Bilirubin: 0.4 mg/dL (ref 0.3–1.2)
Total Protein: 6.5 g/dL (ref 6.5–8.1)

## 2022-06-08 LAB — IRON AND IRON BINDING CAPACITY (CC-WL,HP ONLY)
Iron: 89 ug/dL (ref 28–170)
Saturation Ratios: 30 % (ref 10.4–31.8)
TIBC: 295 ug/dL (ref 250–450)
UIBC: 206 ug/dL (ref 148–442)

## 2022-06-08 LAB — MAGNESIUM: Magnesium: 1 mg/dL — ABNORMAL LOW (ref 1.7–2.4)

## 2022-06-08 LAB — SAMPLE TO BLOOD BANK

## 2022-06-08 LAB — RETICULOCYTES
Immature Retic Fract: 14.4 % (ref 2.3–15.9)
RBC.: 3.39 MIL/uL — ABNORMAL LOW (ref 3.87–5.11)
Retic Count, Absolute: 65.8 10*3/uL (ref 19.0–186.0)
Retic Ct Pct: 1.9 % (ref 0.4–3.1)

## 2022-06-08 LAB — FERRITIN: Ferritin: 588 ng/mL — ABNORMAL HIGH (ref 11–307)

## 2022-06-08 MED ORDER — SODIUM CHLORIDE 0.9 % IV SOLN: Freq: Once | INTRAVENOUS | Status: AC

## 2022-06-08 MED ORDER — SODIUM CHLORIDE 0.9% FLUSH
10.0000 mL | INTRAVENOUS | Status: DC | PRN
  Administered 2022-06-08: 10 mL

## 2022-06-08 MED ORDER — SODIUM CHLORIDE 0.9 % IV SOLN
303.0000 mg | Freq: Once | INTRAVENOUS | Status: AC
  Administered 2022-06-08: 300 mg via INTRAVENOUS
  Filled 2022-06-08: qty 30

## 2022-06-08 MED ORDER — PALONOSETRON HCL INJECTION 0.25 MG/5ML
0.2500 mg | Freq: Once | INTRAVENOUS | Status: AC
  Administered 2022-06-08: 0.25 mg via INTRAVENOUS
  Filled 2022-06-08: qty 5

## 2022-06-08 MED ORDER — SODIUM CHLORIDE 0.9 % IV SOLN
15.0000 mg/kg | Freq: Once | INTRAVENOUS | Status: AC
  Administered 2022-06-08: 800 mg via INTRAVENOUS
  Filled 2022-06-08: qty 32

## 2022-06-08 MED ORDER — MAGNESIUM SULFATE 2 GM/50ML IV SOLN
2.0000 g | Freq: Once | INTRAVENOUS | Status: AC
  Administered 2022-06-08: 2 g via INTRAVENOUS
  Filled 2022-06-08: qty 50

## 2022-06-08 MED ORDER — SODIUM CHLORIDE 0.9 % IV SOLN
50.0000 mg/m2 | Freq: Once | INTRAVENOUS | Status: AC
  Administered 2022-06-08: 80 mg via INTRAVENOUS
  Filled 2022-06-08: qty 8

## 2022-06-08 MED ORDER — HEPARIN SOD (PORK) LOCK FLUSH 100 UNIT/ML IV SOLN
500.0000 [IU] | Freq: Once | INTRAVENOUS | Status: AC | PRN
  Administered 2022-06-08: 500 [IU]

## 2022-06-08 MED ORDER — SODIUM CHLORIDE 0.9 % IV SOLN
10.0000 mg | Freq: Once | INTRAVENOUS | Status: AC
  Administered 2022-06-08: 10 mg via INTRAVENOUS
  Filled 2022-06-08: qty 10

## 2022-06-08 MED ORDER — SODIUM CHLORIDE 0.9 % IV SOLN: 2.0000 g | Freq: Once | INTRAVENOUS | Status: DC

## 2022-06-08 MED ORDER — SODIUM CHLORIDE 0.9 % IV SOLN
150.0000 mg | Freq: Once | INTRAVENOUS | Status: AC
  Administered 2022-06-08: 150 mg via INTRAVENOUS
  Filled 2022-06-08: qty 150

## 2022-06-08 NOTE — Progress Notes (Signed)
Hematology and Oncology Follow Up Visit  Maria Romero ZP:[id number] Jun 02, 1950 72 y.o. 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  She is doing quite well with her adjuvant chemotherapy with the Taxotere/carboplatinum.  Her CA-125 was down to 32.  I think all this was from her surgery.  She has tolerated treatment well.  She does have some mouth sores.  She is using Veterinary surgeon.  I told her to rinse her mouth out with water and baking soda.  She has had no issues with fever.  She has a colostomy.  Hopefully this can be reversed..  She has had no problems urinating.  There is been no issues with rashes.  She has had no leg swelling.  She has had no nausea or vomiting.  She is eating pretty well.  Her weight is holding steady.  Overall, I would have said that her performance status is probably ECOG 1.     Wt Readings from Last 3 Encounters:  06/08/22 113 lb (51.3 kg)  05/19/22 115 lb (52.2 kg)  05/17/22 116 lb (52.6 kg)    Medications:  Current Outpatient Medications:    atorvastatin (LIPITOR) 40 MG tablet, TAKE ONE TABLET BY MOUTH EVERY NIGHT AT BEDTIME, Disp: 90 tablet, Rfl: 3   Calcium-Vitamin D-Vitamin K (VIACTIV PO), Take 1 tablet by mouth in the morning and at bedtime., Disp: , Rfl:    cholecalciferol (VITAMIN D3) 25 MCG (1000 UNIT) tablet, Take 1,000 Units by mouth daily., Disp: , Rfl:    lidocaine-prilocaine (EMLA) cream, Apply 1 Application topically as needed (for port access)., Disp: , Rfl:    loperamide (IMODIUM A-D) 2 MG tablet, Take 1 tablet (2 mg total) by mouth 2 (two) times daily. Take 1 extra during the day if  output high, Disp: 30 tablet, Rfl: 0   magic mouthwash (nystatin, lidocaine, diphenhydrAMINE, alum & mag hydroxide) suspension, Swish and spit 5 mLs 4 (four) times daily as needed for mouth pain., Disp: 240 mL, Rfl: 1   magnesium oxide (MAG-OX) 400 (240 Mg) MG tablet, Take 1 tablet (400 mg total) by mouth daily., Disp: 30 tablet, Rfl: 3   omeprazole (PRILOSEC) 20 MG capsule, Take 1 capsule (20 mg total) by mouth daily. (Patient taking differently: Take 20 mg by mouth daily before breakfast.), Disp: 90 capsule, Rfl: 2   ondansetron (ZOFRAN) 8 MG tablet, Take 1 tablet (8 mg total) by mouth every 8 (eight) hours as needed for nausea or vomiting. Start on the third day after chemotherapy., Disp: 30 tablet, Rfl: 1   prochlorperazine (COMPAZINE) 10 MG tablet, Take 1 tablet (10 mg total) by mouth every 6 (six) hours as needed for nausea or vomiting., Disp: 30 tablet, Rfl: 1   rivaroxaban (XARELTO) 10 MG TABS tablet, Take 1 tablet (10 mg total) by mouth daily., Disp: 28 tablet, Rfl: 0   TYLENOL 500 MG tablet, Take 500-1,000 mg by mouth every 6 (six) hours as needed for mild pain or headache., Disp: , Rfl:   Allergies:  Allergies  Allergen Reactions   Fluvastatin Sodium Other (See Comments)    Myalgias    Past Medical History, Surgical history, Social history, and Family  History were reviewed and updated.  Review of Systems: Review of Systems  Constitutional: Negative.   HENT:  Negative.    Eyes: Negative.   Respiratory: Negative.    Cardiovascular: Negative.   Gastrointestinal:  Negative for abdominal pain.  Genitourinary:  Negative for pelvic pain.   Musculoskeletal: Negative.   Skin: Negative.   Neurological: Negative.   Hematological: Negative.   Psychiatric/Behavioral: Negative.      Physical Exam:  height is '5\' 3"'$  (1.6 m) and weight is 113 lb (51.3 kg). Her oral temperature is 98.2 F (36.8 C). Her blood pressure is 122/81 and her pulse is 72. Her respiration is 18 and oxygen  saturation is 100%.   Wt Readings from Last 3 Encounters:  06/08/22 113 lb (51.3 kg)  05/19/22 115 lb (52.2 kg)  05/17/22 116 lb (52.6 kg)    Physical Exam Vitals reviewed.  HENT:     Head: Normocephalic and atraumatic.  Eyes:     Pupils: Pupils are equal, round, and reactive to light.  Cardiovascular:     Rate and Rhythm: Normal rate and regular rhythm.     Heart sounds: Normal heart sounds.  Pulmonary:     Effort: Pulmonary effort is normal.     Breath sounds: Normal breath sounds.  Chest:    Abdominal:     General: Bowel sounds are normal.     Palpations: Abdomen is soft.     Comments: Abdominal exam shows a colostomy in the right lower quadrant.  There is a well-healed laparotomy scar.  She has no fluid wave.  There is no palpable abdominal mass.  There is no palpable liver or spleen tip.  Is scantly distended  Musculoskeletal:        General: No tenderness or deformity. Normal range of motion.     Cervical back: Normal range of motion.  Lymphadenopathy:     Cervical: No cervical adenopathy.  Skin:    General: Skin is warm and dry.     Findings: No erythema or rash.  Neurological:     Mental Status: She is alert and oriented to person, place, and time.  Psychiatric:        Behavior: Behavior normal.        Thought Content: Thought content normal.        Judgment: Judgment normal.      Lab Results  Component Value Date   WBC 7.4 06/08/2022   HGB 11.0 (L) 06/08/2022   HCT 33.8 (L) 06/08/2022   MCV 100.0 06/08/2022   PLT 247 06/08/2022     Chemistry      Component Value Date/Time   NA 139 06/08/2022 0915   K 3.8 06/08/2022 0915   CL 105 06/08/2022 0915   CO2 23 06/08/2022 0915   BUN 18 06/08/2022 0915   CREATININE 1.14 (H) 06/08/2022 0915   CREATININE 0.93 11/03/2021 0000      Component Value Date/Time   CALCIUM 10.2 06/08/2022 0915   ALKPHOS 89 06/08/2022 0915   AST 18 06/08/2022 0915   ALT 23 06/08/2022 0915   BILITOT 0.4 06/08/2022 0915         Impression and Plan: Maria Romero is a very charming 72 year old white female.  She had neoadjuvant chemotherapy.  She really had a tough time with neoadjuvant chemotherapy.  She subsequently underwent surgical resection.  She did still have active disease.    She is on adjuvant chemotherapy.  She has had 1 cycle today.  We will go and  give her a second cycle.  I suspect that she will get 3 cycles.  From what she says, Dr. Berline Lopes will plan to try to do a ostomy reversal.  I will plan to get her back in 3 weeks for her third cycle of treatment.    Volanda Napoleon, MD 2/28/202410:52 AM

## 2022-06-08 NOTE — Progress Notes (Signed)
[facility] CSW Progress Note  Holiday representative met with patient while she was in infusion.  She recalled speaking with CSW in August.  She stated she felt much stronger and has a very positive attitude.  Her faith is strong and she relies on this.  She understands her treatment plan and is hopeful of the future.  Her family remains supportive.  CSW provided contact information.   Rodman Pickle Aysia Lowder, LCSW

## 2022-06-08 NOTE — Patient Instructions (Addendum)
[facility] [place]  Discharge Instructions: Thank you for choosing [facility] to provide your oncology and hematology care.   If you have a lab appointment with the [facility], please go directly to the [facility] and check in at the registration area.  Wear comfortable clothing and clothing appropriate for easy access to any Portacath or PICC line.   We strive to give you quality time with your provider. You may need to reschedule your appointment if you arrive late (15 or more minutes).  Arriving late affects you and other patients whose appointments are after yours.  Also, if you miss three or more appointments without notifying the office, you may be dismissed from the clinic at the provider's discretion.      For prescription refill requests, have your pharmacy contact our office and allow 72 hours for refills to be completed.    Today you received the following chemotherapy and/or immunotherapy agents Taxotere and Carboplatin and Bevacizumab   To help prevent nausea and vomiting after your treatment, we encourage you to take your nausea medication as directed.  BELOW ARE SYMPTOMS THAT SHOULD BE REPORTED IMMEDIATELY: *FEVER GREATER THAN 100.4 F (38 C) OR HIGHER *CHILLS OR SWEATING *NAUSEA AND VOMITING THAT IS NOT CONTROLLED WITH YOUR NAUSEA MEDICATION *UNUSUAL SHORTNESS OF BREATH *UNUSUAL BRUISING OR BLEEDING *URINARY PROBLEMS (pain or burning when urinating, or frequent urination) *BOWEL PROBLEMS (unusual diarrhea, constipation, pain near the anus) TENDERNESS IN MOUTH AND THROAT WITH OR WITHOUT PRESENCE OF ULCERS (sore throat, sores in mouth, or a toothache) UNUSUAL RASH, SWELLING OR PAIN  UNUSUAL VAGINAL DISCHARGE OR ITCHING   Items with * indicate a potential emergency and should be followed up as soon as possible or go to the Emergency Department if any problems should occur.  Please show the CHEMOTHERAPY ALERT CARD or  IMMUNOTHERAPY ALERT CARD at check-in to the Emergency Department and triage nurse. Should you have questions after your visit or need to cancel or reschedule your appointment, please contact [facility]  [phone] and follow the prompts.  Office hours are 8:00 a.m. to 4:30 p.m. Monday - Friday. Please note that voicemails left after 4:00 p.m. may not be returned until the following business day.  We are closed weekends and major holidays. You have access to a nurse at all times for urgent questions. Please call the main number to the clinic [phone] and follow the prompts.  For any non-urgent questions, you may also contact your provider using MyChart. We now offer e-Visits for anyone 39 and older to request care online for non-urgent symptoms. For details visit [email or web address].   Also download the MyChart app! Go to the app store, search "MyChart", open the app, select [facility], and log in with your MyChart username and password.

## 2022-06-08 NOTE — Progress Notes (Signed)
Patient is doing well and tolerating treatment. She will receive cycle two today.   Oncology Nurse Navigator Documentation     06/08/2022   10:00 AM  Oncology Nurse Navigator Flowsheets  Navigator Follow Up Date: 06/29/2022  Navigator Follow Up Reason: Follow-up Appointment;Chemotherapy  Navigator Location [facility]  Navigator Encounter Type Treatment;Appt/Treatment Plan Review  Patient Visit Type MedOnc  Treatment Phase Active Tx  Barriers/Navigation Needs Coordination of Care  Interventions Psycho-Social Support  Acuity Level 2-Minimal Needs (1-2 Barriers Identified)  Support Groups/Services Friends and Family  Time Spent with Patient 15

## 2022-06-09 ENCOUNTER — Inpatient Hospital Stay: Payer: Medicare Other

## 2022-06-09 ENCOUNTER — Other Ambulatory Visit: Payer: Self-pay | Admitting: *Deleted

## 2022-06-09 ENCOUNTER — Other Ambulatory Visit: Payer: Self-pay | Admitting: Family Medicine

## 2022-06-09 ENCOUNTER — Other Ambulatory Visit: Payer: Self-pay

## 2022-06-09 VITALS — BP 121/57 | HR 59 | Temp 98.4°F | Resp 17

## 2022-06-09 DIAGNOSIS — D702 Other drug-induced agranulocytosis: Secondary | ICD-10-CM

## 2022-06-09 DIAGNOSIS — Z1231 Encounter for screening mammogram for malignant neoplasm of breast: Secondary | ICD-10-CM

## 2022-06-09 DIAGNOSIS — I10 Essential (primary) hypertension: Secondary | ICD-10-CM | POA: Diagnosis not present

## 2022-06-09 DIAGNOSIS — C561 Malignant neoplasm of right ovary: Secondary | ICD-10-CM

## 2022-06-09 DIAGNOSIS — K1379 Other lesions of oral mucosa: Secondary | ICD-10-CM | POA: Diagnosis not present

## 2022-06-09 DIAGNOSIS — Z5111 Encounter for antineoplastic chemotherapy: Secondary | ICD-10-CM | POA: Diagnosis not present

## 2022-06-09 DIAGNOSIS — M7989 Other specified soft tissue disorders: Secondary | ICD-10-CM | POA: Diagnosis not present

## 2022-06-09 DIAGNOSIS — Z933 Colostomy status: Secondary | ICD-10-CM | POA: Diagnosis not present

## 2022-06-09 DIAGNOSIS — Z5189 Encounter for other specified aftercare: Secondary | ICD-10-CM | POA: Diagnosis not present

## 2022-06-09 MED ORDER — PEGFILGRASTIM INJECTION 6 MG/0.6ML ~~LOC~~
6.0000 mg | PREFILLED_SYRINGE | Freq: Once | SUBCUTANEOUS | Status: AC
  Administered 2022-06-09: 6 mg via SUBCUTANEOUS
  Filled 2022-06-09: qty 0.6

## 2022-06-09 MED ORDER — DEXAMETHASONE 4 MG PO TABS: 8.0000 mg | ORAL_TABLET | Freq: Every day | ORAL | 0 refills | Status: AC

## 2022-06-09 NOTE — Patient Instructions (Signed)

## 2022-06-09 NOTE — Progress Notes (Signed)
Incorrect encounter

## 2022-06-10 ENCOUNTER — Inpatient Hospital Stay: Payer: Medicare Other | Attending: Hematology & Oncology | Admitting: Gynecologic Oncology

## 2022-06-10 ENCOUNTER — Encounter: Payer: Self-pay | Admitting: *Deleted

## 2022-06-10 ENCOUNTER — Inpatient Hospital Stay: Payer: Medicare Other

## 2022-06-10 ENCOUNTER — Other Ambulatory Visit: Payer: Self-pay

## 2022-06-10 VITALS — BP 133/65 | HR 68 | Temp 97.7°F | Resp 14 | Wt 115.0 lb

## 2022-06-10 DIAGNOSIS — Z803 Family history of malignant neoplasm of breast: Secondary | ICD-10-CM | POA: Diagnosis not present

## 2022-06-10 DIAGNOSIS — C561 Malignant neoplasm of right ovary: Secondary | ICD-10-CM | POA: Diagnosis not present

## 2022-06-10 DIAGNOSIS — Z5111 Encounter for antineoplastic chemotherapy: Secondary | ICD-10-CM | POA: Insufficient documentation

## 2022-06-10 DIAGNOSIS — N898 Other specified noninflammatory disorders of vagina: Secondary | ICD-10-CM | POA: Diagnosis not present

## 2022-06-10 DIAGNOSIS — I10 Essential (primary) hypertension: Secondary | ICD-10-CM | POA: Diagnosis not present

## 2022-06-10 DIAGNOSIS — Z87891 Personal history of nicotine dependence: Secondary | ICD-10-CM | POA: Insufficient documentation

## 2022-06-10 DIAGNOSIS — Z5189 Encounter for other specified aftercare: Secondary | ICD-10-CM | POA: Diagnosis not present

## 2022-06-10 DIAGNOSIS — Z90722 Acquired absence of ovaries, bilateral: Secondary | ICD-10-CM | POA: Diagnosis not present

## 2022-06-10 DIAGNOSIS — Z90710 Acquired absence of both cervix and uterus: Secondary | ICD-10-CM | POA: Insufficient documentation

## 2022-06-10 LAB — WET PREP, GENITAL
Clue Cells Wet Prep HPF POC: NONE SEEN
Sperm: NONE SEEN
Trich, Wet Prep: NONE SEEN
WBC, Wet Prep HPF POC: >=10 — AB (ref <10)
Yeast Wet Prep HPF POC: NONE SEEN

## 2022-06-10 LAB — CA 125: Cancer Antigen (CA) 125: 20.4 U/mL (ref 0.0–38.1)

## 2022-06-10 MED ORDER — ESTRADIOL 0.1 MG/GM VA CREA: TOPICAL_CREAM | VAGINAL | 1 refills | Status: DC

## 2022-06-10 NOTE — Progress Notes (Unsigned)
Patient presents to the office today for evaluation of vaginal spotting/discharge.  She states she has had the bloody discharge off and on for the past 2 weeks.  She states this drainage is intermittent.  She had nothing this a.m. and then feel a could feel the discharge later on during the day.  She states she just finished her seventh cycle of chemo this past Wednesday.  She states her lips are not youngly numb yet and she is using Magic mouthwash.  She has had 2 occurrences of rectal pressure, last being yesterday with nothing coming out of the anus.  She states her ostomy typically leaks around day 5 so she changes the bag.  She reports the discharge as spotting.  She reports emptying her bladder without difficulty.  No hematuria reported or dysuria.  No fever or chills.  Reports having twinges in the right lower quadrant intermittently.  She is excited about her recent labs including her Ca1 2 5 in the 38s.  No lower extremity edema reported.

## 2022-06-10 NOTE — Patient Instructions (Signed)
We will contact you with the results of the sample of vaginal discharge taken today.   On exam, there is mild seperation superficially in the middle of the vaginal cuff. The deeper layers are intact.   Plan to beginning to use vaginal estrogen cream nightly at bedtime for 2 weeks then three times a week after to assist with healing in the vagina. You will need to use a fingertip size amount of cream on your finger and place slightly past the vaginal entrance at bedtime. Do not use the applicator if this comes with the cream.   We will plan on seeing you in the office in several weeks to check healing. Please call the office for any new symptoms or needs.

## 2022-06-11 ENCOUNTER — Encounter: Payer: Self-pay | Admitting: Hematology & Oncology

## 2022-06-11 ENCOUNTER — Encounter: Payer: Self-pay | Admitting: Gynecologic Oncology

## 2022-06-11 DIAGNOSIS — N898 Other specified noninflammatory disorders of vagina: Secondary | ICD-10-CM | POA: Insufficient documentation

## 2022-06-13 ENCOUNTER — Telehealth: Payer: Self-pay | Admitting: Surgery

## 2022-06-13 NOTE — Telephone Encounter (Signed)
Called patient to let her know her wet prep did not show yeast or clue cells. They did see WBC which could be related to the healing process, mild inflammation of the vagina related to age and lack of estrogen. Patient states she started vaginal cream on Saturday and has since noticed and improvement to her vaginal discharge. Denies any other concerns at this time.

## 2022-06-22 ENCOUNTER — Other Ambulatory Visit: Payer: Self-pay | Admitting: Hematology & Oncology

## 2022-06-22 ENCOUNTER — Encounter: Payer: Self-pay | Admitting: *Deleted

## 2022-06-22 NOTE — Progress Notes (Signed)
Received notification from GYN-Onc that ostomy reversal is planned for May. Avastin will need to be held. Notified clinical pharmacist and Dr Marin Olp. Treatment plan updated.   Patient will also need a CT of abdomen and pelvis with IV, oral and rectal contrast about 3 weeks after her treatment on 06/29/22. Dr Marin Olp is aware and will place orders.   Oncology Nurse Navigator Documentation     06/22/2022   10:45 AM  Oncology Nurse Navigator Flowsheets  Navigator Follow Up Date: 06/29/2022  Navigator Follow Up Reason: Follow-up Appointment;Chemotherapy  Navigator Location [facility]  Navigator Encounter Type Appt/Treatment Plan Review  Patient Visit Type MedOnc  Treatment Phase Active Tx  Barriers/Navigation Needs Coordination of Care  Interventions Coordination of Care  Acuity Level 2-Minimal Needs (1-2 Barriers Identified)  Coordination of Care Other  Support Groups/Services Friends and Family  Time Spent with Patient 15

## 2022-06-23 ENCOUNTER — Ambulatory Visit: Payer: Self-pay

## 2022-06-23 NOTE — Patient Instructions (Signed)
Visit Information  Thank you for taking time to visit with me today. Please don't hesitate to contact me if I can be of assistance to you.   Following are the goals we discussed today:   Goals Addressed             This Visit's Progress    Care Coordination Activities       Interventions Today    Flowsheet Row Most Recent Value  Chronic Disease   Chronic disease during today's visit Other  [04/20/22-04/25/22 hysterectomy/ielostoy on 04/20/22-ovarian cancer 05/03/22-05/05/22 hyperkalemia/AKI]  General Interventions   General Interventions Discussed/Reviewed General Interventions Reviewed, Doctor Visits  Doctor Visits Discussed/Reviewed Doctor Visits Discussed  Education Interventions   Education Provided Provided Education  Provided Verbal Education On Other  [encouraged to continue to attend provider visits as scheduled, take medications as prescribed, contact provider with any heatlh questions or concerns.]  Nutrition Interventions   Nutrition Discussed/Reviewed Nutrition Reviewed  [appetite good]             If you are experiencing a Mental Health or [place] or need someone to talk to, please call the Suicide and Crisis Lifeline: [place], RN, MSN, BSN, [name] Coordinator [phone]

## 2022-06-23 NOTE — Patient Outreach (Signed)
  Care Coordination   Follow Up Visit Note   06/23/2022 Name: Maria Romero MRN: [id number] DOB: 1950-06-17  Maria Romero is a 72 y.o. year old female who sees Metheney, Rene Kocher, MD for primary care. I spoke with  Maria Romero by phone today.  What matters to the patients health and wellness today?  [name] reports she continues with chemotherapy. Last round on 06/29/22. She reports she has a good appetite, however unable to eat what she wants due to GI condition-iliostomy reports no problems or concerns at this time. She reports plans to reverse ileostomy in May pending continued progress. She denies any care coordination, resource or disease management needs at this time. Patient to contact RNCM if care coordination needs in the future.   Goals Addressed             This Visit's Progress    Care Coordination Activities       Interventions Today    Flowsheet Row Most Recent Value  Chronic Disease   Chronic disease during today's visit Other  [04/20/22-04/25/22 hysterectomy/ielostoy on 04/20/22-ovarian cancer 05/03/22-05/05/22 hyperkalemia/AKI]  General Interventions   General Interventions Discussed/Reviewed General Interventions Reviewed, Doctor Visits  Doctor Visits Discussed/Reviewed Doctor Visits Discussed  Education Interventions   Education Provided Provided Education  Provided Verbal Education On Other  [encouraged to continue to attend provider visits as scheduled, take medications as prescribed, contact provider with any heatlh questions or concerns.]  Nutrition Interventions   Nutrition Discussed/Reviewed Nutrition Reviewed  [appetite good]             SDOH assessments and interventions completed:  No  Care Coordination Interventions:  Yes, provided   Follow up plan: No further intervention required.   Encounter Outcome:  Pt. Visit Completed   Thea Silversmith, RN, MSN, BSN, [facility] Coordinator [phone]

## 2022-06-29 ENCOUNTER — Inpatient Hospital Stay: Payer: Medicare Other

## 2022-06-29 ENCOUNTER — Encounter: Payer: Self-pay | Admitting: *Deleted

## 2022-06-29 ENCOUNTER — Encounter: Payer: Self-pay | Admitting: Hematology & Oncology

## 2022-06-29 ENCOUNTER — Other Ambulatory Visit: Payer: Self-pay | Admitting: Hematology and Oncology

## 2022-06-29 ENCOUNTER — Other Ambulatory Visit: Payer: Self-pay

## 2022-06-29 ENCOUNTER — Inpatient Hospital Stay: Payer: Medicare Other | Admitting: Hematology & Oncology

## 2022-06-29 VITALS — BP 120/68 | HR 76 | Temp 97.6°F | Resp 18 | Ht 63.0 in | Wt 111.0 lb

## 2022-06-29 DIAGNOSIS — I10 Essential (primary) hypertension: Secondary | ICD-10-CM | POA: Diagnosis not present

## 2022-06-29 DIAGNOSIS — Z87891 Personal history of nicotine dependence: Secondary | ICD-10-CM | POA: Diagnosis not present

## 2022-06-29 DIAGNOSIS — C561 Malignant neoplasm of right ovary: Secondary | ICD-10-CM

## 2022-06-29 DIAGNOSIS — N6311 Unspecified lump in the right breast, upper outer quadrant: Secondary | ICD-10-CM

## 2022-06-29 DIAGNOSIS — Z5111 Encounter for antineoplastic chemotherapy: Secondary | ICD-10-CM | POA: Diagnosis not present

## 2022-06-29 DIAGNOSIS — Z803 Family history of malignant neoplasm of breast: Secondary | ICD-10-CM | POA: Diagnosis not present

## 2022-06-29 DIAGNOSIS — Z5189 Encounter for other specified aftercare: Secondary | ICD-10-CM | POA: Diagnosis not present

## 2022-06-29 LAB — CMP (CANCER CENTER ONLY)
ALT: 38 U/L (ref 0–44)
AST: 24 U/L (ref 15–41)
Albumin: 4.3 g/dL (ref 3.5–5.0)
Alkaline Phosphatase: 116 U/L (ref 38–126)
Anion gap: 11 (ref 5–15)
BUN: 16 mg/dL (ref 8–23)
CO2: 24 mmol/L (ref 22–32)
Calcium: 9.6 mg/dL (ref 8.9–10.3)
Chloride: 104 mmol/L (ref 98–111)
Creatinine: 1.06 mg/dL — ABNORMAL HIGH (ref 0.44–1.00)
GFR, Estimated: 55 mL/min — ABNORMAL LOW (ref >60)
Glucose, Bld: 100 mg/dL — ABNORMAL HIGH (ref 70–99)
Potassium: 4.1 mmol/L (ref 3.5–5.1)
Sodium: 139 mmol/L (ref 135–145)
Total Bilirubin: 0.5 mg/dL (ref 0.3–1.2)
Total Protein: 6.5 g/dL (ref 6.5–8.1)

## 2022-06-29 LAB — CBC WITH DIFFERENTIAL (CANCER CENTER ONLY)
Abs Immature Granulocytes: 0.03 10*3/uL (ref 0.00–0.07)
Basophils Absolute: 0 10*3/uL (ref 0.0–0.1)
Basophils Relative: 0 %
Eosinophils Absolute: 0 10*3/uL (ref 0.0–0.5)
Eosinophils Relative: 0 %
HCT: 32.4 % — ABNORMAL LOW (ref 36.0–46.0)
Hemoglobin: 10.6 g/dL — ABNORMAL LOW (ref 12.0–15.0)
Immature Granulocytes: 0 %
Lymphocytes Relative: 16 %
Lymphs Abs: 1.3 10*3/uL (ref 0.7–4.0)
MCH: 32.6 pg (ref 26.0–34.0)
MCHC: 32.7 g/dL (ref 30.0–36.0)
MCV: 99.7 fL (ref 80.0–100.0)
Monocytes Absolute: 0.8 10*3/uL (ref 0.1–1.0)
Monocytes Relative: 10 %
Neutro Abs: 5.6 10*3/uL (ref 1.7–7.7)
Neutrophils Relative %: 74 %
Platelet Count: 175 10*3/uL (ref 150–400)
RBC: 3.25 MIL/uL — ABNORMAL LOW (ref 3.87–5.11)
RDW: 17.5 % — ABNORMAL HIGH (ref 11.5–15.5)
WBC Count: 7.7 10*3/uL (ref 4.0–10.5)
nRBC: 0 % (ref 0.0–0.2)

## 2022-06-29 LAB — MAGNESIUM: Magnesium: 1 mg/dL — ABNORMAL LOW (ref 1.7–2.4)

## 2022-06-29 LAB — LACTATE DEHYDROGENASE: LDH: 160 U/L (ref 98–192)

## 2022-06-29 MED ORDER — SODIUM CHLORIDE 0.9 % IV SOLN
50.0000 mg/m2 | Freq: Once | INTRAVENOUS | Status: AC
  Administered 2022-06-29: 80 mg via INTRAVENOUS
  Filled 2022-06-29: qty 8

## 2022-06-29 MED ORDER — DIPHENHYDRAMINE HCL 50 MG/ML IJ SOLN
25.0000 mg | Freq: Once | INTRAMUSCULAR | Status: AC
  Administered 2022-06-29: 25 mg via INTRAVENOUS
  Filled 2022-06-29: qty 1

## 2022-06-29 MED ORDER — SODIUM CHLORIDE 0.9 % IV SOLN
150.0000 mg | Freq: Once | INTRAVENOUS | Status: AC
  Administered 2022-06-29: 150 mg via INTRAVENOUS
  Filled 2022-06-29: qty 150

## 2022-06-29 MED ORDER — SODIUM CHLORIDE 0.9% FLUSH
10.0000 mL | Freq: Once | INTRAVENOUS | Status: AC
  Administered 2022-06-29: 10 mL

## 2022-06-29 MED ORDER — SODIUM CHLORIDE 0.9 % IV SOLN: Freq: Once | INTRAVENOUS | Status: AC

## 2022-06-29 MED ORDER — SODIUM CHLORIDE 0.9 % IV SOLN
10.0000 mg | Freq: Once | INTRAVENOUS | Status: AC
  Administered 2022-06-29: 10 mg via INTRAVENOUS
  Filled 2022-06-29: qty 10

## 2022-06-29 MED ORDER — PALONOSETRON HCL INJECTION 0.25 MG/5ML
0.2500 mg | Freq: Once | INTRAVENOUS | Status: AC
  Administered 2022-06-29: 0.25 mg via INTRAVENOUS
  Filled 2022-06-29: qty 5

## 2022-06-29 MED ORDER — SODIUM CHLORIDE 0.9 % IV SOLN
2.0000 g | Freq: Once | INTRAVENOUS | Status: DC
  Filled 2022-06-29: qty 4

## 2022-06-29 MED ORDER — MAGNESIUM SULFATE 2 GM/50ML IV SOLN
2.0000 g | Freq: Once | INTRAVENOUS | Status: AC
  Administered 2022-06-29: 2 g via INTRAVENOUS
  Filled 2022-06-29: qty 50

## 2022-06-29 MED ORDER — SODIUM CHLORIDE 0.9 % IV SOLN
316.5000 mg | Freq: Once | INTRAVENOUS | Status: AC
  Administered 2022-06-29: 320 mg via INTRAVENOUS
  Filled 2022-06-29: qty 32

## 2022-06-29 MED ORDER — FAMOTIDINE IN NACL 20-0.9 MG/50ML-% IV SOLN
20.0000 mg | Freq: Once | INTRAVENOUS | Status: AC
  Administered 2022-06-29: 20 mg via INTRAVENOUS
  Filled 2022-06-29: qty 50

## 2022-06-29 NOTE — Patient Instructions (Signed)
[facility]  Discharge Instructions: Thank you for choosing [facility] to provide your oncology and hematology care.   If you have a lab appointment with the Cancer Center, please go directly to the Cancer Center and check in at the registration area.  Wear comfortable clothing and clothing appropriate for easy access to any Portacath or PICC line.   We strive to give you quality time with your provider. You may need to reschedule your appointment if you arrive late (15 or more minutes).  Arriving late affects you and other patients whose appointments are after yours.  Also, if you miss three or more appointments without notifying the office, you may be dismissed from the clinic at the provider's discretion.      For prescription refill requests, have your pharmacy contact our office and allow 72 hours for refills to be completed.    Today you received the following chemotherapy and/or immunotherapy agents Taxotere and Carboplatin.   To help prevent nausea and vomiting after your treatment, we encourage you to take your nausea medication as directed.  BELOW ARE SYMPTOMS THAT SHOULD BE REPORTED IMMEDIATELY: *FEVER GREATER THAN 100.4 F (38 C) OR HIGHER *CHILLS OR SWEATING *NAUSEA AND VOMITING THAT IS NOT CONTROLLED WITH YOUR NAUSEA MEDICATION *UNUSUAL SHORTNESS OF BREATH *UNUSUAL BRUISING OR BLEEDING *URINARY PROBLEMS (pain or burning when urinating, or frequent urination) *BOWEL PROBLEMS (unusual diarrhea, constipation, pain near the anus) TENDERNESS IN MOUTH AND THROAT WITH OR WITHOUT PRESENCE OF ULCERS (sore throat, sores in mouth, or a toothache) UNUSUAL RASH, SWELLING OR PAIN  UNUSUAL VAGINAL DISCHARGE OR ITCHING   Items with * indicate a potential emergency and should be followed up as soon as possible or go to the Emergency Department if any problems should occur.  Please show the CHEMOTHERAPY ALERT CARD or IMMUNOTHERAPY ALERT  CARD at check-in to the Emergency Department and triage nurse. Should you have questions after your visit or need to cancel or reschedule your appointment, please contact [facility]  [phone] and follow the prompts.  Office hours are 8:00 a.m. to 4:30 p.m. Monday - Friday. Please note that voicemails left after 4:00 p.m. may not be returned until the following business day.  We are closed weekends and major holidays. You have access to a nurse at all times for urgent questions. Please call the main number to the clinic [phone] and follow the prompts.  For any non-urgent questions, you may also contact your provider using MyChart. We now offer e-Visits for anyone 18 and older to request care online for non-urgent symptoms. For details visit [email or web address].   Also download the MyChart app! Go to the app store, search "MyChart", open the app, select [facility], and log in with your MyChart username and password.   

## 2022-06-29 NOTE — Patient Instructions (Signed)

## 2022-06-29 NOTE — Progress Notes (Signed)
Hematology and Oncology Follow Up Visit  Maria Romero [id number] September 16, 1950 72 y.o. 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  She is doing quite well with her adjuvant chemotherapy with the Taxotere/carboplatinum.  Her CA-125 is now down to 20.  Hopefully, she will be able to have her colostomy reversal.  It sounds like this probably will be done in either late April or early May.  We are holding the Avastin so that surgery will be safe.  She looks great.  She feels good.  I am just happy that she is tolerating treatment very well.  She does get some mouth sores.  She uses a mouth rinse to try to help with this.  She has had no issues with nausea or vomiting.  She has had no diarrhea.  She has a colostomy which is working well..  She has had no leg swelling.  There is been no rashes.  She has had no bleeding.  Overall, I would say that her performance status is probably ECOG 1.       Wt Readings from Last 3 Encounters:  06/29/22 111 lb (50.3 kg)  06/10/22 115 lb (52.2 kg)  06/08/22 113 lb (51.3 kg)    Medications:  Current Outpatient Medications:    atorvastatin (LIPITOR) 40 MG tablet, TAKE ONE TABLET BY MOUTH EVERY NIGHT AT BEDTIME, Disp: 90 tablet, Rfl: 3   Calcium-Vitamin D-Vitamin K (VIACTIV PO), Take 1 tablet by mouth in the morning and at bedtime., Disp: , Rfl:    cholecalciferol (VITAMIN D3) 25 MCG (1000 UNIT) tablet, Take 1,000 Units by mouth daily., Disp: , Rfl:    estradiol (ESTRACE VAGINAL) 0.1 MG/GM vaginal cream, Use vaginal estrogen cream nightly at bedtime for 2 weeks then three times a week after.  You  will need to use a fingertip size amount of cream on your finger and place slightly past the vaginal entrance at bedtime. Do not use the applicator if this comes with the cream., Disp: 42.5 g, Rfl: 1   lidocaine-prilocaine (EMLA) cream, Apply 1 Application topically as needed (for port access)., Disp: , Rfl:    loperamide (IMODIUM A-D) 2 MG tablet, Take 1 tablet (2 mg total) by mouth 2 (two) times daily. Take 1 extra during the day if output high, Disp: 30 tablet, Rfl: 0   magic mouthwash (nystatin, lidocaine, diphenhydrAMINE, alum & mag hydroxide) suspension, Swish and spit 5 mLs 4 (four) times daily as needed for mouth pain., Disp: 240 mL, Rfl: 1   magnesium oxide (MAG-OX) 400 (240 Mg) MG tablet, Take 1 tablet (400 mg total) by mouth daily., Disp: 30 tablet, Rfl: 3   omeprazole (PRILOSEC) 20 MG capsule, Take 1 capsule (20 mg total) by mouth daily. (Patient taking differently: Take 20 mg by mouth daily before breakfast.), Disp: 90 capsule, Rfl: 2   ondansetron (ZOFRAN) 8 MG tablet, Take 1 tablet (8 mg total) by mouth every 8 (eight) hours as needed for nausea or vomiting. Start on the third day after chemotherapy., Disp: 30 tablet, Rfl: 1   prochlorperazine (COMPAZINE) 10 MG tablet, Take 1 tablet (10 mg total) by mouth every 6 (six) hours as  needed for nausea or vomiting., Disp: 30 tablet, Rfl: 1   rivaroxaban (XARELTO) 10 MG TABS tablet, Take 1 tablet (10 mg total) by mouth daily., Disp: 28 tablet, Rfl: 0   TYLENOL 500 MG tablet, Take 500-1,000 mg by mouth every 6 (six) hours as needed for mild pain or headache., Disp: , Rfl:   Allergies:  Allergies  Allergen Reactions   Fluvastatin Sodium Other (See Comments)    Myalgias    Past Medical History, Surgical history, Social history, and Family History were reviewed and updated.  Review of Systems: Review of Systems  Constitutional: Negative.   HENT:  Negative.    Eyes: Negative.   Respiratory: Negative.    Cardiovascular: Negative.    Gastrointestinal:  Negative for abdominal pain.  Genitourinary:  Negative for pelvic pain.   Musculoskeletal: Negative.   Skin: Negative.   Neurological: Negative.   Hematological: Negative.   Psychiatric/Behavioral: Negative.      Physical Exam:  height is 5\' 3"  (1.6 m) and weight is 111 lb (50.3 kg). Her oral temperature is 97.6 F (36.4 C). Her blood pressure is 120/68 and her pulse is 76. Her respiration is 18 and oxygen saturation is 100%.   Wt Readings from Last 3 Encounters:  06/29/22 111 lb (50.3 kg)  06/10/22 115 lb (52.2 kg)  06/08/22 113 lb (51.3 kg)    Physical Exam Vitals reviewed.  HENT:     Head: Normocephalic and atraumatic.  Eyes:     Pupils: Pupils are equal, round, and reactive to light.  Cardiovascular:     Rate and Rhythm: Normal rate and regular rhythm.     Heart sounds: Normal heart sounds.  Pulmonary:     Effort: Pulmonary effort is normal.     Breath sounds: Normal breath sounds.  Chest:    Abdominal:     General: Bowel sounds are normal.     Palpations: Abdomen is soft.     Comments: Abdominal exam shows a colostomy in the right lower quadrant.  There is a well-healed laparotomy scar.  She has no fluid wave.  There is no palpable abdominal mass.  There is no palpable liver or spleen tip.  Is scantly distended  Musculoskeletal:        General: No tenderness or deformity. Normal range of motion.     Cervical back: Normal range of motion.  Lymphadenopathy:     Cervical: No cervical adenopathy.  Skin:    General: Skin is warm and dry.     Findings: No erythema or rash.  Neurological:     Mental Status: She is alert and oriented to person, place, and time.  Psychiatric:        Behavior: Behavior normal.        Thought Content: Thought content normal.        Judgment: Judgment normal.      Lab Results  Component Value Date   WBC 7.7 06/29/2022   HGB 10.6 (L) 06/29/2022   HCT 32.4 (L) 06/29/2022   MCV 99.7 06/29/2022   PLT 175  06/29/2022     Chemistry      Component Value Date/Time   NA 139 06/29/2022 1010   K 4.1 06/29/2022 1010   CL 104 06/29/2022 1010   CO2 24 06/29/2022 1010   BUN 16 06/29/2022 1010   CREATININE 1.06 (H) 06/29/2022 1010   CREATININE 0.93 11/03/2021 0000      Component Value Date/Time   CALCIUM 9.6 06/29/2022 1010   ALKPHOS  116 06/29/2022 1010   AST 24 06/29/2022 1010   ALT 38 06/29/2022 1010   BILITOT 0.5 06/29/2022 1010        Impression and Plan: Maria Romero is a very charming 72 year old white female.  She had neoadjuvant chemotherapy.  She really had a tough time with neoadjuvant chemotherapy.  She subsequently underwent surgical resection.  She did still have active disease.    She is on adjuvant chemotherapy.  She has had 2 cycle today.  We will go and give her third and final cycle.  After surgery, we will then get her on a PARP inhibitor.  I think this would be a good idea for her.  I think this would be very helpful.  I know that she will have incredible care by Dr. Berline Lopes.  Will set the CT scan up for her in mid April.  I think that it would be safe for surgery come late April early May.    Volanda Napoleon, MD 3/20/202411:00 AM

## 2022-06-29 NOTE — Progress Notes (Signed)
Patient receiving her final chemo cycle before having surgery in April/May. She will need a CT mid April, which has been ordered. After surgery plan is to begin maintenance with PARP inhibitor.   Oncology Nurse Navigator Documentation     06/29/2022   11:30 AM  Oncology Nurse Navigator Flowsheets  Navigator Follow Up Date: 08/01/2022  Navigator Follow Up Reason: Scan Review  Navigator Location [facility]  Navigator Encounter Type Treatment;Appt/Treatment Plan Review  Patient Visit Type MedOnc  Treatment Phase Final Chemo TX  Barriers/Navigation Needs Coordination of Care  Interventions Psycho-Social Support  Acuity Level 2-Minimal Needs (1-2 Barriers Identified)  Support Groups/Services Friends and Family  Time Spent with Patient 15

## 2022-06-30 ENCOUNTER — Other Ambulatory Visit: Payer: Self-pay | Admitting: Hematology & Oncology

## 2022-06-30 ENCOUNTER — Inpatient Hospital Stay: Payer: Medicare Other

## 2022-06-30 VITALS — BP 117/70 | HR 73 | Temp 97.7°F | Resp 17

## 2022-06-30 DIAGNOSIS — Z5189 Encounter for other specified aftercare: Secondary | ICD-10-CM | POA: Diagnosis not present

## 2022-06-30 DIAGNOSIS — Z5111 Encounter for antineoplastic chemotherapy: Secondary | ICD-10-CM | POA: Diagnosis not present

## 2022-06-30 DIAGNOSIS — I10 Essential (primary) hypertension: Secondary | ICD-10-CM | POA: Diagnosis not present

## 2022-06-30 DIAGNOSIS — Z803 Family history of malignant neoplasm of breast: Secondary | ICD-10-CM | POA: Diagnosis not present

## 2022-06-30 DIAGNOSIS — C561 Malignant neoplasm of right ovary: Secondary | ICD-10-CM

## 2022-06-30 DIAGNOSIS — D702 Other drug-induced agranulocytosis: Secondary | ICD-10-CM

## 2022-06-30 DIAGNOSIS — Z87891 Personal history of nicotine dependence: Secondary | ICD-10-CM | POA: Diagnosis not present

## 2022-06-30 LAB — CA 125: Cancer Antigen (CA) 125: 15.3 U/mL (ref 0.0–38.1)

## 2022-06-30 MED ORDER — PEGFILGRASTIM INJECTION 6 MG/0.6ML ~~LOC~~
6.0000 mg | PREFILLED_SYRINGE | Freq: Once | SUBCUTANEOUS | Status: AC
  Administered 2022-06-30: 6 mg via SUBCUTANEOUS
  Filled 2022-06-30: qty 0.6

## 2022-06-30 NOTE — Patient Instructions (Signed)
PEG Tube Home Guide A PEG tube is used to put food, medicine, and fluids into the stomach. Before you leave the hospital, make sure that you know: How to care for your PEG tube. How to care for the opening (stoma) in your belly. How to give yourself a feeding. How to give yourself medicines. When to call your doctor for help. Supplies needed: Soapy water. Clean, plain water. Clean washcloth. Bandage (dressing). This is optional. Syringe. How to care for a PEG tube Check your PEG tube every day. Make sure: It is not too tight. It is in the correct place. There is a mark on the tube that shows when the tube is in the correct place. Adjust the tube if you need to. Cleaning your stoma Clean your stoma every day. Follow these steps: Wash your hands with soap and water for at least 20 seconds. If you cannot use soap and water, use hand sanitizer. Check your stoma. Let your doctor know if there is: Redness. Swelling. Leaking. Skin irritation. Wash the stoma gently with warm, soapy water. Rinse the stoma with plain water. Pat the stoma area dry. Place a bandage over the stoma if your doctor told you to do that.  Giving yourself a feeding Your doctor will tell you: How much nutrition and fluid you will need for each feeding. How often to have a feeding. Whether you should take medicine in the tube by itself or with a feeding. To give yourself a feeding, follow these steps: Lay out all of the things that you will need. Make sure that the nutritional formula is at room temperature. Wash your hands with soap and water for at least 20 seconds. If you cannot use soap and water, use hand sanitizer. Sit up or stand up straight. You will need to stay sitting up or standing up while you give yourself a feeding. Make sure the syringe plunger is pushed in. Place the tip of the syringe in clean water, and slowly pull the plunger to bring (draw up) the water into the syringe. Remove the clamp and  the cap from the PEG tube. Push the water out of the syringe to clean (flush) the tube. If the tube is clear, draw up the formula into the syringe. Make sure to use the right amount for each feeding. Add water if you need to. Slowly push the formula from the syringe through the tube. After the feeding, flush the tube with water. Put the clamp and the cap on the tube. Stay sitting up or standing up straight for at least 30 minutes. Use the feeding tube equipment, such as syringes and connectors, as told by your doctor. Giving yourself medicine To give yourself medicine, follow these steps: Lay out all of the things that you will need. If your medicine is in tablet form, crush it and dissolve it in water. Wash your hands with soap and water for at least 20 seconds. If you cannot use soap and water, use hand sanitizer. Sit up or stand up straight. You will need to stay sitting up or standing up while you give yourself medicine. Make sure the syringe plunger is pushed in. Place the tip of the syringe in clean water, and slowly pull the plunger to bring (draw up) the water into the syringe. Remove the clamp and the cap from the PEG tube. Push the water out of the syringe to clean (flush) the tube. If the tube is clear, draw up the medicine into the syringe.   Slowly push the medicine from the syringe through the tube. Flush the tube with water. Put the clamp and the cap on the tube. Stay sitting up or standing up straight for at least 30 minutes. Do not take sustained release (SR) medicines through your tube. If you are not sure if your medicine is an SR medicine, ask your doctor. Contact a doctor if: The area around your stoma is sore, irritated, or red. You have belly pain or bloating while you are feeding or after you feed. You feel like you may vomit (nauseous) and the feeling will not go away. You have trouble pooping (constipation) or you have watery poop (diarrhea) for a long time. You  have a fever. You have problems with your PEG tube. Get help right away if: Your tube is blocked. Your tube falls out. You have pain around your stoma. You are bleeding from your stoma. Your tube is leaking. You choke or you have trouble breathing while you are feeding or after you feed. Summary A PEG tube is used to put food and fluids into the stomach. Before you leave the hospital, you will be taught how to use and care for your PEG tube. Your doctor will give you instructions on how to give yourself feedings and medicines. Contact your doctor if you have a fever or soreness, redness, or irritation around your stoma. Get help right away if your tube leaks, is blocked, or falls out. Also, get help right away if you have pain or bleeding around your stoma. This information is not intended to replace advice given to you by your health care provider. Make sure you discuss any questions you have with your health care provider. Document Revised: 03/18/2020 Document Reviewed: 08/09/2019 [employer] Patient Education  2023 [employer]   

## 2022-07-08 ENCOUNTER — Inpatient Hospital Stay (HOSPITAL_BASED_OUTPATIENT_CLINIC_OR_DEPARTMENT_OTHER): Payer: Medicare Other | Admitting: Gynecologic Oncology

## 2022-07-08 VITALS — BP 108/67 | HR 96 | Temp 98.8°F | Resp 16 | Ht 63.0 in | Wt 108.0 lb

## 2022-07-08 DIAGNOSIS — Z87891 Personal history of nicotine dependence: Secondary | ICD-10-CM | POA: Diagnosis not present

## 2022-07-08 DIAGNOSIS — Z90722 Acquired absence of ovaries, bilateral: Secondary | ICD-10-CM

## 2022-07-08 DIAGNOSIS — Z5189 Encounter for other specified aftercare: Secondary | ICD-10-CM | POA: Diagnosis not present

## 2022-07-08 DIAGNOSIS — Z803 Family history of malignant neoplasm of breast: Secondary | ICD-10-CM | POA: Diagnosis not present

## 2022-07-08 DIAGNOSIS — Z90710 Acquired absence of both cervix and uterus: Secondary | ICD-10-CM

## 2022-07-08 DIAGNOSIS — I10 Essential (primary) hypertension: Secondary | ICD-10-CM | POA: Diagnosis not present

## 2022-07-08 DIAGNOSIS — C561 Malignant neoplasm of right ovary: Secondary | ICD-10-CM

## 2022-07-08 DIAGNOSIS — C569 Malignant neoplasm of unspecified ovary: Secondary | ICD-10-CM

## 2022-07-08 DIAGNOSIS — Z5111 Encounter for antineoplastic chemotherapy: Secondary | ICD-10-CM | POA: Diagnosis not present

## 2022-07-08 NOTE — Progress Notes (Signed)
Patient presents for continued follow-up.  She reports recently having chemo states she is experiencing symptoms after this.

## 2022-07-08 NOTE — Patient Instructions (Signed)
Plan to continue with the vaginal estrogen at least three times a week at bedtime.   We will plan to reassess when you come in May. Plan to have the CT scan before this appointment with rectal contrast.  Please call for any new symptoms including increase in vaginal discharge, increase in spotting or bleeding, new pressure, change in bladder/bowel habits.

## 2022-07-08 NOTE — Progress Notes (Signed)
Gynecologic Oncology Follow Up Visit  07/08/2022  Reason for Visit: Follow up from previous visit, vaginal cuff examination  Treatment History: Oncology History  [name], right ([place])  11/15/2021 Imaging   CT A/P: 1. Large volume ascites with peritoneal thickening and omental caking in the left abdomen. Imaging features are consistent with metastatic disease. 2. Contiguous with the posterior uterine fundus and extending into the right adnexal space is a poorly marginated heterogeneous 9.6 x 8.8 x 9.4 cm mass. Imaging features are consistent with neoplasm likely of right ovarian etiology. Associated heterogeneously enhancing mass involving the posterior lower uterine segment and cervix extending posteriorly towards the cul-de-sac. This may be a drop metastasis. 3. Soft tissue fullness also noted left adnexal region without normal left ovary by CT. 4. Subtle irregularity of liver contour raising the question of but not definitive for cirrhosis. 5. Small left and tiny right pleural effusions. 6. Aortic Atherosclerosis (ICD10-I70.0).   11/25/2021 Imaging   CT chest 1. Bilateral pleural effusions greatest on the LEFT both small volume but with fissural nodularity along the major fissure in the LEFT chest. Early involvement in the chest is not excluded. Sampling of pleural fluid may be helpful. 2. No adenopathy by size criteria. Tiny pulmonary nodule in the LEFT lower lobe as described. 3. Three-vessel coronary artery disease greatest in LEFT coronary circulation. 4. Reduction in malignant ascites since previous imaging following paracentesis.   12/01/2021 Initial Diagnosis   [name], right ([place])   12/01/2021 Cancer Staging   Staging form: Ovary, Fallopian Tube, and Primary Peritoneal Carcinoma, AJCC 8th Edition - Clinical stage from 12/01/2021: FIGO Stage IIIC (cT3c, cN1b, cM0) - Signed by Volanda Napoleon, MD on 12/01/2021 Stage prefix: Initial diagnosis Histologic grade  (G): G2 Histologic grading system: 4 grade system   12/07/2021 - 06/29/2022 Chemotherapy   Patient is on Treatment Plan : OVARIAN Carboplatin (AUC 6) + Paclitaxel (175) q21d X 6 Cycles     01/11/2022 - 06/08/2022 Chemotherapy   Patient is on Treatment Plan : OVARIAN Bevacizumab q21d      Genetic Testing   Negative genetic testing. No pathogenic variants identified on the [facility] Common Hereditary Cancers+RNA panel. The report date is 01/24/2022.  The Common Hereditary Cancers Panel + RNA offered by [facility] includes sequencing and/or deletion duplication testing of the following 47 genes: APC, ATM, AXIN2, BARD1, BMPR1A, BRCA1, BRCA2, BRIP1, CDH1, CDKN2A (p14ARF), CDKN2A (p16INK4a), CKD4, CHEK2, CTNNA1, DICER1, EPCAM (Deletion/duplication testing only), GREM1 (promoter region deletion/duplication testing only), KIT, MEN1, MLH1, MSH2, MSH3, MSH6, MUTYH, NBN, NF1, NHTL1, PALB2, PDGFRA, PMS2, POLD1, POLE, PTEN, RAD50, RAD51C, RAD51D, SDHB, SDHC, SDHD, SMAD4, SMARCA4. STK11, TP53, TSC1, TSC2, and VHL.  The following genes were evaluated for sequence changes only: SDHA and HOXB13 c.251G>A variant only.   03/08/2022 Imaging   CT C/A/P: 1. Reduction in the omental caking of tumor compared to 11/15/2021, although substantial tumor remains. 2. The right adnexal mass has a volume of 220 cubic cm and appears to be primarily solid, although components may be hemorrhagic. Previous ascites has convinced into a loculated collection of fluid surrounding this adnexal mass and measuring 1400 cc. 3. Questionable bowel wall thickening in the transverse colon, although much of the appearance may be due to nondistention. Correlate with any signs of colitis. 4. Previous right pleural effusion has resolved. Residual small left pleural effusion is nonspecific for transudative or exudative etiology. 5. Other imaging findings of potential clinical significance: Substantial left anterior descending coronary  artery atherosclerosis. Grade 1 degenerative  anterolisthesis L4-5. Degenerative facet arthropathy at L4-5 and L5-S1. Old healed right posterior rib fractures. Small calcifications along the pleural margin of the left hemidiaphragm, possibly from prior asbestos exposure or prior pleurodesis. 6. Aortic atherosclerosis.   04/20/2022 Surgery   Diagnostic laparoscopy, exploratory laparotomy with lysis of adhesions for approximately 45 minutes, omentectomy including mobilization of the splenic flexure, radical tumor debulking with en bloc resection of bilateral adnexa, uterus, cervix and rectosigmoid colon, end-to-end colonic reanastomosis, diverting loop ileostomy, cystoscopy   Findings: On EUA, fixed pelvic mass with nodularity appreciated within the posterior cul-de-sac.  On intra-abdominal entry the laparoscope, upper abdominal adhesions limiting assessment of the left upper quadrant.  Large, cystic mass fills the mid abdomen.  On laparotomy, approximately 22 cm cystic mass spans from the deep pelvis to the upper abdomen with a rind.  This lesion appears to be walling off the intra-abdominal ascites.  Multiple loops of small bowel are adherent to the cystic lesion.  There is significant filmy adhesive disease between loops of small bowel, the bowel and cystic lesion, and the small bowel and anterior abdominal wall.  Also numerous less than 1 cm white nodules noted along the small bowel and mesentery (frequently within filmy adhesions), most consistent with treated tumor.  During lysis of adhesions, 2 areas of serosal injury noted along the ileum, oversewn.  Approximately 4-6 cm tumor implant within the greater omentum and omental nodularity extending along the omentum to the splenic flexure.  Lesser sac without disease.  Stomach normal in appearance.,  Adhesions between the liver and the anterior abdominal wall bilaterally, no nodularity appreciated along either the liver or diaphragm.  Appendix itself  normal-appearing, appendiceal mesentery minimally adherent to the right adnexa.  Normal cecum, ascending colon, transverse colon, and descending colon.  Large cystic mass noted to be adherent to the bladder peritoneum and uterus anteriorly within the pelvis.  Left adnexa is somewhat distorted.  Right ovary replaced by an 8 cm cystic mass.  Normal-appearing distal right fallopian fimbria.  Area of the sigmoid colon adherent posterior to the right adnexa, requiring an en bloc resection.  Somewhat woody texture palpated along the rectovaginal septum after reverse hysterectomy performed.  Negative bubble test after rectosigmoid anastomosis.  In the setting of planned additional treatment and recent receipt of bevacizumab, decision made to perform diverting loop ileostomy. Multiple adhesions and nodular implants sent.  If these are negative for malignancy or show treated tumor, then this was an R0 resection.  Otherwise, R1 resection. On cystoscopy, bladder dome intact. Good efflux seen from bilateral ureteral orifices.    04/20/2022 Pathology Results   A. SMALL BOWEL ADHESION, EXCISION: -Fibrous tissue with chronic inflammation, consistent with adhesions  B. SMALL BOWEL NODULE, EXCISION: - Benign fibrotic nodule - Negative for carcinoma  C. UTERUS, CERVIX, BILATERAL TUBES AND OVARIES, RECTOSIGMOID COLON, RESECTION: - Cervix: Benign, nabothian cyst - Endometrium: Benign inactive endometrium - Myometrium: No significant pathologic changes - Ovary: High-grade serous carcinoma - Colon: Metastatic serous carcinoma involving colonic serosa - See oncology table  D. OMENTUM, EXCISION: - Involved by high-grade serous carcinoma  E. RECTAL DONUTS, EXCISION: - Benign colonic mucosa with no significant pathologic changes   ONCOLOGY TABLE:  OVARY or FALLOPIAN TUBE or PRIMARY PERITONEUM: Resection  Procedure: Total hysterectomy, rectosigmoid colon resection, omental excision, adhesiolysis Specimen  Integrity: Disrupted Tumor Site: Ovary Tumor Size: 10.5 x 7.5 x 5 cm Histologic Type: serous carcinoma Histologic Grade: High-grade Ovarian Surface Involvement: See comment Fallopian Tube Surface Involvement: Not identified Implants (  required for advanced stage serous/seromucinous borderline tumors only): Invasive implants Other Tissue/ Organ Involvement: Colonic serosa Largest Extrapelvic Peritoneal Focus: 8 cm (omental implant) Peritoneal/Ascitic Fluid Involvement: Present Chemotherapy Response Score (CRS): CRS2 Regional Lymph Nodes: Not applicable       Distant Metastasis:      Distant Site(s) Involved: None Pathologic Stage Classification (pTNM, AJCC 8th Edition): pT3c, pN[not assigned] Ancillary Studies: Can be performed upon request Representative Tumor Block: C11 Comment(s): Assessment of ovarian surface involvement is limited by disrupted and necrotic nature of the specimen. The tumor is located in the right ovary per the surgeon, an additional ovary and fallopian tube are not identified. An additional section is submitted to identify possible fallopian tube.    Received cycle 8 of chemotherapy including carboplatin, taxotere on 06/29/22 under the care of Dr. Marin Olp. Last avastin was with cycle 7.  Interval History: Patient presents to the office today for follow up and assessment of the vaginal cuff. She reports fatigue, decreased po intake related to mouth sores, voice changes related to mouth sores, ringing in her ears, mild shortness of breath on exertion related to recent chemo. She reports typically having these symptoms several days after chemo then she will start to feel better. She does not feel that magic mouthwash works for her mouth sores and states the sores are also in her nasal passages and throat. She has had decreased appetite and intake related to the mouth sores.   She has had slower output from the ostomy due to decreased appetite. She continues to manage  the output with imodium based on the consistency. She reports having vaginal spotting on the day she received her last chemo cycle. She will has a small amount of vaginal discharge but this is better compared with last visit. She is emptying her bladder without difficulty. No output or drainage from the rectum. She has occasional aches and pains but no significant pain reported. She is due for her mammogram in June. No lower extremity pain or edema reported. Daughter present today.      Past Medical/Surgical History: Past Medical History:  Diagnosis Date   Anemia    Complication of anesthesia    Dyspnea    Goals of care, counseling/discussion 12/01/2021   Hypercholesterolemia    Hypertension    IBS (irritable bowel syndrome)    Malignant ascites 11/19/2021   Osteopenia    Osteoporosis    [name], right ([place]) 12/01/2021   Ovarian mass, right 11/19/2021   PONV (postoperative nausea and vomiting)    Toe fracture    lt small toe    Past Surgical History:  Procedure Laterality Date   BOWEL RESECTION N/A 04/20/2022   Procedure: BOWEL RESECTION; DIVERTING ILEOSTOMY;  Surgeon: [name], MD;  Location: [facility];  Service: Gynecology;  Laterality: N/A;   DEBULKING N/A 04/20/2022   Procedure: TUMOR DEBULKING, OMENTECTOMY;  Surgeon: [name], MD;  Location: [facility];  Service: Gynecology;  Laterality: N/A;   HYSTERECTOMY ABDOMINAL WITH SALPINGO-OOPHORECTOMY Bilateral 04/20/2022   Procedure: HYSTERECTOMY ABDOMINAL WITH BILATERAL SALPINGO-OOPHORECTOMY;  Surgeon: [name], MD;  Location: [facility];  Service: Gynecology;  Laterality: Bilateral;   IR IMAGING GUIDED PORT INSERTION  11/26/2021   IR PARACENTESIS  11/23/2021   IR PARACENTESIS  11/26/2021   LAPAROSCOPY N/A 04/20/2022   Procedure: LAPAROSCOPY DIAGNOSTIC,CYSTO;  Surgeon: [name], MD;  Location: [facility];  Service: Gynecology;  Laterality: N/A;   lump removal  04/11/1993   axilla right - benign  TONSILLECTOMY     TUBAL LIGATION  04/11/1988    Family History  Problem Relation Age of Onset   Stroke Mother    Hypertension Mother    Hyperlipidemia Mother    Glaucoma Mother    Heart attack Father 46   Diabetes Father    Hypertension Father    Hyperlipidemia Father    Cataracts Father    Breast cancer Cousin    Colon cancer Neg Hx    Ovarian cancer Neg Hx    Endometrial cancer Neg Hx    Pancreatic cancer Neg Hx    Prostate cancer Neg Hx     Social History   Socioeconomic History   Marital status: Widowed    Spouse name: Not on file   Number of children: 1   Years of education: 14   Highest education level: Associate degree: occupational, Hotel manager, or vocational program  Occupational History   Occupation: retired Quarry manager  Tobacco Use   Smoking status: Former    Types: Cigarettes    Quit date: 03/20/1990    Years since quitting: 32.3   Smokeless tobacco: Never   Tobacco comments:    quit 1988  Vaping Use   Vaping Use: Never used  Substance and Sexual Activity   Alcohol use: Not Currently    Alcohol/week: 2.0 standard drinks of alcohol    Types: 2 Glasses of wine per week    Comment: Wine in the evenings   Drug use: No   Sexual activity: Not Currently    Partners: Male  Other Topics Concern   Not on file  Social History Narrative   Lives alone with her dog. Helps with her grandson pick up and volunteering at his school. She enjoys spending time with her family.   Social Determinants of Health   Financial Resource Strain: Low Risk  (03/26/2021)   Overall Financial Resource Strain (CARDIA)    Difficulty of Paying Living Expenses: Not hard at all  Food Insecurity: No Food Insecurity (05/13/2022)   Hunger Vital Sign    Worried About Running Out of Food in the Last Year: Never true    Ran Out of Food in the Last Year: Never true  Transportation Needs: No Transportation Needs (05/13/2022)   PRAPARE - Hydrologist (Medical): No     Lack of Transportation (Non-Medical): No  Physical Activity: Inactive (03/26/2021)   Exercise Vital Sign    Days of Exercise per Week: 0 days    Minutes of Exercise per Session: 0 min  Stress: No Stress Concern Present (03/26/2021)   [place]    Feeling of Stress : Not at all  Social Connections: Socially Isolated (03/26/2021)   Social Connection and Isolation Panel [NHANES]    Frequency of Communication with Friends and Family: More than three times a week    Frequency of Social Gatherings with Friends and Family: Three times a week    Attends Religious Services: Never    Active Member of Clubs or Organizations: No    Attends Archivist Meetings: Never    Marital Status: Widowed    Current Medications:  Current Outpatient Medications:    amLODipine (NORVASC) 5 MG tablet, Take 5 mg by mouth daily., Disp: , Rfl:    atorvastatin (LIPITOR) 40 MG tablet, TAKE ONE TABLET BY MOUTH EVERY NIGHT AT BEDTIME, Disp: 90 tablet, Rfl: 3   Biotin 10000 MCG TABS, Take by mouth., Disp: ,  Rfl:    Calcium-Vitamin D-Vitamin K (VIACTIV PO), Take 1 tablet by mouth in the morning and at bedtime., Disp: , Rfl:    cholecalciferol (VITAMIN D3) 25 MCG (1000 UNIT) tablet, Take 1,000 Units by mouth daily., Disp: , Rfl:    estradiol (ESTRACE VAGINAL) 0.1 MG/GM vaginal cream, Use vaginal estrogen cream nightly at bedtime for 2 weeks then three times a week after.  You will need to use a fingertip size amount of cream on your finger and place slightly past the vaginal entrance at bedtime. Do not use the applicator if this comes with the cream., Disp: 42.5 g, Rfl: 1   lidocaine-prilocaine (EMLA) cream, Apply 1 Application topically as needed (for port access)., Disp: , Rfl:    loperamide (IMODIUM A-D) 2 MG tablet, Take 1 tablet (2 mg total) by mouth 2 (two) times daily. Take 1 extra during the day if output high, Disp: 30 tablet, Rfl: 0   magic  mouthwash (nystatin, lidocaine, diphenhydrAMINE, alum & mag hydroxide) suspension, Swish and spit 5 mLs 4 (four) times daily as needed for mouth pain., Disp: 240 mL, Rfl: 1   magnesium oxide (MAG-OX) 400 (240 Mg) MG tablet, Take 1 tablet (400 mg total) by mouth daily., Disp: 30 tablet, Rfl: 3   omeprazole (PRILOSEC) 20 MG capsule, Take 1 capsule (20 mg total) by mouth daily. (Patient taking differently: Take 20 mg by mouth daily before breakfast.), Disp: 90 capsule, Rfl: 2   TYLENOL 500 MG tablet, Take 500-1,000 mg by mouth every 6 (six) hours as needed for mild pain or headache., Disp: , Rfl:   Review of Systems: See interval, additional ROS negative.  Physical Exam: BP 108/67 (BP Location: Left Arm, Patient Position: Sitting)   Pulse 96   Temp 98.8 F (37.1 C) (Oral)   Resp 16   Ht 5\' 3"  (1.6 m)   Wt 108 lb (49 kg)   SpO2 100%   BMI 19.13 kg/m  General: Alert, oriented, no acute distress. HEENT: Normocephalic, atraumatic, sclera anicteric. Chest: Clear to auscultation bilaterally.  No wheezes or rhonchi. Heart regular in rate and rhythm. Abdomen: soft, nontender. Well-healed incision.  Ostomy with bag in place with soft stool.  Ostomy itself is pink and viable. Extremities: Grossly normal range of motion.  Warm, well perfused.  No edema bilaterally. GU: Normal appearing external genitalia without erythema, excoriation, or lesions.  Speculum exam reveals cuff intact. 1 cm ulcerated/healing area noted on the left aspect of the cuff. No evidence of deeper separation or visualization of peritoneal cavity. Bimanual confirms cuff intact, no fluctuance or tenderness on exam.   Laboratory & Radiologic Studies: Component Ref Range & Units 1 d ago (05/16/22) 1 mo ago (03/30/22) 2 mo ago (03/18/22) 2 mo ago (02/24/22) 3 mo ago (02/01/22) 3 mo ago (01/21/22) 4 mo ago (01/11/22)  Cancer Antigen (CA) 125 0.0 - 38.1 U/mL 31.9 167.0 High  CM 280.0 High  CM 369.0 High  CM 550.0 High  CM 546.0 High   CM 760.0 High  CM      Latest Ref Rng & Units 06/29/2022   10:10 AM 06/08/2022    9:15 AM 05/16/2022   11:40 AM  CBC  WBC 4.0 - 10.5 K/uL 7.7  7.4  6.3   Hemoglobin 12.0 - 15.0 g/dL 10.6  11.0  10.6   Hematocrit 36.0 - 46.0 % 32.4  33.8  33.3   Platelets 150 - 400 K/uL 175  247  232  Latest Ref Rng & Units 06/29/2022   10:10 AM 06/08/2022    9:15 AM 05/20/2022   11:18 AM  BMP  Glucose 70 - 99 mg/dL 100  106  111   BUN 8 - 23 mg/dL 16  18  35   Creatinine 0.44 - 1.00 mg/dL 1.06  1.14  1.01   Sodium 135 - 145 mmol/L 139  139  137   Potassium 3.5 - 5.1 mmol/L 4.1  3.8  4.3   Chloride 98 - 111 mmol/L 104  105  107   CO2 22 - 32 mmol/L 24  23  20    Calcium 8.9 - 10.3 mg/dL 9.6  10.2  9.6    Assessment & Plan: Maria Romero is a 72 y.o. 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 Germline testing negative. HRD+. She received cycle 8 on 3/20 including carboplatin and taxotere. She is doing well overall but having symptoms post-chemo. She presents to the office today for evaluation of the vaginal cuff. Vaginal cuff is healing slowly.     She is advised to continue with use of vaginal estrogen at least three times a week to assist with continued healing. Reportable signs and symptoms reviewed.  She is advised to call for increase in drainage, spotting, development of new symptoms such as rectal/vaginal pressure, abdominal symptoms, fever etc.   Plan continues to include repeat imaging after she finishes adjuvant chemotherapy.  At the time of the CT scan, she should receive water-soluble rectal contrast to reevaluate her anastomosis.  If anastomosis has healed well, Dr. Berline Lopes will plan to take her for ostomy reversal.  CT scan scheduled today with follow up in the office in May with Dr. Berline Lopes. A pelvic examination will be performed at that time to re-evaluate the vaginal cuff.  20 minutes of total time was spent for this patient encounter, including preparation,  face-to-face counseling with the patient and coordination of care, and documentation of the encounter.  Joylene John NP Gynecologic Oncology

## 2022-07-12 ENCOUNTER — Encounter: Payer: Self-pay | Admitting: Hematology & Oncology

## 2022-07-15 ENCOUNTER — Telehealth: Payer: Self-pay

## 2022-07-15 NOTE — Telephone Encounter (Signed)
Fax received from [employer] detailed written order for ostomy supplies.  Form filled out and faxed back.

## 2022-07-16 DIAGNOSIS — Z933 Colostomy status: Secondary | ICD-10-CM | POA: Diagnosis not present

## 2022-07-28 ENCOUNTER — Other Ambulatory Visit: Payer: Self-pay | Admitting: Family Medicine

## 2022-08-01 ENCOUNTER — Ambulatory Visit (HOSPITAL_COMMUNITY)
Admission: RE | Admit: 2022-08-01 | Discharge: 2022-08-01 | Disposition: A | Discharge status: Home / Self Care | Payer: Medicare Other | Source: Ambulatory Visit | Attending: Hematology & Oncology | Admitting: Hematology & Oncology

## 2022-08-01 DIAGNOSIS — C561 Malignant neoplasm of right ovary: Secondary | ICD-10-CM | POA: Diagnosis present

## 2022-08-01 DIAGNOSIS — N281 Cyst of kidney, acquired: Secondary | ICD-10-CM | POA: Diagnosis not present

## 2022-08-01 MED ORDER — HEPARIN SOD (PORK) LOCK FLUSH 100 UNIT/ML IV SOLN
INTRAVENOUS | Status: AC
  Filled 2022-08-01: qty 5

## 2022-08-01 MED ORDER — HEPARIN SOD (PORK) LOCK FLUSH 100 UNIT/ML IV SOLN
500.0000 [IU] | Freq: Once | INTRAVENOUS | Status: AC
  Administered 2022-08-01: 500 [IU] via INTRAVENOUS

## 2022-08-01 MED ORDER — IOHEXOL 300 MG/ML  SOLN
80.0000 mL | Freq: Once | INTRAMUSCULAR | Status: AC | PRN
  Administered 2022-08-01: 80 mL via INTRAVENOUS

## 2022-08-03 ENCOUNTER — Encounter: Payer: Self-pay | Admitting: *Deleted

## 2022-08-03 ENCOUNTER — Telehealth: Payer: Self-pay | Admitting: *Deleted

## 2022-08-03 NOTE — Progress Notes (Signed)
Reviewed CT scan which shows treatment response. Notified Dr Myna Hidalgo and GYN Navigator of results.   Oncology Nurse Navigator Documentation     08/03/2022    1:30 PM  Oncology Nurse Navigator Flowsheets  Navigator Follow Up Date: 08/17/2022  Navigator Follow Up Reason: Follow-up Appointment  Navigator Location [facility]  Navigator Encounter Type Scan Review  Patient Visit Type MedOnc  Treatment Phase Active Tx  Barriers/Navigation Needs Coordination of Care  Interventions None Required  Acuity Level 2-Minimal Needs (1-2 Barriers Identified)  Support Groups/Services Friends and Family  Time Spent with Patient 15

## 2022-08-03 NOTE — Telephone Encounter (Signed)
As noted below by Dr. Myna Hidalgo, I left a message informing the patient that the CT scan does not show any obvious residual cancer. You have done a great job! Instructed her to call the office if she had any questions or concerns.

## 2022-08-03 NOTE — Telephone Encounter (Signed)
-----   Message from Josph Macho, MD sent at 08/03/2022  4:04 PM EDT ----- Please call and let her know that the CT scan does not show any obvious residual cancer.  You have done a great job.  Cindee Lame

## 2022-08-16 ENCOUNTER — Encounter: Payer: Self-pay | Admitting: Gynecologic Oncology

## 2022-08-17 ENCOUNTER — Inpatient Hospital Stay: Payer: Medicare Other | Attending: Hematology & Oncology

## 2022-08-17 ENCOUNTER — Inpatient Hospital Stay: Payer: Medicare Other

## 2022-08-17 ENCOUNTER — Inpatient Hospital Stay: Payer: Medicare Other | Admitting: Hematology & Oncology

## 2022-08-17 ENCOUNTER — Encounter: Payer: Self-pay | Admitting: Hematology & Oncology

## 2022-08-17 ENCOUNTER — Encounter: Payer: Self-pay | Admitting: *Deleted

## 2022-08-17 DIAGNOSIS — Z90722 Acquired absence of ovaries, bilateral: Secondary | ICD-10-CM | POA: Insufficient documentation

## 2022-08-17 DIAGNOSIS — C561 Malignant neoplasm of right ovary: Secondary | ICD-10-CM | POA: Diagnosis not present

## 2022-08-17 DIAGNOSIS — Z933 Colostomy status: Secondary | ICD-10-CM | POA: Diagnosis not present

## 2022-08-17 DIAGNOSIS — Z9221 Personal history of antineoplastic chemotherapy: Secondary | ICD-10-CM | POA: Insufficient documentation

## 2022-08-17 DIAGNOSIS — R748 Abnormal levels of other serum enzymes: Secondary | ICD-10-CM | POA: Diagnosis not present

## 2022-08-17 DIAGNOSIS — Z90710 Acquired absence of both cervix and uterus: Secondary | ICD-10-CM | POA: Insufficient documentation

## 2022-08-17 LAB — CBC WITH DIFFERENTIAL (CANCER CENTER ONLY)
Abs Immature Granulocytes: 0.01 10*3/uL (ref 0.00–0.07)
Basophils Absolute: 0 10*3/uL (ref 0.0–0.1)
Basophils Relative: 1 %
Eosinophils Absolute: 0.2 10*3/uL (ref 0.0–0.5)
Eosinophils Relative: 4 %
HCT: 32.9 % — ABNORMAL LOW (ref 36.0–46.0)
Hemoglobin: 10.8 g/dL — ABNORMAL LOW (ref 12.0–15.0)
Immature Granulocytes: 0 %
Lymphocytes Relative: 24 %
Lymphs Abs: 1.2 10*3/uL (ref 0.7–4.0)
MCH: 34.3 pg — ABNORMAL HIGH (ref 26.0–34.0)
MCHC: 32.8 g/dL (ref 30.0–36.0)
MCV: 104.4 fL — ABNORMAL HIGH (ref 80.0–100.0)
Monocytes Absolute: 0.4 10*3/uL (ref 0.1–1.0)
Monocytes Relative: 8 %
Neutro Abs: 3.2 10*3/uL (ref 1.7–7.7)
Neutrophils Relative %: 63 %
Platelet Count: 214 10*3/uL (ref 150–400)
RBC: 3.15 MIL/uL — ABNORMAL LOW (ref 3.87–5.11)
RDW: 14.8 % (ref 11.5–15.5)
WBC Count: 5 10*3/uL (ref 4.0–10.5)
nRBC: 0 % (ref 0.0–0.2)

## 2022-08-17 LAB — CMP (CANCER CENTER ONLY)
ALT: 31 U/L (ref 0–44)
AST: 25 U/L (ref 15–41)
Albumin: 4.6 g/dL (ref 3.5–5.0)
Alkaline Phosphatase: 86 U/L (ref 38–126)
Anion gap: 6 (ref 5–15)
BUN: 27 mg/dL — ABNORMAL HIGH (ref 8–23)
CO2: 24 mmol/L (ref 22–32)
Calcium: 10.5 mg/dL — ABNORMAL HIGH (ref 8.9–10.3)
Chloride: 107 mmol/L (ref 98–111)
Creatinine: 1.13 mg/dL — ABNORMAL HIGH (ref 0.44–1.00)
GFR, Estimated: 51 mL/min — ABNORMAL LOW (ref >60)
Glucose, Bld: 96 mg/dL (ref 70–99)
Potassium: 4.2 mmol/L (ref 3.5–5.1)
Sodium: 137 mmol/L (ref 135–145)
Total Bilirubin: 0.7 mg/dL (ref 0.3–1.2)
Total Protein: 6.9 g/dL (ref 6.5–8.1)

## 2022-08-17 LAB — LACTATE DEHYDROGENASE: LDH: 133 U/L (ref 98–192)

## 2022-08-17 LAB — MAGNESIUM: Magnesium: 1.2 mg/dL — ABNORMAL LOW (ref 1.7–2.4)

## 2022-08-17 MED ORDER — MAGNESIUM SULFATE 4 GM/100ML IV SOLN: 4.0000 g | Freq: Once | INTRAVENOUS | Status: DC

## 2022-08-17 MED ORDER — SODIUM CHLORIDE 0.9 % IV SOLN: INTRAVENOUS | Status: DC

## 2022-08-17 MED ORDER — MAGNESIUM SULFATE 2 GM/50ML IV SOLN
2.0000 g | INTRAVENOUS | Status: DC
  Administered 2022-08-17: 2 g via INTRAVENOUS
  Filled 2022-08-17: qty 50

## 2022-08-17 NOTE — Progress Notes (Signed)
Patient is doing well today. She is seeing Dr Pricilla Holm tomorrow and is hopeful she will be scheduled for a colostomy reversal. Per Dr Myna Hidalgo:  "We will have to find out when surgery will be scheduled.  If is not can be scheduled for good 6 weeks or so, then we will get her on olaparib before surgery."  Message sent to GYN Navigator asking her for updated plan once Dr Pricilla Holm saw patient.   Oncology Nurse Navigator Documentation     08/17/2022   11:45 AM  Oncology Nurse Navigator Flowsheets  Navigator Follow Up Date: 08/19/2022  Navigator Follow Up Reason: Review Note  Navigator Location [facility]  Navigator Encounter Type Follow-up Appt  Patient Visit Type MedOnc  Treatment Phase Active Tx  Barriers/Navigation Needs Coordination of Care  Interventions Coordination of Care;Psycho-Social Support  Acuity Level 2-Minimal Needs (1-2 Barriers Identified)  Coordination of Care Other  Support Groups/Services Friends and Family  Time Spent with Patient 15

## 2022-08-17 NOTE — Patient Instructions (Signed)
Magnesium Citrate Solution What is this medication? MAGNESIUM CITRATE (mag NEE zee um SI treyt) treats occasional constipation. It works by increasing the amount of water your intestine absorbs. This softens the stool, making it easier to have a bowel movement. It also increases pressure, which prompts the muscles in your intestines to move stool. It belongs to a group of medications called laxatives. This medicine may be used for other purposes; ask your health care provider or pharmacist if you have questions. COMMON BRAND NAME(S): Citroma What should I tell my care team before I take this medication? They need to know if you have any of these conditions: Are on a low magnesium or low sodium diet Change in bowel habits for 2 weeks Colostomy or ileostomy Constipation after using another laxative for 7 days Diabetes Kidney disease Rectal bleeding Stomach pain, nausea, or vomiting An unusual or allergic reaction to magnesium citrate, other magnesium products, other medications, foods, dyes, or preservatives Pregnant or trying to get pregnant Breast-feeding How should I use this medication? Take this medication by mouth. Follow the directions on the package or prescription label. Use a specially marked spoon or container to measure each dose. Ask your pharmacist if you do not have one. Household spoons are not accurate. Drink a full glass of fluid with each dose of this medication. This medication may taste better if it is chilled before you drink it. Do not take your medication more often than directed. Talk to your care team about the use of this medication in children. While this medication may be prescribed for children as young as 2 years of age for selected conditions, precautions do apply. Overdosage: If you think you have taken too much of this medicine contact a poison control center or emergency room at once. NOTE: This medicine is only for you. Do not share this medicine with  others. What if I miss a dose? This does not apply; this medication is not for regular use. What may interact with this medication? Cellulose sodium phosphate Digoxin Edetate disodium, EDTA Medications for bone strength like etidronate, ibandronate, risedronate Sodium polystyrene sulfonate Some antibiotics like ciprofloxacin, doxycycline, gatifloxacin, levofloxacin, tetracycline Vitamin D This list may not describe all possible interactions. Give your health care provider a list of all the medicines, herbs, non-prescription drugs, or dietary supplements you use. Also tell them if you smoke, drink alcohol, or use illegal drugs. Some items may interact with your medicine. What should I watch for while using this medication? Tell your care team if your symptoms do not start to get better or if they get worse. Do not take any other medication by mouth within 2 hours of taking this medication. What side effects may I notice from receiving this medication? Side effects that you should report to your care team as soon as possible: Allergic reactions--skin rash, itching, hives, swelling of the face, lips, tongue, or throat High magnesium level--confusion, drowsiness, facial flushing, redness, sweating, muscle weakness, fast or irregular heartbeat, trouble breathing Side effects that usually do not require medical attention (report to your care team if they continue or are bothersome): Diarrhea Nausea Stomach pain Vomiting This list may not describe all possible side effects. Call your doctor for medical advice about side effects. You may report side effects to FDA at 1-800-FDA-1088. Where should I keep my medication? Keep out of the reach of children. Store at room temperature or in the refrigerator between 8 and 30 degrees C (46 and 86 degrees F). Throw away any   unused medication 24 hours after opening the bottle. Throw away unopened bottles of medication after the expiration date. NOTE: This  sheet is a summary. It may not cover all possible information. If you have questions about this medicine, talk to your doctor, pharmacist, or health care provider.  2023 Elsevier/Gold Standard (2020-11-27 00:00:00)  

## 2022-08-17 NOTE — Progress Notes (Signed)
Hematology and Oncology Follow Up Visit  Maria Romero [id number] Apr 18, 1950 72 y.o. 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  She looks fantastic.  She is feeling well.  She really has had no specific complaints.  Hopefully, when she meets with Maria Romero tomorrow, they will be able to set up her colostomy reversal.  She did have a CT of the abdomen pelvis that was done.  This was done on 08/01/2022.  This did not show any evidence of residual disease from her ovarian cancer.  Her last CEA-125 was 15.3.  She has had no issues with nausea or vomiting.  She is working outside a lot.  She has had no problems with pain.  I think that it would not be a bad idea to have her on olaparib as a maintenance therapy once she has her colostomy reversed.  I think this would be very reasonable.  She has had no problems with cough or shortness of breath.  She has had no bleeding.  She has had no leg swelling.  Overall, I would say that her performance status is probably ECOG 1.      Wt Readings from Last 3 Encounters:  08/17/22 108 lb (49 kg)  07/08/22 108 lb (49 kg)  06/29/22 111 lb (50.3 kg)    Medications:  Current Outpatient Medications:    amLODipine (NORVASC) 5 MG tablet, Take 5 mg by mouth daily., Disp: , Rfl:    atorvastatin (LIPITOR) 40 MG tablet, TAKE ONE TABLET BY MOUTH EVERY NIGHT AT BEDTIME, Disp: 90 tablet, Rfl: 3   Biotin 47829 MCG TABS, Take by mouth daily., Disp: , Rfl:    Calcium-Vitamin D-Vitamin K (VIACTIV PO), Take 1 tablet by mouth in the morning and at bedtime., Disp: , Rfl:    cholecalciferol (VITAMIN D3) 25  MCG (1000 UNIT) tablet, Take 1,000 Units by mouth daily., Disp: , Rfl:    lidocaine-prilocaine (EMLA) cream, Apply 1 Application topically as needed (for port access)., Disp: , Rfl:    loperamide (IMODIUM A-D) 2 MG tablet, Take 1 tablet (2 mg total) by mouth 2 (two) times daily. Take 1 extra during the day if output high, Disp: 30 tablet, Rfl: 0   magnesium oxide (MAG-OX) 400 (240 Mg) MG tablet, Take 1 tablet (400 mg total) by mouth daily., Disp: 30 tablet, Rfl: 3   omeprazole (PRILOSEC) 20 MG capsule, Take 1 capsule (20 mg total) by mouth daily. (Patient taking differently: Take 20 mg by mouth daily before breakfast.), Disp: 90 capsule, Rfl: 2   TYLENOL 500 MG tablet, Take 500-1,000 mg by mouth every 6 (six) hours as needed for mild pain or headache. (Patient not taking: Reported on 08/17/2022), Disp: , Rfl:   Allergies:  Allergies  Allergen Reactions   Fluvastatin Sodium Other (See Comments)    Myalgias    Past Medical History, Surgical history, Social history, and Family History were reviewed and updated.  Review of Systems: Review of Systems  Constitutional: Negative.   HENT:  Negative.    Eyes: Negative.   Respiratory: Negative.    Cardiovascular: Negative.   Gastrointestinal:  Negative for abdominal pain.  Genitourinary:  Negative for pelvic pain.   Musculoskeletal: Negative.   Skin: Negative.   Neurological: Negative.   Hematological: Negative.   Psychiatric/Behavioral: Negative.      Physical Exam:  height is 5\' 3"  (1.6 m) and weight is 108 lb (49 kg). Her oral temperature is 98 F (36.7 C). Her blood pressure is 118/74 and her pulse is 76. Her respiration is 17 and oxygen saturation is 96%.   Wt Readings from Last 3 Encounters:  08/17/22 108 lb (49 kg)  07/08/22 108 lb (49 kg)  06/29/22 111 lb (50.3 kg)    Physical Exam Vitals reviewed.  HENT:     Head: Normocephalic and atraumatic.  Eyes:     Pupils: Pupils are equal, round, and reactive to light.   Cardiovascular:     Rate and Rhythm: Normal rate and regular rhythm.     Heart sounds: Normal heart sounds.  Pulmonary:     Effort: Pulmonary effort is normal.     Breath sounds: Normal breath sounds.  Chest:    Abdominal:     General: Bowel sounds are normal.     Palpations: Abdomen is soft.     Comments: Abdominal exam shows a colostomy in the right lower quadrant.  There is a well-healed laparotomy scar.  She has no fluid wave.  There is no palpable abdominal mass.  There is no palpable liver or spleen tip.  Is scantly distended  Musculoskeletal:        General: No tenderness or deformity. Normal range of motion.     Cervical back: Normal range of motion.  Lymphadenopathy:     Cervical: No cervical adenopathy.  Skin:    General: Skin is warm and dry.     Findings: No erythema or rash.  Neurological:     Mental Status: She is alert and oriented to person, place, and time.  Psychiatric:        Behavior: Behavior normal.        Thought Content: Thought content normal.        Judgment: Judgment normal.      Lab Results  Component Value Date   WBC 5.0 08/17/2022   HGB 10.8 (L) 08/17/2022   HCT 32.9 (L) 08/17/2022   MCV 104.4 (H) 08/17/2022   PLT 214 08/17/2022     Chemistry      Component Value Date/Time   NA 137 08/17/2022 0955   K 4.2 08/17/2022 0955   CL 107 08/17/2022 0955   CO2 24 08/17/2022 0955   BUN 27 (H) 08/17/2022 0955   CREATININE 1.13 (H) 08/17/2022 0955   CREATININE 0.93 11/03/2021 0000      Component Value Date/Time   CALCIUM 10.5 (H) 08/17/2022 0955   ALKPHOS 86 08/17/2022 0955   AST 25 08/17/2022 0955   ALT 31 08/17/2022 0955   BILITOT 0.7 08/17/2022 0955        Impression and Plan: Maria Romero is a very charming 72 year old white female.  She had neoadjuvant chemotherapy.  She really had a tough time with neoadjuvant chemotherapy.  She subsequently underwent surgical resection.  She did still have active disease.    Had to leave that  she is in remission right now.  I am so happy that the CT scan looked so good.  I am so happy that the CA-125 is also normal.  We will have to find out when surgery will be scheduled7.  If is not can be scheduled for good 6 weeks or so, then  we will get her on olaparib before surgery.  Again, she has been through quite a lot.  She really had a tough time with systemic chemotherapy.  We finally got her through treatment.  She then had surgery in which she did have a nice response but still had residual disease.  Afterwards, we then gave her some  "adjuvant" chemotherapy which has been quite helpful.  I would like to plan to see her back now in about a month or so.   Josph Macho, MD 5/8/202411:27 AM

## 2022-08-18 ENCOUNTER — Other Ambulatory Visit: Payer: Self-pay

## 2022-08-18 ENCOUNTER — Encounter: Payer: Self-pay | Admitting: Gynecologic Oncology

## 2022-08-18 ENCOUNTER — Inpatient Hospital Stay (HOSPITAL_BASED_OUTPATIENT_CLINIC_OR_DEPARTMENT_OTHER): Payer: Medicare Other | Admitting: Gynecologic Oncology

## 2022-08-18 VITALS — BP 109/72 | HR 78 | Temp 97.7°F | Resp 16 | Ht 64.17 in | Wt 109.4 lb

## 2022-08-18 DIAGNOSIS — Z7189 Other specified counseling: Secondary | ICD-10-CM | POA: Diagnosis not present

## 2022-08-18 DIAGNOSIS — C569 Malignant neoplasm of unspecified ovary: Secondary | ICD-10-CM

## 2022-08-18 DIAGNOSIS — C561 Malignant neoplasm of right ovary: Secondary | ICD-10-CM | POA: Diagnosis not present

## 2022-08-18 DIAGNOSIS — Z933 Colostomy status: Secondary | ICD-10-CM

## 2022-08-18 DIAGNOSIS — Z932 Ileostomy status: Secondary | ICD-10-CM

## 2022-08-18 DIAGNOSIS — Z9221 Personal history of antineoplastic chemotherapy: Secondary | ICD-10-CM | POA: Diagnosis not present

## 2022-08-18 MED ORDER — TRAMADOL HCL 50 MG PO TABS
50.0000 mg | ORAL_TABLET | Freq: Four times a day (QID) | ORAL | 0 refills | Status: DC | PRN
Start: 2022-08-18 — End: 2022-10-03

## 2022-08-18 NOTE — Progress Notes (Signed)
Gynecologic Oncology Return Clinic Visit  08/18/22  Reason for Visit: treatment planning  Treatment History: Oncology History  [name], right (HCC)  11/15/2021 Imaging   CT A/P: 1. Large volume ascites with peritoneal thickening and omental caking in the left abdomen. Imaging features are consistent with metastatic disease. 2. Contiguous with the posterior uterine fundus and extending into the right adnexal space is a poorly marginated heterogeneous 9.6 x 8.8 x 9.4 cm mass. Imaging features are consistent with neoplasm likely of right ovarian etiology. Associated heterogeneously enhancing mass involving the posterior lower uterine segment and cervix extending posteriorly towards the cul-de-sac. This may be a drop metastasis. 3. Soft tissue fullness also noted left adnexal region without normal left ovary by CT. 4. Subtle irregularity of liver contour raising the question of but not definitive for cirrhosis. 5. Small left and tiny right pleural effusions. 6. Aortic Atherosclerosis (ICD10-I70.0).   11/25/2021 Imaging   CT chest 1. Bilateral pleural effusions greatest on the LEFT both small volume but with fissural nodularity along the major fissure in the LEFT chest. Early involvement in the chest is not excluded. Sampling of pleural fluid may be helpful. 2. No adenopathy by size criteria. Tiny pulmonary nodule in the LEFT lower lobe as described. 3. Three-vessel coronary artery disease greatest in LEFT coronary circulation. 4. Reduction in malignant ascites since previous imaging following paracentesis.   12/01/2021 Initial Diagnosis   [name], right (HCC)   12/01/2021 Cancer Staging   Staging form: Ovary, Fallopian Tube, and Primary Peritoneal Carcinoma, AJCC 8th Edition - Clinical stage from 12/01/2021: FIGO Stage IIIC (cT3c, cN1b, cM0) - Signed by Josph Macho, MD on 12/01/2021 Stage prefix: Initial diagnosis Histologic grade (G): G2 Histologic grading system: 4  grade system   12/07/2021 - 06/29/2022 Chemotherapy   Patient is on Treatment Plan : OVARIAN Carboplatin (AUC 6) + Paclitaxel (175) q21d X 6 Cycles     01/11/2022 - 06/08/2022 Chemotherapy   Patient is on Treatment Plan : OVARIAN Bevacizumab q21d      Genetic Testing   Negative genetic testing. No pathogenic variants identified on the [facility] Common Hereditary Cancers+RNA panel. The report date is 01/24/2022.  The Common Hereditary Cancers Panel + RNA offered by [facility] includes sequencing and/or deletion duplication testing of the following 47 genes: APC, ATM, AXIN2, BARD1, BMPR1A, BRCA1, BRCA2, BRIP1, CDH1, CDKN2A (p14ARF), CDKN2A (p16INK4a), CKD4, CHEK2, CTNNA1, DICER1, EPCAM (Deletion/duplication testing only), GREM1 (promoter region deletion/duplication testing only), KIT, MEN1, MLH1, MSH2, MSH3, MSH6, MUTYH, NBN, NF1, NHTL1, PALB2, PDGFRA, PMS2, POLD1, POLE, PTEN, RAD50, RAD51C, RAD51D, SDHB, SDHC, SDHD, SMAD4, SMARCA4. STK11, TP53, TSC1, TSC2, and VHL.  The following genes were evaluated for sequence changes only: SDHA and HOXB13 c.251G>A variant only.   03/08/2022 Imaging   CT C/A/P: 1. Reduction in the omental caking of tumor compared to 11/15/2021, although substantial tumor remains. 2. The right adnexal mass has a volume of 220 cubic cm and appears to be primarily solid, although components may be hemorrhagic. Previous ascites has convinced into a loculated collection of fluid surrounding this adnexal mass and measuring 1400 cc. 3. Questionable bowel wall thickening in the transverse colon, although much of the appearance may be due to nondistention. Correlate with any signs of colitis. 4. Previous right pleural effusion has resolved. Residual small left pleural effusion is nonspecific for transudative or exudative etiology. 5. Other imaging findings of potential clinical significance: Substantial left anterior descending coronary artery atherosclerosis. Grade 1 degenerative  anterolisthesis L4-5. Degenerative facet arthropathy at  L4-5 and L5-S1. Old healed right posterior rib fractures. Small calcifications along the pleural margin of the left hemidiaphragm, possibly from prior asbestos exposure or prior pleurodesis. 6. Aortic atherosclerosis.   04/20/2022 Surgery   Diagnostic laparoscopy, exploratory laparotomy with lysis of adhesions for approximately 45 minutes, omentectomy including mobilization of the splenic flexure, radical tumor debulking with en bloc resection of bilateral adnexa, uterus, cervix and rectosigmoid colon, end-to-end colonic reanastomosis, diverting loop ileostomy, cystoscopy   Findings: On EUA, fixed pelvic mass with nodularity appreciated within the posterior cul-de-sac.  On intra-abdominal entry the laparoscope, upper abdominal adhesions limiting assessment of the left upper quadrant.  Large, cystic mass fills the mid abdomen.  On laparotomy, approximately 22 cm cystic mass spans from the deep pelvis to the upper abdomen with a rind.  This lesion appears to be walling off the intra-abdominal ascites.  Multiple loops of small bowel are adherent to the cystic lesion.  There is significant filmy adhesive disease between loops of small bowel, the bowel and cystic lesion, and the small bowel and anterior abdominal wall.  Also numerous less than 1 cm white nodules noted along the small bowel and mesentery (frequently within filmy adhesions), most consistent with treated tumor.  During lysis of adhesions, 2 areas of serosal injury noted along the ileum, oversewn.  Approximately 4-6 cm tumor implant within the greater omentum and omental nodularity extending along the omentum to the splenic flexure.  Lesser sac without disease.  Stomach normal in appearance.,  Adhesions between the liver and the anterior abdominal wall bilaterally, no nodularity appreciated along either the liver or diaphragm.  Appendix itself normal-appearing, appendiceal mesentery minimally  adherent to the right adnexa.  Normal cecum, ascending colon, transverse colon, and descending colon.  Large cystic mass noted to be adherent to the bladder peritoneum and uterus anteriorly within the pelvis.  Left adnexa is somewhat distorted.  Right ovary replaced by an 8 cm cystic mass.  Normal-appearing distal right fallopian fimbria.  Area of the sigmoid colon adherent posterior to the right adnexa, requiring an en bloc resection.  Somewhat woody texture palpated along the rectovaginal septum after reverse hysterectomy performed.  Negative bubble test after rectosigmoid anastomosis.  In the setting of planned additional treatment and recent receipt of bevacizumab, decision made to perform diverting loop ileostomy. Multiple adhesions and nodular implants sent.  If these are negative for malignancy or show treated tumor, then this was an R0 resection.  Otherwise, R1 resection. On cystoscopy, bladder dome intact. Good efflux seen from bilateral ureteral orifices.    04/20/2022 Pathology Results   A. SMALL BOWEL ADHESION, EXCISION: -Fibrous tissue with chronic inflammation, consistent with adhesions  B. SMALL BOWEL NODULE, EXCISION: - Benign fibrotic nodule - Negative for carcinoma  C. UTERUS, CERVIX, BILATERAL TUBES AND OVARIES, RECTOSIGMOID COLON, RESECTION: - Cervix: Benign, nabothian cyst - Endometrium: Benign inactive endometrium - Myometrium: No significant pathologic changes - Ovary: High-grade serous carcinoma - Colon: Metastatic serous carcinoma involving colonic serosa - See oncology table  D. OMENTUM, EXCISION: - Involved by high-grade serous carcinoma  E. RECTAL DONUTS, EXCISION: - Benign colonic mucosa with no significant pathologic changes   ONCOLOGY TABLE:  OVARY or FALLOPIAN TUBE or PRIMARY PERITONEUM: Resection  Procedure: Total hysterectomy, rectosigmoid colon resection, omental excision, adhesiolysis Specimen Integrity: Disrupted Tumor Site: Ovary Tumor Size:  10.5 x 7.5 x 5 cm Histologic Type: serous carcinoma Histologic Grade: High-grade Ovarian Surface Involvement: See comment Fallopian Tube Surface Involvement: Not identified Implants (required for advanced stage serous/seromucinous borderline  tumors only): Invasive implants Other Tissue/ Organ Involvement: Colonic serosa Largest Extrapelvic Peritoneal Focus: 8 cm (omental implant) Peritoneal/Ascitic Fluid Involvement: Present Chemotherapy Response Score (CRS): CRS2 Regional Lymph Nodes: Not applicable       Distant Metastasis:      Distant Site(s) Involved: None Pathologic Stage Classification (pTNM, AJCC 8th Edition): pT3c, pN[not assigned] Ancillary Studies: Can be performed upon request Representative Tumor Block: C11 Comment(s): Assessment of ovarian surface involvement is limited by disrupted and necrotic nature of the specimen. The tumor is located in the right ovary per the surgeon, an additional ovary and fallopian tube are not identified. An additional section is submitted to identify possible fallopian tube.     C8 - carbo/taxotere: 06/29/22 Germline genetic testing negative Genomic testing: LOH-high  Interval History: Patient reports overall doing very well.  She denies any abdominal or pelvic pain.  Denies any vaginal bleeding or discharge.  Reports good output through her stoma with good consistency when she takes Imodium twice daily.  Denies any bladder symptoms.  Has some ringing in her ears.  Past Medical/Surgical History: Past Medical History:  Diagnosis Date   Anemia    Complication of anesthesia    Dyspnea    Goals of care, counseling/discussion 12/01/2021   Hypercholesterolemia    Hypertension    IBS (irritable bowel syndrome)    Malignant ascites 11/19/2021   Osteopenia    Osteoporosis    [name], right (HCC) 12/01/2021   Ovarian mass, right 11/19/2021   PONV (postoperative nausea and vomiting)    Toe fracture    lt small toe    Past Surgical  History:  Procedure Laterality Date   BOWEL RESECTION N/A 04/20/2022   Procedure: BOWEL RESECTION; DIVERTING ILEOSTOMY;  Surgeon: Carver Fila, MD;  Location: [facility];  Service: Gynecology;  Laterality: N/A;   DEBULKING N/A 04/20/2022   Procedure: TUMOR DEBULKING, OMENTECTOMY;  Surgeon: Carver Fila, MD;  Location: [facility];  Service: Gynecology;  Laterality: N/A;   HYSTERECTOMY ABDOMINAL WITH SALPINGO-OOPHORECTOMY Bilateral 04/20/2022   Procedure: HYSTERECTOMY ABDOMINAL WITH BILATERAL SALPINGO-OOPHORECTOMY;  Surgeon: Carver Fila, MD;  Location: [facility];  Service: Gynecology;  Laterality: Bilateral;   IR IMAGING GUIDED PORT INSERTION  11/26/2021   IR PARACENTESIS  11/23/2021   IR PARACENTESIS  11/26/2021   LAPAROSCOPY N/A 04/20/2022   Procedure: LAPAROSCOPY DIAGNOSTIC,CYSTO;  Surgeon: Carver Fila, MD;  Location: [facility];  Service: Gynecology;  Laterality: N/A;   lump removal  04/11/1993   axilla right - benign   TONSILLECTOMY     TUBAL LIGATION  04/11/1988    Family History  Problem Relation Age of Onset   Stroke Mother    Hypertension Mother    Hyperlipidemia Mother    Glaucoma Mother    Heart attack Father 69   Diabetes Father    Hypertension Father    Hyperlipidemia Father    Cataracts Father    Breast cancer Cousin    Colon cancer Neg Hx    Ovarian cancer Neg Hx    Endometrial cancer Neg Hx    Pancreatic cancer Neg Hx    Prostate cancer Neg Hx     Social History   Socioeconomic History   Marital status: Widowed    Spouse name: Not on file   Number of children: 1   Years of education: 14   Highest education level: Associate degree: occupational, Scientist, product/process development, or vocational program  Occupational History   Occupation: retired Designer, industrial/product  Tobacco Use  Smoking status: Former    Packs/day: 1.50    Years: 20.00    Additional pack years: 0.00    Total pack years: 30.00    Types: Cigarettes    Quit date: 03/20/1990    Years since quitting: 32.4    Smokeless tobacco: Never   Tobacco comments:    quit 1988  Vaping Use   Vaping Use: Never used  Substance and Sexual Activity   Alcohol use: Not Currently    Alcohol/week: 2.0 standard drinks of alcohol    Types: 2 Glasses of wine per week    Comment: Wine in the evenings   Drug use: No   Sexual activity: Not Currently    Partners: Male  Other Topics Concern   Not on file  Social History Narrative   Lives alone with her dog. Helps with her grandson pick up and volunteering at his school. She enjoys spending time with her family.   Social Determinants of Health   Financial Resource Strain: Low Risk  (03/26/2021)   Overall Financial Resource Strain (CARDIA)    Difficulty of Paying Living Expenses: Not hard at all  Food Insecurity: No Food Insecurity (05/13/2022)   Hunger Vital Sign    Worried About Running Out of Food in the Last Year: Never true    Ran Out of Food in the Last Year: Never true  Transportation Needs: No Transportation Needs (05/13/2022)   PRAPARE - Administrator, Civil Service (Medical): No    Lack of Transportation (Non-Medical): No  Physical Activity: Inactive (03/26/2021)   Exercise Vital Sign    Days of Exercise per Week: 0 days    Minutes of Exercise per Session: 0 min  Stress: No Stress Concern Present (03/26/2021)   [employer] of Occupational Health - Occupational Stress Questionnaire    Feeling of Stress : Not at all  Social Connections: Socially Isolated (03/26/2021)   Social Connection and Isolation Panel [NHANES]    Frequency of Communication with Friends and Family: More than three times a week    Frequency of Social Gatherings with Friends and Family: Three times a week    Attends Religious Services: Never    Active Member of Clubs or Organizations: No    Attends Banker Meetings: Never    Marital Status: Widowed    Current Medications:  Current Outpatient Medications:    amLODipine (NORVASC) 5 MG tablet, Take 5  mg by mouth daily., Disp: , Rfl:    atorvastatin (LIPITOR) 40 MG tablet, TAKE ONE TABLET BY MOUTH EVERY NIGHT AT BEDTIME, Disp: 90 tablet, Rfl: 3   Biotin 40981 MCG TABS, Take by mouth daily., Disp: , Rfl:    Calcium-Vitamin D-Vitamin K (VIACTIV PO), Take 1 tablet by mouth in the morning and at bedtime., Disp: , Rfl:    cholecalciferol (VITAMIN D3) 25 MCG (1000 UNIT) tablet, Take 1,000 Units by mouth daily., Disp: , Rfl:    lidocaine-prilocaine (EMLA) cream, Apply 1 Application topically as needed (for port access)., Disp: , Rfl:    loperamide (IMODIUM A-D) 2 MG tablet, Take 1 tablet (2 mg total) by mouth 2 (two) times daily. Take 1 extra during the day if output high, Disp: 30 tablet, Rfl: 0   magnesium oxide (MAG-OX) 400 (240 Mg) MG tablet, Take 1 tablet (400 mg total) by mouth daily., Disp: 30 tablet, Rfl: 3   omeprazole (PRILOSEC) 20 MG capsule, Take 1 capsule (20 mg total) by mouth daily. (Patient taking differently: Take 20  mg by mouth daily before breakfast.), Disp: 90 capsule, Rfl: 2   TYLENOL 500 MG tablet, Take 500-1,000 mg by mouth every 6 (six) hours as needed for mild pain or headache. (Patient not taking: Reported on 08/17/2022), Disp: , Rfl:   Review of Systems: Denies appetite changes, fevers, chills, fatigue, unexplained weight changes. Denies hearing loss, neck lumps or masses or voice changes. Denies cough or wheezing.  Denies shortness of breath. Denies chest pain or palpitations. Denies leg swelling. Denies abdominal distention, pain, blood in stools, constipation, diarrhea, nausea, vomiting, or early satiety. Denies pain with intercourse, dysuria, frequency, hematuria or incontinence. Denies hot flashes, pelvic pain, vaginal bleeding or vaginal discharge.   Denies joint pain, back pain or muscle pain/cramps. Denies itching, rash, or wounds. Denies dizziness, headaches, numbness or seizures. Denies swollen lymph nodes or glands, denies easy bruising or bleeding. Denies  anxiety, depression, confusion, or decreased concentration.  Physical Exam: BP 109/72 (BP Location: Left Arm)   Pulse 78   Temp 97.7 F (36.5 C) (Oral)   Resp 16   Ht 5' 4.17" (1.63 m)   Wt 109 lb 6.4 oz (49.6 kg)   SpO2 100%   BMI 18.68 kg/m  General: Alert, oriented, no acute distress. HEENT: Normocephalic, atraumatic, sclera anicteric. Chest: Clear to auscultation bilaterally.  No wheezes or rhonchi. Cardiovascular: Regular rate and rhythm, no murmurs. Abdomen: soft, nontender.  Normoactive bowel sounds.  No masses or hepatosplenomegaly appreciated.  Well-healed scar.  Ostomy pink and viable. Extremities: Grossly normal range of motion.  Warm, well perfused.  No edema bilaterally. Skin: No rashes or lesions noted. Lymphatics: No cervical, supraclavicular, or inguinal adenopathy. GU: Normal appearing external genitalia without erythema, excoriation, or lesions.  Speculum exam reveals vaginal mucosa atrophic, no lesions.  Bimanual exam reveals cuff is somewhat posterior, mild tenderness with palpation, no masses or nodularity.  Rectovaginal exam deferred.  Laboratory & Radiologic Studies: CT A/P 08/01/22: 1. Response to therapy of pelvic primary and peritoneal metastasis. Interval hysterectomy and presumed resection of pelvic mass. No evidence of residual fluid or peritoneal metastasis. 2. Right lower quadrant loop ileostomy, without acute complication. 3. Resolution of left pleural effusion since 03/08/2022. A 4 mm left lower lobe pulmonary nodule may have enlarged compared to that exam. Recommend attention on follow-up. 4.  Aortic Atherosclerosis (ICD10-I70.0).  Component Ref Range & Units 1 mo ago (06/29/22) 2 mo ago (06/08/22) 3 mo ago (05/16/22) 4 mo ago (03/30/22) 5 mo ago (03/18/22) 5 mo ago (02/24/22) 6 mo ago (02/01/22)  Cancer Antigen (CA) 125 0.0 - 38.1 U/mL 15.3 20.4 CM 31.9 CM 167.0 High  CM 280.0 High  CM 369.0 High  CM 550.0 High  CM   CA-125 from 08/17/22:  pending  Assessment & Plan: Maria Romero is a 72 y.o. 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 Has completed 3 cycles of adjuvant chemotherapy. Germline testing negative. HRD+.  The patient is overall doing very well.  Recent imaging showed no evidence of metastatic disease with intact rectosigmoid anastomosis.  Patient would like to move forward with reversal of her ileostomy.  In terms of her cancer treatment, plan is to start her on maintenance PARPi given her HRD status by genomic testing.  We reviewed the plan for a Ileostomy reversal, possible laparotomy. The risks of surgery were discussed in detail and she understands these to include infection; wound separation; hernia; injury to adjacent organs; bleeding which may  require blood transfusion; anesthesia risk; thromboembolic events; possible death; unforeseen complications; possible need for re-exploration; medical complications such as heart attack, stroke, pleural effusion and pneumonia. The patient will receive DVT and antibiotic prophylaxis as indicated. She voiced a clear understanding. She had the opportunity to ask questions. Perioperative instructions were reviewed with her. Prescriptions for post-op medications were sent to her pharmacy of choice.   28 minutes of total time was spent for this patient encounter, including preparation, face-to-face counseling with the patient and coordination of care, and documentation of the encounter.  Eugene Garnet, MD  Division of Gynecologic Oncology  Department of Obstetrics and Gynecology  [facility]

## 2022-08-18 NOTE — H&P (View-Only) (Signed)
Gynecologic Oncology Return Clinic Visit  08/18/22  Reason for Visit: treatment planning  Treatment History: Oncology History  [name], right (HCC)  11/15/2021 Imaging   CT A/P: 1. Large volume ascites with peritoneal thickening and omental caking in the left abdomen. Imaging features are consistent with metastatic disease. 2. Contiguous with the posterior uterine fundus and extending into the right adnexal space is a poorly marginated heterogeneous 9.6 x 8.8 x 9.4 cm mass. Imaging features are consistent with neoplasm likely of right ovarian etiology. Associated heterogeneously enhancing mass involving the posterior lower uterine segment and cervix extending posteriorly towards the cul-de-sac. This may be a drop metastasis. 3. Soft tissue fullness also noted left adnexal region without normal left ovary by CT. 4. Subtle irregularity of liver contour raising the question of but not definitive for cirrhosis. 5. Small left and tiny right pleural effusions. 6. Aortic Atherosclerosis (ICD10-I70.0).   11/25/2021 Imaging   CT chest 1. Bilateral pleural effusions greatest on the LEFT both small volume but with fissural nodularity along the major fissure in the LEFT chest. Early involvement in the chest is not excluded. Sampling of pleural fluid may be helpful. 2. No adenopathy by size criteria. Tiny pulmonary nodule in the LEFT lower lobe as described. 3. Three-vessel coronary artery disease greatest in LEFT coronary circulation. 4. Reduction in malignant ascites since previous imaging following paracentesis.   12/01/2021 Initial Diagnosis   [name], right (HCC)   12/01/2021 Cancer Staging   Staging form: Ovary, Fallopian Tube, and Primary Peritoneal Carcinoma, AJCC 8th Edition - Clinical stage from 12/01/2021: FIGO Stage IIIC (cT3c, cN1b, cM0) - Signed by Ennever, Peter R, MD on 12/01/2021 Stage prefix: Initial diagnosis Histologic grade (G): G2 Histologic grading system: 4  grade system   12/07/2021 - 06/29/2022 Chemotherapy   Patient is on Treatment Plan : OVARIAN Carboplatin (AUC 6) + Paclitaxel (175) q21d X 6 Cycles     01/11/2022 - 06/08/2022 Chemotherapy   Patient is on Treatment Plan : OVARIAN Bevacizumab q21d      Genetic Testing   Negative genetic testing. No pathogenic variants identified on the [facility] Common Hereditary Cancers+RNA panel. The report date is 01/24/2022.  The Common Hereditary Cancers Panel + RNA offered by [facility] includes sequencing and/or deletion duplication testing of the following 47 genes: APC, ATM, AXIN2, BARD1, BMPR1A, BRCA1, BRCA2, BRIP1, CDH1, CDKN2A (p14ARF), CDKN2A (p16INK4a), CKD4, CHEK2, CTNNA1, DICER1, EPCAM (Deletion/duplication testing only), GREM1 (promoter region deletion/duplication testing only), KIT, MEN1, MLH1, MSH2, MSH3, MSH6, MUTYH, NBN, NF1, NHTL1, PALB2, PDGFRA, PMS2, POLD1, POLE, PTEN, RAD50, RAD51C, RAD51D, SDHB, SDHC, SDHD, SMAD4, SMARCA4. STK11, TP53, TSC1, TSC2, and VHL.  The following genes were evaluated for sequence changes only: SDHA and HOXB13 c.251G>A variant only.   03/08/2022 Imaging   CT C/A/P: 1. Reduction in the omental caking of tumor compared to 11/15/2021, although substantial tumor remains. 2. The right adnexal mass has a volume of 220 cubic cm and appears to be primarily solid, although components may be hemorrhagic. Previous ascites has convinced into a loculated collection of fluid surrounding this adnexal mass and measuring 1400 cc. 3. Questionable bowel wall thickening in the transverse colon, although much of the appearance may be due to nondistention. Correlate with any signs of colitis. 4. Previous right pleural effusion has resolved. Residual small left pleural effusion is nonspecific for transudative or exudative etiology. 5. Other imaging findings of potential clinical significance: Substantial left anterior descending coronary artery atherosclerosis. Grade 1 degenerative  anterolisthesis L4-5. Degenerative facet arthropathy at   L4-5 and L5-S1. Old healed right posterior rib fractures. Small calcifications along the pleural margin of the left hemidiaphragm, possibly from prior asbestos exposure or prior pleurodesis. 6. Aortic atherosclerosis.   04/20/2022 Surgery   Diagnostic laparoscopy, exploratory laparotomy with lysis of adhesions for approximately 45 minutes, omentectomy including mobilization of the splenic flexure, radical tumor debulking with en bloc resection of bilateral adnexa, uterus, cervix and rectosigmoid colon, end-to-end colonic reanastomosis, diverting loop ileostomy, cystoscopy   Findings: On EUA, fixed pelvic mass with nodularity appreciated within the posterior cul-de-sac.  On intra-abdominal entry the laparoscope, upper abdominal adhesions limiting assessment of the left upper quadrant.  Large, cystic mass fills the mid abdomen.  On laparotomy, approximately 22 cm cystic mass spans from the deep pelvis to the upper abdomen with a rind.  This lesion appears to be walling off the intra-abdominal ascites.  Multiple loops of small bowel are adherent to the cystic lesion.  There is significant filmy adhesive disease between loops of small bowel, the bowel and cystic lesion, and the small bowel and anterior abdominal wall.  Also numerous less than 1 cm white nodules noted along the small bowel and mesentery (frequently within filmy adhesions), most consistent with treated tumor.  During lysis of adhesions, 2 areas of serosal injury noted along the ileum, oversewn.  Approximately 4-6 cm tumor implant within the greater omentum and omental nodularity extending along the omentum to the splenic flexure.  Lesser sac without disease.  Stomach normal in appearance.,  Adhesions between the liver and the anterior abdominal wall bilaterally, no nodularity appreciated along either the liver or diaphragm.  Appendix itself normal-appearing, appendiceal mesentery minimally  adherent to the right adnexa.  Normal cecum, ascending colon, transverse colon, and descending colon.  Large cystic mass noted to be adherent to the bladder peritoneum and uterus anteriorly within the pelvis.  Left adnexa is somewhat distorted.  Right ovary replaced by an 8 cm cystic mass.  Normal-appearing distal right fallopian fimbria.  Area of the sigmoid colon adherent posterior to the right adnexa, requiring an en bloc resection.  Somewhat woody texture palpated along the rectovaginal septum after reverse hysterectomy performed.  Negative bubble test after rectosigmoid anastomosis.  In the setting of planned additional treatment and recent receipt of bevacizumab, decision made to perform diverting loop ileostomy. Multiple adhesions and nodular implants sent.  If these are negative for malignancy or show treated tumor, then this was an R0 resection.  Otherwise, R1 resection. On cystoscopy, bladder dome intact. Good efflux seen from bilateral ureteral orifices.    04/20/2022 Pathology Results   A. SMALL BOWEL ADHESION, EXCISION: -Fibrous tissue with chronic inflammation, consistent with adhesions  B. SMALL BOWEL NODULE, EXCISION: - Benign fibrotic nodule - Negative for carcinoma  C. UTERUS, CERVIX, BILATERAL TUBES AND OVARIES, RECTOSIGMOID COLON, RESECTION: - Cervix: Benign, nabothian cyst - Endometrium: Benign inactive endometrium - Myometrium: No significant pathologic changes - Ovary: High-grade serous carcinoma - Colon: Metastatic serous carcinoma involving colonic serosa - See oncology table  D. OMENTUM, EXCISION: - Involved by high-grade serous carcinoma  E. RECTAL DONUTS, EXCISION: - Benign colonic mucosa with no significant pathologic changes   ONCOLOGY TABLE:  OVARY or FALLOPIAN TUBE or PRIMARY PERITONEUM: Resection  Procedure: Total hysterectomy, rectosigmoid colon resection, omental excision, adhesiolysis Specimen Integrity: Disrupted Tumor Site: Ovary Tumor Size:  10.5 x 7.5 x 5 cm Histologic Type: serous carcinoma Histologic Grade: High-grade Ovarian Surface Involvement: See comment Fallopian Tube Surface Involvement: Not identified Implants (required for advanced stage serous/seromucinous borderline   tumors only): Invasive implants Other Tissue/ Organ Involvement: Colonic serosa Largest Extrapelvic Peritoneal Focus: 8 cm (omental implant) Peritoneal/Ascitic Fluid Involvement: Present Chemotherapy Response Score (CRS): CRS2 Regional Lymph Nodes: Not applicable       Distant Metastasis:      Distant Site(s) Involved: None Pathologic Stage Classification (pTNM, AJCC 8th Edition): pT3c, pN[not assigned] Ancillary Studies: Can be performed upon request Representative Tumor Block: C11 Comment(s): Assessment of ovarian surface involvement is limited by disrupted and necrotic nature of the specimen. The tumor is located in the right ovary per the surgeon, an additional ovary and fallopian tube are not identified. An additional section is submitted to identify possible fallopian tube.     C8 - carbo/taxotere: 06/29/22 Germline genetic testing negative Genomic testing: LOH-high  Interval History: Patient reports overall doing very well.  She denies any abdominal or pelvic pain.  Denies any vaginal bleeding or discharge.  Reports good output through her stoma with good consistency when she takes Imodium twice daily.  Denies any bladder symptoms.  Has some ringing in her ears.  Past Medical/Surgical History: Past Medical History:  Diagnosis Date   Anemia    Complication of anesthesia    Dyspnea    Goals of care, counseling/discussion 12/01/2021   Hypercholesterolemia    Hypertension    IBS (irritable bowel syndrome)    Malignant ascites 11/19/2021   Osteopenia    Osteoporosis    [name], right (HCC) 12/01/2021   Ovarian mass, right 11/19/2021   PONV (postoperative nausea and vomiting)    Toe fracture    lt small toe    Past Surgical  History:  Procedure Laterality Date   BOWEL RESECTION N/A 04/20/2022   Procedure: BOWEL RESECTION; DIVERTING ILEOSTOMY;  Surgeon: Angeleena Dueitt R, MD;  Location: [facility];  Service: Gynecology;  Laterality: N/A;   DEBULKING N/A 04/20/2022   Procedure: TUMOR DEBULKING, OMENTECTOMY;  Surgeon: Martina Brodbeck R, MD;  Location: [facility];  Service: Gynecology;  Laterality: N/A;   HYSTERECTOMY ABDOMINAL WITH SALPINGO-OOPHORECTOMY Bilateral 04/20/2022   Procedure: HYSTERECTOMY ABDOMINAL WITH BILATERAL SALPINGO-OOPHORECTOMY;  Surgeon: Zebulon Gantt R, MD;  Location: [facility];  Service: Gynecology;  Laterality: Bilateral;   IR IMAGING GUIDED PORT INSERTION  11/26/2021   IR PARACENTESIS  11/23/2021   IR PARACENTESIS  11/26/2021   LAPAROSCOPY N/A 04/20/2022   Procedure: LAPAROSCOPY DIAGNOSTIC,CYSTO;  Surgeon: Lavaya Defreitas R, MD;  Location: [facility];  Service: Gynecology;  Laterality: N/A;   lump removal  04/11/1993   axilla right - benign   TONSILLECTOMY     TUBAL LIGATION  04/11/1988    Family History  Problem Relation Age of Onset   Stroke Mother    Hypertension Mother    Hyperlipidemia Mother    Glaucoma Mother    Heart attack Father 56   Diabetes Father    Hypertension Father    Hyperlipidemia Father    Cataracts Father    Breast cancer Cousin    Colon cancer Neg Hx    Ovarian cancer Neg Hx    Endometrial cancer Neg Hx    Pancreatic cancer Neg Hx    Prostate cancer Neg Hx     Social History   Socioeconomic History   Marital status: Widowed    Spouse name: Not on file   Number of children: 1   Years of education: 14   Highest education level: Associate degree: occupational, technical, or vocational program  Occupational History   Occupation: retired lab tech  Tobacco Use     Smoking status: Former    Packs/day: 1.50    Years: 20.00    Additional pack years: 0.00    Total pack years: 30.00    Types: Cigarettes    Quit date: 03/20/1990    Years since quitting: 32.4    Smokeless tobacco: Never   Tobacco comments:    quit 1988  Vaping Use   Vaping Use: Never used  Substance and Sexual Activity   Alcohol use: Not Currently    Alcohol/week: 2.0 standard drinks of alcohol    Types: 2 Glasses of wine per week    Comment: Wine in the evenings   Drug use: No   Sexual activity: Not Currently    Partners: Male  Other Topics Concern   Not on file  Social History Narrative   Lives alone with her dog. Helps with her grandson pick up and volunteering at his school. She enjoys spending time with her family.   Social Determinants of Health   Financial Resource Strain: Low Risk  (03/26/2021)   Overall Financial Resource Strain (CARDIA)    Difficulty of Paying Living Expenses: Not hard at all  Food Insecurity: No Food Insecurity (05/13/2022)   Hunger Vital Sign    Worried About Running Out of Food in the Last Year: Never true    Ran Out of Food in the Last Year: Never true  Transportation Needs: No Transportation Needs (05/13/2022)   PRAPARE - Transportation    Lack of Transportation (Medical): No    Lack of Transportation (Non-Medical): No  Physical Activity: Inactive (03/26/2021)   Exercise Vital Sign    Days of Exercise per Week: 0 days    Minutes of Exercise per Session: 0 min  Stress: No Stress Concern Present (03/26/2021)   [employer] - Occupational Stress Questionnaire    Feeling of Stress : Not at all  Social Connections: Socially Isolated (03/26/2021)   Social Connection and Isolation Panel [NHANES]    Frequency of Communication with Friends and Family: More than three times a week    Frequency of Social Gatherings with Friends and Family: Three times a week    Attends Religious Services: Never    Active Member of Clubs or Organizations: No    Attends Club or Organization Meetings: Never    Marital Status: Widowed    Current Medications:  Current Outpatient Medications:    amLODipine (NORVASC) 5 MG tablet, Take 5  mg by mouth daily., Disp: , Rfl:    atorvastatin (LIPITOR) 40 MG tablet, TAKE ONE TABLET BY MOUTH EVERY NIGHT AT BEDTIME, Disp: 90 tablet, Rfl: 3   Biotin 10000 MCG TABS, Take by mouth daily., Disp: , Rfl:    Calcium-Vitamin D-Vitamin K (VIACTIV PO), Take 1 tablet by mouth in the morning and at bedtime., Disp: , Rfl:    cholecalciferol (VITAMIN D3) 25 MCG (1000 UNIT) tablet, Take 1,000 Units by mouth daily., Disp: , Rfl:    lidocaine-prilocaine (EMLA) cream, Apply 1 Application topically as needed (for port access)., Disp: , Rfl:    loperamide (IMODIUM A-D) 2 MG tablet, Take 1 tablet (2 mg total) by mouth 2 (two) times daily. Take 1 extra during the day if output high, Disp: 30 tablet, Rfl: 0   magnesium oxide (MAG-OX) 400 (240 Mg) MG tablet, Take 1 tablet (400 mg total) by mouth daily., Disp: 30 tablet, Rfl: 3   omeprazole (PRILOSEC) 20 MG capsule, Take 1 capsule (20 mg total) by mouth daily. (Patient taking differently: Take 20   mg by mouth daily before breakfast.), Disp: 90 capsule, Rfl: 2   TYLENOL 500 MG tablet, Take 500-1,000 mg by mouth every 6 (six) hours as needed for mild pain or headache. (Patient not taking: Reported on 08/17/2022), Disp: , Rfl:   Review of Systems: Denies appetite changes, fevers, chills, fatigue, unexplained weight changes. Denies hearing loss, neck lumps or masses or voice changes. Denies cough or wheezing.  Denies shortness of breath. Denies chest pain or palpitations. Denies leg swelling. Denies abdominal distention, pain, blood in stools, constipation, diarrhea, nausea, vomiting, or early satiety. Denies pain with intercourse, dysuria, frequency, hematuria or incontinence. Denies hot flashes, pelvic pain, vaginal bleeding or vaginal discharge.   Denies joint pain, back pain or muscle pain/cramps. Denies itching, rash, or wounds. Denies dizziness, headaches, numbness or seizures. Denies swollen lymph nodes or glands, denies easy bruising or bleeding. Denies  anxiety, depression, confusion, or decreased concentration.  Physical Exam: BP 109/72 (BP Location: Left Arm)   Pulse 78   Temp 97.7 F (36.5 C) (Oral)   Resp 16   Ht 5' 4.17" (1.63 m)   Wt 109 lb 6.4 oz (49.6 kg)   SpO2 100%   BMI 18.68 kg/m  General: Alert, oriented, no acute distress. HEENT: Normocephalic, atraumatic, sclera anicteric. Chest: Clear to auscultation bilaterally.  No wheezes or rhonchi. Cardiovascular: Regular rate and rhythm, no murmurs. Abdomen: soft, nontender.  Normoactive bowel sounds.  No masses or hepatosplenomegaly appreciated.  Well-healed scar.  Ostomy pink and viable. Extremities: Grossly normal range of motion.  Warm, well perfused.  No edema bilaterally. Skin: No rashes or lesions noted. Lymphatics: No cervical, supraclavicular, or inguinal adenopathy. GU: Normal appearing external genitalia without erythema, excoriation, or lesions.  Speculum exam reveals vaginal mucosa atrophic, no lesions.  Bimanual exam reveals cuff is somewhat posterior, mild tenderness with palpation, no masses or nodularity.  Rectovaginal exam deferred.  Laboratory & Radiologic Studies: CT A/P 08/01/22: 1. Response to therapy of pelvic primary and peritoneal metastasis. Interval hysterectomy and presumed resection of pelvic mass. No evidence of residual fluid or peritoneal metastasis. 2. Right lower quadrant loop ileostomy, without acute complication. 3. Resolution of left pleural effusion since 03/08/2022. A 4 mm left lower lobe pulmonary nodule may have enlarged compared to that exam. Recommend attention on follow-up. 4.  Aortic Atherosclerosis (ICD10-I70.0).  Component Ref Range & Units 1 mo ago (06/29/22) 2 mo ago (06/08/22) 3 mo ago (05/16/22) 4 mo ago (03/30/22) 5 mo ago (03/18/22) 5 mo ago (02/24/22) 6 mo ago (02/01/22)  Cancer Antigen (CA) 125 0.0 - 38.1 U/mL 15.3 20.4 CM 31.9 CM 167.0 High  CM 280.0 High  CM 369.0 High  CM 550.0 High  CM   CA-125 from 08/17/22:  pending  Assessment & Plan: Maria Romero is a 71 y.o. 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 Has completed 3 cycles of adjuvant chemotherapy. Germline testing negative. HRD+.  The patient is overall doing very well.  Recent imaging showed no evidence of metastatic disease with intact rectosigmoid anastomosis.  Patient would like to move forward with reversal of her ileostomy.  In terms of her cancer treatment, plan is to start her on maintenance PARPi given her HRD status by genomic testing.  We reviewed the plan for a Ileostomy reversal, possible laparotomy. The risks of surgery were discussed in detail and she understands these to include infection; wound separation; hernia; injury to adjacent organs; bleeding which may   require blood transfusion; anesthesia risk; thromboembolic events; possible death; unforeseen complications; possible need for re-exploration; medical complications such as heart attack, stroke, pleural effusion and pneumonia. The patient will receive DVT and antibiotic prophylaxis as indicated. She voiced a clear understanding. She had the opportunity to ask questions. Perioperative instructions were reviewed with her. Prescriptions for post-op medications were sent to her pharmacy of choice.   28 minutes of total time was spent for this patient encounter, including preparation, face-to-face counseling with the patient and coordination of care, and documentation of the encounter.  Letcher Schweikert, MD  Division of Gynecologic Oncology  Department of Obstetrics and Gynecology  [facility]   

## 2022-08-18 NOTE — Patient Instructions (Addendum)
Preparing for your Surgery   Plan for surgery on Sep 07, 2022 with Dr. Eugene Garnet at [facility]. You will be scheduled for ostomy reversal.    Plan for stay in the hospital for 1-4 days, based on bowel return.   Pre-operative Testing -You will receive a phone call from presurgical testing at [facility] to arrange for a pre-operative appointment and lab work.   -Bring your insurance card, copy of an advanced directive if applicable, medication list   -At that visit, you will be asked to sign a consent for a possible blood transfusion in case a transfusion becomes necessary during surgery.  The need for a blood transfusion is rare but having consent is a necessary part of your care.      -You should not be taking blood thinners or aspirin at least ten days prior to surgery unless instructed by your surgeon.   -Do not take supplements such as fish oil (omega 3), red yeast rice, turmeric before your surgery. You want to avoid medications with aspirin in them including headache powders such as BC or Goody's), Excedrin migraine.   Day Before Surgery at Home -You will be asked to take in a light diet the day before surgery. You will be advised you can have clear liquids up until 3 hours before your surgery.     Eat a light diet the day before surgery.  Examples including soups, broths, toast, yogurt, mashed potatoes.  AVOID GAS PRODUCING FOODS AND BEVERAGES. Things to avoid include carbonated beverages (fizzy beverages, sodas), raw fruits and raw vegetables (uncooked), or beans.    If your bowels are filled with gas, your surgeon will have difficulty visualizing your pelvic organs which increases your surgical risks.   Your role in recovery Your role is to become active as soon as directed by your doctor, while still giving yourself time to heal.  Rest when you feel tired. You will be asked to do the following in order to speed your recovery:   - Cough and breathe  deeply. This helps to clear and expand your lungs and can prevent pneumonia after surgery.  - STAY ACTIVE WHEN YOU GET HOME. Do mild physical activity. Walking or moving your legs help your circulation and body functions return to normal. Do not try to get up or walk alone the first time after surgery.   -If you develop swelling on one leg or the other, pain in the back of your leg, redness/warmth in one of your legs, please call the office or go to the Emergency Room to have a doppler to rule out a blood clot. For shortness of breath, chest pain-seek care in the Emergency Room as soon as possible. - Actively manage your pain. Managing your pain lets you move in comfort. We will ask you to rate your pain on a scale of zero to 10. It is your responsibility to tell your doctor or nurse where and how much you hurt so your pain can be treated.   Special Considerations -If you are diabetic, you may be placed on insulin after surgery to have closer control over your blood sugars to promote healing and recovery.  This does not mean that you will be discharged on insulin.  If applicable, your oral antidiabetics will be resumed when you are tolerating a solid diet.   -Your final pathology results from surgery should be available around one week after surgery and the results will be relayed to you when  available.   -Dr. Antionette Char is the surgeon that assists your GYN Oncologist with surgery.  If you end up staying the night, the next day after your surgery you will either see Dr. Pricilla Holm, Dr. Alvester Morin, or Dr. Antionette Char.   -FMLA forms can be faxed to [phone] and please allow 5-7 business days for completion.   Pain Management After Surgery -Make sure that you have Tylenol IF YOU ARE ABLE TO TAKE THESE MEDICATION at home to use on a regular basis after surgery for pain control.    -Review the attached handout on narcotic use and their risks and side effects.    Bowel Regimen -It is  important to prevent constipation and drink adequate amounts of liquids. We typically will avoid laxative use after surgery but you can use fiber like metamucil.   Risks of Surgery Risks of surgery are low but include bleeding, infection, damage to surrounding structures, re-operation, blood clots, and very rarely death.     Blood Transfusion Information (For the consent to be signed before surgery)   We will be checking your blood type before surgery so in case of emergencies, we will know what type of blood you would need.                                             WHAT IS A BLOOD TRANSFUSION?   A transfusion is the replacement of blood or some of its parts. Blood is made up of multiple cells which provide different functions. Red blood cells carry oxygen and are used for blood loss replacement. White blood cells fight against infection. Platelets control bleeding. Plasma helps clot blood. Other blood products are available for specialized needs, such as hemophilia or other clotting disorders. BEFORE THE TRANSFUSION  Who gives blood for transfusions?  You may be able to donate blood to be used at a later date on yourself (autologous donation). Relatives can be asked to donate blood. This is generally not any safer than if you have received blood from a stranger. The same precautions are taken to ensure safety when a relative's blood is donated. Healthy volunteers who are fully evaluated to make sure their blood is safe. This is blood bank blood. Transfusion therapy is the safest it has ever been in the practice of medicine. Before blood is taken from a donor, a complete history is taken to make sure that person has no history of diseases nor engages in risky social behavior (examples are intravenous drug use or sexual activity with multiple partners). The donor's travel history is screened to minimize risk of transmitting infections, such as malaria. The donated blood is tested for signs of  infectious diseases, such as HIV and hepatitis. The blood is then tested to be sure it is compatible with you in order to minimize the chance of a transfusion reaction. If you or a relative donates blood, this is often done in anticipation of surgery and is not appropriate for emergency situations. It takes many days to process the donated blood. RISKS AND COMPLICATIONS Although transfusion therapy is very safe and saves many lives, the main dangers of transfusion include:  Getting an infectious disease. Developing a transfusion reaction. This is an allergic reaction to something in the blood you were given. Every precaution is taken to prevent this. The decision to have a blood transfusion has been considered carefully  by your caregiver before blood is given. Blood is not given unless the benefits outweigh the risks.   AFTER SURGERY INSTRUCTIONS   Return to work: 4-6 weeks if applicable  You will most likely be prescribed 2 weeks of blood thinner, Eliquis, to prevent blood clots after surgery. Since this is a blood thinner, you will need to monitor for abnormal bleeding and call the office.   Activity: 1. Be up and out of the bed during the day.  Take a nap if needed.  You may walk up steps but be careful and use the hand rail.  Stair climbing will tire you more than you think, you may need to stop part way and rest.    2. No lifting or straining for 6 weeks over 10 pounds. No pushing, pulling, straining for 6 weeks.   3. No driving for around 1 week(s).  Do not drive if you are taking narcotic pain medicine and make sure that your reaction time has returned.    4. You can shower as soon as the next day after surgery. Shower daily.  Use your regular soap and water (not directly on the incision) and pat your incision(s) dry afterwards; don't rub.  No tub baths or submerging your body in water until cleared by your surgeon. If you have the soap that was given to you by pre-surgical testing that  was used before surgery, you do not need to use it afterwards because this can irritate your incisions.    5. You may experience a small amount of clear drainage from your incision, which is normal.  If the drainage persists, increases, or changes color please call the office.   6. Do not use creams, lotions, or ointments such as neosporin on your incision after surgery until advised by your surgeon.   7. Take Tylenol first for pain if you are able to take these medication and only use narcotic pain medication for severe pain not relieved by the Tylenol.  Monitor your Tylenol intake to a max of 4,000 mg in a 24 hour period.    Diet: 1. Low sodium Heart Healthy Diet is recommended but you are cleared to resume your normal (before surgery) diet after your procedure.   Wound Care: 1. Keep clean and dry.  Shower daily.   Reasons to call the Doctor: Fever - Oral temperature greater than 100.4 degrees Fahrenheit Foul-smelling vaginal discharge Difficulty urinating Nausea and vomiting Increased pain at the site of the incision that is unrelieved with pain medicine. Difficulty breathing with or without chest pain New calf pain especially if only on one side Sudden, continuing increased vaginal bleeding with or without clots.   Contacts: For questions or concerns you should contact:   Dr. Eugene Garnet at [phone]   Warner Mccreedy, NP at [phone]   After Hours: call [phone] and have the GYN Oncologist paged/contacted (after 5 pm or on the weekends). You will speak with an after hours RN and let he or she know you have had surgery.   Messages sent via mychart are for non-urgent matters and are not responded to after hours so for urgent needs, please call the after hours number.

## 2022-08-19 ENCOUNTER — Encounter: Payer: Self-pay | Admitting: Gynecologic Oncology

## 2022-08-19 ENCOUNTER — Encounter: Payer: Self-pay | Admitting: Hematology & Oncology

## 2022-08-19 ENCOUNTER — Encounter: Payer: Self-pay | Admitting: *Deleted

## 2022-08-19 LAB — CA 125: Cancer Antigen (CA) 125: 12.2 U/mL (ref 0.0–38.1)

## 2022-08-19 NOTE — Patient Instructions (Signed)
Preparing for your Surgery   Plan for surgery on Sep 07, 2022 with Dr. Katherine Tucker at [facility]. You will be scheduled for ostomy reversal.    Plan for stay in the hospital for 1-4 days, based on bowel return.   Pre-operative Testing -You will receive a phone call from presurgical testing at [facility] to arrange for a pre-operative appointment and lab work.   -Bring your insurance card, copy of an advanced directive if applicable, medication list   -At that visit, you will be asked to sign a consent for a possible blood transfusion in case a transfusion becomes necessary during surgery.  The need for a blood transfusion is rare but having consent is a necessary part of your care.      -You should not be taking blood thinners or aspirin at least ten days prior to surgery unless instructed by your surgeon.   -Do not take supplements such as fish oil (omega 3), red yeast rice, turmeric before your surgery. You want to avoid medications with aspirin in them including headache powders such as BC or Goody's), Excedrin migraine.   Day Before Surgery at Home -You will be asked to take in a light diet the day before surgery. You will be advised you can have clear liquids up until 3 hours before your surgery.     Eat a light diet the day before surgery.  Examples including soups, broths, toast, yogurt, mashed potatoes.  AVOID GAS PRODUCING FOODS AND BEVERAGES. Things to avoid include carbonated beverages (fizzy beverages, sodas), raw fruits and raw vegetables (uncooked), or beans.    If your bowels are filled with gas, your surgeon will have difficulty visualizing your pelvic organs which increases your surgical risks.   Your role in recovery Your role is to become active as soon as directed by your doctor, while still giving yourself time to heal.  Rest when you feel tired. You will be asked to do the following in order to speed your recovery:   - Cough and breathe  deeply. This helps to clear and expand your lungs and can prevent pneumonia after surgery.  - STAY ACTIVE WHEN YOU GET HOME. Do mild physical activity. Walking or moving your legs help your circulation and body functions return to normal. Do not try to get up or walk alone the first time after surgery.   -If you develop swelling on one leg or the other, pain in the back of your leg, redness/warmth in one of your legs, please call the office or go to the Emergency Room to have a doppler to rule out a blood clot. For shortness of breath, chest pain-seek care in the Emergency Room as soon as possible. - Actively manage your pain. Managing your pain lets you move in comfort. We will ask you to rate your pain on a scale of zero to 10. It is your responsibility to tell your doctor or nurse where and how much you hurt so your pain can be treated.   Special Considerations -If you are diabetic, you may be placed on insulin after surgery to have closer control over your blood sugars to promote healing and recovery.  This does not mean that you will be discharged on insulin.  If applicable, your oral antidiabetics will be resumed when you are tolerating a solid diet.   -Your final pathology results from surgery should be available around one week after surgery and the results will be relayed to you when   available.   -Dr. Lisa [name]-Moore is the surgeon that assists your GYN Oncologist with surgery.  If you end up staying the night, the next day after your surgery you will either see Dr. Tucker, Dr. Newton, or Dr. Lisa [name]-Moore.   -FMLA forms can be faxed to [phone] and please allow 5-7 business days for completion.   Pain Management After Surgery -Make sure that you have Tylenol IF YOU ARE ABLE TO TAKE THESE MEDICATION at home to use on a regular basis after surgery for pain control.    -Review the attached handout on narcotic use and their risks and side effects.    Bowel Regimen -It is  important to prevent constipation and drink adequate amounts of liquids. We typically will avoid laxative use after surgery but you can use fiber like metamucil.   Risks of Surgery Risks of surgery are low but include bleeding, infection, damage to surrounding structures, re-operation, blood clots, and very rarely death.     Blood Transfusion Information (For the consent to be signed before surgery)   We will be checking your blood type before surgery so in case of emergencies, we will know what type of blood you would need.                                             WHAT IS A BLOOD TRANSFUSION?   A transfusion is the replacement of blood or some of its parts. Blood is made up of multiple cells which provide different functions. Red blood cells carry oxygen and are used for blood loss replacement. White blood cells fight against infection. Platelets control bleeding. Plasma helps clot blood. Other blood products are available for specialized needs, such as hemophilia or other clotting disorders. BEFORE THE TRANSFUSION  Who gives blood for transfusions?  You may be able to donate blood to be used at a later date on yourself (autologous donation). Relatives can be asked to donate blood. This is generally not any safer than if you have received blood from a stranger. The same precautions are taken to ensure safety when a relative's blood is donated. Healthy volunteers who are fully evaluated to make sure their blood is safe. This is blood bank blood. Transfusion therapy is the safest it has ever been in the practice of medicine. Before blood is taken from a donor, a complete history is taken to make sure that person has no history of diseases nor engages in risky social behavior (examples are intravenous drug use or sexual activity with multiple partners). The donor's travel history is screened to minimize risk of transmitting infections, such as malaria. The donated blood is tested for signs of  infectious diseases, such as HIV and hepatitis. The blood is then tested to be sure it is compatible with you in order to minimize the chance of a transfusion reaction. If you or a relative donates blood, this is often done in anticipation of surgery and is not appropriate for emergency situations. It takes many days to process the donated blood. RISKS AND COMPLICATIONS Although transfusion therapy is very safe and saves many lives, the main dangers of transfusion include:  Getting an infectious disease. Developing a transfusion reaction. This is an allergic reaction to something in the blood you were given. Every precaution is taken to prevent this. The decision to have a blood transfusion has been considered carefully   by your caregiver before blood is given. Blood is not given unless the benefits outweigh the risks.   AFTER SURGERY INSTRUCTIONS   Return to work: 4-6 weeks if applicable  You will most likely be prescribed 2 weeks of blood thinner, Eliquis, to prevent blood clots after surgery. Since this is a blood thinner, you will need to monitor for abnormal bleeding and call the office.   Activity: 1. Be up and out of the bed during the day.  Take a nap if needed.  You may walk up steps but be careful and use the hand rail.  Stair climbing will tire you more than you think, you may need to stop part way and rest.    2. No lifting or straining for 6 weeks over 10 pounds. No pushing, pulling, straining for 6 weeks.   3. No driving for around 1 week(s).  Do not drive if you are taking narcotic pain medicine and make sure that your reaction time has returned.    4. You can shower as soon as the next day after surgery. Shower daily.  Use your regular soap and water (not directly on the incision) and pat your incision(s) dry afterwards; don't rub.  No tub baths or submerging your body in water until cleared by your surgeon. If you have the soap that was given to you by pre-surgical testing that  was used before surgery, you do not need to use it afterwards because this can irritate your incisions.    5. You may experience a small amount of clear drainage from your incision, which is normal.  If the drainage persists, increases, or changes color please call the office.   6. Do not use creams, lotions, or ointments such as neosporin on your incision after surgery until advised by your surgeon.   7. Take Tylenol first for pain if you are able to take these medication and only use narcotic pain medication for severe pain not relieved by the Tylenol.  Monitor your Tylenol intake to a max of 4,000 mg in a 24 hour period.    Diet: 1. Low sodium Heart Healthy Diet is recommended but you are cleared to resume your normal (before surgery) diet after your procedure.   Wound Care: 1. Keep clean and dry.  Shower daily.   Reasons to call the Doctor: Fever - Oral temperature greater than 100.4 degrees Fahrenheit Foul-smelling vaginal discharge Difficulty urinating Nausea and vomiting Increased pain at the site of the incision that is unrelieved with pain medicine. Difficulty breathing with or without chest pain New calf pain especially if only on one side Sudden, continuing increased vaginal bleeding with or without clots.   Contacts: For questions or concerns you should contact:   Dr. Katherine Tucker at [phone]   Romell Cavanah, NP at [phone]   After Hours: call [phone] and have the GYN Oncologist paged/contacted (after 5 pm or on the weekends). You will speak with an after hours RN and let he or she know you have had surgery.   Messages sent via mychart are for non-urgent matters and are not responded to after hours so for urgent needs, please call the after hours number. 

## 2022-08-19 NOTE — Progress Notes (Signed)
Patient scheduled for surgery 09/07/22. Do Ennever notified.   Oncology Nurse Navigator Documentation     08/19/2022    8:00 AM  Oncology Nurse Navigator Flowsheets  Navigator Follow Up Date: 09/07/2022  Navigator Follow Up Reason: Surgery  Navigator Location [facility]  Navigator Encounter Type Appt/Treatment Plan Review  Patient Visit Type MedOnc  Treatment Phase Active Tx  Barriers/Navigation Needs Coordination of Care  Interventions None Required  Acuity Level 2-Minimal Needs (1-2 Barriers Identified)  Support Groups/Services Friends and Family  Time Spent with Patient 15

## 2022-08-19 NOTE — Progress Notes (Signed)
Patient here for follow up with Dr. Pricilla Holm and for a pre-operative appointment prior to her scheduled surgery on Sep 07, 2022. She is scheduled for ileostomy reversal.  The surgery was discussed in detail.  See after visit summary for additional details.    Discussed post-op pain management in detail including the aspects of the enhanced recovery pathway.  Advised her that a new prescription would be sent in for tramadol and it is only to be used for after her upcoming surgery.  We discussed the use of tylenol post-op and to monitor for a maximum of 4,000 mg in a 24 hour period.    Discussed the use of SCDs and measures to take at home to prevent DVT including frequent mobility.  Reportable signs and symptoms of DVT discussed. Post-operative instructions discussed and expectations for after surgery. Incisional care discussed as well including reportable signs and symptoms including erythema, drainage, wound separation.     10 minutes spent with the patient/preparing information.  Verbalizing understanding of material discussed. No needs or concerns voiced at the end of the visit.   Advised patient to call for any needs.  Advised that her post-operative medications had been prescribed and could be picked up at any time.    This appointment is included in the global surgical bundle as pre-operative teaching and has no charge.

## 2022-08-28 NOTE — Patient Instructions (Signed)
SURGICAL WAITING ROOM VISITATION Patients having surgery or a procedure may have no more than 2 support people in the waiting area - these visitors may rotate in the visitor waiting room.   Due to an increase in RSV and influenza rates and associated hospitalizations, children ages 78 and under may not visit patients in [facility] hospitals. If the patient needs to stay at the hospital during part of their recovery, the visitor guidelines for inpatient rooms apply.  PRE-OP VISITATION  Pre-op nurse will coordinate an appropriate time for 1 support person to accompany the patient in pre-op.  This support person may not rotate.  This visitor will be contacted when the time is appropriate for the visitor to come back in the pre-op area.  Please refer to the [facility] website for the visitor guidelines for Inpatients (after your surgery is over and you are in a regular room).  You are not required to quarantine at this time prior to your surgery. However, you must do this: Hand Hygiene often Do NOT share personal items Notify your provider if you are in close contact with someone who has COVID or you develop fever 100.4 or greater, new onset of sneezing, cough, sore throat, shortness of breath or body aches.  If you test positive for Covid or have been in contact with anyone that has tested positive in the last 10 days please notify you surgeon.    Your procedure is scheduled on:  Wednesday  Sep 07, 2022 Report to [facility] Main Entrance: [facility] entrance where the [employer] is available.   Report to admitting at: 09:45 AM  +++++Call this number if you have any questions or problems the morning of surgery [phone]  Do not eat food after Midnight the night prior to your surgery/procedure.  After Midnight you may have the following liquids until  09:00  AM DAY OF SURGERY  Clear Liquid Diet Water Black Coffee (sugar ok, NO MILK/CREAM OR CREAMERS)  Tea (sugar ok, NO  MILK/CREAM OR CREAMERS) regular and decaf                             Plain Jell-O  with no fruit (NO RED)                                           Fruit ices (not with fruit pulp, NO RED)                                     Popsicles (NO RED)                                                                  Juice: apple, WHITE grape, WHITE cranberry Sports drinks like Gatorade or Powerade (NO RED)               FOLLOW BOWEL PREP AND ANY ADDITIONAL PRE OP INSTRUCTIONS YOU RECEIVED FROM YOUR SURGEON'S OFFICE!!!   Oral Hygiene is also important to reduce your risk of infection.  Remember - BRUSH YOUR TEETH THE MORNING OF SURGERY WITH YOUR REGULAR TOOTHPASTE  Do NOT smoke after Midnight the night before surgery.  Take ONLY these medicines the morning of surgery with A SIP OF WATER: omeprazole, amlodipine                   You may not have any metal on your body including hair pins, jewelry, and body piercing  Do not wear make-up, lotions, powders, perfumes or deodorant  Do not wear nail polish including gel and S&S, artificial / acrylic nails, or any other type of covering on natural nails including finger and toenails. If you have artificial nails, gel coating, etc., that needs to be removed by a nail salon, Please have this removed prior to surgery. Not doing so may mean that your surgery could be cancelled or delayed if the Surgeon or anesthesia staff feels like they are unable to monitor you safely.   Do not shave 48 hours prior to surgery to avoid nicks in your skin which may contribute to postoperative infections.   You may bring a small overnight bag with you on the day of surgery, only pack items that are not valuable. [facility] IS NOT RESPONSIBLE   FOR VALUABLES THAT ARE LOST OR STOLEN.    Do not bring your home medications to the hospital. The Pharmacy will dispense medications listed on your medication list to you during your admission in the Hospital.  Special  Instructions: Bring a copy of your healthcare power of attorney and living will documents the day of surgery, if you wish to have them scanned into your [facility]- EPIC  Please read over the following fact sheets you were given: IF YOU HAVE QUESTIONS ABOUT YOUR PRE-OP INSTRUCTIONS, PLEASE CALL [phone].   [facility] - Preparing for Surgery Before surgery, you can play an important role.  Because skin is not sterile, your skin needs to be as free of germs as possible.  You can reduce the number of germs on your skin by washing with CHG (chlorahexidine gluconate) soap before surgery.  CHG is an antiseptic cleaner which kills germs and bonds with the skin to continue killing germs even after washing. Please DO NOT use if you have an allergy to CHG or antibacterial soaps.  If your skin becomes reddened/irritated stop using the CHG and inform your nurse when you arrive at Short Stay. Do not shave (including legs and underarms) for at least 48 hours prior to the first CHG shower.  You may shave your face/neck.  Please follow these instructions carefully:  1.  Shower with CHG Soap the night before surgery and the  morning of surgery.  2.  If you choose to wash your hair, wash your hair first as usual with your normal  shampoo.  3.  After you shampoo, rinse your hair and body thoroughly to remove the shampoo.                             4.  Use CHG as you would any other liquid soap.  You can apply chg directly to the skin and wash.  Gently with a scrungie or clean washcloth.  5.  Apply the CHG Soap to your body ONLY FROM THE NECK DOWN.   Do not use on face/ open  Wound or open sores. Avoid contact with eyes, ears mouth and genitals (private parts).                       Wash face,  Genitals (private parts) with your normal soap.             6.  Wash thoroughly, paying special attention to the area where your  surgery  will be performed.  7.  Thoroughly rinse  your body with warm water from the neck down.  8.  DO NOT shower/wash with your normal soap after using and rinsing off the CHG Soap.            9.  Pat yourself dry with a clean towel.            10.  Wear clean pajamas.            11.  Place clean sheets on your bed the night of your first shower and do not  sleep with pets.  ON THE DAY OF SURGERY : Do not apply any lotions/deodorants the morning of surgery.  Please wear clean clothes to the hospital/surgery center.    FAILURE TO FOLLOW THESE INSTRUCTIONS MAY RESULT IN THE CANCELLATION OF YOUR SURGERY  PATIENT SIGNATURE_________________________________  NURSE SIGNATURE__________________________________  ________________________________________________________________________

## 2022-08-28 NOTE — Progress Notes (Signed)
COVID Vaccine received:  []  No [x]  Yes Date of any COVID positive Test in last 90 days:  PCP -  Nani Gasser, MD Cardiologist -  Oncology- Arlan Organ, MD  Chest x-ray - 01-01-2022   2v   Epic EKG -  05-03-2022  Epic Stress Test -  ECHO - 11-24-2021  Epic Cardiac Cath -   PCR screen: []  Ordered & Completed           [x]   No Order but Needs PROFEND           []   N/A for this surgery  Surgery Plan:  []  Ambulatory                            []  Outpatient in bed                            [x]  Admit  Anesthesia:    [x]  General  []  Spinal                           []   Choice []   MAC  Bowel Prep - [x]  No  []   Yes ______  Pacemaker / ICD device [x]  No []  Yes   Spinal Cord Stimulator:[x]  No []  Yes       History of Sleep Apnea? [x]  No []  Yes   CPAP used?- [x]  No []  Yes    Does the patient monitor blood sugar?          []  No []  Yes  [x]  N/A  Patient has: [x]  NO Hx DM   []  Pre-DM                 []  DM1  []   DM2 Does patient have a [employer] or [employer]? []  No []  Yes   Fasting Blood Sugar Ranges-  Checks Blood Sugar _____ times a day  Blood Thinner / Instructions: none Aspirin Instructions: none  ERAS Protocol Ordered: []  No  [x]  Yes PRE-SURGERY []  ENSURE  []  G2  [x]  No Drink Ordered Patient is to be NPO after: 09:00 am  Comments:   Activity level: Patient is able / unable to climb a flight of stairs without difficulty; []  No CP  []  No SOB, but would have ___   Patient can / can not perform ADLs without assistance.   Anesthesia review:HTN, Anemia, Bradycardia, Raynaud's Disease, PONV, Current Chemo tx, S/p [name]- TAH/BSO,    Patient denies shortness of breath, fever, cough and chest pain at PAT appointment.  Patient verbalized understanding and agreement to the Pre-Surgical Instructions that were given to them at this PAT appointment. Patient was also educated of the need to review these PAT instructions again prior to her surgery.I reviewed the  appropriate phone numbers to call if they have any and questions or concerns.

## 2022-08-30 ENCOUNTER — Encounter (HOSPITAL_COMMUNITY)
Admission: RE | Admit: 2022-08-30 | Discharge: 2022-08-30 | Disposition: A | Discharge status: Home / Self Care | Payer: Medicare Other | Source: Ambulatory Visit | Attending: Gynecologic Oncology | Admitting: Gynecologic Oncology

## 2022-08-30 ENCOUNTER — Other Ambulatory Visit: Payer: Self-pay

## 2022-08-30 ENCOUNTER — Encounter (HOSPITAL_COMMUNITY): Payer: Self-pay | Admitting: *Deleted

## 2022-08-30 DIAGNOSIS — K589 Irritable bowel syndrome without diarrhea: Secondary | ICD-10-CM | POA: Diagnosis not present

## 2022-08-30 DIAGNOSIS — C569 Malignant neoplasm of unspecified ovary: Secondary | ICD-10-CM | POA: Insufficient documentation

## 2022-08-30 DIAGNOSIS — I10 Essential (primary) hypertension: Secondary | ICD-10-CM | POA: Insufficient documentation

## 2022-08-30 DIAGNOSIS — Z79899 Other long term (current) drug therapy: Secondary | ICD-10-CM | POA: Diagnosis not present

## 2022-08-30 DIAGNOSIS — Z932 Ileostomy status: Secondary | ICD-10-CM | POA: Diagnosis not present

## 2022-08-30 DIAGNOSIS — K219 Gastro-esophageal reflux disease without esophagitis: Secondary | ICD-10-CM | POA: Insufficient documentation

## 2022-08-30 DIAGNOSIS — I700 Atherosclerosis of aorta: Secondary | ICD-10-CM | POA: Diagnosis not present

## 2022-08-30 DIAGNOSIS — Z01818 Encounter for other preprocedural examination: Secondary | ICD-10-CM | POA: Diagnosis not present

## 2022-08-30 HISTORY — DX: Myoneural disorder, unspecified: G70.9

## 2022-08-30 HISTORY — DX: Gastro-esophageal reflux disease without esophagitis: K21.9

## 2022-08-30 LAB — COMPREHENSIVE METABOLIC PANEL
ALT: 44 U/L (ref 0–44)
AST: 29 U/L (ref 15–41)
Albumin: 4.1 g/dL (ref 3.5–5.0)
Alkaline Phosphatase: 103 U/L (ref 38–126)
Anion gap: 9 (ref 5–15)
BUN: 29 mg/dL — ABNORMAL HIGH (ref 8–23)
CO2: 24 mmol/L (ref 22–32)
Calcium: 9.8 mg/dL (ref 8.9–10.3)
Chloride: 105 mmol/L (ref 98–111)
Creatinine, Ser: 1.09 mg/dL — ABNORMAL HIGH (ref 0.44–1.00)
GFR, Estimated: 54 mL/min — ABNORMAL LOW (ref >60)
Glucose, Bld: 99 mg/dL (ref 70–99)
Potassium: 3.5 mmol/L (ref 3.5–5.1)
Sodium: 138 mmol/L (ref 135–145)
Total Bilirubin: 1.1 mg/dL (ref 0.3–1.2)
Total Protein: 6.9 g/dL (ref 6.5–8.1)

## 2022-08-30 LAB — CBC WITH DIFFERENTIAL/PLATELET
Abs Immature Granulocytes: 0.02 10*3/uL (ref 0.00–0.07)
Basophils Absolute: 0 10*3/uL (ref 0.0–0.1)
Basophils Relative: 1 %
Eosinophils Absolute: 0.1 10*3/uL (ref 0.0–0.5)
Eosinophils Relative: 2 %
HCT: 34.6 % — ABNORMAL LOW (ref 36.0–46.0)
Hemoglobin: 11.1 g/dL — ABNORMAL LOW (ref 12.0–15.0)
Immature Granulocytes: 0 %
Lymphocytes Relative: 22 %
Lymphs Abs: 1.2 10*3/uL (ref 0.7–4.0)
MCH: 34.4 pg — ABNORMAL HIGH (ref 26.0–34.0)
MCHC: 32.1 g/dL (ref 30.0–36.0)
MCV: 107.1 fL — ABNORMAL HIGH (ref 80.0–100.0)
Monocytes Absolute: 0.5 10*3/uL (ref 0.1–1.0)
Monocytes Relative: 9 %
Neutro Abs: 3.8 10*3/uL (ref 1.7–7.7)
Neutrophils Relative %: 66 %
Platelets: 196 10*3/uL (ref 150–400)
RBC: 3.23 MIL/uL — ABNORMAL LOW (ref 3.87–5.11)
RDW: 13 % (ref 11.5–15.5)
WBC: 5.7 10*3/uL (ref 4.0–10.5)
nRBC: 0 % (ref 0.0–0.2)

## 2022-08-30 LAB — TYPE AND SCREEN

## 2022-08-31 NOTE — Progress Notes (Signed)
Case: [id number] Date/Time: 09/07/22 1145   Procedure: ILEOSTOMY TAKEDOWN   Anesthesia type: General   Pre-op diagnosis: OVARIAN CANCER, ILEOSTOMY IN PLACE   [facility] RO[facility] 05 / [facility]   Surgeons: Carver Fila, MD       DISCUSSION: Maria Romero is a 72 year old female presenting to PAT prior to surgery listed above. Patient was diagnosed with ovarian cancer in August 2023.  She has been following with oncology and was started on Chemo in August 2023. Had hysterectomy with bilateral oophorectomy, tumor resection, and diverting ileostomy creation on 04/20/22 with Dr.Tucker. She is now in remission.  Other PMH significant for former smoking, HTN, GERD, aortic atherosclerosis  Prior anesthesia complications include PONV.  VS: BP 107/63 Comment: right arm sitting  Pulse 68   Temp 36.6 C (Oral)   Resp 14   Ht 5\' 3"  (1.6 m)   Wt 49.4 kg   SpO2 100%   BMI 19.31 kg/m   PROVIDERS: Agapito Games, MD Oncology- Arlan Organ, MD   LABS: Labs reviewed: Acceptable for surgery. (all labs ordered are listed, but only abnormal results are displayed)  Labs Reviewed  CBC WITH DIFFERENTIAL/PLATELET - Abnormal; Notable for the following components:      Result Value   RBC 3.23 (*)    Hemoglobin 11.1 (*)    HCT 34.6 (*)    MCV 107.1 (*)    MCH 34.4 (*)    All other components within normal limits  COMPREHENSIVE METABOLIC PANEL - Abnormal; Notable for the following components:   BUN 29 (*)    Creatinine, Ser 1.09 (*)    GFR, Estimated 54 (*)    All other components within normal limits  TYPE AND SCREEN     IMAGES:  CT chest/abdomen/pelvis 03/08/22:  IMPRESSION: 1. Reduction in the omental caking of tumor compared to 11/15/2021, although substantial tumor remains. 2. The right adnexal mass has a volume of 220 cubic cm and appears to be primarily solid, although components may be hemorrhagic. Previous ascites has convinced into a loculated collection of  fluid surrounding this adnexal mass and measuring 1400 cc. 3. Questionable bowel wall thickening in the transverse colon, although much of the appearance may be due to nondistention. Correlate with any signs of colitis. 4. Previous right pleural effusion has resolved. Residual small left pleural effusion is nonspecific for transudative or exudative etiology. 5. Other imaging findings of potential clinical significance: Substantial left anterior descending coronary artery atherosclerosis. Grade 1 degenerative anterolisthesis L4-5. Degenerative facet arthropathy at L4-5 and L5-S1. Old healed right posterior rib fractures. Small calcifications along the pleural margin of the left hemidiaphragm, possibly from prior asbestos exposure or prior pleurodesis. 6. Aortic atherosclerosis.   Aortic Atherosclerosis (ICD10-I70.0).   EKG 05/03/22:  NSR with PACS No significant change since last tracing   CV:  Echo 11/24/21:  IMPRESSIONS     1. A technically difficult study. Left ventricular ejection fraction, by  estimation, is 60 to 65%. The left ventricle has normal function. The left  ventricle has no regional wall motion abnormalities. Left ventricular  diastolic parameters are consistent   with Grade I diastolic dysfunction (impaired relaxation).   2. Right ventricular systolic function is normal. The right ventricular  size is normal.   3. The mitral valve is normal in structure. No evidence of mitral valve  regurgitation. No evidence of mitral stenosis.   4. The aortic valve is normal in structure. Aortic valve regurgitation is  not visualized. No aortic stenosis is  present.   5. The inferior vena cava is normal in size with greater than 50%  respiratory variability, suggesting right atrial pressure of 3 mmHg.   Past Medical History:  Diagnosis Date   Anemia    Complication of anesthesia    Dyspnea    GERD (gastroesophageal reflux disease)    Goals of care,  counseling/discussion 12/01/2021   Hypercholesterolemia    Hypertension    IBS (irritable bowel syndrome)    Ileostomy in place [facility])    Malignant ascites 11/19/2021   Neuromuscular disorder (HCC)    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   Osteopenia    Osteoporosis    [name], right (HCC) 12/01/2021   Ovarian mass, right 11/19/2021   PONV (postoperative nausea and vomiting)    Toe fracture    lt small toe    Past Surgical History:  Procedure Laterality Date   BOWEL RESECTION N/A 04/20/2022   Procedure: BOWEL RESECTION; DIVERTING ILEOSTOMY;  Surgeon: Carver Fila, MD;  Location[facility];  Service: Gynecology;  Laterality: N/A;   DEBULKING N/A 04/20/2022   Procedure: TUMOR DEBULKING, OMENTECTOMY;  Surgeon: Carver Fila, MD;  Location[facility];  Service: Gynecology;  Laterality: N/A;   HYSTERECTOMY ABDOMINAL WITH SALPINGO-OOPHORECTOMY Bilateral 04/20/2022   Procedure: HYSTERECTOMY ABDOMINAL WITH BILATERAL SALPINGO-OOPHORECTOMY;  Surgeon: Carver Fila, MD;  [facility];  Service: Gynecology;  Laterality: Bilateral;   IR IMAGING GUIDED PORT INSERTION  11/26/2021   IR PARACENTESIS  11/23/2021   IR PARACENTESIS  11/26/2021   LAPAROSCOPY N/A 04/20/2022   Procedure: LAPAROSCOPY DIAGNOSTIC,CYSTO;  Surgeon: Carver Fila, MD;  Location[facility];  Service: Gynecology;  Laterality: N/A;   lump removal  04/11/1993   axilla right - benign   TONSILLECTOMY     TUBAL LIGATION  04/11/1988    MEDICATIONS:  amLODipine (NORVASC) 5 MG tablet   atorvastatin (LIPITOR) 40 MG tablet   Biotin 16109 MCG TABS   Calcium-Vitamin D-Vitamin K (VIACTIV PO)   cholecalciferol (VITAMIN D3) 25 MCG (1000 UNIT) tablet   lidocaine-prilocaine (EMLA) cream   loperamide (IMODIUM A-D) 2 MG tablet   magnesium oxide (MAG-OX) 400 (240 Mg) MG tablet   omeprazole (PRILOSEC) 20 MG capsule   traMADol (ULTRAM) 50 MG tablet   TYLENOL 500 MG tablet   No current facility-administered medications for this  encounter.   Marcille Blanco [facility] Short Stay/Anesthesiology [facility] Phone [phone] 08/31/2022 3:17 PM

## 2022-08-31 NOTE — Anesthesia Preprocedure Evaluation (Addendum)
Anesthesia Evaluation  Patient identified by MRN, date of birth, ID band Patient awake    Reviewed: Allergy & Precautions, H&P , NPO status , Patient's Chart, lab work & pertinent test results  History of Anesthesia Complications (+) PONV and history of anesthetic complications  Airway Mallampati: II  TM Distance: >3 FB Neck ROM: Full    Dental no notable dental hx. (+) Dental Advisory Given   Pulmonary former smoker   Pulmonary exam normal        Cardiovascular hypertension, Pt. on medications Normal cardiovascular exam  Echo 11/24/21:  IMPRESSIONS     1. A technically difficult study. Left ventricular ejection fraction, by  estimation, is 60 to 65%. The left ventricle has normal function. The left  ventricle has no regional wall motion abnormalities. Left ventricular  diastolic parameters are consistent   with Grade I diastolic dysfunction (impaired relaxation).   2. Right ventricular systolic function is normal. The right ventricular  size is normal.   3. The mitral valve is normal in structure. No evidence of mitral valve  regurgitation. No evidence of mitral stenosis.   4. The aortic valve is normal in structure. Aortic valve regurgitation is  not visualized. No aortic stenosis is present.   5. The inferior vena cava is normal in size with greater than 50%  respiratory variability, suggesting right atrial pressure of 3 mmHg.     Neuro/Psych negative neurological ROS  negative psych ROS   GI/Hepatic Neg liver ROS,GERD  Medicated and Controlled,,IBS   Endo/Other  negative endocrine ROS    Renal/GU ARFRenal disease  negative genitourinary   Musculoskeletal negative musculoskeletal ROS (+)    Abdominal   Peds negative pediatric ROS (+)  Hematology  (+) Blood dyscrasia, anemia   Anesthesia Other Findings   Reproductive/Obstetrics negative OB ROS                              Anesthesia Physical Anesthesia Plan  ASA: 3  Anesthesia Plan: General   Post-op Pain Management: Tylenol PO (pre-op)*, Toradol IV (intra-op)* and Lidocaine infusion*   Induction:   PONV Risk Score and Plan: 4 or greater and Ondansetron, Dexamethasone, Treatment may vary due to age or medical condition, TIVA and Diphenhydramine  Airway Management Planned: Oral ETT  Additional Equipment: None  Intra-op Plan:   Post-operative Plan: Extubation in OR  Informed Consent: I have reviewed the patients History and Physical, chart, labs and discussed the procedure including the risks, benefits and alternatives for the proposed anesthesia with the patient or authorized representative who has indicated his/her understanding and acceptance.     Dental advisory given  Plan Discussed with: Anesthesiologist and CRNA  Anesthesia Plan Comments: (See PAT note from 5/21 by Sherlie Ban PA-C)       Anesthesia Quick Evaluation

## 2022-09-01 ENCOUNTER — Encounter: Payer: Self-pay | Admitting: Gynecologic Oncology

## 2022-09-06 ENCOUNTER — Telehealth: Payer: Self-pay | Admitting: *Deleted

## 2022-09-06 NOTE — Telephone Encounter (Signed)
Attempted to reach Ms. Hafler for her pre-op call. Left voice mail to call the office at [phone].

## 2022-09-06 NOTE — Telephone Encounter (Signed)
Maria Romero returned office call to check on pre-operative status.  Patient compliant with pre-operative instructions.  Reinforced nothing to eat after midnight. Clear liquids until 0645. Patient to arrive at 0745.  No questions or concerns voiced.  Instructed to call for any needs.

## 2022-09-07 ENCOUNTER — Encounter (HOSPITAL_COMMUNITY)
Admission: RE | Disposition: A | Discharge status: Home / Self Care | Payer: Self-pay | Source: Home / Self Care | Attending: Gynecologic Oncology

## 2022-09-07 ENCOUNTER — Encounter: Payer: Self-pay | Admitting: *Deleted

## 2022-09-07 ENCOUNTER — Inpatient Hospital Stay (HOSPITAL_COMMUNITY): Payer: Medicare Other | Admitting: Anesthesiology

## 2022-09-07 ENCOUNTER — Inpatient Hospital Stay (HOSPITAL_COMMUNITY): Payer: Medicare Other | Admitting: Medical

## 2022-09-07 ENCOUNTER — Other Ambulatory Visit: Payer: Self-pay

## 2022-09-07 ENCOUNTER — Inpatient Hospital Stay (HOSPITAL_COMMUNITY)
Admission: RE | Admit: 2022-09-07 | Discharge: 2022-09-11 | DRG: 330 | Disposition: A | Discharge status: Home / Self Care | Payer: Medicare Other | Attending: Gynecologic Oncology | Admitting: Gynecologic Oncology

## 2022-09-07 ENCOUNTER — Encounter (HOSPITAL_COMMUNITY): Payer: Self-pay | Admitting: Gynecologic Oncology

## 2022-09-07 DIAGNOSIS — R001 Bradycardia, unspecified: Secondary | ICD-10-CM | POA: Diagnosis not present

## 2022-09-07 DIAGNOSIS — D649 Anemia, unspecified: Secondary | ICD-10-CM | POA: Diagnosis not present

## 2022-09-07 DIAGNOSIS — Z87891 Personal history of nicotine dependence: Secondary | ICD-10-CM

## 2022-09-07 DIAGNOSIS — K9419 Other complications of enterostomy: Secondary | ICD-10-CM | POA: Diagnosis not present

## 2022-09-07 DIAGNOSIS — M810 Age-related osteoporosis without current pathological fracture: Secondary | ICD-10-CM | POA: Diagnosis not present

## 2022-09-07 DIAGNOSIS — E7800 Pure hypercholesterolemia, unspecified: Secondary | ICD-10-CM | POA: Diagnosis present

## 2022-09-07 DIAGNOSIS — Z432 Encounter for attention to ileostomy: Secondary | ICD-10-CM | POA: Diagnosis not present

## 2022-09-07 DIAGNOSIS — Z803 Family history of malignant neoplasm of breast: Secondary | ICD-10-CM

## 2022-09-07 DIAGNOSIS — C561 Malignant neoplasm of right ovary: Secondary | ICD-10-CM | POA: Diagnosis present

## 2022-09-07 DIAGNOSIS — Z823 Family history of stroke: Secondary | ICD-10-CM

## 2022-09-07 DIAGNOSIS — Z79899 Other long term (current) drug therapy: Secondary | ICD-10-CM

## 2022-09-07 DIAGNOSIS — E876 Hypokalemia: Secondary | ICD-10-CM | POA: Diagnosis present

## 2022-09-07 DIAGNOSIS — Z8249 Family history of ischemic heart disease and other diseases of the circulatory system: Secondary | ICD-10-CM

## 2022-09-07 DIAGNOSIS — Z90710 Acquired absence of both cervix and uterus: Secondary | ICD-10-CM | POA: Diagnosis not present

## 2022-09-07 DIAGNOSIS — C786 Secondary malignant neoplasm of retroperitoneum and peritoneum: Secondary | ICD-10-CM | POA: Diagnosis not present

## 2022-09-07 DIAGNOSIS — K219 Gastro-esophageal reflux disease without esophagitis: Secondary | ICD-10-CM | POA: Diagnosis not present

## 2022-09-07 DIAGNOSIS — I10 Essential (primary) hypertension: Secondary | ICD-10-CM

## 2022-09-07 DIAGNOSIS — Z83438 Family history of other disorder of lipoprotein metabolism and other lipidemia: Secondary | ICD-10-CM | POA: Diagnosis not present

## 2022-09-07 DIAGNOSIS — Z9221 Personal history of antineoplastic chemotherapy: Secondary | ICD-10-CM

## 2022-09-07 DIAGNOSIS — I959 Hypotension, unspecified: Secondary | ICD-10-CM | POA: Diagnosis not present

## 2022-09-07 DIAGNOSIS — Z932 Ileostomy status: Secondary | ICD-10-CM | POA: Diagnosis not present

## 2022-09-07 DIAGNOSIS — Z933 Colostomy status: Secondary | ICD-10-CM | POA: Diagnosis not present

## 2022-09-07 DIAGNOSIS — Z833 Family history of diabetes mellitus: Secondary | ICD-10-CM

## 2022-09-07 DIAGNOSIS — D62 Acute posthemorrhagic anemia: Secondary | ICD-10-CM | POA: Diagnosis not present

## 2022-09-07 DIAGNOSIS — T148XXA Other injury of unspecified body region, initial encounter: Secondary | ICD-10-CM

## 2022-09-07 HISTORY — DX: Ileostomy status: Z93.2

## 2022-09-07 HISTORY — PX: ILEOSTOMY CLOSURE: SHX1784

## 2022-09-07 LAB — TYPE AND SCREEN
ABO/RH(D): A NEG
Antibody Screen: NEGATIVE

## 2022-09-07 SURGERY — CLOSURE, ILEOSTOMY
Anesthesia: General

## 2022-09-07 MED ORDER — OXYCODONE HCL 5 MG PO TABS
5.0000 mg | ORAL_TABLET | ORAL | Status: DC | PRN
  Filled 2022-09-07: qty 1

## 2022-09-07 MED ORDER — FENTANYL CITRATE (PF) 100 MCG/2ML IJ SOLN
INTRAMUSCULAR | Status: AC
  Filled 2022-09-07: qty 2

## 2022-09-07 MED ORDER — FENTANYL CITRATE PF 50 MCG/ML IJ SOSY
PREFILLED_SYRINGE | INTRAMUSCULAR | Status: AC
  Filled 2022-09-07: qty 1

## 2022-09-07 MED ORDER — DIPHENHYDRAMINE HCL 50 MG/ML IJ SOLN
INTRAMUSCULAR | Status: DC | PRN
  Administered 2022-09-07: 6.25 mg via INTRAVENOUS

## 2022-09-07 MED ORDER — PHENYLEPHRINE HCL (PRESSORS) 10 MG/ML IV SOLN
INTRAVENOUS | Status: AC
  Filled 2022-09-07: qty 1

## 2022-09-07 MED ORDER — KETOROLAC TROMETHAMINE 30 MG/ML IJ SOLN
INTRAMUSCULAR | Status: DC | PRN
  Administered 2022-09-07: 15 mg via INTRAVENOUS

## 2022-09-07 MED ORDER — BUPIVACAINE LIPOSOME 1.3 % IJ SUSP
INTRAMUSCULAR | Status: DC | PRN
  Administered 2022-09-07: 20 mL

## 2022-09-07 MED ORDER — CEFAZOLIN SODIUM-DEXTROSE 2-4 GM/100ML-% IV SOLN
2.0000 g | INTRAVENOUS | Status: AC
  Administered 2022-09-07: 2 g via INTRAVENOUS
  Filled 2022-09-07: qty 100

## 2022-09-07 MED ORDER — AMISULPRIDE (ANTIEMETIC) 5 MG/2ML IV SOLN: 10.0000 mg | Freq: Once | INTRAVENOUS | Status: DC | PRN

## 2022-09-07 MED ORDER — SUGAMMADEX SODIUM 200 MG/2ML IV SOLN
INTRAVENOUS | Status: DC | PRN
  Administered 2022-09-07: 200 mg via INTRAVENOUS

## 2022-09-07 MED ORDER — HEPARIN SODIUM (PORCINE) 5000 UNIT/ML IJ SOLN
5000.0000 [IU] | INTRAMUSCULAR | Status: AC
  Administered 2022-09-07: 5000 [IU] via SUBCUTANEOUS
  Filled 2022-09-07: qty 1

## 2022-09-07 MED ORDER — PROPOFOL 10 MG/ML IV BOLUS
INTRAVENOUS | Status: AC
  Filled 2022-09-07: qty 20

## 2022-09-07 MED ORDER — FENTANYL CITRATE (PF) 100 MCG/2ML IJ SOLN
INTRAMUSCULAR | Status: DC | PRN
  Administered 2022-09-07: 100 ug via INTRAVENOUS

## 2022-09-07 MED ORDER — METRONIDAZOLE 500 MG/100ML IV SOLN
500.0000 mg | INTRAVENOUS | Status: AC
  Administered 2022-09-07: 500 mg via INTRAVENOUS
  Filled 2022-09-07: qty 100

## 2022-09-07 MED ORDER — ATORVASTATIN CALCIUM 20 MG PO TABS
40.0000 mg | ORAL_TABLET | Freq: Every day | ORAL | Status: DC
  Administered 2022-09-07 – 2022-09-10 (×4): 40 mg via ORAL
  Filled 2022-09-07 (×4): qty 2

## 2022-09-07 MED ORDER — TRAMADOL HCL 50 MG PO TABS
100.0000 mg | ORAL_TABLET | Freq: Two times a day (BID) | ORAL | Status: DC | PRN
  Administered 2022-09-08: 50 mg via ORAL
  Administered 2022-09-09: 100 mg via ORAL
  Administered 2022-09-09: 50 mg via ORAL
  Administered 2022-09-10 (×2): 100 mg via ORAL
  Filled 2022-09-07 (×5): qty 2

## 2022-09-07 MED ORDER — PANTOPRAZOLE SODIUM 40 MG PO TBEC
40.0000 mg | DELAYED_RELEASE_TABLET | Freq: Every day | ORAL | Status: DC
  Administered 2022-09-08 – 2022-09-11 (×4): 40 mg via ORAL
  Filled 2022-09-07 (×4): qty 1

## 2022-09-07 MED ORDER — ACETAMINOPHEN 500 MG PO TABS
1000.0000 mg | ORAL_TABLET | ORAL | Status: AC
  Administered 2022-09-07: 1000 mg via ORAL
  Filled 2022-09-07: qty 2

## 2022-09-07 MED ORDER — ONDANSETRON HCL 4 MG PO TABS: 4.0000 mg | ORAL_TABLET | Freq: Four times a day (QID) | ORAL | Status: DC | PRN

## 2022-09-07 MED ORDER — SENNOSIDES-DOCUSATE SODIUM 8.6-50 MG PO TABS: 1.0000 | ORAL_TABLET | Freq: Every evening | ORAL | Status: DC | PRN

## 2022-09-07 MED ORDER — ROCURONIUM BROMIDE 10 MG/ML (PF) SYRINGE
PREFILLED_SYRINGE | INTRAVENOUS | Status: AC
  Filled 2022-09-07: qty 10

## 2022-09-07 MED ORDER — ROCURONIUM BROMIDE 10 MG/ML (PF) SYRINGE
PREFILLED_SYRINGE | INTRAVENOUS | Status: DC | PRN
  Administered 2022-09-07: 60 mg via INTRAVENOUS

## 2022-09-07 MED ORDER — PHENYLEPHRINE HCL-NACL 20-0.9 MG/250ML-% IV SOLN
INTRAVENOUS | Status: DC | PRN
  Administered 2022-09-07: 30 ug/min via INTRAVENOUS

## 2022-09-07 MED ORDER — KCL IN DEXTROSE-NACL 20-5-0.45 MEQ/L-%-% IV SOLN
INTRAVENOUS | Status: DC
  Filled 2022-09-07 (×2): qty 1000

## 2022-09-07 MED ORDER — BISACODYL 5 MG PO TBEC: 5.0000 mg | DELAYED_RELEASE_TABLET | Freq: Every day | ORAL | Status: DC | PRN

## 2022-09-07 MED ORDER — AMLODIPINE BESYLATE 5 MG PO TABS
5.0000 mg | ORAL_TABLET | Freq: Every day | ORAL | Status: DC
  Filled 2022-09-07: qty 1

## 2022-09-07 MED ORDER — SUCCINYLCHOLINE CHLORIDE 200 MG/10ML IV SOSY
PREFILLED_SYRINGE | INTRAVENOUS | Status: AC
  Filled 2022-09-07: qty 10

## 2022-09-07 MED ORDER — DEXAMETHASONE SODIUM PHOSPHATE 4 MG/ML IJ SOLN
4.0000 mg | INTRAMUSCULAR | Status: AC
  Administered 2022-09-07: 4 mg via INTRAVENOUS

## 2022-09-07 MED ORDER — ENOXAPARIN SODIUM 40 MG/0.4ML IJ SOSY
40.0000 mg | PREFILLED_SYRINGE | INTRAMUSCULAR | Status: DC
  Administered 2022-09-08 – 2022-09-11 (×4): 40 mg via SUBCUTANEOUS
  Filled 2022-09-07 (×4): qty 0.4

## 2022-09-07 MED ORDER — PROPOFOL 10 MG/ML IV BOLUS
INTRAVENOUS | Status: DC | PRN
  Administered 2022-09-07: 150 ug/kg/min via INTRAVENOUS
  Administered 2022-09-07: 100 mg via INTRAVENOUS

## 2022-09-07 MED ORDER — FENTANYL CITRATE PF 50 MCG/ML IJ SOSY
25.0000 ug | PREFILLED_SYRINGE | INTRAMUSCULAR | Status: DC | PRN
  Administered 2022-09-07: 50 ug via INTRAVENOUS

## 2022-09-07 MED ORDER — CHEWING GUM (ORBIT) SUGAR FREE
1.0000 | CHEWING_GUM | Freq: Three times a day (TID) | ORAL | Status: DC
  Administered 2022-09-07 – 2022-09-11 (×12): 1 via ORAL
  Filled 2022-09-07: qty 1

## 2022-09-07 MED ORDER — DEXAMETHASONE SODIUM PHOSPHATE 10 MG/ML IJ SOLN
INTRAMUSCULAR | Status: AC
  Filled 2022-09-07: qty 1

## 2022-09-07 MED ORDER — ONDANSETRON HCL 4 MG/2ML IJ SOLN
INTRAMUSCULAR | Status: DC | PRN
  Administered 2022-09-07: 4 mg via INTRAVENOUS

## 2022-09-07 MED ORDER — LIDOCAINE 2% (20 MG/ML) 5 ML SYRINGE
INTRAMUSCULAR | Status: DC | PRN
  Administered 2022-09-07: 40 mg via INTRAVENOUS

## 2022-09-07 MED ORDER — LACTATED RINGERS IV SOLN: INTRAVENOUS | Status: DC

## 2022-09-07 MED ORDER — ACETAMINOPHEN 500 MG PO TABS: 1000.0000 mg | ORAL_TABLET | Freq: Once | ORAL | Status: DC

## 2022-09-07 MED ORDER — 0.9 % SODIUM CHLORIDE (POUR BTL) OPTIME
TOPICAL | Status: DC | PRN
  Administered 2022-09-07: 2000 mL

## 2022-09-07 MED ORDER — ONDANSETRON HCL 4 MG/2ML IJ SOLN: 4.0000 mg | Freq: Four times a day (QID) | INTRAMUSCULAR | Status: DC | PRN

## 2022-09-07 MED ORDER — ACETAMINOPHEN 500 MG PO TABS
1000.0000 mg | ORAL_TABLET | Freq: Two times a day (BID) | ORAL | Status: DC
  Administered 2022-09-07 – 2022-09-11 (×8): 1000 mg via ORAL
  Filled 2022-09-07 (×8): qty 2

## 2022-09-07 MED ORDER — BUPIVACAINE HCL 0.25 % IJ SOLN
INTRAMUSCULAR | Status: DC | PRN
  Administered 2022-09-07: 10 mL

## 2022-09-07 MED ORDER — PROMETHAZINE HCL 25 MG/ML IJ SOLN: 6.2500 mg | INTRAMUSCULAR | Status: DC | PRN

## 2022-09-07 MED ORDER — CHLORHEXIDINE GLUCONATE 0.12 % MT SOLN
15.0000 mL | Freq: Once | OROMUCOSAL | Status: AC
  Administered 2022-09-07: 15 mL via OROMUCOSAL

## 2022-09-07 MED ORDER — BUPIVACAINE HCL 0.25 % IJ SOLN
INTRAMUSCULAR | Status: AC
  Filled 2022-09-07: qty 1

## 2022-09-07 MED ORDER — BUPIVACAINE LIPOSOME 1.3 % IJ SUSP
INTRAMUSCULAR | Status: AC
  Filled 2022-09-07: qty 20

## 2022-09-07 MED ORDER — ORAL CARE MOUTH RINSE: 15.0000 mL | Freq: Once | OROMUCOSAL | Status: AC

## 2022-09-07 SURGICAL SUPPLY — 53 items
APL PRP STRL LF DISP 70% ISPRP (MISCELLANEOUS) ×1
BAG COUNTER SPONGE SURGICOUNT (BAG) IMPLANT
BAG SPNG CNTER NS LX DISP (BAG)
BLADE HEX COATED 2.75 (ELECTRODE) ×1 IMPLANT
CHLORAPREP W/TINT 26 (MISCELLANEOUS) ×1 IMPLANT
COVER MAYO STAND STRL (DRAPES) ×1 IMPLANT
DRAPE LAPAROSCOPIC ABDOMINAL (DRAPES) ×1 IMPLANT
DRAPE UTILITY XL STRL (DRAPES) IMPLANT
DRAPE WARM FLUID 44X44 (DRAPES) ×1 IMPLANT
DRSG OPSITE POSTOP 4X10 (GAUZE/BANDAGES/DRESSINGS) IMPLANT
DRSG OPSITE POSTOP 4X6 (GAUZE/BANDAGES/DRESSINGS) IMPLANT
DRSG OPSITE POSTOP 4X8 (GAUZE/BANDAGES/DRESSINGS) IMPLANT
DRSG TEGADERM 4X4.75 (GAUZE/BANDAGES/DRESSINGS) IMPLANT
DRSG TELFA 3X8 NADH STRL (GAUZE/BANDAGES/DRESSINGS) IMPLANT
ELECT REM PT RETURN 15FT ADLT (MISCELLANEOUS) ×1 IMPLANT
GAUZE PACKING IODOFORM 1/4X15 (PACKING) IMPLANT
GAUZE SPONGE 2X2 8PLY STRL LF (GAUZE/BANDAGES/DRESSINGS) IMPLANT
GAUZE SPONGE 4X4 12PLY STRL (GAUZE/BANDAGES/DRESSINGS) ×1 IMPLANT
GLOVE BIO SURGEON STRL SZ 6.5 (GLOVE) ×2 IMPLANT
GLOVE BIOGEL PI IND STRL 7.0 (GLOVE) ×1 IMPLANT
GLOVE INDICATOR 6.5 STRL GRN (GLOVE) ×1 IMPLANT
GOWN STRL REUS W/ TWL XL LVL3 (GOWN DISPOSABLE) ×2 IMPLANT
GOWN STRL REUS W/TWL XL LVL3 (GOWN DISPOSABLE) ×2
HANDLE SUCTION POOLE (INSTRUMENTS) ×1 IMPLANT
HOLDER FOLEY CATH W/STRAP (MISCELLANEOUS) IMPLANT
KIT BASIN OR (CUSTOM PROCEDURE TRAY) ×1 IMPLANT
KIT TURNOVER KIT A (KITS) IMPLANT
LIGASURE IMPACT 36 18CM CVD LR (INSTRUMENTS) IMPLANT
MANIFOLD NEPTUNE II (INSTRUMENTS) ×1 IMPLANT
PACK GENERAL/GYN (CUSTOM PROCEDURE TRAY) ×1 IMPLANT
RELOAD PROXIMATE 75MM BLUE (ENDOMECHANICALS) ×2 IMPLANT
RELOAD STAPLE 75 3.8 BLU REG (ENDOMECHANICALS) IMPLANT
STAPLER GUN LINEAR PROX 60 (STAPLE) IMPLANT
STAPLER PROXIMATE 75MM BLUE (STAPLE) IMPLANT
SUCTION POOLE HANDLE (INSTRUMENTS)
SUT NOVA NAB DX-16 0-1 5-0 T12 (SUTURE) ×1 IMPLANT
SUT NOVA NAB GS-21 0 18 T12 DT (SUTURE) IMPLANT
SUT PDS AB 1 TP1 96 (SUTURE) IMPLANT
SUT PROLENE 2 0 BLUE (SUTURE) IMPLANT
SUT SILK 2 0 (SUTURE)
SUT SILK 2 0 SH (SUTURE) IMPLANT
SUT SILK 2 0 SH CR/8 (SUTURE) ×1 IMPLANT
SUT SILK 2-0 18XBRD TIE 12 (SUTURE) ×1 IMPLANT
SUT SILK 3 0 (SUTURE)
SUT SILK 3 0 SH CR/8 (SUTURE) ×1 IMPLANT
SUT SILK 3-0 18XBRD TIE 12 (SUTURE) ×1 IMPLANT
SUT VIC AB 2-0 SH 18 (SUTURE) IMPLANT
SUT VIC AB 2-0 SH 27 (SUTURE) ×7
SUT VIC AB 2-0 SH 27X BRD (SUTURE) ×2 IMPLANT
SUT VIC AB 4-0 PS2 18 (SUTURE) ×1 IMPLANT
TOWEL OR 17X26 10 PK STRL BLUE (TOWEL DISPOSABLE) ×2 IMPLANT
TOWEL OR NON WOVEN STRL DISP B (DISPOSABLE) ×2 IMPLANT
YANKAUER SUCT BULB TIP NO VENT (SUCTIONS) ×1 IMPLANT

## 2022-09-07 NOTE — Transfer of Care (Signed)
Immediate Anesthesia Transfer of Care Note  Patient: Maria Romero  Procedure(s) Performed: ILEOSTOMY TAKEDOWN  Patient Location: PACU  Anesthesia Type:General  Level of Consciousness: awake, alert , and patient cooperative  Airway & Oxygen Therapy: Patient Spontanous Breathing and Patient connected to face mask oxygen  Post-op Assessment: Report given to RN and Post -op Vital signs reviewed and stable  Post vital signs: Reviewed and stable  Last Vitals:  Vitals Value Taken Time  BP 125/66 09/07/22 1136  Temp    Pulse 67 09/07/22 1142  Resp 17 09/07/22 1142  SpO2 100 % 09/07/22 1142  Vitals shown include unvalidated device data.  Last Pain:  Vitals:   09/07/22 0817  TempSrc:   PainSc: 0-No pain         Complications: No notable events documented.

## 2022-09-07 NOTE — Interval H&P Note (Signed)
History and Physical Interval Note:  09/07/2022 9:23 AM  Maria Romero  has presented today for surgery, with the diagnosis of OVARIAN CANCER, ILEOSTOMY IN PLACE.  The various methods of treatment have been discussed with the patient and family. After consideration of risks, benefits and other options for treatment, the patient has consented to  Procedure(s): ILEOSTOMY TAKEDOWN (N/A) as a surgical intervention.  The patient's history has been reviewed, patient examined, no change in status, stable for surgery.  I have reviewed the patient's chart and labs.  Questions were answered to the patient's satisfaction.     Carver Fila

## 2022-09-07 NOTE — Progress Notes (Signed)
Patient had her ileostomy reversal today with surgical biopsies. Will follow for path.   Oncology Nurse Navigator Documentation     09/07/2022   12:30 PM  Oncology Nurse Navigator Flowsheets  Navigator Follow Up Date: 09/12/2022  Navigator Follow Up Reason: Pathology  Navigator Location [facility]  Navigator Encounter Type Appt/Treatment Plan Review  Patient Visit Type MedOnc  Treatment Phase Active Tx  Barriers/Navigation Needs Coordination of Care  Interventions None Required  Acuity Level 2-Minimal Needs (1-2 Barriers Identified)  Support Groups/Services Friends and Family  Time Spent with Patient 15

## 2022-09-07 NOTE — Anesthesia Postprocedure Evaluation (Signed)
Anesthesia Post Note  Patient: Maria Romero  Procedure(s) Performed: ILEOSTOMY TAKEDOWN     Patient location during evaluation: PACU Anesthesia Type: General Level of consciousness: sedated Pain management: pain level controlled Vital Signs Assessment: post-procedure vital signs reviewed and stable Respiratory status: spontaneous breathing and respiratory function stable Cardiovascular status: stable Postop Assessment: no apparent nausea or vomiting Anesthetic complications: no  No notable events documented.  Last Vitals:  Vitals:   09/07/22 1200 09/07/22 1215  BP: 116/65 126/72  Pulse: 63 61  Resp: 11 11  Temp:    SpO2: 95% 100%    Last Pain:  Vitals:   09/07/22 1200  TempSrc:   PainSc: 0-No pain                 Akhila Mahnken DANIEL

## 2022-09-07 NOTE — Op Note (Signed)
OPERATIVE NOTE  Pre-operative Diagnosis: Ileostomy, planned reversal   Post-operative Diagnosis: same as above   Operation: Ileostomy takedown   Surgeon: Eugene Garnet MD   Assistant Surgeon: Antionette Char MD (an MD assistant was necessary for tissue manipulation, management of robotic instrumentation, retraction and positioning due to the complexity of the case and hospital policies).    Anesthesia: GET   Urine Output: minimal   Operative Findings: Normal-appearing loop ileostomy.  Some filmy adhesions around the loop of ileum itself and its mesentery.  Fascia easily identified and normal in appearance.  Ileum freed from surrounding anterior abdominal wall peritoneum without difficulty. Miliary serosal implants, scar/adhesion vs disease (biopsies taken).   Estimated Blood Loss: 50 cc       Total IV Fluids: see I&O flowsheet         Specimens: proximal and distal ends of the ileostomy, small bowel serosal implants         Complications:  None apparent; patient tolerated the procedure well.         Disposition: PACU - hemodynamically stable.   Procedure Details  The patient was seen in the Holding Room. The risks, benefits, complications, treatment options, and expected outcomes were discussed with the patient.  The patient concurred with the proposed plan, giving informed consent.  The site of surgery properly noted/marked. The patient was identified as Mickie Bail and the procedure verified as an ileostomy reversal.    After induction of anesthesia, the urethra with prepped with Betadine and a Foley catheter was placed.  2 interrupted sutures were used to close the afferent and efferent ends of the ileostomy.  The patient was draped and prepped in the usual sterile manner.  The patient's abdomen was prepped with ChloraPrep and then she was draped after the prep had been allowed to dry for 3 minutes.  The stoma itself was cleansed with Betadine.  A Time Out was held and the  above information confirmed.   A circular incision was made using the scalpel followed by Bovie monopolar electrocautery around the skin of the ostomy site.  Next, the subcutaneous tissue was carefully dissected down to the bowel into the underlying fascia.  This was done with a combination of electrocautery, Metzenbaum scissors, and blunt dissection.  Once to the interface of the bowel and the fascia, scalpel was used to incise the anterior fascia.  The interface between the bowel and the peritoneum was then easily identified and entered sharply.  Remaining filmy attachments between the bowel and the fascia were undermined with the finger and taken down with the Bovie until the bowel and mesentery were completely freed.   The ostomy was gently elevated upwards.  Using a hemostat, a defect was created in the mesentery of the ileum below the level of the ileostomy. GIA staplers were then used to staple and transect the ileum, removing the prior ileostomy.  Ligasure was used to cauterize and transect the mesentery to free the ileostomy. This was passed off the field for permanent pathology.  The suture lines were then excised and the open lumens were suspended upwards with Allis clamps.  3 stay sutures using 2-0 Vicryl were used to approximate antimesenteric surfaces of the ileum to each other.  A GIA staple load was then used to create a side-to-side anastomosis between the distal and proximal ends of the ileum.  Care was taken to ensure that the bowel walls were oriented antimesenteric to antimesenteric before deployment staple load.  Next, the TA stapler  was brought onto the field and the staple load was fired across the top of the anastomosis.  Metzenbaum scissors were used to remove the small excess tissue above the stapler.  The lumen of the side-to-side anastomosis was sufficient with approximately 3-4 cm width.  The small defect in the mesentery was closed with interrupted sutures of 2-0 Vicryl and the  staple line itself was oversewn with interrupted 2-0 Vicryl.  Meticulous hemostasis was noted.  The bowel was then gently replaced into the abdominal cavity and noted to be free of any tension.   The fascia was closed with #1 looped PDS suture in a running fashion after the fascia was identified; knot was placed at the superior aspect of the incision.  The subcutaneous tissue was irrigated copiously and tissue made hemostatic.  Exparel was injected for local anesthesia.  Next, 2-0 Vicryl was used to reapproximate the subcutaneous tissue in pursestring fashion.  This was done in 3 layers.  Iodoform packing gauze and a 2 x 2 were then used to cover the opened incision followed by a Tegaderm.   Foley catheter was removed.  All sponge, lap and needle counts were correct x  3.    The patient was transferred to the recovery room in stable condition.  Eugene Garnet MD Gynecologic Oncology

## 2022-09-07 NOTE — Anesthesia Procedure Notes (Signed)
Procedure Name: Intubation Date/Time: 09/07/2022 10:06 AM  Performed by: Ponciano Ort, CRNAPre-anesthesia Checklist: Patient identified, Emergency Drugs available, Suction available and Patient being monitored Patient Re-evaluated:Patient Re-evaluated prior to induction Oxygen Delivery Method: Circle system utilized Preoxygenation: Pre-oxygenation with 100% oxygen Induction Type: IV induction Ventilation: Mask ventilation without difficulty Laryngoscope Size: Miller and 2 (with some trachea pressure.) Grade View: Grade I Tube type: Oral Tube size: 7.0 mm Number of attempts: 1 Airway Equipment and Method: Stylet and Oral airway Placement Confirmation: ETT inserted through vocal cords under direct vision, positive ETCO2 and breath sounds checked- equal and bilateral Secured at: 20 cm Tube secured with: Tape Dental Injury: Teeth and Oropharynx as per pre-operative assessment

## 2022-09-08 ENCOUNTER — Encounter (HOSPITAL_COMMUNITY): Payer: Self-pay | Admitting: Gynecologic Oncology

## 2022-09-08 LAB — CBC
HCT: 29 % — ABNORMAL LOW (ref 36.0–46.0)
Hemoglobin: 9.6 g/dL — ABNORMAL LOW (ref 12.0–15.0)
MCH: 34.5 pg — ABNORMAL HIGH (ref 26.0–34.0)
MCHC: 33.1 g/dL (ref 30.0–36.0)
MCV: 104.3 fL — ABNORMAL HIGH (ref 80.0–100.0)
Platelets: 157 10*3/uL (ref 150–400)
RBC: 2.78 MIL/uL — ABNORMAL LOW (ref 3.87–5.11)
RDW: 12.3 % (ref 11.5–15.5)
WBC: 7.9 10*3/uL (ref 4.0–10.5)
nRBC: 0 % (ref 0.0–0.2)

## 2022-09-08 LAB — BASIC METABOLIC PANEL
Anion gap: 6 (ref 5–15)
BUN: 17 mg/dL (ref 8–23)
CO2: 22 mmol/L (ref 22–32)
Calcium: 9 mg/dL (ref 8.9–10.3)
Chloride: 108 mmol/L (ref 98–111)
Creatinine, Ser: 0.93 mg/dL (ref 0.44–1.00)
GFR, Estimated: >60 mL/min (ref >60)
Glucose, Bld: 107 mg/dL — ABNORMAL HIGH (ref 70–99)
Potassium: 3.9 mmol/L (ref 3.5–5.1)
Sodium: 136 mmol/L (ref 135–145)

## 2022-09-08 LAB — HEMOGLOBIN AND HEMATOCRIT, BLOOD
HCT: 28.9 % — ABNORMAL LOW (ref 36.0–46.0)
Hemoglobin: 9.3 g/dL — ABNORMAL LOW (ref 12.0–15.0)

## 2022-09-08 LAB — SURGICAL PATHOLOGY

## 2022-09-08 MED ORDER — CHLORHEXIDINE GLUCONATE CLOTH 2 % EX PADS
6.0000 | MEDICATED_PAD | Freq: Every day | CUTANEOUS | Status: DC
  Administered 2022-09-08 – 2022-09-11 (×4): 6 via TOPICAL

## 2022-09-08 MED ORDER — SODIUM CHLORIDE 0.9% FLUSH: 10.0000 mL | INTRAVENOUS | Status: DC | PRN

## 2022-09-08 MED ORDER — SILVER NITRATE-POT NITRATE 75-25 % EX MISC
1.0000 | CUTANEOUS | Status: DC | PRN
  Filled 2022-09-08: qty 1

## 2022-09-08 MED ORDER — SODIUM CHLORIDE 0.9% FLUSH
10.0000 mL | Freq: Two times a day (BID) | INTRAVENOUS | Status: DC
  Administered 2022-09-08: 10 mL

## 2022-09-08 NOTE — Progress Notes (Signed)
GYN Oncology Progress Note  1 Day Post-Op Procedure(s) (LRB): ILEOSTOMY TAKEDOWN (N/A)  Subjective: Patient reports doing well except her heart rate will not "come up." She is voiding without difficulty. Has passed flatus. No nausea or emesis reported. Tolerating liquids. No lightheadedness or dizziness reported. Feels steady when ambulating. Pain is manageable. SCDs are helping with neuropathy symptoms. No concerns voiced.    Objective: Vital signs in last 24 hours: Temp:  [96.1 F (35.6 C)-97.8 F (36.6 C)] 97.6 F (36.4 C) (05/30 0641) Pulse Rate:  [51-75] 56 (05/30 0641) Resp:  [11-22] 18 (05/30 0641) BP: (100-126)/(59-72) 113/59 (05/30 0641) SpO2:  [95 %-100 %] 96 % (05/30 0641) Weight:  [109 lb (49.4 kg)] 109 lb (49.4 kg) (05/29 0800) Last BM Date : 09/06/22  Intake/Output from previous day: 05/29 0701 - 05/30 0700 In: 2701.3 [P.O.:720; I.V.:1881.3; IV Piggyback:100] Out: 1600 [Urine:1600]  Physical Examination: General: alert, cooperative, and no distress Resp: clear to auscultation bilaterally Cardio: regular rate and rhythm, S1, S2 normal, no murmur, click, rub or gallop GI: soft, non-tender; bowel sounds normal; no masses,  no organomegaly and incision: right abdominal dressing stained with serosanguinous drainage, pt for dsg changed this am. Extremities: extremities normal, atraumatic, no cyanosis or edema SCDs on  Labs: WBC/Hgb/Hct/Plts:  7.9/9.6/29.0/157 (05/30 0352) BUN/Cr/glu/ALT/AST/amyl/lip:  17/0.93/--/--/--/--/-- (05/30 1610)  Assessment: 72 y.o. s/p Procedure(s): ILEOSTOMY TAKEDOWN: stable Pain:  Pain is well-controlled on PRN medications.  Heme: Hgb 9.6 and Hct 29.0 this am. Plan for repeat labs in the am. EBL 50 cc. Drop  in hgb/hct felt to be most likely related to hemodilution.   ID: WBC 7.9 this am. Given decadron intra-op along with IV abx.  CV: Mildy bradycardic and hypotensive. Asymptomatic. Pt reports having this in the past. Norvasc placed  on hold.  GI:  Tolerating po: Yes. Antiemetics ordered if needed. Given flatus, plan for diet advancement.   GU: Creatinine 0.93 this am. Adequate output recorded.    FEN: No critical values on am labs.  Prophylaxis: SCDs on. Lovenox ordered.  Plan: Encourage increasing ambulation IV to saline lock Diet to soft diet Dressing changes Continue plan of care per Dr. Pricilla Holm   LOS: 1 day    Doylene Bode 09/08/2022, 7:42 AM

## 2022-09-08 NOTE — TOC CM/SW Note (Signed)
Transition of Care [facility]) - Inpatient Brief Assessment   Patient Details  Name: Maria Romero MRN: [id number] Date of Birth: 05/19/1950  Transition of [facility]) CM/SW Contact:    Coralyn Helling, LCSW Phone Number: 09/08/2022, 9:06 AM   Clinical Narrative Patient screened. No needs anticipated at the time of screening. Please submit TOC consult if new needs arise.     Transition of Care Asessment: Insurance and Status: Insurance coverage has been reviewed Patient has primary care physician: Yes Home environment has been reviewed: From hoe alone Prior level of function:: Indeendent Prior/Current Home Services: No current home services Social Determinants of Health Reivew: SDOH reviewed no interventions necessary Readmission risk has been reviewed: Yes Transition of care needs: no transition of care needs at this time

## 2022-09-08 NOTE — Progress Notes (Signed)
Mobility Specialist - Progress Note   09/08/22 0906  Mobility  Activity Ambulated independently in hallway  Level of Assistance Independent  Assistive Device None  Distance Ambulated (ft) 500 ft  Activity Response Tolerated well  Mobility Referral Yes  $Mobility charge 1 Mobility  Mobility Specialist Start Time (ACUTE ONLY) [id number]  Mobility Specialist Stop Time (ACUTE ONLY) 0905  Mobility Specialist Time Calculation (min) (ACUTE ONLY) 7 min   Pt received in bed and agreeable to mobility. No complaints during session. Pt to bed after session with all needs met.    During mobility: 52 HR   [facility] Paediatric nurse

## 2022-09-08 NOTE — Progress Notes (Addendum)
GYN Oncology Progress Note  To the room to assess prior ileostomy site. With dressing change this am, moderate amount of bright red bleeding reported per RN. Bleeding subsided with re-packing and pressure.   Wound bed assessed. Silver nitrate applied to 3 areas of potential cause of bleeding with no active bleeding noted at this time. Wound repacked with iodoform gauze. Pt tolerated this well.   Pt reports having a loose bowel movement with small amount of bright red bleeding. Advised to monitor this. She is doing well with no concerns voiced. Continue with plan of care. Repeat H&H ordered to assess for stability.

## 2022-09-09 LAB — BASIC METABOLIC PANEL
Anion gap: 6 (ref 5–15)
BUN: 16 mg/dL (ref 8–23)
CO2: 24 mmol/L (ref 22–32)
Calcium: 8.3 mg/dL — ABNORMAL LOW (ref 8.9–10.3)
Chloride: 109 mmol/L (ref 98–111)
Creatinine, Ser: 0.89 mg/dL (ref 0.44–1.00)
GFR, Estimated: >60 mL/min (ref >60)
Glucose, Bld: 88 mg/dL (ref 70–99)
Potassium: 3.4 mmol/L — ABNORMAL LOW (ref 3.5–5.1)
Sodium: 139 mmol/L (ref 135–145)

## 2022-09-09 LAB — CBC
HCT: 28.4 % — ABNORMAL LOW (ref 36.0–46.0)
Hemoglobin: 9.3 g/dL — ABNORMAL LOW (ref 12.0–15.0)
MCH: 34.6 pg — ABNORMAL HIGH (ref 26.0–34.0)
MCHC: 32.7 g/dL (ref 30.0–36.0)
MCV: 105.6 fL — ABNORMAL HIGH (ref 80.0–100.0)
Platelets: 145 10*3/uL — ABNORMAL LOW (ref 150–400)
RBC: 2.69 MIL/uL — ABNORMAL LOW (ref 3.87–5.11)
RDW: 12.6 % (ref 11.5–15.5)
WBC: 6.7 10*3/uL (ref 4.0–10.5)
nRBC: 0 % (ref 0.0–0.2)

## 2022-09-09 NOTE — Progress Notes (Signed)
Mobility Specialist - Progress Note   09/09/22 1425  Mobility  Activity Ambulated independently in hallway  Level of Assistance Independent  Assistive Device None  Distance Ambulated (ft) 230 ft  Activity Response Tolerated well  Mobility Referral Yes  $Mobility charge 1 Mobility  Mobility Specialist Start Time (ACUTE ONLY) 0215  Mobility Specialist Stop Time (ACUTE ONLY) 0221  Mobility Specialist Time Calculation (min) (ACUTE ONLY) 6 min   Pt received in recliner and agreeable to mobility. No complaints during session. Ambulation cut short due to pain. Pt to bed after session with all needs met.    [facility]

## 2022-09-09 NOTE — Progress Notes (Signed)
GYN Oncology Progress Note  Patient reports overall doing well.  She continues to have pain of the right side of her abdomen.  She is using the heating pad. She states she has ambulated in the halls, not as well as yesterday.  She did not eat much for breakfast due to having a decreased appetite.  She ate lunch with no nausea or vomiting.  She does report passing flatus.  She is voiding without difficulty. No concerns voiced. Continue plan of care. If pain improved and continuing to meet milestones, possible discharge tomorrow.

## 2022-09-09 NOTE — Progress Notes (Signed)
GYN Oncology Progress Note  2 Days Post-Op Procedure(s) (LRB): ILEOSTOMY TAKEDOWN (N/A)  Subjective: Patient reports more abdominal pain today that crosses the mid abdomen. No flatus today. Had small loose bloody BM yesterday. No nausea or emesis reported. Ambulating and sitting in the chair. Just received a tramadol for pain. No concerns voiced.    Objective: Vital signs in last 24 hours: Temp:  [97.5 F (36.4 C)-98.1 F (36.7 C)] 97.9 F (36.6 C) (05/31 0607) Pulse Rate:  [54-64] 58 (05/31 0607) Resp:  [15-18] 18 (05/31 0607) BP: (107-118)/(60-69) 118/63 (05/31 0607) SpO2:  [98 %-100 %] 98 % (05/31 0607) Last BM Date : 09/08/22  Intake/Output from previous day: 05/30 0701 - 05/31 0700 In: 900 [P.O.:900] Out: 1050 [Urine:1050]  Physical Examination: General: alert, cooperative, and no distress Resp: clear to auscultation bilaterally Cardio: regular rate and rhythm, S1, S2 normal, no murmur, click, rub or gallop GI: incision: right abdominal dressing dry and intact and abdomen tender on palpation, mildly hypoactive bowel sounds Extremities: extremities normal, atraumatic, no cyanosis or edema SCDs on  Labs: WBC/Hgb/Hct/Plts:  6.7/9.3/28.4/145 (05/31 1610) BUN/Cr/glu/ALT/AST/amyl/lip:  16/0.89/--/--/--/--/-- (05/31 9604)  Assessment: 72 y.o. s/p Procedure(s): ILEOSTOMY TAKEDOWN: stable, more abdominal pain reported today. Pain:  Pain is well-controlled on PRN medications. Kpad ordered. Pt waiting to see if recent tramadol use will help with pain.  Heme: Hgb 9.3 and Hct 28.4 this am from 9.6 and Hct 29.0 09/08/22 am. EBL 50 cc. Drop  in hgb/hct felt to be most likely related to hemodilution. Labs overall stable.   ID: WBC 6.7 this am from 7.9 09/08/22 am. Given decadron intra-op along with IV abx.  CV: Mildy bradycardic and mild hypotension improving. Asymptomatic. Pt reports having this in the past. Norvasc placed on hold.  GI:  Tolerating po: Yes. Antiemetics ordered if  needed. Diet on soft.   GU: Creatinine 0.89 this am from 0.93 5/30 am. Adequate output recorded.    FEN: No critical values on am labs.  Prophylaxis: SCDs on. Lovenox ordered.  Plan: Given increase in pain today, no flatus, continue to monitor Kpad Encourage increasing ambulation On soft diet Dressing changes Continue plan of care per Dr. Pricilla Holm When ready for discharge, pt will be going home   LOS: 2 days    Doylene Bode 09/09/2022, 8:18 AM

## 2022-09-09 NOTE — Discharge Instructions (Signed)
AFTER SURGERY INSTRUCTIONS   Return to work: 4-6 weeks if applicable  Plan on changing the dressing on your abdomen once daily.    Activity: 1. Be up and out of the bed during the day.  Take a nap if needed.  You may walk up steps but be careful and use the hand rail.  Stair climbing will tire you more than you think, you may need to stop part way and rest.    2. No lifting or straining for 6 weeks over 10 pounds. No pushing, pulling, straining for 6 weeks.   3. No driving for around 1 week(s).  Do not drive if you are taking narcotic pain medicine and make sure that your reaction time has returned.    4. You can shower as soon as the next day after surgery. Shower daily.  Use your regular soap and water (not directly on the incision) and pat your incision(s) dry afterwards; don't rub.  No tub baths or submerging your body in water until cleared by your surgeon. If you have the soap that was given to you by pre-surgical testing that was used before surgery, you do not need to use it afterwards because this can irritate your incisions.    5. You may experience a small amount of clear drainage from your incision, which is normal.  If the drainage persists, increases, or changes color please call the office.   6. Do not use creams, lotions, or ointments such as neosporin on your incision after surgery until advised by your surgeon.   7. Take Tylenol first for pain if you are able to take these medication and only use narcotic pain medication for severe pain not relieved by the Tylenol.  Monitor your Tylenol intake to a max of 4,000 mg in a 24 hour period.    Diet: 1. Low sodium Heart Healthy Diet is recommended but you are cleared to resume your normal (before surgery) diet after your procedure.   Wound Care: 1. Keep clean and dry.  Shower daily.   Reasons to call the Doctor: Fever - Oral temperature greater than 100.4 degrees Fahrenheit Foul-smelling vaginal discharge Difficulty  urinating Nausea and vomiting Increased pain at the site of the incision that is unrelieved with pain medicine. Difficulty breathing with or without chest pain New calf pain especially if only on one side Sudden, continuing increased vaginal bleeding with or without clots.   Contacts: For questions or concerns you should contact:   Dr. Eugene Garnet at [phone]   Warner Mccreedy, NP at [phone]   After Hours: call [phone] and have the GYN Oncologist paged/contacted (after 5 pm or on the weekends). You will speak with an after hours RN and let he or she know you have had surgery.   Messages sent via mychart are for non-urgent matters and are not responded to after hours so for urgent needs, please call the after hours number.

## 2022-09-09 NOTE — Progress Notes (Signed)
GYN Oncology Progress Note  No urine documented overnight from 22:00. Patient states she voided several times overnight and feels she is emptying her bladder well. No needs voiced at this time.

## 2022-09-10 LAB — BASIC METABOLIC PANEL
Anion gap: 6 (ref 5–15)
BUN: 18 mg/dL (ref 8–23)
CO2: 27 mmol/L (ref 22–32)
Calcium: 8.2 mg/dL — ABNORMAL LOW (ref 8.9–10.3)
Chloride: 105 mmol/L (ref 98–111)
Creatinine, Ser: 0.93 mg/dL (ref 0.44–1.00)
GFR, Estimated: >60 mL/min (ref >60)
Glucose, Bld: 94 mg/dL (ref 70–99)
Potassium: 3.3 mmol/L — ABNORMAL LOW (ref 3.5–5.1)
Sodium: 138 mmol/L (ref 135–145)

## 2022-09-10 LAB — CBC
HCT: 28.7 % — ABNORMAL LOW (ref 36.0–46.0)
Hemoglobin: 9.2 g/dL — ABNORMAL LOW (ref 12.0–15.0)
MCH: 34.1 pg — ABNORMAL HIGH (ref 26.0–34.0)
MCHC: 32.1 g/dL (ref 30.0–36.0)
MCV: 106.3 fL — ABNORMAL HIGH (ref 80.0–100.0)
Platelets: 154 10*3/uL (ref 150–400)
RBC: 2.7 MIL/uL — ABNORMAL LOW (ref 3.87–5.11)
RDW: 12.7 % (ref 11.5–15.5)
WBC: 5.2 10*3/uL (ref 4.0–10.5)
nRBC: 0 % (ref 0.0–0.2)

## 2022-09-10 LAB — MAGNESIUM: Magnesium: 1 mg/dL — ABNORMAL LOW (ref 1.7–2.4)

## 2022-09-10 MED ORDER — POTASSIUM CHLORIDE CRYS ER 20 MEQ PO TBCR
20.0000 meq | EXTENDED_RELEASE_TABLET | Freq: Once | ORAL | Status: AC
  Administered 2022-09-10: 20 meq via ORAL
  Filled 2022-09-10: qty 1

## 2022-09-10 MED ORDER — MAGNESIUM CHLORIDE 64 MG PO TBEC
2.0000 | DELAYED_RELEASE_TABLET | Freq: Every day | ORAL | Status: DC
  Administered 2022-09-10: 128 mg via ORAL
  Filled 2022-09-10 (×3): qty 2

## 2022-09-10 NOTE — Progress Notes (Signed)
Mobility Specialist - Progress Note   09/10/22 1025  Mobility  Activity Ambulated independently in hallway;Ambulated independently to bathroom  Level of Assistance Independent after set-up  Assistive Device None  Distance Ambulated (ft) 400 ft  Range of Motion/Exercises Active  Activity Response Tolerated well  Mobility Referral Yes  $Mobility charge 1 Mobility  Mobility Specialist Start Time (ACUTE ONLY) [id number]  Mobility Specialist Stop Time (ACUTE ONLY) 0934  Mobility Specialist Time Calculation (min) (ACUTE ONLY) 9 min   Pt received in bed and agreeable to mobilize. Pt reported 7/10 pain in abdomen, but reported pain settled during ambulation. No other complaints made. Pt returned to room and was left in recliner with call bell at side and NT in room.   Arliss Journey Mobility Specialist Acute Rehabilitation Services Phone: [phone] 09/10/22, 10:28 AM

## 2022-09-10 NOTE — Progress Notes (Signed)
3 Days Post-Op Procedure(s) (LRB): ILEOSTOMY TAKEDOWN (N/A)  Subjective: Patient reports doing well.  Still having some right lower quadrant pain but improved from yesterday.  Taking tramadol with the Tylenol helps.  Was able to sleep overnight.  Continues to endorse flatus, no bowel movement yesterday.  Tolerating p.o. without nausea or emesis.  Ambulating frequently.  Objective: Vital signs in last 24 hours: Temp:  [98 F (36.7 C)-98.5 F (36.9 C)] 98 F (36.7 C) (06/01 0537) Pulse Rate:  [55-66] 58 (06/01 0537) Resp:  [16-18] 16 (06/01 0537) BP: (109-124)/(55-64) 124/63 (06/01 0537) SpO2:  [96 %-100 %] 96 % (06/01 0537) Last BM Date : 09/08/22  Intake/Output from previous day: 05/31 0701 - 06/01 0700 In: 960 [P.O.:960] Out: 1350 [Urine:1350]  Physical Examination: General: No acute distress, alert and oriented HEENT: Atraumatic, normocephalic, anicteric sclera Cardiovascular: Heart rate low 60s, regular rhythm, no murmurs or rubs Pulmonary: Lungs are clear to auscultation bilaterally, no wheezes or rhonchi Abdomen: Soft, nondistended, nontender to palpation, + bowel sounds, incision is dressed without surrounding erythema or induration Extremities: Warm and well-perfused, no edema  Labs:    Latest Ref Rng & Units 09/10/2022    5:30 AM 09/09/2022    3:06 AM 09/08/2022   10:37 AM  CBC  WBC 4.0 - 10.5 K/uL 5.2  6.7    Hemoglobin 12.0 - 15.0 g/dL 9.2  9.3  9.3   Hematocrit 36.0 - 46.0 % 28.7  28.4  28.9   Platelets 150 - 400 K/uL 154  145        Latest Ref Rng & Units 09/10/2022    5:30 AM 09/09/2022    3:06 AM 09/08/2022    3:52 AM  BMP  Glucose 70 - 99 mg/dL 94  88  161   BUN 8 - 23 mg/dL 18  16  17    Creatinine 0.44 - 1.00 mg/dL 0.96  0.45  4.09   Sodium 135 - 145 mmol/L 138  139  136   Potassium 3.5 - 5.1 mmol/L 3.3  3.4  3.9   Chloride 98 - 111 mmol/L 105  109  108   CO2 22 - 32 mmol/L 27  24  22    Calcium 8.9 - 10.3 mg/dL 8.2  8.3  9.0    Assessment:  72 y.o.  s/p Procedure(s): ILEOSTOMY TAKEDOWN: Progressing well  Postop: Improved pain control today, continue oral pain medication regimen.  Having bowel function.  Voiding without difficulty.  Heme: Acute on chronic anemia secondary to surgical blood loss.  Asymptomatic.  H&H stable.  Continue to monitor.  Hypokalemia: repletion ordered.  Cardiovascular: Mild bradycardia, asymptomatic.  Continue to monitor.  Prophylaxis: SCDs, lovenox, ambulation.   Plan: Anticipate discharge later today versus tomorrow. The patient is to be discharged to home.   LOS: 3 days    Carver Fila 09/10/2022, 7:05 AM

## 2022-09-11 LAB — CBC
HCT: 28 % — ABNORMAL LOW (ref 36.0–46.0)
Hemoglobin: 8.8 g/dL — ABNORMAL LOW (ref 12.0–15.0)
MCH: 33.5 pg (ref 26.0–34.0)
MCHC: 31.4 g/dL (ref 30.0–36.0)
MCV: 106.5 fL — ABNORMAL HIGH (ref 80.0–100.0)
Platelets: 150 10*3/uL (ref 150–400)
RBC: 2.63 MIL/uL — ABNORMAL LOW (ref 3.87–5.11)
RDW: 12.6 % (ref 11.5–15.5)
WBC: 4.6 10*3/uL (ref 4.0–10.5)
nRBC: 0 % (ref 0.0–0.2)

## 2022-09-11 LAB — BASIC METABOLIC PANEL
Anion gap: 5 (ref 5–15)
BUN: 19 mg/dL (ref 8–23)
CO2: 27 mmol/L (ref 22–32)
Calcium: 8.4 mg/dL — ABNORMAL LOW (ref 8.9–10.3)
Chloride: 107 mmol/L (ref 98–111)
Creatinine, Ser: 0.85 mg/dL (ref 0.44–1.00)
GFR, Estimated: >60 mL/min (ref >60)
Glucose, Bld: 94 mg/dL (ref 70–99)
Potassium: 3.7 mmol/L (ref 3.5–5.1)
Sodium: 139 mmol/L (ref 135–145)

## 2022-09-11 LAB — MAGNESIUM: Magnesium: 1 mg/dL — ABNORMAL LOW (ref 1.7–2.4)

## 2022-09-11 MED ORDER — PSYLLIUM 95 % PO PACK
1.0000 | PACK | Freq: Every day | ORAL | Status: DC
  Administered 2022-09-11: 1 via ORAL
  Filled 2022-09-11: qty 1

## 2022-09-11 MED ORDER — MAGNESIUM OXIDE -MG SUPPLEMENT 400 (240 MG) MG PO TABS
400.0000 mg | ORAL_TABLET | Freq: Every day | ORAL | Status: DC
Start: 2022-09-11 — End: 2022-09-11

## 2022-09-11 MED ORDER — HEPARIN SOD (PORK) LOCK FLUSH 100 UNIT/ML IV SOLN
500.0000 [IU] | INTRAVENOUS | Status: AC | PRN
  Administered 2022-09-11: 500 [IU]
  Filled 2022-09-11: qty 5

## 2022-09-11 MED ORDER — MAGNESIUM SULFATE 4 GM/100ML IV SOLN
4.0000 g | Freq: Once | INTRAVENOUS | Status: AC
  Administered 2022-09-11: 4 g via INTRAVENOUS
  Filled 2022-09-11: qty 100

## 2022-09-11 MED ORDER — MAGNESIUM CHLORIDE 64 MG PO TBEC
2.0000 | DELAYED_RELEASE_TABLET | Freq: Every day | ORAL | Status: DC
Start: 2022-09-11 — End: 2022-09-11

## 2022-09-11 NOTE — Progress Notes (Signed)
4 Days Post-Op Procedure(s) (LRB): ILEOSTOMY TAKEDOWN (N/A)  Subjective: Patient reports doing well. Continued flatus. No BM. Voiding freely. Ambulating. Denies nausea or emesis. Had some increased pain yesterday morning, pain well controlled since.  Objective: Vital signs in last 24 hours: Temp:  [97.7 F (36.5 C)-98.2 F (36.8 C)] 98.1 F (36.7 C) (06/02 0519) Pulse Rate:  [61-66] 63 (06/02 0519) Resp:  [14-17] 17 (06/02 0519) BP: (120-134)/(62-72) 126/62 (06/02 0519) SpO2:  [96 %-100 %] 96 % (06/02 0519) Last BM Date : 09/08/22  Intake/Output from previous day: 06/01 0701 - 06/02 0700 In: 1540 [P.O.:1540] Out: 1850 [Urine:1850]  Physical Examination: General: No acute distress, alert and oriented HEENT: Atraumatic, normocephalic, anicteric sclera Cardiovascular: Regular rate and rhythm, no murmurs or rubs Pulmonary: Lungs are clear to auscultation bilaterally, no wheezes or rhonchi Abdomen: Soft, nondistended, nontender to palpation, normal bowel sounds, incision is dressed without surrounding erythema or induration Extremities: Warm and well-perfused, no edema  Labs:    Latest Ref Rng & Units 09/11/2022    3:20 AM 09/10/2022    5:30 AM 09/09/2022    3:06 AM  CBC  WBC 4.0 - 10.5 K/uL 4.6  5.2  6.7   Hemoglobin 12.0 - 15.0 g/dL 8.8  9.2  9.3   Hematocrit 36.0 - 46.0 % 28.0  28.7  28.4   Platelets 150 - 400 K/uL 150  154  145       Latest Ref Rng & Units 09/11/2022    3:20 AM 09/10/2022    5:30 AM 09/09/2022    3:06 AM  BMP  Glucose 70 - 99 mg/dL 94  94  88   BUN 8 - 23 mg/dL 19  18  16    Creatinine 0.44 - 1.00 mg/dL 1.61  0.96  0.45   Sodium 135 - 145 mmol/L 139  138  139   Potassium 3.5 - 5.1 mmol/L 3.7  3.3  3.4   Chloride 98 - 111 mmol/L 107  105  109   CO2 22 - 32 mmol/L 27  27  24    Calcium 8.9 - 10.3 mg/dL 8.4  8.2  8.3    Assessment:  72 y.o. s/p Procedure(s): ILEOSTOMY TAKEDOWN: Progressing well   Postop: Improved pain control today, continue oral pain  medication regimen.  Having bowel function.  Voiding without difficulty.   Heme: Acute on chronic anemia secondary to surgical blood loss.  Asymptomatic.  H&H stable.  Continue to monitor.   Hypomagnesemia: repletion ordered. Will continue repletion at home.   Cardiovascular: Bradycardia improved.   Prophylaxis: SCDs, lovenox, ambulation.   Plan: Anticipate discharge later today. The patient is to be discharged to home.   LOS: 4 days    Carver Fila 09/11/2022, 7:40 AM

## 2022-09-11 NOTE — Discharge Summary (Signed)
Physician Discharge Summary  Patient ID: Maria Romero MRN: [id number] DOB/AGE: May 23, 1950 72 y.o.  Admit date: 09/07/2022 Discharge date: 09/11/2022  Admission Diagnoses: Ileostomy present [facility])  Discharge Diagnoses:  Principal Problem:   Ileostomy present [facility]) Active Problems:   Ileostomy in place [facility])   Hypomagnesemia   Discharged Condition:  The patient is in good condition and stable for discharge.    Hospital Course: The patient was admitted on 5/29 for ileostomy reversal in the setting of prior diverting ileostomy at the time of her interval debulking surgery for metastatic HGS ovarian cancer. Preoperative imaging was negative for evidence of metastatic disease and and rectosigmoid anastomosis appeared intact.   The patient did well postop and was ready for discharge on POD#4. At the time of discharge, she was tolerating PO, voiding freely, reporting returning of bowel function and ambulating well. Pain was controlled with PO medications. She is discharged home in stable condition.   Consults: none  Significant Diagnostic Studies:     Latest Ref Rng & Units 09/11/2022    3:20 AM 09/10/2022    5:30 AM 09/09/2022    3:06 AM  CBC  WBC 4.0 - 10.5 K/uL 4.6  5.2  6.7   Hemoglobin 12.0 - 15.0 g/dL 8.8  9.2  9.3   Hematocrit 36.0 - 46.0 % 28.0  28.7  28.4   Platelets 150 - 400 K/uL 150  154  145       Latest Ref Rng & Units 09/11/2022    3:20 AM 09/10/2022    5:30 AM 09/09/2022    3:06 AM  BMP  Glucose 70 - 99 mg/dL 94  94  88   BUN 8 - 23 mg/dL 19  18  16    Creatinine 0.44 - 1.00 mg/dL 3.08  6.57  8.46   Sodium 135 - 145 mmol/L 139  138  139   Potassium 3.5 - 5.1 mmol/L 3.7  3.3  3.4   Chloride 98 - 111 mmol/L 107  105  109   CO2 22 - 32 mmol/L 27  27  24    Calcium 8.9 - 10.3 mg/dL 8.4  8.2  8.3    Treatments: none  Discharge Exam: Blood pressure 116/67, pulse 63, temperature 98.4 F (36.9 C), temperature source Oral, resp. rate 18, height 5\' 3"  (1.6 m), weight 109  lb (49.4 kg), SpO2 100 %. General: No acute distress, alert and oriented HEENT: Atraumatic, normocephalic, anicteric sclera Cardiovascular: Heart rate low 60s, regular rhythm, no murmurs or rubs Pulmonary: Lungs are clear to auscultation bilaterally, no wheezes or rhonchi Abdomen: Soft, nondistended, nontender to palpation, + bowel sounds, incision is dressed without surrounding erythema or induration Extremities: Warm and well-perfused, no edema  Disposition: Discharge disposition: 01-Home or Self Care        Allergies as of 09/11/2022       Reactions   Fluvastatin Sodium Other (See Comments)   Myalgias   Sudafed [pseudoephedrine Hcl]    Makes her feel "high"        Medication List     STOP taking these medications    loperamide 2 MG tablet Commonly known as: IMODIUM A-D       TAKE these medications    amLODipine 5 MG tablet Commonly known as: NORVASC Take 5 mg by mouth daily.   atorvastatin 40 MG tablet Commonly known as: LIPITOR TAKE ONE TABLET BY MOUTH EVERY NIGHT AT BEDTIME   Biotin 96295 MCG Tabs Take 10,000 mcg by mouth daily.  cholecalciferol 25 MCG (1000 UNIT) tablet Commonly known as: VITAMIN D3 Take 1,000 Units by mouth daily.   lidocaine-prilocaine cream Commonly known as: EMLA Apply 1 Application topically as needed (for port access).   magnesium oxide 400 (240 Mg) MG tablet Commonly known as: MAG-OX Take 1 tablet (400 mg total) by mouth daily.   omeprazole 20 MG capsule Commonly known as: PRILOSEC Take 1 capsule (20 mg total) by mouth daily. What changed: when to take this   traMADol 50 MG tablet Commonly known as: ULTRAM Take 1 tablet (50 mg total) by mouth every 6 (six) hours as needed for severe pain. For AFTER surgery only, do not take and drive   TYLENOL 161 MG tablet Generic drug: acetaminophen Take 500-1,000 mg by mouth every 6 (six) hours as needed for mild pain or headache.   VIACTIV PO Take 1 tablet by mouth in the  morning and at bedtime.        Follow-up Information     Carver Fila, MD Follow up on 10/07/2022.   Specialty: Gynecologic Oncology Why: at 3:45pm at the [facility] for post-op check Contact information: [address] [place] [address] [phone]                 Greater than thirty minutes were spend for face to face discharge instructions and discharge orders/summary in EPIC.   Signed: Carver Fila 09/11/2022, 3:22 PM

## 2022-09-11 NOTE — Plan of Care (Signed)

## 2022-09-11 NOTE — Progress Notes (Signed)
Mobility Specialist - Progress Note   09/11/22 1020  Mobility  Activity Ambulated independently in hallway;Ambulated independently to bathroom  Level of Assistance Independent after set-up  Assistive Device Other (Comment) (IV Pole)  Distance Ambulated (ft) 450 ft  Range of Motion/Exercises Active  Activity Response Tolerated well  Mobility Referral Yes  $Mobility charge 1 Mobility  Mobility Specialist Start Time (ACUTE ONLY) 0950  Mobility Specialist Stop Time (ACUTE ONLY) 1003  Mobility Specialist Time Calculation (min) (ACUTE ONLY) 13 min   Pt in good spirits today and agreeable to mobilize. No complaints made during session. Pt returned to bed after activity and was left with call bell at side and RN in room.   Arliss Journey Mobility Specialist Acute Rehabilitation Services Phone: [phone] 09/11/22, 10:22 AM

## 2022-09-11 NOTE — Progress Notes (Signed)
Pt successfully changed abd dressing with good aseptic technique.

## 2022-09-12 ENCOUNTER — Telehealth: Payer: Self-pay | Admitting: *Deleted

## 2022-09-12 ENCOUNTER — Encounter: Payer: Self-pay | Admitting: *Deleted

## 2022-09-12 NOTE — Progress Notes (Signed)
Patient's path returned negative for any malignancy. She will need to follow up in about 3 weeks with Dr Myna Hidalgo. Message sent to scheduling.   Oncology Nurse Navigator Documentation     09/12/2022    1:45 PM  Oncology Nurse Navigator Flowsheets  Navigator Follow Up Date: 10/03/2022  Navigator Follow Up Reason: Follow-up Appointment  Navigator Location [facility]  Navigator Encounter Type Pathology Review  Patient Visit Type MedOnc  Treatment Phase Active Tx  Barriers/Navigation Needs Coordination of Care  Interventions Coordination of Care  Acuity Level 2-Minimal Needs (1-2 Barriers Identified)  Coordination of Care Appts  Support Groups/Services Friends and Family  Time Spent with Patient 15

## 2022-09-12 NOTE — Telephone Encounter (Signed)
Spoke with Ms. Maria Romero this morning. She states she is eating, drinking and urinating well. She has had a small BM and is passing gas. She is taking senokot as prescribed and encouraged her to drink plenty of water. She denies fever or chills. Incisions are dry and intact. She rates her pain 3/10. Her pain is controlled with tylenol.    Instructed to call office with any fever, chills, purulent drainage, uncontrolled pain or any other questions or concerns. Patient verbalizes understanding.   Pt aware of post op appointments as well as the office number [phone] and after hours number [phone] to call if she has any questions or concerns

## 2022-09-13 ENCOUNTER — Telehealth: Payer: Self-pay | Admitting: *Deleted

## 2022-09-13 ENCOUNTER — Encounter: Payer: Self-pay | Admitting: *Deleted

## 2022-09-13 NOTE — Transitions of Care (Post Inpatient/ED Visit) (Signed)
09/13/2022  Name: Maria Romero MRN: [id number] DOB: April 03, 1951  Today's TOC FU Call Status: Today's TOC FU Call Status:: Successful TOC FU Call Competed TOC FU Call Complete Date: 09/13/22  Transition Care Management Follow-up Telephone Call Date of Discharge: 09/11/22 Discharge Facility: [facility]) Type of Discharge: Inpatient Admission Primary Inpatient Discharge Diagnosis:: Ileostomy takedown from prior [name] surgery How have you been since you were released from the hospital?: Better ("I am doing much better and feeling fine; am back to my normal self; my daughter and family are checking on me frequently, but I am still independent and don't need much help.  I have had 2 normal BM's since I got home") Any questions or concerns?: No  Items Reviewed: Did you receive and understand the discharge instructions provided?: Yes (thoroughly reviewed with patient who verbalizes good understanding of same) Medications obtained,verified, and reconciled?: Yes (Medications Reviewed) (Full medication reconciliation/ review completed; no concerns or discrepancies identified; confirmed patient obtained/ is taking all newly Rx'd medications as instructed; self-manages medications and denies questions/ concerns around medications today) Any new allergies since your discharge?: No Dietary orders reviewed?: Yes Type of Diet Ordered:: low residue diet; advance diet gradually after recent surgery Do you have support at home?: Yes People in Home: alone Name of Support/Comfort Primary Source: Reports independent in self-care activities; supportive family assists as/ if needed/ indicated; resides alone  Medications Reviewed Today: Medications Reviewed Today     Reviewed by Michaela Corner, RN (Registered Nurse) on 09/13/22 at 1030  Med List Status: <None>   Medication Order Taking? Sig Documenting Provider Last Dose Status Informant  amLODipine (NORVASC) 5 MG tablet [id number] No Take 5 mg by  mouth daily.  Patient not taking: Reported on 09/13/2022   [provider] Not Taking Active Self           Med Note Michaela Corner   Tue Sep 13, 2022 10:29 AM) 09/13/22: reports during [facility] call "the doctor that handles my reynaulds disease told me to stop taking this for now"  not currently taking  atorvastatin (LIPITOR) 40 MG tablet [id number] No TAKE ONE TABLET BY MOUTH EVERY NIGHT AT BEDTIME  Patient not taking: Reported on 09/13/2022   Agapito Games, MD Not Taking Active Self           Med Note Michaela Corner   Tue Sep 13, 2022 10:30 AM) 09/13/22: Repports was instructed by Dr. Myna Hidalgo to stop taking "for now;" not currently taking  Biotin 08657 MCG TABS [id number] Yes Take 10,000 mcg by mouth daily. [provider] Taking Active Self  Calcium-Vitamin D-Vitamin K (VIACTIV PO) [id number] Yes Take 1 tablet by mouth in the morning and at bedtime. [provider] Taking Active Self  cholecalciferol (VITAMIN D3) 25 MCG (1000 UNIT) tablet [id number] Yes Take 1,000 Units by mouth daily. [provider] Taking Active Self  lidocaine-prilocaine (EMLA) cream [id number] Yes Apply 1 Application topically as needed (for port access). [provider] Taking Active Self  magnesium oxide (MAG-OX) 400 (240 Mg) MG tablet [id number] Yes Take 1 tablet (400 mg total) by mouth daily. Rai, Delene Ruffini, MD Taking Active Self  omeprazole (PRILOSEC) 20 MG capsule [id number] Yes Take 1 capsule (20 mg total) by mouth daily.  Patient taking differently: Take 20 mg by mouth daily before breakfast.   Rushie Chestnut, PA-C Taking Active Self  traMADol (ULTRAM) 50 MG tablet [id number] Yes Take 1 tablet (50 mg total) by mouth every  6 (six) hours as needed for severe pain. For AFTER surgery only, do not take and drive Doylene Bode, NP Taking Active Self           Med Note Lisbeth Ply   Wed Sep 07, 2022  8:12 AM) On hand.  TYLENOL 500 MG tablet [id number] Yes Take 500-1,000 mg by  mouth every 6 (six) hours as needed for mild pain or headache. [provider] Taking Active Self           Home Care and Equipment/Supplies: Were Home Health Services Ordered?: No Any new equipment or medical supplies ordered?: No  Functional Questionnaire: Do you need assistance with bathing/showering or dressing?: No Do you need assistance with meal preparation?: No Do you need assistance with eating?: No Do you have difficulty maintaining continence: No Do you need assistance with getting out of bed/getting out of a chair/moving?: No Do you have difficulty managing or taking your medications?: No  Follow up appointments reviewed: PCP Follow-up appointment confirmed?: NA (verified not indicated per hospital discharging provider discharge notes) Specialist Hospital Follow-up appointment confirmed?: Yes Date of Specialist follow-up appointment?: 10/03/22 (verified this is recommended time frame for follow up per hospital discharging provider notes) Follow-Up Specialty Provider:: oncology provider; has scheduled visit with gyn surgery for post-op visit on 10/07/22 (verified this is recommended time frame for follow up per hospital discharging provider notes) Do you need transportation to your follow-up appointment?: No Do you understand care options if your condition(s) worsen?: Yes-patient verbalized understanding  SDOH Interventions Today    Flowsheet Row Most Recent Value  SDOH Interventions   Food Insecurity Interventions Intervention Not Indicated  Transportation Interventions Intervention Not Indicated  [normally drives self,  daughter/ family assisting after recent surgery]      TOC Interventions Today    Flowsheet Row Most Recent Value  TOC Interventions   TOC Interventions Discussed/Reviewed TOC Interventions Discussed, Post op wound/incision care      Interventions Today    Flowsheet Row Most Recent Value  Chronic Disease   Chronic disease during  today's visit Other  [surgical ileostomy take down]  General Interventions   General Interventions Discussed/Reviewed General Interventions Discussed, Doctor Visits, Durable Medical Equipment (DME)  Doctor Visits Discussed/Reviewed Specialist, Doctor Visits Discussed, PCP  Durable Medical Equipment (DME) Other  [confirmed patient does not currently require use of assistive devices]  PCP/Specialist Visits Compliance with follow-up visit  Exercise Interventions   Exercise Discussed/Reviewed Exercise Discussed  [post-op activity restrictions]  Education Interventions   Education Provided Provided Education  Provided Verbal Education On Nutrition  [need to discuss progression of diet with surgical provider]  Nutrition Interventions   Nutrition Discussed/Reviewed Nutrition Discussed  Pharmacy Interventions   Pharmacy Dicussed/Reviewed Pharmacy Topics Discussed  Safety Interventions   Safety Discussed/Reviewed Safety Discussed      Caryl Pina, RN, BSN, CCRN Alumnus RN CM Care Coordination/ Transition of Care- [facility] Care Management [phone]: direct office

## 2022-09-20 ENCOUNTER — Ambulatory Visit: Payer: Medicare Other | Admitting: Family Medicine

## 2022-09-23 ENCOUNTER — Ambulatory Visit
Admission: RE | Admit: 2022-09-23 | Discharge: 2022-09-23 | Disposition: A | Discharge status: Home / Self Care | Payer: Medicare Other | Source: Ambulatory Visit | Attending: Family Medicine | Admitting: Family Medicine

## 2022-09-23 DIAGNOSIS — Z1231 Encounter for screening mammogram for malignant neoplasm of breast: Secondary | ICD-10-CM | POA: Diagnosis not present

## 2022-09-27 ENCOUNTER — Telehealth: Payer: Self-pay | Admitting: *Deleted

## 2022-09-27 NOTE — Telephone Encounter (Signed)
Patient called and stated "I have been packing this wound since I left the hospital. The packing that I was given will be gone by tomorrow. I have been using about 6 inches. Can I either just cover it with gauge and tape, or can I prescription be called in. My daughter is out of town and can't stop by to pick it up." Explained that the message will be given to [facility] APP and the office will call her back. Pharmacy verified

## 2022-09-29 ENCOUNTER — Encounter: Payer: Self-pay | Admitting: Hematology & Oncology

## 2022-09-29 ENCOUNTER — Encounter: Payer: Self-pay | Admitting: Family Medicine

## 2022-09-29 ENCOUNTER — Other Ambulatory Visit: Payer: Self-pay

## 2022-09-29 MED ORDER — MAGNESIUM OXIDE -MG SUPPLEMENT 400 (240 MG) MG PO TABS
400.0000 mg | ORAL_TABLET | Freq: Every day | ORAL | 3 refills | Status: DC
Start: 2022-09-29 — End: 2023-01-23

## 2022-09-30 NOTE — Progress Notes (Signed)
Normal mammogram. Follow up in 1 year.

## 2022-10-03 ENCOUNTER — Telehealth: Payer: Self-pay | Admitting: Pharmacist

## 2022-10-03 ENCOUNTER — Inpatient Hospital Stay: Payer: Medicare Other | Attending: Hematology & Oncology

## 2022-10-03 ENCOUNTER — Other Ambulatory Visit: Payer: Self-pay

## 2022-10-03 ENCOUNTER — Encounter: Payer: Self-pay | Admitting: Hematology & Oncology

## 2022-10-03 ENCOUNTER — Telehealth: Payer: Self-pay

## 2022-10-03 ENCOUNTER — Inpatient Hospital Stay (HOSPITAL_BASED_OUTPATIENT_CLINIC_OR_DEPARTMENT_OTHER): Payer: Medicare Other | Admitting: Hematology & Oncology

## 2022-10-03 ENCOUNTER — Other Ambulatory Visit (HOSPITAL_COMMUNITY): Payer: Self-pay

## 2022-10-03 ENCOUNTER — Inpatient Hospital Stay: Payer: Medicare Other

## 2022-10-03 VITALS — BP 109/68 | HR 70 | Temp 97.9°F | Resp 17 | Wt 109.0 lb

## 2022-10-03 DIAGNOSIS — Z9889 Other specified postprocedural states: Secondary | ICD-10-CM | POA: Insufficient documentation

## 2022-10-03 DIAGNOSIS — Z803 Family history of malignant neoplasm of breast: Secondary | ICD-10-CM | POA: Diagnosis not present

## 2022-10-03 DIAGNOSIS — C561 Malignant neoplasm of right ovary: Secondary | ICD-10-CM

## 2022-10-03 DIAGNOSIS — R21 Rash and other nonspecific skin eruption: Secondary | ICD-10-CM | POA: Diagnosis not present

## 2022-10-03 DIAGNOSIS — Z90722 Acquired absence of ovaries, bilateral: Secondary | ICD-10-CM | POA: Diagnosis not present

## 2022-10-03 DIAGNOSIS — Z87891 Personal history of nicotine dependence: Secondary | ICD-10-CM | POA: Diagnosis not present

## 2022-10-03 DIAGNOSIS — Z90710 Acquired absence of both cervix and uterus: Secondary | ICD-10-CM | POA: Insufficient documentation

## 2022-10-03 LAB — CBC WITH DIFFERENTIAL (CANCER CENTER ONLY)
Abs Immature Granulocytes: 0.01 10*3/uL (ref 0.00–0.07)
Basophils Absolute: 0 10*3/uL (ref 0.0–0.1)
Basophils Relative: 0 %
Eosinophils Absolute: 0.1 10*3/uL (ref 0.0–0.5)
Eosinophils Relative: 1 %
HCT: 32 % — ABNORMAL LOW (ref 36.0–46.0)
Hemoglobin: 10.3 g/dL — ABNORMAL LOW (ref 12.0–15.0)
Immature Granulocytes: 0 %
Lymphocytes Relative: 24 %
Lymphs Abs: 1.1 10*3/uL (ref 0.7–4.0)
MCH: 33 pg (ref 26.0–34.0)
MCHC: 32.2 g/dL (ref 30.0–36.0)
MCV: 102.6 fL — ABNORMAL HIGH (ref 80.0–100.0)
Monocytes Absolute: 0.4 10*3/uL (ref 0.1–1.0)
Monocytes Relative: 9 %
Neutro Abs: 3.2 10*3/uL (ref 1.7–7.7)
Neutrophils Relative %: 66 %
Platelet Count: 230 10*3/uL (ref 150–400)
RBC: 3.12 MIL/uL — ABNORMAL LOW (ref 3.87–5.11)
RDW: 12.7 % (ref 11.5–15.5)
WBC Count: 4.8 10*3/uL (ref 4.0–10.5)
nRBC: 0 % (ref 0.0–0.2)

## 2022-10-03 LAB — CMP (CANCER CENTER ONLY)
ALT: 18 U/L (ref 0–44)
AST: 19 U/L (ref 15–41)
Albumin: 4.2 g/dL (ref 3.5–5.0)
Alkaline Phosphatase: 76 U/L (ref 38–126)
Anion gap: 9 (ref 5–15)
BUN: 24 mg/dL — ABNORMAL HIGH (ref 8–23)
CO2: 27 mmol/L (ref 22–32)
Calcium: 10.1 mg/dL (ref 8.9–10.3)
Chloride: 105 mmol/L (ref 98–111)
Creatinine: 0.95 mg/dL (ref 0.44–1.00)
GFR, Estimated: >60 mL/min (ref >60)
Glucose, Bld: 98 mg/dL (ref 70–99)
Potassium: 4 mmol/L (ref 3.5–5.1)
Sodium: 141 mmol/L (ref 135–145)
Total Bilirubin: 0.4 mg/dL (ref 0.3–1.2)
Total Protein: 6 g/dL — ABNORMAL LOW (ref 6.5–8.1)

## 2022-10-03 LAB — MAGNESIUM: Magnesium: 1.1 mg/dL — ABNORMAL LOW (ref 1.7–2.4)

## 2022-10-03 MED ORDER — OLAPARIB 150 MG PO TABS
150.0000 mg | ORAL_TABLET | Freq: Two times a day (BID) | ORAL | 5 refills | Status: DC
  Filled 2022-10-03: qty 60, 30d supply, fill #0

## 2022-10-03 MED ORDER — AMOXICILLIN 500 MG PO TABS: 2000.0000 mg | ORAL_TABLET | Freq: Once | ORAL | 5 refills | Status: AC

## 2022-10-03 NOTE — Telephone Encounter (Signed)
Oral Oncology Pharmacist Encounter  Received new prescription for Lynparza (olaparib) for the maintenance treatment of advanced ovarian cancer, planned duration 2 years or until unacceptable toxicity.  CBC w/ Diff and CMP from 10/03/22 assessed, no renal or hepatic dose adjustments required at this time. Dosing per MD discretion. Patient starting on low dose due to concerns with tolerability. Prescription dose and frequency assessed.  Current medication list in Epic reviewed, no relevant/significant DDIs with [place] identified.  Evaluated chart and no patient barriers to medication adherence noted.   Prescription has been e-scribed to the [facility] for benefits analysis and approval.  Oral Oncology Clinic will continue to follow for insurance authorization, copayment issues, initial counseling and start date.  Lenord Carbo, PharmD, BCPS, [facility] Hematology/Oncology Clinical Pharmacist [facility] Oral Chemotherapy Navigation Clinics [phone] 10/03/2022 4:13 PM

## 2022-10-03 NOTE — Patient Instructions (Signed)

## 2022-10-03 NOTE — Progress Notes (Signed)
Hematology and Oncology Follow Up Visit  Maria Romero [id number] December 04, 1950 72 y.o. 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  She looks fantastic.  She did have colostomy reversal.  This was done in late May.  The pathology did not show any evidence of obvious resilient disease.  Now, I think we can get her on maintenance therapy.  I will go ahead and put her on olaparib.  However, I would probably put her on half dose.  I know she has a tough time with chemotherapy.  Even with targeted therapy, she may have some difficulty.  I think that it started off on 150 mg p.o. twice daily would be reasonable starting point.  She is eating well.  She is having no nausea or vomiting.  She is having no cough or shortness of breath.  She is having no bleeding.  There is no change in bowel or bladder habits.  Her last CEA-125 was down to 12.2 in early May.  At the present time, I would say that her performance status is probably ECOG 1.       Wt Readings from Last 3 Encounters:  10/03/22 109 lb (49.4 kg)  09/07/22 109 lb (49.4 kg)  08/30/22 109 lb (49.4 kg)    Medications:  Current Outpatient Medications:    fluconazole (DIFLUCAN) 100 MG tablet, Take 100 mg by mouth daily., Disp: , Rfl:    atorvastatin (LIPITOR) 40 MG tablet, TAKE ONE TABLET BY MOUTH EVERY NIGHT AT BEDTIME (Patient not taking: Reported on 09/13/2022), Disp: 90 tablet, Rfl: 3   Biotin 63875 MCG TABS, Take 10,000 mcg by mouth daily., Disp: , Rfl:    Calcium-Vitamin D-Vitamin K (VIACTIV PO), Take 1 tablet by  mouth in the morning and at bedtime., Disp: , Rfl:    cholecalciferol (VITAMIN D3) 25 MCG (1000 UNIT) tablet, Take 1,000 Units by mouth daily., Disp: , Rfl:    lidocaine-prilocaine (EMLA) cream, Apply 1 Application topically as needed (for port access)., Disp: , Rfl:    magnesium oxide (MAG-OX) 400 (240 Mg) MG tablet, Take 1 tablet (400 mg total) by mouth daily., Disp: 30 tablet, Rfl: 3   omeprazole (PRILOSEC) 20 MG capsule, Take 1 capsule (20 mg total) by mouth daily. (Patient taking differently: Take 20 mg by mouth daily before breakfast.), Disp: 90 capsule, Rfl: 2   TYLENOL 500 MG tablet, Take 500-1,000 mg by mouth every 6 (six) hours as needed for mild pain or headache., Disp: , Rfl:   Allergies:  Allergies  Allergen Reactions   Fluvastatin Sodium Other (See Comments)    Myalgias   Sudafed [Pseudoephedrine Hcl]     Makes her feel "high"    Past Medical History, Surgical history, Social history, and Family History were reviewed and updated.  Review of Systems: Review of Systems  Constitutional: Negative.   HENT:  Negative.    Eyes: Negative.   Respiratory: Negative.    Cardiovascular: Negative.   Gastrointestinal:  Negative for abdominal pain.  Genitourinary:  Negative for pelvic pain.   Musculoskeletal: Negative.   Skin: Negative.   Neurological:  Negative.   Hematological: Negative.   Psychiatric/Behavioral: Negative.      Physical Exam:  weight is 109 lb (49.4 kg). Her oral temperature is 97.9 F (36.6 C). Her blood pressure is 109/68 and her pulse is 70. Her respiration is 17 and oxygen saturation is 100%.   Wt Readings from Last 3 Encounters:  10/03/22 109 lb (49.4 kg)  09/07/22 109 lb (49.4 kg)  08/30/22 109 lb (49.4 kg)    Physical Exam Vitals reviewed.  HENT:     Head: Normocephalic and atraumatic.  Eyes:     Pupils: Pupils are equal, round, and reactive to light.  Cardiovascular:     Rate and Rhythm: Normal rate and regular rhythm.     Heart sounds:  Normal heart sounds.  Pulmonary:     Effort: Pulmonary effort is normal.     Breath sounds: Normal breath sounds.  Chest:    Abdominal:     General: Bowel sounds are normal.     Palpations: Abdomen is soft.     Comments: Abdominal exam shows a colostomy in the right lower quadrant.  There is a well-healed laparotomy scar.  She has no fluid wave.  There is no palpable abdominal mass.  There is no palpable liver or spleen tip.  Is scantly distended  Musculoskeletal:        General: No tenderness or deformity. Normal range of motion.     Cervical back: Normal range of motion.  Lymphadenopathy:     Cervical: No cervical adenopathy.  Skin:    General: Skin is warm and dry.     Findings: No erythema or rash.  Neurological:     Mental Status: She is alert and oriented to person, place, and time.  Psychiatric:        Behavior: Behavior normal.        Thought Content: Thought content normal.        Judgment: Judgment normal.     Lab Results  Component Value Date   WBC 4.8 10/03/2022   HGB 10.3 (L) 10/03/2022   HCT 32.0 (L) 10/03/2022   MCV 102.6 (H) 10/03/2022   PLT 230 10/03/2022     Chemistry      Component Value Date/Time   NA 141 10/03/2022 1200   K 4.0 10/03/2022 1200   CL 105 10/03/2022 1200   CO2 27 10/03/2022 1200   BUN 24 (H) 10/03/2022 1200   CREATININE 0.95 10/03/2022 1200   CREATININE 0.93 11/03/2021 0000      Component Value Date/Time   CALCIUM 10.1 10/03/2022 1200   ALKPHOS 76 10/03/2022 1200   AST 19 10/03/2022 1200   ALT 18 10/03/2022 1200   BILITOT 0.4 10/03/2022 1200        Impression and Plan: Maria Romero is a very charming 72 year old white female.  She had neoadjuvant chemotherapy.  She really had a tough time with neoadjuvant chemotherapy.  She subsequently underwent surgical resection.  She did still have active disease.    We then gave her some additional chemotherapy.  By her CA-125, she was then remission.  I think that her colostomy  reversal certainly confirms that she is in remission.  Again, we will start her on olaparib.  I think this would be a good idea for her.  She does have the HRD abnormality.  Again, we will start her on half dose of olaparib.  I think she should tolerate this.  We will have to watch out for blood counts.  I told her that this could certainly cause some diarrhea.  Here because of skin rash.  She may have some fatigue.  There is certainly could be some nausea.  I am just happy that we can now have her on maintenance therapy.  I know she really had a tough time with treatment this year.  I would like to see her back in about 3 or 4 months.  I would not plan for any scans probably until August.  Josph Macho, MD 6/24/202412:41 PM

## 2022-10-03 NOTE — Telephone Encounter (Signed)
Oral Oncology Patient Advocate Encounter  After completing a benefits investigation, prior authorization for Garey Ham is not required at this time through [employer].   Prior Authorization already on file until 04/11/23.  Patient's copay is $2,462.33.   Initiated PAP. See additional encounter.     Ardeen Fillers, CPhT Oncology Pharmacy Patient Advocate  [facility]  [phone] (phone) [phone] (fax) 10/03/2022 4:09 PM

## 2022-10-04 ENCOUNTER — Telehealth: Payer: Self-pay

## 2022-10-04 ENCOUNTER — Encounter: Payer: Self-pay | Admitting: *Deleted

## 2022-10-04 ENCOUNTER — Encounter: Payer: Self-pay | Admitting: Family Medicine

## 2022-10-04 ENCOUNTER — Ambulatory Visit (INDEPENDENT_AMBULATORY_CARE_PROVIDER_SITE_OTHER): Payer: Medicare Other | Admitting: Family Medicine

## 2022-10-04 VITALS — BP 125/75 | HR 76 | Ht 64.0 in | Wt 110.0 lb

## 2022-10-04 DIAGNOSIS — E7800 Pure hypercholesterolemia, unspecified: Secondary | ICD-10-CM

## 2022-10-04 DIAGNOSIS — M79602 Pain in left arm: Secondary | ICD-10-CM

## 2022-10-04 DIAGNOSIS — I7300 Raynaud's syndrome without gangrene: Secondary | ICD-10-CM | POA: Diagnosis not present

## 2022-10-04 LAB — CA 125: Cancer Antigen (CA) 125: 12.5 U/mL (ref 0.0–38.1)

## 2022-10-04 MED ORDER — OLAPARIB 150 MG PO TABS
150.0000 mg | ORAL_TABLET | Freq: Two times a day (BID) | ORAL | 5 refills | Status: DC
Start: 2022-10-04 — End: 2022-12-07

## 2022-10-04 NOTE — Telephone Encounter (Signed)
Oral Oncology Pharmacist Encounter   Patient approved for manufacturer assistance through [employer]. Prescription redirected to [facility] for dispensing.    Oral Chemotherapy Clinic will follow up with outside pharmacy to ensure Rx is delivered.    Lenord Carbo, PharmD, BCPS, [facility] Hematology/Oncology Clinical Pharmacist [facility] and [facility] Oral Chemotherapy Navigation Clinics [phone] 10/04/2022 8:53 AM

## 2022-10-04 NOTE — Assessment & Plan Note (Signed)
Blood pressure looks great.  Plan will be to likely restart the amlodipine in the fall when it starts to get cooler.  She was previously on 5 mg but if we need to start with 2.5 if blood pressures are running a little low we can certainly do that as well.

## 2022-10-04 NOTE — Progress Notes (Signed)
Established Patient Office Visit  Subjective   Patient ID: Maria Romero, female    DOB: Jun 04, 1950  Age: 72 y.o. MRN: [id number]  Chief Complaint  Patient presents with   Follow-up         HPI Recently underwent ileostomy takedown on Sep 07, 2022 with Dr. Denese Killings.  She still still doing some wound packing and wound management.  She did follow-up with Dr. Myna Hidalgo yesterday and has another follow-up later this week.  They are trying to get her started on Lynparza.  Plan is to follow-up with scans every 3 months for a year and then if everything looks good every 4 months.  Magnesium levels have been low so they bumped up her oral mag to twice a day monitoring for loose stools.  After chemo has been having some neuropathy but that is some better.    Upper arm pain for 2 months.    Raynaud's -doing well over the summer so far off of the amlodipine.  Hyperlipidemia - tolerating stating well with no myalgias or significant side effects.  Lab Results  Component Value Date   CHOL 136 03/30/2022   HDL 50 03/30/2022   LDLCALC 54 03/30/2022   LDLDIRECT 63 05/10/2021   TRIG 196 (H) 03/30/2022   CHOLHDL 2.2 11/05/2020    Dental appointment scheduled for next month.  She has prophylactic antibiotics to use.  She is also had some pain over that left outer arm for about 2 months.  It is more bothersome with certain motions of the arm.  She has not been doing any specific treatments she has used some Aspercreme on it a couple of times.    ROS    Objective:     BP 125/75   Pulse 76   Ht 5\' 4"  (1.626 m)   Wt 110 lb (49.9 kg)   SpO2 100%   BMI 18.88 kg/m    Physical Exam Vitals and nursing note reviewed.  Constitutional:      Appearance: She is well-developed.  HENT:     Head: Normocephalic and atraumatic.  Cardiovascular:     Rate and Rhythm: Normal rate and regular rhythm.     Heart sounds: Normal heart sounds.  Pulmonary:     Effort: Pulmonary effort is  normal.     Breath sounds: Normal breath sounds.  Musculoskeletal:     Left upper arm: Normal. No swelling, edema, deformity or bony tenderness.       Arms:     Comments: Area of discomfort with abduction of the arm.   Skin:    General: Skin is warm and dry.  Neurological:     Mental Status: She is alert and oriented to person, place, and time.  Psychiatric:        Behavior: Behavior normal.      No results found for any visits on 10/04/22.    The 10-year ASCVD risk score (Arnett DK, et al., 2019) is: 11.8%    Assessment & Plan:   Problem List Items Addressed This Visit       Cardiovascular and Mediastinum   Raynaud disease - Primary    Blood pressure looks great.  Plan will be to likely restart the amlodipine in the fall when it starts to get cooler.  She was previously on 5 mg but if we need to start with 2.5 if blood pressures are running a little low we can certainly do that as well.  Other   Hypomagnesemia   HYPERCHOLESTEROLEMIA   Relevant Orders   Lipid Panel w/reflex Direct LDL   Other Visit Diagnoses     Left arm pain           Left arm pain most consistent with proximal bicep tendinitis.  Handout given for exercises to do on her own at home.  If not improving over the next 3 to 4 weeks then please let us know.  Printed order for lipid panel to take to oncology so they can draw that when they drawn etc. labs since she still has her port in.  Hypomag-now on twice daily magnesium.  No follow-ups on file.    Nani Gasser, MD

## 2022-10-04 NOTE — Progress Notes (Signed)
Patient will now begin maintenance treatment. Will discontinue active navigation at this time but be available to the patient as needed in the future.   Oncology Nurse Navigator Documentation     10/04/2022    9:15 AM  Oncology Nurse Navigator Flowsheets  Navigation Complete Date: 10/04/2022  Post Navigation: Continue to Follow Patient? No  Reason Not Navigating Patient: Patient On Maintenance Chemotherapy  Navigator Location [facility]  Navigator Encounter Type Appt/Treatment Plan Review  Patient Visit Type MedOnc  Treatment Phase Active Tx  Barriers/Navigation Needs No Barriers At This Time  Interventions None Required  Acuity Level 1-No Barriers  Support Groups/Services Friends and Family  Time Spent with Patient 15

## 2022-10-04 NOTE — Telephone Encounter (Signed)
Oral Oncology Patient Advocate Encounter   Submitted application for assistance for Lynparza to [employer] Pres[employer] Savings Program.   Application submitted via online web portal.   [employer]'s phone number [phone].   I will continue to check the status until final determination.    Ardeen Fillers, CPhT Oncology Pharmacy Patient Advocate  [facility]  [phone] (phone) [phone] (fax) 10/04/2022 8:05 AM

## 2022-10-04 NOTE — Telephone Encounter (Signed)
Oral Oncology Patient Advocate Encounter   Began application for assistance for Lynparza through [employer] Prescription Savings Program.   Application will be submitted upon completion of necessary supporting documentation.   [employer]'s phone number [phone].   I will continue to check the status until final determination.    Ardeen Fillers, CPhT Oncology Pharmacy Patient Advocate  [facility]  [phone] (phone) [phone] (fax) 10/04/2022 8:04 AM

## 2022-10-04 NOTE — Telephone Encounter (Signed)
Oral Oncology Patient Advocate Encounter   Received notification that the application for assistance for Lynparza through [employer] Pres[employer] Savings Program has been approved.   [employer]'s phone number [phone].   Effective dates: 10/04/22 through 04/11/23  Medication will be filled at [facility].   Ardeen Fillers, CPhT Oncology Pharmacy Patient Advocate  [facility]  [phone] (phone) [phone] (fax) 10/04/2022 8:06 AM

## 2022-10-05 ENCOUNTER — Other Ambulatory Visit: Payer: Self-pay

## 2022-10-05 MED ORDER — ONDANSETRON HCL 8 MG PO TABS: 8.0000 mg | ORAL_TABLET | Freq: Three times a day (TID) | ORAL | 0 refills | Status: AC | PRN

## 2022-10-05 NOTE — Telephone Encounter (Signed)
Spoke to patient regarding Patient Assistance Program approval. Patient knows to expect a call from [employer] to initialize first shipment of [place]. Patient was also provided [employer]'s phone number of [phone] to call if she has not heard from them by Thursday, 10/06/22. Patient also knows to call me at [phone] with any questions or concerns regarding receiving medication from [employer].    Ardeen Fillers, CPhT Oncology Pharmacy Patient Advocate  [facility]  [phone] (phone) [phone] (fax) 10/05/2022 9:05 AM

## 2022-10-05 NOTE — Telephone Encounter (Signed)
Oral Chemotherapy Pharmacist Encounter  I spoke with patient today for overview of: Maria Romero for the maintenance treatment of advanced ovarian cancer, planned duration 2 years or unacceptable toxicity.   Counseled patient on administration, dosing, side effects, monitoring, drug-food interactions, safe handling, storage, and disposal.  Patient will take Lynparza 150mg  tablets, 1 tablets (150mg ) by mouth 2 times daily without regard to food. Patient on dose   Patient counseled that administering with food may increase tolerability.  Patient knows to avoid grapefruit or grapefruit juice while on therapy with Lynparza.  Maria Romero start date: patient will start once she receives medication from [employer] patient assistance program   Adverse effects include but are not limited to: nausea, vomiting, diarrhea, taste changes, mouth sores, fatigue, decreased blood counts. Nausea/Vomiting: Patient states she has a few pills of compazine. Will request MD office to send in ondansetron for patient to have PRN. We discussed that if she has nausea while on Lynparza, she can take anti-emetic 30-60 minutes before each dose to help decrease N/V.  Diarrhea: Patient has Imodium on hand at home and knows how to use this. She know to alert the office of 4 or more loose stools above baseline.  Reviewed with patient importance of keeping a medication schedule and plan for any missed doses. No barriers to medication adherence identified.  Medication reconciliation performed and medication/allergy list updated.  All questions answered.  Maria Romero voiced understanding and appreciation.   Medication education handout placed in mail for patient. Patient knows to call the office with questions or concerns. Oral Chemotherapy Clinic phone number provided to patient.   Lenord Carbo, PharmD, BCPS, [facility] Hematology/Oncology Clinical Pharmacist [facility] Oral Chemotherapy Navigation  Clinics [phone] 10/05/2022 10:33 AM

## 2022-10-06 ENCOUNTER — Encounter: Payer: Self-pay | Admitting: Gynecologic Oncology

## 2022-10-07 ENCOUNTER — Other Ambulatory Visit: Payer: Self-pay

## 2022-10-07 ENCOUNTER — Encounter: Payer: Self-pay | Admitting: Gynecologic Oncology

## 2022-10-07 ENCOUNTER — Inpatient Hospital Stay (HOSPITAL_BASED_OUTPATIENT_CLINIC_OR_DEPARTMENT_OTHER): Payer: Medicare Other | Admitting: Gynecologic Oncology

## 2022-10-07 VITALS — BP 133/79 | HR 76 | Temp 97.8°F | Ht 63.78 in | Wt 110.0 lb

## 2022-10-07 DIAGNOSIS — Z9889 Other specified postprocedural states: Secondary | ICD-10-CM

## 2022-10-07 DIAGNOSIS — C561 Malignant neoplasm of right ovary: Secondary | ICD-10-CM

## 2022-10-07 DIAGNOSIS — Z7189 Other specified counseling: Secondary | ICD-10-CM

## 2022-10-07 DIAGNOSIS — C569 Malignant neoplasm of unspecified ovary: Secondary | ICD-10-CM

## 2022-10-07 NOTE — Patient Instructions (Signed)
It was good to see you today.  You are healing well from surgery!  I will see you for follow-up in 4 months.  As always, if you develop any new and concerning symptoms before your next visit, please call to see me sooner.

## 2022-10-07 NOTE — Progress Notes (Signed)
Gynecologic Oncology Return Clinic Visit  10/07/22  Reason for Visit: follow-up, treatment planning  Treatment History: Oncology History  [name], right (HCC)  11/15/2021 Imaging   CT A/P: 1. Large volume ascites with peritoneal thickening and omental caking in the left abdomen. Imaging features are consistent with metastatic disease. 2. Contiguous with the posterior uterine fundus and extending into the right adnexal space is a poorly marginated heterogeneous 9.6 x 8.8 x 9.4 cm mass. Imaging features are consistent with neoplasm likely of right ovarian etiology. Associated heterogeneously enhancing mass involving the posterior lower uterine segment and cervix extending posteriorly towards the cul-de-sac. This may be a drop metastasis. 3. Soft tissue fullness also noted left adnexal region without normal left ovary by CT. 4. Subtle irregularity of liver contour raising the question of but not definitive for cirrhosis. 5. Small left and tiny right pleural effusions. 6. Aortic Atherosclerosis (ICD10-I70.0).   11/25/2021 Imaging   CT chest 1. Bilateral pleural effusions greatest on the LEFT both small volume but with fissural nodularity along the major fissure in the LEFT chest. Early involvement in the chest is not excluded. Sampling of pleural fluid may be helpful. 2. No adenopathy by size criteria. Tiny pulmonary nodule in the LEFT lower lobe as described. 3. Three-vessel coronary artery disease greatest in LEFT coronary circulation. 4. Reduction in malignant ascites since previous imaging following paracentesis.   12/01/2021 Initial Diagnosis   [name], right (HCC)   12/01/2021 Cancer Staging   Staging form: Ovary, Fallopian Tube, and Primary Peritoneal Carcinoma, AJCC 8th Edition - Clinical stage from 12/01/2021: FIGO Stage IIIC (cT3c, cN1b, cM0) - Signed by Josph Macho, MD on 12/01/2021 Stage prefix: Initial diagnosis Histologic grade (G): G2 Histologic grading  system: 4 grade system   12/07/2021 - 06/29/2022 Chemotherapy   Patient is on Treatment Plan : OVARIAN Carboplatin (AUC 6) + Paclitaxel (175) q21d X 6 Cycles     01/11/2022 - 06/08/2022 Chemotherapy   Patient is on Treatment Plan : OVARIAN Bevacizumab q21d      Genetic Testing   Negative genetic testing. No pathogenic variants identified on the [facility] Common Hereditary Cancers+RNA panel. The report date is 01/24/2022.  The Common Hereditary Cancers Panel + RNA offered by [facility] includes sequencing and/or deletion duplication testing of the following 47 genes: APC, ATM, AXIN2, BARD1, BMPR1A, BRCA1, BRCA2, BRIP1, CDH1, CDKN2A (p14ARF), CDKN2A (p16INK4a), CKD4, CHEK2, CTNNA1, DICER1, EPCAM (Deletion/duplication testing only), GREM1 (promoter region deletion/duplication testing only), KIT, MEN1, MLH1, MSH2, MSH3, MSH6, MUTYH, NBN, NF1, NHTL1, PALB2, PDGFRA, PMS2, POLD1, POLE, PTEN, RAD50, RAD51C, RAD51D, SDHB, SDHC, SDHD, SMAD4, SMARCA4. STK11, TP53, TSC1, TSC2, and VHL.  The following genes were evaluated for sequence changes only: SDHA and HOXB13 c.251G>A variant only.   03/08/2022 Imaging   CT C/A/P: 1. Reduction in the omental caking of tumor compared to 11/15/2021, although substantial tumor remains. 2. The right adnexal mass has a volume of 220 cubic cm and appears to be primarily solid, although components may be hemorrhagic. Previous ascites has convinced into a loculated collection of fluid surrounding this adnexal mass and measuring 1400 cc. 3. Questionable bowel wall thickening in the transverse colon, although much of the appearance may be due to nondistention. Correlate with any signs of colitis. 4. Previous right pleural effusion has resolved. Residual small left pleural effusion is nonspecific for transudative or exudative etiology. 5. Other imaging findings of potential clinical significance: Substantial left anterior descending coronary artery atherosclerosis. Grade 1  degenerative anterolisthesis L4-5. Degenerative facet arthropathy  at L4-5 and L5-S1. Old healed right posterior rib fractures. Small calcifications along the pleural margin of the left hemidiaphragm, possibly from prior asbestos exposure or prior pleurodesis. 6. Aortic atherosclerosis.   04/20/2022 Surgery   Diagnostic laparoscopy, exploratory laparotomy with lysis of adhesions for approximately 45 minutes, omentectomy including mobilization of the splenic flexure, radical tumor debulking with en bloc resection of bilateral adnexa, uterus, cervix and rectosigmoid colon, end-to-end colonic reanastomosis, diverting loop ileostomy, cystoscopy   Findings: On EUA, fixed pelvic mass with nodularity appreciated within the posterior cul-de-sac.  On intra-abdominal entry the laparoscope, upper abdominal adhesions limiting assessment of the left upper quadrant.  Large, cystic mass fills the mid abdomen.  On laparotomy, approximately 22 cm cystic mass spans from the deep pelvis to the upper abdomen with a rind.  This lesion appears to be walling off the intra-abdominal ascites.  Multiple loops of small bowel are adherent to the cystic lesion.  There is significant filmy adhesive disease between loops of small bowel, the bowel and cystic lesion, and the small bowel and anterior abdominal wall.  Also numerous less than 1 cm white nodules noted along the small bowel and mesentery (frequently within filmy adhesions), most consistent with treated tumor.  During lysis of adhesions, 2 areas of serosal injury noted along the ileum, oversewn.  Approximately 4-6 cm tumor implant within the greater omentum and omental nodularity extending along the omentum to the splenic flexure.  Lesser sac without disease.  Stomach normal in appearance.,  Adhesions between the liver and the anterior abdominal wall bilaterally, no nodularity appreciated along either the liver or diaphragm.  Appendix itself normal-appearing, appendiceal  mesentery minimally adherent to the right adnexa.  Normal cecum, ascending colon, transverse colon, and descending colon.  Large cystic mass noted to be adherent to the bladder peritoneum and uterus anteriorly within the pelvis.  Left adnexa is somewhat distorted.  Right ovary replaced by an 8 cm cystic mass.  Normal-appearing distal right fallopian fimbria.  Area of the sigmoid colon adherent posterior to the right adnexa, requiring an en bloc resection.  Somewhat woody texture palpated along the rectovaginal septum after reverse hysterectomy performed.  Negative bubble test after rectosigmoid anastomosis.  In the setting of planned additional treatment and recent receipt of bevacizumab, decision made to perform diverting loop ileostomy. Multiple adhesions and nodular implants sent.  If these are negative for malignancy or show treated tumor, then this was an R0 resection.  Otherwise, R1 resection. On cystoscopy, bladder dome intact. Good efflux seen from bilateral ureteral orifices.    04/20/2022 Pathology Results   A. SMALL BOWEL ADHESION, EXCISION: -Fibrous tissue with chronic inflammation, consistent with adhesions  B. SMALL BOWEL NODULE, EXCISION: - Benign fibrotic nodule - Negative for carcinoma  C. UTERUS, CERVIX, BILATERAL TUBES AND OVARIES, RECTOSIGMOID COLON, RESECTION: - Cervix: Benign, nabothian cyst - Endometrium: Benign inactive endometrium - Myometrium: No significant pathologic changes - Ovary: High-grade serous carcinoma - Colon: Metastatic serous carcinoma involving colonic serosa - See oncology table  D. OMENTUM, EXCISION: - Involved by high-grade serous carcinoma  E. RECTAL DONUTS, EXCISION: - Benign colonic mucosa with no significant pathologic changes   ONCOLOGY TABLE:  OVARY or FALLOPIAN TUBE or PRIMARY PERITONEUM: Resection  Procedure: Total hysterectomy, rectosigmoid colon resection, omental excision, adhesiolysis Specimen Integrity: Disrupted Tumor Site:  Ovary Tumor Size: 10.5 x 7.5 x 5 cm Histologic Type: serous carcinoma Histologic Grade: High-grade Ovarian Surface Involvement: See comment Fallopian Tube Surface Involvement: Not identified Implants (required for advanced stage serous/seromucinous  borderline tumors only): Invasive implants Other Tissue/ Organ Involvement: Colonic serosa Largest Extrapelvic Peritoneal Focus: 8 cm (omental implant) Peritoneal/Ascitic Fluid Involvement: Present Chemotherapy Response Score (CRS): CRS2 Regional Lymph Nodes: Not applicable       Distant Metastasis:      Distant Site(s) Involved: None Pathologic Stage Classification (pTNM, AJCC 8th Edition): pT3c, pN[not assigned] Ancillary Studies: Can be performed upon request Representative Tumor Block: C11 Comment(s): Assessment of ovarian surface involvement is limited by disrupted and necrotic nature of the specimen. The tumor is located in the right ovary per the surgeon, an additional ovary and fallopian tube are not identified. An additional section is submitted to identify possible fallopian tube.      Interval History: Patient reports doing very well.  Bowel is functioning normally.  She is taking half of Senokot at night.  Has minimal intermittent abdominal pain at the site of her healing incision and right lower quadrant.  Tolerating diet without nausea or emesis.  Is excited about introducing foods back into her diet that she had been avoiding.  Denies any urinary symptoms.  Past Medical/Surgical History: Past Medical History:  Diagnosis Date   Anemia    Complication of anesthesia    Dyspnea    GERD (gastroesophageal reflux disease)    Goals of care, counseling/discussion 12/01/2021   Hypercholesterolemia    Hypertension    IBS (irritable bowel syndrome)    Ileostomy in place [facility])    Malignant ascites 11/19/2021   Neuromuscular disorder (HCC)    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   Osteopenia    Osteoporosis    [name], right (HCC)  12/01/2021   Ovarian mass, right 11/19/2021   PONV (postoperative nausea and vomiting)    Toe fracture    lt small toe    Past Surgical History:  Procedure Laterality Date   BOWEL RESECTION N/A 04/20/2022   Procedure: BOWEL RESECTION; DIVERTING ILEOSTOMY;  Surgeon: Carver Fila, MD;  Location: [facility];  Service: Gynecology;  Laterality: N/A;   DEBULKING N/A 04/20/2022   Procedure: TUMOR DEBULKING, OMENTECTOMY;  Surgeon: Carver Fila, MD;  Location: [facility];  Service: Gynecology;  Laterality: N/A;   HYSTERECTOMY ABDOMINAL WITH SALPINGO-OOPHORECTOMY Bilateral 04/20/2022   Procedure: HYSTERECTOMY ABDOMINAL WITH BILATERAL SALPINGO-OOPHORECTOMY;  Surgeon: Carver Fila, MD;  Location: [facility];  Service: Gynecology;  Laterality: Bilateral;   ILEOSTOMY CLOSURE N/A 09/07/2022   Procedure: ILEOSTOMY TAKEDOWN;  Surgeon: Carver Fila, MD;  Location: [facility];  Service: General;  Laterality: N/A;   IR IMAGING GUIDED PORT INSERTION  11/26/2021   IR PARACENTESIS  11/23/2021   IR PARACENTESIS  11/26/2021   LAPAROSCOPY N/A 04/20/2022   Procedure: LAPAROSCOPY DIAGNOSTIC,CYSTO;  Surgeon: Carver Fila, MD;  Location: [facility];  Service: Gynecology;  Laterality: N/A;   lump removal  04/11/1993   axilla right - benign   TONSILLECTOMY     TUBAL LIGATION  04/11/1988    Family History  Problem Relation Age of Onset   Stroke Mother    Hypertension Mother    Hyperlipidemia Mother    Glaucoma Mother    Heart attack Father 55   Diabetes Father    Hypertension Father    Hyperlipidemia Father    Cataracts Father    Breast cancer Cousin    Colon cancer Neg Hx    Ovarian cancer Neg Hx    Endometrial cancer Neg Hx    Pancreatic cancer Neg Hx    Prostate cancer Neg Hx  Social History   Socioeconomic History   Marital status: Widowed    Spouse name: Not on file   Number of children: 1   Years of education: 14   Highest education level: Bachelor's degree (e.g., BA, AB, BS)   Occupational History   Occupation: retired Designer, industrial/product  Tobacco Use   Smoking status: Former    Packs/day: 1.50    Years: 20.00    Additional pack years: 0.00    Total pack years: 30.00    Types: Cigarettes    Quit date: 03/20/1990    Years since quitting: 32.5   Smokeless tobacco: Never   Tobacco comments:    quit 1988  Vaping Use   Vaping Use: Never used  Substance and Sexual Activity   Alcohol use: Not Currently    Alcohol/week: 2.0 standard drinks of alcohol    Types: 2 Glasses of wine per week    Comment: Wine in the evenings   Drug use: No   Sexual activity: Not Currently    Partners: Male  Other Topics Concern   Not on file  Social History Narrative   Lives alone with her dog. Helps with her grandson pick up and volunteering at his school. She enjoys spending time with her family.   Social Determinants of Health   Financial Resource Strain: Low Risk  (09/30/2022)   Overall Financial Resource Strain (CARDIA)    Difficulty of Paying Living Expenses: Not hard at all  Food Insecurity: No Food Insecurity (09/30/2022)   Hunger Vital Sign    Worried About Running Out of Food in the Last Year: Never true    Ran Out of Food in the Last Year: Never true  Transportation Needs: No Transportation Needs (09/30/2022)   PRAPARE - Administrator, Civil Service (Medical): No    Lack of Transportation (Non-Medical): No  Physical Activity: Unknown (09/30/2022)   Exercise Vital Sign    Days of Exercise per Week: 0 days    Minutes of Exercise per Session: Not on file  Stress: No Stress Concern Present (09/30/2022)   [employer] of Occupational Health - Occupational Stress Questionnaire    Feeling of Stress : Only a little  Social Connections: Socially Isolated (09/30/2022)   Social Connection and Isolation Panel [NHANES]    Frequency of Communication with Friends and Family: More than three times a week    Frequency of Social Gatherings with Friends and Family: More  than three times a week    Attends Religious Services: Never    Database administrator or Organizations: No    Attends Banker Meetings: Not on file    Marital Status: Widowed    Current Medications:  Current Outpatient Medications:    amoxicillin (AMOXIL) 500 MG capsule, Take 1,000 mg by mouth 2 (two) times daily., Disp: , Rfl:    atorvastatin (LIPITOR) 40 MG tablet, TAKE ONE TABLET BY MOUTH EVERY NIGHT AT BEDTIME, Disp: 90 tablet, Rfl: 3   Biotin 81191 MCG TABS, Take 10,000 mcg by mouth daily., Disp: , Rfl:    Calcium-Vitamin D-Vitamin K (VIACTIV PO), Take 1 tablet by mouth in the morning and at bedtime., Disp: , Rfl:    cholecalciferol (VITAMIN D3) 25 MCG (1000 UNIT) tablet, Take 1,000 Units by mouth daily., Disp: , Rfl:    lidocaine-prilocaine (EMLA) cream, Apply 1 Application topically as needed (for port access)., Disp: , Rfl:    magnesium oxide (MAG-OX) 400 (240 Mg) MG tablet, Take 1 tablet (  400 mg total) by mouth daily. (Patient taking differently: Take 400 mg by mouth 2 (two) times daily.), Disp: 30 tablet, Rfl: 3   olaparib (LYNPARZA) 150 MG tablet, Take 1 tablet (150 mg total) by mouth 2 (two) times daily. Swallow whole. May take with food to decrease nausea and vomiting., Disp: 60 tablet, Rfl: 5   omeprazole (PRILOSEC) 20 MG capsule, Take 1 capsule (20 mg total) by mouth daily. (Patient taking differently: Take 20 mg by mouth daily before breakfast.), Disp: 90 capsule, Rfl: 2   ondansetron (ZOFRAN) 8 MG tablet, Take 1 tablet (8 mg total) by mouth every 8 (eight) hours as needed for nausea or vomiting., Disp: 20 tablet, Rfl: 0   TYLENOL 500 MG tablet, Take 500-1,000 mg by mouth every 6 (six) hours as needed for mild pain or headache., Disp: , Rfl:   Review of Systems: + ear ringing Denies appetite changes, fevers, chills, fatigue, unexplained weight changes. Denies hearing loss, neck lumps or masses, mouth sores, voice changes. Denies cough or wheezing.  Denies  shortness of breath. Denies chest pain or palpitations. Denies leg swelling. Denies abdominal distention, pain, blood in stools, constipation, diarrhea, nausea, vomiting, or early satiety. Denies pain with intercourse, dysuria, frequency, hematuria or incontinence. Denies hot flashes, pelvic pain, vaginal bleeding or vaginal discharge.   Denies joint pain, back pain or muscle pain/cramps. Denies itching, rash, or wounds. Denies dizziness, headaches, numbness or seizures. Denies swollen lymph nodes or glands, denies easy bruising or bleeding. Denies anxiety, depression, confusion, or decreased concentration.  Physical Exam: BP 133/79 (BP Location: Left Arm, Patient Position: Sitting)   Pulse 76   Temp 97.8 F (36.6 C)   Ht 5' 3.78" (1.62 m)   Wt 110 lb (49.9 kg)   SpO2 100%   BMI 19.01 kg/m  General: Alert, oriented, no acute distress. HEENT: Normocephalic, atraumatic, sclera anicteric. Chest: Unlabored breathing on room air. Abdomen: soft, nontender.  Normoactive bowel sounds.  Incision is healing well.  There is a small, less than 2 x 1.5 cm opening that is very superficial, granulation tissue at the base.  No erythema, induration or exudate. Extremities: Grossly normal range of motion.  Warm, well perfused.  No edema bilaterally.  Laboratory & Radiologic Studies: Component Ref Range & Units 4 d ago (10/03/22) 1 mo ago (08/17/22) 3 mo ago (06/29/22) 4 mo ago (06/08/22) 4 mo ago (05/16/22) 6 mo ago (03/30/22) 6 mo ago (03/18/22)  Cancer Antigen (CA) 125 0.0 - 38.1 U/mL 12.5 12.2 CM 15.3 CM 20.4 CM 31.9 CM 167.0 High  CM 280.0 High  CM   Assessment & Plan: Maria Romero is a 72 y.o. 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 Has completed 3 cycles of adjuvant chemotherapy. Germline testing negative. HRD+.  Patient is doing very well status post ileostomy reversal.  Discussed continued expectations.  Given HRD positive tumor, plan  is for her to start PARPi maintenance therapy.  Will plan to see her back in 4 months.  She will continue to see her medical oncologist at regular intervals.  16 minutes of total time was spent for this patient encounter, including preparation, face-to-face counseling with the patient and coordination of care, and documentation of the encounter.  Eugene Garnet, MD  Division of Gynecologic Oncology  Department of Obstetrics and Gynecology  [facility]

## 2022-10-31 ENCOUNTER — Inpatient Hospital Stay: Payer: Medicare Other

## 2022-10-31 ENCOUNTER — Inpatient Hospital Stay: Payer: Medicare Other | Attending: Hematology & Oncology

## 2022-10-31 ENCOUNTER — Other Ambulatory Visit: Payer: Self-pay

## 2022-10-31 ENCOUNTER — Inpatient Hospital Stay (HOSPITAL_BASED_OUTPATIENT_CLINIC_OR_DEPARTMENT_OTHER): Payer: Medicare Other | Admitting: Hematology & Oncology

## 2022-10-31 ENCOUNTER — Encounter: Payer: Self-pay | Admitting: Hematology & Oncology

## 2022-10-31 VITALS — BP 139/66 | HR 77 | Temp 98.4°F | Resp 18 | Wt 110.0 lb

## 2022-10-31 VITALS — BP 120/73 | HR 68 | Resp 17

## 2022-10-31 DIAGNOSIS — E7800 Pure hypercholesterolemia, unspecified: Secondary | ICD-10-CM | POA: Diagnosis not present

## 2022-10-31 DIAGNOSIS — Z90722 Acquired absence of ovaries, bilateral: Secondary | ICD-10-CM | POA: Diagnosis not present

## 2022-10-31 DIAGNOSIS — Z90710 Acquired absence of both cervix and uterus: Secondary | ICD-10-CM | POA: Diagnosis not present

## 2022-10-31 DIAGNOSIS — C561 Malignant neoplasm of right ovary: Secondary | ICD-10-CM | POA: Insufficient documentation

## 2022-10-31 DIAGNOSIS — D702 Other drug-induced agranulocytosis: Secondary | ICD-10-CM

## 2022-10-31 LAB — CBC WITH DIFFERENTIAL (CANCER CENTER ONLY)
Abs Immature Granulocytes: 0.01 10*3/uL (ref 0.00–0.07)
Basophils Absolute: 0 10*3/uL (ref 0.0–0.1)
Basophils Relative: 1 %
Eosinophils Absolute: 0.1 10*3/uL (ref 0.0–0.5)
Eosinophils Relative: 2 %
HCT: 32.9 % — ABNORMAL LOW (ref 36.0–46.0)
Hemoglobin: 10.7 g/dL — ABNORMAL LOW (ref 12.0–15.0)
Immature Granulocytes: 0 %
Lymphocytes Relative: 23 %
Lymphs Abs: 1.2 10*3/uL (ref 0.7–4.0)
MCH: 32.4 pg (ref 26.0–34.0)
MCHC: 32.5 g/dL (ref 30.0–36.0)
MCV: 99.7 fL (ref 80.0–100.0)
Monocytes Absolute: 0.4 10*3/uL (ref 0.1–1.0)
Monocytes Relative: 8 %
Neutro Abs: 3.5 10*3/uL (ref 1.7–7.7)
Neutrophils Relative %: 66 %
Platelet Count: 193 10*3/uL (ref 150–400)
RBC: 3.3 MIL/uL — ABNORMAL LOW (ref 3.87–5.11)
RDW: 13.8 % (ref 11.5–15.5)
WBC Count: 5.3 10*3/uL (ref 4.0–10.5)
nRBC: 0 % (ref 0.0–0.2)

## 2022-10-31 LAB — MAGNESIUM: Magnesium: 1.2 mg/dL — ABNORMAL LOW (ref 1.7–2.4)

## 2022-10-31 LAB — CMP (CANCER CENTER ONLY)
ALT: 14 U/L (ref 0–44)
AST: 18 U/L (ref 15–41)
Albumin: 4.3 g/dL (ref 3.5–5.0)
Alkaline Phosphatase: 50 U/L (ref 38–126)
Anion gap: 9 (ref 5–15)
BUN: 27 mg/dL — ABNORMAL HIGH (ref 8–23)
CO2: 28 mmol/L (ref 22–32)
Calcium: 10.3 mg/dL (ref 8.9–10.3)
Chloride: 102 mmol/L (ref 98–111)
Creatinine: 1.08 mg/dL — ABNORMAL HIGH (ref 0.44–1.00)
GFR, Estimated: 54 mL/min — ABNORMAL LOW (ref >60)
Glucose, Bld: 100 mg/dL — ABNORMAL HIGH (ref 70–99)
Potassium: 3.9 mmol/L (ref 3.5–5.1)
Sodium: 139 mmol/L (ref 135–145)
Total Bilirubin: 0.5 mg/dL (ref 0.3–1.2)
Total Protein: 6.4 g/dL — ABNORMAL LOW (ref 6.5–8.1)

## 2022-10-31 LAB — RETICULOCYTES
Immature Retic Fract: 7.7 % (ref 2.3–15.9)
RBC.: 3.31 MIL/uL — ABNORMAL LOW (ref 3.87–5.11)
Retic Count, Absolute: 49 10*3/uL (ref 19.0–186.0)
Retic Ct Pct: 1.5 % (ref 0.4–3.1)

## 2022-10-31 LAB — FERRITIN: Ferritin: 194 ng/mL (ref 11–307)

## 2022-10-31 LAB — IRON AND IRON BINDING CAPACITY (CC-WL,HP ONLY)
Iron: 70 ug/dL (ref 28–170)
Saturation Ratios: 25 % (ref 10.4–31.8)
TIBC: 284 ug/dL (ref 250–450)
UIBC: 214 ug/dL (ref 148–442)

## 2022-10-31 MED ORDER — HEPARIN SOD (PORK) LOCK FLUSH 100 UNIT/ML IV SOLN
500.0000 [IU] | Freq: Once | INTRAVENOUS | Status: AC
  Administered 2022-10-31: 500 [IU] via INTRAVENOUS

## 2022-10-31 MED ORDER — MAGNESIUM SULFATE 4 GM/100ML IV SOLN
4.0000 g | Freq: Once | INTRAVENOUS | Status: AC
  Administered 2022-10-31: 4 g via INTRAVENOUS
  Filled 2022-10-31: qty 100

## 2022-10-31 MED ORDER — SODIUM CHLORIDE 0.9% FLUSH
10.0000 mL | Freq: Once | INTRAVENOUS | Status: AC
  Administered 2022-10-31: 10 mL via INTRAVENOUS

## 2022-10-31 MED ORDER — SODIUM CHLORIDE 0.9 % IV SOLN: INTRAVENOUS | Status: DC

## 2022-10-31 NOTE — Patient Instructions (Signed)
Magnesium Citrate Solution What is this medication? MAGNESIUM CITRATE (mag NEE zee um SI treyt) treats occasional constipation. It works by increasing the amount of water your intestine absorbs. This softens the stool, making it easier to have a bowel movement. It also increases pressure, which prompts the muscles in your intestines to move stool. It belongs to a group of medications called laxatives. This medicine may be used for other purposes; ask your health care provider or pharmacist if you have questions. COMMON BRAND NAME(S): Citroma, OneLAX What should I tell my care team before I take this medication? They need to know if you have any of these conditions: Are on a low magnesium or low sodium diet Change in bowel habits for 2 weeks Colostomy or ileostomy Constipation after using another laxative for 7 days Diabetes Kidney disease Rectal bleeding Stomach pain, nausea, or vomiting An unusual or allergic reaction to magnesium citrate, other magnesium products, other medications, foods, dyes, or preservatives Pregnant or trying to get pregnant Breast-feeding How should I use this medication? Take this medication by mouth. Follow the directions on the package or prescription label. Use a specially marked spoon or container to measure each dose. Ask your pharmacist if you do not have one. Household spoons are not accurate. Drink a full glass of fluid with each dose of this medication. This medication may taste better if it is chilled before you drink it. Do not take your medication more often than directed. Talk to your care team about the use of this medication in children. While this medication may be prescribed for children as young as 65 years of age for selected conditions, precautions do apply. Overdosage: If you think you have taken too much of this medicine contact a poison control center or emergency room at once. NOTE: This medicine is only for you. Do not share this medicine with  others. What if I miss a dose? This does not apply; this medication is not for regular use. What may interact with this medication? Cellulose sodium phosphate Digoxin Edetate disodium, EDTA Medications for bone strength like etidronate, ibandronate, risedronate Sodium polystyrene sulfonate Some antibiotics like ciprofloxacin, doxycycline, gatifloxacin, levofloxacin, tetracycline Vitamin D This list may not describe all possible interactions. Give your health care provider a list of all the medicines, herbs, non-prescription drugs, or dietary supplements you use. Also tell them if you smoke, drink alcohol, or use illegal drugs. Some items may interact with your medicine. What should I watch for while using this medication? Tell your care team if your symptoms do not start to get better or if they get worse. Do not take any other medication by mouth within 2 hours of taking this medication. What side effects may I notice from receiving this medication? Side effects that you should report to your care team as soon as possible: Allergic reactions--skin rash, itching, hives, swelling of the face, lips, tongue, or throat High magnesium level--confusion, drowsiness, facial flushing, redness, sweating, muscle weakness, fast or irregular heartbeat, trouble breathing Side effects that usually do not require medical attention (report to your care team if they continue or are bothersome): Diarrhea Nausea Stomach pain Vomiting This list may not describe all possible side effects. Call your doctor for medical advice about side effects. You may report side effects to FDA at 1-800-FDA-1088. Where should I keep my medication? Keep out of the reach of children. Store at room temperature or in the refrigerator between 8 and 30 degrees C (46 and 86 degrees F). Throw away  any unused medication 24 hours after opening the bottle. Throw away unopened bottles of medication after the expiration date. NOTE: This  sheet is a summary. It may not cover all possible information. If you have questions about this medicine, talk to your doctor, pharmacist, or health care provider.  2024 Elsevier/Gold Standard (2021-02-11 00:00:00)

## 2022-10-31 NOTE — Patient Instructions (Signed)

## 2022-10-31 NOTE — Progress Notes (Signed)
Hematology and Oncology Follow Up Visit  Maria Romero [id number] [birth year] 72 y.o. 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  She is doing quite well.  She looks quite good.  She is doing well with the olaparib.  I have her on half dose.  She is doing well with this.  I think we might be able to try to increase the dose a little bit.  I think what we might try is 300 mg alternating with 150 mg a day.  We will have to see what her CA 125 looks like.  The last time we checked it was 12.5.  She has had no abdominal pain.  She has had no cough or shortness of breath.  She has had no change in bowel or bladder habits.  Is been no diarrhea.  She says her taste has changed a little bit.  She has little bit of soreness with her tongue.  There is no ulcerations.  Currently, I would say that her performance status is probably ECOG 1.        Wt Readings from Last 3 Encounters:  10/31/22 110 lb (49.9 kg)  10/07/22 110 lb (49.9 kg)  10/04/22 110 lb (49.9 kg)    Medications:  Current Outpatient Medications:    amoxicillin (AMOXIL) 500 MG capsule, Take 1,000 mg by mouth 2 (two) times daily., Disp: , Rfl:    atorvastatin (LIPITOR) 40 MG tablet, TAKE ONE TABLET BY MOUTH EVERY NIGHT AT BEDTIME, Disp: 90 tablet, Rfl: 3   Biotin 42595 MCG TABS, Take 10,000 mcg by mouth daily., Disp: , Rfl:    Calcium-Vitamin D-Vitamin K (VIACTIV PO), Take 1 tablet by mouth in the morning and at bedtime., Disp: , Rfl:    cholecalciferol (VITAMIN D3) 25 MCG  (1000 UNIT) tablet, Take 1,000 Units by mouth daily., Disp: , Rfl:    lidocaine-prilocaine (EMLA) cream, Apply 1 Application topically as needed (for port access)., Disp: , Rfl:    magnesium oxide (MAG-OX) 400 (240 Mg) MG tablet, Take 1 tablet (400 mg total) by mouth daily. (Patient taking differently: Take 400 mg by mouth 2 (two) times daily.), Disp: 30 tablet, Rfl: 3   olaparib (LYNPARZA) 150 MG tablet, Take 1 tablet (150 mg total) by mouth 2 (two) times daily. Swallow whole. May take with food to decrease nausea and vomiting., Disp: 60 tablet, Rfl: 5   omeprazole (PRILOSEC) 20 MG capsule, Take 1 capsule (20 mg total) by mouth daily. (Patient taking differently: Take 20 mg by mouth daily before breakfast.), Disp: 90 capsule, Rfl: 2   ondansetron (ZOFRAN) 8 MG tablet, Take 1 tablet (8 mg total) by mouth every 8 (eight) hours as needed for nausea or vomiting., Disp: 20 tablet, Rfl: 0   TYLENOL 500 MG tablet, Take 500-1,000 mg by mouth every 6 (six) hours as needed for mild pain or headache., Disp: , Rfl:   Allergies:  Allergies  Allergen Reactions   Fluvastatin Sodium Other (See Comments)    Myalgias   Sudafed [Pseudoephedrine Hcl]  Makes her feel "high"    Past Medical History, Surgical history, Social history, and Family History were reviewed and updated.  Review of Systems: Review of Systems  Constitutional: Negative.   HENT:  Negative.    Eyes: Negative.   Respiratory: Negative.    Cardiovascular: Negative.   Gastrointestinal:  Negative for abdominal pain.  Genitourinary:  Negative for pelvic pain.   Musculoskeletal: Negative.   Skin: Negative.   Neurological: Negative.   Hematological: Negative.   Psychiatric/Behavioral: Negative.      Physical Exam:  weight is 110 lb (49.9 kg). Her oral temperature is 98.4 F (36.9 C). Her blood pressure is 139/66 and her pulse is 77. Her respiration is 18 and oxygen saturation is 100%.   Wt Readings from Last 3 Encounters:  10/31/22  110 lb (49.9 kg)  10/07/22 110 lb (49.9 kg)  10/04/22 110 lb (49.9 kg)    Physical Exam Vitals reviewed.  HENT:     Head: Normocephalic and atraumatic.  Eyes:     Pupils: Pupils are equal, round, and reactive to light.  Cardiovascular:     Rate and Rhythm: Normal rate and regular rhythm.     Heart sounds: Normal heart sounds.  Pulmonary:     Effort: Pulmonary effort is normal.     Breath sounds: Normal breath sounds.  Chest:    Abdominal:     General: Bowel sounds are normal.     Palpations: Abdomen is soft.     Comments: Abdominal exam shows a colostomy in the right lower quadrant.  There is a well-healed laparotomy scar.  She has no fluid wave.  There is no palpable abdominal mass.  There is no palpable liver or spleen tip.  Is scantly distended  Musculoskeletal:        General: No tenderness or deformity. Normal range of motion.     Cervical back: Normal range of motion.  Lymphadenopathy:     Cervical: No cervical adenopathy.  Skin:    General: Skin is warm and dry.     Findings: No erythema or rash.  Neurological:     Mental Status: She is alert and oriented to person, place, and time.  Psychiatric:        Behavior: Behavior normal.        Thought Content: Thought content normal.        Judgment: Judgment normal.      Lab Results  Component Value Date   WBC 5.3 10/31/2022   HGB 10.7 (L) 10/31/2022   HCT 32.9 (L) 10/31/2022   MCV 99.7 10/31/2022   PLT 193 10/31/2022     Chemistry      Component Value Date/Time   NA 141 10/03/2022 1200   K 4.0 10/03/2022 1200   CL 105 10/03/2022 1200   CO2 27 10/03/2022 1200   BUN 24 (H) 10/03/2022 1200   CREATININE 0.95 10/03/2022 1200   CREATININE 0.93 11/03/2021 0000      Component Value Date/Time   CALCIUM 10.1 10/03/2022 1200   ALKPHOS 76 10/03/2022 1200   AST 19 10/03/2022 1200   ALT 18 10/03/2022 1200   BILITOT 0.4 10/03/2022 1200        Impression and Plan: Maria Romero is a very charming 72 year old  white female.  She had neoadjuvant chemotherapy.  She really had a tough time with neoadjuvant chemotherapy.  She subsequently underwent surgical resection.  She did still have active disease.    We then gave her some additional chemotherapy.  By her CA-125, she was then remission.  I think that her colostomy reversal certainly confirms that she is in remission.  Again, we are going to change the olaparib a little bit and try to increase that some.  This will be a gradual process.  We will have to give her some magnesium today.  Again, we will see what the CA 125 looks like.  I will let have her come back in another month.  If everything  looks good and a month, then we will put her on full dose olaparib.    Josph Macho, MD 7/22/202412:41 PM

## 2022-11-01 LAB — LIPID PANEL W/REFLEX DIRECT LDL
Cholesterol: 156 mg/dL (ref <200)
HDL: 61 mg/dL (ref >=50)
LDL Cholesterol (Calc): 73 mg/dL (calc)
Non-HDL Cholesterol (Calc): 95 mg/dL (calc) (ref <130)
Total CHOL/HDL Ratio: 2.6 (calc) (ref <5.0)
Triglycerides: 135 mg/dL (ref <150)

## 2022-11-01 LAB — CA 125: Cancer Antigen (CA) 125: 13.4 U/mL (ref 0.0–38.1)

## 2022-11-01 NOTE — Progress Notes (Signed)
Your lab work is within acceptable range and there are no concerning findings.   ?

## 2022-11-04 ENCOUNTER — Other Ambulatory Visit: Payer: Self-pay | Admitting: *Deleted

## 2022-11-04 MED ORDER — OMEPRAZOLE 20 MG PO CPDR: 20.0000 mg | DELAYED_RELEASE_CAPSULE | Freq: Every day | ORAL | 2 refills | Status: DC

## 2022-11-24 ENCOUNTER — Inpatient Hospital Stay: Payer: Medicare Other

## 2022-11-24 ENCOUNTER — Encounter: Payer: Self-pay | Admitting: Hematology & Oncology

## 2022-11-24 ENCOUNTER — Encounter: Payer: Self-pay | Admitting: *Deleted

## 2022-11-24 ENCOUNTER — Inpatient Hospital Stay: Payer: Medicare Other | Attending: Hematology & Oncology

## 2022-11-24 ENCOUNTER — Inpatient Hospital Stay (HOSPITAL_BASED_OUTPATIENT_CLINIC_OR_DEPARTMENT_OTHER): Payer: Medicare Other | Admitting: Hematology & Oncology

## 2022-11-24 VITALS — BP 124/69 | HR 67 | Temp 98.0°F | Resp 17 | Wt 109.0 lb

## 2022-11-24 VITALS — BP 123/64 | HR 66 | Resp 17

## 2022-11-24 DIAGNOSIS — R748 Abnormal levels of other serum enzymes: Secondary | ICD-10-CM

## 2022-11-24 DIAGNOSIS — D702 Other drug-induced agranulocytosis: Secondary | ICD-10-CM

## 2022-11-24 DIAGNOSIS — C561 Malignant neoplasm of right ovary: Secondary | ICD-10-CM

## 2022-11-24 DIAGNOSIS — Z95828 Presence of other vascular implants and grafts: Secondary | ICD-10-CM

## 2022-11-24 LAB — CMP (CANCER CENTER ONLY)
ALT: 17 U/L (ref 0–44)
AST: 20 U/L (ref 15–41)
Albumin: 4.2 g/dL (ref 3.5–5.0)
Alkaline Phosphatase: 45 U/L (ref 38–126)
Anion gap: 9 (ref 5–15)
BUN: 24 mg/dL — ABNORMAL HIGH (ref 8–23)
CO2: 27 mmol/L (ref 22–32)
Calcium: 9.8 mg/dL (ref 8.9–10.3)
Chloride: 102 mmol/L (ref 98–111)
Creatinine: 1.18 mg/dL — ABNORMAL HIGH (ref 0.44–1.00)
GFR, Estimated: 49 mL/min — ABNORMAL LOW (ref >60)
Glucose, Bld: 92 mg/dL (ref 70–99)
Potassium: 3.8 mmol/L (ref 3.5–5.1)
Sodium: 138 mmol/L (ref 135–145)
Total Bilirubin: 0.6 mg/dL (ref 0.3–1.2)
Total Protein: 6.5 g/dL (ref 6.5–8.1)

## 2022-11-24 LAB — FERRITIN: Ferritin: 208 ng/mL (ref 11–307)

## 2022-11-24 LAB — CBC WITH DIFFERENTIAL (CANCER CENTER ONLY)
Abs Immature Granulocytes: 0.01 10*3/uL (ref 0.00–0.07)
Basophils Absolute: 0 10*3/uL (ref 0.0–0.1)
Basophils Relative: 1 %
Eosinophils Absolute: 0.1 10*3/uL (ref 0.0–0.5)
Eosinophils Relative: 3 %
HCT: 30.9 % — ABNORMAL LOW (ref 36.0–46.0)
Hemoglobin: 10.2 g/dL — ABNORMAL LOW (ref 12.0–15.0)
Immature Granulocytes: 0 %
Lymphocytes Relative: 26 %
Lymphs Abs: 1 10*3/uL (ref 0.7–4.0)
MCH: 32.9 pg (ref 26.0–34.0)
MCHC: 33 g/dL (ref 30.0–36.0)
MCV: 99.7 fL (ref 80.0–100.0)
Monocytes Absolute: 0.4 10*3/uL (ref 0.1–1.0)
Monocytes Relative: 9 %
Neutro Abs: 2.4 10*3/uL (ref 1.7–7.7)
Neutrophils Relative %: 61 %
Platelet Count: 208 10*3/uL (ref 150–400)
RBC: 3.1 MIL/uL — ABNORMAL LOW (ref 3.87–5.11)
RDW: 16.3 % — ABNORMAL HIGH (ref 11.5–15.5)
WBC Count: 3.9 10*3/uL — ABNORMAL LOW (ref 4.0–10.5)
nRBC: 0 % (ref 0.0–0.2)

## 2022-11-24 LAB — IRON AND IRON BINDING CAPACITY (CC-WL,HP ONLY)
Iron: 76 ug/dL (ref 28–170)
Saturation Ratios: 28 % (ref 10.4–31.8)
TIBC: 273 ug/dL (ref 250–450)
UIBC: 197 ug/dL (ref 148–442)

## 2022-11-24 LAB — MAGNESIUM: Magnesium: 1.4 mg/dL — ABNORMAL LOW (ref 1.7–2.4)

## 2022-11-24 MED ORDER — SODIUM CHLORIDE 0.9% FLUSH
10.0000 mL | INTRAVENOUS | Status: DC | PRN
  Administered 2022-11-24: 10 mL via INTRAVENOUS

## 2022-11-24 MED ORDER — HEPARIN SOD (PORK) LOCK FLUSH 100 UNIT/ML IV SOLN
500.0000 [IU] | Freq: Once | INTRAVENOUS | Status: AC
  Administered 2022-11-24: 500 [IU] via INTRAVENOUS

## 2022-11-24 MED ORDER — SODIUM CHLORIDE 0.9 % IV SOLN: INTRAVENOUS | Status: DC

## 2022-11-24 MED ORDER — HEPARIN SOD (PORK) LOCK FLUSH 100 UNIT/ML IV SOLN: 500.0000 [IU] | Freq: Once | INTRAVENOUS | Status: DC

## 2022-11-24 MED ORDER — SODIUM CHLORIDE 0.9 % IV SOLN: 2.0000 g | Freq: Once | INTRAVENOUS | Status: DC

## 2022-11-24 MED ORDER — MAGNESIUM SULFATE 2 GM/50ML IV SOLN
2.0000 g | Freq: Once | INTRAVENOUS | Status: AC
  Administered 2022-11-24: 2 g via INTRAVENOUS
  Filled 2022-11-24: qty 50

## 2022-11-24 NOTE — Progress Notes (Signed)
Hematology and Oncology Follow Up Visit  Maria Romero [id number] 03-Apr-1951 72 y.o. 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  She is doing quite well.  She is on the olaparib right now.  I do not think we need to increase the dose at all at the present time.  Her white cell count is down a little bit.  She is having no problems with nausea or vomiting.  There is been no diarrhea.  She has had no abdominal pain.  There is no bleeding.  She has had no leg swelling.  Her last CA 125 was 13.4.  She has had no fever..  She is eating pretty well.  Overall, I would have said that her performance status is probably ECOG 1.       Wt Readings from Last 3 Encounters:  11/24/22 109 lb (49.4 kg)  10/31/22 110 lb (49.9 kg)  10/07/22 110 lb (49.9 kg)    Medications:  Current Outpatient Medications:    amoxicillin (AMOXIL) 500 MG capsule, Take 1,000 mg by mouth 2 (two) times daily., Disp: , Rfl:    atorvastatin (LIPITOR) 40 MG tablet, TAKE ONE TABLET BY MOUTH EVERY NIGHT AT BEDTIME, Disp: 90 tablet, Rfl: 3   Biotin 13244 MCG TABS, Take 10,000 mcg by mouth daily., Disp: , Rfl:    Calcium-Vitamin D-Vitamin K (VIACTIV PO), Take 1 tablet by mouth in the morning and at bedtime., Disp: , Rfl:    cholecalciferol (VITAMIN D3) 25 MCG (1000 UNIT) tablet, Take 1,000 Units by mouth daily., Disp: , Rfl:    lidocaine-prilocaine (EMLA) cream, Apply 1 Application topically as needed (for port access)., Disp: , Rfl:    magnesium oxide (MAG-OX) 400 (240  Mg) MG tablet, Take 1 tablet (400 mg total) by mouth daily. (Patient taking differently: Take 400 mg by mouth 2 (two) times daily.), Disp: 30 tablet, Rfl: 3   olaparib (LYNPARZA) 150 MG tablet, Take 1 tablet (150 mg total) by mouth 2 (two) times daily. Swallow whole. May take with food to decrease nausea and vomiting., Disp: 60 tablet, Rfl: 5   omeprazole (PRILOSEC) 20 MG capsule, Take 1 capsule (20 mg total) by mouth daily., Disp: 90 capsule, Rfl: 2   ondansetron (ZOFRAN) 8 MG tablet, Take 1 tablet (8 mg total) by mouth every 8 (eight) hours as needed for nausea or vomiting., Disp: 20 tablet, Rfl: 0   TYLENOL 500 MG tablet, Take 500-1,000 mg by mouth every 6 (six) hours as needed for mild pain or headache., Disp: , Rfl:  No current facility-administered medications for this visit.  Facility-Administered Medications Ordered in Other Visits:    heparin lock flush 100 unit/mL, 500 Units, Intravenous, Once, , Rose Phi, MD   sodium chloride flush (NS) 0.9 % injection 10 mL, 10 mL, Intravenous, PRN, Josph Macho, MD, 10 mL at 11/24/22 1219  Allergies:  Allergies  Allergen Reactions   Fluvastatin Sodium Other (See Comments)    Myalgias   Sudafed [Pseudoephedrine Hcl]     Makes her feel "high"  Past Medical History, Surgical history, Social history, and Family History were reviewed and updated.  Review of Systems: Review of Systems  Constitutional: Negative.   HENT:  Negative.    Eyes: Negative.   Respiratory: Negative.    Cardiovascular: Negative.   Gastrointestinal:  Negative for abdominal pain.  Genitourinary:  Negative for pelvic pain.   Musculoskeletal: Negative.   Skin: Negative.   Neurological: Negative.   Hematological: Negative.   Psychiatric/Behavioral: Negative.      Physical Exam:  weight is 109 lb (49.4 kg). Her oral temperature is 98 F (36.7 C). Her blood pressure is 124/69 and her pulse is 67. Her respiration is 17.   Wt Readings from Last 3 Encounters:   11/24/22 109 lb (49.4 kg)  10/31/22 110 lb (49.9 kg)  10/07/22 110 lb (49.9 kg)    Physical Exam Vitals reviewed.  HENT:     Head: Normocephalic and atraumatic.  Eyes:     Pupils: Pupils are equal, round, and reactive to light.  Cardiovascular:     Rate and Rhythm: Normal rate and regular rhythm.     Heart sounds: Normal heart sounds.  Pulmonary:     Effort: Pulmonary effort is normal.     Breath sounds: Normal breath sounds.  Chest:    Abdominal:     General: Bowel sounds are normal.     Palpations: Abdomen is soft.     Comments: Abdominal exam shows a colostomy in the right lower quadrant.  There is a well-healed laparotomy scar.  She has no fluid wave.  There is no palpable abdominal mass.  There is no palpable liver or spleen tip.  Is scantly distended  Musculoskeletal:        General: No tenderness or deformity. Normal range of motion.     Cervical back: Normal range of motion.  Lymphadenopathy:     Cervical: No cervical adenopathy.  Skin:    General: Skin is warm and dry.     Findings: No erythema or rash.  Neurological:     Mental Status: She is alert and oriented to person, place, and time.  Psychiatric:        Behavior: Behavior normal.        Thought Content: Thought content normal.        Judgment: Judgment normal.      Lab Results  Component Value Date   WBC 3.9 (L) 11/24/2022   HGB 10.2 (L) 11/24/2022   HCT 30.9 (L) 11/24/2022   MCV 99.7 11/24/2022   PLT 208 11/24/2022     Chemistry      Component Value Date/Time   NA 139 10/31/2022 1155   K 3.9 10/31/2022 1155   CL 102 10/31/2022 1155   CO2 28 10/31/2022 1155   BUN 27 (H) 10/31/2022 1155   CREATININE 1.08 (H) 10/31/2022 1155   CREATININE 0.93 11/03/2021 0000      Component Value Date/Time   CALCIUM 10.3 10/31/2022 1155   ALKPHOS 50 10/31/2022 1155   AST 18 10/31/2022 1155   ALT 14 10/31/2022 1155   BILITOT 0.5 10/31/2022 1155        Impression and Plan: Maria Romero is a very  charming 72 year old white female.  She had neoadjuvant chemotherapy.  She really had a tough time with neoadjuvant chemotherapy.  She subsequently underwent surgical resection.  She did still have active disease.    We then gave her some additional chemotherapy.  By her CA-125, she was then remission.  I think  that her colostomy reversal certainly confirms that she is in remission.  We will go ahead and get her set up with another set of scans probably in September.  We will have to see what her CA-125 looks like.  I am happy that she has a good quality of life right now.  I will plan to get her back in about 6 weeks.  Hopefully, we can keep moving her appointments I will bit further.  I might increase the olaparib in the future but for right now, I just do not think her white cell count can tolerate an increased dose.    Josph Macho, MD 8/15/20241:00 PM

## 2022-11-24 NOTE — Patient Instructions (Signed)
Magnesium Sulfate Injection What is this medication? MAGNESIUM SULFATE (mag NEE zee um SUL fate) prevents and treats low levels of magnesium in your body. It may also be used to prevent and treat seizures during pregnancy in people with high blood pressure disorders, such as preeclampsia or eclampsia. Magnesium plays an important role in maintaining the health of your muscles and nervous system. This medicine may be used for other purposes; ask your health care provider or pharmacist if you have questions. What should I tell my care team before I take this medication? They need to know if you have any of these conditions: Heart disease History of irregular heart beat Kidney disease An unusual or allergic reaction to magnesium sulfate, medications, foods, dyes, or preservatives Pregnant or trying to get pregnant Breast-feeding How should I use this medication? This medication is for infusion into a vein. It is given in a hospital or clinic setting. Talk to your care team about the use of this medication in children. While this medication may be prescribed for selected conditions, precautions do apply. Overdosage: If you think you have taken too much of this medicine contact a poison control center or emergency room at once. NOTE: This medicine is only for you. Do not share this medicine with others. What if I miss a dose? This does not apply. What may interact with this medication? Certain medications for anxiety or sleep Certain medications for seizures, such phenobarbital Digoxin Medications that relax muscles for surgery Narcotic medications for pain This list may not describe all possible interactions. Give your health care provider a list of all the medicines, herbs, non-prescription drugs, or dietary supplements you use. Also tell them if you smoke, drink alcohol, or use illegal drugs. Some items may interact with your medicine. What should I watch for while using this  medication? Your condition will be monitored carefully while you are receiving this medication. You may need blood work done while you are receiving this medication. What side effects may I notice from receiving this medication? Side effects that you should report to your care team as soon as possible: Allergic reactions--skin rash, itching, hives, swelling of the face, lips, tongue, or throat High magnesium level--confusion, drowsiness, facial flushing, redness, sweating, muscle weakness, fast or irregular heartbeat, trouble breathing Low blood pressure--dizziness, feeling faint or lightheaded, blurry vision Side effects that usually do not require medical attention (report to your care team if they continue or are bothersome): Headache Nausea This list may not describe all possible side effects. Call your doctor for medical advice about side effects. You may report side effects to FDA at 1-800-FDA-1088. Where should I keep my medication? This medication is given in a hospital or clinic and will not be stored at home. NOTE: This sheet is a summary. It may not cover all possible information. If you have questions about this medicine, talk to your doctor, pharmacist, or health care provider.  2024 Elsevier/Gold Standard (2020-12-09 00:00:00)  

## 2022-11-24 NOTE — Patient Instructions (Signed)

## 2022-11-25 ENCOUNTER — Other Ambulatory Visit: Payer: Self-pay | Admitting: Family Medicine

## 2022-11-25 DIAGNOSIS — I10 Essential (primary) hypertension: Secondary | ICD-10-CM

## 2022-11-26 LAB — CA 125: Cancer Antigen (CA) 125: 11.9 U/mL (ref 0.0–38.1)

## 2022-11-28 ENCOUNTER — Telehealth: Payer: Self-pay

## 2022-11-28 ENCOUNTER — Telehealth: Payer: Self-pay | Admitting: *Deleted

## 2022-11-28 NOTE — Telephone Encounter (Signed)
-----   Message from Josph Macho sent at 11/28/2022  5:41 AM EDT ----- Call - the CA 125 is still low at 12!!  Great news!! Cindee Lame

## 2022-11-28 NOTE — Telephone Encounter (Signed)
Pt to arrive prior to scan for port access

## 2022-11-28 NOTE — Telephone Encounter (Signed)
Advised via MyChart.

## 2022-12-07 ENCOUNTER — Other Ambulatory Visit: Payer: Self-pay | Admitting: *Deleted

## 2022-12-07 ENCOUNTER — Other Ambulatory Visit (HOSPITAL_COMMUNITY): Payer: Self-pay

## 2022-12-07 DIAGNOSIS — C561 Malignant neoplasm of right ovary: Secondary | ICD-10-CM

## 2022-12-07 MED ORDER — OLAPARIB 150 MG PO TABS
300.0000 mg | ORAL_TABLET | Freq: Two times a day (BID) | ORAL | 3 refills | Status: DC
Start: 2022-12-07 — End: 2022-12-07

## 2022-12-07 MED ORDER — OLAPARIB 150 MG PO TABS
300.0000 mg | ORAL_TABLET | Freq: Two times a day (BID) | ORAL | 3 refills | Status: DC
Start: 2022-12-07 — End: 2023-02-27

## 2022-12-29 ENCOUNTER — Ambulatory Visit (HOSPITAL_BASED_OUTPATIENT_CLINIC_OR_DEPARTMENT_OTHER)
Admission: RE | Admit: 2022-12-29 | Discharge: 2022-12-29 | Disposition: A | Discharge status: Home / Self Care | Payer: Medicare Other | Source: Ambulatory Visit | Attending: Hematology & Oncology | Admitting: Hematology & Oncology

## 2022-12-29 ENCOUNTER — Encounter (HOSPITAL_BASED_OUTPATIENT_CLINIC_OR_DEPARTMENT_OTHER): Payer: Self-pay

## 2022-12-29 ENCOUNTER — Inpatient Hospital Stay: Payer: Medicare Other | Attending: Hematology & Oncology

## 2022-12-29 VITALS — BP 124/58 | HR 69 | Temp 97.9°F | Resp 17

## 2022-12-29 DIAGNOSIS — Z8543 Personal history of malignant neoplasm of ovary: Secondary | ICD-10-CM | POA: Diagnosis present

## 2022-12-29 DIAGNOSIS — Z95828 Presence of other vascular implants and grafts: Secondary | ICD-10-CM

## 2022-12-29 DIAGNOSIS — R911 Solitary pulmonary nodule: Secondary | ICD-10-CM | POA: Insufficient documentation

## 2022-12-29 DIAGNOSIS — C561 Malignant neoplasm of right ovary: Secondary | ICD-10-CM | POA: Insufficient documentation

## 2022-12-29 DIAGNOSIS — I700 Atherosclerosis of aorta: Secondary | ICD-10-CM | POA: Insufficient documentation

## 2022-12-29 DIAGNOSIS — N281 Cyst of kidney, acquired: Secondary | ICD-10-CM | POA: Diagnosis not present

## 2022-12-29 MED ORDER — HEPARIN SOD (PORK) LOCK FLUSH 100 UNIT/ML IV SOLN
500.0000 [IU] | Freq: Once | INTRAVENOUS | Status: AC
  Administered 2022-12-29: 500 [IU] via INTRAVENOUS

## 2022-12-29 MED ORDER — SODIUM CHLORIDE 0.9% FLUSH
10.0000 mL | INTRAVENOUS | Status: DC | PRN
  Administered 2022-12-29: 10 mL via INTRAVENOUS

## 2022-12-29 MED ORDER — IOHEXOL 300 MG/ML  SOLN
100.0000 mL | Freq: Once | INTRAMUSCULAR | Status: AC | PRN
  Administered 2022-12-29: 100 mL via INTRAVENOUS

## 2023-01-04 ENCOUNTER — Telehealth: Payer: Self-pay

## 2023-01-04 NOTE — Telephone Encounter (Signed)
-----   Message from Josph Macho sent at 01/03/2023  7:50 PM EDT ----- Call - the CT scan is negative -- no obvious cancer!!!  Cindee Lame

## 2023-01-04 NOTE — Telephone Encounter (Signed)
Advised via MyChart.

## 2023-01-11 ENCOUNTER — Other Ambulatory Visit: Payer: Self-pay

## 2023-01-11 DIAGNOSIS — C561 Malignant neoplasm of right ovary: Secondary | ICD-10-CM

## 2023-01-12 ENCOUNTER — Encounter: Payer: Self-pay | Admitting: Hematology & Oncology

## 2023-01-12 ENCOUNTER — Inpatient Hospital Stay: Payer: Medicare Other | Admitting: Hematology & Oncology

## 2023-01-12 ENCOUNTER — Inpatient Hospital Stay: Payer: Medicare Other

## 2023-01-12 ENCOUNTER — Other Ambulatory Visit: Payer: Self-pay

## 2023-01-12 ENCOUNTER — Inpatient Hospital Stay: Payer: Medicare Other | Attending: Hematology & Oncology

## 2023-01-12 VITALS — BP 108/59 | HR 70

## 2023-01-12 DIAGNOSIS — Z90710 Acquired absence of both cervix and uterus: Secondary | ICD-10-CM | POA: Diagnosis not present

## 2023-01-12 DIAGNOSIS — Z79899 Other long term (current) drug therapy: Secondary | ICD-10-CM | POA: Insufficient documentation

## 2023-01-12 DIAGNOSIS — Z933 Colostomy status: Secondary | ICD-10-CM | POA: Insufficient documentation

## 2023-01-12 DIAGNOSIS — Z95828 Presence of other vascular implants and grafts: Secondary | ICD-10-CM

## 2023-01-12 DIAGNOSIS — C561 Malignant neoplasm of right ovary: Secondary | ICD-10-CM | POA: Diagnosis present

## 2023-01-12 DIAGNOSIS — Z9079 Acquired absence of other genital organ(s): Secondary | ICD-10-CM | POA: Insufficient documentation

## 2023-01-12 DIAGNOSIS — Z9221 Personal history of antineoplastic chemotherapy: Secondary | ICD-10-CM | POA: Insufficient documentation

## 2023-01-12 DIAGNOSIS — Z90722 Acquired absence of ovaries, bilateral: Secondary | ICD-10-CM | POA: Diagnosis not present

## 2023-01-12 LAB — CMP (CANCER CENTER ONLY)
ALT: 13 U/L (ref 0–44)
AST: 17 U/L (ref 15–41)
Albumin: 4.1 g/dL (ref 3.5–5.0)
Alkaline Phosphatase: 43 U/L (ref 38–126)
Anion gap: 8 (ref 5–15)
BUN: 21 mg/dL (ref 8–23)
CO2: 28 mmol/L (ref 22–32)
Calcium: 9.8 mg/dL (ref 8.9–10.3)
Chloride: 104 mmol/L (ref 98–111)
Creatinine: 1.08 mg/dL — ABNORMAL HIGH (ref 0.44–1.00)
GFR, Estimated: 54 mL/min — ABNORMAL LOW (ref >60)
Glucose, Bld: 90 mg/dL (ref 70–99)
Potassium: 4 mmol/L (ref 3.5–5.1)
Sodium: 140 mmol/L (ref 135–145)
Total Bilirubin: 0.6 mg/dL (ref 0.3–1.2)
Total Protein: 6.3 g/dL — ABNORMAL LOW (ref 6.5–8.1)

## 2023-01-12 LAB — CBC WITH DIFFERENTIAL (CANCER CENTER ONLY)
Abs Immature Granulocytes: 0.01 10*3/uL (ref 0.00–0.07)
Basophils Absolute: 0 10*3/uL (ref 0.0–0.1)
Basophils Relative: 1 %
Eosinophils Absolute: 0 10*3/uL (ref 0.0–0.5)
Eosinophils Relative: 1 %
HCT: 31.3 % — ABNORMAL LOW (ref 36.0–46.0)
Hemoglobin: 10.5 g/dL — ABNORMAL LOW (ref 12.0–15.0)
Immature Granulocytes: 0 %
Lymphocytes Relative: 21 %
Lymphs Abs: 0.9 10*3/uL (ref 0.7–4.0)
MCH: 35.5 pg — ABNORMAL HIGH (ref 26.0–34.0)
MCHC: 33.5 g/dL (ref 30.0–36.0)
MCV: 105.7 fL — ABNORMAL HIGH (ref 80.0–100.0)
Monocytes Absolute: 0.4 10*3/uL (ref 0.1–1.0)
Monocytes Relative: 8 %
Neutro Abs: 3.2 10*3/uL (ref 1.7–7.7)
Neutrophils Relative %: 69 %
Platelet Count: 200 10*3/uL (ref 150–400)
RBC: 2.96 MIL/uL — ABNORMAL LOW (ref 3.87–5.11)
RDW: 17.5 % — ABNORMAL HIGH (ref 11.5–15.5)
WBC Count: 4.6 10*3/uL (ref 4.0–10.5)
nRBC: 0 % (ref 0.0–0.2)

## 2023-01-12 LAB — FERRITIN: Ferritin: 169 ng/mL (ref 11–307)

## 2023-01-12 LAB — IRON AND IRON BINDING CAPACITY (CC-WL,HP ONLY)
Iron: 84 ug/dL (ref 28–170)
Saturation Ratios: 29 % (ref 10.4–31.8)
TIBC: 291 ug/dL (ref 250–450)
UIBC: 207 ug/dL (ref 148–442)

## 2023-01-12 LAB — MAGNESIUM: Magnesium: 1.5 mg/dL — ABNORMAL LOW (ref 1.7–2.4)

## 2023-01-12 MED ORDER — SODIUM CHLORIDE 0.9 % IV SOLN: INTRAVENOUS | Status: DC

## 2023-01-12 MED ORDER — HEPARIN SOD (PORK) LOCK FLUSH 100 UNIT/ML IV SOLN
500.0000 [IU] | Freq: Once | INTRAVENOUS | Status: AC
  Administered 2023-01-12: 500 [IU] via INTRAVENOUS

## 2023-01-12 MED ORDER — SODIUM CHLORIDE 0.9% FLUSH
10.0000 mL | Freq: Once | INTRAVENOUS | Status: AC
  Administered 2023-01-12: 10 mL

## 2023-01-12 MED ORDER — MAGNESIUM SULFATE 2 GM/50ML IV SOLN
2.0000 g | Freq: Once | INTRAVENOUS | Status: AC
  Administered 2023-01-12: 2 g via INTRAVENOUS
  Filled 2023-01-12: qty 50

## 2023-01-12 NOTE — Progress Notes (Signed)
Hematology and Oncology Follow Up Visit  Maria Romero [id number] November 18, 1950 72 y.o. 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  She is doing quite nicely.  She feels great.  She is having no problems with the olaparib.  Her last CA-125 was down to 11.9..  She is eating well.  She did have a recent CT scan.  This is done on 12/29/2022.  There is no evidence of recurrent ovarian cancer.  She was a bit constipated.  She says that she feels a lump under the right arm.  I examined her, and it was felt like a little cyst in the axilla.  It did not feel like a lymph node..  She has had no cough.  There is been no change in bowel or bladder habits.  She has had no leg swelling.  She has had no headache.  Overall, I would say performance status is probably ECOG 0.        Wt Readings from Last 3 Encounters:  01/12/23 110 lb (49.9 kg)  11/24/22 109 lb (49.4 kg)  10/31/22 110 lb (49.9 kg)    Medications:  Current Outpatient Medications:    amLODipine (NORVASC) 2.5 MG tablet, Take 1 tablet (2.5 mg total) by mouth daily., Disp: 90 tablet, Rfl: 3   amoxicillin (AMOXIL) 500 MG capsule, Take 1,000 mg by mouth 2 (two) times daily., Disp: , Rfl:    atorvastatin (LIPITOR) 40 MG tablet, TAKE ONE TABLET BY MOUTH EVERY NIGHT AT BEDTIME, Disp: 90 tablet, Rfl: 3   Biotin 63875 MCG TABS, Take 10,000 mcg by mouth daily., Disp: , Rfl:    Calcium-Vitamin D-Vitamin K (VIACTIV PO), Take 1 tablet by mouth in the morning and at bedtime., Disp: , Rfl:     cholecalciferol (VITAMIN D3) 25 MCG (1000 UNIT) tablet, Take 1,000 Units by mouth daily., Disp: , Rfl:    lidocaine-prilocaine (EMLA) cream, Apply 1 Application topically as needed (for port access)., Disp: , Rfl:    magnesium oxide (MAG-OX) 400 (240 Mg) MG tablet, Take 1 tablet (400 mg total) by mouth daily. (Patient taking differently: Take 400 mg by mouth 2 (two) times daily.), Disp: 30 tablet, Rfl: 3   olaparib (LYNPARZA) 150 MG tablet, Take 2 tablets (300 mg total) by mouth 2 (two) times daily. Take 2 tablets (300 mg total) by mouth 2 (two) times daily. Swallow whole. May take with food to decrease nausea and vomiting. Take as instructed per MD, Disp: 120 tablet, Rfl: 3   omeprazole (PRILOSEC) 20 MG capsule, Take 1 capsule (20 mg total) by mouth daily., Disp: 90 capsule, Rfl: 2   ondansetron (ZOFRAN) 8 MG tablet, Take 1 tablet (8 mg total) by mouth every 8 (eight) hours as needed for nausea or vomiting., Disp: 20 tablet, Rfl: 0   TYLENOL 500 MG tablet, Take 500-1,000 mg by mouth every 6 (six) hours as needed for mild pain or headache., Disp: , Rfl:   Allergies:  Allergies  Allergen Reactions   Fluvastatin Sodium Other (See Comments)    Myalgias  Sudafed [Pseudoephedrine Hcl]     Makes her feel "high"    Past Medical History, Surgical history, Social history, and Family History were reviewed and updated.  Review of Systems: Review of Systems  Constitutional: Negative.   HENT:  Negative.    Eyes: Negative.   Respiratory: Negative.    Cardiovascular: Negative.   Gastrointestinal:  Negative for abdominal pain.  Genitourinary:  Negative for pelvic pain.   Musculoskeletal: Negative.   Skin: Negative.   Neurological: Negative.   Hematological: Negative.   Psychiatric/Behavioral: Negative.      Physical Exam:  height is 5' 3.78" (1.62 m) and weight is 110 lb (49.9 kg). Her oral temperature is 97.7 F (36.5 C). Her blood pressure is 136/66 and her pulse is 73. Her respiration is 18  and oxygen saturation is 100%.   Wt Readings from Last 3 Encounters:  01/12/23 110 lb (49.9 kg)  11/24/22 109 lb (49.4 kg)  10/31/22 110 lb (49.9 kg)    Physical Exam Vitals reviewed.  HENT:     Head: Normocephalic and atraumatic.  Eyes:     Pupils: Pupils are equal, round, and reactive to light.  Cardiovascular:     Rate and Rhythm: Normal rate and regular rhythm.     Heart sounds: Normal heart sounds.  Pulmonary:     Effort: Pulmonary effort is normal.     Breath sounds: Normal breath sounds.  Chest:    Abdominal:     General: Bowel sounds are normal.     Palpations: Abdomen is soft.     Comments: Abdominal exam shows a colostomy in the right lower quadrant.  There is a well-healed laparotomy scar.  She has no fluid wave.  There is no palpable abdominal mass.  There is no palpable liver or spleen tip.  Is scantly distended  Musculoskeletal:        General: No tenderness or deformity. Normal range of motion.     Cervical back: Normal range of motion.  Lymphadenopathy:     Cervical: No cervical adenopathy.  Skin:    General: Skin is warm and dry.     Findings: No erythema or rash.  Neurological:     Mental Status: She is alert and oriented to person, place, and time.  Psychiatric:        Behavior: Behavior normal.        Thought Content: Thought content normal.        Judgment: Judgment normal.     Lab Results  Component Value Date   WBC 4.6 01/12/2023   HGB 10.5 (L) 01/12/2023   HCT 31.3 (L) 01/12/2023   MCV 105.7 (H) 01/12/2023   PLT 200 01/12/2023     Chemistry      Component Value Date/Time   NA 138 11/24/2022 1220   K 3.8 11/24/2022 1220   CL 102 11/24/2022 1220   CO2 27 11/24/2022 1220   BUN 24 (H) 11/24/2022 1220   CREATININE 1.18 (H) 11/24/2022 1220   CREATININE 0.93 11/03/2021 0000      Component Value Date/Time   CALCIUM 9.8 11/24/2022 1220   ALKPHOS 45 11/24/2022 1220   AST 20 11/24/2022 1220   ALT 17 11/24/2022 1220   BILITOT 0.6  11/24/2022 1220        Impression and Plan: Maria Romero is a very charming 72 year old white female.  She had neoadjuvant chemotherapy.  She really had a tough time with neoadjuvant chemotherapy.  She subsequently underwent surgical resection.  She did  still have active disease.    We then gave her some additional chemotherapy.  By her CA-125, she was then remission.  I think that her colostomy reversal certainly confirms that she was in remission.  The CT scans certainly are very reassuring for me.  I do not think we need another set of scans probably until December.  I am just happy that she is tolerating the olaparib.  Again, we will not make any dosage adjustments for her.  We will plan to get her back in 6 more weeks.  Her magnesium is slowly coming up.  Hopefully, he will continue to,.  We will give her a dose of magnesium sulfate today.   Josph Macho, MD 10/3/202410:45 AM

## 2023-01-12 NOTE — Patient Instructions (Signed)
Hypomagnesemia Hypomagnesemia is a condition in which the level of magnesium in the blood is too low. Magnesium is a mineral that is found in many foods. It is used in many different processes in the body. Hypomagnesemia can affect every organ in the body. In severe cases, it can cause life-threatening problems. What are the causes? This condition may be caused by: Not getting enough magnesium in your diet or not having enough healthy foods to eat (malnutrition). Problems with magnesium absorption in the intestines. Dehydration. Excessive use of alcohol. Vomiting. Severe or long-term (chronic) diarrhea. Some medicines, including medicines that make you urinate more often (diuretics). Certain diseases, such as kidney disease, diabetes, celiac disease, and overactive thyroid. What are the signs or symptoms? Symptoms of this condition include: Loss of appetite, nausea, and vomiting. Involuntary shaking or trembling of a body part (tremor). Muscle weakness or tingling in the arms and legs. Sudden tightening of muscles (muscle spasms). Confusion. Psychiatric issues, such as: Depression and irritability. Psychosis. A feeling of fluttering of the heart (palpitations). Seizures. These symptoms are more severe if magnesium levels drop suddenly. How is this diagnosed? This condition may be diagnosed based on: Your symptoms and medical history. A physical exam. Blood and urine tests. How is this treated? Treatment depends on the cause and the severity of the condition. It may be treated by: Taking a magnesium supplement. This can be taken in pill form. If the condition is severe, magnesium is usually given through an IV. Making changes to your diet. You may be directed to eat foods that have a lot of magnesium, such as green leafy vegetables, peas, beans, and nuts. Not drinking alcohol. If you are struggling not to drink, ask your health care provider for help. Follow these instructions at  home: Eating and drinking     Make sure that your diet includes foods with magnesium. Foods that have a lot of magnesium in them include: Green leafy vegetables, such as spinach and broccoli. Beans and peas. Nuts and seeds, such as almonds and sunflower seeds. Whole grains, such as whole grain bread and fortified cereals. Drink fluids that contain salts and minerals (electrolytes), such as sports drinks, when you are active. Do not drink alcohol. General instructions Take over-the-counter and prescription medicines only as told by your health care provider. Take magnesium supplements as directed if your health care provider tells you to take them. Have your magnesium levels monitored as told by your health care provider. Keep all follow-up visits. This is important. Contact a health care provider if: You get worse instead of better. Your symptoms return. Get help right away if: You develop severe muscle weakness. You have trouble breathing. You feel that your heart is racing. These symptoms may represent a serious problem that is an emergency. Do not wait to see if the symptoms will go away. Get medical help right away. Call your local emergency services (911 in the [place] Do not drive yourself to the hospital. Summary Hypomagnesemia is a condition in which the level of magnesium in the blood is too low. Hypomagnesemia can affect every organ in the body. Treatment may include eating more foods that contain magnesium, taking magnesium supplements, and not drinking alcohol. Have your magnesium levels monitored as told by your health care provider. This information is not intended to replace advice given to you by your health care provider. Make sure you discuss any questions you have with your health care provider. Document Revised: 08/25/2020 Document Reviewed: 08/25/2020 Elsevier Patient Education    2024 Elsevier Inc.  

## 2023-01-12 NOTE — Patient Instructions (Signed)

## 2023-01-13 LAB — CA 125: Cancer Antigen (CA) 125: 12.5 U/mL (ref 0.0–38.1)

## 2023-01-23 ENCOUNTER — Other Ambulatory Visit: Payer: Self-pay | Admitting: Hematology & Oncology

## 2023-01-27 ENCOUNTER — Other Ambulatory Visit: Payer: Self-pay | Admitting: Family Medicine

## 2023-01-27 DIAGNOSIS — E7800 Pure hypercholesterolemia, unspecified: Secondary | ICD-10-CM

## 2023-01-30 ENCOUNTER — Encounter: Payer: Self-pay | Admitting: Gynecologic Oncology

## 2023-02-01 NOTE — Progress Notes (Unsigned)
Gynecologic Oncology Return Clinic Visit  02/02/23  Reason for Visit: follow-up  Treatment History: Oncology History  [name], right (HCC)  11/15/2021 Imaging   CT A/P: 1. Large volume ascites with peritoneal thickening and omental caking in the left abdomen. Imaging features are consistent with metastatic disease. 2. Contiguous with the posterior uterine fundus and extending into the right adnexal space is a poorly marginated heterogeneous 9.6 x 8.8 x 9.4 cm mass. Imaging features are consistent with neoplasm likely of right ovarian etiology. Associated heterogeneously enhancing mass involving the posterior lower uterine segment and cervix extending posteriorly towards the cul-de-sac. This may be a drop metastasis. 3. Soft tissue fullness also noted left adnexal region without normal left ovary by CT. 4. Subtle irregularity of liver contour raising the question of but not definitive for cirrhosis. 5. Small left and tiny right pleural effusions. 6. Aortic Atherosclerosis (ICD10-I70.0).   11/25/2021 Imaging   CT chest 1. Bilateral pleural effusions greatest on the LEFT both small volume but with fissural nodularity along the major fissure in the LEFT chest. Early involvement in the chest is not excluded. Sampling of pleural fluid may be helpful. 2. No adenopathy by size criteria. Tiny pulmonary nodule in the LEFT lower lobe as described. 3. Three-vessel coronary artery disease greatest in LEFT coronary circulation. 4. Reduction in malignant ascites since previous imaging following paracentesis.   12/01/2021 Initial Diagnosis   [name], right (HCC)   12/01/2021 Cancer Staging   Staging form: Ovary, Fallopian Tube, and Primary Peritoneal Carcinoma, AJCC 8th Edition - Clinical stage from 12/01/2021: FIGO Stage IIIC (cT3c, cN1b, cM0) - Signed by Josph Macho, MD on 12/01/2021 Stage prefix: Initial diagnosis Histologic grade (G): G2 Histologic grading system: 4 grade  system   12/07/2021 - 06/29/2022 Chemotherapy   Patient is on Treatment Plan : OVARIAN Carboplatin (AUC 6) + Paclitaxel (175) q21d X 6 Cycles     01/11/2022 - 06/08/2022 Chemotherapy   Patient is on Treatment Plan : OVARIAN Bevacizumab q21d      Genetic Testing   Negative genetic testing. No pathogenic variants identified on the [facility] Common Hereditary Cancers+RNA panel. The report date is 01/24/2022.  The Common Hereditary Cancers Panel + RNA offered by [facility] includes sequencing and/or deletion duplication testing of the following 47 genes: APC, ATM, AXIN2, BARD1, BMPR1A, BRCA1, BRCA2, BRIP1, CDH1, CDKN2A (p14ARF), CDKN2A (p16INK4a), CKD4, CHEK2, CTNNA1, DICER1, EPCAM (Deletion/duplication testing only), GREM1 (promoter region deletion/duplication testing only), KIT, MEN1, MLH1, MSH2, MSH3, MSH6, MUTYH, NBN, NF1, NHTL1, PALB2, PDGFRA, PMS2, POLD1, POLE, PTEN, RAD50, RAD51C, RAD51D, SDHB, SDHC, SDHD, SMAD4, SMARCA4. STK11, TP53, TSC1, TSC2, and VHL.  The following genes were evaluated for sequence changes only: SDHA and HOXB13 c.251G>A variant only.   03/08/2022 Imaging   CT C/A/P: 1. Reduction in the omental caking of tumor compared to 11/15/2021, although substantial tumor remains. 2. The right adnexal mass has a volume of 220 cubic cm and appears to be primarily solid, although components may be hemorrhagic. Previous ascites has convinced into a loculated collection of fluid surrounding this adnexal mass and measuring 1400 cc. 3. Questionable bowel wall thickening in the transverse colon, although much of the appearance may be due to nondistention. Correlate with any signs of colitis. 4. Previous right pleural effusion has resolved. Residual small left pleural effusion is nonspecific for transudative or exudative etiology. 5. Other imaging findings of potential clinical significance: Substantial left anterior descending coronary artery atherosclerosis. Grade 1 degenerative  anterolisthesis L4-5. Degenerative facet arthropathy at L4-5  and L5-S1. Old healed right posterior rib fractures. Small calcifications along the pleural margin of the left hemidiaphragm, possibly from prior asbestos exposure or prior pleurodesis. 6. Aortic atherosclerosis.   04/20/2022 Surgery   Diagnostic laparoscopy, exploratory laparotomy with lysis of adhesions for approximately 45 minutes, omentectomy including mobilization of the splenic flexure, radical tumor debulking with en bloc resection of bilateral adnexa, uterus, cervix and rectosigmoid colon, end-to-end colonic reanastomosis, diverting loop ileostomy, cystoscopy   Findings: On EUA, fixed pelvic mass with nodularity appreciated within the posterior cul-de-sac.  On intra-abdominal entry the laparoscope, upper abdominal adhesions limiting assessment of the left upper quadrant.  Large, cystic mass fills the mid abdomen.  On laparotomy, approximately 22 cm cystic mass spans from the deep pelvis to the upper abdomen with a rind.  This lesion appears to be walling off the intra-abdominal ascites.  Multiple loops of small bowel are adherent to the cystic lesion.  There is significant filmy adhesive disease between loops of small bowel, the bowel and cystic lesion, and the small bowel and anterior abdominal wall.  Also numerous less than 1 cm white nodules noted along the small bowel and mesentery (frequently within filmy adhesions), most consistent with treated tumor.  During lysis of adhesions, 2 areas of serosal injury noted along the ileum, oversewn.  Approximately 4-6 cm tumor implant within the greater omentum and omental nodularity extending along the omentum to the splenic flexure.  Lesser sac without disease.  Stomach normal in appearance.,  Adhesions between the liver and the anterior abdominal wall bilaterally, no nodularity appreciated along either the liver or diaphragm.  Appendix itself normal-appearing, appendiceal mesentery minimally  adherent to the right adnexa.  Normal cecum, ascending colon, transverse colon, and descending colon.  Large cystic mass noted to be adherent to the bladder peritoneum and uterus anteriorly within the pelvis.  Left adnexa is somewhat distorted.  Right ovary replaced by an 8 cm cystic mass.  Normal-appearing distal right fallopian fimbria.  Area of the sigmoid colon adherent posterior to the right adnexa, requiring an en bloc resection.  Somewhat woody texture palpated along the rectovaginal septum after reverse hysterectomy performed.  Negative bubble test after rectosigmoid anastomosis.  In the setting of planned additional treatment and recent receipt of bevacizumab, decision made to perform diverting loop ileostomy. Multiple adhesions and nodular implants sent.  If these are negative for malignancy or show treated tumor, then this was an R0 resection.  Otherwise, R1 resection. On cystoscopy, bladder dome intact. Good efflux seen from bilateral ureteral orifices.    04/20/2022 Pathology Results   A. SMALL BOWEL ADHESION, EXCISION: -Fibrous tissue with chronic inflammation, consistent with adhesions  B. SMALL BOWEL NODULE, EXCISION: - Benign fibrotic nodule - Negative for carcinoma  C. UTERUS, CERVIX, BILATERAL TUBES AND OVARIES, RECTOSIGMOID COLON, RESECTION: - Cervix: Benign, nabothian cyst - Endometrium: Benign inactive endometrium - Myometrium: No significant pathologic changes - Ovary: High-grade serous carcinoma - Colon: Metastatic serous carcinoma involving colonic serosa - See oncology table  D. OMENTUM, EXCISION: - Involved by high-grade serous carcinoma  E. RECTAL DONUTS, EXCISION: - Benign colonic mucosa with no significant pathologic changes   ONCOLOGY TABLE:  OVARY or FALLOPIAN TUBE or PRIMARY PERITONEUM: Resection  Procedure: Total hysterectomy, rectosigmoid colon resection, omental excision, adhesiolysis Specimen Integrity: Disrupted Tumor Site: Ovary Tumor Size:  10.5 x 7.5 x 5 cm Histologic Type: serous carcinoma Histologic Grade: High-grade Ovarian Surface Involvement: See comment Fallopian Tube Surface Involvement: Not identified Implants (required for advanced stage serous/seromucinous borderline tumors  only): Invasive implants Other Tissue/ Organ Involvement: Colonic serosa Largest Extrapelvic Peritoneal Focus: 8 cm (omental implant) Peritoneal/Ascitic Fluid Involvement: Present Chemotherapy Response Score (CRS): CRS2 Regional Lymph Nodes: Not applicable       Distant Metastasis:      Distant Site(s) Involved: None Pathologic Stage Classification (pTNM, AJCC 8th Edition): pT3c, pN[not assigned] Ancillary Studies: Can be performed upon request Representative Tumor Block: C11 Comment(s): Assessment of ovarian surface involvement is limited by disrupted and necrotic nature of the specimen. The tumor is located in the right ovary per the surgeon, an additional ovary and fallopian tube are not identified. An additional section is submitted to identify possible fallopian tube.      Interval History: Doing well.  Tolerating PARPi at decreased dosing in the evening.  Continues to have baseline constipation, uses Senokot as needed.  Denies any urinary symptoms or vaginal bleeding.  Denies any abdominal or pelvic pain.  On her left hand, in the last week, has noticed some limited mobility of her thumb and a click when she tries to bend her thumb.  Has some associated pain here.  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   Osteopenia    Osteoporosis    [name], right (HCC)  12/01/2021   Ovarian mass, right 11/19/2021   PONV (postoperative nausea and vomiting)    Toe fracture    lt small toe    Past Surgical History:  Procedure Laterality Date   BOWEL RESECTION N/A 04/20/2022   Procedure: BOWEL RESECTION; DIVERTING ILEOSTOMY;  Surgeon: Carver Fila, MD;  Location: [facility];  Service: Gynecology;  Laterality: N/A;   DEBULKING N/A 04/20/2022   Procedure: TUMOR DEBULKING, OMENTECTOMY;  Surgeon: Carver Fila, MD;  Location: [facility];  Service: Gynecology;  Laterality: N/A;   HYSTERECTOMY ABDOMINAL WITH SALPINGO-OOPHORECTOMY Bilateral 04/20/2022   Procedure: HYSTERECTOMY ABDOMINAL WITH BILATERAL SALPINGO-OOPHORECTOMY;  Surgeon: Carver Fila, MD;  Location: [facility];  Service: Gynecology;  Laterality: Bilateral;   ILEOSTOMY CLOSURE N/A 09/07/2022   Procedure: ILEOSTOMY TAKEDOWN;  Surgeon: Carver Fila, MD;  Location: [facility];  Service: General;  Laterality: N/A;   IR IMAGING GUIDED PORT INSERTION  11/26/2021   IR PARACENTESIS  11/23/2021   IR PARACENTESIS  11/26/2021   LAPAROSCOPY N/A 04/20/2022   Procedure: LAPAROSCOPY DIAGNOSTIC,CYSTO;  Surgeon: Carver Fila, MD;  Location: [facility];  Service: Gynecology;  Laterality: N/A;   lump removal  04/11/1993   axilla right - benign   TONSILLECTOMY     TUBAL LIGATION  04/11/1988    Family History  Problem Relation Age of Onset   Stroke Mother    Hypertension Mother    Hyperlipidemia Mother    Glaucoma Mother    Heart attack Father 37   Diabetes Father    Hypertension Father    Hyperlipidemia Father    Cataracts Father    Breast cancer Cousin    Colon cancer Neg Hx    Ovarian cancer Neg Hx    Endometrial cancer Neg Hx    Pancreatic cancer Neg Hx    Prostate cancer Neg Hx  Social History   Socioeconomic History   Marital status: Widowed    Spouse name: Not on file   Number of children: 1   Years of education: 14   Highest education level: Bachelor's degree (e.g., BA, AB, BS)   Occupational History   Occupation: retired Designer, industrial/product  Tobacco Use   Smoking status: Former    Current packs/day: 0.00    Average packs/day: 1.5 packs/day for 20.0 years (30.0 ttl pk-yrs)    Types: Cigarettes    Start date: 03/20/1970    Quit date: 03/20/1990    Years since quitting: 32.8   Smokeless tobacco: Never   Tobacco comments:    quit 1988  Vaping Use   Vaping status: Never Used  Substance and Sexual Activity   Alcohol use: Not Currently    Alcohol/week: 2.0 standard drinks of alcohol    Types: 2 Glasses of wine per week    Comment: Wine in the evenings   Drug use: No   Sexual activity: Not Currently    Partners: Male  Other Topics Concern   Not on file  Social History Narrative   Lives alone with her dog. Helps with her grandson pick up and volunteering at his school. She enjoys spending time with her family.   Social Determinants of Health   Financial Resource Strain: Low Risk  (09/30/2022)   Overall Financial Resource Strain (CARDIA)    Difficulty of Paying Living Expenses: Not hard at all  Food Insecurity: No Food Insecurity (09/30/2022)   Hunger Vital Sign    Worried About Running Out of Food in the Last Year: Never true    Ran Out of Food in the Last Year: Never true  Transportation Needs: No Transportation Needs (09/30/2022)   PRAPARE - Administrator, Civil Service (Medical): No    Lack of Transportation (Non-Medical): No  Physical Activity: Unknown (09/30/2022)   Exercise Vital Sign    Days of Exercise per Week: 0 days    Minutes of Exercise per Session: Not on file  Stress: No Stress Concern Present (09/30/2022)   [employer] of Occupational Health - Occupational Stress Questionnaire    Feeling of Stress : Only a little  Social Connections: Socially Isolated (09/30/2022)   Social Connection and Isolation Panel [NHANES]    Frequency of Communication with Friends and Family: More than three times a week    Frequency of Social Gatherings  with Friends and Family: More than three times a week    Attends Religious Services: Never    Database administrator or Organizations: No    Attends Banker Meetings: Not on file    Marital Status: Widowed    Current Medications:  Current Outpatient Medications:    amLODipine (NORVASC) 2.5 MG tablet, Take 1 tablet (2.5 mg total) by mouth daily., Disp: 90 tablet, Rfl: 3   Biotin 44010 MCG TABS, Take 10,000 mcg by mouth daily., Disp: , Rfl:    Calcium-Vitamin D-Vitamin K (VIACTIV PO), Take 1 tablet by mouth in the morning and at bedtime., Disp: , Rfl:    cholecalciferol (VITAMIN D3) 25 MCG (1000 UNIT) tablet, Take 1,000 Units by mouth daily., Disp: , Rfl:    lidocaine-prilocaine (EMLA) cream, Apply 1 Application topically as needed (for port access)., Disp: , Rfl:    magnesium oxide (MAG-OX) 400 (240 Mg) MG tablet, Take 1 tablet (400 mg total) by mouth 2 (two) times daily., Disp: 60 tablet, Rfl: 5   olaparib (LYNPARZA) 150  MG tablet, Take 2 tablets (300 mg total) by mouth 2 (two) times daily. Take 2 tablets (300 mg total) by mouth 2 (two) times daily. Swallow whole. May take with food to decrease nausea and vomiting. Take as instructed per MD (Patient taking differently: Take 300 mg by mouth 2 (two) times daily. Take 2 tablets (300 mg total) in morning 1 tablet at night. Swallow whole. May take with food to decrease nausea and vomiting. Take as instructed per MD), Disp: 120 tablet, Rfl: 3   omeprazole (PRILOSEC) 20 MG capsule, Take 1 capsule (20 mg total) by mouth daily., Disp: 90 capsule, Rfl: 2   ondansetron (ZOFRAN) 8 MG tablet, Take 1 tablet (8 mg total) by mouth every 8 (eight) hours as needed for nausea or vomiting., Disp: 20 tablet, Rfl: 0   TYLENOL 500 MG tablet, Take 500-1,000 mg by mouth every 6 (six) hours as needed for mild pain or headache., Disp: , Rfl:    atorvastatin (LIPITOR) 40 MG tablet, TAKE ONE TABLET BY MOUTH EVERY NIGHT AT BEDTIME, Disp: 90 tablet, Rfl:  3  Review of Systems: + Ringing in ears, sciatic nerve pain and left leg Denies appetite changes, fevers, chills, fatigue, unexplained weight changes. Denies hearing loss, neck lumps or masses, mouth sores or voice changes. Denies cough or wheezing.  Denies shortness of breath. Denies chest pain or palpitations. Denies leg swelling. Denies abdominal distention, pain, blood in stools, constipation, diarrhea, nausea, vomiting, or early satiety. Denies pain with intercourse, dysuria, frequency, hematuria or incontinence. Denies hot flashes, pelvic pain, vaginal bleeding or vaginal discharge.   Denies joint pain, back pain or muscle pain/cramps. Denies itching, rash, or wounds. Denies dizziness, headaches or seizures. Denies swollen lymph nodes or glands, denies easy bruising or bleeding. Denies anxiety, depression, confusion, or decreased concentration.  Physical Exam: BP 112/61 (BP Location: Right Arm, Patient Position: Sitting)   Pulse 67   Temp 97.9 F (36.6 C) (Oral)   Resp 20   Wt 108 lb 3.2 oz (49.1 kg)   SpO2 100%   BMI 18.70 kg/m  General: Alert, oriented, no acute distress. HEENT: Normocephalic, atraumatic, sclera anicteric. Chest: Unlabored breathing on room air.  No wheezes or rhonchi.  Lungs clear to auscultation bilaterally. Cardiovascular: Regular rate and rhythm, no murmurs or rubs appreciated. Abdomen: Soft, nondistended, nontender.  Well-healed incisions.  Normoactive bowel sounds. Skin: No rashes or lesions noted. Lymph nodes: No submandibular, supraclavicular, or inguinal adenopathy. Extremities: Grossly normal range of motion.  Warm, well perfused.  No edema bilaterally.  Patient has some limited mobility of the thumb of her left hand.  With flexion of the joint, there is a clicking noise. GU: Vaginal mucosa is mildly atrophic, vaginal cuff intact without lesions noted.  On bimanual exam, no masses or nodularity.  This is confirmed on rectovaginal  exam.  Laboratory & Radiologic Studies: Component Ref Range & Units 2 wk ago (01/12/23) 2 mo ago (11/24/22) 3 mo ago (10/31/22) 4 mo ago (10/03/22) 5 mo ago (08/17/22) 7 mo ago (06/29/22) 7 mo ago (06/08/22)  Cancer Antigen (CA) 125 0.0 - 38.1 U/mL 12.5 11.9 CM 13.4 CM 12.5 CM 12.2 CM 15.3 CM 20.4 CM   Assessment & Plan: Maria Romero is a 72 y.o. 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 Ileostomy was reversed in early 09/2022. Germline testing negative. HRD+. Maintenance Olaparib starting 09/2022.   Patient  is doing very well and is NED on exam today.  Recent Ca1 25 was normal.  Discussed recent left hand symptoms.  I encouraged her to reach out to her primary care provider about this.  She may need referral to orthopedics.  I placed an order for an x-ray of her left hand today.  She will continue to follow with her medical oncologist closely.  I will plan to see her back in 4 months.  Discussed signs and symptoms that should prompt a phone call between visits.  20 minutes of total time was spent for this patient encounter, including preparation, face-to-face counseling with the patient and coordination of care, and documentation of the encounter.  Eugene Garnet, MD  Division of Gynecologic Oncology  Department of Obstetrics and Gynecology  [facility]

## 2023-02-02 ENCOUNTER — Other Ambulatory Visit: Payer: Medicare Other

## 2023-02-02 ENCOUNTER — Encounter: Payer: Self-pay | Admitting: Gynecologic Oncology

## 2023-02-02 ENCOUNTER — Inpatient Hospital Stay (HOSPITAL_BASED_OUTPATIENT_CLINIC_OR_DEPARTMENT_OTHER): Payer: Medicare Other | Admitting: Gynecologic Oncology

## 2023-02-02 VITALS — BP 112/61 | HR 67 | Temp 97.9°F | Resp 20 | Wt 108.2 lb

## 2023-02-02 DIAGNOSIS — Z79899 Other long term (current) drug therapy: Secondary | ICD-10-CM | POA: Diagnosis not present

## 2023-02-02 DIAGNOSIS — C561 Malignant neoplasm of right ovary: Secondary | ICD-10-CM

## 2023-02-02 DIAGNOSIS — Z933 Colostomy status: Secondary | ICD-10-CM | POA: Diagnosis not present

## 2023-02-02 DIAGNOSIS — M79642 Pain in left hand: Secondary | ICD-10-CM

## 2023-02-02 DIAGNOSIS — Z9221 Personal history of antineoplastic chemotherapy: Secondary | ICD-10-CM

## 2023-02-02 DIAGNOSIS — C569 Malignant neoplasm of unspecified ovary: Secondary | ICD-10-CM

## 2023-02-02 NOTE — Patient Instructions (Signed)
It was good to see you today.  I do not see or feel any evidence of cancer recurrence on your exam.  I will see you for follow-up in 4 months.  As always, if you develop any new and concerning symptoms before your next visit, please call to see me sooner.  

## 2023-02-03 ENCOUNTER — Ambulatory Visit: Payer: Medicare Other

## 2023-02-03 DIAGNOSIS — M1812 Unilateral primary osteoarthritis of first carpometacarpal joint, left hand: Secondary | ICD-10-CM | POA: Diagnosis not present

## 2023-02-03 DIAGNOSIS — M79642 Pain in left hand: Secondary | ICD-10-CM | POA: Diagnosis not present

## 2023-02-23 ENCOUNTER — Inpatient Hospital Stay: Payer: Medicare Other | Admitting: Hematology & Oncology

## 2023-02-23 ENCOUNTER — Inpatient Hospital Stay: Payer: Medicare Other

## 2023-02-23 ENCOUNTER — Encounter: Payer: Self-pay | Admitting: Hematology & Oncology

## 2023-02-23 ENCOUNTER — Inpatient Hospital Stay: Payer: Medicare Other | Attending: Hematology & Oncology

## 2023-02-23 VITALS — BP 124/72 | HR 70 | Temp 97.7°F | Resp 18 | Ht 63.0 in | Wt 108.8 lb

## 2023-02-23 DIAGNOSIS — C561 Malignant neoplasm of right ovary: Secondary | ICD-10-CM

## 2023-02-23 DIAGNOSIS — Z9221 Personal history of antineoplastic chemotherapy: Secondary | ICD-10-CM | POA: Diagnosis not present

## 2023-02-23 DIAGNOSIS — Z95828 Presence of other vascular implants and grafts: Secondary | ICD-10-CM

## 2023-02-23 LAB — CMP (CANCER CENTER ONLY)
ALT: 14 U/L (ref 0–44)
AST: 18 U/L (ref 15–41)
Albumin: 4.5 g/dL (ref 3.5–5.0)
Alkaline Phosphatase: 46 U/L (ref 38–126)
Anion gap: 8 (ref 5–15)
BUN: 19 mg/dL (ref 8–23)
CO2: 28 mmol/L (ref 22–32)
Calcium: 10.1 mg/dL (ref 8.9–10.3)
Chloride: 104 mmol/L (ref 98–111)
Creatinine: 1.14 mg/dL — ABNORMAL HIGH (ref 0.44–1.00)
GFR, Estimated: 51 mL/min — ABNORMAL LOW (ref >60)
Glucose, Bld: 130 mg/dL — ABNORMAL HIGH (ref 70–99)
Potassium: 3.9 mmol/L (ref 3.5–5.1)
Sodium: 140 mmol/L (ref 135–145)
Total Bilirubin: 0.7 mg/dL (ref <1.2)
Total Protein: 6.4 g/dL — ABNORMAL LOW (ref 6.5–8.1)

## 2023-02-23 LAB — IRON AND IRON BINDING CAPACITY (CC-WL,HP ONLY)
Iron: 106 ug/dL (ref 28–170)
Saturation Ratios: 36 % — ABNORMAL HIGH (ref 10.4–31.8)
TIBC: 298 ug/dL (ref 250–450)
UIBC: 192 ug/dL (ref 148–442)

## 2023-02-23 LAB — CBC WITH DIFFERENTIAL (CANCER CENTER ONLY)
Abs Immature Granulocytes: 0.02 10*3/uL (ref 0.00–0.07)
Basophils Absolute: 0 10*3/uL (ref 0.0–0.1)
Basophils Relative: 1 %
Eosinophils Absolute: 0 10*3/uL (ref 0.0–0.5)
Eosinophils Relative: 1 %
HCT: 32.5 % — ABNORMAL LOW (ref 36.0–46.0)
Hemoglobin: 11.1 g/dL — ABNORMAL LOW (ref 12.0–15.0)
Immature Granulocytes: 1 %
Lymphocytes Relative: 24 %
Lymphs Abs: 0.9 10*3/uL (ref 0.7–4.0)
MCH: 38 pg — ABNORMAL HIGH (ref 26.0–34.0)
MCHC: 34.2 g/dL (ref 30.0–36.0)
MCV: 111.3 fL — ABNORMAL HIGH (ref 80.0–100.0)
Monocytes Absolute: 0.3 10*3/uL (ref 0.1–1.0)
Monocytes Relative: 8 %
Neutro Abs: 2.5 10*3/uL (ref 1.7–7.7)
Neutrophils Relative %: 65 %
Platelet Count: 192 10*3/uL (ref 150–400)
RBC: 2.92 MIL/uL — ABNORMAL LOW (ref 3.87–5.11)
RDW: 14.6 % (ref 11.5–15.5)
WBC Count: 3.8 10*3/uL — ABNORMAL LOW (ref 4.0–10.5)
nRBC: 0 % (ref 0.0–0.2)

## 2023-02-23 LAB — MAGNESIUM: Magnesium: 1.5 mg/dL — ABNORMAL LOW (ref 1.7–2.4)

## 2023-02-23 LAB — FERRITIN: Ferritin: 136 ng/mL (ref 11–307)

## 2023-02-23 MED ORDER — HEPARIN SOD (PORK) LOCK FLUSH 100 UNIT/ML IV SOLN
500.0000 [IU] | Freq: Once | INTRAVENOUS | Status: AC
  Administered 2023-02-23: 500 [IU] via INTRAVENOUS

## 2023-02-23 MED ORDER — SODIUM CHLORIDE 0.9% FLUSH
10.0000 mL | Freq: Once | INTRAVENOUS | Status: AC
  Administered 2023-02-23: 10 mL

## 2023-02-23 NOTE — Patient Instructions (Signed)

## 2023-02-23 NOTE — Progress Notes (Signed)
Hematology and Oncology Follow Up Visit  Maria Romero [id number] January 12, 1951 72 y.o. 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 Rhyner is back for follow-up.  She is doing quite nicely.  She feels great.  She is having no problems with the olaparib.  Her last CA-125 was holding steady at 12.5.Marland Kitchen  She is eating well.  She is looking forward to Given.  She will be cooking for Thanksgiving..  She has had no change in bowel or bladder habits.  She has had no leg swelling.  She has had no rashes.  She has had no bleeding..  There is been no cough or shortness of breath.  Thankfully, she has avoided COVID.  She is tolerating the olaparib quite nicely.  I am not going to change her dosing right now.  Overall, I would say that her performance status is probably ECOG 0.       Wt Readings from Last 3 Encounters:  02/23/23 108 lb 12.8 oz (49.4 kg)  02/02/23 108 lb 3.2 oz (49.1 kg)  01/12/23 110 lb (49.9 kg)    Medications:  Current Outpatient Medications:    amLODipine (NORVASC) 2.5 MG tablet, Take 1 tablet (2.5 mg total) by mouth daily., Disp: 90 tablet, Rfl: 3   atorvastatin (LIPITOR) 40 MG tablet, TAKE ONE TABLET BY MOUTH EVERY NIGHT AT BEDTIME, Disp: 90 tablet, Rfl: 3   Biotin 47829 MCG TABS, Take 10,000 mcg by mouth daily., Disp: , Rfl:    Calcium-Vitamin D-Vitamin K (VIACTIV PO), Take 1 tablet by mouth in the morning and at bedtime., Disp: , Rfl:    cholecalciferol (VITAMIN D3) 25 MCG (1000 UNIT) tablet, Take 1,000 Units by mouth daily., Disp: , Rfl:     lidocaine-prilocaine (EMLA) cream, Apply 1 Application topically as needed (for port access)., Disp: , Rfl:    magnesium oxide (MAG-OX) 400 (240 Mg) MG tablet, Take 1 tablet (400 mg total) by mouth 2 (two) times daily., Disp: 60 tablet, Rfl: 5   olaparib (LYNPARZA) 150 MG tablet, Take 2 tablets (300 mg total) by mouth 2 (two) times daily. Take 2 tablets (300 mg total) by mouth 2 (two) times daily. Swallow whole. May take with food to decrease nausea and vomiting. Take as instructed per MD (Patient taking differently: Take 300 mg by mouth 2 (two) times daily. Take 2 tablets (300 mg total) in morning 1 tablet at night. Swallow whole. May take with food to decrease nausea and vomiting. Take as instructed per MD), Disp: 120 tablet, Rfl: 3   omeprazole (PRILOSEC) 20 MG capsule, Take 1 capsule (20 mg total) by mouth daily., Disp: 90 capsule, Rfl: 2   senna (SENOKOT) 8.6 MG tablet, Take 1 tablet by mouth daily., Disp: , Rfl:    TYLENOL 500 MG tablet, Take 500-1,000 mg by mouth every 6 (six) hours as needed for mild pain or headache., Disp: , Rfl:    ondansetron (ZOFRAN) 8 MG tablet, Take 1 tablet (8 mg total) by mouth every 8 (eight) hours as needed for nausea or vomiting. (Patient not taking: Reported on 02/23/2023),  Disp: 20 tablet, Rfl: 0 No current facility-administered medications for this visit.  Facility-Administered Medications Ordered in Other Visits:    heparin lock flush 100 unit/mL, 500 Units, Intravenous, Once, Saida Lonon, Rose Phi, MD   sodium chloride flush (NS) 0.9 % injection 10 mL, 10 mL, Intracatheter, Once, Simcha Speir, Rose Phi, MD  Allergies:  Allergies  Allergen Reactions   Fluvastatin Sodium Other (See Comments)    Myalgias   Sudafed [Pseudoephedrine Hcl]     Makes her feel "high"    Past Medical History, Surgical history, Social history, and Family History were reviewed and updated.  Review of Systems: Review of Systems  Constitutional: Negative.   HENT:  Negative.    Eyes:  Negative.   Respiratory: Negative.    Cardiovascular: Negative.   Gastrointestinal:  Negative for abdominal pain.  Genitourinary:  Negative for pelvic pain.   Musculoskeletal: Negative.   Skin: Negative.   Neurological: Negative.   Hematological: Negative.   Psychiatric/Behavioral: Negative.      Physical Exam:  height is 5\' 3"  (1.6 m) and weight is 108 lb 12.8 oz (49.4 kg). Her oral temperature is 97.7 F (36.5 C). Her blood pressure is 124/72 and her pulse is 70. Her respiration is 18 and oxygen saturation is 100%.   Wt Readings from Last 3 Encounters:  02/23/23 108 lb 12.8 oz (49.4 kg)  02/02/23 108 lb 3.2 oz (49.1 kg)  01/12/23 110 lb (49.9 kg)    Physical Exam Vitals reviewed.  HENT:     Head: Normocephalic and atraumatic.  Eyes:     Pupils: Pupils are equal, round, and reactive to light.  Cardiovascular:     Rate and Rhythm: Normal rate and regular rhythm.     Heart sounds: Normal heart sounds.  Pulmonary:     Effort: Pulmonary effort is normal.     Breath sounds: Normal breath sounds.  Chest:    Abdominal:     General: Bowel sounds are normal.     Palpations: Abdomen is soft.     Comments: Abdominal exam shows a colostomy in the right lower quadrant.  There is a well-healed laparotomy scar.  She has no fluid wave.  There is no palpable abdominal mass.  There is no palpable liver or spleen tip.  Is scantly distended  Musculoskeletal:        General: No tenderness or deformity. Normal range of motion.     Cervical back: Normal range of motion.  Lymphadenopathy:     Cervical: No cervical adenopathy.  Skin:    General: Skin is warm and dry.     Findings: No erythema or rash.  Neurological:     Mental Status: She is alert and oriented to person, place, and time.  Psychiatric:        Behavior: Behavior normal.        Thought Content: Thought content normal.        Judgment: Judgment normal.      Lab Results  Component Value Date   WBC 3.8 (L) 02/23/2023    HGB 11.1 (L) 02/23/2023   HCT 32.5 (L) 02/23/2023   MCV 111.3 (H) 02/23/2023   PLT 192 02/23/2023     Chemistry      Component Value Date/Time   NA 140 02/23/2023 0930   K 3.9 02/23/2023 0930   CL 104 02/23/2023 0930   CO2 28 02/23/2023 0930   BUN 19 02/23/2023 0930   CREATININE 1.14 (H) 02/23/2023 0930   CREATININE 0.93 11/03/2021 0000  Component Value Date/Time   CALCIUM 10.1 02/23/2023 0930   ALKPHOS 46 02/23/2023 0930   AST 18 02/23/2023 0930   ALT 14 02/23/2023 0930   BILITOT 0.7 02/23/2023 0930        Impression and Plan: Ms. Saner is a very charming 72 year old white female.  She had neoadjuvant chemotherapy.  She really had a tough time with neoadjuvant chemotherapy.  She subsequently underwent surgical resection.  She did still have active disease.    We then gave her some additional chemotherapy.  By her CA-125, she was then remission.  I think that her colostomy reversal certainly confirms that she was in remission.  Her next CT scan will be due in December.  We will get that set up for her.  I would like to see her back right after Christmas.  Hopefully, next year will be a quite year for her.    Josph Macho, MD 11/14/202410:46 AM

## 2023-02-24 ENCOUNTER — Encounter: Payer: Self-pay | Admitting: *Deleted

## 2023-02-24 ENCOUNTER — Ambulatory Visit (INDEPENDENT_AMBULATORY_CARE_PROVIDER_SITE_OTHER): Payer: Medicare Other | Admitting: Sports Medicine

## 2023-02-24 ENCOUNTER — Encounter: Payer: Self-pay | Admitting: Sports Medicine

## 2023-02-24 ENCOUNTER — Ambulatory Visit: Payer: Medicare Other

## 2023-02-24 DIAGNOSIS — M7542 Impingement syndrome of left shoulder: Secondary | ICD-10-CM

## 2023-02-24 DIAGNOSIS — M19012 Primary osteoarthritis, left shoulder: Secondary | ICD-10-CM | POA: Diagnosis not present

## 2023-02-24 DIAGNOSIS — M25512 Pain in left shoulder: Secondary | ICD-10-CM | POA: Diagnosis not present

## 2023-02-24 DIAGNOSIS — M65312 Trigger thumb, left thumb: Secondary | ICD-10-CM

## 2023-02-24 LAB — CA 125: Cancer Antigen (CA) 125: 11.1 U/mL (ref 0.0–38.1)

## 2023-02-24 MED ORDER — MELOXICAM 15 MG PO TABS: ORAL_TABLET | ORAL | 3 refills | Status: DC

## 2023-02-24 NOTE — Progress Notes (Signed)
    Procedures performed today:    None.  Independent interpretation of notes and tests performed by another provider:   None.  Brief History, Exam, Impression, and Recommendations:    Impingement syndrome, shoulder, left Increasing pain left deltoid, positive impingement sign's on exam. We discussed the anatomy and pathophysiology, adding meloxicam, x-rays, home physical therapy, return in 6 weeks, injection if not better.  Trigger thumb, left thumb Left trigger thumb, explained the anatomy, adding meloxicam, home PT, return to see me in 6 weeks, injection if not better.  I spent 30 minutes of total time managing this patient today, this includes chart review, face to face, and non-face to face time.  ____________________________________________ Ihor Austin. Benjamin Stain, M.D., ABFM., CAQSM., AME. Primary Care and Sports Medicine [facility]  Adjunct Professor of Family Medicine  [employer]  Restaurant manager, fast food

## 2023-02-24 NOTE — Assessment & Plan Note (Signed)
Left trigger thumb, explained the anatomy, adding meloxicam, home PT, return to see me in 6 weeks, injection if not better.

## 2023-02-24 NOTE — Assessment & Plan Note (Signed)
Increasing pain left deltoid, positive impingement sign's on exam. We discussed the anatomy and pathophysiology, adding meloxicam, x-rays, home physical therapy, return in 6 weeks, injection if not better.

## 2023-02-27 ENCOUNTER — Other Ambulatory Visit: Payer: Self-pay

## 2023-02-27 DIAGNOSIS — C561 Malignant neoplasm of right ovary: Secondary | ICD-10-CM

## 2023-02-27 MED ORDER — OLAPARIB 150 MG PO TABS: 300.0000 mg | ORAL_TABLET | ORAL | 3 refills | Status: DC

## 2023-03-01 ENCOUNTER — Telehealth: Payer: Self-pay

## 2023-03-01 NOTE — Telephone Encounter (Signed)
Oral Oncology Patient Advocate Encounter  Reached out and spoke with patient regarding PAP paperwork, explained that I would send it to their preferred email via DocuSign.   Confirmed email address: [email or web address]    Patient expressed understanding and consent.  Will follow up once paperwork has been signed and returned.   Ardeen Fillers, CPhT Oncology Pharmacy Patient Advocate  [facility]  [phone] (phone) [phone] (fax) 03/01/2023 11:12 AM

## 2023-03-01 NOTE — Telephone Encounter (Signed)
Oral Oncology Patient Advocate Encounter   Received notification that patient is due for re-enrollment for assistance for Lynparza through [employer] Prescription Savings Program.   Re-enrollment process has been initiated and will be submitted upon completion of necessary documents.  [employer]'s phone number [phone].   I will continue to follow until final determination.   Ardeen Fillers, CPhT Oncology Pharmacy Patient Advocate  [facility]  [phone] (phone) [phone] (fax) 03/01/2023 11:11 AM

## 2023-03-02 NOTE — Telephone Encounter (Signed)
Received back patient signature. I will submit once MD signature is in hand. I will continue to follow and update until final determination   Ardeen Fillers, CPhT Oncology Pharmacy Patient Advocate  [facility]  [phone] (phone) [phone] (fax) 03/02/2023 8:28 AM

## 2023-03-14 NOTE — Telephone Encounter (Signed)
Application sent to MD office for MD signature. MD office has been notified and I will submit once back in hand. I will continue to follow and update until final determination.    Ardeen Fillers, CPhT Oncology Pharmacy Patient Advocate  [facility]  [phone] (phone) [phone] (fax) 03/14/2023 11:02 AM

## 2023-03-16 NOTE — Telephone Encounter (Signed)
Oral Oncology Patient Advocate Encounter   Submitted application for assistance for Lynparza to [employer] Prescription Savings Program.   Application submitted via e-fax to [phone]   [employer]'s phone number [phone].   I will continue to check the status until final determination.    Ardeen Fillers, CPhT Oncology Pharmacy Patient Advocate  [facility]  [phone] (phone) [phone] (fax) 03/16/2023 12:52 PM

## 2023-03-27 ENCOUNTER — Ambulatory Visit (HOSPITAL_BASED_OUTPATIENT_CLINIC_OR_DEPARTMENT_OTHER)
Admission: RE | Admit: 2023-03-27 | Discharge: 2023-03-27 | Disposition: A | Discharge status: Home / Self Care | Payer: Medicare Other | Source: Ambulatory Visit | Attending: Hematology & Oncology | Admitting: Hematology & Oncology

## 2023-03-27 ENCOUNTER — Inpatient Hospital Stay: Payer: Medicare Other | Attending: Hematology & Oncology

## 2023-03-27 ENCOUNTER — Encounter (HOSPITAL_BASED_OUTPATIENT_CLINIC_OR_DEPARTMENT_OTHER): Payer: Self-pay

## 2023-03-27 DIAGNOSIS — Z90710 Acquired absence of both cervix and uterus: Secondary | ICD-10-CM | POA: Insufficient documentation

## 2023-03-27 DIAGNOSIS — Z90722 Acquired absence of ovaries, bilateral: Secondary | ICD-10-CM | POA: Insufficient documentation

## 2023-03-27 DIAGNOSIS — Z9221 Personal history of antineoplastic chemotherapy: Secondary | ICD-10-CM | POA: Diagnosis not present

## 2023-03-27 DIAGNOSIS — N281 Cyst of kidney, acquired: Secondary | ICD-10-CM | POA: Diagnosis not present

## 2023-03-27 DIAGNOSIS — Z95828 Presence of other vascular implants and grafts: Secondary | ICD-10-CM

## 2023-03-27 DIAGNOSIS — I700 Atherosclerosis of aorta: Secondary | ICD-10-CM | POA: Diagnosis not present

## 2023-03-27 DIAGNOSIS — K760 Fatty (change of) liver, not elsewhere classified: Secondary | ICD-10-CM | POA: Diagnosis not present

## 2023-03-27 DIAGNOSIS — R918 Other nonspecific abnormal finding of lung field: Secondary | ICD-10-CM | POA: Insufficient documentation

## 2023-03-27 DIAGNOSIS — C561 Malignant neoplasm of right ovary: Secondary | ICD-10-CM | POA: Insufficient documentation

## 2023-03-27 MED ORDER — HEPARIN SOD (PORK) LOCK FLUSH 100 UNIT/ML IV SOLN
500.0000 [IU] | Freq: Once | INTRAVENOUS | Status: AC
  Administered 2023-03-27: 500 [IU] via INTRAVENOUS

## 2023-03-27 MED ORDER — SODIUM CHLORIDE 0.9% FLUSH
10.0000 mL | INTRAVENOUS | Status: DC | PRN
  Administered 2023-03-27: 10 mL via INTRAVENOUS

## 2023-03-27 MED ORDER — IOHEXOL 300 MG/ML  SOLN
100.0000 mL | Freq: Once | INTRAMUSCULAR | Status: AC | PRN
  Administered 2023-03-27: 100 mL via INTRAVENOUS

## 2023-03-27 NOTE — Patient Instructions (Signed)

## 2023-04-07 ENCOUNTER — Encounter: Payer: Self-pay | Admitting: Hematology & Oncology

## 2023-04-07 ENCOUNTER — Other Ambulatory Visit: Payer: Self-pay

## 2023-04-07 ENCOUNTER — Inpatient Hospital Stay: Payer: Medicare Other

## 2023-04-07 ENCOUNTER — Inpatient Hospital Stay (HOSPITAL_BASED_OUTPATIENT_CLINIC_OR_DEPARTMENT_OTHER): Payer: Medicare Other | Admitting: Hematology & Oncology

## 2023-04-07 VITALS — BP 135/66 | HR 64 | Temp 97.8°F | Resp 18 | Ht 63.0 in | Wt 109.0 lb

## 2023-04-07 DIAGNOSIS — R918 Other nonspecific abnormal finding of lung field: Secondary | ICD-10-CM | POA: Diagnosis not present

## 2023-04-07 DIAGNOSIS — C561 Malignant neoplasm of right ovary: Secondary | ICD-10-CM

## 2023-04-07 DIAGNOSIS — I700 Atherosclerosis of aorta: Secondary | ICD-10-CM | POA: Diagnosis not present

## 2023-04-07 DIAGNOSIS — Z9221 Personal history of antineoplastic chemotherapy: Secondary | ICD-10-CM | POA: Diagnosis not present

## 2023-04-07 LAB — CMP (CANCER CENTER ONLY)
ALT: 13 U/L (ref 0–44)
AST: 17 U/L (ref 15–41)
Albumin: 4.2 g/dL (ref 3.5–5.0)
Alkaline Phosphatase: 47 U/L (ref 38–126)
Anion gap: 8 (ref 5–15)
BUN: 25 mg/dL — ABNORMAL HIGH (ref 8–23)
CO2: 28 mmol/L (ref 22–32)
Calcium: 10 mg/dL (ref 8.9–10.3)
Chloride: 104 mmol/L (ref 98–111)
Creatinine: 0.95 mg/dL (ref 0.44–1.00)
GFR, Estimated: >60 mL/min (ref >60)
Glucose, Bld: 88 mg/dL (ref 70–99)
Potassium: 4.1 mmol/L (ref 3.5–5.1)
Sodium: 140 mmol/L (ref 135–145)
Total Bilirubin: 0.5 mg/dL (ref <1.2)
Total Protein: 6 g/dL — ABNORMAL LOW (ref 6.5–8.1)

## 2023-04-07 LAB — CBC WITH DIFFERENTIAL (CANCER CENTER ONLY)
Abs Immature Granulocytes: 0.02 10*3/uL (ref 0.00–0.07)
Basophils Absolute: 0 10*3/uL (ref 0.0–0.1)
Basophils Relative: 0 %
Eosinophils Absolute: 0 10*3/uL (ref 0.0–0.5)
Eosinophils Relative: 1 %
HCT: 31.5 % — ABNORMAL LOW (ref 36.0–46.0)
Hemoglobin: 11 g/dL — ABNORMAL LOW (ref 12.0–15.0)
Immature Granulocytes: 0 %
Lymphocytes Relative: 20 %
Lymphs Abs: 0.9 10*3/uL (ref 0.7–4.0)
MCH: 38.6 pg — ABNORMAL HIGH (ref 26.0–34.0)
MCHC: 34.9 g/dL (ref 30.0–36.0)
MCV: 110.5 fL — ABNORMAL HIGH (ref 80.0–100.0)
Monocytes Absolute: 0.4 10*3/uL (ref 0.1–1.0)
Monocytes Relative: 8 %
Neutro Abs: 3.3 10*3/uL (ref 1.7–7.7)
Neutrophils Relative %: 71 %
Platelet Count: 205 10*3/uL (ref 150–400)
RBC: 2.85 MIL/uL — ABNORMAL LOW (ref 3.87–5.11)
RDW: 14 % (ref 11.5–15.5)
WBC Count: 4.7 10*3/uL (ref 4.0–10.5)
nRBC: 0 % (ref 0.0–0.2)

## 2023-04-07 LAB — LACTATE DEHYDROGENASE: LDH: 158 U/L (ref 98–192)

## 2023-04-07 LAB — MAGNESIUM: Magnesium: 1.4 mg/dL — ABNORMAL LOW (ref 1.7–2.4)

## 2023-04-07 NOTE — Progress Notes (Signed)
Hematology and Oncology Follow Up Visit  Maria Romero [id number] 11/19/1950 72 y.o. 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  She is doing quite nicely.  She feels great.  She is having no problems with the olaparib.  Her last CA-125 was holding steady at 11.1.    She did have a CT scan that was done.  CT scan was done on 03/27/2023.  The CT scan that show any evidence of recurrent disease.  Everything really looks fine on the CT scan.  I am just happy that she is doing well on the olaparib.  I think the current dose is appropriate for her.  She has had no issues with nausea or vomiting.  She has had no change in bowel or bladder habits.  There has been no diarrhea.  Her magnesium is low but on the lower side at 1.4 today.  I just do not think that we have to replace this with her.  She is eating well.  She had a wonderful Christmas.  She is looking forward to New Years.  She has had no leg swelling.  There is been no rashes.  Overall, I would say that her performance status is probably ECOG 0.    Wt Readings from Last 3 Encounters:  04/07/23 109 lb (49.4 kg)  02/23/23 108 lb 12.8 oz (49.4 kg)  02/02/23 108 lb 3.2 oz (49.1 kg)    Medications:  Current Outpatient Medications:    amLODipine (NORVASC) 2.5 MG tablet, Take 1 tablet (2.5 mg total) by mouth daily., Disp: 90 tablet, Rfl: 3   atorvastatin (LIPITOR) 40 MG tablet, TAKE ONE TABLET BY MOUTH EVERY NIGHT AT BEDTIME, Disp: 90 tablet, Rfl: 3   Biotin 96295 MCG TABS,  Take 10,000 mcg by mouth daily., Disp: , Rfl:    Calcium-Vitamin D-Vitamin K (VIACTIV PO), Take 1 tablet by mouth in the morning and at bedtime., Disp: , Rfl:    cholecalciferol (VITAMIN D3) 25 MCG (1000 UNIT) tablet, Take 1,000 Units by mouth daily., Disp: , Rfl:    lidocaine-prilocaine (EMLA) cream, Apply 1 Application topically as needed (for port access)., Disp: , Rfl:    magnesium oxide (MAG-OX) 400 (240 Mg) MG tablet, Take 1 tablet (400 mg total) by mouth 2 (two) times daily., Disp: 60 tablet, Rfl: 5   meloxicam (MOBIC) 15 MG tablet, One tab PO every 24 hours with a meal for 2 weeks, then once every 24 hours prn pain., Disp: 30 tablet, Rfl: 3   olaparib (LYNPARZA) 150 MG tablet, Take 2 tablets (300 mg total) by mouth See admin instructions. Take 2 tablets (300 mg total) in morning 1 tablet (150mg ) at night. Swallow whole. May take with food to decrease nausea and vomiting. Take as instructed per MD, Disp: 90 tablet, Rfl: 3   omeprazole (PRILOSEC) 20 MG capsule, Take 1 capsule (20 mg total) by mouth daily., Disp: 90 capsule, Rfl: 2   ondansetron (ZOFRAN) 8 MG tablet, Take 1 tablet (8 mg total) by mouth every 8 (eight) hours as needed for  nausea or vomiting., Disp: 20 tablet, Rfl: 0   senna (SENOKOT) 8.6 MG tablet, Take 1 tablet by mouth daily., Disp: , Rfl:    TYLENOL 500 MG tablet, Take 500-1,000 mg by mouth every 6 (six) hours as needed for mild pain or headache., Disp: , Rfl:   Allergies:  Allergies  Allergen Reactions   Fluvastatin Sodium Other (See Comments)    Myalgias   Sudafed [Pseudoephedrine Hcl]     Makes her feel "high"    Past Medical History, Surgical history, Social history, and Family History were reviewed and updated.  Review of Systems: Review of Systems  Constitutional: Negative.   HENT:  Negative.    Eyes: Negative.   Respiratory: Negative.    Cardiovascular: Negative.   Gastrointestinal:  Negative for abdominal pain.  Genitourinary:  Negative for pelvic pain.    Musculoskeletal: Negative.   Skin: Negative.   Neurological: Negative.   Hematological: Negative.   Psychiatric/Behavioral: Negative.      Physical Exam:  height is 5\' 3"  (1.6 m) and weight is 109 lb (49.4 kg). Her oral temperature is 97.8 F (36.6 C). Her blood pressure is 135/66 and her pulse is 64. Her respiration is 18 and oxygen saturation is 100%.   Wt Readings from Last 3 Encounters:  04/07/23 109 lb (49.4 kg)  02/23/23 108 lb 12.8 oz (49.4 kg)  02/02/23 108 lb 3.2 oz (49.1 kg)    Physical Exam Vitals reviewed.  HENT:     Head: Normocephalic and atraumatic.  Eyes:     Pupils: Pupils are equal, round, and reactive to light.  Cardiovascular:     Rate and Rhythm: Normal rate and regular rhythm.     Heart sounds: Normal heart sounds.  Pulmonary:     Effort: Pulmonary effort is normal.     Breath sounds: Normal breath sounds.  Chest:    Abdominal:     General: Bowel sounds are normal.     Palpations: Abdomen is soft.     Comments: Abdominal exam shows a colostomy in the right lower quadrant.  There is a well-healed laparotomy scar.  She has no fluid wave.  There is no palpable abdominal mass.  There is no palpable liver or spleen tip.  Is scantly distended  Musculoskeletal:        General: No tenderness or deformity. Normal range of motion.     Cervical back: Normal range of motion.  Lymphadenopathy:     Cervical: No cervical adenopathy.  Skin:    General: Skin is warm and dry.     Findings: No erythema or rash.  Neurological:     Mental Status: She is alert and oriented to person, place, and time.  Psychiatric:        Behavior: Behavior normal.        Thought Content: Thought content normal.        Judgment: Judgment normal.     Lab Results  Component Value Date   WBC 4.7 04/07/2023   HGB 11.0 (L) 04/07/2023   HCT 31.5 (L) 04/07/2023   MCV 110.5 (H) 04/07/2023   PLT 205 04/07/2023     Chemistry      Component Value Date/Time   NA 140 04/07/2023  1035   K 4.1 04/07/2023 1035   CL 104 04/07/2023 1035   CO2 28 04/07/2023 1035   BUN 25 (H) 04/07/2023 1035   CREATININE 0.95 04/07/2023 1035   CREATININE 0.93 11/03/2021 0000      Component Value  Date/Time   CALCIUM 10.0 04/07/2023 1035   ALKPHOS 47 04/07/2023 1035   AST 17 04/07/2023 1035   ALT 13 04/07/2023 1035   BILITOT 0.5 04/07/2023 1035        Impression and Plan: Ms. Bogucki is a very charming 72 year old white female.  She had neoadjuvant chemotherapy.  She really had a tough time with neoadjuvant chemotherapy.  She subsequently underwent surgical resection.  She did still have active disease.    We then gave her some additional chemotherapy.  By her CA-125, she was then remission.  I think that her colostomy reversal certainly confirms that she was in remission.  At this point, we will just follow her along.  I do not think we need to do any scans on her problem until the Spring.  Will plan to get her back in a couple months.   Josph Macho, MD 12/27/202411:17 AM

## 2023-04-07 NOTE — Patient Instructions (Signed)

## 2023-04-08 LAB — CA 125: Cancer Antigen (CA) 125: 14.4 U/mL (ref 0.0–38.1)

## 2023-04-10 ENCOUNTER — Ambulatory Visit: Payer: Medicare Other | Admitting: Hematology & Oncology

## 2023-04-10 ENCOUNTER — Other Ambulatory Visit: Payer: Medicare Other

## 2023-04-10 ENCOUNTER — Ambulatory Visit (INDEPENDENT_AMBULATORY_CARE_PROVIDER_SITE_OTHER): Payer: Medicare Other | Admitting: Family Medicine

## 2023-04-10 ENCOUNTER — Encounter: Payer: Self-pay | Admitting: Family Medicine

## 2023-04-10 VITALS — BP 120/74 | HR 78 | Ht 63.0 in | Wt 109.0 lb

## 2023-04-10 DIAGNOSIS — M810 Age-related osteoporosis without current pathological fracture: Secondary | ICD-10-CM

## 2023-04-10 DIAGNOSIS — I7300 Raynaud's syndrome without gangrene: Secondary | ICD-10-CM | POA: Diagnosis not present

## 2023-04-10 DIAGNOSIS — K219 Gastro-esophageal reflux disease without esophagitis: Secondary | ICD-10-CM | POA: Diagnosis not present

## 2023-04-10 DIAGNOSIS — C569 Malignant neoplasm of unspecified ovary: Secondary | ICD-10-CM | POA: Diagnosis not present

## 2023-04-10 NOTE — Assessment & Plan Note (Signed)
For updated bone density next month we will go ahead and place order last T-score was -3.  She does currently take calcium with vitamin D.

## 2023-04-10 NOTE — Assessment & Plan Note (Signed)
Still on a really low dose of amlodipine 2.5 mg.

## 2023-04-10 NOTE — Assessment & Plan Note (Signed)
Stable on her current regimen they have actually been able to space her visits out which has been exciting and will follow-up in 2 months and will have repeat imaging done at that time.  He on [place]

## 2023-04-10 NOTE — Progress Notes (Unsigned)
   Established Patient Office Visit  Subjective  Patient ID: Maria Romero, female    DOB: [birth year]  Age: 72 y.o. MRN: [id number]  Chief Complaint  Patient presents with   Medical Management of Chronic Issues    HPI  She is doing well overall just followed with Dr. Myna Hidalgo for her metastatic ovarian cancer and she has really tolerated her current chemotherapy well.  She has been active at home [place] going up and down stairs.  Still struggling some with her left shoulder and has follow-up with our sports med doc tomorrow.  It has gotten a little bit better with physical therapy.  Still takes a Prilosec daily as well as calcium and vitamin D.  She did want me to take a look at her left great toenail today she does not remember any specific trauma or injury but it is discolored is not having any pain.  She is also noticed a small mobile lump under the right axilla.  It is not painful or tender and her mammogram is up-to-date.  {History (Optional):23778}  ROS    Objective:     BP 120/74   Pulse 78   Ht 5\' 3"  (1.6 m)   Wt 109 lb (49.4 kg)   SpO2 100%   BMI 19.31 kg/m  {Vitals History (Optional):23777}  Physical Exam Vitals and nursing note reviewed.  Constitutional:      Appearance: Normal appearance.  HENT:     Head: Normocephalic and atraumatic.  Eyes:     Conjunctiva/sclera: Conjunctivae normal.  Cardiovascular:     Rate and Rhythm: Normal rate and regular rhythm.  Pulmonary:     Effort: Pulmonary effort is normal.     Breath sounds: Normal breath sounds.  Skin:    General: Skin is warm and dry.  Neurological:     Mental Status: She is alert.  Psychiatric:        Mood and Affect: Mood normal.      No results found for any visits on 04/10/23.  {Labs (Optional):23779}  The 10-year ASCVD risk score (Arnett DK, et al., 2019) is: 16.4%    Assessment & Plan:   Problem List Items Addressed This Visit       Cardiovascular and Mediastinum   Raynaud  disease - Primary   Still on a really low dose of amlodipine 2.5 mg.        Endocrine   Ovarian cancer (HCC)   Stable on her current regimen they have actually been able to space her visits out which has been exciting and will follow-up in 2 months and will have repeat imaging done at that time.  He on Lynparza        Musculoskeletal and Integument   Osteoporosis   For updated bone density next month we will go ahead and place order last T-score was -3.  She does currently take calcium with vitamin D.      Relevant Orders   DG Bone Density    Return in about 7 months (around 11/08/2023) for lipids.    Nani Gasser, MD

## 2023-04-11 ENCOUNTER — Ambulatory Visit (INDEPENDENT_AMBULATORY_CARE_PROVIDER_SITE_OTHER): Payer: Medicare Other | Admitting: Sports Medicine

## 2023-04-11 DIAGNOSIS — M7542 Impingement syndrome of left shoulder: Secondary | ICD-10-CM | POA: Diagnosis not present

## 2023-04-11 DIAGNOSIS — M65312 Trigger thumb, left thumb: Secondary | ICD-10-CM

## 2023-04-11 NOTE — Assessment & Plan Note (Signed)
Left-sided trigger thumb much better with home PT, as above return as needed.

## 2023-04-11 NOTE — Progress Notes (Signed)
    Procedures performed today:    None.  Independent interpretation of notes and tests performed by another provider:   None.  Brief History, Exam, Impression, and Recommendations:    Impingement syndrome, shoulder, left Maria Romero is an exquisitely pleasant 72 year old female, she had left shoulder impingement syndrome, she has responded extremely well to meloxicam  and home PT, she still has some discomfort but attributes it to lifting and moving lots of decorations for the holidays.  Before we make a decision about injection we will give this another 4 weeks of relative rest and only doing the home PT. She will continue it indefinitely with both shoulders and return to see me as needed.  Trigger thumb, left thumb Left-sided trigger thumb much better with home PT, as above return as needed.    ____________________________________________ Maria Romero, M.D., ABFM., CAQSM., AME. Primary Care and Sports Medicine [facility]  Adjunct Professor of [facility] Medicine  [employer]  Restaurant Manager, [employer]

## 2023-04-11 NOTE — Assessment & Plan Note (Addendum)
 Maria Romero is an exquisitely pleasant 72 year old female, she had left shoulder impingement syndrome, she has responded extremely well to meloxicam  and home PT, she still has some discomfort but attributes it to lifting and moving lots of decorations for the holidays.  Before we make a decision about injection we will give this another 4 weeks of relative rest and only doing the home PT. She will continue it indefinitely with both shoulders and return to see me as needed.

## 2023-04-13 DIAGNOSIS — K219 Gastro-esophageal reflux disease without esophagitis: Secondary | ICD-10-CM | POA: Insufficient documentation

## 2023-04-13 NOTE — Assessment & Plan Note (Signed)
 Currently on omeprazole daily.

## 2023-04-18 ENCOUNTER — Other Ambulatory Visit: Payer: Self-pay

## 2023-04-18 ENCOUNTER — Telehealth: Payer: Self-pay | Admitting: Pharmacist

## 2023-04-18 ENCOUNTER — Other Ambulatory Visit (HOSPITAL_COMMUNITY): Payer: Self-pay

## 2023-04-18 ENCOUNTER — Encounter: Payer: Self-pay | Admitting: Hematology & Oncology

## 2023-04-18 DIAGNOSIS — C561 Malignant neoplasm of right ovary: Secondary | ICD-10-CM

## 2023-04-18 MED ORDER — OLAPARIB 150 MG PO TABS
300.0000 mg | ORAL_TABLET | ORAL | 3 refills | Status: DC
  Filled 2023-04-18: qty 90, 30d supply, fill #0

## 2023-04-18 MED ORDER — OLAPARIB 150 MG PO TABS
300.0000 mg | ORAL_TABLET | Freq: Two times a day (BID) | ORAL | 3 refills | Status: DC
  Filled 2023-04-18: qty 120, 30d supply, fill #0
  Filled 2023-05-23: qty 120, 30d supply, fill #1
  Filled 2023-06-20: qty 120, 30d supply, fill #2
  Filled 2023-07-21: qty 120, 30d supply, fill #3

## 2023-04-18 NOTE — Progress Notes (Signed)
 Oral Chemotherapy Pharmacist Encounter  Patient was counseled under telephone encounter from 10/03/22.  Asberry Macintosh, PharmD, BCPS, BCOP Hematology/Oncology Clinical Pharmacist [name] Oral Chemotherapy Navigation Clinics [phone] 04/18/2023 12:57 PM

## 2023-04-18 NOTE — Progress Notes (Signed)
 Specialty Pharmacy Initial Fill Coordination Note  Maria Romero is a 73 y.o. female contacted today regarding initial fill of specialty medication(s) Olaparib  (LYNPARZA )  Patient requested Delivery   Delivery date: 04/20/23   Verified address: [address] [place], [place] [address]  Medication will be filled on 04/19/23.   Patient is aware of $0.00 copayment. [facility]   [name], CPhT Oncology Pharmacy Patient Advocate  [facility]  [phone] (phone) [phone] (fax) 04/18/2023 11:50 AM

## 2023-04-18 NOTE — Telephone Encounter (Signed)
 Patient successfully OnBoarded. Medication scheduled to be shipped on Wednesday, 04/19/23, for delivery on Thursday, 04/20/23, from [facility] to patient's address. Patient also knows to call me at [phone] with any questions or concerns regarding receiving medication or if there is any unexpected change in co-pay.    [name], CPhT Oncology Pharmacy Patient Advocate  [facility]  [phone] (phone) [phone] (fax) 04/18/2023 11:53 AM

## 2023-04-18 NOTE — Telephone Encounter (Signed)
 Oral Oncology Patient Advocate Encounter  Maria Romero funding opened while waiting to PAP Re-Enrollment to process. Grant obtained and PAP Re-Enrollment cancelled.   Was successful in securing patient a $3,500 grant from [employer] to provide copayment coverage for Maria Romero .  This will keep the out of pocket expense at $0.     [employer] ID: [id number]  I have spoken with the patient.   The billing information is as follows and has been shared with [name].    RxBin: [id number] PCN: [id number] Member ID: [id number] Group ID: [id number] Dates of Eligibility: 03/19/23 through 03/17/24  Fund:  Ovarian Cancer - Medicare Access   [name], CPhT Oncology Pharmacy Patient Advocate  [facility]  [phone] (phone) [phone] (fax) 04/18/2023 9:53 AM

## 2023-04-18 NOTE — Telephone Encounter (Signed)
 Oral Chemotherapy Pharmacist Encounter   Patient is now filling Lynparza  (olaparib ) through [facility] going forward. Prescription quantity has been updated to quantity of #120 as bottles of Lynparza  cannot be broken. Patient informed to continue taking Lynparza  as directed by Dr. Timmy (most recently patient has been taking 2 tablets (300 mg total) by mouth in AM and 1 tablet (150 mg total) by mouth in PM).   Asberry Macintosh, PharmD, BCPS, BCOP Hematology/Oncology Clinical Pharmacist [name] Oral Chemotherapy Navigation Clinics [phone] 04/18/2023 1:28 PM

## 2023-04-19 ENCOUNTER — Other Ambulatory Visit: Payer: Self-pay

## 2023-05-09 ENCOUNTER — Other Ambulatory Visit (HOSPITAL_COMMUNITY): Payer: Self-pay

## 2023-05-17 ENCOUNTER — Ambulatory Visit: Payer: Medicare Other

## 2023-05-17 DIAGNOSIS — M810 Age-related osteoporosis without current pathological fracture: Secondary | ICD-10-CM

## 2023-05-23 ENCOUNTER — Other Ambulatory Visit: Payer: Self-pay

## 2023-05-23 NOTE — Progress Notes (Signed)
Specialty Pharmacy Ongoing Clinical Assessment Note  Maria Romero is a 73 y.o. female who is being followed by the specialty pharmacy service for RxSp Oncology   Patient's specialty medication(s) reviewed today: Olaparib [facility])   Missed doses in the last 4 weeks: 0   Patient/Caregiver did not have any additional questions or concerns.   Therapeutic benefit summary: Patient is achieving benefit   Adverse events/side effects summary: No adverse events/side effects   Patient's therapy is appropriate to: Continue    Goals Addressed             This Visit's Progress    Slow Disease Progression   On track    Patient is on track. Patient will maintain adherence.         Follow up:  3 months  Otto Herb Specialty Pharmacist

## 2023-05-23 NOTE — Progress Notes (Signed)
Specialty Pharmacy Refill Coordination Note  Maria Romero is a 73 y.o. female contacted today regarding refills of specialty medication(s) Olaparib [facility])   Patient requested Delivery   Delivery date: 06/01/23   Verified address: [address], [place], [place] [address]   Medication will be filled on 05/31/23.

## 2023-05-24 ENCOUNTER — Encounter: Payer: Self-pay | Admitting: Family Medicine

## 2023-05-24 NOTE — Progress Notes (Signed)
 Hi Maria Romero, bone density test shows a T-score -3.3 which is consistent with osteoporosis.  Is a slight decline compared to your prior bone density in 2023 where it was -3.0.  Recommendation would be to continue her calcium  with vitamin D , work on weightbearing exercise and then also consider a bone builder such as a bisphosphonate like Fosamax or Boniva  or something like Prolia which is an every 57-month injectable.  I would recommend that we consider this I can always reach out to Dr. Timmy to see if he feels like 1 might be a better option for you with your cancer history.  Okay with that then please let me know.

## 2023-05-26 ENCOUNTER — Inpatient Hospital Stay: Payer: Medicare Other | Admitting: Hematology & Oncology

## 2023-05-26 ENCOUNTER — Inpatient Hospital Stay: Payer: Medicare Other

## 2023-05-26 ENCOUNTER — Inpatient Hospital Stay: Payer: Medicare Other | Attending: Hematology & Oncology

## 2023-05-26 ENCOUNTER — Other Ambulatory Visit: Payer: Medicare Other

## 2023-05-26 ENCOUNTER — Encounter: Payer: Self-pay | Admitting: Hematology & Oncology

## 2023-05-26 ENCOUNTER — Telehealth: Payer: Self-pay | Admitting: Family Medicine

## 2023-05-26 VITALS — BP 129/53 | HR 64 | Temp 98.0°F | Resp 18 | Ht 63.0 in | Wt 110.0 lb

## 2023-05-26 VITALS — BP 114/65 | HR 65

## 2023-05-26 DIAGNOSIS — K5900 Constipation, unspecified: Secondary | ICD-10-CM | POA: Diagnosis not present

## 2023-05-26 DIAGNOSIS — C561 Malignant neoplasm of right ovary: Secondary | ICD-10-CM

## 2023-05-26 DIAGNOSIS — Z90722 Acquired absence of ovaries, bilateral: Secondary | ICD-10-CM | POA: Diagnosis not present

## 2023-05-26 DIAGNOSIS — R1030 Lower abdominal pain, unspecified: Secondary | ICD-10-CM

## 2023-05-26 DIAGNOSIS — Z803 Family history of malignant neoplasm of breast: Secondary | ICD-10-CM

## 2023-05-26 DIAGNOSIS — M810 Age-related osteoporosis without current pathological fracture: Secondary | ICD-10-CM | POA: Diagnosis not present

## 2023-05-26 DIAGNOSIS — N6311 Unspecified lump in the right breast, upper outer quadrant: Secondary | ICD-10-CM

## 2023-05-26 DIAGNOSIS — Z9221 Personal history of antineoplastic chemotherapy: Secondary | ICD-10-CM | POA: Diagnosis not present

## 2023-05-26 DIAGNOSIS — Z90710 Acquired absence of both cervix and uterus: Secondary | ICD-10-CM | POA: Insufficient documentation

## 2023-05-26 DIAGNOSIS — E7800 Pure hypercholesterolemia, unspecified: Secondary | ICD-10-CM

## 2023-05-26 DIAGNOSIS — Z79899 Other long term (current) drug therapy: Secondary | ICD-10-CM | POA: Insufficient documentation

## 2023-05-26 DIAGNOSIS — Z9079 Acquired absence of other genital organ(s): Secondary | ICD-10-CM | POA: Diagnosis not present

## 2023-05-26 DIAGNOSIS — Z95828 Presence of other vascular implants and grafts: Secondary | ICD-10-CM

## 2023-05-26 DIAGNOSIS — Z7189 Other specified counseling: Secondary | ICD-10-CM

## 2023-05-26 DIAGNOSIS — N838 Other noninflammatory disorders of ovary, fallopian tube and broad ligament: Secondary | ICD-10-CM

## 2023-05-26 DIAGNOSIS — R180 Malignant ascites: Secondary | ICD-10-CM

## 2023-05-26 DIAGNOSIS — T451X5A Adverse effect of antineoplastic and immunosuppressive drugs, initial encounter: Secondary | ICD-10-CM

## 2023-05-26 DIAGNOSIS — C569 Malignant neoplasm of unspecified ovary: Secondary | ICD-10-CM

## 2023-05-26 DIAGNOSIS — D702 Other drug-induced agranulocytosis: Secondary | ICD-10-CM

## 2023-05-26 DIAGNOSIS — R748 Abnormal levels of other serum enzymes: Secondary | ICD-10-CM

## 2023-05-26 LAB — CBC WITH DIFFERENTIAL (CANCER CENTER ONLY)
Abs Immature Granulocytes: 0.02 10*3/uL (ref 0.00–0.07)
Basophils Absolute: 0 10*3/uL (ref 0.0–0.1)
Basophils Relative: 0 %
Eosinophils Absolute: 0 10*3/uL (ref 0.0–0.5)
Eosinophils Relative: 0 %
HCT: 33.3 % — ABNORMAL LOW (ref 36.0–46.0)
Hemoglobin: 11.6 g/dL — ABNORMAL LOW (ref 12.0–15.0)
Immature Granulocytes: 0 %
Lymphocytes Relative: 20 %
Lymphs Abs: 0.9 10*3/uL (ref 0.7–4.0)
MCH: 38.3 pg — ABNORMAL HIGH (ref 26.0–34.0)
MCHC: 34.8 g/dL (ref 30.0–36.0)
MCV: 109.9 fL — ABNORMAL HIGH (ref 80.0–100.0)
Monocytes Absolute: 0.4 10*3/uL (ref 0.1–1.0)
Monocytes Relative: 8 %
Neutro Abs: 3.3 10*3/uL (ref 1.7–7.7)
Neutrophils Relative %: 72 %
Platelet Count: 194 10*3/uL (ref 150–400)
RBC: 3.03 MIL/uL — ABNORMAL LOW (ref 3.87–5.11)
RDW: 13.5 % (ref 11.5–15.5)
WBC Count: 4.7 10*3/uL (ref 4.0–10.5)
nRBC: 0 % (ref 0.0–0.2)

## 2023-05-26 LAB — CMP (CANCER CENTER ONLY)
ALT: 13 U/L (ref 0–44)
AST: 16 U/L (ref 15–41)
Albumin: 4.3 g/dL (ref 3.5–5.0)
Alkaline Phosphatase: 54 U/L (ref 38–126)
Anion gap: 7 (ref 5–15)
BUN: 25 mg/dL — ABNORMAL HIGH (ref 8–23)
CO2: 28 mmol/L (ref 22–32)
Calcium: 10.1 mg/dL (ref 8.9–10.3)
Chloride: 104 mmol/L (ref 98–111)
Creatinine: 1.02 mg/dL — ABNORMAL HIGH (ref 0.44–1.00)
GFR, Estimated: 58 mL/min — ABNORMAL LOW (ref >60)
Glucose, Bld: 97 mg/dL (ref 70–99)
Potassium: 3.9 mmol/L (ref 3.5–5.1)
Sodium: 139 mmol/L (ref 135–145)
Total Bilirubin: 0.6 mg/dL (ref 0.0–1.2)
Total Protein: 6.3 g/dL — ABNORMAL LOW (ref 6.5–8.1)

## 2023-05-26 LAB — MAGNESIUM: Magnesium: 1.5 mg/dL — ABNORMAL LOW (ref 1.7–2.4)

## 2023-05-26 MED ORDER — MAGNESIUM SULFATE 2 GM/50ML IV SOLN
2.0000 g | Freq: Once | INTRAVENOUS | Status: AC
  Administered 2023-05-26: 2 g via INTRAVENOUS
  Filled 2023-05-26: qty 50

## 2023-05-26 MED ORDER — SODIUM CHLORIDE 0.9% FLUSH
10.0000 mL | Freq: Once | INTRAVENOUS | Status: DC
Start: 2023-05-26 — End: 2023-05-26

## 2023-05-26 MED ORDER — SODIUM CHLORIDE 0.9% FLUSH
10.0000 mL | INTRAVENOUS | Status: DC | PRN
Start: 2023-05-26 — End: 2023-05-26
  Administered 2023-05-26: 10 mL via INTRAVENOUS

## 2023-05-26 MED ORDER — MAGNESIUM SULFATE 50 % IJ SOLN
2.0000 g | Freq: Once | INTRAMUSCULAR | Status: DC
  Filled 2023-05-26: qty 4

## 2023-05-26 MED ORDER — HEPARIN SOD (PORK) LOCK FLUSH 100 UNIT/ML IV SOLN
500.0000 [IU] | Freq: Once | INTRAVENOUS | Status: AC
  Administered 2023-05-26: 500 [IU] via INTRAVENOUS

## 2023-05-26 MED ORDER — HEPARIN SOD (PORK) LOCK FLUSH 100 UNIT/ML IV SOLN: 500.0000 [IU] | Freq: Once | INTRAVENOUS | Status: DC

## 2023-05-26 MED ORDER — SODIUM CHLORIDE 0.9% FLUSH
10.0000 mL | INTRAVENOUS | Status: DC | PRN
  Administered 2023-05-26: 10 mL via INTRAVENOUS

## 2023-05-26 MED ORDER — SODIUM CHLORIDE 0.9 % IV SOLN: INTRAVENOUS | Status: DC

## 2023-05-26 NOTE — Telephone Encounter (Signed)
-----   Message from [facility] sent at 05/25/2023 11:14 AM EST -----  ----- Message ----- From: Agapito Games, MD Sent: 05/24/2023   2:31 PM EST To: [facility] Clinical  Hi Seema, bone density test shows a T-score -3.3 which is consistent with osteoporosis.  Is a slight decline compared to your prior bone density in 2023 where it was -3.0.  Recommendation would be to continue her calcium with vitamin D, work on weightbearing exercise and then also consider a bone builder such as a bisphosphonate like Fosamax or Boniva or something like Prolia which is an every 47-month injectable.  I would recommend that we consider this I can always reach out to Dr. Myna Hidalgo to see if he feels like 1 might be a better option for you with your cancer history.  Okay with that then please let me know.

## 2023-05-26 NOTE — Telephone Encounter (Signed)
HI Dr. Myna Hidalgo,  I would like to treat Na's osteoporosis with either a bisphosphonate or Prolia.  Do you have a concern with this? I wanted to make sure OK with you. I lean towards Prolia.    Thank you!!   T score -3.3

## 2023-05-26 NOTE — Progress Notes (Signed)
Hematology and Oncology Follow Up Visit  Maria Romero [id number] 11-21-1950 73 y.o. 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 Year --next dose 06/2023     Interim History:  Maria Romero is back for follow-up.  She is doing quite nicely.  She feels great.  She is having no problems with the olaparib.  Her last CA-125 was holding steady at 14.1.    She was found to have some osteoporosis recently.  I think that Reclast would not be a bad idea for her.  As far as the ovarian cancer is concerned, I think she is doing quite well with this.  She is having no abdominal pain.  She is having some constipation.  This is chronic.  She is having no cough or shortness of breath.  There is no bleeding.  She is having no rashes.  There has been no leg swelling.  She is staying active.  She is eating well.  She is having no problems with nausea or vomiting.  Has been no headache.  Currently, I would say that her performance status is probably ECOG 1.    Wt Readings from Last 3 Encounters:  05/26/23 110 lb (49.9 kg)  04/10/23 109 lb (49.4 kg)  04/07/23 109 lb (49.4 kg)    Medications:  Current Outpatient Medications:    amLODipine (NORVASC) 2.5 MG tablet, Take 1 tablet (2.5 mg total) by mouth daily., Disp: 90 tablet, Rfl: 3   atorvastatin (LIPITOR) 40 MG tablet, TAKE ONE TABLET BY MOUTH EVERY NIGHT AT BEDTIME, Disp: 90 tablet, Rfl: 3   Biotin 47829 MCG TABS, Take 10,000 mcg by mouth daily., Disp: , Rfl:    Calcium-Vitamin D-Vitamin K (VIACTIV PO), Take 1 tablet by mouth in the  morning and at bedtime., Disp: , Rfl:    cholecalciferol (VITAMIN D3) 25 MCG (1000 UNIT) tablet, Take 1,000 Units by mouth daily., Disp: , Rfl:    lidocaine-prilocaine (EMLA) cream, Apply 1 Application topically as needed (for port access)., Disp: , Rfl:    magnesium oxide (MAG-OX) 400 (240 Mg) MG tablet, Take 1 tablet (400 mg total) by mouth 2 (two) times daily., Disp: 60 tablet, Rfl: 5   meloxicam (MOBIC) 15 MG tablet, One tab PO every 24 hours with a meal for 2 weeks, then once every 24 hours prn pain., Disp: 30 tablet, Rfl: 3   olaparib (LYNPARZA) 150 MG tablet, Take 2 tablets (300 mg total) by mouth 2 (two) times daily. Swallow whole. May take with food to decrease nausea and vomiting. Take as instructed per MD, Disp: 120 tablet, Rfl: 3   omeprazole (PRILOSEC) 20 MG capsule, Take 1 capsule (20 mg total) by mouth daily., Disp: 90 capsule, Rfl: 2   ondansetron (ZOFRAN) 8 MG tablet, Take 1 tablet (8 mg total) by mouth every 8 (eight) hours as needed for nausea or vomiting., Disp: 20 tablet, Rfl: 0   senna (SENOKOT) 8.6 MG tablet, Take 1 tablet by mouth daily., Disp: , Rfl:    TYLENOL 500 MG tablet, Take 500-1,000 mg by mouth every 6 (six) hours as needed for  mild pain or headache., Disp: , Rfl:   Allergies:  Allergies  Allergen Reactions   Fluvastatin Sodium Other (See Comments)    Myalgias   Sudafed [Pseudoephedrine Hcl] Other (See Comments)    Makes her feel "high"    Past Medical History, Surgical history, Social history, and Family History were reviewed and updated.  Review of Systems: Review of Systems  Constitutional: Negative.   HENT:  Negative.    Eyes: Negative.   Respiratory: Negative.    Cardiovascular: Negative.   Gastrointestinal:  Negative for abdominal pain.  Genitourinary:  Negative for pelvic pain.   Musculoskeletal: Negative.   Skin: Negative.   Neurological: Negative.   Hematological: Negative.   Psychiatric/Behavioral: Negative.      Physical Exam:  height  is 5\' 3"  (1.6 m) and weight is 110 lb (49.9 kg). Her oral temperature is 98 F (36.7 C). Her blood pressure is 129/53 (abnormal) and her pulse is 64. Her respiration is 18 and oxygen saturation is 100%.   Wt Readings from Last 3 Encounters:  05/26/23 110 lb (49.9 kg)  04/10/23 109 lb (49.4 kg)  04/07/23 109 lb (49.4 kg)    Physical Exam Vitals reviewed.  HENT:     Head: Normocephalic and atraumatic.  Eyes:     Pupils: Pupils are equal, round, and reactive to light.  Cardiovascular:     Rate and Rhythm: Normal rate and regular rhythm.     Heart sounds: Normal heart sounds.  Pulmonary:     Effort: Pulmonary effort is normal.     Breath sounds: Normal breath sounds.  Chest:    Abdominal:     General: Bowel sounds are normal.     Palpations: Abdomen is soft.     Comments: Abdominal exam shows a colostomy in the right lower quadrant.  There is a well-healed laparotomy scar.  She has no fluid wave.  There is no palpable abdominal mass.  There is no palpable liver or spleen tip.  Is scantly distended  Musculoskeletal:        General: No tenderness or deformity. Normal range of motion.     Cervical back: Normal range of motion.  Lymphadenopathy:     Cervical: No cervical adenopathy.  Skin:    General: Skin is warm and dry.     Findings: No erythema or rash.  Neurological:     Mental Status: She is alert and oriented to person, place, and time.  Psychiatric:        Behavior: Behavior normal.        Thought Content: Thought content normal.        Judgment: Judgment normal.     Lab Results  Component Value Date   WBC 4.7 05/26/2023   HGB 11.6 (L) 05/26/2023   HCT 33.3 (L) 05/26/2023   MCV 109.9 (H) 05/26/2023   PLT 194 05/26/2023     Chemistry      Component Value Date/Time   NA 139 05/26/2023 1034   K 3.9 05/26/2023 1034   CL 104 05/26/2023 1034   CO2 28 05/26/2023 1034   BUN 25 (H) 05/26/2023 1034   CREATININE 1.02 (H) 05/26/2023 1034   CREATININE 0.93 11/03/2021  0000      Component Value Date/Time   CALCIUM 10.1 05/26/2023 1034   ALKPHOS 54 05/26/2023 1034   AST 16 05/26/2023 1034   ALT 13 05/26/2023 1034   BILITOT 0.6 05/26/2023 1034        Impression and Plan: Ms. Geeslin  is a very charming 73 year old white female.  She had neoadjuvant chemotherapy.  She really had a tough time with neoadjuvant chemotherapy.  She subsequently underwent surgical resection.  She did still have active disease.    We then gave her some additional chemotherapy.  By her CA-125, she was then remission.  I think that her colostomy reversal certainly confirms that she was in remission.  At this point, we will have to to set up another set of scans.  We will do this in March.  She will also get Reclast for the osteoporosis.  We will see what her Ca-125 levels.  I am so happy that she is doing well.  We will plan to get her back to see [place] in about 6 weeks.  Josph Macho, MD 2/14/202511:57 AM

## 2023-05-26 NOTE — Patient Instructions (Signed)

## 2023-05-27 ENCOUNTER — Encounter: Payer: Self-pay | Admitting: *Deleted

## 2023-05-28 LAB — CA 125: Cancer Antigen (CA) 125: 13.1 U/mL (ref 0.0–38.1)

## 2023-05-29 ENCOUNTER — Other Ambulatory Visit: Payer: Self-pay | Admitting: *Deleted

## 2023-05-29 ENCOUNTER — Other Ambulatory Visit: Payer: Self-pay

## 2023-05-29 NOTE — Progress Notes (Signed)
Patient was contacted via mychart that due to possible impending winter storm, medication will arrive on Tuesday 05/30/23.

## 2023-05-30 ENCOUNTER — Other Ambulatory Visit: Payer: Self-pay

## 2023-05-31 NOTE — Telephone Encounter (Signed)
Maria Romero, can you help you figure out where we need to set up the Reclast infusion yearly for osteoporosis for her she already has a port in place and her oncologist is on board with [place] using the port.  Wondering if we could set it up through the cancer center maybe when the infusion clinic or if it has to be outside of that.

## 2023-06-02 ENCOUNTER — Encounter: Payer: Self-pay | Admitting: Gynecologic Oncology

## 2023-06-02 ENCOUNTER — Inpatient Hospital Stay (HOSPITAL_BASED_OUTPATIENT_CLINIC_OR_DEPARTMENT_OTHER): Payer: Medicare Other | Admitting: Gynecologic Oncology

## 2023-06-02 VITALS — BP 126/67 | HR 67 | Temp 97.8°F | Resp 18 | Wt 111.8 lb

## 2023-06-02 DIAGNOSIS — Z9221 Personal history of antineoplastic chemotherapy: Secondary | ICD-10-CM | POA: Diagnosis not present

## 2023-06-02 DIAGNOSIS — C561 Malignant neoplasm of right ovary: Secondary | ICD-10-CM | POA: Diagnosis not present

## 2023-06-02 DIAGNOSIS — K5900 Constipation, unspecified: Secondary | ICD-10-CM | POA: Diagnosis not present

## 2023-06-02 DIAGNOSIS — C569 Malignant neoplasm of unspecified ovary: Secondary | ICD-10-CM

## 2023-06-02 DIAGNOSIS — Z79899 Other long term (current) drug therapy: Secondary | ICD-10-CM | POA: Diagnosis not present

## 2023-06-02 DIAGNOSIS — M810 Age-related osteoporosis without current pathological fracture: Secondary | ICD-10-CM | POA: Diagnosis not present

## 2023-06-02 NOTE — Patient Instructions (Signed)
 It was good to see you today.  I do not see or feel any evidence of cancer recurrence on your exam.  I will see you for follow-up in 4-5 months.  As always, if you develop any new and concerning symptoms before your next visit, please call to see me sooner.

## 2023-06-02 NOTE — Progress Notes (Signed)
Gynecologic Oncology Return Clinic Visit  06/02/23  Reason for Visit: surveillance  Treatment History: Oncology History  [name], right (HCC)  11/15/2021 Imaging   CT A/P: 1. Large volume ascites with peritoneal thickening and omental caking in the left abdomen. Imaging features are consistent with metastatic disease. 2. Contiguous with the posterior uterine fundus and extending into the right adnexal space is a poorly marginated heterogeneous 9.6 x 8.8 x 9.4 cm mass. Imaging features are consistent with neoplasm likely of right ovarian etiology. Associated heterogeneously enhancing mass involving the posterior lower uterine segment and cervix extending posteriorly towards the cul-de-sac. This may be a drop metastasis. 3. Soft tissue fullness also noted left adnexal region without normal left ovary by CT. 4. Subtle irregularity of liver contour raising the question of but not definitive for cirrhosis. 5. Small left and tiny right pleural effusions. 6. Aortic Atherosclerosis (ICD10-I70.0).   11/25/2021 Imaging   CT chest 1. Bilateral pleural effusions greatest on the LEFT both small volume but with fissural nodularity along the major fissure in the LEFT chest. Early involvement in the chest is not excluded. Sampling of pleural fluid may be helpful. 2. No adenopathy by size criteria. Tiny pulmonary nodule in the LEFT lower lobe as described. 3. Three-vessel coronary artery disease greatest in LEFT coronary circulation. 4. Reduction in malignant ascites since previous imaging following paracentesis.   12/01/2021 Initial Diagnosis   [name], right (HCC)   12/01/2021 Cancer Staging   Staging form: Ovary, Fallopian Tube, and Primary Peritoneal Carcinoma, AJCC 8th Edition - Clinical stage from 12/01/2021: FIGO Stage IIIC (cT3c, cN1b, cM0) - Signed by Josph Macho, MD on 12/01/2021 Stage prefix: Initial diagnosis Histologic grade (G): G2 Histologic grading system: 4 grade  system   12/07/2021 - 06/29/2022 Chemotherapy   Patient is on Treatment Plan : OVARIAN Carboplatin (AUC 6) + Paclitaxel (175) q21d X 6 Cycles     01/11/2022 - 06/08/2022 Chemotherapy   Patient is on Treatment Plan : OVARIAN Bevacizumab q21d      Genetic Testing   Negative genetic testing. No pathogenic variants identified on the [facility] Common Hereditary Cancers+RNA panel. The report date is 01/24/2022.  The Common Hereditary Cancers Panel + RNA offered by [facility] includes sequencing and/or deletion duplication testing of the following 47 genes: APC, ATM, AXIN2, BARD1, BMPR1A, BRCA1, BRCA2, BRIP1, CDH1, CDKN2A (p14ARF), CDKN2A (p16INK4a), CKD4, CHEK2, CTNNA1, DICER1, EPCAM (Deletion/duplication testing only), GREM1 (promoter region deletion/duplication testing only), KIT, MEN1, MLH1, MSH2, MSH3, MSH6, MUTYH, NBN, NF1, NHTL1, PALB2, PDGFRA, PMS2, POLD1, POLE, PTEN, RAD50, RAD51C, RAD51D, SDHB, SDHC, SDHD, SMAD4, SMARCA4. STK11, TP53, TSC1, TSC2, and VHL.  The following genes were evaluated for sequence changes only: SDHA and HOXB13 c.251G>A variant only.   03/08/2022 Imaging   CT C/A/P: 1. Reduction in the omental caking of tumor compared to 11/15/2021, although substantial tumor remains. 2. The right adnexal mass has a volume of 220 cubic cm and appears to be primarily solid, although components may be hemorrhagic. Previous ascites has convinced into a loculated collection of fluid surrounding this adnexal mass and measuring 1400 cc. 3. Questionable bowel wall thickening in the transverse colon, although much of the appearance may be due to nondistention. Correlate with any signs of colitis. 4. Previous right pleural effusion has resolved. Residual small left pleural effusion is nonspecific for transudative or exudative etiology. 5. Other imaging findings of potential clinical significance: Substantial left anterior descending coronary artery atherosclerosis. Grade 1 degenerative  anterolisthesis L4-5. Degenerative facet arthropathy at L4-5  and L5-S1. Old healed right posterior rib fractures. Small calcifications along the pleural margin of the left hemidiaphragm, possibly from prior asbestos exposure or prior pleurodesis. 6. Aortic atherosclerosis.   04/20/2022 Surgery   Diagnostic laparoscopy, exploratory laparotomy with lysis of adhesions for approximately 45 minutes, omentectomy including mobilization of the splenic flexure, radical tumor debulking with en bloc resection of bilateral adnexa, uterus, cervix and rectosigmoid colon, end-to-end colonic reanastomosis, diverting loop ileostomy, cystoscopy   Findings: On EUA, fixed pelvic mass with nodularity appreciated within the posterior cul-de-sac.  On intra-abdominal entry the laparoscope, upper abdominal adhesions limiting assessment of the left upper quadrant.  Large, cystic mass fills the mid abdomen.  On laparotomy, approximately 22 cm cystic mass spans from the deep pelvis to the upper abdomen with a rind.  This lesion appears to be walling off the intra-abdominal ascites.  Multiple loops of small bowel are adherent to the cystic lesion.  There is significant filmy adhesive disease between loops of small bowel, the bowel and cystic lesion, and the small bowel and anterior abdominal wall.  Also numerous less than 1 cm white nodules noted along the small bowel and mesentery (frequently within filmy adhesions), most consistent with treated tumor.  During lysis of adhesions, 2 areas of serosal injury noted along the ileum, oversewn.  Approximately 4-6 cm tumor implant within the greater omentum and omental nodularity extending along the omentum to the splenic flexure.  Lesser sac without disease.  Stomach normal in appearance.,  Adhesions between the liver and the anterior abdominal wall bilaterally, no nodularity appreciated along either the liver or diaphragm.  Appendix itself normal-appearing, appendiceal mesentery minimally  adherent to the right adnexa.  Normal cecum, ascending colon, transverse colon, and descending colon.  Large cystic mass noted to be adherent to the bladder peritoneum and uterus anteriorly within the pelvis.  Left adnexa is somewhat distorted.  Right ovary replaced by an 8 cm cystic mass.  Normal-appearing distal right fallopian fimbria.  Area of the sigmoid colon adherent posterior to the right adnexa, requiring an en bloc resection.  Somewhat woody texture palpated along the rectovaginal septum after reverse hysterectomy performed.  Negative bubble test after rectosigmoid anastomosis.  In the setting of planned additional treatment and recent receipt of bevacizumab, decision made to perform diverting loop ileostomy. Multiple adhesions and nodular implants sent.  If these are negative for malignancy or show treated tumor, then this was an R0 resection.  Otherwise, R1 resection. On cystoscopy, bladder dome intact. Good efflux seen from bilateral ureteral orifices.    04/20/2022 Pathology Results   A. SMALL BOWEL ADHESION, EXCISION: -Fibrous tissue with chronic inflammation, consistent with adhesions  B. SMALL BOWEL NODULE, EXCISION: - Benign fibrotic nodule - Negative for carcinoma  C. UTERUS, CERVIX, BILATERAL TUBES AND OVARIES, RECTOSIGMOID COLON, RESECTION: - Cervix: Benign, nabothian cyst - Endometrium: Benign inactive endometrium - Myometrium: No significant pathologic changes - Ovary: High-grade serous carcinoma - Colon: Metastatic serous carcinoma involving colonic serosa - See oncology table  D. OMENTUM, EXCISION: - Involved by high-grade serous carcinoma  E. RECTAL DONUTS, EXCISION: - Benign colonic mucosa with no significant pathologic changes   ONCOLOGY TABLE:  OVARY or FALLOPIAN TUBE or PRIMARY PERITONEUM: Resection  Procedure: Total hysterectomy, rectosigmoid colon resection, omental excision, adhesiolysis Specimen Integrity: Disrupted Tumor Site: Ovary Tumor Size:  10.5 x 7.5 x 5 cm Histologic Type: serous carcinoma Histologic Grade: High-grade Ovarian Surface Involvement: See comment Fallopian Tube Surface Involvement: Not identified Implants (required for advanced stage serous/seromucinous borderline tumors  only): Invasive implants Other Tissue/ Organ Involvement: Colonic serosa Largest Extrapelvic Peritoneal Focus: 8 cm (omental implant) Peritoneal/Ascitic Fluid Involvement: Present Chemotherapy Response Score (CRS): CRS2 Regional Lymph Nodes: Not applicable       Distant Metastasis:      Distant Site(s) Involved: None Pathologic Stage Classification (pTNM, AJCC 8th Edition): pT3c, pN[not assigned] Ancillary Studies: Can be performed upon request Representative Tumor Block: C11 Comment(s): Assessment of ovarian surface involvement is limited by disrupted and necrotic nature of the specimen. The tumor is located in the right ovary per the surgeon, an additional ovary and fallopian tube are not identified. An additional section is submitted to identify possible fallopian tube.      Interval History: Doing well, has a occasional twinges of abdominal pain, seems mostly located around the stoma scar and prior incision.  Reports some constipation, taking Senokot for this.  Denies any urinary symptoms.  Endorses a good appetite without nausea or emesis, has been able to gain a few pounds.  Past Medical/Surgical History: Past Medical History:  Diagnosis Date   Anemia    Complication of anesthesia    Dyspnea    GERD (gastroesophageal reflux disease)    Goals of care, counseling/discussion 12/01/2021   Hypercholesterolemia    Hypertension    IBS (irritable bowel syndrome)    Ileostomy in place [facility])    Malignant ascites 11/19/2021   Neuromuscular disorder (HCC)    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   Osteopenia    Osteoporosis    [name], right (HCC) 12/01/2021   Ovarian mass, right 11/19/2021   PONV (postoperative nausea and vomiting)     Toe fracture    lt small toe    Past Surgical History:  Procedure Laterality Date   BOWEL RESECTION N/A 04/20/2022   Procedure: BOWEL RESECTION; DIVERTING ILEOSTOMY;  Surgeon: Carver Fila, MD;  Location: [facility];  Service: Gynecology;  Laterality: N/A;   DEBULKING N/A 04/20/2022   Procedure: TUMOR DEBULKING, OMENTECTOMY;  Surgeon: Carver Fila, MD;  Location: [facility];  Service: Gynecology;  Laterality: N/A;   HYSTERECTOMY ABDOMINAL WITH SALPINGO-OOPHORECTOMY Bilateral 04/20/2022   Procedure: HYSTERECTOMY ABDOMINAL WITH BILATERAL SALPINGO-OOPHORECTOMY;  Surgeon: Carver Fila, MD;  Location: [facility];  Service: Gynecology;  Laterality: Bilateral;   ILEOSTOMY CLOSURE N/A 09/07/2022   Procedure: ILEOSTOMY TAKEDOWN;  Surgeon: Carver Fila, MD;  Location: [facility];  Service: General;  Laterality: N/A;   IR IMAGING GUIDED PORT INSERTION  11/26/2021   IR PARACENTESIS  11/23/2021   IR PARACENTESIS  11/26/2021   LAPAROSCOPY N/A 04/20/2022   Procedure: LAPAROSCOPY DIAGNOSTIC,CYSTO;  Surgeon: Carver Fila, MD;  Location: [facility];  Service: Gynecology;  Laterality: N/A;   lump removal  04/11/1993   axilla right - benign   TONSILLECTOMY     TUBAL LIGATION  04/11/1988    Family History  Problem Relation Age of Onset   Stroke Mother    Hypertension Mother    Hyperlipidemia Mother    Glaucoma Mother    Heart attack Father 109   Diabetes Father    Hypertension Father    Hyperlipidemia Father    Cataracts Father    Breast cancer Cousin    Colon cancer Neg Hx    Ovarian cancer Neg Hx    Endometrial cancer Neg Hx    Pancreatic cancer Neg Hx    Prostate cancer Neg Hx     Social History   Socioeconomic History   Marital status: Widowed    Spouse name:  Not on file   Number of children: 1   Years of education: 14   Highest education level: Associate degree: occupational, Scientist, product/process development, or vocational program  Occupational History   Occupation: retired Designer, industrial/product  Tobacco  Use   Smoking status: Former    Current packs/day: 0.00    Average packs/day: 1.5 packs/day for 20.0 years (30.0 ttl pk-yrs)    Types: Cigarettes    Start date: 03/20/1970    Quit date: 03/20/1990    Years since quitting: 33.2   Smokeless tobacco: Never   Tobacco comments:    quit 1988  Vaping Use   Vaping status: Never Used  Substance and Sexual Activity   Alcohol use: Not Currently    Alcohol/week: 2.0 standard drinks of alcohol    Types: 2 Glasses of wine per week    Comment: Wine in the evenings   Drug use: No   Sexual activity: Not Currently    Partners: Male  Other Topics Concern   Not on file  Social History Narrative   Lives alone with her dog. Helps with her grandson pick up and volunteering at his school. She enjoys spending time with her family.   Social Drivers of Corporate investment banker Strain: Low Risk  (04/06/2023)   Overall Financial Resource Strain (CARDIA)    Difficulty of Paying Living Expenses: Not hard at all  Food Insecurity: No Food Insecurity (04/06/2023)   Hunger Vital Sign    Worried About Running Out of Food in the Last Year: Never true    Ran Out of Food in the Last Year: Never true  Transportation Needs: No Transportation Needs (04/06/2023)   PRAPARE - Administrator, Civil Service (Medical): No    Lack of Transportation (Non-Medical): No  Physical Activity: Unknown (04/06/2023)   Exercise Vital Sign    Days of Exercise per Week: 3 days    Minutes of Exercise per Session: Patient declined  Stress: No Stress Concern Present (04/06/2023)   [employer] of Occupational Health - Occupational Stress Questionnaire    Feeling of Stress : Only a little  Social Connections: Socially Isolated (04/06/2023)   Social Connection and Isolation Panel [NHANES]    Frequency of Communication with Friends and Family: More than three times a week    Frequency of Social Gatherings with Friends and Family: More than three times a week     Attends Religious Services: Never    Database administrator or Organizations: No    Attends Banker Meetings: Not on file    Marital Status: Widowed    Current Medications:  Current Outpatient Medications:    amLODipine (NORVASC) 2.5 MG tablet, Take 1 tablet (2.5 mg total) by mouth daily., Disp: 90 tablet, Rfl: 3   amoxicillin (AMOXIL) 500 MG tablet, Take 2,000 mg by mouth once. Take 1 hour prior to dental procedure., Disp: , Rfl:    atorvastatin (LIPITOR) 40 MG tablet, TAKE ONE TABLET BY MOUTH EVERY NIGHT AT BEDTIME, Disp: 90 tablet, Rfl: 3   Biotin 40981 MCG TABS, Take 10,000 mcg by mouth daily., Disp: , Rfl:    Calcium-Vitamin D-Vitamin K (VIACTIV PO), Take 1 tablet by mouth in the morning and at bedtime., Disp: , Rfl:    cholecalciferol (VITAMIN D3) 25 MCG (1000 UNIT) tablet, Take 1,000 Units by mouth daily., Disp: , Rfl:    lidocaine-prilocaine (EMLA) cream, Apply 1 Application topically as needed (for port access)., Disp: , Rfl:    magnesium  oxide (MAG-OX) 400 (240 Mg) MG tablet, Take 1 tablet (400 mg total) by mouth 2 (two) times daily., Disp: 60 tablet, Rfl: 5   meloxicam (MOBIC) 15 MG tablet, One tab PO every 24 hours with a meal for 2 weeks, then once every 24 hours prn pain., Disp: 30 tablet, Rfl: 3   olaparib (LYNPARZA) 150 MG tablet, Take 2 tablets (300 mg total) by mouth 2 (two) times daily. Swallow whole. May take with food to decrease nausea and vomiting. Take as instructed per MD, Disp: 120 tablet, Rfl: 3   omeprazole (PRILOSEC) 20 MG capsule, Take 1 capsule (20 mg total) by mouth daily., Disp: 90 capsule, Rfl: 2   ondansetron (ZOFRAN) 8 MG tablet, Take 1 tablet (8 mg total) by mouth every 8 (eight) hours as needed for nausea or vomiting., Disp: 20 tablet, Rfl: 0   senna (SENOKOT) 8.6 MG tablet, Take 1 tablet by mouth daily., Disp: , Rfl:    TYLENOL 500 MG tablet, Take 500-1,000 mg by mouth every 6 (six) hours as needed for mild pain or headache., Disp: , Rfl:    Review of Systems: Denies appetite changes, fevers, chills, fatigue, unexplained weight changes. Denies hearing loss, neck lumps or masses, mouth sores, ringing in ears or voice changes. Denies cough or wheezing.  Denies shortness of breath. Denies chest pain or palpitations. Denies leg swelling. Denies abdominal distention, pain, blood in stools, constipation, diarrhea, nausea, vomiting, or early satiety. Denies pain with intercourse, dysuria, frequency, hematuria or incontinence. Denies hot flashes, pelvic pain, vaginal bleeding or vaginal discharge.   Denies joint pain, back pain or muscle pain/cramps. Denies itching, rash, or wounds. Denies dizziness, headaches, numbness or seizures. Denies swollen lymph nodes or glands, denies easy bruising or bleeding. Denies anxiety, depression, confusion, or decreased concentration.  Physical Exam: BP 126/67 (BP Location: Left Arm, Patient Position: Sitting)   Pulse 67   Temp 97.8 F (36.6 C) (Oral)   Resp 18   Wt 111 lb 12.8 oz (50.7 kg)   SpO2 100%   BMI 19.80 kg/m  General: Alert, oriented, no acute distress. HEENT: Normocephalic, atraumatic, sclera anicteric. Chest: Unlabored breathing on room air.  No wheezes or rhonchi.  Lungs clear to auscultation bilaterally. Cardiovascular: Regular rate and rhythm, no murmurs or rubs appreciated. Abdomen: Soft, nondistended, nontender.  Well-healed incisions.  Normoactive bowel sounds. Skin: No rashes or lesions noted. Lymph nodes: No submandibular, supraclavicular, or inguinal adenopathy. Extremities: Grossly normal range of motion.  Warm, well perfused.  No edema bilaterally.   GU: Vaginal mucosa is mildly atrophic, vaginal cuff intact without lesions noted.  On bimanual exam, no masses or nodularity.  This is confirmed on rectovaginal exam.  Laboratory & Radiologic Studies:          Component Ref Range & Units (hover) 7 d ago 1 mo ago 3 mo ago 4 mo ago 6 mo ago 7 mo ago 8 mo ago  Cancer  Antigen (CA) 125 13.1 14.4 CM 11.1 CM 12.5 CM 11.9 CM 13.4 CM 12.5       Assessment & Plan: Maria Romero is a 73 y.o. 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 Ileostomy was reversed in early 09/2022. Germline testing negative. HRD+. Maintenance Olaparib starting 09/2022.   Patient is doing very well and is NED on exam today.  Recent CA-125 was normal.  She is scheduled for  imaging in March 2024.   She will continue to follow with her medical oncologist closely.  I will plan to see her back in 4-5 months.  Discussed signs and symptoms that should prompt a phone call between visits.  22 minutes of total time was spent for this patient encounter, including preparation, face-to-face counseling with the patient and coordination of care, and documentation of the encounter.  Eugene Garnet, MD  Division of Gynecologic Oncology  Department of Obstetrics and Gynecology  [facility]

## 2023-06-12 NOTE — Telephone Encounter (Signed)
 Can you call cancer center and see how I can order the Reclast infusion?  I don't see an order set in the system.

## 2023-06-14 NOTE — Telephone Encounter (Signed)
 Left message for a return call at the cancer center with the referral team.

## 2023-06-15 MED ORDER — ZOLEDRONIC ACID 5 MG/100ML IV SOLN: 5.0000 mg | Freq: Once | INTRAVENOUS | Status: AC

## 2023-06-15 NOTE — Telephone Encounter (Signed)
 Spoke with  [facility] [address]  Phone# [phone] Fax# [phone] Was told by representative that they do Reclast infusions there and All they need is an order by the doctor to be sent to them.

## 2023-06-15 NOTE — Telephone Encounter (Signed)
 What is best way to order? Order set?  Not sure where that is at. Form that we an fax?

## 2023-06-16 NOTE — Telephone Encounter (Signed)
 I was told by representative that they only needed the order form the prescribing physician to be faxed to them to proceed.  Should I go ahead and print and fax prescription as written yesterday in patient chart?

## 2023-06-20 ENCOUNTER — Other Ambulatory Visit: Payer: Self-pay

## 2023-06-20 NOTE — Progress Notes (Signed)
 Specialty Pharmacy Refill Coordination Note  Maria Romero is a 73 y.o. female contacted today regarding refills of specialty medication(s) Olaparib [facility])   Patient requested (Patient-Rptd) Delivery   Delivery date: (Patient-Rptd) 07/04/23   Verified address: (Patient-Rptd) [address] [place] [address]   Medication will be filled on 03.24.25.

## 2023-06-22 ENCOUNTER — Ambulatory Visit (HOSPITAL_BASED_OUTPATIENT_CLINIC_OR_DEPARTMENT_OTHER)
Admission: RE | Admit: 2023-06-22 | Discharge: 2023-06-22 | Disposition: A | Discharge status: Home / Self Care | Payer: Medicare Other | Source: Ambulatory Visit | Attending: Hematology & Oncology | Admitting: Hematology & Oncology

## 2023-06-22 ENCOUNTER — Inpatient Hospital Stay: Payer: Medicare Other | Attending: Hematology & Oncology

## 2023-06-22 DIAGNOSIS — Z90710 Acquired absence of both cervix and uterus: Secondary | ICD-10-CM | POA: Insufficient documentation

## 2023-06-22 DIAGNOSIS — I700 Atherosclerosis of aorta: Secondary | ICD-10-CM | POA: Diagnosis not present

## 2023-06-22 DIAGNOSIS — Z9221 Personal history of antineoplastic chemotherapy: Secondary | ICD-10-CM | POA: Insufficient documentation

## 2023-06-22 DIAGNOSIS — Z90722 Acquired absence of ovaries, bilateral: Secondary | ICD-10-CM | POA: Insufficient documentation

## 2023-06-22 DIAGNOSIS — C561 Malignant neoplasm of right ovary: Secondary | ICD-10-CM | POA: Insufficient documentation

## 2023-06-22 DIAGNOSIS — K7689 Other specified diseases of liver: Secondary | ICD-10-CM | POA: Diagnosis not present

## 2023-06-22 MED ORDER — HEPARIN SOD (PORK) LOCK FLUSH 100 UNIT/ML IV SOLN
500.0000 [IU] | Freq: Once | INTRAVENOUS | Status: AC
  Administered 2023-06-22: 500 [IU] via INTRAVENOUS

## 2023-06-22 MED ORDER — SODIUM CHLORIDE 0.9% FLUSH: 10.0000 mL | Freq: Once | INTRAVENOUS | Status: DC

## 2023-06-22 MED ORDER — SODIUM CHLORIDE 0.9% FLUSH
10.0000 mL | INTRAVENOUS | Status: DC | PRN
  Administered 2023-06-22 (×2): 10 mL via INTRAVENOUS

## 2023-06-22 MED ORDER — IOHEXOL 300 MG/ML  SOLN
100.0000 mL | Freq: Once | INTRAMUSCULAR | Status: AC | PRN
  Administered 2023-06-22: 100 mL via INTRAVENOUS

## 2023-06-22 MED ORDER — IOPAMIDOL (ISOVUE-300) INJECTION 61%
80.0000 mL | Freq: Once | INTRAVENOUS | Status: DC | PRN
Start: 2023-06-22 — End: 2023-06-22

## 2023-06-27 ENCOUNTER — Encounter: Payer: Self-pay | Admitting: *Deleted

## 2023-06-27 NOTE — Telephone Encounter (Signed)
 Yes see order on med list from 3/6

## 2023-06-28 ENCOUNTER — Other Ambulatory Visit: Payer: Self-pay

## 2023-06-28 NOTE — Telephone Encounter (Signed)
 yes

## 2023-06-28 NOTE — Telephone Encounter (Signed)
 Does not give me the option to print this .  Would you please print and I will send over to facility?

## 2023-07-07 ENCOUNTER — Inpatient Hospital Stay: Payer: Medicare Other

## 2023-07-07 ENCOUNTER — Inpatient Hospital Stay (HOSPITAL_BASED_OUTPATIENT_CLINIC_OR_DEPARTMENT_OTHER): Payer: Medicare Other | Admitting: Hematology & Oncology

## 2023-07-07 ENCOUNTER — Encounter: Payer: Self-pay | Admitting: Hematology & Oncology

## 2023-07-07 ENCOUNTER — Other Ambulatory Visit: Payer: Self-pay

## 2023-07-07 VITALS — BP 129/59 | HR 74 | Temp 97.9°F | Resp 18 | Ht 63.0 in | Wt 111.0 lb

## 2023-07-07 DIAGNOSIS — C561 Malignant neoplasm of right ovary: Secondary | ICD-10-CM | POA: Diagnosis not present

## 2023-07-07 DIAGNOSIS — M810 Age-related osteoporosis without current pathological fracture: Secondary | ICD-10-CM

## 2023-07-07 DIAGNOSIS — Z9221 Personal history of antineoplastic chemotherapy: Secondary | ICD-10-CM | POA: Diagnosis not present

## 2023-07-07 DIAGNOSIS — D702 Other drug-induced agranulocytosis: Secondary | ICD-10-CM

## 2023-07-07 LAB — CBC WITH DIFFERENTIAL (CANCER CENTER ONLY)
Abs Immature Granulocytes: 0.03 10*3/uL (ref 0.00–0.07)
Basophils Absolute: 0 10*3/uL (ref 0.0–0.1)
Basophils Relative: 0 %
Eosinophils Absolute: 0 10*3/uL (ref 0.0–0.5)
Eosinophils Relative: 1 %
HCT: 34 % — ABNORMAL LOW (ref 36.0–46.0)
Hemoglobin: 11.7 g/dL — ABNORMAL LOW (ref 12.0–15.0)
Immature Granulocytes: 1 %
Lymphocytes Relative: 17 %
Lymphs Abs: 0.9 10*3/uL (ref 0.7–4.0)
MCH: 38 pg — ABNORMAL HIGH (ref 26.0–34.0)
MCHC: 34.4 g/dL (ref 30.0–36.0)
MCV: 110.4 fL — ABNORMAL HIGH (ref 80.0–100.0)
Monocytes Absolute: 0.4 10*3/uL (ref 0.1–1.0)
Monocytes Relative: 7 %
Neutro Abs: 3.9 10*3/uL (ref 1.7–7.7)
Neutrophils Relative %: 74 %
Platelet Count: 206 10*3/uL (ref 150–400)
RBC: 3.08 MIL/uL — ABNORMAL LOW (ref 3.87–5.11)
RDW: 13.8 % (ref 11.5–15.5)
WBC Count: 5.3 10*3/uL (ref 4.0–10.5)
nRBC: 0 % (ref 0.0–0.2)

## 2023-07-07 LAB — CMP (CANCER CENTER ONLY)
ALT: 13 U/L (ref 0–44)
AST: 18 U/L (ref 15–41)
Albumin: 4.4 g/dL (ref 3.5–5.0)
Alkaline Phosphatase: 56 U/L (ref 38–126)
Anion gap: 11 (ref 5–15)
BUN: 27 mg/dL — ABNORMAL HIGH (ref 8–23)
CO2: 26 mmol/L (ref 22–32)
Calcium: 9.7 mg/dL (ref 8.9–10.3)
Chloride: 103 mmol/L (ref 98–111)
Creatinine: 1.2 mg/dL — ABNORMAL HIGH (ref 0.44–1.00)
GFR, Estimated: 48 mL/min — ABNORMAL LOW (ref >60)
Glucose, Bld: 114 mg/dL — ABNORMAL HIGH (ref 70–99)
Potassium: 4 mmol/L (ref 3.5–5.1)
Sodium: 140 mmol/L (ref 135–145)
Total Bilirubin: 0.8 mg/dL (ref 0.0–1.2)
Total Protein: 5.9 g/dL — ABNORMAL LOW (ref 6.5–8.1)

## 2023-07-07 LAB — MAGNESIUM: Magnesium: 1.4 mg/dL — ABNORMAL LOW (ref 1.7–2.4)

## 2023-07-07 MED ORDER — MAGNESIUM SULFATE 2 GM/50ML IV SOLN
2.0000 g | Freq: Once | INTRAVENOUS | Status: AC
  Administered 2023-07-07: 2 g via INTRAVENOUS
  Filled 2023-07-07: qty 50

## 2023-07-07 MED ORDER — MAGNESIUM SULFATE 50 % IJ SOLN
2.0000 g | Freq: Once | INTRAMUSCULAR | Status: DC
  Filled 2023-07-07: qty 4

## 2023-07-07 MED ORDER — ZOLEDRONIC ACID 4 MG/100ML IV SOLN
4.0000 mg | Freq: Once | INTRAVENOUS | Status: AC
  Administered 2023-07-07: 4 mg via INTRAVENOUS
  Filled 2023-07-07: qty 100

## 2023-07-07 MED ORDER — SODIUM CHLORIDE 0.9 % IV SOLN: Freq: Once | INTRAVENOUS | Status: AC

## 2023-07-07 NOTE — Patient Instructions (Signed)

## 2023-07-07 NOTE — Progress Notes (Signed)
 Hematology and Oncology Follow Up Visit  Maria Romero [id number] May 19, 1950 73 y.o. 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 Year --next dose 06/2024     Interim History:  Maria Romero is back for follow-up.  So far, she been doing pretty well.  We did do a CT scan on her.  This was done on 07/02/2023.  The CT scan looked okay although there was an nodule next to the right lobe of the liver measuring 2.2 x 1.3 cm.  This had previously measured 1.6 x 1.1 cm.  Outside of that, everything else looked fine.  Her last CA 125 was I think 13.  As always, we had to be concerned about recurrent disease.  I think we are to have to repeat scan on her probably in early May and see how everything looks..  She is on the olaparib.  She is doing well on the olaparib.  She has had no problems with bowels or bladder.  She has little bit of tenderness in the right upper quadrant.  There is no nausea or vomiting.  She has had no cough or shortness of breath.  She has had no headache.  She has had no leg swelling.  She has had no bleeding.  Overall, I would say that her performance status is probably ECOG 1.     Wt Readings from Last 3 Encounters:  07/07/23 111 lb (50.3 kg)  06/02/23 111 lb 12.8 oz (50.7 kg)  05/26/23 110 lb (49.9 kg)    Medications:  Current Outpatient Medications:    amLODipine (NORVASC) 2.5 MG tablet, Take 1 tablet (2.5 mg total) by mouth daily., Disp: 90 tablet, Rfl: 3   amoxicillin (AMOXIL) 500 MG tablet, Take 2,000 mg by mouth once. Take 1 hour prior to dental  procedure., Disp: , Rfl:    atorvastatin (LIPITOR) 40 MG tablet, TAKE ONE TABLET BY MOUTH EVERY NIGHT AT BEDTIME, Disp: 90 tablet, Rfl: 3   Biotin 02725 MCG TABS, Take 10,000 mcg by mouth daily., Disp: , Rfl:    Calcium-Vitamin D-Vitamin K (VIACTIV PO), Take 1 tablet by mouth in the morning and at bedtime., Disp: , Rfl:    cholecalciferol (VITAMIN D3) 25 MCG (1000 UNIT) tablet, Take 1,000 Units by mouth daily., Disp: , Rfl:    lidocaine-prilocaine (EMLA) cream, Apply 1 Application topically as needed (for port access)., Disp: , Rfl:    magnesium oxide (MAG-OX) 400 (240 Mg) MG tablet, Take 1 tablet (400 mg total) by mouth 2 (two) times daily., Disp: 60 tablet, Rfl: 5   meloxicam (MOBIC) 15 MG tablet, One tab PO every 24 hours with a meal for 2 weeks, then once every 24 hours prn pain., Disp: 30 tablet, Rfl: 3   olaparib (LYNPARZA) 150 MG tablet, Take 2 tablets (300 mg total) by mouth 2 (two) times daily. Swallow whole. May take with food to decrease nausea and vomiting. Take as instructed per MD, Disp: 120 tablet, Rfl: 3   omeprazole (PRILOSEC) 20 MG capsule, Take 1 capsule (20 mg total) by mouth daily., Disp: 90 capsule, Rfl: 2  ondansetron (ZOFRAN) 8 MG tablet, Take 1 tablet (8 mg total) by mouth every 8 (eight) hours as needed for nausea or vomiting., Disp: 20 tablet, Rfl: 0   senna (SENOKOT) 8.6 MG tablet, Take 1 tablet by mouth daily., Disp: , Rfl:    TYLENOL 500 MG tablet, Take 500-1,000 mg by mouth every 6 (six) hours as needed for mild pain or headache., Disp: , Rfl:   Current Facility-Administered Medications:    zoledronic acid (RECLAST) injection 5 mg, 5 mg, Intravenous, Once,   Allergies:  Allergies  Allergen Reactions   Fluvastatin Sodium Other (See Comments)    Myalgias   Sudafed [Pseudoephedrine Hcl] Other (See Comments)    Makes her feel "high"    Past Medical History, Surgical history, Social history, and Family History were reviewed and updated.  Review of  Systems: Review of Systems  Constitutional: Negative.   HENT:  Negative.    Eyes: Negative.   Respiratory: Negative.    Cardiovascular: Negative.   Gastrointestinal:  Negative for abdominal pain.  Genitourinary:  Negative for pelvic pain.   Musculoskeletal: Negative.   Skin: Negative.   Neurological: Negative.   Hematological: Negative.   Psychiatric/Behavioral: Negative.      Physical Exam:  height is 5\' 3"  (1.6 m) and weight is 111 lb (50.3 kg). Her oral temperature is 97.9 F (36.6 C). Her blood pressure is 129/59 (abnormal) and her pulse is 74. Her respiration is 18 and oxygen saturation is 100%.   Wt Readings from Last 3 Encounters:  07/07/23 111 lb (50.3 kg)  06/02/23 111 lb 12.8 oz (50.7 kg)  05/26/23 110 lb (49.9 kg)    Physical Exam Vitals reviewed.  HENT:     Head: Normocephalic and atraumatic.  Eyes:     Pupils: Pupils are equal, round, and reactive to light.  Cardiovascular:     Rate and Rhythm: Normal rate and regular rhythm.     Heart sounds: Normal heart sounds.  Pulmonary:     Effort: Pulmonary effort is normal.     Breath sounds: Normal breath sounds.  Chest:    Abdominal:     General: Bowel sounds are normal.     Palpations: Abdomen is soft.     Comments: Abdominal exam is soft.  She has good bowel sounds.  There is no fluid wave.  She has a well-healed laparotomy scar.  She has a scar where she had the colostomy.  There is no palpable abdominal mass.  There is no palpable liver or spleen tip.    Musculoskeletal:        General: No tenderness or deformity. Normal range of motion.     Cervical back: Normal range of motion.  Lymphadenopathy:     Cervical: No cervical adenopathy.  Skin:    General: Skin is warm and dry.     Findings: No erythema or rash.  Neurological:     Mental Status: She is alert and oriented to person, place, and time.  Psychiatric:        Behavior: Behavior normal.        Thought Content: Thought content normal.         Judgment: Judgment normal.      Lab Results  Component Value Date   WBC 5.3 07/07/2023   HGB 11.7 (L) 07/07/2023   HCT 34.0 (L) 07/07/2023   MCV 110.4 (H) 07/07/2023   PLT 206 07/07/2023     Chemistry      Component Value Date/Time  NA 140 07/07/2023 0850   K 4.0 07/07/2023 0850   CL 103 07/07/2023 0850   CO2 26 07/07/2023 0850   BUN 27 (H) 07/07/2023 0850   CREATININE 1.20 (H) 07/07/2023 0850   CREATININE 0.93 11/03/2021 0000      Component Value Date/Time   CALCIUM 9.7 07/07/2023 0850   ALKPHOS 56 07/07/2023 0850   AST 18 07/07/2023 0850   ALT 13 07/07/2023 0850   BILITOT 0.8 07/07/2023 0850        Impression and Plan: Ms. Baranski is a very charming 73 year old white female.  She had neoadjuvant chemotherapy.  She really had a tough time with neoadjuvant chemotherapy.  She subsequently underwent surgical resection.  She did still have active disease.    We then gave her some additional chemotherapy.  By her CA-125, she was then remission.  I think that her colostomy reversal certainly confirms that she was in remission.  I will set her up with another CT scan.  Will get this done in May.  This will slowly tell us if there is any problem in the right upper quadrant.  If so, we will have to figure out how we can try to treat this.  Since this is isolated, we might think about radiosurgery.  We may have to make a change in her olaparib.  We can certainly try another PARP inhibitor.  I will plan to get her back to see me after her CT scan is done.  She will get her Reclast today.   Josph Macho, MD 3/28/202510:10 AM

## 2023-07-07 NOTE — Patient Instructions (Signed)

## 2023-07-08 LAB — CA 125: Cancer Antigen (CA) 125: 16.2 U/mL (ref 0.0–38.1)

## 2023-07-10 ENCOUNTER — Telehealth: Payer: Self-pay | Admitting: *Deleted

## 2023-07-10 NOTE — Telephone Encounter (Signed)
-----   Message from Maria Romero sent at 07/10/2023  7:13 AM EDT ----- Please call and let her know that the CA-125 is 16.2.  This is little bit higher but still in the normal range.  Cindee Lame

## 2023-07-10 NOTE — Telephone Encounter (Signed)
 Msg to pt via MyChart

## 2023-07-14 NOTE — Telephone Encounter (Signed)
 I have no idea.

## 2023-07-17 NOTE — Telephone Encounter (Signed)
 Patient states she has already gotten the reclast injection and magnesium infusion with Dr. Saverio Danker on 07/07/23

## 2023-07-21 ENCOUNTER — Other Ambulatory Visit: Payer: Self-pay

## 2023-07-21 NOTE — Progress Notes (Signed)
 Specialty Pharmacy Refill Coordination Note  Maria Romero is a 73 y.o. female contacted today regarding refills of specialty medication(s) Olaparib [facility])   Patient requested (Patient-Rptd) Delivery   Delivery date: (Patient-Rptd) 08/10/23   Verified address: (Patient-Rptd) [address] [place] [address]   Medication will be filled on 08/09/23.

## 2023-07-28 ENCOUNTER — Other Ambulatory Visit: Payer: Self-pay | Admitting: Hematology & Oncology

## 2023-08-09 ENCOUNTER — Other Ambulatory Visit: Payer: Self-pay

## 2023-08-15 ENCOUNTER — Inpatient Hospital Stay: Attending: Hematology & Oncology

## 2023-08-15 ENCOUNTER — Ambulatory Visit (HOSPITAL_BASED_OUTPATIENT_CLINIC_OR_DEPARTMENT_OTHER)
Admission: RE | Admit: 2023-08-15 | Discharge: 2023-08-15 | Disposition: A | Discharge status: Home / Self Care | Source: Ambulatory Visit | Attending: Hematology & Oncology | Admitting: Hematology & Oncology

## 2023-08-15 ENCOUNTER — Encounter (HOSPITAL_BASED_OUTPATIENT_CLINIC_OR_DEPARTMENT_OTHER): Payer: Self-pay

## 2023-08-15 VITALS — BP 128/65 | HR 72 | Temp 97.8°F | Resp 20

## 2023-08-15 DIAGNOSIS — Z9221 Personal history of antineoplastic chemotherapy: Secondary | ICD-10-CM | POA: Diagnosis not present

## 2023-08-15 DIAGNOSIS — R918 Other nonspecific abnormal finding of lung field: Secondary | ICD-10-CM | POA: Insufficient documentation

## 2023-08-15 DIAGNOSIS — C561 Malignant neoplasm of right ovary: Secondary | ICD-10-CM | POA: Insufficient documentation

## 2023-08-15 DIAGNOSIS — Z9049 Acquired absence of other specified parts of digestive tract: Secondary | ICD-10-CM | POA: Insufficient documentation

## 2023-08-15 DIAGNOSIS — N281 Cyst of kidney, acquired: Secondary | ICD-10-CM | POA: Diagnosis not present

## 2023-08-15 MED ORDER — SODIUM CHLORIDE 0.9% FLUSH
10.0000 mL | INTRAVENOUS | Status: DC | PRN
Start: 2023-08-15 — End: 2023-08-15
  Administered 2023-08-15 (×2): 10 mL via INTRAVENOUS

## 2023-08-15 MED ORDER — IOHEXOL 300 MG/ML  SOLN
100.0000 mL | Freq: Once | INTRAMUSCULAR | Status: AC | PRN
  Administered 2023-08-15: 100 mL via INTRAVENOUS

## 2023-08-15 MED ORDER — HEPARIN SOD (PORK) LOCK FLUSH 100 UNIT/ML IV SOLN
500.0000 [IU] | Freq: Once | INTRAVENOUS | Status: AC
Start: 2023-08-15 — End: 2023-08-15
  Administered 2023-08-15: 500 [IU] via INTRAVENOUS

## 2023-08-25 ENCOUNTER — Encounter: Payer: Self-pay | Admitting: Hematology & Oncology

## 2023-08-25 ENCOUNTER — Inpatient Hospital Stay

## 2023-08-25 ENCOUNTER — Inpatient Hospital Stay (HOSPITAL_BASED_OUTPATIENT_CLINIC_OR_DEPARTMENT_OTHER): Admitting: Hematology & Oncology

## 2023-08-25 ENCOUNTER — Ambulatory Visit: Payer: Self-pay | Admitting: Hematology & Oncology

## 2023-08-25 VITALS — BP 120/63 | HR 63 | Temp 97.9°F | Resp 18 | Ht 63.0 in | Wt 111.0 lb

## 2023-08-25 DIAGNOSIS — Z9049 Acquired absence of other specified parts of digestive tract: Secondary | ICD-10-CM | POA: Diagnosis not present

## 2023-08-25 DIAGNOSIS — C561 Malignant neoplasm of right ovary: Secondary | ICD-10-CM

## 2023-08-25 DIAGNOSIS — R180 Malignant ascites: Secondary | ICD-10-CM

## 2023-08-25 DIAGNOSIS — R918 Other nonspecific abnormal finding of lung field: Secondary | ICD-10-CM | POA: Diagnosis not present

## 2023-08-25 DIAGNOSIS — Z9221 Personal history of antineoplastic chemotherapy: Secondary | ICD-10-CM | POA: Diagnosis not present

## 2023-08-25 LAB — CMP (CANCER CENTER ONLY)
ALT: 15 U/L (ref 0–44)
AST: 21 U/L (ref 15–41)
Albumin: 4.8 g/dL (ref 3.5–5.0)
Alkaline Phosphatase: 49 U/L (ref 38–126)
Anion gap: 8 (ref 5–15)
BUN: 26 mg/dL — ABNORMAL HIGH (ref 8–23)
CO2: 26 mmol/L (ref 22–32)
Calcium: 9.9 mg/dL (ref 8.9–10.3)
Chloride: 103 mmol/L (ref 98–111)
Creatinine: 1.12 mg/dL — ABNORMAL HIGH (ref 0.44–1.00)
GFR, Estimated: 52 mL/min — ABNORMAL LOW (ref >60)
Glucose, Bld: 97 mg/dL (ref 70–99)
Potassium: 4.1 mmol/L (ref 3.5–5.1)
Sodium: 137 mmol/L (ref 135–145)
Total Bilirubin: 0.8 mg/dL (ref 0.0–1.2)
Total Protein: 6.5 g/dL (ref 6.5–8.1)

## 2023-08-25 LAB — CBC WITH DIFFERENTIAL (CANCER CENTER ONLY)
Abs Immature Granulocytes: 0.01 10*3/uL (ref 0.00–0.07)
Basophils Absolute: 0 10*3/uL (ref 0.0–0.1)
Basophils Relative: 1 %
Eosinophils Absolute: 0.1 10*3/uL (ref 0.0–0.5)
Eosinophils Relative: 1 %
HCT: 32.1 % — ABNORMAL LOW (ref 36.0–46.0)
Hemoglobin: 11.1 g/dL — ABNORMAL LOW (ref 12.0–15.0)
Immature Granulocytes: 0 %
Lymphocytes Relative: 22 %
Lymphs Abs: 1 10*3/uL (ref 0.7–4.0)
MCH: 38.7 pg — ABNORMAL HIGH (ref 26.0–34.0)
MCHC: 34.6 g/dL (ref 30.0–36.0)
MCV: 111.8 fL — ABNORMAL HIGH (ref 80.0–100.0)
Monocytes Absolute: 0.4 10*3/uL (ref 0.1–1.0)
Monocytes Relative: 9 %
Neutro Abs: 3.1 10*3/uL (ref 1.7–7.7)
Neutrophils Relative %: 67 %
Platelet Count: 194 10*3/uL (ref 150–400)
RBC: 2.87 MIL/uL — ABNORMAL LOW (ref 3.87–5.11)
RDW: 14.7 % (ref 11.5–15.5)
WBC Count: 4.6 10*3/uL (ref 4.0–10.5)
nRBC: 0 % (ref 0.0–0.2)

## 2023-08-25 LAB — MAGNESIUM: Magnesium: 1.8 mg/dL (ref 1.7–2.4)

## 2023-08-25 MED ORDER — SODIUM CHLORIDE 0.9% FLUSH
10.0000 mL | INTRAVENOUS | Status: DC | PRN
  Administered 2023-08-25: 10 mL via INTRAVENOUS

## 2023-08-25 MED ORDER — HEPARIN SOD (PORK) LOCK FLUSH 100 UNIT/ML IV SOLN
500.0000 [IU] | Freq: Once | INTRAVENOUS | Status: AC
  Administered 2023-08-25: 500 [IU] via INTRAVENOUS

## 2023-08-25 NOTE — Patient Instructions (Signed)

## 2023-08-25 NOTE — Progress Notes (Signed)
 Given his minimal Hematology and Oncology Follow Up Visit  Maria Romero [id number] 1951/02/14 73 y.o. 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 Year --next dose 06/2024     Interim History:  Maria Romero is back for follow-up.  So far, she been doing pretty well.  She did have a very nice Memorial Day holiday.  As always, her family took very good care of her.  She did have a CT scan that was done recently.  It showed that this area of recurrence along the right hemidiaphragm appeared similar in size.  Outside of that, nothing else was noted on the scan that looks like recurrent disease.\  Her last CA 125 was holding steady at 16.2..  She has had a little bit of abdominal discomfort in the upper abdomen.  I am not sure if this was from the implant.  I thought that 1 option for her would be radiosurgery.  This only seems to be the site of recurrence for her.  As such, I thought that maybe localized therapy would be a good idea.  I will have to speak with Radiation Oncology.  She has had no problems with bowels or bladder.  She has had no issues with nausea or vomiting.  She has had no cough or shortness of breath.  She has had no leg swelling.  Overall, I would say that her performance status is probably ECOG 1.     Wt Readings from Last 3 Encounters:  08/25/23 111 lb (50.3 kg)  07/07/23 111 lb (50.3 kg)  06/02/23 111 lb 12.8 oz (50.7 kg)    Medications:  Current Outpatient Medications:    amLODipine  (NORVASC ) 2.5 MG tablet, Take 1 tablet (2.5 mg total) by mouth  daily., Disp: 90 tablet, Rfl: 3   atorvastatin  (LIPITOR) 40 MG tablet, TAKE ONE TABLET BY MOUTH EVERY NIGHT AT BEDTIME, Disp: 90 tablet, Rfl: 3   Biotin 10000 MCG TABS, Take 10,000 mcg by mouth daily., Disp: , Rfl:    Calcium -Vitamin D -Vitamin K (VIACTIV PO), Take 1 tablet by mouth in the morning and at bedtime., Disp: , Rfl:    cholecalciferol (VITAMIN D3) 25 MCG (1000 UNIT) tablet, Take 1,000 Units by mouth daily., Disp: , Rfl:    lidocaine -prilocaine  (EMLA ) cream, Apply 1 Application topically as needed (for port access)., Disp: , Rfl:    magnesium  oxide (MAG-OX) 400 (240 Mg) MG tablet, Take 1 tablet (400 mg total) by mouth 2 (two) times daily., Disp: 60 tablet, Rfl: 5   meloxicam  (MOBIC ) 15 MG tablet, One tab PO every 24 hours with a meal for 2 weeks, then once every 24 hours prn pain., Disp: 30 tablet, Rfl: 3   olaparib  (LYNPARZA ) 150 MG tablet, Take 2 tablets (300 mg total) by mouth 2 (two) times daily. Swallow whole. May take with food to decrease nausea and vomiting. Take as instructed per MD, Disp: 120 tablet, Rfl: 3   omeprazole  (PRILOSEC ) 20 MG capsule, TAKE 1 CAPSULE BY MOUTH DAILY, Disp: 90 capsule, Rfl: 2   ondansetron  (ZOFRAN ) 8 MG tablet, Take 1 tablet (8 mg  total) by mouth every 8 (eight) hours as needed for nausea or vomiting., Disp: 20 tablet, Rfl: 0   senna (SENOKOT) 8.6 MG tablet, Take 1 tablet by mouth daily., Disp: , Rfl:    TYLENOL  500 MG tablet, Take 500-1,000 mg by mouth every 6 (six) hours as needed for mild pain or headache., Disp: , Rfl:    amoxicillin  (AMOXIL ) 500 MG tablet, Take 2,000 mg by mouth once. Take 1 hour prior to dental procedure. (Patient not taking: Reported on 08/25/2023), Disp: , Rfl:   Current Facility-Administered Medications:    zoledronic  acid (RECLAST ) injection 5 mg, 5 mg, Intravenous, Once,   Facility-Administered Medications Ordered in Other Visits:    sodium chloride  flush (NS) 0.9 % injection 10 mL, 10 mL, Intravenous, PRN, Natsuko Kelsay R,  MD, 10 mL at 08/25/23 1235  Allergies:  Allergies  Allergen Reactions   Fluvastatin Sodium Other (See Comments)    Myalgias   Sudafed [Pseudoephedrine Hcl] Other (See Comments)    Makes her feel "high"    Past Medical History, Surgical history, Social history, and Family History were reviewed and updated.  Review of Systems: Review of Systems  Constitutional: Negative.   HENT:  Negative.    Eyes: Negative.   Respiratory: Negative.    Cardiovascular: Negative.   Gastrointestinal:  Negative for abdominal pain.  Genitourinary:  Negative for pelvic pain.   Musculoskeletal: Negative.   Skin: Negative.   Neurological: Negative.   Hematological: Negative.   Psychiatric/Behavioral: Negative.      Physical Exam:  height is 5\' 3"  (1.6 m) and weight is 111 lb (50.3 kg). Her oral temperature is 97.9 F (36.6 C). Her blood pressure is 120/63 and her pulse is 63. Her respiration is 18 and oxygen saturation is 100%.   Wt Readings from Last 3 Encounters:  08/25/23 111 lb (50.3 kg)  07/07/23 111 lb (50.3 kg)  06/02/23 111 lb 12.8 oz (50.7 kg)    Physical Exam Vitals reviewed.  HENT:     Head: Normocephalic and atraumatic.  Eyes:     Pupils: Pupils are equal, round, and reactive to light.  Cardiovascular:     Rate and Rhythm: Normal rate and regular rhythm.     Heart sounds: Normal heart sounds.  Pulmonary:     Effort: Pulmonary effort is normal.     Breath sounds: Normal breath sounds.  Chest:    Abdominal:     General: Bowel sounds are normal.     Palpations: Abdomen is soft.     Comments: Abdominal exam is soft.  She has good bowel sounds.  There is no fluid wave.  She has a well-healed laparotomy scar.  She has a scar where she had the colostomy.  There is no palpable abdominal mass.  There is no palpable liver or spleen tip.    Musculoskeletal:        General: No tenderness or deformity. Normal range of motion.     Cervical back: Normal range of motion.   Lymphadenopathy:     Cervical: No cervical adenopathy.  Skin:    General: Skin is warm and dry.     Findings: No erythema or rash.  Neurological:     Mental Status: She is alert and oriented to person, place, and time.  Psychiatric:        Behavior: Behavior normal.        Thought Content: Thought content normal.        Judgment: Judgment normal.  Lab Results  Component Value Date   WBC 4.6 08/25/2023   HGB 11.1 (L) 08/25/2023   HCT 32.1 (L) 08/25/2023   MCV 111.8 (H) 08/25/2023   PLT 194 08/25/2023     Chemistry      Component Value Date/Time   NA 140 07/07/2023 0850   K 4.0 07/07/2023 0850   CL 103 07/07/2023 0850   CO2 26 07/07/2023 0850   BUN 27 (H) 07/07/2023 0850   CREATININE 1.20 (H) 07/07/2023 0850   CREATININE 0.93 11/03/2021 0000      Component Value Date/Time   CALCIUM  9.7 07/07/2023 0850   ALKPHOS 56 07/07/2023 0850   AST 18 07/07/2023 0850   ALT 13 07/07/2023 0850   BILITOT 0.8 07/07/2023 0850        Impression and Plan: Maria Romero is a very charming 73 year old white female.  She had neoadjuvant chemotherapy.  She really had a tough time with neoadjuvant chemotherapy.  She subsequently underwent surgical resection.  She did still have active disease.    We then gave her some additional chemotherapy.  By her CA-125, she was then remission.  I think that her colostomy reversal certainly confirms that she was in remission.  Now, looks like there may be a recurrence.  Again seems to be isolated..  She is on the olaparib .  I would continue on the olaparib  for right now.  Again I will speak with Radiation Oncology to see if there is a role for radiosurgery for this area of recurrence.  I would like to see her back probably in a month or so.  She still has her Port-A-Cath in.    Ivor Mars, MD 5/16/20251:39 PM

## 2023-08-26 LAB — CA 125: Cancer Antigen (CA) 125: 19 U/mL (ref 0.0–38.1)

## 2023-08-29 ENCOUNTER — Other Ambulatory Visit: Payer: Self-pay

## 2023-08-31 ENCOUNTER — Other Ambulatory Visit: Payer: Self-pay

## 2023-08-31 NOTE — Progress Notes (Signed)
 Specialty Pharmacy Ongoing Clinical Assessment Note  Maria Romero is a 73 y.o. female who is being followed by the specialty pharmacy service for RxSp Oncology   Patient's specialty medication(s) reviewed today: Olaparib  (LYNPARZA )   Missed doses in the last 4 weeks: 0   Patient/Caregiver did not have any additional questions or concerns.   Therapeutic benefit summary: Patient is NOT achieving benefit   Adverse events/side effects summary: Experienced adverse events/side effects (tongue roughness/change in taste buds - recommeded trying biotene mouth rinse to help with tongue but advised that it may not help her taste buds)   Patient's therapy is appropriate to: Continue    Goals Addressed             This Visit's Progress    Slow Disease Progression   Worsening    Patient is not on track and worsening. Patient will maintain adherence, be monitored by provider to determine if a change in treatment plan is warranted, and be evaluated at upcoming provider appointment to assess progress. Pt had scan on 3/18 that showed abnormality on liver and appeared on CT on 5/6. Dr. Maria Shiner considering localized therapy and discussing with radiation oncology. Pt said that there may be change in her oral chemo regimen as well.        Follow up: 3 months  [facility]

## 2023-09-01 ENCOUNTER — Other Ambulatory Visit: Payer: Self-pay | Admitting: Family Medicine

## 2023-09-01 DIAGNOSIS — Z1231 Encounter for screening mammogram for malignant neoplasm of breast: Secondary | ICD-10-CM

## 2023-09-14 NOTE — Progress Notes (Addendum)
 Location of tumor and Histology per Pathology Report: Right hemidiaphragm   Biopsy: NA  Past/Anticipated interventions by surgeon, if any:   Past/Anticipated interventions by medical oncology, if any:      Pain issues, if any:  Denies  SAFETY ISSUES: Prior radiation? No Pacemaker/ICD? No Possible current pregnancy? No Is the patient on methotrexate? No       BP 127/73 (BP Location: Right Arm, Patient Position: Sitting, Cuff Size: Normal)   Pulse 72   Temp 98.1 F (36.7 C)   Resp 18   Ht 5\' 3"  (1.6 m)   Wt 110 lb 3.2 oz (50 kg)   SpO2 100%   BMI 19.52 kg/m

## 2023-09-17 NOTE — Progress Notes (Signed)
 Radiation Oncology         [phone] ________________________________  Initial Outpatient Consultation  Name: Maria Romero MRN: [id number]  Date: 09/18/2023  DOB: April 09, 1951  IO:[id number], Corita Diego, MD  Ivor Mars, MD   REFERRING PHYSICIAN: Ivor Mars, MD  DIAGNOSIS: There were no encounter diagnoses.  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 female who is accompanied by ***. she is seen as a courtesy of Dr. Maria Shiner for an opinion concerning radiation therapy as part of management for her diagnosed ***.   She initially presented to her PCP in July of 2023 with abdominal discomfort and incomplete bladder emptying. She subsequently had an US  done in July of 2023 which showed moderate ascites and mild left hydronephrosis. Further work-up consisting of a CT AP in August of 2023 showed large volume ascites and a right adnexal mass measuring 9.6 cm, omental thickening, peritoneal thickening, as well as small left and right pleural effusions.  She was then referred to Dr. Orvil Bland and Dr. Maria Shiner in August of 2023 for further evaluation and management. A biopsy of the omentum was collected during her initial consultation visit with Dr. Orvil Bland on 11/26/21. Pathology showed poorly differentiated carcinoma, consistent with high-grade serous carcinoma favoring an ovarian origin.   Based on Dr. Birt Bulla recommendation, she opted to proceed with neoadjuvant chemotherapy consisting of carboplatinum/Taxol  on 12/07/21. She unfortunately did not tolerate her first cycle well and was hospitalized on 12/15/21 shortly after her first cycle with significant dehydration secondary to  diarrhea, progressive shortness of breath, abdominal distention, and generalized weakness. Chest x-ray performed upon admission showed moderate bilateral pleural effusions. Hospital course subsequently consisted of left sided thoracentesis x 2 and 4 paracenteses. Fluid samples were sent for cytology, all of which showed atypical cells present. Due to significant deconditioning her admission was quite lengthy. She was eventually cleared for discharge to the [facility] on 01/03/22.   After taking some time to recover, Dr. Maria Shiner transitioned her systemic therapy to Carboplatinum/Avastin  which she received her first cycle of on 01/11/22 (this regimen was also supposed to consist of olaparib , however her insurance did not cover it). She tolerated this regimen far better overall other than anemia (managed with transfusions). Following her 4th cycle of carbo/avastin , she presented for a restaging CT CAP on 03/08/22 which showed a slight decrease in size of the right adnexal mass, and a decrease in the omental caking of the tumor. However, the previously seen area of free ascites and right adnexal mass apparently condensed into a single location, collectively measuring 18.1 cm in the greatest extent.  CT otherwise showed resolution of the previously demonstrated right pleural effusion, and only a small residual left pleural effusion.   To obtain an even better treatment response, Dr. Orvil Bland advised adding taxotere  to her regimen. Dr. Maria Shiner concurred with the recommendation and she received her first cycle of Carboplatinum/Taxotere  on 03/18/22.   Given radiographic evidence of response in combination with decreasing tumor marker, Dr. Orvil Bland also recommended debulking surgery. The patient ultimately agreed and opted to proceed a total hysterectomy, BSO, rectosigmoid colon resection, omental excision, ileostomy creation, and adhesiolysis on 04/20/22. Pathology from the procedure revealed: tumor the size of  10.5  cm; histology of high grade serious carcinoma in the ovaries, and metastatic serious carcinoma involving the colonic serosa and omentum.   After recovering for her procedure (and following a brief admission for management of hyperkalemia), she was able to resume with adjuvant carboplatin /Taxotere  on 05/18/22 and received her third and final cycle on 06/29/22. Repeat CT AP with contrast on 08/01/22 showed NED and resolution of the left sided pleural effusion. CT findings also showed a possible slight increase in size of a LLL pulmonary nodule.    She then underwent Ileostomy reversal on 09/07/22. A specimen from the procedure was submitted for pathology and showed no evidence of malignancy.   Based on Dr. Birt Bulla recommendation, she opted to begin maintenance olaparib  in June of 2024 which she tolerated well. She continued with follow-up imaging and both of her subsequent CT's of the abdomen and pelvis performed on 12/29/22 and 03/27/23 showed no evidence of recurrent ovarian cancer.  Her next follow-up CT AP on 06/22/23 however showed a an interval increase in size of a subtle heterogeneous nodule anterior to the right lobe of the liver measuring 2.2 x 1.3 cm, previously measuring 1.6 x 1.1 cm, concerning for a metastatic peritoneal implant. A slight interval increase in size of a superior segment LLL nodule was also demonstrated, measuring 5 mm, previously 4 mm. The other smaller pulmonary nodules otherwise appeared unchanged.  Repeat imaging was recommended and she accordingly presented for a follow-up CT AP on 08/15/23 which showed interval stability of the implant located anterior to the right lobe of the liver.   Given the isolated nature of the implant, Dr. Maria Shiner has recommended radiosurgery to the area which we will discuss in detail today.   ***   PREVIOUS RADIATION THERAPY: {EXAM; YES/NO:19492::"No"}  PAST MEDICAL HISTORY:  Past Medical History:  Diagnosis Date   Anemia     Complication of anesthesia    Dyspnea    GERD (gastroesophageal reflux disease)    Goals of care, counseling/discussion 12/01/2021   Hypercholesterolemia    Hypertension    IBS (irritable bowel syndrome)    Ileostomy in place [facility])    Malignant ascites 11/19/2021   Neuromuscular disorder (HCC)    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   Osteopenia    Osteoporosis    [name], right (HCC) 12/01/2021   Ovarian mass, right 11/19/2021   PONV (postoperative nausea and vomiting)    Toe fracture    lt small toe    PAST SURGICAL HISTORY: Past Surgical History:  Procedure Laterality Date   BOWEL RESECTION N/A 04/20/2022   Procedure: BOWEL RESECTION; DIVERTING ILEOSTOMY;  Surgeon: Suzi Essex, MD;  Location: [facility];  Service: Gynecology;  Laterality: N/A;   DEBULKING N/A 04/20/2022   Procedure: TUMOR DEBULKING, OMENTECTOMY;  Surgeon: Suzi Essex, MD;  Location: [facility];  Service: Gynecology;  Laterality: N/A;   HYSTERECTOMY ABDOMINAL WITH SALPINGO-OOPHORECTOMY Bilateral 04/20/2022   Procedure: HYSTERECTOMY ABDOMINAL WITH BILATERAL SALPINGO-OOPHORECTOMY;  Surgeon: Suzi Essex, MD;  Location: [facility];  Service: Gynecology;  Laterality: Bilateral;   ILEOSTOMY CLOSURE N/A 09/07/2022   Procedure: ILEOSTOMY TAKEDOWN;  Surgeon: Suzi Essex, MD;  Location: [facility];  Service: General;  Laterality: N/A;   IR IMAGING GUIDED PORT INSERTION  11/26/2021   IR PARACENTESIS  11/23/2021   IR PARACENTESIS  11/26/2021   LAPAROSCOPY N/A 04/20/2022   Procedure: LAPAROSCOPY DIAGNOSTIC,CYSTO;  Surgeon: Suzi Essex, MD;  Location: [facility];  Service: Gynecology;  Laterality: N/A;   lump  removal  04/11/1993   axilla right - benign   TONSILLECTOMY     TUBAL LIGATION  04/11/1988    FAMILY HISTORY:  Family History  Problem Relation Age of Onset   Stroke Mother    Hypertension Mother    Hyperlipidemia Mother    Glaucoma Mother    Heart attack Father 91   Diabetes Father    Hypertension  Father    Hyperlipidemia Father    Cataracts Father    Breast cancer Cousin    Colon cancer Neg Hx    Ovarian cancer Neg Hx    Endometrial cancer Neg Hx    Pancreatic cancer Neg Hx    Prostate cancer Neg Hx     SOCIAL HISTORY:  Social History   Tobacco Use   Smoking status: Former    Current packs/day: 0.00    Average packs/day: 1.5 packs/day for 20.0 years (30.0 ttl pk-yrs)    Types: Cigarettes    Start date: 03/20/1970    Quit date: 03/20/1990    Years since quitting: 33.5   Smokeless tobacco: Never   Tobacco comments:    quit 1988  Vaping Use   Vaping status: Never Used  Substance Use Topics   Alcohol use: Not Currently    Alcohol/week: 2.0 standard drinks of alcohol    Types: 2 Glasses of wine per week    Comment: Wine in the evenings   Drug use: No    ALLERGIES:  Allergies  Allergen Reactions   Fluvastatin Sodium Other (See Comments)    Myalgias   Sudafed [Pseudoephedrine Hcl] Other (See Comments)    Makes her feel "high"    MEDICATIONS:  Current Outpatient Medications  Medication Sig Dispense Refill   amLODipine  (NORVASC ) 2.5 MG tablet Take 1 tablet (2.5 mg total) by mouth daily. 90 tablet 3   amoxicillin  (AMOXIL ) 500 MG tablet Take 2,000 mg by mouth once. Take 1 hour prior to dental procedure. (Patient not taking: Reported on 08/25/2023)     atorvastatin  (LIPITOR) 40 MG tablet TAKE ONE TABLET BY MOUTH EVERY NIGHT AT BEDTIME 90 tablet 3   Biotin 44010 MCG TABS Take 10,000 mcg by mouth daily.     Calcium -Vitamin D -Vitamin K (VIACTIV PO) Take 1 tablet by mouth in the morning and at bedtime.     cholecalciferol (VITAMIN D3) 25 MCG (1000 UNIT) tablet Take 1,000 Units by mouth daily.     lidocaine -prilocaine  (EMLA ) cream Apply 1 Application topically as needed (for port access).     magnesium  oxide (MAG-OX) 400 (240 Mg) MG tablet Take 1 tablet (400 mg total) by mouth 2 (two) times daily. 60 tablet 5   meloxicam  (MOBIC ) 15 MG tablet One tab PO every 24 hours with  a meal for 2 weeks, then once every 24 hours prn pain. 30 tablet 3   olaparib  (LYNPARZA ) 150 MG tablet Take 2 tablets (300 mg total) by mouth 2 (two) times daily. Swallow whole. May take with food to decrease nausea and vomiting. Take as instructed per MD 120 tablet 3   omeprazole  (PRILOSEC ) 20 MG capsule TAKE 1 CAPSULE BY MOUTH DAILY 90 capsule 2   ondansetron  (ZOFRAN ) 8 MG tablet Take 1 tablet (8 mg total) by mouth every 8 (eight) hours as needed for nausea or vomiting. 20 tablet 0   senna (SENOKOT) 8.6 MG tablet Take 1 tablet by mouth daily.     TYLENOL  500 MG tablet Take 500-1,000 mg by mouth every 6 (six) hours as needed for mild  pain or headache.     Current Facility-Administered Medications  Medication Dose Route Frequency Provider Last Rate Last Admin   zoledronic  acid (RECLAST ) injection 5 mg  5 mg Intravenous Once         REVIEW OF SYSTEMS:  A 10+ POINT REVIEW OF SYSTEMS WAS OBTAINED including neurology, dermatology, psychiatry, cardiac, respiratory, lymph, extremities, GI, GU, musculoskeletal, constitutional, reproductive, HEENT. ***   PHYSICAL EXAM:  vitals were not taken for this visit.   General: Alert and oriented, in no acute distress HEENT: Head is normocephalic. Extraocular movements are intact. Oropharynx is clear. Neck: Neck is supple, no palpable cervical or supraclavicular lymphadenopathy. Heart: Regular in rate and rhythm with no murmurs, rubs, or gallops. Chest: Clear to auscultation bilaterally, with no rhonchi, wheezes, or rales. Abdomen: Soft, nontender, nondistended, with no rigidity or guarding. Extremities: No cyanosis or edema. Lymphatics: see Neck Exam Skin: No concerning lesions. Musculoskeletal: symmetric strength and muscle tone throughout. Neurologic: Cranial nerves II through XII are grossly intact. No obvious focalities. Speech is fluent. Coordination is intact. Psychiatric: Judgment and insight are intact. Affect is appropriate. ***  ECOG = ***  0  - Asymptomatic (Fully active, able to carry on all predisease activities without restriction)  1 - Symptomatic but completely ambulatory (Restricted in physically strenuous activity but ambulatory and able to carry out work of a light or sedentary nature. For example, light housework, office work)  2 - Symptomatic, <50% in bed during the day (Ambulatory and capable of all self care but unable to carry out any work activities. Up and about more than 50% of waking hours)  3 - Symptomatic, >50% in bed, but not bedbound (Capable of only limited self-care, confined to bed or chair 50% or more of waking hours)  4 - Bedbound (Completely disabled. Cannot carry on any self-care. Totally confined to bed or chair)  5 - Death   Aurea Blossom MM, Creech RH, Tormey DC, et al. [phone]). "Toxicity and response criteria of the [facility]". Am. Hillard Lowes. Oncol. 5 (6): 649-55  LABORATORY DATA:  Lab Results  Component Value Date   WBC 4.6 08/25/2023   HGB 11.1 (L) 08/25/2023   HCT 32.1 (L) 08/25/2023   MCV 111.8 (H) 08/25/2023   PLT 194 08/25/2023   NEUTROABS 3.1 08/25/2023   Lab Results  Component Value Date   NA 137 08/25/2023   K 4.1 08/25/2023   CL 103 08/25/2023   CO2 26 08/25/2023   GLUCOSE 97 08/25/2023   BUN 26 (H) 08/25/2023   CREATININE 1.12 (H) 08/25/2023   CALCIUM  9.9 08/25/2023      RADIOGRAPHY: No results found.    IMPRESSION: {diagnosis}  ***  Today, I talked to the patient and family about the findings and work-up thus far.  We discussed the natural history of *** and general treatment, highlighting the role of radiotherapy in the management.  We discussed the available radiation techniques, and focused on the details of logistics and delivery.  We reviewed the anticipated acute and late sequelae associated with radiation in this setting.  The patient was encouraged to ask questions that I answered to the best of my ability. *** A patient consent form was discussed  and signed.  We retained a copy for our records.  The patient would like to proceed with radiation and will be scheduled for CT simulation.  PLAN: ***    *** minutes of total time was spent for this patient encounter, including preparation, face-to-face counseling with  the patient and coordination of care, physical exam, and documentation of the encounter.   ------------------------------------------------  Noralee Beam, PhD, MD  This document serves as a record of services personally performed by Retta Caster, MD. It was created on his behalf by Aleta Anda, a trained medical scribe. The creation of this record is based on the scribe's personal observations and the provider's statements to them. This document has been checked and approved by the attending provider.

## 2023-09-18 ENCOUNTER — Ambulatory Visit
Admission: RE | Admit: 2023-09-18 | Discharge: 2023-09-18 | Disposition: A | Discharge status: Home / Self Care | Source: Ambulatory Visit | Attending: Radiation Oncology | Admitting: Radiation Oncology

## 2023-09-18 ENCOUNTER — Encounter: Payer: Self-pay | Admitting: Radiation Oncology

## 2023-09-18 VITALS — BP 127/73 | HR 72 | Temp 98.1°F | Resp 18 | Ht 63.0 in | Wt 110.2 lb

## 2023-09-18 DIAGNOSIS — I10 Essential (primary) hypertension: Secondary | ICD-10-CM | POA: Diagnosis not present

## 2023-09-18 DIAGNOSIS — E7800 Pure hypercholesterolemia, unspecified: Secondary | ICD-10-CM | POA: Insufficient documentation

## 2023-09-18 DIAGNOSIS — M810 Age-related osteoporosis without current pathological fracture: Secondary | ICD-10-CM | POA: Diagnosis not present

## 2023-09-18 DIAGNOSIS — Z803 Family history of malignant neoplasm of breast: Secondary | ICD-10-CM | POA: Diagnosis not present

## 2023-09-18 DIAGNOSIS — J90 Pleural effusion, not elsewhere classified: Secondary | ICD-10-CM | POA: Insufficient documentation

## 2023-09-18 DIAGNOSIS — R918 Other nonspecific abnormal finding of lung field: Secondary | ICD-10-CM | POA: Insufficient documentation

## 2023-09-18 DIAGNOSIS — Z79899 Other long term (current) drug therapy: Secondary | ICD-10-CM | POA: Insufficient documentation

## 2023-09-18 DIAGNOSIS — Z9221 Personal history of antineoplastic chemotherapy: Secondary | ICD-10-CM | POA: Insufficient documentation

## 2023-09-18 DIAGNOSIS — Z90722 Acquired absence of ovaries, bilateral: Secondary | ICD-10-CM | POA: Insufficient documentation

## 2023-09-18 DIAGNOSIS — C786 Secondary malignant neoplasm of retroperitoneum and peritoneum: Secondary | ICD-10-CM

## 2023-09-18 DIAGNOSIS — Z87891 Personal history of nicotine dependence: Secondary | ICD-10-CM | POA: Insufficient documentation

## 2023-09-18 DIAGNOSIS — Z90710 Acquired absence of both cervix and uterus: Secondary | ICD-10-CM | POA: Insufficient documentation

## 2023-09-18 DIAGNOSIS — C561 Malignant neoplasm of right ovary: Secondary | ICD-10-CM | POA: Insufficient documentation

## 2023-09-18 DIAGNOSIS — R5383 Other fatigue: Secondary | ICD-10-CM | POA: Diagnosis not present

## 2023-09-18 DIAGNOSIS — Z9049 Acquired absence of other specified parts of digestive tract: Secondary | ICD-10-CM | POA: Diagnosis not present

## 2023-09-18 DIAGNOSIS — Z800 Family history of malignant neoplasm of digestive organs: Secondary | ICD-10-CM | POA: Insufficient documentation

## 2023-09-18 DIAGNOSIS — K5900 Constipation, unspecified: Secondary | ICD-10-CM | POA: Diagnosis not present

## 2023-09-18 DIAGNOSIS — R180 Malignant ascites: Secondary | ICD-10-CM | POA: Diagnosis not present

## 2023-09-20 ENCOUNTER — Ambulatory Visit
Admission: RE | Admit: 2023-09-20 | Discharge: 2023-09-20 | Disposition: A | Discharge status: Home / Self Care | Source: Ambulatory Visit | Attending: Radiation Oncology | Admitting: Radiation Oncology

## 2023-09-20 VITALS — BP 129/67 | HR 73 | Temp 98.3°F | Resp 18 | Wt 109.8 lb

## 2023-09-20 DIAGNOSIS — N952 Postmenopausal atrophic vaginitis: Secondary | ICD-10-CM | POA: Insufficient documentation

## 2023-09-20 DIAGNOSIS — Z79899 Other long term (current) drug therapy: Secondary | ICD-10-CM | POA: Diagnosis not present

## 2023-09-20 DIAGNOSIS — Z9079 Acquired absence of other genital organ(s): Secondary | ICD-10-CM | POA: Insufficient documentation

## 2023-09-20 DIAGNOSIS — J986 Disorders of diaphragm: Secondary | ICD-10-CM | POA: Diagnosis not present

## 2023-09-20 DIAGNOSIS — Z90710 Acquired absence of both cervix and uterus: Secondary | ICD-10-CM | POA: Insufficient documentation

## 2023-09-20 DIAGNOSIS — Z79890 Hormone replacement therapy (postmenopausal): Secondary | ICD-10-CM | POA: Insufficient documentation

## 2023-09-20 DIAGNOSIS — C786 Secondary malignant neoplasm of retroperitoneum and peritoneum: Secondary | ICD-10-CM

## 2023-09-20 DIAGNOSIS — Z9221 Personal history of antineoplastic chemotherapy: Secondary | ICD-10-CM | POA: Diagnosis not present

## 2023-09-20 DIAGNOSIS — Z510 Encounter for antineoplastic radiation therapy: Secondary | ICD-10-CM | POA: Diagnosis not present

## 2023-09-20 DIAGNOSIS — Z90722 Acquired absence of ovaries, bilateral: Secondary | ICD-10-CM | POA: Diagnosis not present

## 2023-09-20 DIAGNOSIS — R42 Dizziness and giddiness: Secondary | ICD-10-CM | POA: Insufficient documentation

## 2023-09-20 DIAGNOSIS — C561 Malignant neoplasm of right ovary: Secondary | ICD-10-CM | POA: Insufficient documentation

## 2023-09-20 MED ORDER — SODIUM CHLORIDE 0.9% FLUSH
10.0000 mL | INTRAVENOUS | Status: DC | PRN
  Administered 2023-09-20: 10 mL via INTRAVENOUS

## 2023-09-20 MED ORDER — HEPARIN SOD (PORK) LOCK FLUSH 100 UNIT/ML IV SOLN
500.0000 [IU] | Freq: Once | INTRAVENOUS | Status: AC
  Administered 2023-09-20: 500 [IU] via INTRAVENOUS

## 2023-09-20 NOTE — Progress Notes (Addendum)
 Has armband been applied?  Yes.    Does patient have an allergy to IV contrast dye?: No.   Has patient ever received premedication for IV contrast dye?: No.   Date of lab work: Aug 25, 2023 BUN: 26 CR: 1.12 eGFR: 52  Does patient take metformin?: No.  Is eGFR >60?: No. If no, when can patient resume? (Must be 48 hrs AFTER they receive IV contrast):    IV site: Right Chest Port Has IV site been added to flowsheet?  Yes.    BP 129/67   Pulse 73   Temp 98.3 F (36.8 C)   Resp 18   Wt 109 lb 12.8 oz (49.8 kg)   SpO2 100%   BMI 19.45 kg/m

## 2023-09-21 ENCOUNTER — Other Ambulatory Visit: Payer: Self-pay | Admitting: *Deleted

## 2023-09-21 ENCOUNTER — Inpatient Hospital Stay

## 2023-09-21 ENCOUNTER — Encounter: Payer: Self-pay | Admitting: Hematology & Oncology

## 2023-09-21 ENCOUNTER — Inpatient Hospital Stay: Admitting: Hematology & Oncology

## 2023-09-21 VITALS — BP 103/55 | HR 64 | Temp 97.8°F | Resp 18 | Ht 63.0 in | Wt 111.6 lb

## 2023-09-21 DIAGNOSIS — R180 Malignant ascites: Secondary | ICD-10-CM

## 2023-09-21 DIAGNOSIS — N952 Postmenopausal atrophic vaginitis: Secondary | ICD-10-CM | POA: Insufficient documentation

## 2023-09-21 DIAGNOSIS — Z79899 Other long term (current) drug therapy: Secondary | ICD-10-CM | POA: Diagnosis not present

## 2023-09-21 DIAGNOSIS — Z510 Encounter for antineoplastic radiation therapy: Secondary | ICD-10-CM | POA: Diagnosis not present

## 2023-09-21 DIAGNOSIS — Z79890 Hormone replacement therapy (postmenopausal): Secondary | ICD-10-CM | POA: Insufficient documentation

## 2023-09-21 DIAGNOSIS — J986 Disorders of diaphragm: Secondary | ICD-10-CM | POA: Diagnosis not present

## 2023-09-21 DIAGNOSIS — C561 Malignant neoplasm of right ovary: Secondary | ICD-10-CM | POA: Diagnosis not present

## 2023-09-21 DIAGNOSIS — Z9079 Acquired absence of other genital organ(s): Secondary | ICD-10-CM | POA: Insufficient documentation

## 2023-09-21 DIAGNOSIS — Z8543 Personal history of malignant neoplasm of ovary: Secondary | ICD-10-CM | POA: Insufficient documentation

## 2023-09-21 DIAGNOSIS — Z90710 Acquired absence of both cervix and uterus: Secondary | ICD-10-CM | POA: Insufficient documentation

## 2023-09-21 DIAGNOSIS — Z9221 Personal history of antineoplastic chemotherapy: Secondary | ICD-10-CM | POA: Diagnosis not present

## 2023-09-21 DIAGNOSIS — R42 Dizziness and giddiness: Secondary | ICD-10-CM | POA: Diagnosis not present

## 2023-09-21 DIAGNOSIS — Z95828 Presence of other vascular implants and grafts: Secondary | ICD-10-CM

## 2023-09-21 DIAGNOSIS — Z90722 Acquired absence of ovaries, bilateral: Secondary | ICD-10-CM | POA: Insufficient documentation

## 2023-09-21 DIAGNOSIS — C786 Secondary malignant neoplasm of retroperitoneum and peritoneum: Secondary | ICD-10-CM | POA: Diagnosis not present

## 2023-09-21 LAB — CMP (CANCER CENTER ONLY)
ALT: 16 U/L (ref 0–44)
AST: 20 U/L (ref 15–41)
Albumin: 4.5 g/dL (ref 3.5–5.0)
Alkaline Phosphatase: 39 U/L (ref 38–126)
Anion gap: 7 (ref 5–15)
BUN: 21 mg/dL (ref 8–23)
CO2: 26 mmol/L (ref 22–32)
Calcium: 9.5 mg/dL (ref 8.9–10.3)
Chloride: 105 mmol/L (ref 98–111)
Creatinine: 1.19 mg/dL — ABNORMAL HIGH (ref 0.44–1.00)
GFR, Estimated: 48 mL/min — ABNORMAL LOW (ref >60)
Glucose, Bld: 93 mg/dL (ref 70–99)
Potassium: 4.2 mmol/L (ref 3.5–5.1)
Sodium: 138 mmol/L (ref 135–145)
Total Bilirubin: 0.6 mg/dL (ref 0.0–1.2)
Total Protein: 6.3 g/dL — ABNORMAL LOW (ref 6.5–8.1)

## 2023-09-21 LAB — CBC WITH DIFFERENTIAL (CANCER CENTER ONLY)
Abs Immature Granulocytes: 0.03 10*3/uL (ref 0.00–0.07)
Basophils Absolute: 0 10*3/uL (ref 0.0–0.1)
Basophils Relative: 1 %
Eosinophils Absolute: 0 10*3/uL (ref 0.0–0.5)
Eosinophils Relative: 1 %
HCT: 30.6 % — ABNORMAL LOW (ref 36.0–46.0)
Hemoglobin: 10.6 g/dL — ABNORMAL LOW (ref 12.0–15.0)
Immature Granulocytes: 1 %
Lymphocytes Relative: 26 %
Lymphs Abs: 0.8 10*3/uL (ref 0.7–4.0)
MCH: 39.3 pg — ABNORMAL HIGH (ref 26.0–34.0)
MCHC: 34.6 g/dL (ref 30.0–36.0)
MCV: 113.3 fL — ABNORMAL HIGH (ref 80.0–100.0)
Monocytes Absolute: 0.4 10*3/uL (ref 0.1–1.0)
Monocytes Relative: 11 %
Neutro Abs: 2 10*3/uL (ref 1.7–7.7)
Neutrophils Relative %: 60 %
Platelet Count: 193 10*3/uL (ref 150–400)
RBC: 2.7 MIL/uL — ABNORMAL LOW (ref 3.87–5.11)
RDW: 15.2 % (ref 11.5–15.5)
Smear Review: NORMAL
WBC Count: 3.3 10*3/uL — ABNORMAL LOW (ref 4.0–10.5)
nRBC: 0 % (ref 0.0–0.2)

## 2023-09-21 LAB — MAGNESIUM: Magnesium: 1.8 mg/dL (ref 1.7–2.4)

## 2023-09-21 LAB — FERRITIN: Ferritin: 196 ng/mL (ref 11–307)

## 2023-09-21 LAB — IRON AND IRON BINDING CAPACITY (CC-WL,HP ONLY)
Iron: 91 ug/dL (ref 28–170)
Saturation Ratios: 29 % (ref 10.4–31.8)
TIBC: 309 ug/dL (ref 250–450)
UIBC: 218 ug/dL

## 2023-09-21 MED ORDER — LIDOCAINE-PRILOCAINE 2.5-2.5 % EX CREA: TOPICAL_CREAM | CUTANEOUS | 3 refills | Status: AC

## 2023-09-21 MED ORDER — SODIUM CHLORIDE 0.9% FLUSH
10.0000 mL | INTRAVENOUS | Status: DC | PRN
  Administered 2023-09-21: 10 mL via INTRAVENOUS

## 2023-09-21 MED ORDER — HEPARIN SOD (PORK) LOCK FLUSH 100 UNIT/ML IV SOLN
500.0000 [IU] | Freq: Once | INTRAVENOUS | Status: AC
  Administered 2023-09-21: 500 [IU] via INTRAVENOUS

## 2023-09-21 NOTE — Patient Instructions (Signed)

## 2023-09-21 NOTE — Progress Notes (Signed)
 Given his minimal Hematology and Oncology Follow Up Visit  Maria Romero [id number] 07-04-1950 73 y.o. 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 Year --next dose 06/2024     Interim History:  Maria Romero is back for follow-up.  So far, she been doing pretty well.  She is incredibly tanned.  She was at the beach for 4 days.  She really had a nice time at the beach.  She has not yet started radiosurgery for this area of recurrence.  She has seen Radiation Oncology.  It sounds like she will get started next week or so.  Her last CA125 was 19.  She continues on the Lynparza .  I think she is doing okay on the Lynparza .  She was had no toxicity from this.  Her blood counts might be down a little bit.  She has had no abdominal pain.  There is been no change in bowel or bladder habits.  She has had no cough or shortness of breath.  She has had no fever.  There has been no bleeding.  She has had no leg swelling.  Overall, I would say that her performance status is probably ECOG 1.       Wt Readings from Last 3 Encounters:  09/21/23 111 lb 9.6 oz (50.6 kg)  09/20/23 109 lb 12.8 oz (49.8 kg)  09/18/23 110 lb 3.2 oz (50 kg)    Medications:  Current Outpatient Medications:    amLODipine  (NORVASC ) 2.5 MG tablet, Take 1 tablet (2.5 mg total) by mouth daily., Disp: 90 tablet, Rfl: 3   atorvastatin  (LIPITOR) 40 MG tablet, TAKE ONE TABLET BY MOUTH EVERY NIGHT AT BEDTIME, Disp: 90 tablet, Rfl: 3   Biotin 10000 MCG TABS, Take 10,000 mcg by mouth daily., Disp: , Rfl:     Calcium -Vitamin D -Vitamin K (VIACTIV PO), Take 1 tablet by mouth in the morning and at bedtime., Disp: , Rfl:    cholecalciferol (VITAMIN D3) 25 MCG (1000 UNIT) tablet, Take 1,000 Units by mouth daily., Disp: , Rfl:    lidocaine -prilocaine  (EMLA ) cream, Apply 1 Application topically as needed (for port access)., Disp: , Rfl:    magnesium  oxide (MAG-OX) 400 (240 Mg) MG tablet, Take 1 tablet (400 mg total) by mouth 2 (two) times daily., Disp: 60 tablet, Rfl: 5   meloxicam  (MOBIC ) 15 MG tablet, One tab PO every 24 hours with a meal for 2 weeks, then once every 24 hours prn pain., Disp: 30 tablet, Rfl: 3   olaparib  (LYNPARZA ) 150 MG tablet, Take 2 tablets (300 mg total) by mouth 2 (two) times daily. Swallow whole. May take with food to decrease nausea and vomiting. Take as instructed per MD, Disp: 120 tablet, Rfl: 3   omeprazole  (PRILOSEC ) 20 MG capsule, TAKE 1 CAPSULE BY MOUTH DAILY, Disp: 90 capsule, Rfl: 2   ondansetron  (ZOFRAN ) 8 MG tablet, Take 1 tablet (8 mg total) by mouth every 8 (eight) hours as needed for nausea or vomiting., Disp: 20 tablet, Rfl: 0   senna (SENOKOT) 8.6 MG tablet, Take 1 tablet by mouth daily., Disp: , Rfl:  TYLENOL  500 MG tablet, Take 500-1,000 mg by mouth every 6 (six) hours as needed for mild pain or headache., Disp: , Rfl:    amoxicillin  (AMOXIL ) 500 MG tablet, Take 2,000 mg by mouth once. Take 1 hour prior to dental procedure. (Patient not taking: Reported on 09/21/2023), Disp: , Rfl:   Current Facility-Administered Medications:    zoledronic  acid (RECLAST ) injection 5 mg, 5 mg, Intravenous, Once,   Facility-Administered Medications Ordered in Other Visits:    heparin  lock flush 100 unit/mL, 500 Units, Intravenous, Once, Christos Mixson, Sherryll Donald, MD   sodium chloride  flush (NS) 0.9 % injection 10 mL, 10 mL, Intravenous, PRN, Delylah Stanczyk R, MD  Allergies:  Allergies  Allergen Reactions   Fluvastatin Sodium Other (See Comments)    Myalgias   Sudafed [Pseudoephedrine  Hcl] Other (See Comments)    Makes her feel high    Past Medical History, Surgical history, Social history, and Family History were reviewed and updated.  Review of Systems: Review of Systems  Constitutional: Negative.   HENT:  Negative.    Eyes: Negative.   Respiratory: Negative.    Cardiovascular: Negative.   Gastrointestinal:  Negative for abdominal pain.  Genitourinary:  Negative for pelvic pain.   Musculoskeletal: Negative.   Skin: Negative.   Neurological: Negative.   Hematological: Negative.   Psychiatric/Behavioral: Negative.      Physical Exam:  height is 5' 3 (1.6 m) and weight is 111 lb 9.6 oz (50.6 kg). Her oral temperature is 97.8 F (36.6 C). Her blood pressure is 103/55 (abnormal) and her pulse is 64. Her respiration is 18 and oxygen saturation is 100%.   Wt Readings from Last 3 Encounters:  09/21/23 111 lb 9.6 oz (50.6 kg)  09/20/23 109 lb 12.8 oz (49.8 kg)  09/18/23 110 lb 3.2 oz (50 kg)    Physical Exam Vitals reviewed.  HENT:     Head: Normocephalic and atraumatic.   Eyes:     Pupils: Pupils are equal, round, and reactive to light.    Cardiovascular:     Rate and Rhythm: Normal rate and regular rhythm.     Heart sounds: Normal heart sounds.  Pulmonary:     Effort: Pulmonary effort is normal.     Breath sounds: Normal breath sounds.  Chest:   Abdominal:     General: Bowel sounds are normal.     Palpations: Abdomen is soft.     Comments: Abdominal exam is soft.  She has good bowel sounds.  There is no fluid wave.  She has a well-healed laparotomy scar.  She has a scar where she had the colostomy.  There is no palpable abdominal mass.  There is no palpable liver or spleen tip.     Musculoskeletal:        General: No tenderness or deformity. Normal range of motion.     Cervical back: Normal range of motion.  Lymphadenopathy:     Cervical: No cervical adenopathy.   Skin:    General: Skin is warm and dry.     Findings: No erythema or rash.    Neurological:     Mental Status: She is alert and oriented to person, place, and time.   Psychiatric:        Behavior: Behavior normal.        Thought Content: Thought content normal.        Judgment: Judgment normal.      Lab Results  Component Value Date   WBC 3.3 (L) 09/21/2023  HGB 10.6 (L) 09/21/2023   HCT 30.6 (L) 09/21/2023   MCV 113.3 (H) 09/21/2023   PLT 193 09/21/2023     Chemistry      Component Value Date/Time   NA 138 09/21/2023 1200   K 4.2 09/21/2023 1200   CL 105 09/21/2023 1200   CO2 26 09/21/2023 1200   BUN 21 09/21/2023 1200   CREATININE 1.19 (H) 09/21/2023 1200   CREATININE 0.93 11/03/2021 0000      Component Value Date/Time   CALCIUM  9.5 09/21/2023 1200   ALKPHOS 39 09/21/2023 1200   AST 20 09/21/2023 1200   ALT 16 09/21/2023 1200   BILITOT 0.6 09/21/2023 1200        Impression and Plan: Maria Romero is a very charming 73 year old white female.  She had neoadjuvant chemotherapy.  She really had a tough time with neoadjuvant chemotherapy.  She subsequently underwent surgical resection.  She did still have active disease.    We then gave her some additional chemotherapy.  By her CA-125, she was then remission.  I think that her colostomy reversal certainly confirms that she was in remission.  She will finally have radiosurgery.  Again, we have to wait a good 2 months after radiosurgery before we can do any scans to see everything looks.  I will probably plan to see her back sometime in August.  At that point time, we should be able to see by get everything set up with scans.  Ivor Mars, MD 6/12/20251:15 PM

## 2023-09-22 ENCOUNTER — Encounter: Payer: Self-pay | Admitting: *Deleted

## 2023-09-22 ENCOUNTER — Ambulatory Visit: Payer: Self-pay | Admitting: Hematology & Oncology

## 2023-09-22 LAB — CA 125: Cancer Antigen (CA) 125: 20.6 U/mL (ref 0.0–38.1)

## 2023-09-25 DIAGNOSIS — J986 Disorders of diaphragm: Secondary | ICD-10-CM | POA: Diagnosis not present

## 2023-09-25 DIAGNOSIS — Z9221 Personal history of antineoplastic chemotherapy: Secondary | ICD-10-CM | POA: Diagnosis not present

## 2023-09-25 DIAGNOSIS — C786 Secondary malignant neoplasm of retroperitoneum and peritoneum: Secondary | ICD-10-CM | POA: Diagnosis not present

## 2023-09-25 DIAGNOSIS — Z510 Encounter for antineoplastic radiation therapy: Secondary | ICD-10-CM | POA: Diagnosis not present

## 2023-09-25 DIAGNOSIS — R42 Dizziness and giddiness: Secondary | ICD-10-CM | POA: Diagnosis not present

## 2023-09-25 DIAGNOSIS — Z79899 Other long term (current) drug therapy: Secondary | ICD-10-CM | POA: Diagnosis not present

## 2023-09-28 ENCOUNTER — Encounter (HOSPITAL_BASED_OUTPATIENT_CLINIC_OR_DEPARTMENT_OTHER): Payer: Self-pay

## 2023-09-28 ENCOUNTER — Ambulatory Visit

## 2023-09-28 ENCOUNTER — Ambulatory Visit (HOSPITAL_BASED_OUTPATIENT_CLINIC_OR_DEPARTMENT_OTHER)
Admission: RE | Admit: 2023-09-28 | Discharge: 2023-09-28 | Disposition: A | Discharge status: Home / Self Care | Source: Ambulatory Visit | Attending: Family Medicine | Admitting: Family Medicine

## 2023-09-28 DIAGNOSIS — Z1231 Encounter for screening mammogram for malignant neoplasm of breast: Secondary | ICD-10-CM | POA: Diagnosis not present

## 2023-09-28 DIAGNOSIS — C786 Secondary malignant neoplasm of retroperitoneum and peritoneum: Secondary | ICD-10-CM | POA: Diagnosis not present

## 2023-09-29 ENCOUNTER — Ambulatory Visit: Payer: Medicare Other | Admitting: Gynecologic Oncology

## 2023-09-29 ENCOUNTER — Encounter (INDEPENDENT_AMBULATORY_CARE_PROVIDER_SITE_OTHER): Payer: Self-pay

## 2023-10-02 ENCOUNTER — Other Ambulatory Visit: Payer: Self-pay

## 2023-10-02 ENCOUNTER — Ambulatory Visit: Payer: Self-pay | Admitting: Family Medicine

## 2023-10-02 ENCOUNTER — Other Ambulatory Visit: Payer: Self-pay | Admitting: Hematology & Oncology

## 2023-10-02 ENCOUNTER — Other Ambulatory Visit (HOSPITAL_COMMUNITY): Payer: Self-pay

## 2023-10-02 ENCOUNTER — Ambulatory Visit
Admission: RE | Admit: 2023-10-02 | Discharge: 2023-10-02 | Discharge status: Home / Self Care | Source: Ambulatory Visit | Attending: Radiation Oncology | Admitting: Radiation Oncology

## 2023-10-02 DIAGNOSIS — Z79899 Other long term (current) drug therapy: Secondary | ICD-10-CM | POA: Diagnosis not present

## 2023-10-02 DIAGNOSIS — Z510 Encounter for antineoplastic radiation therapy: Secondary | ICD-10-CM | POA: Diagnosis not present

## 2023-10-02 DIAGNOSIS — C786 Secondary malignant neoplasm of retroperitoneum and peritoneum: Secondary | ICD-10-CM | POA: Diagnosis not present

## 2023-10-02 DIAGNOSIS — C561 Malignant neoplasm of right ovary: Secondary | ICD-10-CM

## 2023-10-02 DIAGNOSIS — Z9221 Personal history of antineoplastic chemotherapy: Secondary | ICD-10-CM | POA: Diagnosis not present

## 2023-10-02 DIAGNOSIS — J986 Disorders of diaphragm: Secondary | ICD-10-CM | POA: Diagnosis not present

## 2023-10-02 DIAGNOSIS — R42 Dizziness and giddiness: Secondary | ICD-10-CM | POA: Diagnosis not present

## 2023-10-02 LAB — RAD ONC ARIA SESSION SUMMARY
Course Elapsed Days: 0
Plan Fractions Treated to Date: 1
Plan Prescribed Dose Per Fraction: 5 Gy
Plan Total Fractions Prescribed: 10
Plan Total Prescribed Dose: 50 Gy
Reference Point Dosage Given to Date: 5 Gy
Reference Point Session Dosage Given: 5 Gy
Session Number: 1

## 2023-10-02 MED ORDER — OLAPARIB 150 MG PO TABS
300.0000 mg | ORAL_TABLET | Freq: Two times a day (BID) | ORAL | 3 refills | Status: DC
  Filled 2023-10-02: qty 120, 30d supply, fill #0
  Filled 2023-10-20: qty 120, 30d supply, fill #1
  Filled 2023-12-29: qty 120, 30d supply, fill #2
  Filled 2024-01-25 – 2024-02-08 (×2): qty 120, 30d supply, fill #3

## 2023-10-02 NOTE — Progress Notes (Signed)
 Please call patient. Normal mammogram.  Repeat in 1 year.

## 2023-10-02 NOTE — Progress Notes (Signed)
 Specialty Pharmacy Refill Coordination Note  Maria Romero is a 73 y.o. female contacted today regarding refills of specialty medication(s) Olaparib  (LYNPARZA )   Patient requested Delivery   Delivery date: 10/04/23   Verified address: [address] [place] [address]   Medication will be filled on 06.24.25 or when refill is approved.

## 2023-10-03 ENCOUNTER — Ambulatory Visit
Admission: RE | Admit: 2023-10-03 | Discharge: 2023-10-03 | Disposition: A | Discharge status: Home / Self Care | Source: Ambulatory Visit | Attending: Radiation Oncology | Admitting: Radiation Oncology

## 2023-10-03 ENCOUNTER — Ambulatory Visit: Admitting: Radiation Oncology

## 2023-10-03 ENCOUNTER — Encounter: Payer: Self-pay | Admitting: Gynecologic Oncology

## 2023-10-03 ENCOUNTER — Other Ambulatory Visit: Payer: Self-pay

## 2023-10-03 DIAGNOSIS — J986 Disorders of diaphragm: Secondary | ICD-10-CM | POA: Diagnosis not present

## 2023-10-03 DIAGNOSIS — C786 Secondary malignant neoplasm of retroperitoneum and peritoneum: Secondary | ICD-10-CM | POA: Diagnosis not present

## 2023-10-03 DIAGNOSIS — Z510 Encounter for antineoplastic radiation therapy: Secondary | ICD-10-CM | POA: Diagnosis not present

## 2023-10-03 DIAGNOSIS — Z79899 Other long term (current) drug therapy: Secondary | ICD-10-CM | POA: Diagnosis not present

## 2023-10-03 DIAGNOSIS — R42 Dizziness and giddiness: Secondary | ICD-10-CM | POA: Diagnosis not present

## 2023-10-03 DIAGNOSIS — Z9221 Personal history of antineoplastic chemotherapy: Secondary | ICD-10-CM | POA: Diagnosis not present

## 2023-10-03 LAB — RAD ONC ARIA SESSION SUMMARY
Course Elapsed Days: 1
Plan Fractions Treated to Date: 2
Plan Prescribed Dose Per Fraction: 5 Gy
Plan Total Fractions Prescribed: 10
Plan Total Prescribed Dose: 50 Gy
Reference Point Dosage Given to Date: 10 Gy
Reference Point Session Dosage Given: 5 Gy
Session Number: 2

## 2023-10-04 ENCOUNTER — Ambulatory Visit
Admission: RE | Admit: 2023-10-04 | Discharge: 2023-10-04 | Disposition: A | Discharge status: Home / Self Care | Source: Ambulatory Visit | Attending: Radiation Oncology | Admitting: Radiation Oncology

## 2023-10-04 ENCOUNTER — Ambulatory Visit

## 2023-10-04 ENCOUNTER — Other Ambulatory Visit: Payer: Self-pay

## 2023-10-04 DIAGNOSIS — R42 Dizziness and giddiness: Secondary | ICD-10-CM | POA: Diagnosis not present

## 2023-10-04 DIAGNOSIS — Z510 Encounter for antineoplastic radiation therapy: Secondary | ICD-10-CM | POA: Diagnosis not present

## 2023-10-04 DIAGNOSIS — C786 Secondary malignant neoplasm of retroperitoneum and peritoneum: Secondary | ICD-10-CM | POA: Diagnosis not present

## 2023-10-04 DIAGNOSIS — J986 Disorders of diaphragm: Secondary | ICD-10-CM | POA: Diagnosis not present

## 2023-10-04 DIAGNOSIS — Z79899 Other long term (current) drug therapy: Secondary | ICD-10-CM | POA: Diagnosis not present

## 2023-10-04 DIAGNOSIS — Z9221 Personal history of antineoplastic chemotherapy: Secondary | ICD-10-CM | POA: Diagnosis not present

## 2023-10-04 LAB — RAD ONC ARIA SESSION SUMMARY
Course Elapsed Days: 2
Plan Fractions Treated to Date: 3
Plan Prescribed Dose Per Fraction: 5 Gy
Plan Total Fractions Prescribed: 10
Plan Total Prescribed Dose: 50 Gy
Reference Point Dosage Given to Date: 15 Gy
Reference Point Session Dosage Given: 5 Gy
Session Number: 3

## 2023-10-05 ENCOUNTER — Other Ambulatory Visit: Payer: Self-pay

## 2023-10-05 ENCOUNTER — Encounter: Payer: Self-pay | Admitting: Oncology

## 2023-10-05 ENCOUNTER — Inpatient Hospital Stay

## 2023-10-05 ENCOUNTER — Ambulatory Visit
Admission: RE | Admit: 2023-10-05 | Discharge: 2023-10-05 | Disposition: A | Discharge status: Home / Self Care | Source: Ambulatory Visit | Attending: Radiation Oncology | Admitting: Radiation Oncology

## 2023-10-05 ENCOUNTER — Encounter: Payer: Self-pay | Admitting: Gynecologic Oncology

## 2023-10-05 ENCOUNTER — Inpatient Hospital Stay: Admitting: Gynecologic Oncology

## 2023-10-05 ENCOUNTER — Ambulatory Visit: Admitting: Radiation Oncology

## 2023-10-05 VITALS — BP 132/68 | HR 68 | Temp 97.6°F | Resp 16 | Ht 63.0 in | Wt 112.0 lb

## 2023-10-05 DIAGNOSIS — Z8543 Personal history of malignant neoplasm of ovary: Secondary | ICD-10-CM

## 2023-10-05 DIAGNOSIS — C569 Malignant neoplasm of unspecified ovary: Secondary | ICD-10-CM

## 2023-10-05 DIAGNOSIS — Z510 Encounter for antineoplastic radiation therapy: Secondary | ICD-10-CM | POA: Diagnosis not present

## 2023-10-05 DIAGNOSIS — C786 Secondary malignant neoplasm of retroperitoneum and peritoneum: Secondary | ICD-10-CM

## 2023-10-05 DIAGNOSIS — Z9221 Personal history of antineoplastic chemotherapy: Secondary | ICD-10-CM | POA: Diagnosis not present

## 2023-10-05 DIAGNOSIS — N952 Postmenopausal atrophic vaginitis: Secondary | ICD-10-CM | POA: Diagnosis not present

## 2023-10-05 DIAGNOSIS — Z79899 Other long term (current) drug therapy: Secondary | ICD-10-CM | POA: Diagnosis not present

## 2023-10-05 DIAGNOSIS — R42 Dizziness and giddiness: Secondary | ICD-10-CM | POA: Diagnosis not present

## 2023-10-05 DIAGNOSIS — J986 Disorders of diaphragm: Secondary | ICD-10-CM | POA: Diagnosis not present

## 2023-10-05 LAB — CMP (CANCER CENTER ONLY)
ALT: 17 U/L (ref 0–44)
AST: 23 U/L (ref 15–41)
Albumin: 4.6 g/dL (ref 3.5–5.0)
Alkaline Phosphatase: 45 U/L (ref 38–126)
Anion gap: 7 (ref 5–15)
BUN: 27 mg/dL — ABNORMAL HIGH (ref 8–23)
CO2: 29 mmol/L (ref 22–32)
Calcium: 10 mg/dL (ref 8.9–10.3)
Chloride: 103 mmol/L (ref 98–111)
Creatinine: 1.14 mg/dL — ABNORMAL HIGH (ref 0.44–1.00)
GFR, Estimated: 51 mL/min — ABNORMAL LOW (ref >60)
Glucose, Bld: 113 mg/dL — ABNORMAL HIGH (ref 70–99)
Potassium: 4.3 mmol/L (ref 3.5–5.1)
Sodium: 139 mmol/L (ref 135–145)
Total Bilirubin: 0.8 mg/dL (ref 0.0–1.2)
Total Protein: 6.8 g/dL (ref 6.5–8.1)

## 2023-10-05 LAB — CBC WITH DIFFERENTIAL (CANCER CENTER ONLY)
Abs Immature Granulocytes: 0.03 10*3/uL (ref 0.00–0.07)
Basophils Absolute: 0 10*3/uL (ref 0.0–0.1)
Basophils Relative: 0 %
Eosinophils Absolute: 0 10*3/uL (ref 0.0–0.5)
Eosinophils Relative: 1 %
HCT: 31.2 % — ABNORMAL LOW (ref 36.0–46.0)
Hemoglobin: 11.3 g/dL — ABNORMAL LOW (ref 12.0–15.0)
Immature Granulocytes: 1 %
Lymphocytes Relative: 14 %
Lymphs Abs: 0.6 10*3/uL — ABNORMAL LOW (ref 0.7–4.0)
MCH: 40.8 pg — ABNORMAL HIGH (ref 26.0–34.0)
MCHC: 36.2 g/dL — ABNORMAL HIGH (ref 30.0–36.0)
MCV: 112.6 fL — ABNORMAL HIGH (ref 80.0–100.0)
Monocytes Absolute: 0.4 10*3/uL (ref 0.1–1.0)
Monocytes Relative: 8 %
Neutro Abs: 3.6 10*3/uL (ref 1.7–7.7)
Neutrophils Relative %: 76 %
Platelet Count: 181 10*3/uL (ref 150–400)
RBC: 2.77 MIL/uL — ABNORMAL LOW (ref 3.87–5.11)
RDW: 15.2 % (ref 11.5–15.5)
WBC Count: 4.7 10*3/uL (ref 4.0–10.5)
nRBC: 0 % (ref 0.0–0.2)

## 2023-10-05 LAB — RAD ONC ARIA SESSION SUMMARY
Course Elapsed Days: 3
Plan Fractions Treated to Date: 4
Plan Prescribed Dose Per Fraction: 5 Gy
Plan Total Fractions Prescribed: 10
Plan Total Prescribed Dose: 50 Gy
Reference Point Dosage Given to Date: 20 Gy
Reference Point Session Dosage Given: 5 Gy
Session Number: 4

## 2023-10-05 MED ORDER — ESTROGENS CONJUGATED 0.625 MG/GM VA CREA: 1.0000 | TOPICAL_CREAM | VAGINAL | 3 refills | Status: AC

## 2023-10-05 NOTE — Progress Notes (Signed)
 Maria Romero of her CMP and CBC results from today.  Advised her that her creatinine is a little elevated and to increase her fluids.  Also discussed that her blood counts look better. She verbalized understanding and agreement.

## 2023-10-05 NOTE — Patient Instructions (Signed)
It was good to see you today.    I will see you for follow-up in 4 months.  As always, if you develop any new and concerning symptoms before your next visit, please call to see me sooner.

## 2023-10-05 NOTE — Progress Notes (Signed)
 Gynecologic Oncology Return Clinic Visit  10/05/23  Reason for Visit: surveillance  Treatment History: Oncology History  [name], right (HCC)  11/15/2021 Imaging   CT A/P: 1. Large volume ascites with peritoneal thickening and omental caking in the left abdomen. Imaging features are consistent with metastatic disease. 2. Contiguous with the posterior uterine fundus and extending into the right adnexal space is a poorly marginated heterogeneous 9.6 x 8.8 x 9.4 cm mass. Imaging features are consistent with neoplasm likely of right ovarian etiology. Associated heterogeneously enhancing mass involving the posterior lower uterine segment and cervix extending posteriorly towards the cul-de-sac. This may be a drop metastasis. 3. Soft tissue fullness also noted left adnexal region without normal left ovary by CT. 4. Subtle irregularity of liver contour raising the question of but not definitive for cirrhosis. 5. Small left and tiny right pleural effusions. 6. Aortic Atherosclerosis (ICD10-I70.0).   11/25/2021 Imaging   CT chest 1. Bilateral pleural effusions greatest on the LEFT both small volume but with fissural nodularity along the major fissure in the LEFT chest. Early involvement in the chest is not excluded. Sampling of pleural fluid may be helpful. 2. No adenopathy by size criteria. Tiny pulmonary nodule in the LEFT lower lobe as described. 3. Three-vessel coronary artery disease greatest in LEFT coronary circulation. 4. Reduction in malignant ascites since previous imaging following paracentesis.   12/01/2021 Initial Diagnosis   [name], right (HCC)   12/01/2021 Cancer Staging   Staging form: Ovary, Fallopian Tube, and Primary Peritoneal Carcinoma, AJCC 8th Edition - Clinical stage from 12/01/2021: FIGO Stage IIIC (cT3c, cN1b, cM0) - Signed by Timmy Maude SAUNDERS, MD on 12/01/2021 Stage prefix: Initial diagnosis Histologic grade (G): G2 Histologic grading system: 4 grade  system   12/07/2021 - 06/29/2022 Chemotherapy   Patient is on Treatment Plan : OVARIAN Carboplatin  (AUC 6) + Paclitaxel  (175) q21d X 6 Cycles     01/11/2022 - 06/08/2022 Chemotherapy   Patient is on Treatment Plan : OVARIAN Bevacizumab  q21d      Genetic Testing   Negative genetic testing. No pathogenic variants identified on the [facility] Common Hereditary Cancers+RNA panel. The report date is 01/24/2022.  The Common Hereditary Cancers Panel + RNA offered by [facility] includes sequencing and/or deletion duplication testing of the following 47 genes: APC, ATM, AXIN2, BARD1, BMPR1A, BRCA1, BRCA2, BRIP1, CDH1, CDKN2A (p14ARF), CDKN2A (p16INK4a), CKD4, CHEK2, CTNNA1, DICER1, EPCAM (Deletion/duplication testing only), GREM1 (promoter region deletion/duplication testing only), KIT, MEN1, MLH1, MSH2, MSH3, MSH6, MUTYH, NBN, NF1, NHTL1, PALB2, PDGFRA, PMS2, POLD1, POLE, PTEN, RAD50, RAD51C, RAD51D, SDHB, SDHC, SDHD, SMAD4, SMARCA4. STK11, TP53, TSC1, TSC2, and VHL.  The following genes were evaluated for sequence changes only: SDHA and HOXB13 c.251G>A variant only.   03/08/2022 Imaging   CT C/A/P: 1. Reduction in the omental caking of tumor compared to 11/15/2021, although substantial tumor remains. 2. The right adnexal mass has a volume of 220 cubic cm and appears to be primarily solid, although components may be hemorrhagic. Previous ascites has convinced into a loculated collection of fluid surrounding this adnexal mass and measuring 1400 cc. 3. Questionable bowel wall thickening in the transverse colon, although much of the appearance may be due to nondistention. Correlate with any signs of colitis. 4. Previous right pleural effusion has resolved. Residual small left pleural effusion is nonspecific for transudative or exudative etiology. 5. Other imaging findings of potential clinical significance: Substantial left anterior descending coronary artery atherosclerosis. Grade 1 degenerative  anterolisthesis L4-5. Degenerative facet arthropathy at L4-5  and L5-S1. Old healed right posterior rib fractures. Small calcifications along the pleural margin of the left hemidiaphragm, possibly from prior asbestos exposure or prior pleurodesis. 6. Aortic atherosclerosis.   04/20/2022 Surgery   Diagnostic laparoscopy, exploratory laparotomy with lysis of adhesions for approximately 45 minutes, omentectomy including mobilization of the splenic flexure, radical tumor debulking with en bloc resection of bilateral adnexa, uterus, cervix and rectosigmoid colon, end-to-end colonic reanastomosis, diverting loop ileostomy, cystoscopy   Findings: On EUA, fixed pelvic mass with nodularity appreciated within the posterior cul-de-sac.  On intra-abdominal entry the laparoscope, upper abdominal adhesions limiting assessment of the left upper quadrant.  Large, cystic mass fills the mid abdomen.  On laparotomy, approximately 22 cm cystic mass spans from the deep pelvis to the upper abdomen with a rind.  This lesion appears to be walling off the intra-abdominal ascites.  Multiple loops of small bowel are adherent to the cystic lesion.  There is significant filmy adhesive disease between loops of small bowel, the bowel and cystic lesion, and the small bowel and anterior abdominal wall.  Also numerous less than 1 cm white nodules noted along the small bowel and mesentery (frequently within filmy adhesions), most consistent with treated tumor.  During lysis of adhesions, 2 areas of serosal injury noted along the ileum, oversewn.  Approximately 4-6 cm tumor implant within the greater omentum and omental nodularity extending along the omentum to the splenic flexure.  Lesser sac without disease.  Stomach normal in appearance.,  Adhesions between the liver and the anterior abdominal wall bilaterally, no nodularity appreciated along either the liver or diaphragm.  Appendix itself normal-appearing, appendiceal mesentery minimally  adherent to the right adnexa.  Normal cecum, ascending colon, transverse colon, and descending colon.  Large cystic mass noted to be adherent to the bladder peritoneum and uterus anteriorly within the pelvis.  Left adnexa is somewhat distorted.  Right ovary replaced by an 8 cm cystic mass.  Normal-appearing distal right fallopian fimbria.  Area of the sigmoid colon adherent posterior to the right adnexa, requiring an en bloc resection.  Somewhat woody texture palpated along the rectovaginal septum after reverse hysterectomy performed.  Negative bubble test after rectosigmoid anastomosis.  In the setting of planned additional treatment and recent receipt of bevacizumab , decision made to perform diverting loop ileostomy. Multiple adhesions and nodular implants sent.  If these are negative for malignancy or show treated tumor, then this was an R0 resection.  Otherwise, R1 resection. On cystoscopy, bladder dome intact. Good efflux seen from bilateral ureteral orifices.    04/20/2022 Pathology Results   A. SMALL BOWEL ADHESION, EXCISION: -Fibrous tissue with chronic inflammation, consistent with adhesions  B. SMALL BOWEL NODULE, EXCISION: - Benign fibrotic nodule - Negative for carcinoma  C. UTERUS, CERVIX, BILATERAL TUBES AND OVARIES, RECTOSIGMOID COLON, RESECTION: - Cervix: Benign, nabothian cyst - Endometrium: Benign inactive endometrium - Myometrium: No significant pathologic changes - Ovary: High-grade serous carcinoma - Colon: Metastatic serous carcinoma involving colonic serosa - See oncology table  D. OMENTUM, EXCISION: - Involved by high-grade serous carcinoma  E. RECTAL DONUTS, EXCISION: - Benign colonic mucosa with no significant pathologic changes   ONCOLOGY TABLE:  OVARY or FALLOPIAN TUBE or PRIMARY PERITONEUM: Resection  Procedure: Total hysterectomy, rectosigmoid colon resection, omental excision, adhesiolysis Specimen Integrity: Disrupted Tumor Site: Ovary Tumor Size:  10.5 x 7.5 x 5 cm Histologic Type: serous carcinoma Histologic Grade: High-grade Ovarian Surface Involvement: See comment Fallopian Tube Surface Involvement: Not identified Implants (required for advanced stage serous/seromucinous borderline tumors  only): Invasive implants Other Tissue/ Organ Involvement: Colonic serosa Largest Extrapelvic Peritoneal Focus: 8 cm (omental implant) Peritoneal/Ascitic Fluid Involvement: Present Chemotherapy Response Score (CRS): CRS2 Regional Lymph Nodes: Not applicable       Distant Metastasis:      Distant Site(s) Involved: None Pathologic Stage Classification (pTNM, AJCC 8th Edition): pT3c, pN[not assigned] Ancillary Studies: Can be performed upon request Representative Tumor Block: C11 Comment(s): Assessment of ovarian surface involvement is limited by disrupted and necrotic nature of the specimen. The tumor is located in the right ovary per the surgeon, an additional ovary and fallopian tube are not identified. An additional section is submitted to identify possible fallopian tube.      Interval History: Overall had been doing well but in the last hour, has had nausea, emesis, and dizziness.  Also developed some diarrhea.  Prior to this afternoon, was feeling well.  Endorses normal bowel function.  Denies any bladder symptoms.  She is scheduled for radiation again this afternoon.  She will complete remaining 6 fractions of her EBRT today/tomorrow and next week.  Has occasional abdominal twinges around her stoma scar as well as in her pelvis on the right.  Denies any vaginal bleeding, has noticed some intermittent vaginal discharge since she ran out of vaginal estrogen 1-2 months ago.  Past Medical/Surgical History: Past Medical History:  Diagnosis Date   Anemia    Complication of anesthesia    Dyspnea    GERD (gastroesophageal reflux disease)    Goals of care, counseling/discussion 12/01/2021   Hypercholesterolemia    Hypertension    IBS  (irritable bowel syndrome)    Ileostomy in place [facility])    Malignant ascites 11/19/2021   Neuromuscular disorder (HCC)    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   Osteopenia    Osteoporosis    [name], right (HCC) 12/01/2021   Ovarian mass, right 11/19/2021   PONV (postoperative nausea and vomiting)    Toe fracture    lt small toe    Past Surgical History:  Procedure Laterality Date   BOWEL RESECTION N/A 04/20/2022   Procedure: BOWEL RESECTION; DIVERTING ILEOSTOMY;  Surgeon: Viktoria Comer SAUNDERS, MD;  Location: [facility];  Service: Gynecology;  Laterality: N/A;   DEBULKING N/A 04/20/2022   Procedure: TUMOR DEBULKING, OMENTECTOMY;  Surgeon: Viktoria Comer SAUNDERS, MD;  Location: [facility];  Service: Gynecology;  Laterality: N/A;   HYSTERECTOMY ABDOMINAL WITH SALPINGO-OOPHORECTOMY Bilateral 04/20/2022   Procedure: HYSTERECTOMY ABDOMINAL WITH BILATERAL SALPINGO-OOPHORECTOMY;  Surgeon: Viktoria Comer SAUNDERS, MD;  Location: [facility];  Service: Gynecology;  Laterality: Bilateral;   ILEOSTOMY CLOSURE N/A 09/07/2022   Procedure: ILEOSTOMY TAKEDOWN;  Surgeon: Viktoria Comer SAUNDERS, MD;  Location: [facility];  Service: General;  Laterality: N/A;   IR IMAGING GUIDED PORT INSERTION  11/26/2021   IR PARACENTESIS  11/23/2021   IR PARACENTESIS  11/26/2021   LAPAROSCOPY N/A 04/20/2022   Procedure: LAPAROSCOPY DIAGNOSTIC,CYSTO;  Surgeon: Viktoria Comer SAUNDERS, MD;  Location: [facility];  Service: Gynecology;  Laterality: N/A;   lump removal  04/11/1993   axilla right - benign   TONSILLECTOMY     TUBAL LIGATION  04/11/1988    Family History  Problem Relation Age of Onset   Stroke Mother    Hypertension Mother    Hyperlipidemia Mother    Glaucoma Mother    Heart attack Father 44   Diabetes Father    Hypertension Father    Hyperlipidemia Father    Cataracts Father    Breast cancer Cousin  Colon cancer Neg Hx    Ovarian cancer Neg Hx    Endometrial cancer Neg Hx    Pancreatic cancer Neg Hx    Prostate cancer Neg Hx     Social  History   Socioeconomic History   Marital status: Widowed    Spouse name: Not on file   Number of children: 1   Years of education: 14   Highest education level: Associate degree: occupational, Scientist, product/process development, or vocational program  Occupational History   Occupation: retired Designer, industrial/product  Tobacco Use   Smoking status: Former    Current packs/day: 0.00    Average packs/day: 1.5 packs/day for 20.0 years (30.0 ttl pk-yrs)    Types: Cigarettes    Start date: 03/20/1970    Quit date: 03/20/1990    Years since quitting: 33.5   Smokeless tobacco: Never   Tobacco comments:    quit 1988  Vaping Use   Vaping status: Never Used  Substance and Sexual Activity   Alcohol use: Not Currently    Alcohol/week: 2.0 standard drinks of alcohol    Types: 2 Glasses of wine per week    Comment: Wine in the evenings   Drug use: No   Sexual activity: Not Currently    Partners: Male  Other Topics Concern   Not on file  Social History Narrative   Lives alone with her dog. Helps with her grandson pick up and volunteering at his school. She enjoys spending time with her family.   Social Drivers of Corporate investment banker Strain: Low Risk  (04/06/2023)   Overall Financial Resource Strain (CARDIA)    Difficulty of Paying Living Expenses: Not hard at all  Food Insecurity: No Food Insecurity (09/18/2023)   Hunger Vital Sign    Worried About Running Out of Food in the Last Year: Never true    Ran Out of Food in the Last Year: Never true  Transportation Needs: No Transportation Needs (09/18/2023)   PRAPARE - Administrator, Civil Service (Medical): No    Lack of Transportation (Non-Medical): No  Physical Activity: Unknown (04/06/2023)   Exercise Vital Sign    Days of Exercise per Week: 3 days    Minutes of Exercise per Session: Patient declined  Stress: No Stress Concern Present (04/06/2023)   [employer] of Occupational Health - Occupational Stress Questionnaire    Feeling of Stress :  Only a little  Social Connections: Socially Isolated (04/06/2023)   Social Connection and Isolation Panel    Frequency of Communication with Friends and Family: More than three times a week    Frequency of Social Gatherings with Friends and Family: More than three times a week    Attends Religious Services: Never    Database administrator or Organizations: No    Attends Banker Meetings: Not on file    Marital Status: Widowed    Current Medications:  Current Outpatient Medications:    amLODipine  (NORVASC ) 2.5 MG tablet, Take 1 tablet (2.5 mg total) by mouth daily., Disp: 90 tablet, Rfl: 3   amoxicillin  (AMOXIL ) 500 MG tablet, Take 2,000 mg by mouth once. Take 1 hour prior to dental procedure., Disp: , Rfl:    atorvastatin  (LIPITOR) 40 MG tablet, TAKE ONE TABLET BY MOUTH EVERY NIGHT AT BEDTIME, Disp: 90 tablet, Rfl: 3   Biotin 10000 MCG TABS, Take 10,000 mcg by mouth daily., Disp: , Rfl:    Calcium -Vitamin D -Vitamin K (VIACTIV PO), Take 1 tablet by mouth  in the morning and at bedtime., Disp: , Rfl:    cholecalciferol (VITAMIN D3) 25 MCG (1000 UNIT) tablet, Take 1,000 Units by mouth daily., Disp: , Rfl:    [START ON 10/06/2023] conjugated estrogens (PREMARIN) vaginal cream, Place 1 Applicatorful vaginally 3 (three) times a week., Disp: 42.5 g, Rfl: 3   lidocaine -prilocaine  (EMLA ) cream, Apply a dime size of cream to the port-a-cath 1-2 hours prior to access. Cover with [employer]., Disp: 30 g, Rfl: 3   magnesium  oxide (MAG-OX) 400 (240 Mg) MG tablet, Take 1 tablet (400 mg total) by mouth 2 (two) times daily., Disp: 60 tablet, Rfl: 5   meloxicam  (MOBIC ) 15 MG tablet, One tab PO every 24 hours with a meal for 2 weeks, then once every 24 hours prn pain., Disp: 30 tablet, Rfl: 3   olaparib  (LYNPARZA ) 150 MG tablet, Take 2 tablets (300 mg total) by mouth 2 (two) times daily. Swallow whole. May take with food to decrease nausea and vomiting. Take as instructed per MD, Disp: 120 tablet,  Rfl: 3   omeprazole  (PRILOSEC ) 20 MG capsule, TAKE 1 CAPSULE BY MOUTH DAILY, Disp: 90 capsule, Rfl: 2   ondansetron  (ZOFRAN ) 8 MG tablet, Take 1 tablet (8 mg total) by mouth every 8 (eight) hours as needed for nausea or vomiting., Disp: 20 tablet, Rfl: 0   senna (SENOKOT) 8.6 MG tablet, Take 1 tablet by mouth daily., Disp: , Rfl:    TYLENOL  500 MG tablet, Take 500-1,000 mg by mouth every 6 (six) hours as needed for mild pain or headache., Disp: , Rfl:   Current Facility-Administered Medications:    zoledronic  acid (RECLAST ) injection 5 mg, 5 mg, Intravenous, Once,   Review of Systems: + Fatigue, diarrhea, dizziness Denies appetite changes, fevers, chills, unexplained weight changes. Denies hearing loss, neck lumps or masses, mouth sores, ringing in ears or voice changes. Denies cough or wheezing.  Denies shortness of breath. Denies chest pain or palpitations. Denies leg swelling. Denies abdominal distention, pain, blood in stools, constipationor early satiety. Denies pain with intercourse, dysuria, frequency, hematuria or incontinence. Denies hot flashes, pelvic pain, vaginal bleeding or vaginal discharge.   Denies joint pain, back pain or muscle pain/cramps. Denies itching, rash, or wounds. Denies headaches, numbness or seizures. Denies swollen lymph nodes or glands, denies easy bruising or bleeding. Denies anxiety, depression, confusion, or decreased concentration.  Physical Exam: BP 132/68 Comment: manual recheck, MD aware  Pulse 68   Temp 97.6 F (36.4 C) (Oral)   Resp 16   Ht 5' 3 (1.6 m)   Wt 112 lb (50.8 kg)   SpO2 100%   BMI 19.84 kg/m  General: Alert, oriented, no acute distress. HEENT: Normocephalic, atraumatic, sclera anicteric. Chest: Unlabored breathing on room air.  No wheezes or rhonchi.  Lungs clear to auscultation bilaterally. Cardiovascular: Regular rate and rhythm, no murmurs or rubs appreciated. Abdomen: Soft, nondistended, nontender.  Well-healed  incisions.  Normoactive bowel sounds. Skin: No rashes or lesions noted. Lymph nodes: No submandibular, supraclavicular, or inguinal adenopathy. Extremities: Grossly normal range of motion.  Warm, well perfused.  No edema bilaterally.   GU: Vaginal mucosa is mildly atrophic, vaginal cuff intact without lesions noted.  On bimanual exam, no masses or nodularity.  This is confirmed on rectovaginal exam.  Laboratory & Radiologic Studies:          Component Ref Range & Units (hover) 2 wk ago 1 mo ago 3 mo ago 4 mo ago 6 mo ago 7 mo ago 8  mo ago  Cancer Antigen (CA) 125 20.6 19.0 CM 16.2 CM 13.1 CM 14.4 CM 11.1 CM 12.5 CM     Assessment & Plan: Maria Romero is a 73 y.o. 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 Ileostomy was reversed in early 09/2022. Germline testing negative. HRD+. Maintenance Olaparib  starting 09/2022.  Patient is doing very well and is NED on exam today.  Recent CA-125 was normal.   She was noted to have a peritoneal nodule anterior to the right lobe of the liver that had increased in size, on review of the imaging today, I do not see this on CT scan in April 2024, but there is a nodule there in September 2024 that measures approximately 2 x 1.5 cm.  On subsequent imaging in March of this year, the lesion measured 2.1 x 1.3 cm and in May 2.4 x 1.3 cm.  The nodule itself has been stable to have slightly increased in size over approximately 1 year.  While her CA125 is normal, there has been slow steady increase from 13-20 over a several month period.  Decision was made with her medical oncologist to proceed with radiation of the presumed metastatic peritoneal implant.  She also has a solid pulmonary nodule in the superior segment of the left lower lobe that has remained relatively stable in size.   I discussed all of the above with the patient including the fact that there has  been relatively little change in size of this right upper quadrant lesion in approximately 9 months.  Interestingly, looks more cystic over time as if there has been some necrosis.  She was asymptomatic with regard to this lesion and if this represented persistent or recurrent disease, PARPi therapy seemed to be keeping disease stable.   Given symptoms today, which may be related to radiation treatment as well as warm temperatures and poor p.o. intake, plan to get labs.   She will continue to follow with her medical oncologist closely.  I will plan to see her back in 4-5 months.  Discussed signs and symptoms that should prompt a phone call between visits.  New prescription for vaginal estrogen sent to her pharmacy.  28 minutes of total time was spent for this patient encounter, including preparation, face-to-face counseling with the patient and coordination of care, and documentation of the encounter.  Comer Dollar, MD  Division of Gynecologic Oncology  Department of Obstetrics and Gynecology  [facility] [place]

## 2023-10-06 ENCOUNTER — Ambulatory Visit
Admission: RE | Admit: 2023-10-06 | Discharge: 2023-10-06 | Disposition: A | Discharge status: Home / Self Care | Source: Ambulatory Visit | Attending: Radiation Oncology | Admitting: Radiation Oncology

## 2023-10-06 ENCOUNTER — Ambulatory Visit

## 2023-10-06 ENCOUNTER — Ambulatory Visit: Payer: Self-pay | Admitting: *Deleted

## 2023-10-06 ENCOUNTER — Encounter: Payer: Self-pay | Admitting: Medical-Surgical

## 2023-10-06 ENCOUNTER — Other Ambulatory Visit: Payer: Self-pay

## 2023-10-06 ENCOUNTER — Ambulatory Visit (INDEPENDENT_AMBULATORY_CARE_PROVIDER_SITE_OTHER): Admitting: Medical-Surgical

## 2023-10-06 VITALS — BP 121/65 | HR 71 | Resp 20 | Ht 63.0 in | Wt 110.0 lb

## 2023-10-06 DIAGNOSIS — Z9221 Personal history of antineoplastic chemotherapy: Secondary | ICD-10-CM | POA: Diagnosis not present

## 2023-10-06 DIAGNOSIS — C786 Secondary malignant neoplasm of retroperitoneum and peritoneum: Secondary | ICD-10-CM | POA: Diagnosis not present

## 2023-10-06 DIAGNOSIS — Z510 Encounter for antineoplastic radiation therapy: Secondary | ICD-10-CM | POA: Diagnosis not present

## 2023-10-06 DIAGNOSIS — H65191 Other acute nonsuppurative otitis media, right ear: Secondary | ICD-10-CM

## 2023-10-06 DIAGNOSIS — R42 Dizziness and giddiness: Secondary | ICD-10-CM | POA: Diagnosis not present

## 2023-10-06 DIAGNOSIS — H6121 Impacted cerumen, right ear: Secondary | ICD-10-CM | POA: Diagnosis not present

## 2023-10-06 DIAGNOSIS — Z79899 Other long term (current) drug therapy: Secondary | ICD-10-CM | POA: Diagnosis not present

## 2023-10-06 DIAGNOSIS — J986 Disorders of diaphragm: Secondary | ICD-10-CM | POA: Diagnosis not present

## 2023-10-06 LAB — RAD ONC ARIA SESSION SUMMARY
Course Elapsed Days: 4
Plan Fractions Treated to Date: 5
Plan Prescribed Dose Per Fraction: 5 Gy
Plan Total Fractions Prescribed: 10
Plan Total Prescribed Dose: 50 Gy
Reference Point Dosage Given to Date: 25 Gy
Reference Point Session Dosage Given: 5 Gy
Session Number: 5

## 2023-10-06 MED ORDER — MECLIZINE HCL 25 MG PO TABS: 25.0000 mg | ORAL_TABLET | Freq: Three times a day (TID) | ORAL | 0 refills | Status: AC | PRN

## 2023-10-06 NOTE — Progress Notes (Signed)
        Established patient visit  History, exam, impression, and plan:  1. Vertigo (Primary) 2. Acute MEE (middle ear effusion), right Very pleasant 73 year old female presenting today with reports of an episode of lightheadedness that occurred yesterday accompanied by nausea, vomiting, and a little diarrhea.  She is currently being treated with chemotherapy and radiation for metastatic cancer.  She got up yesterday and was getting ready to go to her appointment.  She noted she had to go to the restroom but when she went to stand up from the toilet, she was struck by significant lightheadedness and feelings of being off balance.  She was able to get to the door and call for help.  She immediately laid down and her symptoms improved.  When she got back up out of the bed, the dizziness returned.  This time it was with nausea and she began to dry heave.  This continued throughout the trip to the cancer center where she began to vomit small amounts of bile.  After her visit to the oncologist and associated treatment, she reports that she was extremely fatigued and wiped out when she got home.  She laid down to rest and was able to get sleep last night.  When she woke this morning, she experienced the off-balance/lightheadedness sensation when she got out of bed so maybe appointment today.  Notes that she has had tinnitus bilaterally for years that goes along with TMJ.  Feels like her right ear is clogged and has been this way for about a week and a half.  No other associated symptoms.  On exam, she is alert, oriented x 4, NAD.   Vital signs are stable with blood pressure of 121/65.  On evaluation, her right ear canal is blocked by cerumen.  Left ear canal and TM normal.  Discussed recommendations for irrigation which she is agreeable to.  On reevaluation, the right ear canal is clear but her TM is slightly bulging with cloudy middle ear effusion.  Symptoms are consistent with vertigo so plan for adding meclizine  25 mg 3 times daily as needed.  Instructions given on performing the Epley maneuver.  Start corticosteroid nasal spray to help with middle ear effusion.  Advised that this may take weeks to clear for the ear symptoms.  If meclizine is not helpful, advised her to reach back out to the office.  3. Impacted cerumen of right ear  Indication: Cerumen impaction of the ear(s) Medical necessity statement: On physical examination, cerumen impairs clinically significant portions of the external auditory canal, and tympanic membrane. Noted obstructive, copious cerumen that cannot be removed without magnification and instrumentations  Consent: Discussed benefits and risks of procedure and verbal consent obtained Procedure: Patient was prepped for the procedure. Utilized an otoscope to assess and take note of the ear canal, the tympanic membrane, and the presence, amount, and placement of the cerumen. Gentle water  irrigation and soft plastic curette was utilized to remove cerumen.  Post procedure examination: shows cerumen was completely removed. Patient tolerated procedure well. The patient is made aware that they may experience temporary vertigo, temporary hearing loss, and temporary discomfort. If these symptom last for more than 24 hours to call the clinic or proceed to the ED.  Procedures performed this visit: None.  Return if symptoms worsen or fail to improve.  __________________________________ Zada FREDRIK Palin, DNP, APRN, FNP-BC Primary Care and Sports Medicine [facility]

## 2023-10-06 NOTE — Patient Instructions (Signed)
 How to Perform the Epley Maneuver The Epley maneuver is an exercise that relieves symptoms of vertigo. Vertigo is the feeling that you or your surroundings are moving when they are not. When you feel vertigo, you may feel like the room is spinning and may have trouble walking. The Epley maneuver is used for a type of vertigo caused by a calcium deposit in a part of the inner ear. The maneuver involves changing head positions to help the deposit move out of the area. You can do this maneuver at home whenever you have symptoms of vertigo. You can repeat it in 24 hours if your vertigo has not gone away. Even though the Epley maneuver may relieve your vertigo for a few weeks, it is possible that your symptoms will return. This maneuver relieves vertigo, but it does not relieve dizziness. What are the risks? If it is done correctly, the Epley maneuver is considered safe. Sometimes it can lead to dizziness or nausea that goes away after a short time. If you develop other symptoms--such as changes in vision, weakness, or numbness--stop doing the maneuver and call your health care provider. Supplies needed: A bed or table. A pillow. How to do the Epley maneuver     Sit on the edge of a bed or table with your back straight and your legs extended or hanging over the edge of the bed or table. Turn your head halfway toward the affected ear or side as told by your health care provider. Lie backward quickly with your head turned until you are lying flat on your back. Your head should dangle (head-hanging position). You may want to position a pillow under your shoulders. Hold this position for at least 30 seconds. If you feel dizzy or have symptoms of vertigo, continue to hold the position until the symptoms stop. Turn your head to the opposite direction until your unaffected ear is facing down. Your head should continue to dangle. Hold this position for at least 30 seconds. If you feel dizzy or have symptoms of  vertigo, continue to hold the position until the symptoms stop. Turn your whole body to the same side as your head so that you are positioned on your side. Your head will now be nearly facedown and no longer needs to dangle. Hold for at least 30 seconds. If you feel dizzy or have symptoms of vertigo, continue to hold the position until the symptoms stop. Sit back up. You can repeat the maneuver in 24 hours if your vertigo does not go away. Follow these instructions at home: For 24 hours after doing the Epley maneuver: Keep your head in an upright position. When lying down to sleep or rest, keep your head raised (elevated) with two or more pillows. Avoid excessive neck movements. Activity Do not drive or use machinery if you feel dizzy. After doing the Epley maneuver, return to your normal activities as told by your health care provider. Ask your health care provider what activities are safe for you. General instructions Drink enough fluid to keep your urine pale yellow. Do not drink alcohol. Take over-the-counter and prescription medicines only as told by your health care provider. Keep all follow-up visits. This is important. Preventing vertigo symptoms Ask your health care provider if there is anything you should do at home to prevent vertigo. He or she may recommend that you: Keep your head elevated with two or more pillows while you sleep. Do not sleep on the side of your affected ear. Get  up slowly from bed. Avoid sudden movements during the day. Avoid extreme head positions or movement, such as looking up or bending over. Contact a health care provider if: Your vertigo gets worse. You have other symptoms, including: Nausea. Vomiting. Headache. Get help right away if you: Have vision changes. Have a headache or neck pain that is severe or getting worse. Cannot stop vomiting. Have new numbness or weakness in any part of your body. These symptoms may represent a serious problem  that is an emergency. Do not wait to see if the symptoms will go away. Get medical help right away. Call your local emergency services (911 in the [place] Do not drive yourself to the hospital. Summary Vertigo is the feeling that you or your surroundings are moving when they are not. The Epley maneuver is an exercise that relieves symptoms of vertigo. If the Epley maneuver is done correctly, it is considered safe. This information is not intended to replace advice given to you by your health care provider. Make sure you discuss any questions you have with your health care provider. Document Revised: 12/23/2022 Document Reviewed: 12/23/2022 Elsevier Patient Education  2024 [employer].

## 2023-10-06 NOTE — Telephone Encounter (Signed)
  FYI Only or Action Required?: FYI only for provider.  Patient was last seen in primary care on 04/11/2023 by Curtis Debby PARAS, MD. Called Nurse Triage reporting Dizziness (Off balance, nausea, vomiting, diarrhea, ear clogged). Symptoms began several weeks ago. Interventions attempted: Nothing. Symptoms are: unchanged.  Triage Disposition: See PCP When Office is Open (Within 3 Days)  Patient/caregiver understands and will follow disposition?: yes   Reason for Disposition  Ear congestion present > 48 hours  Answer Assessment - Initial Assessment Questions Patient presently undergoing radiation and chemo treatment- she has bad episode of vertigo, n/v,diarrhea yesterday and believes it may be related to her ear- feels clogged. She is requesting that it be checked.    1. LOCATION: Which ear is involved?       Right ear 2. SENSATION: Describe how the ear feels. (e.g. stuffy, full, plugged).      Plugged up 3. ONSET:  When did the ear symptoms start?       1-2 weeks 4. PAIN: Do you also have an earache? If Yes, ask: How bad is it? (Scale 1-10; or mild, moderate, severe)     Last night a little discomfort 5. CAUSE: What do you think is causing the ear congestion?     Not sure 6. URI: Do you have a runny nose or cough?      no 7. NASAL ALLERGIES: Are there symptoms of hay fever, such as sneezing or a clear nasal discharge?     no  Protocols used: Ear - Congestion-A-AH    Copied from CRM [id number]. Topic: Clinical - Red Word Triage >> Oct 06, 2023  9:06 AM Cherylann RAMAN wrote: Red Word that prompted transfer to Nurse Triage: Patient called in with complaints of a clogged ear with severe vertigo, nauseated, diarrhea, and vomiting. Patient believes it is being caused by her radiation treatment.

## 2023-10-06 NOTE — Telephone Encounter (Signed)
 FYI - the patient was seen today by NP Joy for the following symptoms.

## 2023-10-09 ENCOUNTER — Other Ambulatory Visit: Payer: Self-pay

## 2023-10-09 ENCOUNTER — Ambulatory Visit
Admission: RE | Admit: 2023-10-09 | Discharge: 2023-10-09 | Disposition: A | Discharge status: Home / Self Care | Source: Ambulatory Visit | Attending: Radiation Oncology | Admitting: Radiation Oncology

## 2023-10-09 DIAGNOSIS — J986 Disorders of diaphragm: Secondary | ICD-10-CM | POA: Diagnosis not present

## 2023-10-09 DIAGNOSIS — R42 Dizziness and giddiness: Secondary | ICD-10-CM | POA: Diagnosis not present

## 2023-10-09 DIAGNOSIS — Z510 Encounter for antineoplastic radiation therapy: Secondary | ICD-10-CM | POA: Diagnosis not present

## 2023-10-09 DIAGNOSIS — Z9221 Personal history of antineoplastic chemotherapy: Secondary | ICD-10-CM | POA: Diagnosis not present

## 2023-10-09 DIAGNOSIS — C786 Secondary malignant neoplasm of retroperitoneum and peritoneum: Secondary | ICD-10-CM | POA: Diagnosis not present

## 2023-10-09 DIAGNOSIS — Z79899 Other long term (current) drug therapy: Secondary | ICD-10-CM | POA: Diagnosis not present

## 2023-10-09 LAB — RAD ONC ARIA SESSION SUMMARY
Course Elapsed Days: 7
Plan Fractions Treated to Date: 6
Plan Prescribed Dose Per Fraction: 5 Gy
Plan Total Fractions Prescribed: 10
Plan Total Prescribed Dose: 50 Gy
Reference Point Dosage Given to Date: 30 Gy
Reference Point Session Dosage Given: 5 Gy
Session Number: 6

## 2023-10-10 ENCOUNTER — Ambulatory Visit
Admission: RE | Admit: 2023-10-10 | Discharge: 2023-10-10 | Disposition: A | Discharge status: Home / Self Care | Source: Ambulatory Visit | Attending: Radiation Oncology | Admitting: Radiation Oncology

## 2023-10-10 ENCOUNTER — Other Ambulatory Visit: Payer: Self-pay

## 2023-10-10 DIAGNOSIS — Z90722 Acquired absence of ovaries, bilateral: Secondary | ICD-10-CM | POA: Diagnosis not present

## 2023-10-10 DIAGNOSIS — J986 Disorders of diaphragm: Secondary | ICD-10-CM | POA: Diagnosis not present

## 2023-10-10 DIAGNOSIS — C561 Malignant neoplasm of right ovary: Secondary | ICD-10-CM | POA: Insufficient documentation

## 2023-10-10 DIAGNOSIS — Z510 Encounter for antineoplastic radiation therapy: Secondary | ICD-10-CM | POA: Insufficient documentation

## 2023-10-10 DIAGNOSIS — R42 Dizziness and giddiness: Secondary | ICD-10-CM | POA: Diagnosis not present

## 2023-10-10 DIAGNOSIS — Z79899 Other long term (current) drug therapy: Secondary | ICD-10-CM | POA: Insufficient documentation

## 2023-10-10 DIAGNOSIS — N952 Postmenopausal atrophic vaginitis: Secondary | ICD-10-CM | POA: Diagnosis not present

## 2023-10-10 DIAGNOSIS — Z79890 Hormone replacement therapy (postmenopausal): Secondary | ICD-10-CM | POA: Diagnosis not present

## 2023-10-10 DIAGNOSIS — C786 Secondary malignant neoplasm of retroperitoneum and peritoneum: Secondary | ICD-10-CM | POA: Diagnosis not present

## 2023-10-10 DIAGNOSIS — Z90710 Acquired absence of both cervix and uterus: Secondary | ICD-10-CM | POA: Insufficient documentation

## 2023-10-10 DIAGNOSIS — Z9079 Acquired absence of other genital organ(s): Secondary | ICD-10-CM | POA: Insufficient documentation

## 2023-10-10 DIAGNOSIS — Z9221 Personal history of antineoplastic chemotherapy: Secondary | ICD-10-CM | POA: Insufficient documentation

## 2023-10-10 LAB — RAD ONC ARIA SESSION SUMMARY
Course Elapsed Days: 8
Plan Fractions Treated to Date: 7
Plan Prescribed Dose Per Fraction: 5 Gy
Plan Total Fractions Prescribed: 10
Plan Total Prescribed Dose: 50 Gy
Reference Point Dosage Given to Date: 35 Gy
Reference Point Session Dosage Given: 5 Gy
Session Number: 7

## 2023-10-11 ENCOUNTER — Other Ambulatory Visit: Payer: Self-pay

## 2023-10-11 ENCOUNTER — Ambulatory Visit
Admission: RE | Admit: 2023-10-11 | Discharge: 2023-10-11 | Disposition: A | Discharge status: Home / Self Care | Source: Ambulatory Visit | Attending: Radiation Oncology | Admitting: Radiation Oncology

## 2023-10-11 DIAGNOSIS — Z79899 Other long term (current) drug therapy: Secondary | ICD-10-CM | POA: Diagnosis not present

## 2023-10-11 DIAGNOSIS — R42 Dizziness and giddiness: Secondary | ICD-10-CM | POA: Diagnosis not present

## 2023-10-11 DIAGNOSIS — C786 Secondary malignant neoplasm of retroperitoneum and peritoneum: Secondary | ICD-10-CM | POA: Diagnosis not present

## 2023-10-11 DIAGNOSIS — Z510 Encounter for antineoplastic radiation therapy: Secondary | ICD-10-CM | POA: Diagnosis not present

## 2023-10-11 DIAGNOSIS — Z9221 Personal history of antineoplastic chemotherapy: Secondary | ICD-10-CM | POA: Diagnosis not present

## 2023-10-11 DIAGNOSIS — J986 Disorders of diaphragm: Secondary | ICD-10-CM | POA: Diagnosis not present

## 2023-10-11 LAB — RAD ONC ARIA SESSION SUMMARY
Course Elapsed Days: 9
Plan Fractions Treated to Date: 8
Plan Prescribed Dose Per Fraction: 5 Gy
Plan Total Fractions Prescribed: 10
Plan Total Prescribed Dose: 50 Gy
Reference Point Dosage Given to Date: 40 Gy
Reference Point Session Dosage Given: 5 Gy
Session Number: 8

## 2023-10-12 ENCOUNTER — Ambulatory Visit
Admission: RE | Admit: 2023-10-12 | Discharge: 2023-10-12 | Disposition: A | Discharge status: Home / Self Care | Source: Ambulatory Visit | Attending: Radiation Oncology | Admitting: Radiation Oncology

## 2023-10-12 ENCOUNTER — Other Ambulatory Visit: Payer: Self-pay

## 2023-10-12 DIAGNOSIS — C786 Secondary malignant neoplasm of retroperitoneum and peritoneum: Secondary | ICD-10-CM | POA: Diagnosis not present

## 2023-10-12 DIAGNOSIS — Z510 Encounter for antineoplastic radiation therapy: Secondary | ICD-10-CM | POA: Diagnosis not present

## 2023-10-12 DIAGNOSIS — Z79899 Other long term (current) drug therapy: Secondary | ICD-10-CM | POA: Diagnosis not present

## 2023-10-12 DIAGNOSIS — R42 Dizziness and giddiness: Secondary | ICD-10-CM | POA: Diagnosis not present

## 2023-10-12 DIAGNOSIS — J986 Disorders of diaphragm: Secondary | ICD-10-CM | POA: Diagnosis not present

## 2023-10-12 DIAGNOSIS — Z9221 Personal history of antineoplastic chemotherapy: Secondary | ICD-10-CM | POA: Diagnosis not present

## 2023-10-12 LAB — RAD ONC ARIA SESSION SUMMARY
Course Elapsed Days: 10
Plan Fractions Treated to Date: 9
Plan Prescribed Dose Per Fraction: 5 Gy
Plan Total Fractions Prescribed: 10
Plan Total Prescribed Dose: 50 Gy
Reference Point Dosage Given to Date: 45 Gy
Reference Point Session Dosage Given: 5 Gy
Session Number: 9

## 2023-10-13 ENCOUNTER — Ambulatory Visit

## 2023-10-16 ENCOUNTER — Other Ambulatory Visit: Payer: Self-pay

## 2023-10-16 ENCOUNTER — Ambulatory Visit
Admission: RE | Admit: 2023-10-16 | Discharge: 2023-10-16 | Disposition: A | Discharge status: Home / Self Care | Source: Ambulatory Visit | Attending: Radiation Oncology | Admitting: Radiation Oncology

## 2023-10-16 DIAGNOSIS — Z9221 Personal history of antineoplastic chemotherapy: Secondary | ICD-10-CM | POA: Diagnosis not present

## 2023-10-16 DIAGNOSIS — Z510 Encounter for antineoplastic radiation therapy: Secondary | ICD-10-CM | POA: Diagnosis not present

## 2023-10-16 DIAGNOSIS — J986 Disorders of diaphragm: Secondary | ICD-10-CM | POA: Diagnosis not present

## 2023-10-16 DIAGNOSIS — C786 Secondary malignant neoplasm of retroperitoneum and peritoneum: Secondary | ICD-10-CM | POA: Diagnosis not present

## 2023-10-16 DIAGNOSIS — R42 Dizziness and giddiness: Secondary | ICD-10-CM | POA: Diagnosis not present

## 2023-10-16 DIAGNOSIS — Z79899 Other long term (current) drug therapy: Secondary | ICD-10-CM | POA: Diagnosis not present

## 2023-10-16 LAB — RAD ONC ARIA SESSION SUMMARY
Course Elapsed Days: 14
Plan Fractions Treated to Date: 10
Plan Prescribed Dose Per Fraction: 5 Gy
Plan Total Fractions Prescribed: 10
Plan Total Prescribed Dose: 50 Gy
Reference Point Dosage Given to Date: 50 Gy
Reference Point Session Dosage Given: 5 Gy
Session Number: 10

## 2023-10-17 NOTE — Radiation Completion Notes (Signed)
  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) serous adenocarcinoma of the right ovary  Intent: Curative     ==========DELIVERED PLANS==========  First Treatment Date: 2023-10-02 Last Treatment Date: 2023-10-16   Plan Name: Abd_UHRT Site: Abdomen Technique: IMRT Mode: Photon Dose Per Fraction: 5 Gy Prescribed Dose (Delivered / Prescribed): 50 Gy / 50 Gy Prescribed Fxs (Delivered / Prescribed): 10 / 10     ==========ON TREATMENT VISIT DATES========== 2023-10-03, 2023-10-10     ==========UPCOMING VISITS========== 11/23/2023 [facility] PORT FLUSH W/LAB [facility] NURSE  11/23/2023 [facility] EST PT 15 Timmy Maude SAUNDERS, MD  11/23/2023 [facility] LAB [facility]  11/13/2023 [facility] FOLLOW UP 15 Shannon Agent, MD  11/06/2023 [facility] OFFICE VISIT Alvan Dorothyann BIRCH, MD  ====================================   The patient tolerated radiation. She developed moderate fatigue and experienced some vertigo throughout her treatment.    The patient will return in one month and will continue follow up with Dr. Timmy as well.      Ronita Due, PA-C

## 2023-10-20 ENCOUNTER — Encounter (INDEPENDENT_AMBULATORY_CARE_PROVIDER_SITE_OTHER): Payer: Self-pay

## 2023-10-20 ENCOUNTER — Other Ambulatory Visit: Payer: Self-pay

## 2023-10-20 ENCOUNTER — Other Ambulatory Visit: Payer: Self-pay | Admitting: Pharmacy Technician

## 2023-10-20 NOTE — Progress Notes (Signed)
 Specialty Pharmacy Refill Coordination Note  Maria Romero is a 73 y.o. female contacted today regarding refills of specialty medication(s) Olaparib  (LYNPARZA )   Patient requested (Patient-Rptd) Delivery   Delivery date: 11/08/23 Verified address: (Patient-Rptd) [address] [place] [address]   Medication will be filled on 11/07/23.

## 2023-11-06 ENCOUNTER — Ambulatory Visit (INDEPENDENT_AMBULATORY_CARE_PROVIDER_SITE_OTHER): Payer: Medicare Other | Admitting: Family Medicine

## 2023-11-06 ENCOUNTER — Encounter: Payer: Self-pay | Admitting: Family Medicine

## 2023-11-06 ENCOUNTER — Other Ambulatory Visit: Payer: Self-pay

## 2023-11-06 ENCOUNTER — Ambulatory Visit

## 2023-11-06 VITALS — BP 118/72 | HR 69 | Ht 63.0 in | Wt 109.1 lb

## 2023-11-06 DIAGNOSIS — R519 Headache, unspecified: Secondary | ICD-10-CM | POA: Diagnosis not present

## 2023-11-06 DIAGNOSIS — Z23 Encounter for immunization: Secondary | ICD-10-CM | POA: Diagnosis not present

## 2023-11-06 DIAGNOSIS — E7800 Pure hypercholesterolemia, unspecified: Secondary | ICD-10-CM

## 2023-11-06 DIAGNOSIS — H6991 Unspecified Eustachian tube disorder, right ear: Secondary | ICD-10-CM | POA: Diagnosis not present

## 2023-11-06 DIAGNOSIS — M542 Cervicalgia: Secondary | ICD-10-CM

## 2023-11-06 DIAGNOSIS — M47812 Spondylosis without myelopathy or radiculopathy, cervical region: Secondary | ICD-10-CM | POA: Diagnosis not present

## 2023-11-06 DIAGNOSIS — S0300XA Dislocation of jaw, unspecified side, initial encounter: Secondary | ICD-10-CM | POA: Diagnosis not present

## 2023-11-06 DIAGNOSIS — R7309 Other abnormal glucose: Secondary | ICD-10-CM | POA: Diagnosis not present

## 2023-11-06 DIAGNOSIS — M8580 Other specified disorders of bone density and structure, unspecified site: Secondary | ICD-10-CM | POA: Diagnosis not present

## 2023-11-06 NOTE — Progress Notes (Signed)
 Established Patient Office Visit  Subjective  Patient ID: Maria Romero, female    DOB: 07/26/1950  Age: 73 y.o. MRN: [id number]  Chief Complaint  Patient presents with   Hyperlipidemia    Cholesterol follow up    clicking sound Right ear    Patient states was seen by Zada Palin, NP in July  and had ear irrigation at that visit.    Temporomandibular Joint Pain    Patient states  had had bilateral TMJ issues since completing the recent 10 rounds of radiation. Now having neck pain with turing head to Right and left and up and down motions.    HPI  She finished her radiation and chemo pills on July 7.  Around July 26 she actually went to see Dr. Viktoria and had gone to the bathroom that morning and immediately after just started to feel like her balance and vision was off she got nauseated she actually vomited and even had a couple loose stools.  She was able to make it to her appointment that day.  She did follow-up the next day with one of the providers in our office and was diagnosed with vertigo she was given a prescription for meclizine  which she has used a couple of times and has kept it on her.  She also had impaction of the right ear and they did irrigate it out that day she is still getting some occasional clicking in that right ear but no pain.  Is more concerned because since finishing her radiation she has had some neck pain that feels like almost like a pressure in the very back of her head especially if she leans back and then tries to sit forward or if she has her head in flexion.  Struggling with TMJ - Patient states  had had bilateral TMJ issues since completing the recent 10 rounds of radiation. Now having neck pain with turing head to Right and left and up and down motio     ROS    Objective:     BP 118/72   Pulse 69   Ht 5' 3 (1.6 m)   Wt 109 lb 1 oz (49.5 kg)   SpO2 100%   BMI 19.32 kg/m    Physical Exam Vitals and nursing note reviewed.   Constitutional:      Appearance: Normal appearance.  HENT:     Head: Normocephalic and atraumatic.     Right Ear: Tympanic membrane, ear canal and external ear normal.     Left Ear: Ear canal and external ear normal.  Eyes:     Conjunctiva/sclera: Conjunctivae normal.  Cardiovascular:     Rate and Rhythm: Normal rate and regular rhythm.  Pulmonary:     Effort: Pulmonary effort is normal.     Breath sounds: Normal breath sounds.  Skin:    General: Skin is warm and dry.  Neurological:     Mental Status: She is alert.  Psychiatric:        Mood and Affect: Mood normal.      No results found for any visits on 11/06/23.    The 10-year ASCVD risk score (Arnett DK, et al., 2019) is: 15.9%    Assessment & Plan:   Problem List Items Addressed This Visit       Other   HYPERCHOLESTEROLEMIA - Primary   Due for updated lipid levels.  She is not interested in taking any type of statin but we are wanting to still monitor and  know where things are at.      Relevant Orders   Lipid Panel With LDL/HDL Ratio   Hemoglobin A1c   Other Visit Diagnoses       Abnormal glucose       Relevant Orders   Lipid Panel With LDL/HDL Ratio   Hemoglobin A1c     Dislocation of temporomandibular joint, initial encounter         Cervical pain       Relevant Orders   DG Cervical Spine Complete     Need for pneumococcal vaccination       Relevant Orders   Pneumococcal conjugate vaccine 20-valent (Prevnar 20) (Completed)     Dysfunction of right eustachian tube          Eustachian tube dysfunction-eardrum itself looks great canals clear no irritation or erythema.  Continue with nasal steroid spray as her probably is still just a little bit of pressure buildup causing the clicking.  Cervical pain-will get plain film today with her recent cancer history she is always a little extra concerned about the potential for metastasis.  I suspect that it is probably some osteoarthritis that is inflamed  and causing maybe some occipital nerve impingement especially since her TMJ joints are really inflamed right now as well.  TMJ-avoid excess chewing such as meat, gum etc. avoid overextending the jaw with yawning etc.  Given a handout for some stretches to do on her own at home.  No follow-ups on file.    Dorothyann Byars, MD

## 2023-11-06 NOTE — Assessment & Plan Note (Signed)
 Due for updated lipid levels.  She is not interested in taking any type of statin but we are wanting to still monitor and know where things are at.

## 2023-11-07 ENCOUNTER — Other Ambulatory Visit: Payer: Self-pay

## 2023-11-07 ENCOUNTER — Ambulatory Visit: Payer: Self-pay | Admitting: Family Medicine

## 2023-11-07 DIAGNOSIS — R519 Headache, unspecified: Secondary | ICD-10-CM

## 2023-11-07 DIAGNOSIS — M542 Cervicalgia: Secondary | ICD-10-CM

## 2023-11-07 LAB — LIPID PANEL WITH LDL/HDL RATIO
Cholesterol, Total: 160 mg/dL (ref 100–199)
HDL: 71 mg/dL (ref >39)
LDL Chol Calc (NIH): 66 mg/dL (ref 0–99)
LDL/HDL Ratio: 0.9 ratio (ref 0.0–3.2)
Triglycerides: 132 mg/dL (ref 0–149)
VLDL Cholesterol Cal: 23 mg/dL (ref 5–40)

## 2023-11-07 LAB — HEMOGLOBIN A1C
Est. average glucose Bld gHb Est-mCnc: 100 mg/dL
Hgb A1c MFr Bld: 5.1 % (ref 4.8–5.6)

## 2023-11-07 NOTE — Progress Notes (Signed)
 Your lab work is within acceptable range and there are no concerning findings.   ?

## 2023-11-08 ENCOUNTER — Other Ambulatory Visit: Payer: Self-pay

## 2023-11-08 ENCOUNTER — Other Ambulatory Visit (HOSPITAL_COMMUNITY): Payer: Self-pay

## 2023-11-09 ENCOUNTER — Encounter: Payer: Self-pay | Admitting: Radiation Oncology

## 2023-11-12 NOTE — Progress Notes (Signed)
 Radiation Oncology         [phone] ________________________________  Name: Maria Romero MRN: [id number]  Date: 11/13/2023  DOB: 12/11/1950  Follow-Up Visit Note  CC: Alvan Dorothyann BIRCH, MD  Alvan Dorothyann BIRCH, *  No diagnosis found.  Diagnosis:  Stage IIIC  ([id number]) serous adenocarcinoma of the right ovary - HRD (+) diagnosed in 2023, s/p neoadjuvant chemotherapy, followed by tumor debulking and adjuvant chemotherapy  --  Disease recurrence in March of 2025 characterized by a right hemidiaphragm implant/lesion (from a right ovarian cancer primary)   Interval Since Last Radiation: 28 days   Intent: Curative  Radiation Treatment Dates: First Treatment Date: 2023-10-02 -- Last Treatment Date: 2023-10-16 Site/Dose/Technique/Mode:  Plan Name: Abd_UHRT Site: Abdomen Technique: IMRT Mode: Photon Dose Per Fraction: 5 Gy Prescribed Dose (Delivered / Prescribed): 50 Gy / 50 Gy Prescribed Fxs (Delivered / Prescribed): 10 / 10  Narrative:  The patient returns today for routine follow-up. She tolerated radiation therapy relatively well other than fatigue and some mild vertigo which were present throughout her treatment course.   Since her initial consultation date of 06/09 (and prior to starting radiation therapy), she followed up with Dr. Timmy on 09/21/23. During which time, she was noted to be doing well overall and tolerating Lynparza  well.   She also followed up with Dr. Viktoria on 06/26 for a surveillance visit. Incidentally, she developed nausea with emesis and dizziness just an hour before the visit. Her symptoms were favored to be attributed to radiation therapy, poor P.O intake, and climate related factors (hot weather). She was otherwise noted as NED on examination, and a new Rx for vaginal estrogen was sent to her pharmacy.   Pertinent imaging performed in the interval since she began radiation therapy includes a bilateral screening mammogram on 10/08/23 which  showed no evidence of malignancy in either breast.   ***                        Allergies:  is allergic to fluvastatin sodium and sudafed [pseudoephedrine hcl].  Meds: Current Outpatient Medications  Medication Sig Dispense Refill   amLODipine  (NORVASC ) 2.5 MG tablet Take 1 tablet (2.5 mg total) by mouth daily. 90 tablet 3   amoxicillin  (AMOXIL ) 500 MG tablet Take 2,000 mg by mouth once. Take 1 hour prior to dental procedure.     atorvastatin  (LIPITOR) 40 MG tablet TAKE ONE TABLET BY MOUTH EVERY NIGHT AT BEDTIME 90 tablet 3   Biotin 89999 MCG TABS Take 10,000 mcg by mouth daily.     Calcium -Vitamin D -Vitamin K (VIACTIV PO) Take 1 tablet by mouth in the morning and at bedtime.     cholecalciferol (VITAMIN D3) 25 MCG (1000 UNIT) tablet Take 1,000 Units by mouth daily.     conjugated estrogens  (PREMARIN ) vaginal cream Place 1 Applicatorful vaginally 3 (three) times a week. 42.5 g 3   lidocaine -prilocaine  (EMLA ) cream Apply a dime size of cream to the port-a-cath 1-2 hours prior to access. Cover with [employer]. 30 g 3   magnesium  oxide (MAG-OX) 400 (240 Mg) MG tablet Take 1 tablet (400 mg total) by mouth 2 (two) times daily. 60 tablet 5   meclizine  (ANTIVERT ) 25 MG tablet Take 1 tablet (25 mg total) by mouth 3 (three) times daily as needed for dizziness. 30 tablet 0   meloxicam  (MOBIC ) 15 MG tablet One tab PO every 24 hours with a meal for 2 weeks, then once every 24 hours prn  pain. 30 tablet 3   olaparib  (LYNPARZA ) 150 MG tablet Take 2 tablets (300 mg total) by mouth 2 (two) times daily. Swallow whole. May take with food to decrease nausea and vomiting. Take as instructed per MD 120 tablet 3   omeprazole  (PRILOSEC ) 20 MG capsule TAKE 1 CAPSULE BY MOUTH DAILY 90 capsule 2   ondansetron  (ZOFRAN ) 8 MG tablet Take 1 tablet (8 mg total) by mouth every 8 (eight) hours as needed for nausea or vomiting. 20 tablet 0   senna (SENOKOT) 8.6 MG tablet Take 1 tablet by mouth daily.     TYLENOL  500 MG tablet  Take 500-1,000 mg by mouth every 6 (six) hours as needed for mild pain or headache.     Current Facility-Administered Medications  Medication Dose Route Frequency Provider Last Rate Last Admin   zoledronic  acid (RECLAST ) injection 5 mg  5 mg Intravenous Once         Physical Findings: The patient is in no acute distress. Patient is alert and oriented.  vitals were not taken for this visit. .  No significant changes. Lungs are clear to auscultation bilaterally. Heart has regular rate and rhythm. No palpable cervical, supraclavicular, or axillary adenopathy. Abdomen soft, non-tender, normal bowel sounds.   Lab Findings: Lab Results  Component Value Date   WBC 4.7 10/05/2023   HGB 11.3 (L) 10/05/2023   HCT 31.2 (L) 10/05/2023   MCV 112.6 (H) 10/05/2023   PLT 181 10/05/2023    Radiographic Findings: DG Cervical Spine Complete Result Date: 11/11/2023 CLINICAL DATA:  xray of cervical [place], pain and HA EXAM: CERVICAL SPINE - COMPLETE 4+ VIEW COMPARISON:  None Available. FINDINGS: Osteopenia. The cervical spine is visualized from C1-C7. Cervical alignment is maintained. Vertebral body heights are maintained: no evidence of acute fracture. Severe intervertebral disc space height loss of C4-5 and moderate to severe intervertebral disc space height loss at C5-6. Moderate intervertebral disc space height loss is C6-7. Multilevel facet arthropathy and uncovertebral hypertrophy. Mild to moderate osseous neuroforaminal narrowing of LEFT C3-4, bilateral C4-5 and LEFT C6-7. No prevertebral soft tissue swelling. RIGHT chest port. IMPRESSION: Multilevel degenerative changes of the cervical spine, most pronounced at C4-5. Given history of malignancy, if there is a persistent clinical concern for metastatic disease, this would be better assessed with dedicated cross-sectional imaging. Electronically Signed   By: Corean Salter M.D.   On: 11/11/2023 10:55    Impression: Stage IIIC  ([id number]) serous  adenocarcinoma of the right ovary - HRD (+) diagnosed in 2023, s/p neoadjuvant chemotherapy, followed by tumor debulking and adjuvant chemotherapy  --  Disease recurrence in March of 2025 characterized by a right hemidiaphragm implant/lesion (from a right ovarian cancer primary)   The patient is recovering from the effects of radiation.  ***  Plan:  ***   *** minutes of total time was spent for this patient encounter, including preparation, face-to-face counseling with the patient and coordination of care, physical exam, and documentation of the encounter. ____________________________________  Lynwood CHARM Nasuti, PhD, MD  This document serves as a record of services personally performed by [name], MD. It was created on his behalf by Dorthy Fuse, a trained medical scribe. The creation of this record is based on the scribe's personal observations and the provider's statements to them. This document has been checked and approved by the attending provider.

## 2023-11-13 ENCOUNTER — Encounter: Payer: Self-pay | Admitting: Radiation Oncology

## 2023-11-13 ENCOUNTER — Ambulatory Visit
Admission: RE | Admit: 2023-11-13 | Discharge: 2023-11-13 | Disposition: A | Discharge status: Home / Self Care | Source: Ambulatory Visit | Attending: Radiation Oncology | Admitting: Radiation Oncology

## 2023-11-13 VITALS — BP 133/68 | HR 73 | Temp 97.3°F | Resp 18 | Ht 63.0 in | Wt 110.0 lb

## 2023-11-13 DIAGNOSIS — Z923 Personal history of irradiation: Secondary | ICD-10-CM | POA: Insufficient documentation

## 2023-11-13 DIAGNOSIS — C561 Malignant neoplasm of right ovary: Secondary | ICD-10-CM | POA: Insufficient documentation

## 2023-11-13 DIAGNOSIS — C786 Secondary malignant neoplasm of retroperitoneum and peritoneum: Secondary | ICD-10-CM | POA: Diagnosis not present

## 2023-11-13 DIAGNOSIS — Z79899 Other long term (current) drug therapy: Secondary | ICD-10-CM | POA: Insufficient documentation

## 2023-11-13 DIAGNOSIS — M8580 Other specified disorders of bone density and structure, unspecified site: Secondary | ICD-10-CM | POA: Diagnosis not present

## 2023-11-13 DIAGNOSIS — Z9221 Personal history of antineoplastic chemotherapy: Secondary | ICD-10-CM | POA: Insufficient documentation

## 2023-11-13 HISTORY — DX: Personal history of irradiation: Z92.3

## 2023-11-13 NOTE — Progress Notes (Signed)
 Maria Romero is here today for follow up post radiation to the pelvic.  They completed their radiation on: 10/723  Does the patient complain of any of the following:  Pain: Patient denies pain in pelvic area, but co plaining of neck pain. Abdominal bloating: Patient denies Diarrhea/Constipation: +Constipation Nausea/Vomiting: Denies Vaginal Discharge: Patient denies Blood in Urine or Stool: Patient denies  Urinary Issues (dysuria/incomplete emptying/ incontinence/ increased frequency/urgency): Patient denies Does patient report using vaginal dilator 2-3 times a week and/or sexually active 2-3 weeks:* Post radiation skin changes: Patient denies   Additional comments if applicable: Patient wants to get second opinion on cervical spine report.  BP 133/68 (BP Location: Left Arm, Patient Position: Sitting, Cuff Size: Normal)   Pulse 73   Temp (!) 97.3 F (36.3 C)   Resp 18   Ht 5' 3 (1.6 m)   Wt 110 lb (49.9 kg)   SpO2 100%   BMI 19.49 kg/m

## 2023-11-15 NOTE — Progress Notes (Signed)
 Hi Maria Romero, the x-ray did show some degenerative changes in the spine at several levels but it was the worst at C4-C5.  So the middle part of the neck.  The disc between that area is really thin meaning that it is probably herniated or injured.  Is also some moderate to severe disc space loss at C5-C6.  And more moderate at C6-C7.  You have a little arthritis at the hinge points of the neck as well.  And a little narrowing where the nerve root comes out on the left side at C3-4 and on both sides at C4-C5 and on the left side at C6-C7.  How would you feel about maybe doing some formal physical therapy for your neck.  I do think it could make a difference and I think it could also help reduce your headaches.  If that something you would be okay with and let me know we have a great PT here but if there is something closer to home or you have a preference let me know.

## 2023-11-16 ENCOUNTER — Other Ambulatory Visit: Payer: Self-pay

## 2023-11-16 NOTE — Progress Notes (Signed)
 Specialty Pharmacy Ongoing Clinical Assessment Note  Maria Romero is a 73 y.o. female who is being followed by the specialty pharmacy service for RxSp Oncology   Patient's specialty medication(s) reviewed today: Olaparib  (LYNPARZA )   Missed doses in the last 4 weeks: 0   Patient/Caregiver did not have any additional questions or concerns.   Therapeutic benefit summary: Unable to assess   Adverse events/side effects summary: No adverse events/side effects   Patient's therapy is appropriate to: Continue    Goals Addressed             This Visit's Progress    Slow Disease Progression   No change    Patient is not on track and no change. Patient will maintain adherence, be monitored by provider to determine if a change in treatment plan is warranted, and be evaluated at upcoming provider appointment to assess progress. Per visit on 6/13, patient to have repeat scans now that she has completed radiosurgery and will be discussed at appointment on 8/14.        Follow up: 3 months  [facility]

## 2023-11-20 NOTE — Therapy (Signed)
 OUTPATIENT PHYSICAL THERAPY CERVICAL EVALUATION   Patient Name: Maria Romero MRN: [id number] DOB:07-Sep-1950, 73 y.o., female Today's Date: 11/21/2023  END OF SESSION:  PT End of Session - 11/21/23 1314     Visit Number 1    Number of Visits 17    Date for PT Re-Evaluation 01/16/24    Authorization Type [employer] MDC    Authorization Time Period auth tbd    Progress Note Due on Visit 10    PT Start Time 1315    PT Stop Time 1404    PT Time Calculation (min) 49 min    Activity Tolerance Patient tolerated treatment well          Past Medical History:  Diagnosis Date   Anemia    Complication of anesthesia    Dyspnea    GERD (gastroesophageal reflux disease)    Goals of care, counseling/discussion 12/01/2021   History of radiation therapy    Abdomen-10/02/23-10/16/23- [name]   Hypercholesterolemia    Hypertension    IBS (irritable bowel syndrome)    Ileostomy in place [facility])    Malignant ascites 11/19/2021   Neuromuscular disorder (HCC)    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   Osteopenia    Osteoporosis    [name], right (HCC) 12/01/2021   Ovarian mass, right 11/19/2021   PONV (postoperative nausea and vomiting)    Toe fracture    lt small toe   Past Surgical History:  Procedure Laterality Date   BOWEL RESECTION N/A 04/20/2022   Procedure: BOWEL RESECTION; DIVERTING ILEOSTOMY;  Surgeon: Viktoria Comer SAUNDERS, MD;  Location: [facility];  Service: Gynecology;  Laterality: N/A;   DEBULKING N/A 04/20/2022   Procedure: TUMOR DEBULKING, OMENTECTOMY;  Surgeon: Viktoria Comer SAUNDERS, MD;  Location: [facility];  Service: Gynecology;  Laterality: N/A;   HYSTERECTOMY ABDOMINAL WITH SALPINGO-OOPHORECTOMY Bilateral 04/20/2022   Procedure: HYSTERECTOMY ABDOMINAL WITH BILATERAL SALPINGO-OOPHORECTOMY;  Surgeon: Viktoria Comer SAUNDERS, MD;  Location: [facility];  Service: Gynecology;  Laterality: Bilateral;   ILEOSTOMY CLOSURE N/A 09/07/2022   Procedure: ILEOSTOMY TAKEDOWN;  Surgeon: Viktoria Comer SAUNDERS, MD;   Location: [facility];  Service: General;  Laterality: N/A;   IR IMAGING GUIDED PORT INSERTION  11/26/2021   IR PARACENTESIS  11/23/2021   IR PARACENTESIS  11/26/2021   LAPAROSCOPY N/A 04/20/2022   Procedure: LAPAROSCOPY DIAGNOSTIC,CYSTO;  Surgeon: Viktoria Comer SAUNDERS, MD;  Location: [facility];  Service: Gynecology;  Laterality: N/A;   lump removal  04/11/1993   axilla right - benign   TONSILLECTOMY     TUBAL LIGATION  04/11/1988   Patient Active Problem List   Diagnosis Date Noted   GERD (gastroesophageal reflux disease) 04/13/2023   Trigger thumb, left thumb 02/24/2023   Impingement syndrome, shoulder, left 02/24/2023   Hypomagnesemia 09/10/2022   Ileostomy in place [facility]) 09/07/2022   Ileostomy present (HCC) 09/07/2022   Lactic acidosis 05/04/2022   Hypocalcemia 04/23/2022   Ovarian cancer (HCC) 04/20/2022   Genetic testing 01/24/2022   Neutropenia (HCC) 12/15/2021   Hyponatremia 12/15/2021   Hyperkalemia 12/15/2021   AKI (acute kidney injury) (HCC) 12/15/2021   Neutropenia, drug-induced (HCC) 12/14/2021   [name], right (HCC) 12/01/2021   Goals of care, counseling/discussion 12/01/2021   Malignant ascites 11/19/2021   Abdominal pain 11/08/2021   Osteoporosis 05/05/2021   Raynaud disease 02/09/2015   Bradycardia 08/25/2009   PALPITATIONS 08/25/2009   Abnormal levels of other serum enzymes 11/13/2007   Disorder of bone and cartilage 05/22/2007   HYPERCHOLESTEROLEMIA 05/21/2007   IBS  05/21/2007   LEG PAIN, LEFT 05/21/2007    PCP: Alvan Dorothyann BIRCH, MD  REFERRING PROVIDER: Alvan Dorothyann BIRCH, MD  REFERRING DIAG: M54.2 (ICD-10-CM) - Cervical pain R51.9 (ICD-10-CM) - Frequent headaches  THERAPY DIAG:  Cervicalgia  Abnormal posture  Rationale for Evaluation and Treatment: Rehabilitation  ONSET DATE: beginning of 2025  SUBJECTIVE:                                                                                                                                                                                                          SUBJECTIVE STATEMENT: Reports posterior neck pain and headaches since beginning of year. States she had a lot of reactions during her time with chemo, required hospitalization and rehab stay. Developed BIL TMJ, felt neck pain and headaches came afterwards. Has had some shoulder issues for which she has seen Dr. Curtis. States her headache is typically provoked by looking up from supine position (cervical flexion, watching TV in bed) Did receive therapy for balance and walking after her hospitalizations.  Just finished up radiation. Sees oncology this week.    PERTINENT HISTORY:  GERD, hx radiation therapy, HTN, IBS, ileostomy, neuropathy (chemo), osteoporosis, ovarian cancer, palpitations Port on RUE   PAIN:  Are you having pain: none at rest, 6/10 with movement Location/description: middle of neck, sometimes radiated to top of head when lying down Best-worst over past week: 0-9/10  - aggravating factors: rotation, extension, lying down, looking down - Easing factors: changing positions, pillow    PRECAUTIONS: cancer, port RUE, osteoporosis  RED FLAGS: None  No N/T in UE, no recent visual changes (does endorse needing cataract surgery), no speech/swallowing issues. No balance issues.     WEIGHT BEARING RESTRICTIONS: No  FALLS:  Has patient fallen in last 6 months? 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  DIAGNOSTIC FINDINGS:  11/06/23 cervical XR: IMPRESSION: Multilevel degenerative changes of the cervical spine, most pronounced at C4-5.   Given history of  malignancy, if there is a persistent clinical concern for metastatic disease, this would be better assessed with dedicated cross-sectional imaging.  PATIENT SURVEYS:  NDI: 8/50; 16%  COGNITION: Overall cognitive status: Within functional limits for tasks assessed  SENSATION: Denies sensory  issues   POSTURE: fwd head, rounded shoulders, BIL UT elevation R>LR  PALPATION: TTP to BIL UT, LS, rhomboids, nonconcordant per pt report; no significant tenderness to paracervicals or suboccipitals, does have significant SCM/scalene tightness BIL   CERVICAL ROM:   ROM A/PROM (deg) eval  Flexion 75%   Extension 50%   Right lateral flexion   Left lateral flexion   Right rotation 68 deg  Left rotation 52 deg   (Blank rows = not tested) (Key: WFL = within functional limits not formally assessed, * = concordant pain, s = stiffness/stretching sensation, NT = not tested) Comment:  UPPER EXTREMITY ROM:  A/PROM Right eval Left eval  Shoulder flexion    Shoulder abduction    Shoulder internal rotation    Shoulder external rotation    Elbow flexion    Elbow extension    Wrist flexion    Wrist extension     (Blank rows = not tested) (Key: WFL = within functional limits not formally assessed, * = concordant pain, s = stiffness/stretching sensation, NT = not tested)  Comments: flexion symmetrical and painless BIL, L abduction limited d/t chronic shoulder issues per pt report  UPPER EXTREMITY MMT:  MMT Right eval Left eval  Shoulder flexion    Shoulder extension 5 5  Shoulder abduction 5 5  Shoulder extension    Shoulder internal rotation    Shoulder external rotation    Elbow flexion    Elbow extension    Grip strength    (Blank rows = not tested)  (Key: WFL = within functional limits not formally assessed, * = concordant pain, s = stiffness/stretching sensation, NT = not tested)  Comments:   CERVICAL SPECIAL TESTS:  DNF endurance test 20sec w/ fatigue    TREATMENT DATE:   [facility] Adult PT Treatment:                                                DATE: 11/21/23 Therapeutic Exercise: Scapular retraction, chin tuck practice reps + HEP education/handout                                                                                                                           PATIENT EDUCATION:  Education details: Pt education on PT impairments, prognosis, and POC. Informed consent. Rationale for interventions, safe/appropriate HEP performance Person educated: Patient Education method: Explanation, Demonstration, Tactile cues, Verbal cues Education comprehension: verbalized understanding, returned demonstration, verbal cues required, tactile cues required, and needs further education    HOME EXERCISE PROGRAM: Access Code: [email or web address] URL: [email or web address] Date: 11/21/2023 Prepared by: Alm Jenny  Exercises - Seated Scapular Retraction  - 2-3 x daily - 1 sets - 10 reps - Supine Cervical Retraction with Towel  - 2-3 x daily - 1 sets - 10 reps  ASSESSMENT:  CLINICAL IMPRESSION: Patient is a  pleasant 73 y.o. woman who was seen today for physical therapy evaluation and treatment for neck pain + headaches ongoing since beginning of year. Tends to occur with general neck movement and with prolonged positioning in more flexed position. On exam she demonstrates concordant discomfort with cervical mobility although does not provoke headache, does endorse fatigue with DNF flexion test. Non-concordant TTP throughout postural musculature although suboccipitals/paracervicals are nontender. Does have significant scalene/SCM tightness to palpation. No adverse events, tolerates exam/HEP well overall. Recommend trial of skilled PT to address aforementioned deficits with aim of improving functional tolerance and reducing pain with typical activities. Pt departs today's session in no acute distress, all voiced concerns/questions addressed appropriately from PT  perspective.      OBJECTIVE IMPAIRMENTS: decreased activity tolerance, decreased endurance, decreased ROM, impaired perceived functional ability, postural dysfunction, and pain.   ACTIVITY LIMITATIONS: sitting and sleeping  PARTICIPATION LIMITATIONS: driving and community activity  PERSONAL FACTORS: Age, Time since onset of injury/illness/exacerbation, and 3+ comorbidities: GERD, hx radiation therapy, HTN, IBS, ileostomy, neuropathy (chemo), osteoporosis, ovarian cancer, palpitations are also affecting patient's functional outcome.   REHAB POTENTIAL: Good  CLINICAL DECISION MAKING: Evolving/moderate complexity  EVALUATION COMPLEXITY: Moderate   GOALS:   SHORT TERM GOALS: Target date: 12/19/2023  Pt will demonstrate appropriate understanding and performance of initially prescribed HEP in order to facilitate improved independence with management of symptoms.  Baseline: HEP established  Goal status: INITIAL   2. Pt will report at least 25% improvement in overall pain levels over past week in order to facilitate improved tolerance to typical daily activities.   Baseline: 0-9/10  Goal status: INITIAL    LONG TERM GOALS: Target date: 01/16/2024  Pt will score less than or equal to 5% on NDI in order to demonstrate improved perception of function due to symptoms (MDC 10-13 pts per Neysa dunker al 2009, 2010). Baseline: 16% Goal status: INITIAL  2. Pt will demonstrate at least 60 degrees of active cervical rotation ROM BIL in order to demonstrate improved environmental awareness and safety with driving.  Baseline: see ROM chart above Goal status: INITIAL  3. Pt will demonstrate at least 50% improvement in supine DNF endurance test in order to facilitate improved activity tolerance. Baseline: 20sec with nonpainful fatigue Goal status: INITIAL   4. Pt will report at least 50% reduction in frequency/intensity of headaches in order to improve tolerance to usual tasks. Baseline: 0% Goal  status: INITIAL   5. Pt will demonstrate appropriate performance of final prescribed HEP in order to facilitate improved self-management of symptoms post-discharge.   Baseline: initial HEP prescribed  Goal status: INITIAL     PLAN:  PT FREQUENCY: 2x/week  PT DURATION: 8 weeks  PLANNED INTERVENTIONS: 97164- PT Re-evaluation, 97750- Physical Performance Testing, 97110-Therapeutic exercises, 97530- Therapeutic activity, [id number]- Neuromuscular re-education, 97535- Self Care, 02859- Manual therapy, 20560 (1-2 muscles), 20561 (3+ muscles)- Dry Needling, Patient/Family education, Taping, Joint mobilization, Spinal mobilization, Cryotherapy, and Moist heat  PLAN FOR NEXT SESSION: Review/update HEP PRN. Would likely benefit from soft tissue work to periscapular musculature, consider incorporating SCM/scalenes as well. Work on [employer] endurance, periscapular/cervical stability. Symptom modification strategies as indicated/appropriate. Mindful of active cancer w/ recent radiation therapy, history of osteoporosis, RUE port.    Alm DELENA Jenny PT, DPT 11/21/2023 4:06 PM

## 2023-11-21 ENCOUNTER — Ambulatory Visit: Attending: Family Medicine | Admitting: Physical Therapy

## 2023-11-21 ENCOUNTER — Encounter: Payer: Self-pay | Admitting: Physical Therapy

## 2023-11-21 ENCOUNTER — Other Ambulatory Visit: Payer: Self-pay

## 2023-11-21 DIAGNOSIS — M542 Cervicalgia: Secondary | ICD-10-CM | POA: Insufficient documentation

## 2023-11-21 DIAGNOSIS — R293 Abnormal posture: Secondary | ICD-10-CM | POA: Insufficient documentation

## 2023-11-21 DIAGNOSIS — R519 Headache, unspecified: Secondary | ICD-10-CM | POA: Diagnosis not present

## 2023-11-23 ENCOUNTER — Inpatient Hospital Stay

## 2023-11-23 ENCOUNTER — Inpatient Hospital Stay: Attending: Hematology & Oncology

## 2023-11-23 ENCOUNTER — Other Ambulatory Visit: Payer: Self-pay | Admitting: *Deleted

## 2023-11-23 ENCOUNTER — Other Ambulatory Visit: Payer: Self-pay | Admitting: Family Medicine

## 2023-11-23 ENCOUNTER — Inpatient Hospital Stay: Admitting: Hematology & Oncology

## 2023-11-23 VITALS — BP 115/58 | HR 63 | Temp 98.1°F | Resp 18 | Ht 63.0 in | Wt 112.6 lb

## 2023-11-23 DIAGNOSIS — R748 Abnormal levels of other serum enzymes: Secondary | ICD-10-CM

## 2023-11-23 DIAGNOSIS — C561 Malignant neoplasm of right ovary: Secondary | ICD-10-CM

## 2023-11-23 DIAGNOSIS — D702 Other drug-induced agranulocytosis: Secondary | ICD-10-CM

## 2023-11-23 DIAGNOSIS — Z90722 Acquired absence of ovaries, bilateral: Secondary | ICD-10-CM | POA: Insufficient documentation

## 2023-11-23 DIAGNOSIS — T451X5A Adverse effect of antineoplastic and immunosuppressive drugs, initial encounter: Secondary | ICD-10-CM

## 2023-11-23 DIAGNOSIS — Z95828 Presence of other vascular implants and grafts: Secondary | ICD-10-CM

## 2023-11-23 DIAGNOSIS — R180 Malignant ascites: Secondary | ICD-10-CM

## 2023-11-23 DIAGNOSIS — Z90710 Acquired absence of both cervix and uterus: Secondary | ICD-10-CM | POA: Diagnosis not present

## 2023-11-23 DIAGNOSIS — M810 Age-related osteoporosis without current pathological fracture: Secondary | ICD-10-CM

## 2023-11-23 DIAGNOSIS — C569 Malignant neoplasm of unspecified ovary: Secondary | ICD-10-CM

## 2023-11-23 DIAGNOSIS — I10 Essential (primary) hypertension: Secondary | ICD-10-CM

## 2023-11-23 DIAGNOSIS — Z9221 Personal history of antineoplastic chemotherapy: Secondary | ICD-10-CM | POA: Insufficient documentation

## 2023-11-23 DIAGNOSIS — N6311 Unspecified lump in the right breast, upper outer quadrant: Secondary | ICD-10-CM

## 2023-11-23 DIAGNOSIS — Z923 Personal history of irradiation: Secondary | ICD-10-CM | POA: Diagnosis not present

## 2023-11-23 LAB — CBC WITH DIFFERENTIAL (CANCER CENTER ONLY)
Abs Immature Granulocytes: 0.01 K/uL (ref 0.00–0.07)
Basophils Absolute: 0 K/uL (ref 0.0–0.1)
Basophils Relative: 1 %
Eosinophils Absolute: 0 K/uL (ref 0.0–0.5)
Eosinophils Relative: 1 %
HCT: 25.8 % — ABNORMAL LOW (ref 36.0–46.0)
Hemoglobin: 9.2 g/dL — ABNORMAL LOW (ref 12.0–15.0)
Immature Granulocytes: 0 %
Lymphocytes Relative: 18 %
Lymphs Abs: 0.7 K/uL (ref 0.7–4.0)
MCH: 43.2 pg — ABNORMAL HIGH (ref 26.0–34.0)
MCHC: 35.7 g/dL (ref 30.0–36.0)
MCV: 121.1 fL — ABNORMAL HIGH (ref 80.0–100.0)
Monocytes Absolute: 0.3 K/uL (ref 0.1–1.0)
Monocytes Relative: 9 %
Neutro Abs: 2.6 K/uL (ref 1.7–7.7)
Neutrophils Relative %: 71 %
Platelet Count: 162 K/uL (ref 150–400)
RBC: 2.13 MIL/uL — ABNORMAL LOW (ref 3.87–5.11)
RDW: 15.7 % — ABNORMAL HIGH (ref 11.5–15.5)
WBC Count: 3.6 K/uL — ABNORMAL LOW (ref 4.0–10.5)
nRBC: 0 % (ref 0.0–0.2)

## 2023-11-23 LAB — CMP (CANCER CENTER ONLY)
ALT: 22 U/L (ref 0–44)
AST: 29 U/L (ref 15–41)
Albumin: 4.3 g/dL (ref 3.5–5.0)
Alkaline Phosphatase: 47 U/L (ref 38–126)
Anion gap: 11 (ref 5–15)
BUN: 18 mg/dL (ref 8–23)
CO2: 25 mmol/L (ref 22–32)
Calcium: 9.4 mg/dL (ref 8.9–10.3)
Chloride: 105 mmol/L (ref 98–111)
Creatinine: 1.04 mg/dL — ABNORMAL HIGH (ref 0.44–1.00)
GFR, Estimated: 56 mL/min — ABNORMAL LOW (ref >60)
Glucose, Bld: 104 mg/dL — ABNORMAL HIGH (ref 70–99)
Potassium: 3.9 mmol/L (ref 3.5–5.1)
Sodium: 141 mmol/L (ref 135–145)
Total Bilirubin: 0.6 mg/dL (ref 0.0–1.2)
Total Protein: 6.3 g/dL — ABNORMAL LOW (ref 6.5–8.1)

## 2023-11-23 LAB — RETICULOCYTES
Immature Retic Fract: 22.3 % — ABNORMAL HIGH (ref 2.3–15.9)
RBC.: 2.11 MIL/uL — ABNORMAL LOW (ref 3.87–5.11)
Retic Count, Absolute: 57.2 K/uL (ref 19.0–186.0)
Retic Ct Pct: 2.7 % (ref 0.4–3.1)

## 2023-11-23 LAB — VITAMIN B12: Vitamin B-12: 205 pg/mL (ref 180–914)

## 2023-11-23 LAB — IRON AND IRON BINDING CAPACITY (CC-WL,HP ONLY)
Iron: 122 ug/dL (ref 28–170)
Saturation Ratios: 43 % — ABNORMAL HIGH (ref 10.4–31.8)
TIBC: 286 ug/dL (ref 250–450)
UIBC: 164 ug/dL

## 2023-11-23 LAB — MAGNESIUM: Magnesium: 1.8 mg/dL (ref 1.7–2.4)

## 2023-11-23 LAB — FERRITIN: Ferritin: 220 ng/mL (ref 11–307)

## 2023-11-23 NOTE — Patient Instructions (Signed)

## 2023-11-23 NOTE — Progress Notes (Signed)
 Given his minimal Hematology and Oncology Follow Up Visit  Maria Romero [id number] 02/07/1951 73 y.o. 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 Year --next dose 06/2024 SBRT --completed on 10/16/2023     Interim History:  Maria Romero is back for follow-up.  She is doing quite well.  She had radiosurgery for this isolated recurrence in the abdomen.  She had 10 treatments.  She completed this on October 16, 2023.  She feels well.  She had no problems with the radiation therapy.  She said there was 1 point in which she did have some vomiting.  She may have had little bit of vertigo and tinnitus.  She has had no diarrhea.  She has had no rashes.  She has had no leg swelling.  There has been no obvious bleeding.  Her appetite is doing quite well.  She has had no cough or shortness of breath.  Her last CA-125 which was done on 09/21/2023, was 20.6.  Currently, I would have to say that her performance status is ECOG 1.     Wt Readings from Last 3 Encounters:  11/23/23 112 lb 9.6 oz (51.1 kg)  11/13/23 110 lb (49.9 kg)  11/06/23 109 lb 1 oz (49.5 kg)    Medications:  Current Outpatient Medications:    amLODipine  (NORVASC ) 2.5 MG tablet, Take 1 tablet (2.5 mg total) by mouth daily., Disp: 90 tablet, Rfl: 3   amoxicillin  (AMOXIL ) 500 MG tablet, Take 2,000 mg by mouth once. Take 1 hour prior to dental procedure., Disp: , Rfl:    atorvastatin  (LIPITOR) 40 MG tablet, TAKE ONE TABLET BY MOUTH EVERY NIGHT AT BEDTIME, Disp: 90 tablet, Rfl: 3   Biotin 10000 MCG TABS, Take 10,000 mcg  by mouth daily., Disp: , Rfl:    Calcium -Vitamin D -Vitamin K (VIACTIV PO), Take 1 tablet by mouth in the morning and at bedtime., Disp: , Rfl:    cholecalciferol (VITAMIN D3) 25 MCG (1000 UNIT) tablet, Take 1,000 Units by mouth daily., Disp: , Rfl:    conjugated estrogens  (PREMARIN ) vaginal cream, Place 1 Applicatorful vaginally 3 (three) times a week., Disp: 42.5 g, Rfl: 3   lidocaine -prilocaine  (EMLA ) cream, Apply a dime size of cream to the port-a-cath 1-2 hours prior to access. Cover with [employer]., Disp: 30 g, Rfl: 3   magnesium  oxide (MAG-OX) 400 (240 Mg) MG tablet, Take 1 tablet (400 mg total) by mouth 2 (two) times daily., Disp: 60 tablet, Rfl: 5   meclizine  (ANTIVERT ) 25 MG tablet, Take 1 tablet (25 mg total) by mouth 3 (three) times daily as needed for dizziness., Disp: 30 tablet, Rfl: 0   meloxicam  (MOBIC ) 15 MG tablet, One tab PO every 24 hours with a meal for 2 weeks, then once every 24 hours prn pain., Disp: 30 tablet, Rfl: 3   olaparib  (LYNPARZA ) 150 MG tablet, Take 2 tablets (300 mg total) by mouth 2 (two) times daily. Swallow whole. May take with food to decrease nausea and vomiting. Take as instructed per MD, Disp: 120 tablet, Rfl: 3   omeprazole  (PRILOSEC ) 20 MG capsule, TAKE  1 CAPSULE BY MOUTH DAILY, Disp: 90 capsule, Rfl: 2   ondansetron  (ZOFRAN ) 8 MG tablet, Take 1 tablet (8 mg total) by mouth every 8 (eight) hours as needed for nausea or vomiting., Disp: 20 tablet, Rfl: 0   senna (SENOKOT) 8.6 MG tablet, Take 1 tablet by mouth daily., Disp: , Rfl:    TYLENOL  500 MG tablet, Take 500-1,000 mg by mouth every 6 (six) hours as needed for mild pain or headache., Disp: , Rfl:   Current Facility-Administered Medications:    zoledronic  acid (RECLAST ) injection 5 mg, 5 mg, Intravenous, Once,   Allergies:  Allergies  Allergen Reactions   Fluvastatin Sodium Other (See Comments)    Myalgias   Sudafed [Pseudoephedrine Hcl] Other (See Comments)    Makes her feel high    Past  Medical History, Surgical history, Social history, and Family History were reviewed and updated.  Review of Systems: Review of Systems  Constitutional: Negative.   HENT:  Negative.    Eyes: Negative.   Respiratory: Negative.    Cardiovascular: Negative.   Gastrointestinal:  Negative for abdominal pain.  Genitourinary:  Negative for pelvic pain.   Musculoskeletal: Negative.   Skin: Negative.   Neurological: Negative.   Hematological: Negative.   Psychiatric/Behavioral: Negative.      Physical Exam:  height is 5' 3 (1.6 m) and weight is 112 lb 9.6 oz (51.1 kg). Her oral temperature is 98.1 F (36.7 C). Her blood pressure is 115/58 (abnormal) and her pulse is 63. Her respiration is 18 and oxygen saturation is 100%.   Wt Readings from Last 3 Encounters:  11/23/23 112 lb 9.6 oz (51.1 kg)  11/13/23 110 lb (49.9 kg)  11/06/23 109 lb 1 oz (49.5 kg)    Physical Exam Vitals reviewed.  HENT:     Head: Normocephalic and atraumatic.  Eyes:     Pupils: Pupils are equal, round, and reactive to light.  Cardiovascular:     Rate and Rhythm: Normal rate and regular rhythm.     Heart sounds: Normal heart sounds.  Pulmonary:     Effort: Pulmonary effort is normal.     Breath sounds: Normal breath sounds.  Chest:    Abdominal:     General: Bowel sounds are normal.     Palpations: Abdomen is soft.     Comments: Abdominal exam is soft.  She has good bowel sounds.  There is no fluid wave.  She has a well-healed laparotomy scar.  She has a scar where she had the colostomy.  There is no palpable abdominal mass.  There is no palpable liver or spleen tip.    Musculoskeletal:        General: No tenderness or deformity. Normal range of motion.     Cervical back: Normal range of motion.  Lymphadenopathy:     Cervical: No cervical adenopathy.  Skin:    General: Skin is warm and dry.     Findings: No erythema or rash.  Neurological:     Mental Status: She is alert and oriented to person,  place, and time.  Psychiatric:        Behavior: Behavior normal.        Thought Content: Thought content normal.        Judgment: Judgment normal.      Lab Results  Component Value Date   WBC 3.6 (L) 11/23/2023   HGB 9.2 (L) 11/23/2023   HCT 25.8 (L) 11/23/2023   MCV 121.1 (H) 11/23/2023   PLT  162 11/23/2023     Chemistry      Component Value Date/Time   NA 141 11/23/2023 1005   K 3.9 11/23/2023 1005   CL 105 11/23/2023 1005   CO2 25 11/23/2023 1005   BUN 18 11/23/2023 1005   CREATININE 1.04 (H) 11/23/2023 1005   CREATININE 0.93 11/03/2021 0000      Component Value Date/Time   CALCIUM  9.4 11/23/2023 1005   ALKPHOS 47 11/23/2023 1005   AST 29 11/23/2023 1005   ALT 22 11/23/2023 1005   BILITOT 0.6 11/23/2023 1005        Impression and Plan: Ms. Bunten is a very charming 73 year old white female.  She had neoadjuvant chemotherapy.  She really had a tough time with neoadjuvant chemotherapy.  She subsequently underwent surgical resection.  She did still have active disease.    We then gave her some additional chemotherapy.  By her CA-125, she was then remission.  I think that her colostomy reversal certainly confirms that she was in remission.  I will set her up with a CT scan the end of September.  By then, that would be a good 2-3 months after she had her radiosurgery.  I will then plan to see her back about a week or so afterwards.  Hopefully, we will see that she has responded very nicely.  I am glad that her quality life is doing well.  I have noted the that her hemoglobin is down a little bit.  Her MCV is up quite a bit.  I would like to check some anemia studies on her.    Maude JONELLE Crease, MD 8/14/202511:27 AM

## 2023-11-24 ENCOUNTER — Ambulatory Visit: Payer: Self-pay | Admitting: Hematology & Oncology

## 2023-11-24 LAB — CA 125: Cancer Antigen (CA) 125: 17.7 U/mL (ref 0.0–38.1)

## 2023-11-24 NOTE — Therapy (Signed)
 OUTPATIENT PHYSICAL THERAPY TREATMENT   Patient Name: ALAURA SCHIPPERS MRN: [id number] DOB:Nov 02, 1950, 73 y.o., female Today's Date: 11/27/2023  END OF SESSION:  PT End of Session - 11/27/23 0932     Visit Number 2    Number of Visits 17    Date for PT Re-Evaluation 01/16/24    Authorization Type [employer] MDC    Authorization Time Period auth submitted    Progress Note Due on Visit 10    PT Start Time 0931    PT Stop Time 1013    PT Time Calculation (min) 42 min    Activity Tolerance Patient tolerated treatment well           Past Medical History:  Diagnosis Date   Anemia    Complication of anesthesia    Dyspnea    GERD (gastroesophageal reflux disease)    Goals of care, counseling/discussion 12/01/2021   History of radiation therapy    Abdomen-10/02/23-10/16/23- [name]   Hypercholesterolemia    Hypertension    IBS (irritable bowel syndrome)    Ileostomy in place [facility])    Malignant ascites 11/19/2021   Neuromuscular disorder (HCC)    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   Osteopenia    Osteoporosis    [name], right (HCC) 12/01/2021   Ovarian mass, right 11/19/2021   PONV (postoperative nausea and vomiting)    Toe fracture    lt small toe   Past Surgical History:  Procedure Laterality Date   BOWEL RESECTION N/A 04/20/2022   Procedure: BOWEL RESECTION; DIVERTING ILEOSTOMY;  Surgeon: Viktoria Comer SAUNDERS, MD;  Location: [facility];  Service: Gynecology;  Laterality: N/A;   DEBULKING N/A 04/20/2022   Procedure: TUMOR DEBULKING, OMENTECTOMY;  Surgeon: Viktoria Comer SAUNDERS, MD;  Location: [facility];  Service: Gynecology;  Laterality: N/A;   HYSTERECTOMY ABDOMINAL WITH SALPINGO-OOPHORECTOMY Bilateral 04/20/2022   Procedure: HYSTERECTOMY ABDOMINAL WITH BILATERAL SALPINGO-OOPHORECTOMY;  Surgeon: Viktoria Comer SAUNDERS, MD;  Location: [facility];  Service: Gynecology;  Laterality: Bilateral;   ILEOSTOMY CLOSURE N/A 09/07/2022   Procedure: ILEOSTOMY TAKEDOWN;  Surgeon: Viktoria Comer SAUNDERS, MD;   Location: [facility];  Service: General;  Laterality: N/A;   IR IMAGING GUIDED PORT INSERTION  11/26/2021   IR PARACENTESIS  11/23/2021   IR PARACENTESIS  11/26/2021   LAPAROSCOPY N/A 04/20/2022   Procedure: LAPAROSCOPY DIAGNOSTIC,CYSTO;  Surgeon: Viktoria Comer SAUNDERS, MD;  Location: [facility];  Service: Gynecology;  Laterality: N/A;   lump removal  04/11/1993   axilla right - benign   TONSILLECTOMY     TUBAL LIGATION  04/11/1988   Patient Active Problem List   Diagnosis Date Noted   GERD (gastroesophageal reflux disease) 04/13/2023   Trigger thumb, left thumb 02/24/2023   Impingement syndrome, shoulder, left 02/24/2023   Hypomagnesemia 09/10/2022   Ileostomy in place [facility]) 09/07/2022   Ileostomy present (HCC) 09/07/2022   Lactic acidosis 05/04/2022   Hypocalcemia 04/23/2022   Ovarian cancer (HCC) 04/20/2022   Genetic testing 01/24/2022   Neutropenia (HCC) 12/15/2021   Hyponatremia 12/15/2021   Hyperkalemia 12/15/2021   AKI (acute kidney injury) (HCC) 12/15/2021   Neutropenia, drug-induced (HCC) 12/14/2021   [name], right (HCC) 12/01/2021   Goals of care, counseling/discussion 12/01/2021   Malignant ascites 11/19/2021   Abdominal pain 11/08/2021   Osteoporosis 05/05/2021   Raynaud disease 02/09/2015   Bradycardia 08/25/2009   PALPITATIONS 08/25/2009   Abnormal levels of other serum enzymes 11/13/2007   Disorder of bone and cartilage 05/22/2007   HYPERCHOLESTEROLEMIA 05/21/2007   IBS  05/21/2007   LEG PAIN, LEFT 05/21/2007    PCP: Alvan Dorothyann BIRCH, MD  REFERRING PROVIDER: Alvan Dorothyann BIRCH, MD  REFERRING DIAG: M54.2 (ICD-10-CM) - Cervical pain R51.9 (ICD-10-CM) - Frequent headaches  THERAPY DIAG:  Cervicalgia  Abnormal posture  Rationale for Evaluation and Treatment: Rehabilitation  ONSET DATE: beginning of 2025  SUBJECTIVE:                                                                                                                                                                                                         Per eval: Reports posterior neck pain and headaches since beginning of year. States she had a lot of reactions during her time with chemo, required hospitalization and rehab stay. Developed BIL TMJ, felt neck pain and headaches came afterwards. Has had some shoulder issues for which she has seen Dr. Curtis. States her headache is typically provoked by looking up from supine position (cervical flexion, watching TV in bed) Did receive therapy for balance and walking after her hospitalizations.  Just finished up radiation. Sees oncology this week.   SUBJECTIVE STATEMENT: 11/27/2023: states she did HEP a little bit without much change, was busy with family activities. States she has been following with oncology and discussing her lab work. Otherwise no new updates.    PERTINENT HISTORY:  GERD, hx radiation therapy, HTN, IBS, ileostomy, neuropathy (chemo), osteoporosis, ovarian cancer, palpitations Port on RUE   PAIN:  Are you having pain: none at rest, 5/10 with movement  Per eval:  none at rest, 6/10 with movement Location/description: middle of neck, sometimes radiated to top of head when lying down Best-worst over past week: 0-9/10  - aggravating factors: rotation, extension, lying down, looking down - Easing factors: changing positions, pillow    PRECAUTIONS: cancer, port RUE, osteoporosis  RED FLAGS: None  No N/T in UE, no recent visual changes (does endorse needing cataract surgery), no speech/swallowing issues. No balance issues.     WEIGHT BEARING RESTRICTIONS: No  FALLS:  Has patient fallen in last 6 months? 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  DIAGNOSTIC FINDINGS:  11/06/23 cervical XR: IMPRESSION: Multilevel degenerative changes of the cervical spine, most pronounced at C4-5.  Given history of malignancy, if there is a persistent clinical concern for metastatic disease, this would be better assessed with dedicated cross-sectional imaging.  PATIENT SURVEYS:  NDI: 8/50; 16%  COGNITION: Overall cognitive status: Within functional limits for tasks assessed  SENSATION: Denies sensory issues   POSTURE: fwd head, rounded shoulders, BIL UT elevation R>LR  PALPATION: TTP to BIL UT, LS, rhomboids, nonconcordant per pt report; no significant tenderness to paracervicals or suboccipitals, does have significant SCM/scalene tightness BIL   CERVICAL ROM:   ROM A/PROM (deg) eval  Flexion 75%   Extension 50%   Right lateral flexion   Left lateral flexion   Right rotation 68 deg  Left rotation 52 deg   (Blank rows = not tested) (Key: WFL = within functional limits not formally assessed, * = concordant pain, s = stiffness/stretching sensation, NT = not tested) Comment:  UPPER EXTREMITY ROM:  A/PROM Right eval Left eval  Shoulder flexion    Shoulder abduction    Shoulder internal rotation    Shoulder external rotation    Elbow flexion    Elbow extension    Wrist flexion    Wrist extension     (Blank rows = not tested) (Key: WFL = within functional limits not formally assessed, * = concordant pain, s = stiffness/stretching sensation, NT = not tested)  Comments: flexion symmetrical and painless BIL, L abduction limited d/t chronic shoulder issues per pt report  UPPER EXTREMITY MMT:  MMT Right eval Left eval  Shoulder flexion    Shoulder extension 5 5  Shoulder abduction 5 5  Shoulder extension    Shoulder internal rotation    Shoulder external rotation    Elbow flexion    Elbow extension    Grip strength     (Blank rows = not tested)  (Key: WFL = within functional limits not formally assessed, * = concordant pain, s = stiffness/stretching sensation, NT = not tested)  Comments:   CERVICAL SPECIAL TESTS:  DNF endurance test 20sec w/ fatigue    TREATMENT DATE:  [facility] Adult PT Treatment:                                                DATE: 11/27/23 Therapeutic Exercise: Supine cervical rotation x8 BIL cues for comfortable ROM and breath control Supine chin tuck 2 pillows 2x10 cues for forms Supine shoulder flexion AROM emphasis on thoracic extension x12  HEP update + education  Manual Therapy: Supine and seated; gentle STM L>R UT, LS, paracervicals; gentle trigger point release L UT and LS Improved cervical rotation towards L post MT   [facility] Adult PT Treatment:                                                DATE: 11/21/23 Therapeutic Exercise: Scapular retraction, chin tuck practice reps + HEP education/handout  PATIENT EDUCATION:  Education details: rationale for interventions, HEP  Person educated: Patient Education method: Explanation, Demonstration, Tactile cues, Verbal cues Education comprehension: verbalized understanding, returned demonstration, verbal cues required, tactile cues required, and needs further education     HOME EXERCISE PROGRAM: Access Code: [email or web address] URL: [email or web address] Date: 11/27/2023 Prepared by: Alm Jenny  Exercises - Seated Scapular Retraction  - 2-3 x daily - 1 sets - 10 reps - Supine Cervical Retraction with Towel  - 2-3 x daily - 1 sets - 10 reps - Supine Shoulder Flexion Extension Full Range AROM  - 2-3 x daily - 1 sets - 8-10 reps - Supine Cervical Rotation AROM on Pillow  - 2-3 x daily - 1 sets - 8-10 reps  ASSESSMENT:  CLINICAL IMPRESSION: 11/27/2023: pt arrives w/ no resting pain but 5/10 pain when  turning head to left. Responds well to manual as above, particularly with gentle trigger point release L UT/LS.  Noted subsequent improvement in rotation ROM. After treatment she is able to perform her concordant movement (supine cervicothoracic flexion) without pain/headache. No adverse events, tolerates session well overall, HEP update as above. Recommend continuing along current POC in order to address relevant deficits and improve functional tolerance. Pt departs today's session in no acute distress, all voiced questions/concerns addressed appropriately from PT perspective.    Per eval: Patient is a pleasant 73 y.o. woman who was seen today for physical therapy evaluation and treatment for neck pain + headaches ongoing since beginning of year. Tends to occur with general neck movement and with prolonged positioning in more flexed position. On exam she demonstrates concordant discomfort with cervical mobility although does not provoke headache, does endorse fatigue with DNF flexion test. Non-concordant TTP throughout postural musculature although suboccipitals/paracervicals are nontender. Does have significant scalene/SCM tightness to palpation. No adverse events, tolerates exam/HEP well overall. Recommend trial of skilled PT to address aforementioned deficits with aim of improving functional tolerance and reducing pain with typical activities. Pt departs today's session in no acute distress, all voiced concerns/questions addressed appropriately from PT perspective.      OBJECTIVE IMPAIRMENTS: decreased activity tolerance, decreased endurance, decreased ROM, impaired perceived functional ability, postural dysfunction, and pain.   ACTIVITY LIMITATIONS: sitting and sleeping  PARTICIPATION LIMITATIONS: driving and community activity  PERSONAL FACTORS: Age, Time since onset of injury/illness/exacerbation, and 3+ comorbidities: GERD, hx radiation therapy, HTN, IBS, ileostomy, neuropathy (chemo),  osteoporosis, ovarian cancer, palpitations are also affecting patient's functional outcome.   REHAB POTENTIAL: Good  CLINICAL DECISION MAKING: Evolving/moderate complexity  EVALUATION COMPLEXITY: Moderate   GOALS:   SHORT TERM GOALS: Target date: 12/19/2023  Pt will demonstrate appropriate understanding and performance of initially prescribed HEP in order to facilitate improved independence with management of symptoms.  Baseline: HEP established  Goal status: INITIAL   2. Pt will report at least 25% improvement in overall pain levels over past week in order to facilitate improved tolerance to typical daily activities.   Baseline: 0-9/10  Goal status: INITIAL    LONG TERM GOALS: Target date: 01/16/2024  Pt will score less than or equal to 5% on NDI in order to demonstrate improved perception of function due to symptoms (MDC 10-13 pts per Neysa dunker al 2009, 2010). Baseline: 16% Goal status: INITIAL  2. Pt will demonstrate at least 60 degrees of active cervical rotation ROM BIL in order to demonstrate improved environmental awareness and safety with driving.  Baseline: see ROM chart above Goal status: INITIAL  3. Pt will demonstrate at least  50% improvement in supine DNF endurance test in order to facilitate improved activity tolerance. Baseline: 20sec with nonpainful fatigue Goal status: INITIAL   4. Pt will report at least 50% reduction in frequency/intensity of headaches in order to improve tolerance to usual tasks. Baseline: 0% Goal status: INITIAL   5. Pt will demonstrate appropriate performance of final prescribed HEP in order to facilitate improved self-management of symptoms post-discharge.   Baseline: initial HEP prescribed  Goal status: INITIAL     PLAN:  PT FREQUENCY: 2x/week  PT DURATION: 8 weeks  PLANNED INTERVENTIONS: 97164- PT Re-evaluation, 97750- Physical Performance Testing, 97110-Therapeutic exercises, 97530- Therapeutic activity, [id number]- Neuromuscular  re-education, 97535- Self Care, [id number]- Manual therapy, 20560 (1-2 muscles), 20561 (3+ muscles)- Dry Needling, Patient/Family education, Taping, Joint mobilization, Spinal mobilization, Cryotherapy, and Moist heat  PLAN FOR NEXT SESSION: Review/update HEP PRN. Would likely benefit from soft tissue work to periscapular musculature, consider incorporating SCM/scalenes as well. Work on [employer] endurance, periscapular/cervical stability. Symptom modification strategies as indicated/appropriate. Mindful of active cancer w/ recent radiation therapy, history of osteoporosis, RUE port.    Alm DELENA Jenny PT, DPT 11/27/2023 10:16 AM

## 2023-11-27 ENCOUNTER — Encounter: Payer: Self-pay | Admitting: Physical Therapy

## 2023-11-27 ENCOUNTER — Encounter (INDEPENDENT_AMBULATORY_CARE_PROVIDER_SITE_OTHER): Payer: Self-pay

## 2023-11-27 ENCOUNTER — Other Ambulatory Visit: Payer: Self-pay

## 2023-11-27 ENCOUNTER — Ambulatory Visit: Admitting: Physical Therapy

## 2023-11-27 DIAGNOSIS — M542 Cervicalgia: Secondary | ICD-10-CM | POA: Diagnosis not present

## 2023-11-27 DIAGNOSIS — R293 Abnormal posture: Secondary | ICD-10-CM | POA: Diagnosis not present

## 2023-11-27 DIAGNOSIS — R519 Headache, unspecified: Secondary | ICD-10-CM | POA: Diagnosis not present

## 2023-11-28 ENCOUNTER — Other Ambulatory Visit: Payer: Self-pay

## 2023-11-30 ENCOUNTER — Ambulatory Visit

## 2023-11-30 DIAGNOSIS — R293 Abnormal posture: Secondary | ICD-10-CM

## 2023-11-30 DIAGNOSIS — R519 Headache, unspecified: Secondary | ICD-10-CM | POA: Diagnosis not present

## 2023-11-30 DIAGNOSIS — M542 Cervicalgia: Secondary | ICD-10-CM | POA: Diagnosis not present

## 2023-11-30 NOTE — Therapy (Signed)
 OUTPATIENT PHYSICAL THERAPY TREATMENT   Patient Name: Maria Romero MRN: [id number] DOB:1950/08/11, 73 y.o., female Today's Date: 11/30/2023  END OF SESSION:  PT End of Session - 11/30/23 1151     Visit Number 3    Number of Visits 17    Date for PT Re-Evaluation 01/16/24    Authorization Type [employer] MDC    Authorization Time Period 16 visits approved for PT 11/21/2023-01/16/2024    Authorization - Visit Number 3    Authorization - Number of Visits 16    Progress Note Due on Visit 10    PT Start Time 1150    PT Stop Time 1238    PT Time Calculation (min) 48 min    Activity Tolerance Patient tolerated treatment well    Behavior During Therapy [facility] for tasks assessed/performed         Past Medical History:  Diagnosis Date   Anemia    Complication of anesthesia    Dyspnea    GERD (gastroesophageal reflux disease)    Goals of care, counseling/discussion 12/01/2021   History of radiation therapy    Abdomen-10/02/23-10/16/23- [name]   Hypercholesterolemia    Hypertension    IBS (irritable bowel syndrome)    Ileostomy in place [facility])    Malignant ascites 11/19/2021   Neuromuscular disorder (HCC)    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   Osteopenia    Osteoporosis    [name], right (HCC) 12/01/2021   Ovarian mass, right 11/19/2021   PONV (postoperative nausea and vomiting)    Toe fracture    lt small toe   Past Surgical History:  Procedure Laterality Date   BOWEL RESECTION N/A 04/20/2022   Procedure: BOWEL RESECTION; DIVERTING ILEOSTOMY;  Surgeon: Viktoria Comer SAUNDERS, MD;  Location: [facility];  Service: Gynecology;  Laterality: N/A;   DEBULKING N/A 04/20/2022   Procedure: TUMOR DEBULKING, OMENTECTOMY;  Surgeon: Viktoria Comer SAUNDERS, MD;  Location: [facility];  Service: Gynecology;  Laterality: N/A;   HYSTERECTOMY ABDOMINAL WITH SALPINGO-OOPHORECTOMY Bilateral 04/20/2022   Procedure: HYSTERECTOMY ABDOMINAL WITH BILATERAL SALPINGO-OOPHORECTOMY;  Surgeon: Viktoria Comer SAUNDERS, MD;   Location: [facility];  Service: Gynecology;  Laterality: Bilateral;   ILEOSTOMY CLOSURE N/A 09/07/2022   Procedure: ILEOSTOMY TAKEDOWN;  Surgeon: Viktoria Comer SAUNDERS, MD;  Location: [facility];  Service: General;  Laterality: N/A;   IR IMAGING GUIDED PORT INSERTION  11/26/2021   IR PARACENTESIS  11/23/2021   IR PARACENTESIS  11/26/2021   LAPAROSCOPY N/A 04/20/2022   Procedure: LAPAROSCOPY DIAGNOSTIC,CYSTO;  Surgeon: Viktoria Comer SAUNDERS, MD;  Location: [facility];  Service: Gynecology;  Laterality: N/A;   lump removal  04/11/1993   axilla right - benign   TONSILLECTOMY     TUBAL LIGATION  04/11/1988   Patient Active Problem List   Diagnosis Date Noted   GERD (gastroesophageal reflux disease) 04/13/2023   Trigger thumb, left thumb 02/24/2023   Impingement syndrome, shoulder, left 02/24/2023   Hypomagnesemia 09/10/2022   Ileostomy in place [facility]) 09/07/2022   Ileostomy present (HCC) 09/07/2022   Lactic acidosis 05/04/2022   Hypocalcemia 04/23/2022   Ovarian cancer (HCC) 04/20/2022   Genetic testing 01/24/2022   Neutropenia (HCC) 12/15/2021   Hyponatremia 12/15/2021   Hyperkalemia 12/15/2021   AKI (acute kidney injury) (HCC) 12/15/2021   Neutropenia, drug-induced (HCC) 12/14/2021   [name], right (HCC) 12/01/2021   Goals of care, counseling/discussion 12/01/2021   Malignant ascites 11/19/2021   Abdominal pain 11/08/2021   Osteoporosis 05/05/2021   Raynaud disease 02/09/2015   Bradycardia  08/25/2009   PALPITATIONS 08/25/2009   Abnormal levels of other serum enzymes 11/13/2007   Disorder of bone and cartilage 05/22/2007   HYPERCHOLESTEROLEMIA 05/21/2007   IBS 05/21/2007   LEG PAIN, LEFT 05/21/2007    PCP: Alvan Dorothyann BIRCH, MD  REFERRING PROVIDER: Alvan Dorothyann BIRCH, MD  REFERRING DIAG: M54.2 (ICD-10-CM) - Cervical pain R51.9 (ICD-10-CM) - Frequent headaches  THERAPY DIAG:  Cervicalgia  Abnormal posture  Rationale for Evaluation and Treatment: Rehabilitation  ONSET DATE:  beginning of 2025  SUBJECTIVE:                                                                                                                                                                                                        Per eval: Reports posterior neck pain and headaches since beginning of year. States she had a lot of reactions during her time with chemo, required hospitalization and rehab stay. Developed BIL TMJ, felt neck pain and headaches came afterwards. Has had some shoulder issues for which she has seen Dr. Curtis. States her headache is typically provoked by looking up from supine position (cervical flexion, watching TV in bed) Did receive therapy for balance and walking after her hospitalizations.  Just finished up radiation. Sees oncology this week.   SUBJECTIVE STATEMENT: Patient reports she has restriction turning head to the Lt; no change in pain since eval.    PERTINENT HISTORY:  GERD, hx radiation therapy, HTN, IBS, ileostomy, neuropathy (chemo), osteoporosis, ovarian cancer, palpitations Port on RUE   PAIN:  Are you having pain: none at rest, 5/10 with movement  Per eval:  none at rest, 6/10 with movement Location/description: middle of neck, sometimes radiated to top of head when lying down Best-worst over past week: 0-9/10  - aggravating factors: rotation, extension, lying down, looking down - Easing factors: changing positions, pillow    PRECAUTIONS: cancer, port RUE, osteoporosis  RED FLAGS: None  No N/T in UE, no recent visual changes (does endorse needing cataract surgery), no speech/swallowing issues. No balance issues.     WEIGHT BEARING RESTRICTIONS: No  FALLS:  Has patient fallen in last 6 months? 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  DIAGNOSTIC FINDINGS:  11/06/23 cervical XR: IMPRESSION:  Multilevel degenerative changes of the cervical spine, most pronounced at C4-5.   Given history of malignancy, if there is a persistent clinical concern for metastatic disease, this would be better assessed with dedicated cross-sectional imaging.  PATIENT SURVEYS:  NDI: 8/50; 16%  COGNITION: Overall cognitive status: Within functional limits for tasks assessed  SENSATION: Denies sensory issues   POSTURE: fwd head, rounded shoulders, BIL UT elevation R>LR  PALPATION: TTP to BIL UT, LS, rhomboids, nonconcordant per pt report; no significant tenderness to paracervicals or suboccipitals, does have significant SCM/scalene tightness BIL   CERVICAL ROM:   ROM A/PROM (deg) eval  Flexion 75%   Extension 50%   Right lateral flexion   Left lateral flexion   Right rotation 68 deg  Left rotation 52 deg   (Blank rows = not tested) (Key: WFL = within functional limits not formally assessed, * = concordant pain, s = stiffness/stretching sensation, NT = not tested) Comment:  UPPER EXTREMITY ROM:  A/PROM Right eval Left eval  Shoulder flexion    Shoulder abduction    Shoulder internal rotation    Shoulder external rotation    Elbow flexion    Elbow extension    Wrist flexion    Wrist extension     (Blank rows = not tested) (Key: WFL = within functional limits not formally assessed, * = concordant pain, s = stiffness/stretching sensation, NT = not tested)  Comments: flexion symmetrical and painless BIL, L abduction limited d/t chronic shoulder issues per pt report  UPPER EXTREMITY MMT:  MMT Right eval Left eval  Shoulder flexion    Shoulder extension 5 5  Shoulder abduction 5 5  Shoulder extension    Shoulder internal rotation    Shoulder external rotation    Elbow  flexion    Elbow extension    Grip strength    (Blank rows = not tested)  (Key: WFL = within functional limits not formally assessed, * = concordant pain, s = stiffness/stretching sensation, NT = not tested)  Comments:   CERVICAL SPECIAL TESTS:  DNF endurance test 20sec w/ fatigue      [facility] Adult PT Treatment:                                                DATE: 11/30/2023 Therapeutic Exercise: Supine cervical AROM all directions + deflated ball underneath head Seated:  Platysma pin & stretch UT & LS  stretches  Supine shoulder flexion --> long towel roll along spine & horizontal across scaps Manual Therapy: STM cervical paraspinals, UT/LS, temporalis TPR pterygoids Neuromuscular re-ed: Supine DNF head lift 4x20 Cervical extension isometric + tactile assist from therapist hands Standing scapula retraction with tactile cues from noodle   TREATMENT DATE:  [facility] Adult PT Treatment:                                                DATE: 11/27/23 Therapeutic Exercise: Supine cervical rotation x8 BIL cues for comfortable ROM and breath control Supine chin tuck 2 pillows 2x10 cues for forms Supine shoulder flexion AROM emphasis on thoracic extension x12  HEP update + education  Manual Therapy: Supine and seated; gentle STM L>R UT, LS, paracervicals; gentle trigger point release L UT  and LS Improved cervical rotation towards L post MT   [facility] Adult PT Treatment:                                                DATE: 11/21/23 Therapeutic Exercise: Scapular retraction, chin tuck practice reps + HEP education/handout                                                                                                                           PATIENT EDUCATION:  Education details: Updated HEP  Person educated: Patient Education method: Explanation, Demonstration, Tactile cues, Verbal cues Education comprehension: verbalized understanding, returned demonstration, verbal cues required,  tactile cues required, and needs further education     HOME EXERCISE PROGRAM: Access Code: [email or web address] URL: [email or web address] Date: 11/30/2023 Prepared by: Lamarr Price  Exercises - Seated Scapular Retraction  - 2-3 x daily - 1 sets - 10 reps - Supine Cervical Retraction with Towel  - 2-3 x daily - 1 sets - 10 reps - Supine Shoulder Flexion Extension Full Range AROM  - 2-3 x daily - 1 sets - 8-10 reps - Supine Cervical Rotation AROM on Pillow  - 2-3 x daily - 1 sets - 8-10 reps - Seated Cervical Sidebending Stretch  - 3-5 x daily - 1 sets - 1-3 reps - 30 sec hold - Seated Levator Scapulae Stretch  - 3-5 x daily - 1 sets - 1-3 reps - 30 sec hold  ASSESSMENT:  CLINICAL IMPRESSION: Cervical stretches added today, as well as to HEP, focusing on decreasing significant myofascial tension present on bilateral upper trapezius musculature. Mild fatigue demonstrated with deep neck flexor exercise. Tactile cues improved scapula retraction mechanics and decreased upper trap compensation. Patient will continue to benefit from skilled therapy to address pain and mobility deficits.  Per eval: Patient is a pleasant 73 y.o. woman who was seen today for physical therapy evaluation and treatment for neck pain + headaches ongoing since beginning of year. Tends to occur with general neck movement and with prolonged positioning in more flexed position. On exam she demonstrates concordant discomfort with cervical mobility although does not provoke headache, does endorse fatigue with DNF flexion test. Non-concordant TTP throughout postural musculature although suboccipitals/paracervicals are nontender. Does have significant scalene/SCM tightness to palpation. No adverse events, tolerates exam/HEP well overall. Recommend trial of skilled PT to address aforementioned deficits with aim of improving functional tolerance and reducing pain with typical activities. Pt departs today's session in no acute  distress, all voiced concerns/questions addressed appropriately from PT perspective.      OBJECTIVE IMPAIRMENTS: decreased activity tolerance, decreased endurance, decreased ROM, impaired perceived functional ability, postural dysfunction, and pain.   ACTIVITY LIMITATIONS: sitting and sleeping  PARTICIPATION LIMITATIONS: driving and community activity  PERSONAL FACTORS: Age, Time since onset of injury/illness/exacerbation, and 3+ comorbidities: GERD, hx radiation therapy,  HTN, IBS, ileostomy, neuropathy (chemo), osteoporosis, ovarian cancer, palpitations are also affecting patient's functional outcome.   REHAB POTENTIAL: Good  CLINICAL DECISION MAKING: Evolving/moderate complexity  EVALUATION COMPLEXITY: Moderate   GOALS:   SHORT TERM GOALS: Target date: 12/19/2023  Pt will demonstrate appropriate understanding and performance of initially prescribed HEP in order to facilitate improved independence with management of symptoms.  Baseline: HEP established  Goal status: INITIAL   2. Pt will report at least 25% improvement in overall pain levels over past week in order to facilitate improved tolerance to typical daily activities.   Baseline: 0-9/10  Goal status: INITIAL    LONG TERM GOALS: Target date: 01/16/2024  Pt will score less than or equal to 5% on NDI in order to demonstrate improved perception of function due to symptoms (MDC 10-13 pts per Neysa dunker al 2009, 2010). Baseline: 16% Goal status: INITIAL  2. Pt will demonstrate at least 60 degrees of active cervical rotation ROM BIL in order to demonstrate improved environmental awareness and safety with driving.  Baseline: see ROM chart above Goal status: INITIAL  3. Pt will demonstrate at least 50% improvement in supine DNF endurance test in order to facilitate improved activity tolerance. Baseline: 20sec with nonpainful fatigue Goal status: INITIAL   4. Pt will report at least 50% reduction in frequency/intensity of  headaches in order to improve tolerance to usual tasks. Baseline: 0% Goal status: INITIAL   5. Pt will demonstrate appropriate performance of final prescribed HEP in order to facilitate improved self-management of symptoms post-discharge.   Baseline: initial HEP prescribed  Goal status: INITIAL     PLAN:  PT FREQUENCY: 2x/week  PT DURATION: 8 weeks  PLANNED INTERVENTIONS: 97164- PT Re-evaluation, 97750- Physical Performance Testing, 97110-Therapeutic exercises, 97530- Therapeutic activity, [id number]- Neuromuscular re-education, 97535- Self Care, [id number]- Manual therapy, 20560 (1-2 muscles), 20561 (3+ muscles)- Dry Needling, Patient/Family education, Taping, Joint mobilization, Spinal mobilization, Cryotherapy, and Moist heat  PLAN FOR NEXT SESSION: Review/update HEP PRN. Would likely benefit from soft tissue work to periscapular musculature, consider incorporating SCM/scalenes as well. Work on [employer] endurance, periscapular/cervical stability. Symptom modification strategies as indicated/appropriate. Mindful of active cancer w/ recent radiation therapy, history of osteoporosis, RUE port.    Lamarr Price, PTA 11/30/2023 12:39 PM

## 2023-12-01 NOTE — Therapy (Signed)
 OUTPATIENT PHYSICAL THERAPY TREATMENT   Patient Name: Maria Romero MRN: [id number] DOB:[birth year], 73 y.o., female Today's Date: 12/04/2023  END OF SESSION:  PT End of Session - 12/04/23 0932     Visit Number 4    Number of Visits 17    Date for PT Re-Evaluation 01/16/24    Authorization Type [employer] MDC    Authorization Time Period 16 visits approved for PT 11/21/2023-01/16/2024    Authorization - Visit Number 4    Authorization - Number of Visits 16    Progress Note Due on Visit 10    PT Start Time 0932    PT Stop Time 1014    PT Time Calculation (min) 42 min    Activity Tolerance Patient tolerated treatment well          Past Medical History:  Diagnosis Date   Anemia    Complication of anesthesia    Dyspnea    GERD (gastroesophageal reflux disease)    Goals of care, counseling/discussion 12/01/2021   History of radiation therapy    Abdomen-10/02/23-10/16/23- Maria Romero   Hypercholesterolemia    Hypertension    IBS (irritable bowel syndrome)    Ileostomy in place [facility])    Malignant ascites 11/19/2021   Neuromuscular disorder (HCC)    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   Osteopenia    Osteoporosis    [name], right (HCC) 12/01/2021   Ovarian mass, right 11/19/2021   PONV (postoperative nausea and vomiting)    Toe fracture    lt small toe   Past Surgical History:  Procedure Laterality Date   BOWEL RESECTION N/A 04/20/2022   Procedure: BOWEL RESECTION; DIVERTING ILEOSTOMY;  Surgeon: Maria Comer SAUNDERS, MD;  Location: [facility];  Service: Gynecology;  Laterality: N/A;   DEBULKING N/A 04/20/2022   Procedure: TUMOR DEBULKING, OMENTECTOMY;  Surgeon: Maria Comer SAUNDERS, MD;  Location: [facility];  Service: Gynecology;  Laterality: N/A;   HYSTERECTOMY ABDOMINAL WITH SALPINGO-OOPHORECTOMY Bilateral 04/20/2022   Procedure: HYSTERECTOMY ABDOMINAL WITH BILATERAL SALPINGO-OOPHORECTOMY;  Surgeon: Maria Comer SAUNDERS, MD;  Location: [facility];  Service: Gynecology;  Laterality: Bilateral;    ILEOSTOMY CLOSURE N/A 09/07/2022   Procedure: ILEOSTOMY TAKEDOWN;  Surgeon: Maria Comer SAUNDERS, MD;  Location: [facility];  Service: General;  Laterality: N/A;   IR IMAGING GUIDED PORT INSERTION  11/26/2021   IR PARACENTESIS  11/23/2021   IR PARACENTESIS  11/26/2021   LAPAROSCOPY N/A 04/20/2022   Procedure: LAPAROSCOPY DIAGNOSTIC,CYSTO;  Surgeon: Maria Comer SAUNDERS, MD;  Location: [facility];  Service: Gynecology;  Laterality: N/A;   lump removal  04/11/1993   axilla right - benign   TONSILLECTOMY     TUBAL LIGATION  04/11/1988   Patient Active Problem List   Diagnosis Date Noted   GERD (gastroesophageal reflux disease) 04/13/2023   Trigger thumb, left thumb 02/24/2023   Impingement syndrome, shoulder, left 02/24/2023   Hypomagnesemia 09/10/2022   Ileostomy in place [facility]) 09/07/2022   Ileostomy present (HCC) 09/07/2022   Lactic acidosis 05/04/2022   Hypocalcemia 04/23/2022   Ovarian cancer (HCC) 04/20/2022   Genetic testing 01/24/2022   Neutropenia (HCC) 12/15/2021   Hyponatremia 12/15/2021   Hyperkalemia 12/15/2021   AKI (acute kidney injury) (HCC) 12/15/2021   Neutropenia, drug-induced (HCC) 12/14/2021   [name], right (HCC) 12/01/2021   Goals of care, counseling/discussion 12/01/2021   Malignant ascites 11/19/2021   Abdominal pain 11/08/2021   Osteoporosis 05/05/2021   Raynaud disease 02/09/2015   Bradycardia 08/25/2009   PALPITATIONS 08/25/2009   Abnormal levels  of other serum enzymes 11/13/2007   Disorder of bone and cartilage 05/22/2007   HYPERCHOLESTEROLEMIA 05/21/2007   IBS 05/21/2007   LEG PAIN, LEFT 05/21/2007    PCP: Maria Dorothyann BIRCH, MD  REFERRING PROVIDER: Alvan Dorothyann BIRCH, MD  REFERRING DIAG: M54.2 (ICD-10-CM) - Cervical pain R51.9 (ICD-10-CM) - Frequent headaches  THERAPY DIAG:  Cervicalgia  Abnormal posture  Rationale for Evaluation and Treatment: Rehabilitation  ONSET DATE: beginning of 2025  SUBJECTIVE:                                                                                                                                                                                                         Per eval: Reports posterior neck pain and headaches since beginning of year. States she had a lot of reactions during her time with chemo, required hospitalization and rehab stay. Developed BIL TMJ, felt neck pain and headaches came afterwards. Has had some shoulder issues for which she has seen Dr. Curtis. States her headache is typically provoked by looking up from supine position (cervical flexion, watching TV in bed) Did receive therapy for balance and walking after her hospitalizations.  Just finished up radiation. Sees oncology this week.   SUBJECTIVE STATEMENT: 12/04/2023: pt states her neck seems to be hurting a bit more when she turns it, has been trying to stretch more. Notes she has head some headaches but weren't very intense, more on L sided where before it was more bilateral. Overall feels things are about the same.     PERTINENT HISTORY:  GERD, hx radiation therapy, HTN, IBS, ileostomy, neuropathy (chemo), osteoporosis, ovarian cancer, palpitations Port on RUE   PAIN:  Are you having pain: none at rest, 5/10 with movement   Per eval:  none at rest, 6/10 with movement Location/description: middle of neck, sometimes radiated to top of head when lying down Best-worst over past week: 0-9/10  - aggravating factors: rotation, extension, lying down, looking down - Easing factors: changing positions, pillow    PRECAUTIONS: cancer, port RUE, osteoporosis  RED FLAGS: None  No N/T in UE, no recent visual changes (does endorse needing cataract surgery), no speech/swallowing issues. No balance issues.     WEIGHT BEARING RESTRICTIONS: No  FALLS:  Has patient fallen in last 6 months? No  LIVING ENVIRONMENT: 2 story house, no issues w/ stairs Lives with her dog  OCCUPATION: retired - used to run a [employer]  PLOF: Independent - spends time w/ family  PATIENT GOALS: feel better, be able to spend time with grandchildren  NEXT MD VISIT: January 2026  OBJECTIVE:  Note: Objective measures were completed at Evaluation unless otherwise noted.  DIAGNOSTIC FINDINGS:  11/06/23 cervical XR: IMPRESSION: Multilevel degenerative changes of the cervical spine, most pronounced at C4-5.   Given history of malignancy, if there is a persistent clinical concern for metastatic disease, this would be better assessed with dedicated cross-sectional imaging.  PATIENT SURVEYS:  NDI: 8/50; 16%  COGNITION: Overall cognitive status: Within functional limits for tasks assessed  SENSATION: Denies sensory issues   POSTURE: fwd head, rounded shoulders, BIL UT elevation R>LR  PALPATION: TTP to BIL UT, LS, rhomboids, nonconcordant per pt report; no significant tenderness to paracervicals or suboccipitals, does have significant SCM/scalene tightness BIL   CERVICAL ROM:   ROM A/PROM (deg) eval AROM 12/04/23  Flexion 75%  100%  Extension 50%    Right lateral flexion    Left lateral flexion    Right rotation 68 deg 45 deg  Left rotation 52 deg 40 deg   (Blank rows = not tested) (Key: WFL = within functional limits not formally assessed, * = concordant pain, s = stiffness/stretching sensation, NT = not tested) Comment:  UPPER EXTREMITY ROM:  A/PROM Right eval Left eval  Shoulder flexion    Shoulder abduction    Shoulder internal rotation    Shoulder external rotation    Elbow flexion    Elbow extension    Wrist flexion    Wrist extension     (Blank rows = not tested) (Key: WFL = within functional limits not formally assessed, * = concordant pain, s = stiffness/stretching sensation, NT = not tested)  Comments: flexion symmetrical and painless BIL, L abduction limited d/t chronic shoulder issues per pt report  UPPER EXTREMITY MMT:  MMT Right eval Left eval  Shoulder  flexion    Shoulder extension 5 5  Shoulder abduction 5 5  Shoulder extension    Shoulder internal rotation    Shoulder external rotation    Elbow flexion    Elbow extension    Grip strength    (Blank rows = not tested)  (Key: WFL = within functional limits not formally assessed, * = concordant pain, s = stiffness/stretching sensation, NT = not tested)  Comments:   CERVICAL SPECIAL TESTS:  DNF endurance test 20sec w/ fatigue     TREATMENT DATE:  [facility] Adult PT Treatment:                                                DATE: 12/04/23 Therapeutic Exercise: Supine w/ pillow support; cervical extension (looking up) w/ 5sec hold; 2x8, cues for comfortable ROM, breath control Shoulder shrugs 2x10 emphasis on scapular depression, breath control HEP update + education  Neuromuscular re-ed: Standing double ER + scap retraction at wall for posture; x12 unresisted, 3x8 RB  Standing shoulder flexion AROM BIL at wall 2x8  Standing chin tuck ball at wall, x8; difficulty with controlling movement Standing chin tuck w/ deflated ball at wall, x8; improved form, same discomfort    [facility] Adult PT Treatment:                                                DATE: 11/30/2023 Therapeutic Exercise: Supine cervical AROM  all directions + deflated ball underneath head Seated:  Platysma pin & stretch UT & LS  stretches  Supine shoulder flexion --> long towel roll along spine & horizontal across scaps Manual Therapy: STM cervical paraspinals, UT/LS, temporalis TPR pterygoids Neuromuscular re-ed: Supine DNF head lift 4x20 Cervical extension isometric + tactile assist from therapist hands Standing scapula retraction with tactile cues from noodle  [facility] Adult PT Treatment:                                                DATE: 11/27/23 Therapeutic Exercise: Supine cervical rotation x8 BIL cues for comfortable ROM and breath control Supine chin tuck 2 pillows 2x10 cues for forms Supine shoulder flexion AROM  emphasis on thoracic extension x12  HEP update + education  Manual Therapy: Supine and seated; gentle STM L>R UT, LS, paracervicals; gentle trigger point release L UT and LS Improved cervical rotation towards L post MT                                                                           PATIENT EDUCATION:  Education details: Updated HEP  Person educated: Patient Education method: Explanation, Demonstration, Tactile cues, Verbal cues Education comprehension: verbalized understanding, returned demonstration, verbal cues required, tactile cues required, and needs further education     HOME EXERCISE PROGRAM: Access Code: [email or web address] URL: [email or web address] Date: 12/04/2023 Prepared by: Alm Jenny  Exercises - Supine Shoulder Flexion Extension Full Range AROM  - 2-3 x daily - 1 sets - 8-10 reps - Supine Cervical Rotation AROM on Pillow  - 2-3 x daily - 1 sets - 8-10 reps - Supine Cervical Flexion Extension on Pillow  - 2-3 x daily - 1 sets - 8-10 reps - Standing Shoulder Shrugs  - 2-3 x daily - 1 sets - 8-10 reps  ASSESSMENT:  CLINICAL IMPRESSION: 12/04/2023: Pt arrives w/ report of increased pain with cervical mobility work, some equivocal improvement in headache location/severity but overall reporting she feels about the same. Today we focus more so on cervical stability and periscapular/thoracic mobility. She has some transient discomfort w/ chin tucks using ball, tendency towards heavy compensations into cervical flexion w/ difficulty correcting. Best tolerance w/ addition of cervical extension in supine to allow for relaxation of anterior neck musculature. No adverse events, reports baseline symptoms on departure but reduced stiffness and improved ROM is noted (6-8 deg each side). Recommend continuing along current POC in order to address relevant deficits and improve functional tolerance. Pt departs today's session in no acute distress, all voiced  questions/concerns addressed appropriately from PT perspective.     Per eval: Patient is a pleasant 73 y.o. woman who was seen today for physical therapy evaluation and treatment for neck pain + headaches ongoing since beginning of year. Tends to occur with general neck movement and with prolonged positioning in more flexed position. On exam she demonstrates concordant discomfort with cervical mobility although does not provoke headache, does endorse fatigue with DNF flexion test. Non-concordant TTP throughout postural musculature although suboccipitals/paracervicals are nontender. Does have significant scalene/SCM tightness to palpation. No  adverse events, tolerates exam/HEP well overall. Recommend trial of skilled PT to address aforementioned deficits with aim of improving functional tolerance and reducing pain with typical activities. Pt departs today's session in no acute distress, all voiced concerns/questions addressed appropriately from PT perspective.      OBJECTIVE IMPAIRMENTS: decreased activity tolerance, decreased endurance, decreased ROM, impaired perceived functional ability, postural dysfunction, and pain.   ACTIVITY LIMITATIONS: sitting and sleeping  PARTICIPATION LIMITATIONS: driving and community activity  PERSONAL FACTORS: Age, Time since onset of injury/illness/exacerbation, and 3+ comorbidities: GERD, hx radiation therapy, HTN, IBS, ileostomy, neuropathy (chemo), osteoporosis, ovarian cancer, palpitations are also affecting patient's functional outcome.   REHAB POTENTIAL: Good  CLINICAL DECISION MAKING: Evolving/moderate complexity  EVALUATION COMPLEXITY: Moderate   GOALS:   SHORT TERM GOALS: Target date: 12/19/2023  Pt will demonstrate appropriate understanding and performance of initially prescribed HEP in order to facilitate improved independence with management of symptoms.  Baseline: HEP established  Goal status: INITIAL   2. Pt will report at least 25%  improvement in overall pain levels over past week in order to facilitate improved tolerance to typical daily activities.   Baseline: 0-9/10  Goal status: INITIAL    LONG TERM GOALS: Target date: 01/16/2024  Pt will score less than or equal to 5% on NDI in order to demonstrate improved perception of function due to symptoms (MDC 10-13 pts per Neysa dunker al 2009, 2010). Baseline: 16% Goal status: INITIAL  2. Pt will demonstrate at least 60 degrees of active cervical rotation ROM BIL in order to demonstrate improved environmental awareness and safety with driving.  Baseline: see ROM chart above Goal status: INITIAL  3. Pt will demonstrate at least 50% improvement in supine DNF endurance test in order to facilitate improved activity tolerance. Baseline: 20sec with nonpainful fatigue Goal status: INITIAL   4. Pt will report at least 50% reduction in frequency/intensity of headaches in order to improve tolerance to usual tasks. Baseline: 0% Goal status: INITIAL   5. Pt will demonstrate appropriate performance of final prescribed HEP in order to facilitate improved self-management of symptoms post-discharge.   Baseline: initial HEP prescribed  Goal status: INITIAL     PLAN:  PT FREQUENCY: 2x/week  PT DURATION: 8 weeks  PLANNED INTERVENTIONS: 97164- PT Re-evaluation, 97750- Physical Performance Testing, 97110-Therapeutic exercises, 97530- Therapeutic activity, [id number]- Neuromuscular re-education, 97535- Self Care, [id number]- Manual therapy, 20560 (1-2 muscles), 20561 (3+ muscles)- Dry Needling, Patient/Family education, Taping, Joint mobilization, Spinal mobilization, Cryotherapy, and Moist heat  PLAN FOR NEXT SESSION: Review/update HEP PRN. Would likely benefit from soft tissue work to periscapular musculature, consider incorporating SCM/scalenes as well. Work on [employer] endurance, periscapular/cervical stability. Symptom modification strategies as indicated/appropriate. Mindful of active cancer w/  recent radiation therapy, history of osteoporosis, RUE port.    Alm DELENA Jenny PT, DPT 12/04/2023 10:17 AM

## 2023-12-04 ENCOUNTER — Encounter: Payer: Self-pay | Admitting: Physical Therapy

## 2023-12-04 ENCOUNTER — Ambulatory Visit: Admitting: Physical Therapy

## 2023-12-04 DIAGNOSIS — R293 Abnormal posture: Secondary | ICD-10-CM | POA: Diagnosis not present

## 2023-12-04 DIAGNOSIS — R519 Headache, unspecified: Secondary | ICD-10-CM | POA: Diagnosis not present

## 2023-12-04 DIAGNOSIS — M542 Cervicalgia: Secondary | ICD-10-CM | POA: Diagnosis not present

## 2023-12-06 NOTE — Therapy (Signed)
 OUTPATIENT PHYSICAL THERAPY TREATMENT   Patient Name: Maria Romero MRN: [id number] DOB:July 17, 1950, 73 y.o., female Today's Date: 12/07/2023  END OF SESSION:  PT End of Session - 12/07/23 1058     Visit Number 5    Number of Visits 17    Date for PT Re-Evaluation 01/16/24    Authorization Type [employer] MDC    Authorization Time Period 16 visits approved for PT 11/21/2023-01/16/2024    Authorization - Visit Number 5    Authorization - Number of Visits 16    Progress Note Due on Visit 10    PT Start Time 1101    PT Stop Time 1142    PT Time Calculation (min) 41 min           Past Medical History:  Diagnosis Date   Anemia    Complication of anesthesia    Dyspnea    GERD (gastroesophageal reflux disease)    Goals of care, counseling/discussion 12/01/2021   History of radiation therapy    Abdomen-10/02/23-10/16/23- [name]   Hypercholesterolemia    Hypertension    IBS (irritable bowel syndrome)    Ileostomy in place [facility])    Malignant ascites 11/19/2021   Neuromuscular disorder (HCC)    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   Osteopenia    Osteoporosis    [name], right (HCC) 12/01/2021   Ovarian mass, right 11/19/2021   PONV (postoperative nausea and vomiting)    Toe fracture    lt small toe   Past Surgical History:  Procedure Laterality Date   BOWEL RESECTION N/A 04/20/2022   Procedure: BOWEL RESECTION; DIVERTING ILEOSTOMY;  Surgeon: Viktoria Comer SAUNDERS, MD;  Location: [facility];  Service: Gynecology;  Laterality: N/A;   DEBULKING N/A 04/20/2022   Procedure: TUMOR DEBULKING, OMENTECTOMY;  Surgeon: Viktoria Comer SAUNDERS, MD;  Location: [facility];  Service: Gynecology;  Laterality: N/A;   HYSTERECTOMY ABDOMINAL WITH SALPINGO-OOPHORECTOMY Bilateral 04/20/2022   Procedure: HYSTERECTOMY ABDOMINAL WITH BILATERAL SALPINGO-OOPHORECTOMY;  Surgeon: Viktoria Comer SAUNDERS, MD;  Location: [facility];  Service: Gynecology;  Laterality: Bilateral;   ILEOSTOMY CLOSURE N/A 09/07/2022   Procedure:  ILEOSTOMY TAKEDOWN;  Surgeon: Viktoria Comer SAUNDERS, MD;  Location: [facility];  Service: General;  Laterality: N/A;   IR IMAGING GUIDED PORT INSERTION  11/26/2021   IR PARACENTESIS  11/23/2021   IR PARACENTESIS  11/26/2021   LAPAROSCOPY N/A 04/20/2022   Procedure: LAPAROSCOPY DIAGNOSTIC,CYSTO;  Surgeon: Viktoria Comer SAUNDERS, MD;  Location: [facility];  Service: Gynecology;  Laterality: N/A;   lump removal  04/11/1993   axilla right - benign   TONSILLECTOMY     TUBAL LIGATION  04/11/1988   Patient Active Problem List   Diagnosis Date Noted   GERD (gastroesophageal reflux disease) 04/13/2023   Trigger thumb, left thumb 02/24/2023   Impingement syndrome, shoulder, left 02/24/2023   Hypomagnesemia 09/10/2022   Ileostomy in place [facility]) 09/07/2022   Ileostomy present (HCC) 09/07/2022   Lactic acidosis 05/04/2022   Hypocalcemia 04/23/2022   Ovarian cancer (HCC) 04/20/2022   Genetic testing 01/24/2022   Neutropenia (HCC) 12/15/2021   Hyponatremia 12/15/2021   Hyperkalemia 12/15/2021   AKI (acute kidney injury) (HCC) 12/15/2021   Neutropenia, drug-induced (HCC) 12/14/2021   [name], right (HCC) 12/01/2021   Goals of care, counseling/discussion 12/01/2021   Malignant ascites 11/19/2021   Abdominal pain 11/08/2021   Osteoporosis 05/05/2021   Raynaud disease 02/09/2015   Bradycardia 08/25/2009   PALPITATIONS 08/25/2009   Abnormal levels of other serum enzymes 11/13/2007   Disorder  of bone and cartilage 05/22/2007   HYPERCHOLESTEROLEMIA 05/21/2007   IBS 05/21/2007   LEG PAIN, LEFT 05/21/2007    PCP: Alvan Dorothyann BIRCH, MD  REFERRING PROVIDER: Alvan Dorothyann BIRCH, MD  REFERRING DIAG: M54.2 (ICD-10-CM) - Cervical pain R51.9 (ICD-10-CM) - Frequent headaches  THERAPY DIAG:  Cervicalgia  Abnormal posture  Rationale for Evaluation and Treatment: Rehabilitation  ONSET DATE: beginning of 2025  SUBJECTIVE:                                                                                                                                                                                                         Per eval: Reports posterior neck pain and headaches since beginning of year. States she had a lot of reactions during her time with chemo, required hospitalization and rehab stay. Developed BIL TMJ, felt neck pain and headaches came afterwards. Has had some shoulder issues for which she has seen Dr. Curtis. States her headache is typically provoked by looking up from supine position (cervical flexion, watching TV in bed) Did receive therapy for balance and walking after her hospitalizations.  Just finished up radiation. Sees oncology this week.   SUBJECTIVE STATEMENT: 12/07/2023: a little better mostly in terms of headache, no longer getting it as frequently with sitting up in bed or with bending forward. Felt okay after last session. Feels a little bit of anterior neck soreness with exercises. States her neck pain feels about the same overall.    PERTINENT HISTORY:  GERD, hx radiation therapy, HTN, IBS, ileostomy, neuropathy (chemo), osteoporosis, ovarian cancer, palpitations Port on RUE   PAIN:  Are you having pain: 3/10 headache, 4-5/10 neck pain w/ movement  Per eval:  none at rest, 6/10 with movement Location/description: middle of neck, sometimes radiated to top of head when lying down Best-worst over past week: 0-9/10  - aggravating factors: rotation, extension, lying down, looking down - Easing factors: changing positions, pillow    PRECAUTIONS: cancer, port RUE, osteoporosis  RED FLAGS: None  No N/T in UE, no recent visual changes (does endorse needing cataract surgery), no speech/swallowing issues. No balance issues.     WEIGHT BEARING RESTRICTIONS: No  FALLS:  Has patient fallen in last 6 months? 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  DIAGNOSTIC FINDINGS:  11/06/23 cervical XR: IMPRESSION: Multilevel degenerative changes of the cervical spine, most pronounced at C4-5.   Given history of malignancy, if there is a persistent clinical concern for metastatic disease, this would be better assessed with dedicated cross-sectional imaging.  PATIENT SURVEYS:  NDI: 8/50; 16%  COGNITION: Overall cognitive status: Within functional limits for tasks assessed  SENSATION: Denies sensory issues   POSTURE: fwd head, rounded shoulders, BIL UT elevation R>LR  PALPATION: TTP to BIL UT, LS, rhomboids, nonconcordant per pt report; no significant tenderness to paracervicals or suboccipitals, does have significant SCM/scalene tightness BIL   CERVICAL ROM:   ROM A/PROM (deg) eval AROM 12/04/23  Flexion 75%  100%  Extension 50%    Right lateral flexion    Left lateral flexion    Right rotation 68 deg 45 deg  Left rotation 52 deg 40 deg   (Blank rows = not tested) (Key: WFL = within functional limits not formally assessed, * = concordant pain, s = stiffness/stretching sensation, NT = not tested) Comment:  UPPER EXTREMITY ROM:  A/PROM Right eval Left eval  Shoulder flexion    Shoulder abduction    Shoulder internal rotation    Shoulder external rotation    Elbow flexion    Elbow extension    Wrist flexion    Wrist extension     (Blank rows = not tested) (Key: WFL = within functional limits not formally assessed, * = concordant pain, s = stiffness/stretching sensation, NT = not tested)  Comments: flexion symmetrical and painless BIL, L abduction limited d/t chronic shoulder issues per pt report  UPPER EXTREMITY MMT:  MMT Right eval Left eval  Shoulder flexion     Shoulder extension 5 5  Shoulder abduction 5 5  Shoulder extension    Shoulder internal rotation    Shoulder external rotation    Elbow flexion    Elbow extension    Grip strength    (Blank rows = not tested)  (Key: WFL = within functional limits not formally assessed, * = concordant pain, s = stiffness/stretching sensation, NT = not tested)  Comments:   CERVICAL SPECIAL TESTS:  DNF endurance test 20sec w/ fatigue     TREATMENT DATE:  [facility] Adult PT Treatment:                                                DATE: 12/07/23 Therapeutic Exercise: Supine w/ pillow support; supported cervical extension 2x12 cues for comfortable ROM and breath control Supine shoulder flexion AROM (BIL) 2x8 cues for comfortable ROM Shoulder shrug 2x12 BIL emphasis on scapular depression HEP education/discussion  Neuromuscular re-ed: Cervical retraction iso (ball at wall) 2x8 cues to avoid truncal compensations BIL scaption w/ RB pull apart 2x10, cues for posture and scapular mechanics    [facility] Adult PT Treatment:                                                DATE: 12/04/23 Therapeutic Exercise: Supine w/ pillow support; cervical extension (looking up) w/ 5sec hold; 2x8, cues for comfortable ROM, breath control Shoulder shrugs 2x10 emphasis on scapular depression, breath control HEP update + education  Neuromuscular re-ed: Standing double ER + scap retraction at wall for posture; x12 unresisted, 3x8 RB  Standing shoulder flexion AROM BIL at wall 2x8  Standing chin tuck ball at wall, x8; difficulty with controlling movement Standing chin tuck w/ deflated ball at wall, x8; improved form, same discomfort    [facility] Adult PT Treatment:                                                DATE: 11/30/2023 Therapeutic Exercise: Supine cervical AROM all directions + deflated ball underneath head Seated:  Platysma pin & stretch UT & LS  stretches  Supine shoulder flexion --> long towel roll along spine &  horizontal across scaps Manual Therapy: STM cervical paraspinals, UT/LS, temporalis TPR pterygoids Neuromuscular re-ed: Supine DNF head lift 4x20 Cervical extension isometric + tactile assist from therapist hands Standing scapula retraction with tactile cues from noodle                                                                           PATIENT EDUCATION:  Education details: rationale for interventions, HEP  Person educated: Patient Education method: Explanation, Demonstration, Tactile cues, Verbal cues Education comprehension: verbalized understanding, returned demonstration, verbal cues required, tactile cues required, and needs further education     HOME EXERCISE PROGRAM: Access Code: [email or web address] URL: [email or web address] Date: 12/04/2023 Prepared by: Alm Jenny  Exercises - Supine Shoulder Flexion Extension Full Range AROM  - 2-3 x daily - 1 sets - 8-10 reps - Supine Cervical Rotation AROM on Pillow  - 2-3 x daily - 1 sets - 8-10 reps - Supine Cervical Flexion Extension on Pillow  - 2-3 x daily - 1 sets - 8-10 reps - Standing Shoulder Shrugs  - 2-3 x daily - 1 sets - 8-10 reps  ASSESSMENT:  CLINICAL IMPRESSION: 12/07/2023: Pt arrives w/ report of modest improvement in headache, no change in neck pain. She does endorse a bit of anterior neck soreness w/ extension exercises so today we focus on improving mobility of anterior musculature, stability of periscapular/cervical musculature. Tolerates activity well without adverse event, reports resolved headache and no increase in pain on departure. Recommend continuing along current POC in order to address relevant deficits and improve functional tolerance. Pt departs today's session in no acute distress, all voiced questions/concerns addressed appropriately from PT perspective.       Per eval: Patient is a pleasant 73 y.o. woman who was seen today for physical therapy evaluation and treatment for neck pain +  headaches ongoing since beginning of year. Tends to occur with general neck movement and with prolonged positioning in more flexed position. On exam she demonstrates concordant discomfort with cervical mobility although does not provoke headache, does endorse fatigue with DNF flexion test. Non-concordant TTP throughout postural musculature although suboccipitals/paracervicals are nontender. Does have significant scalene/SCM tightness to palpation. No adverse events, tolerates exam/HEP well overall. Recommend trial of skilled PT to address aforementioned deficits with aim of improving functional tolerance and reducing pain with typical activities. Pt departs today's session in no acute distress, all voiced concerns/questions addressed appropriately  from PT perspective.      OBJECTIVE IMPAIRMENTS: decreased activity tolerance, decreased endurance, decreased ROM, impaired perceived functional ability, postural dysfunction, and pain.   ACTIVITY LIMITATIONS: sitting and sleeping  PARTICIPATION LIMITATIONS: driving and community activity  PERSONAL FACTORS: Age, Time since onset of injury/illness/exacerbation, and 3+ comorbidities: GERD, hx radiation therapy, HTN, IBS, ileostomy, neuropathy (chemo), osteoporosis, ovarian cancer, palpitations are also affecting patient's functional outcome.   REHAB POTENTIAL: Good  CLINICAL DECISION MAKING: Evolving/moderate complexity  EVALUATION COMPLEXITY: Moderate   GOALS:   SHORT TERM GOALS: Target date: 12/19/2023  Pt will demonstrate appropriate understanding and performance of initially prescribed HEP in order to facilitate improved independence with management of symptoms.  Baseline: HEP established  Goal status: INITIAL   2. Pt will report at least 25% improvement in overall pain levels over past week in order to facilitate improved tolerance to typical daily activities.   Baseline: 0-9/10  Goal status: INITIAL    LONG TERM GOALS: Target date:  01/16/2024  Pt will score less than or equal to 5% on NDI in order to demonstrate improved perception of function due to symptoms (MDC 10-13 pts per Neysa dunker al 2009, 2010). Baseline: 16% Goal status: INITIAL  2. Pt will demonstrate at least 60 degrees of active cervical rotation ROM BIL in order to demonstrate improved environmental awareness and safety with driving.  Baseline: see ROM chart above Goal status: INITIAL  3. Pt will demonstrate at least 50% improvement in supine DNF endurance test in order to facilitate improved activity tolerance. Baseline: 20sec with nonpainful fatigue Goal status: INITIAL   4. Pt will report at least 50% reduction in frequency/intensity of headaches in order to improve tolerance to usual tasks. Baseline: 0% Goal status: INITIAL   5. Pt will demonstrate appropriate performance of final prescribed HEP in order to facilitate improved self-management of symptoms post-discharge.   Baseline: initial HEP prescribed  Goal status: INITIAL     PLAN:  PT FREQUENCY: 2x/week  PT DURATION: 8 weeks  PLANNED INTERVENTIONS: 97164- PT Re-evaluation, 97750- Physical Performance Testing, 97110-Therapeutic exercises, 97530- Therapeutic activity, [id number]- Neuromuscular re-education, 97535- Self Care, [id number]- Manual therapy, 20560 (1-2 muscles), 20561 (3+ muscles)- Dry Needling, Patient/Family education, Taping, Joint mobilization, Spinal mobilization, Cryotherapy, and Moist heat  PLAN FOR NEXT SESSION: Review/update HEP PRN. Would likely benefit from soft tissue work to periscapular musculature, consider incorporating SCM/scalenes as well. Work on [employer] endurance, periscapular/cervical stability. Symptom modification strategies as indicated/appropriate. Mindful of active cancer w/ recent radiation therapy, history of osteoporosis, RUE port.    Alm DELENA Jenny PT, DPT 12/07/2023 11:45 AM

## 2023-12-07 ENCOUNTER — Ambulatory Visit: Admitting: Physical Therapy

## 2023-12-07 ENCOUNTER — Encounter: Payer: Self-pay | Admitting: Physical Therapy

## 2023-12-07 DIAGNOSIS — M542 Cervicalgia: Secondary | ICD-10-CM

## 2023-12-07 DIAGNOSIS — R293 Abnormal posture: Secondary | ICD-10-CM | POA: Diagnosis not present

## 2023-12-07 DIAGNOSIS — R519 Headache, unspecified: Secondary | ICD-10-CM | POA: Diagnosis not present

## 2023-12-12 ENCOUNTER — Ambulatory Visit: Attending: Family Medicine | Admitting: Physical Therapy

## 2023-12-12 ENCOUNTER — Encounter: Payer: Self-pay | Admitting: Sports Medicine

## 2023-12-12 ENCOUNTER — Telehealth: Payer: Self-pay

## 2023-12-12 ENCOUNTER — Encounter: Payer: Self-pay | Admitting: Physical Therapy

## 2023-12-12 DIAGNOSIS — R293 Abnormal posture: Secondary | ICD-10-CM | POA: Diagnosis not present

## 2023-12-12 DIAGNOSIS — M542 Cervicalgia: Secondary | ICD-10-CM | POA: Insufficient documentation

## 2023-12-12 NOTE — Therapy (Signed)
 OUTPATIENT PHYSICAL THERAPY TREATMENT   Patient Name: Maria Romero MRN: [id number] DOB:1950-06-02, 73 y.o., female Today's Date: 12/12/2023  END OF SESSION:  PT End of Session - 12/12/23 1016     Visit Number 6    Number of Visits 17    Date for PT Re-Evaluation 01/16/24    Authorization Type [employer] MDC    Authorization Time Period 16 visits approved for PT 11/21/2023-01/16/2024    Authorization - Visit Number 6    Authorization - Number of Visits 16    Progress Note Due on Visit 10    PT Start Time 1016    PT Stop Time 1102    PT Time Calculation (min) 46 min            Past Medical History:  Diagnosis Date   Anemia    Complication of anesthesia    Dyspnea    GERD (gastroesophageal reflux disease)    Goals of care, counseling/discussion 12/01/2021   History of radiation therapy    Abdomen-10/02/23-10/16/23- [name]   Hypercholesterolemia    Hypertension    IBS (irritable bowel syndrome)    Ileostomy in place [facility])    Malignant ascites 11/19/2021   Neuromuscular disorder (HCC)    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   Osteopenia    Osteoporosis    [name], right (HCC) 12/01/2021   Ovarian mass, right 11/19/2021   PONV (postoperative nausea and vomiting)    Toe fracture    lt small toe   Past Surgical History:  Procedure Laterality Date   BOWEL RESECTION N/A 04/20/2022   Procedure: BOWEL RESECTION; DIVERTING ILEOSTOMY;  Surgeon: Viktoria Comer SAUNDERS, MD;  Location: [facility];  Service: Gynecology;  Laterality: N/A;   DEBULKING N/A 04/20/2022   Procedure: TUMOR DEBULKING, OMENTECTOMY;  Surgeon: Viktoria Comer SAUNDERS, MD;  Location: [facility];  Service: Gynecology;  Laterality: N/A;   HYSTERECTOMY ABDOMINAL WITH SALPINGO-OOPHORECTOMY Bilateral 04/20/2022   Procedure: HYSTERECTOMY ABDOMINAL WITH BILATERAL SALPINGO-OOPHORECTOMY;  Surgeon: Viktoria Comer SAUNDERS, MD;  Location: [facility];  Service: Gynecology;  Laterality: Bilateral;   ILEOSTOMY CLOSURE N/A 09/07/2022   Procedure:  ILEOSTOMY TAKEDOWN;  Surgeon: Viktoria Comer SAUNDERS, MD;  Location: [facility];  Service: General;  Laterality: N/A;   IR IMAGING GUIDED PORT INSERTION  11/26/2021   IR PARACENTESIS  11/23/2021   IR PARACENTESIS  11/26/2021   LAPAROSCOPY N/A 04/20/2022   Procedure: LAPAROSCOPY DIAGNOSTIC,CYSTO;  Surgeon: Viktoria Comer SAUNDERS, MD;  Location: [facility];  Service: Gynecology;  Laterality: N/A;   lump removal  04/11/1993   axilla right - benign   TONSILLECTOMY     TUBAL LIGATION  04/11/1988   Patient Active Problem List   Diagnosis Date Noted   GERD (gastroesophageal reflux disease) 04/13/2023   Trigger thumb, left thumb 02/24/2023   Impingement syndrome, shoulder, left 02/24/2023   Hypomagnesemia 09/10/2022   Ileostomy in place [facility]) 09/07/2022   Ileostomy present (HCC) 09/07/2022   Lactic acidosis 05/04/2022   Hypocalcemia 04/23/2022   Ovarian cancer (HCC) 04/20/2022   Genetic testing 01/24/2022   Neutropenia (HCC) 12/15/2021   Hyponatremia 12/15/2021   Hyperkalemia 12/15/2021   AKI (acute kidney injury) (HCC) 12/15/2021   Neutropenia, drug-induced (HCC) 12/14/2021   [name], right (HCC) 12/01/2021   Goals of care, counseling/discussion 12/01/2021   Malignant ascites 11/19/2021   Abdominal pain 11/08/2021   Osteoporosis 05/05/2021   Raynaud disease 02/09/2015   Bradycardia 08/25/2009   PALPITATIONS 08/25/2009   Abnormal levels of other serum enzymes 11/13/2007  Disorder of bone and cartilage 05/22/2007   HYPERCHOLESTEROLEMIA 05/21/2007   IBS 05/21/2007   LEG PAIN, LEFT 05/21/2007    PCP: Alvan Dorothyann BIRCH, MD  REFERRING PROVIDER: Alvan Dorothyann BIRCH, MD  REFERRING DIAG: M54.2 (ICD-10-CM) - Cervical pain R51.9 (ICD-10-CM) - Frequent headaches  THERAPY DIAG:  Cervicalgia  Abnormal posture  Rationale for Evaluation and Treatment: Rehabilitation  ONSET DATE: beginning of 2025  SUBJECTIVE:                                                                                                                                                                                                         Per eval: Reports posterior neck pain and headaches since beginning of year. States she had a lot of reactions during her time with chemo, required hospitalization and rehab stay. Developed BIL TMJ, felt neck pain and headaches came afterwards. Has had some shoulder issues for which she has seen Dr. Curtis. States her headache is typically provoked by looking up from supine position (cervical flexion, watching TV in bed) Did receive therapy for balance and walking after her hospitalizations.  Just finished up radiation. Sees oncology this week.   SUBJECTIVE STATEMENT: 12/12/2023: overall still getting less of her headaches but still fluctuating. Feels like neck mobility is improving. Notes an episode of vision loss Saturday, see assessment for details.    PERTINENT HISTORY:  GERD, hx radiation therapy, HTN, IBS, ileostomy, neuropathy (chemo), osteoporosis, ovarian cancer, palpitations Port on RUE   PAIN:  Are you having pain: no pain, 1/10 headache L side only   Per eval:  none at rest, 6/10 with movement Location/description: middle of neck, sometimes radiated to top of head when lying down Best-worst over past week: 0-9/10  - aggravating factors: rotation, extension, lying down, looking down - Easing factors: changing positions, pillow    PRECAUTIONS: cancer, port RUE, osteoporosis  RED FLAGS: None  No N/T in UE, no recent visual changes (does endorse needing cataract surgery), no speech/swallowing issues. No balance issues.     WEIGHT BEARING RESTRICTIONS: No  FALLS:  Has patient fallen in last 6 months? 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  DIAGNOSTIC FINDINGS:  11/06/23 cervical XR: IMPRESSION: Multilevel degenerative  changes of the cervical spine, most pronounced at C4-5.   Given history of malignancy, if there is a persistent clinical concern for metastatic disease, this would be better assessed with dedicated cross-sectional imaging.  PATIENT SURVEYS:  NDI: 8/50; 16%  COGNITION: Overall cognitive status: Within functional limits for tasks assessed  SENSATION: Denies sensory issues   POSTURE: fwd head, rounded shoulders, BIL UT elevation R>LR  PALPATION: TTP to BIL UT, LS, rhomboids, nonconcordant per pt report; no significant tenderness to paracervicals or suboccipitals, does have significant SCM/scalene tightness BIL   CERVICAL ROM:   ROM A/PROM (deg) eval AROM 12/04/23  Flexion 75%  100%  Extension 50%    Right lateral flexion    Left lateral flexion    Right rotation 68 deg 45 deg  Left rotation 52 deg 40 deg   (Blank rows = not tested) (Key: WFL = within functional limits not formally assessed, * = concordant pain, s = stiffness/stretching sensation, NT = not tested) Comment:  UPPER EXTREMITY ROM:  A/PROM Right eval Left eval  Shoulder flexion    Shoulder abduction    Shoulder internal rotation    Shoulder external rotation    Elbow flexion    Elbow extension    Wrist flexion    Wrist extension     (Blank rows = not tested) (Key: WFL = within functional limits not formally assessed, * = concordant pain, s = stiffness/stretching sensation, NT = not tested)  Comments: flexion symmetrical and painless BIL, L abduction limited d/t chronic shoulder issues per pt report  UPPER EXTREMITY MMT:  MMT Right eval Left eval  Shoulder flexion    Shoulder extension 5 5  Shoulder abduction 5 5  Shoulder extension    Shoulder internal rotation    Shoulder  external rotation    Elbow flexion    Elbow extension    Grip strength    (Blank rows = not tested)  (Key: WFL = within functional limits not formally assessed, * = concordant pain, s = stiffness/stretching sensation, NT = not tested)  Comments:   CERVICAL SPECIAL TESTS:  DNF endurance test 20sec w/ fatigue     TREATMENT DATE:  [facility] Adult PT Treatment:                                                DATE: 12/12/23 Therapeutic Exercise: Standing shoulder abduction full arc, emphasis on postural extension 2x10  Standing w/ half foam roll; 45 deg row unresisted x10 emphasis on avoiding UT compensations Standing w/ half foam roll; W unresisted x10  HEP update + education/handout  Neuromuscular re-ed: Seated shoulder shrug x12 cues for motor control, posture, emphasis on scapular depression Seated scapular depression isometric push; (on airex pad, pushing into towels) 2x10 cues for posture and reducing tricep activation  Self Care: Education/discussion re: episode of vision loss on Saturday (see assessment for details); communication w/ PCP, monitoring for red flags and indications for more urgent/emergent work up    [facility] Adult PT Treatment:                                                DATE: 12/07/23 Therapeutic Exercise: Supine w/ pillow support; supported cervical extension 2x12 cues for  comfortable ROM and breath control Supine shoulder flexion AROM (BIL) 2x8 cues for comfortable ROM Shoulder shrug 2x12 BIL emphasis on scapular depression HEP education/discussion  Neuromuscular re-ed: Cervical retraction iso (ball at wall) 2x8 cues to avoid truncal compensations BIL scaption w/ RB pull apart 2x10, cues for posture and scapular mechanics    [facility] Adult PT Treatment:                                                DATE: 12/04/23 Therapeutic Exercise: Supine w/ pillow support; cervical extension (looking up) w/ 5sec hold; 2x8, cues for comfortable ROM, breath control Shoulder  shrugs 2x10 emphasis on scapular depression, breath control HEP update + education  Neuromuscular re-ed: Standing double ER + scap retraction at wall for posture; x12 unresisted, 3x8 RB  Standing shoulder flexion AROM BIL at wall 2x8  Standing chin tuck ball at wall, x8; difficulty with controlling movement Standing chin tuck w/ deflated ball at wall, x8; improved form, same discomfort                                                                       PATIENT EDUCATION:  Education details: rationale for interventions, HEP  Person educated: Patient Education method: Explanation, Demonstration, Tactile cues, Verbal cues Education comprehension: verbalized understanding, returned demonstration, verbal cues required, tactile cues required, and needs further education     HOME EXERCISE PROGRAM: Access Code: [email or web address] URL: [email or web address] Date: 12/12/2023 Prepared by: Alm Jenny  Exercises - Supine Shoulder Flexion Extension Full Range AROM  - 2-3 x daily - 1 sets - 8-10 reps - Supine Cervical Rotation AROM on Pillow  - 2-3 x daily - 1 sets - 8-10 reps - Supine Cervical Flexion Extension on Pillow  - 2-3 x daily - 1 sets - 8-10 reps - Standing Shoulder Shrugs  - 2-3 x daily - 1 sets - 8-10 reps - Seated Shoulder Press Ups Off Table  - 2-3 x daily - 1 sets - 8 reps  ASSESSMENT:  CLINICAL IMPRESSION: 12/12/2023: Pt arrives w/ report of continued modest improvement. She does note an episode of L sided vision loss Saturday evening while resting in bed, unrelated to activity and unable to discern any apparent provocative factors. She endorses history of migraines w/ visual auras but notes this was distinct and unfamiliar, she denies any other red flag symptoms or neurological complaints. States symptoms persisted about 10-15 min, self resolved and has not had any recurrence since, no new symptoms as relates to her headache/neck pain. Pt states she plans to stop by PCP  office to discuss with them after our visit today, this writer reached out via secure chat to notify them. Pt states she would like to continue with PT today, focusing on postural endurance and scapular motor control. Tolerates well with report of improved cervical mobility and reduced provocation of headache on departure. No adverse events. Recommend continuing along current POC in order to address relevant deficits and improve functional tolerance. Pt departs today's session in no acute distress, all voiced questions/concerns addressed appropriately from PT perspective.    Per eval: Patient  is a pleasant 73 y.o. woman who was seen today for physical therapy evaluation and treatment for neck pain + headaches ongoing since beginning of year. Tends to occur with general neck movement and with prolonged positioning in more flexed position. On exam she demonstrates concordant discomfort with cervical mobility although does not provoke headache, does endorse fatigue with DNF flexion test. Non-concordant TTP throughout postural musculature although suboccipitals/paracervicals are nontender. Does have significant scalene/SCM tightness to palpation. No adverse events, tolerates exam/HEP well overall. Recommend trial of skilled PT to address aforementioned deficits with aim of improving functional tolerance and reducing pain with typical activities. Pt departs today's session in no acute distress, all voiced concerns/questions addressed appropriately from PT perspective.      OBJECTIVE IMPAIRMENTS: decreased activity tolerance, decreased endurance, decreased ROM, impaired perceived functional ability, postural dysfunction, and pain.   ACTIVITY LIMITATIONS: sitting and sleeping  PARTICIPATION LIMITATIONS: driving and community activity  PERSONAL FACTORS: Age, Time since onset of injury/illness/exacerbation, and 3+ comorbidities: GERD, hx radiation therapy, HTN, IBS, ileostomy, neuropathy (chemo), osteoporosis,  ovarian cancer, palpitations are also affecting patient's functional outcome.   REHAB POTENTIAL: Good  CLINICAL DECISION MAKING: Evolving/moderate complexity  EVALUATION COMPLEXITY: Moderate   GOALS:   SHORT TERM GOALS: Target date: 12/19/2023  Pt will demonstrate appropriate understanding and performance of initially prescribed HEP in order to facilitate improved independence with management of symptoms.  Baseline: HEP established  Goal status: INITIAL   2. Pt will report at least 25% improvement in overall pain levels over past week in order to facilitate improved tolerance to typical daily activities.   Baseline: 0-9/10  Goal status: INITIAL    LONG TERM GOALS: Target date: 01/16/2024  Pt will score less than or equal to 5% on NDI in order to demonstrate improved perception of function due to symptoms (MDC 10-13 pts per Neysa dunker al 2009, 2010). Baseline: 16% Goal status: INITIAL  2. Pt will demonstrate at least 60 degrees of active cervical rotation ROM BIL in order to demonstrate improved environmental awareness and safety with driving.  Baseline: see ROM chart above Goal status: INITIAL  3. Pt will demonstrate at least 50% improvement in supine DNF endurance test in order to facilitate improved activity tolerance. Baseline: 20sec with nonpainful fatigue Goal status: INITIAL   4. Pt will report at least 50% reduction in frequency/intensity of headaches in order to improve tolerance to usual tasks. Baseline: 0% Goal status: INITIAL   5. Pt will demonstrate appropriate performance of final prescribed HEP in order to facilitate improved self-management of symptoms post-discharge.   Baseline: initial HEP prescribed  Goal status: INITIAL     PLAN:  PT FREQUENCY: 2x/week  PT DURATION: 8 weeks  PLANNED INTERVENTIONS: 97164- PT Re-evaluation, 97750- Physical Performance Testing, 97110-Therapeutic exercises, 97530- Therapeutic activity, [id number]- Neuromuscular re-education,  97535- Self Care, [id number]- Manual therapy, 20560 (1-2 muscles), 20561 (3+ muscles)- Dry Needling, Patient/Family education, Taping, Joint mobilization, Spinal mobilization, Cryotherapy, and Moist heat  PLAN FOR NEXT SESSION: Review/update HEP PRN. Would likely benefit from soft tissue work to periscapular musculature, consider incorporating SCM/scalenes as well. Work on [employer] endurance, periscapular/cervical stability. Symptom modification strategies as indicated/appropriate. Mindful of active cancer w/ recent radiation therapy, history of osteoporosis, RUE port.    Alm DELENA Jenny PT, DPT 12/12/2023 11:30 AM

## 2023-12-12 NOTE — Telephone Encounter (Signed)
 Patient stopped by office. Per Dr. Alvan physical therapist had notified her of a concern.   Patient states that on Sunday about 9pm  12/10/2023 she was watching TV and lost vision in Left eye. And what she could see on her phone was in black and white.  She phoned her daughter and vision slowing returned while she was speaking with her. She states she did have a slight headache with pain level  of about 1 or 2  top Left side of head - took Alleve and this resolved.  Patient states she does have a history of migraines with auras about one every 2 months.    Per Dr. Alvan she needs to contact her Ophthalmologist and be seen within this week as soon as possible to have optic nerve and retina checked.   After relaying the message from DR. Metheney . Patient did mention that she had upcoming consultation to initiate Cataract surgery in October 2025.  She also states she forgot to mentioned that she did have some white dots in her Periferal vision  Patient will stop by her Ophthalmologist office today to get appt .

## 2023-12-12 NOTE — Telephone Encounter (Signed)
 Agree with documentation as above.   Nani Gasser, MD

## 2023-12-14 ENCOUNTER — Ambulatory Visit

## 2023-12-14 ENCOUNTER — Telehealth: Payer: Self-pay

## 2023-12-14 DIAGNOSIS — M542 Cervicalgia: Secondary | ICD-10-CM | POA: Diagnosis not present

## 2023-12-14 DIAGNOSIS — R293 Abnormal posture: Secondary | ICD-10-CM

## 2023-12-14 DIAGNOSIS — H25813 Combined forms of age-related cataract, bilateral: Secondary | ICD-10-CM | POA: Diagnosis not present

## 2023-12-14 DIAGNOSIS — D3132 Benign neoplasm of left choroid: Secondary | ICD-10-CM | POA: Diagnosis not present

## 2023-12-14 DIAGNOSIS — G453 Amaurosis fugax: Secondary | ICD-10-CM

## 2023-12-14 DIAGNOSIS — G43109 Migraine with aura, not intractable, without status migrainosus: Secondary | ICD-10-CM | POA: Diagnosis not present

## 2023-12-14 NOTE — Therapy (Signed)
 OUTPATIENT PHYSICAL THERAPY TREATMENT   Patient Name: Maria Romero MRN: [id number] DOB:09/20/1950, 73 y.o., female Today's Date: 12/14/2023  END OF SESSION:  PT End of Session - 12/14/23 1104     Visit Number 7    Number of Visits 17    Date for PT Re-Evaluation 01/16/24    Authorization Type [employer] MDC    Authorization Time Period 16 visits approved for PT 11/21/2023-01/16/2024    Authorization - Visit Number 7    Authorization - Number of Visits 16    Progress Note Due on Visit 10    PT Start Time 1104    PT Stop Time 1148    PT Time Calculation (min) 44 min    Activity Tolerance Patient tolerated treatment well    Behavior During Therapy [facility] for tasks assessed/performed         Past Medical History:  Diagnosis Date   Anemia    Complication of anesthesia    Dyspnea    GERD (gastroesophageal reflux disease)    Goals of care, counseling/discussion 12/01/2021   History of radiation therapy    Abdomen-10/02/23-10/16/23- [name]   Hypercholesterolemia    Hypertension    IBS (irritable bowel syndrome)    Ileostomy in place [facility])    Malignant ascites 11/19/2021   Neuromuscular disorder (HCC)    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   Osteopenia    Osteoporosis    [name], right (HCC) 12/01/2021   Ovarian mass, right 11/19/2021   PONV (postoperative nausea and vomiting)    Toe fracture    lt small toe   Past Surgical History:  Procedure Laterality Date   BOWEL RESECTION N/A 04/20/2022   Procedure: BOWEL RESECTION; DIVERTING ILEOSTOMY;  Surgeon: Viktoria Comer SAUNDERS, MD;  Location: [facility];  Service: Gynecology;  Laterality: N/A;   DEBULKING N/A 04/20/2022   Procedure: TUMOR DEBULKING, OMENTECTOMY;  Surgeon: Viktoria Comer SAUNDERS, MD;  Location: [facility];  Service: Gynecology;  Laterality: N/A;   HYSTERECTOMY ABDOMINAL WITH SALPINGO-OOPHORECTOMY Bilateral 04/20/2022   Procedure: HYSTERECTOMY ABDOMINAL WITH BILATERAL SALPINGO-OOPHORECTOMY;  Surgeon: Viktoria Comer SAUNDERS, MD;   Location: [facility];  Service: Gynecology;  Laterality: Bilateral;   ILEOSTOMY CLOSURE N/A 09/07/2022   Procedure: ILEOSTOMY TAKEDOWN;  Surgeon: Viktoria Comer SAUNDERS, MD;  Location: [facility];  Service: General;  Laterality: N/A;   IR IMAGING GUIDED PORT INSERTION  11/26/2021   IR PARACENTESIS  11/23/2021   IR PARACENTESIS  11/26/2021   LAPAROSCOPY N/A 04/20/2022   Procedure: LAPAROSCOPY DIAGNOSTIC,CYSTO;  Surgeon: Viktoria Comer SAUNDERS, MD;  Location: [facility];  Service: Gynecology;  Laterality: N/A;   lump removal  04/11/1993   axilla right - benign   TONSILLECTOMY     TUBAL LIGATION  04/11/1988   Patient Active Problem List   Diagnosis Date Noted   GERD (gastroesophageal reflux disease) 04/13/2023   Trigger thumb, left thumb 02/24/2023   Impingement syndrome, shoulder, left 02/24/2023   Hypomagnesemia 09/10/2022   Ileostomy in place [facility]) 09/07/2022   Ileostomy present (HCC) 09/07/2022   Lactic acidosis 05/04/2022   Hypocalcemia 04/23/2022   Ovarian cancer (HCC) 04/20/2022   Genetic testing 01/24/2022   Neutropenia (HCC) 12/15/2021   Hyponatremia 12/15/2021   Hyperkalemia 12/15/2021   AKI (acute kidney injury) (HCC) 12/15/2021   Neutropenia, drug-induced (HCC) 12/14/2021   [name], right (HCC) 12/01/2021   Goals of care, counseling/discussion 12/01/2021   Malignant ascites 11/19/2021   Abdominal pain 11/08/2021   Osteoporosis 05/05/2021   Raynaud disease 02/09/2015   Bradycardia  08/25/2009   PALPITATIONS 08/25/2009   Abnormal levels of other serum enzymes 11/13/2007   Disorder of bone and cartilage 05/22/2007   HYPERCHOLESTEROLEMIA 05/21/2007   IBS 05/21/2007   LEG PAIN, LEFT 05/21/2007    PCP: Alvan Dorothyann BIRCH, MD  REFERRING PROVIDER: Alvan Dorothyann BIRCH, MD  REFERRING DIAG: M54.2 (ICD-10-CM) - Cervical pain R51.9 (ICD-10-CM) - Frequent headaches  THERAPY DIAG:  Cervicalgia  Abnormal posture  Rationale for Evaluation and Treatment: Rehabilitation  ONSET DATE:  beginning of 2025  SUBJECTIVE:                                                                                                                                                                                                        Per eval: Reports posterior neck pain and headaches since beginning of year. States she had a lot of reactions during her time with chemo, required hospitalization and rehab stay. Developed BIL TMJ, felt neck pain and headaches came afterwards. Has had some shoulder issues for which she has seen Dr. Curtis. States her headache is typically provoked by looking up from supine position (cervical flexion, watching TV in bed) Did receive therapy for balance and walking after her hospitalizations.  Just finished up radiation. Sees oncology this week.   SUBJECTIVE STATEMENT: Patient reports her neck is stiff and mild pain with head turns; states she has a mild headache today. Patient states she was able to make an appointment with ophthalmologist today to be seen for episode with temporary vision loss in eye; states she has not had another issue with vision loss again.   PERTINENT HISTORY:  GERD, hx radiation therapy, HTN, IBS, ileostomy, neuropathy (chemo), osteoporosis, ovarian cancer, palpitations Port on RUE   PAIN:  Are you having pain: no pain, 1/10 headache L side only   Per eval:  none at rest, 6/10 with movement Location/description: middle of neck, sometimes radiated to top of head when lying down Best-worst over past week: 0-9/10  - aggravating factors: rotation, extension, lying down, looking down - Easing factors: changing positions, pillow    PRECAUTIONS: cancer, port RUE, osteoporosis  RED FLAGS: None  No N/T in UE, no recent visual changes (does endorse needing cataract surgery), no speech/swallowing issues. No balance issues.     WEIGHT BEARING RESTRICTIONS: No  FALLS:  Has patient fallen in last 6 months? 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  DIAGNOSTIC FINDINGS:  11/06/23 cervical XR: IMPRESSION: Multilevel degenerative changes of the cervical spine, most pronounced at C4-5.   Given history of malignancy, if there is a persistent clinical concern for metastatic disease, this would be better assessed with dedicated cross-sectional imaging.  PATIENT SURVEYS:  NDI: 8/50; 16%  COGNITION: Overall cognitive status: Within functional limits for tasks assessed  SENSATION: Denies sensory issues   POSTURE: fwd head, rounded shoulders, BIL UT elevation R>LR  PALPATION: TTP to BIL UT, LS, rhomboids, nonconcordant per pt report; no significant tenderness to paracervicals or suboccipitals, does have significant SCM/scalene tightness BIL   CERVICAL ROM:   ROM A/PROM (deg) eval AROM 12/04/23  Flexion 75%  100%  Extension 50%    Right lateral flexion    Left lateral flexion    Right rotation 68 deg 45 deg  Left rotation 52 deg 40 deg   (Blank rows = not tested) (Key: WFL = within functional limits not formally assessed, * = concordant pain, s = stiffness/stretching sensation, NT = not tested) Comment:  UPPER EXTREMITY ROM:  A/PROM Right eval Left eval  Shoulder flexion    Shoulder abduction    Shoulder internal rotation    Shoulder external rotation    Elbow flexion    Elbow extension    Wrist flexion    Wrist extension     (Blank rows = not tested) (Key: WFL = within functional limits not formally assessed, * = concordant pain, s = stiffness/stretching sensation, NT = not tested)  Comments: flexion symmetrical and painless BIL, L abduction limited d/t chronic  shoulder issues per pt report  UPPER EXTREMITY MMT:  MMT Right eval Left eval  Shoulder flexion    Shoulder extension 5 5  Shoulder abduction 5 5  Shoulder extension    Shoulder internal rotation    Shoulder external rotation    Elbow flexion    Elbow extension    Grip strength    (Blank rows = not tested)  (Key: WFL = within functional limits not formally assessed, * = concordant pain, s = stiffness/stretching sensation, NT = not tested)  Comments:   CERVICAL SPECIAL TESTS:  DNF endurance test 20sec w/ fatigue    [facility] Adult PT Treatment:                                                DATE: 12/14/2023 Therapeutic Exercise: Supported cervical extension with pillowcase Cervical rotation stretch with pillowcase Standing thread the needle stretch with foam roller Neuromuscular re-ed: Standing with back against noodle: Shoulder shrugs + depression Bkwd shoulder circles (shrug + scao retract) Bent arm shoulder ER Shoulder horizontal abd + yellow band Therapeutic Activity: Shoulder flexion wall slides with pillowcase SA wall slides with foam roller  Log rolling with supine to sit transfers (bed mobility)    TREATMENT DATE:  [facility] Adult PT Treatment:                                                DATE: 12/12/23 Therapeutic Exercise: Standing shoulder abduction full  arc, emphasis on postural extension 2x10  Standing w/ half foam roll; 45 deg row unresisted x10 emphasis on avoiding UT compensations Standing w/ half foam roll; W unresisted x10  HEP update + education/handout  Neuromuscular re-ed: Seated shoulder shrug x12 cues for motor control, posture, emphasis on scapular depression Seated scapular depression isometric push; (on airex pad, pushing into towels) 2x10 cues for posture and reducing tricep activation  Self Care: Education/discussion re: episode of vision loss on Saturday (see assessment for details); communication w/ PCP, monitoring for red flags and indications  for more urgent/emergent work up    [facility] Adult PT Treatment:                                                DATE: 12/07/23 Therapeutic Exercise: Supine w/ pillow support; supported cervical extension 2x12 cues for comfortable ROM and breath control Supine shoulder flexion AROM (BIL) 2x8 cues for comfortable ROM Shoulder shrug 2x12 BIL emphasis on scapular depression HEP education/discussion  Neuromuscular re-ed: Cervical retraction iso (ball at wall) 2x8 cues to avoid truncal compensations BIL scaption w/ RB pull apart 2x10, cues for posture and scapular mechanics    [facility] Adult PT Treatment:                                                DATE: 12/04/23 Therapeutic Exercise: Supine w/ pillow support; cervical extension (looking up) w/ 5sec hold; 2x8, cues for comfortable ROM, breath control Shoulder shrugs 2x10 emphasis on scapular depression, breath control HEP update + education  Neuromuscular re-ed: Standing double ER + scap retraction at wall for posture; x12 unresisted, 3x8 RB  Standing shoulder flexion AROM BIL at wall 2x8  Standing chin tuck ball at wall, x8; difficulty with controlling movement Standing chin tuck w/ deflated ball at wall, x8; improved form, same discomfort                                                                       PATIENT EDUCATION:  Education details: rationale for interventions, HEP  Person educated: Patient Education method: Explanation, Demonstration, Tactile cues, Verbal cues Education comprehension: verbalized understanding, returned demonstration, verbal cues required, tactile cues required, and needs further education     HOME EXERCISE PROGRAM: Access Code: [email or web address] URL: [email or web address] Date: 12/12/2023 Prepared by: Alm Jenny  Exercises - Supine Shoulder Flexion Extension Full Range AROM  - 2-3 x daily - 1 sets - 8-10 reps - Supine Cervical Rotation AROM on Pillow  - 2-3 x daily - 1 sets - 8-10 reps - Supine  Cervical Flexion Extension on Pillow  - 2-3 x daily - 1 sets - 8-10 reps - Standing Shoulder Shrugs  - 2-3 x daily - 1 sets - 8-10 reps - Seated Shoulder Press Ups Off Table  - 2-3 x daily - 1 sets - 8 reps  ASSESSMENT:  CLINICAL IMPRESSION: Good response with supported cervical extension and rotation stretches, providing support with use of pillowcase; patient demonstrated improved  pain-free cervical ROM after stretches. Patient had difficulty maintaining posterior shoulder girdle stabilization during shoulder flexion wall slide variations; tactile cues improved stabilization and patient's proprioceptive awareness of posture. Recommended patient try pillow wedge for supported inclined position when lying down versus stacked pillows to alleviate excessive forward head posture when watching TV lying down.  Per eval: Patient is a pleasant 74 y.o. woman who was seen today for physical therapy evaluation and treatment for neck pain + headaches ongoing since beginning of year. Tends to occur with general neck movement and with prolonged positioning in more flexed position. On exam she demonstrates concordant discomfort with cervical mobility although does not provoke headache, does endorse fatigue with DNF flexion test. Non-concordant TTP throughout postural musculature although suboccipitals/paracervicals are nontender. Does have significant scalene/SCM tightness to palpation. No adverse events, tolerates exam/HEP well overall. Recommend trial of skilled PT to address aforementioned deficits with aim of improving functional tolerance and reducing pain with typical activities. Pt departs today's session in no acute distress, all voiced concerns/questions addressed appropriately from PT perspective.      OBJECTIVE IMPAIRMENTS: decreased activity tolerance, decreased endurance, decreased ROM, impaired perceived functional ability, postural dysfunction, and pain.   ACTIVITY LIMITATIONS: sitting and  sleeping  PARTICIPATION LIMITATIONS: driving and community activity  PERSONAL FACTORS: Age, Time since onset of injury/illness/exacerbation, and 3+ comorbidities: GERD, hx radiation therapy, HTN, IBS, ileostomy, neuropathy (chemo), osteoporosis, ovarian cancer, palpitations are also affecting patient's functional outcome.   REHAB POTENTIAL: Good  CLINICAL DECISION MAKING: Evolving/moderate complexity  EVALUATION COMPLEXITY: Moderate   GOALS:   SHORT TERM GOALS: Target date: 12/19/2023  Pt will demonstrate appropriate understanding and performance of initially prescribed HEP in order to facilitate improved independence with management of symptoms.  Baseline: HEP established  Goal status: INITIAL   2. Pt will report at least 25% improvement in overall pain levels over past week in order to facilitate improved tolerance to typical daily activities.   Baseline: 0-9/10  Goal status: INITIAL    LONG TERM GOALS: Target date: 01/16/2024  Pt will score less than or equal to 5% on NDI in order to demonstrate improved perception of function due to symptoms (MDC 10-13 pts per Neysa dunker al 2009, 2010). Baseline: 16% Goal status: INITIAL  2. Pt will demonstrate at least 60 degrees of active cervical rotation ROM BIL in order to demonstrate improved environmental awareness and safety with driving.  Baseline: see ROM chart above Goal status: INITIAL  3. Pt will demonstrate at least 50% improvement in supine DNF endurance test in order to facilitate improved activity tolerance. Baseline: 20sec with nonpainful fatigue Goal status: INITIAL   4. Pt will report at least 50% reduction in frequency/intensity of headaches in order to improve tolerance to usual tasks. Baseline: 0% Goal status: INITIAL   5. Pt will demonstrate appropriate performance of final prescribed HEP in order to facilitate improved self-management of symptoms post-discharge.   Baseline: initial HEP prescribed  Goal status:  INITIAL     PLAN:  PT FREQUENCY: 2x/week  PT DURATION: 8 weeks  PLANNED INTERVENTIONS: 97164- PT Re-evaluation, 97750- Physical Performance Testing, 97110-Therapeutic exercises, 97530- Therapeutic activity, [id number]- Neuromuscular re-education, 97535- Self Care, [id number]- Manual therapy, 20560 (1-2 muscles), 20561 (3+ muscles)- Dry Needling, Patient/Family education, Taping, Joint mobilization, Spinal mobilization, Cryotherapy, and Moist heat  PLAN FOR NEXT SESSION: Check STGs next visit. Review/update HEP PRN. Would likely benefit from soft tissue work to periscapular musculature, consider incorporating SCM/scalenes as well. Work on [employer] endurance, periscapular/cervical stability, ribcage  mobility. Symptom modification strategies as indicated/appropriate. Mindful of active cancer w/ recent radiation therapy, history of osteoporosis, RUE port.    Lamarr Price, PTA 12/14/2023 12:02 PM

## 2023-12-14 NOTE — Telephone Encounter (Signed)
 Dr. Elnor  [phone] form [facility] has called to speak with one of you all, I stated that you all will call as soon as possible, please advise, thanks.

## 2023-12-15 NOTE — Telephone Encounter (Signed)
 LVM asking for RTC

## 2023-12-15 NOTE — Telephone Encounter (Signed)
 Called Dr. Freeman ofc. He will not be in the office until 12pm. Will need to call back at that time.

## 2023-12-15 NOTE — Telephone Encounter (Signed)
 Dr. Elnor from [facility] has called office again this morning concerning mutual patient, Dr. Elnor would like a call back on his cell [phone].

## 2023-12-18 ENCOUNTER — Telehealth: Payer: Self-pay

## 2023-12-18 ENCOUNTER — Ambulatory Visit

## 2023-12-18 DIAGNOSIS — M542 Cervicalgia: Secondary | ICD-10-CM

## 2023-12-18 DIAGNOSIS — R293 Abnormal posture: Secondary | ICD-10-CM | POA: Diagnosis not present

## 2023-12-18 NOTE — Telephone Encounter (Signed)
 Orders Placed This Encounter  Procedures   MR Brain W Wo Contrast    Standing Status:   Future    Expiration Date:   12/17/2024    If indicated for the ordered procedure, I authorize the administration of contrast media per Radiology protocol:   Yes    What is the patient's sedation requirement?:   No Sedation    Does the patient have a pacemaker or implanted devices?:   No    Preferred imaging location?:   [facility] (table limit 500lbs)

## 2023-12-18 NOTE — Therapy (Signed)
 OUTPATIENT PHYSICAL THERAPY TREATMENT   Patient Name: Maria Romero MRN: [id number] DOB:10/03/1950, 73 y.o., female Today's Date: 12/18/2023  END OF SESSION:  PT End of Session - 12/18/23 1001     Visit Number 8    Number of Visits 17    Date for PT Re-Evaluation 01/16/24    Authorization Type [employer] MDC    Authorization Time Period 16 visits approved for PT 11/21/2023-01/16/2024    Authorization - Visit Number 8    Authorization - Number of Visits 16    Progress Note Due on Visit 10    PT Start Time 1013    PT Stop Time 1100    PT Time Calculation (min) 47 min    Behavior During Therapy [facility] for tasks assessed/performed         Past Medical History:  Diagnosis Date   Anemia    Complication of anesthesia    Dyspnea    GERD (gastroesophageal reflux disease)    Goals of care, counseling/discussion 12/01/2021   History of radiation therapy    Abdomen-10/02/23-10/16/23- [name]   Hypercholesterolemia    Hypertension    IBS (irritable bowel syndrome)    Ileostomy in place [facility])    Malignant ascites 11/19/2021   Neuromuscular disorder (HCC)    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   Osteopenia    Osteoporosis    [name], right (HCC) 12/01/2021   Ovarian mass, right 11/19/2021   PONV (postoperative nausea and vomiting)    Toe fracture    lt small toe   Past Surgical History:  Procedure Laterality Date   BOWEL RESECTION N/A 04/20/2022   Procedure: BOWEL RESECTION; DIVERTING ILEOSTOMY;  Surgeon: Viktoria Comer SAUNDERS, MD;  Location: [facility];  Service: Gynecology;  Laterality: N/A;   DEBULKING N/A 04/20/2022   Procedure: TUMOR DEBULKING, OMENTECTOMY;  Surgeon: Viktoria Comer SAUNDERS, MD;  Location: [facility];  Service: Gynecology;  Laterality: N/A;   HYSTERECTOMY ABDOMINAL WITH SALPINGO-OOPHORECTOMY Bilateral 04/20/2022   Procedure: HYSTERECTOMY ABDOMINAL WITH BILATERAL SALPINGO-OOPHORECTOMY;  Surgeon: Viktoria Comer SAUNDERS, MD;  Location: [facility];  Service: Gynecology;  Laterality:  Bilateral;   ILEOSTOMY CLOSURE N/A 09/07/2022   Procedure: ILEOSTOMY TAKEDOWN;  Surgeon: Viktoria Comer SAUNDERS, MD;  Location: [facility];  Service: General;  Laterality: N/A;   IR IMAGING GUIDED PORT INSERTION  11/26/2021   IR PARACENTESIS  11/23/2021   IR PARACENTESIS  11/26/2021   LAPAROSCOPY N/A 04/20/2022   Procedure: LAPAROSCOPY DIAGNOSTIC,CYSTO;  Surgeon: Viktoria Comer SAUNDERS, MD;  Location: [facility];  Service: Gynecology;  Laterality: N/A;   lump removal  04/11/1993   axilla right - benign   TONSILLECTOMY     TUBAL LIGATION  04/11/1988   Patient Active Problem List   Diagnosis Date Noted   GERD (gastroesophageal reflux disease) 04/13/2023   Trigger thumb, left thumb 02/24/2023   Impingement syndrome, shoulder, left 02/24/2023   Hypomagnesemia 09/10/2022   Ileostomy in place [facility]) 09/07/2022   Ileostomy present (HCC) 09/07/2022   Lactic acidosis 05/04/2022   Hypocalcemia 04/23/2022   Ovarian cancer (HCC) 04/20/2022   Genetic testing 01/24/2022   Neutropenia (HCC) 12/15/2021   Hyponatremia 12/15/2021   Hyperkalemia 12/15/2021   AKI (acute kidney injury) (HCC) 12/15/2021   Neutropenia, drug-induced (HCC) 12/14/2021   [name], right (HCC) 12/01/2021   Goals of care, counseling/discussion 12/01/2021   Malignant ascites 11/19/2021   Abdominal pain 11/08/2021   Osteoporosis 05/05/2021   Raynaud disease 02/09/2015   Bradycardia 08/25/2009   PALPITATIONS 08/25/2009   Abnormal levels  of other serum enzymes 11/13/2007   Disorder of bone and cartilage 05/22/2007   HYPERCHOLESTEROLEMIA 05/21/2007   IBS 05/21/2007   LEG PAIN, LEFT 05/21/2007    PCP: Alvan Dorothyann BIRCH, MD  REFERRING PROVIDER: Alvan Dorothyann BIRCH, MD  REFERRING DIAG: M54.2 (ICD-10-CM) - Cervical pain R51.9 (ICD-10-CM) - Frequent headaches  THERAPY DIAG:  Cervicalgia  Abnormal posture  Rationale for Evaluation and Treatment: Rehabilitation  ONSET DATE: beginning of 2025  SUBJECTIVE:                                                                                                                                                                                                         Per eval: Reports posterior neck pain and headaches since beginning of year. States she had a lot of reactions during her time with chemo, required hospitalization and rehab stay. Developed BIL TMJ, felt neck pain and headaches came afterwards. Has had some shoulder issues for which she has seen Dr. Curtis. States her headache is typically provoked by looking up from supine position (cervical flexion, watching TV in bed) Did receive therapy for balance and walking after her hospitalizations.  Just finished up radiation. Sees oncology this week.   SUBJECTIVE STATEMENT: Patient reports she saw Dr. Adeline (ophthalmologist) and he thinks her vision loss was due to a possible TIA; states he will reach out to her PCP Dr. Alvan. Patient states she has not had any vision loss/TIA symptoms since last PT visit. Patient states neck pain is less frequent and severe than compared to start of PT; patient she continues to hear creakiness when turning head side to side with some pain and pulling; states pain in neck is more painful when nodding head or moving from supine to sit.    PERTINENT HISTORY:  GERD, hx radiation therapy, HTN, IBS, ileostomy, neuropathy (chemo), osteoporosis, ovarian cancer, palpitations Port on RUE   PAIN:  Are you having pain: no pain, 4/10 headache L side only   Per eval:  none at rest, 6/10 with movement Location/description: middle of neck, sometimes radiated to top of head when lying down Best-worst over past week: 0-9/10  - aggravating factors: rotation, extension, lying down, looking down - Easing factors: changing positions, pillow    PRECAUTIONS: cancer, port RUE, osteoporosis  RED FLAGS: None  No N/T in UE, no recent visual changes (does endorse needing cataract surgery), no  speech/swallowing issues. No balance issues.     WEIGHT BEARING RESTRICTIONS: No  FALLS:  Has patient fallen in last 6 months? 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  DIAGNOSTIC FINDINGS:  11/06/23 cervical XR: IMPRESSION: Multilevel degenerative changes of the cervical spine, most pronounced at C4-5.   Given history of malignancy, if there is a persistent clinical concern for metastatic disease, this would be better assessed with dedicated cross-sectional imaging.  PATIENT SURVEYS:  NDI: 8/50; 16%  COGNITION: Overall cognitive status: Within functional limits for tasks assessed  SENSATION: Denies sensory issues   POSTURE: fwd head, rounded shoulders, BIL UT elevation R>LR  PALPATION: TTP to BIL UT, LS, rhomboids, nonconcordant per pt report; no significant tenderness to paracervicals or suboccipitals, does have significant SCM/scalene tightness BIL   CERVICAL ROM:   ROM A/PROM (deg) eval AROM 12/04/23 AROM 12/18/23  Flexion 75%  100%   Extension 50%     Right lateral flexion     Left lateral flexion     Right rotation 68 deg 45 deg 65 deg  Left rotation 52 deg 40 deg 55 deg   (Blank rows = not tested) (Key: WFL = within functional limits not formally assessed, * = concordant pain, s = stiffness/stretching sensation, NT = not tested) Comment:  UPPER EXTREMITY ROM:  A/PROM Right eval Left eval  Shoulder flexion    Shoulder abduction    Shoulder internal rotation    Shoulder external rotation    Elbow flexion    Elbow extension    Wrist flexion    Wrist extension     (Blank rows = not tested) (Key: WFL = within  functional limits not formally assessed, * = concordant pain, s = stiffness/stretching sensation, NT = not tested)  Comments: flexion symmetrical and painless BIL, L abduction limited d/t chronic shoulder issues per pt report  UPPER EXTREMITY MMT:  MMT Right eval Left eval  Shoulder flexion    Shoulder extension 5 5  Shoulder abduction 5 5  Shoulder extension    Shoulder internal rotation    Shoulder external rotation    Elbow flexion    Elbow extension    Grip strength    (Blank rows = not tested)  (Key: WFL = within functional limits not formally assessed, * = concordant pain, s = stiffness/stretching sensation, NT = not tested)  Comments:   CERVICAL SPECIAL TESTS:  DNF endurance test 20sec w/ fatigue    [facility] Adult PT Treatment:                                                DATE: 12/18/2023 Therapeutic Exercise: Neuromuscular re-ed: DNF endurance lift -off hold x4 --> 20-30 sec Cervical extension SNAG with pillowcase Wall clock + yellow TB SA wall slides + yellow TB around wrists Therapeutic Activity: Cervical rotation (measurements see above) Cervical AROM gentle mobility with large noodle --> supine & side lying (melt) Thoracic extension over noodle --> too much pull on port Supine on large noodle:  Scissor arms Arm circles Hug / T arms Self Care: Subjective intake on ophthalmologist appt  BP reading --> 119/64 mmHg   [facility] Adult PT Treatment:  DATE: 12/14/2023 Therapeutic Exercise: Supported cervical extension with pillowcase Cervical rotation stretch with pillowcase Standing thread the needle stretch with foam roller Neuromuscular re-ed: Standing with back against noodle: Shoulder shrugs + depression Bkwd shoulder circles (shrug + scao retract) Bent arm shoulder ER Shoulder horizontal abd + yellow band Therapeutic Activity: Shoulder flexion wall slides with pillowcase SA wall slides with foam roller  Log  rolling with supine to sit transfers (bed mobility)    TREATMENT DATE:  [facility] Adult PT Treatment:                                                DATE: 12/12/23 Therapeutic Exercise: Standing shoulder abduction full arc, emphasis on postural extension 2x10  Standing w/ half foam roll; 45 deg row unresisted x10 emphasis on avoiding UT compensations Standing w/ half foam roll; W unresisted x10  HEP update + education/handout  Neuromuscular re-ed: Seated shoulder shrug x12 cues for motor control, posture, emphasis on scapular depression Seated scapular depression isometric push; (on airex pad, pushing into towels) 2x10 cues for posture and reducing tricep activation  Self Care: Education/discussion re: episode of vision loss on Saturday (see assessment for details); communication w/ PCP, monitoring for red flags and indications for more urgent/emergent work up                                                                        PATIENT EDUCATION:  Education details: rationale for interventions, HEP  Person educated: Patient Education method: Programmer, multimedia, Facilities manager, Actor cues, Verbal cues Education comprehension: verbalized understanding, returned demonstration, verbal cues required, tactile cues required, and needs further education     HOME EXERCISE PROGRAM: Access Code: [email or web address] URL: [email or web address] Date: 12/12/2023 Prepared by: Alm Jenny  Exercises - Supine Shoulder Flexion Extension Full Range AROM  - 2-3 x daily - 1 sets - 8-10 reps - Supine Cervical Rotation AROM on Pillow  - 2-3 x daily - 1 sets - 8-10 reps - Supine Cervical Flexion Extension on Pillow  - 2-3 x daily - 1 sets - 8-10 reps - Standing Shoulder Shrugs  - 2-3 x daily - 1 sets - 8-10 reps - Seated Shoulder Press Ups Off Table  - 2-3 x daily - 1 sets - 8 reps  ASSESSMENT:  CLINICAL IMPRESSION: Per subjective intake, vitals taken and patient's BP in normal range. Cervical rotation  has improved since last measurement; patient continues to have increased pain at base of Lt neck with head rotation. Improved DNF strength noted with repetition with minimal pain. Recommended patient follow up with PCP on ophthalmology appointment.  Per eval: Patient is a pleasant 73 y.o. woman who was seen today for physical therapy evaluation and treatment for neck pain + headaches ongoing since beginning of year. Tends to occur with general neck movement and with prolonged positioning in more flexed position. On exam she demonstrates concordant discomfort with cervical mobility although does not provoke headache, does endorse fatigue with DNF flexion test. Non-concordant TTP throughout postural musculature although suboccipitals/paracervicals are nontender. Does have significant scalene/SCM tightness to palpation. No adverse events, tolerates exam/HEP well overall.  Recommend trial of skilled PT to address aforementioned deficits with aim of improving functional tolerance and reducing pain with typical activities. Pt departs today's session in no acute distress, all voiced concerns/questions addressed appropriately from PT perspective.      OBJECTIVE IMPAIRMENTS: decreased activity tolerance, decreased endurance, decreased ROM, impaired perceived functional ability, postural dysfunction, and pain.   ACTIVITY LIMITATIONS: sitting and sleeping  PARTICIPATION LIMITATIONS: driving and community activity  PERSONAL FACTORS: Age, Time since onset of injury/illness/exacerbation, and 3+ comorbidities: GERD, hx radiation therapy, HTN, IBS, ileostomy, neuropathy (chemo), osteoporosis, ovarian cancer, palpitations are also affecting patient's functional outcome.   REHAB POTENTIAL: Good  CLINICAL DECISION MAKING: Evolving/moderate complexity  EVALUATION COMPLEXITY: Moderate   GOALS:   SHORT TERM GOALS: Target date: 12/19/2023  Pt will demonstrate appropriate understanding and performance of initially  prescribed HEP in order to facilitate improved independence with management of symptoms.  Baseline: HEP established  Goal status: MET   2. Pt will report at least 25% improvement in overall pain levels over past week in order to facilitate improved tolerance to typical daily activities.   Baseline: 0-9/10 12/18/23: 10% improvement   Goal status: NOT MET  LONG TERM GOALS: Target date: 01/16/2024  Pt will score less than or equal to 5% on NDI in order to demonstrate improved perception of function due to symptoms (MDC 10-13 pts per Neysa dunker al 2009, 2010). Baseline: 16% Goal status: INITIAL  2. Pt will demonstrate at least 60 degrees of active cervical rotation ROM BIL in order to demonstrate improved environmental awareness and safety with driving.  Baseline: see ROM chart above 12/18/23: Lt 55 deg (see above) Goal status: IN PROGRESS  3. Pt will demonstrate at least 50% improvement in supine DNF endurance test in order to facilitate improved activity tolerance. Baseline: 20sec with nonpainful fatigue 12/18/23: 30 sec no pain, mild fatigue Goal status: MET  4. Pt will report at least 50% reduction in frequency/intensity of headaches in order to improve tolerance to usual tasks. Baseline: 0% 12/18/23: 30% Goal status: IN PROGRESS  5. Pt will demonstrate appropriate performance of final prescribed HEP in order to facilitate improved self-management of symptoms post-discharge.   Baseline: initial HEP prescribed  Goal status: INITIAL     PLAN:  PT FREQUENCY: 2x/week  PT DURATION: 8 weeks  PLANNED INTERVENTIONS: 97164- PT Re-evaluation, 97750- Physical Performance Testing, 97110-Therapeutic exercises, 97530- Therapeutic activity, [id number]- Neuromuscular re-education, 97535- Self Care, [id number]- Manual therapy, 20560 (1-2 muscles), 20561 (3+ muscles)- Dry Needling, Patient/Family education, Taping, Joint mobilization, Spinal mobilization, Cryotherapy, and Moist heat  PLAN FOR NEXT SESSION:  Review/update HEP PRN. Would likely benefit from soft tissue work to periscapular musculature, consider incorporating SCM/scalenes as well. Work on [employer] endurance, periscapular/cervical stability, ribcage mobility. Symptom modification strategies as indicated/appropriate. Mindful of active cancer w/ recent radiation therapy, history of osteoporosis, RUE port.    Lamarr Price, PTA 12/18/2023 11:01 AM

## 2023-12-18 NOTE — Telephone Encounter (Signed)
 Patient stopped by the office. States she has seen ophthalmologist Dr. Elnor on Friday 12/15/2023 and he was going to speak with DR. Metheney regarding his findings.  Per DR. Metheney- she did speak with DR. Murdock who is concerned that patient has possibly had a TIA .  Per DR. Metheney - we have scheduled patient for a visit with Dr. Alvan on Wednesday 09//01/2024 and Dr. Alvan will work on getting a Brain MRI Ordered for her.

## 2023-12-20 ENCOUNTER — Telehealth: Payer: Self-pay | Admitting: Family Medicine

## 2023-12-20 ENCOUNTER — Encounter: Payer: Self-pay | Admitting: Family Medicine

## 2023-12-20 ENCOUNTER — Ambulatory Visit (INDEPENDENT_AMBULATORY_CARE_PROVIDER_SITE_OTHER): Admitting: Family Medicine

## 2023-12-20 VITALS — BP 115/61 | HR 62 | Resp 20 | Ht 63.0 in | Wt 110.0 lb

## 2023-12-20 DIAGNOSIS — G453 Amaurosis fugax: Secondary | ICD-10-CM | POA: Diagnosis not present

## 2023-12-20 NOTE — Progress Notes (Signed)
 Established Patient Office Visit  Subjective  Patient ID: ALLEXA ACOFF, female    DOB: [birth year]  Age: 73 y.o. MRN: [id number]  Chief Complaint  Patient presents with   Loss of Vision    Sunday night 12/10/2023 No dizziness No falls    HPI  Discussed the use of AI scribe software for clinical note transcription with the patient, who gave verbal consent to proceed.  History of Present Illness IYANNAH BLAKE is a 73 year old female with metastatic cancer who presents with transient vision loss in the left eye.  Transient monocular vision loss and neurological symptoms - Sudden loss of vision in the left eye approximately two weeks ago, described as a 'curtain effect' with complete vision loss that gradually returned over 15 minutes - No associated symptoms during the episode. No facial numbness, able to stand and talk without difficulty   Metastatic malignancy - History of metastatic cancer - Previously on baby aspirin for years, discontinued two years ago due to cancer treatment  Raynaud phenomenon - Fingers and toes turn white in cold weather annually - Currently taking amlodipine , initially prescribed for this condition  Please see note from Dr. Elnor, ophthalmology, in the media tab.     ROS    Objective:     BP 115/61   Pulse 62   Resp 20   Ht 5' 3 (1.6 m)   Wt 110 lb (49.9 kg)   SpO2 100%   BMI 19.49 kg/m    Physical Exam Vitals reviewed.  Constitutional:      Appearance: Normal appearance.  HENT:     Head: Normocephalic.  Pulmonary:     Effort: Pulmonary effort is normal.  Neurological:     Mental Status: She is alert and oriented to person, place, and time.  Psychiatric:        Mood and Affect: Mood normal.        Behavior: Behavior normal.      No results found for any visits on 12/20/23.    The 10-year ASCVD risk score (Arnett DK, et al., 2019) is: 15.1%    Assessment & Plan:   Problem List Items Addressed This Visit    None Visit Diagnoses       Amaurosis fugax    -  Primary   Relevant Orders   VAS US  CAROTID   ECHOCARDIOGRAM COMPLETE      Assessment and Plan Assessment & Plan Amaurosis fugax (transient monocular vision loss) - left eye  Recent transient monocular vision loss suggests possible TIA or embolic event. Family history of strokes increases risk. Discussed preventive measures and risks of recurrence. Awaiting oncologist's input on aspirin therapy due to cancer history. - Order MRI of the brain to assess for prior strokes and brain health. - Order carotid Doppler ultrasound to evaluate carotid artery plaque. - Consult oncologist regarding baby aspirin therapy safety. - Consider starting baby aspirin pending oncologist's input. - Discussed potential need for blood thinners if aspirin unsuitable. - Did confirm with Dr. Timmy, hematology oncology they felt comfortable with restarting her baby aspirin.  Metastatic cancer, under active management Metastatic cancer stable for two years, under active management. CT scan scheduled to assess response to recent radiation therapy. - Continue current cancer management plan. - Proceed with scheduled CT scan on September 29 to evaluate radiation therapy effect.  Bilateral cataracts, planned for surgery Bilateral cataracts causing significant visual impairment, right eye more affected. Surgery planned for vision improvement. - Proceed with consultation  for cataract surgery in October. - Plan for sequential cataract surgeries, starting with the more affected eye.   No follow-ups on file.    Dorothyann Byars, MD

## 2023-12-20 NOTE — Telephone Encounter (Signed)
 Note sent in regards to starting baby aspirin

## 2023-12-21 ENCOUNTER — Ambulatory Visit: Admitting: Physical Therapy

## 2023-12-21 ENCOUNTER — Encounter: Payer: Self-pay | Admitting: Physical Therapy

## 2023-12-21 DIAGNOSIS — M542 Cervicalgia: Secondary | ICD-10-CM

## 2023-12-21 DIAGNOSIS — R293 Abnormal posture: Secondary | ICD-10-CM

## 2023-12-21 NOTE — Therapy (Signed)
 OUTPATIENT PHYSICAL THERAPY TREATMENT + DISCHARGE    Patient Name: Maria Romero MRN: [id number] DOB:May 24, 1950, 73 y.o., female Today's Date: 12/21/2023   PHYSICAL THERAPY DISCHARGE SUMMARY  Visits from Start of Care: 9  Current functional level related to goals / functional outcomes: Able to perform majority of usual activities with pain   Remaining deficits: Pain, stiffness   Education / Equipment: HEP, discharge education, self care education as below, follow up with providers   Patient agrees to discharge. Patient goals were partially met. Patient is being discharged due to pt preference given ongoing work up for episode of transient vision loss.   END OF SESSION:  PT End of Session - 12/21/23 1102     Visit Number 9    Number of Visits 17    Date for PT Re-Evaluation 01/16/24    Authorization Type [employer] MDC    Authorization Time Period 16 visits approved for PT 11/21/2023-01/16/2024    Authorization - Visit Number 9    Authorization - Number of Visits 16    Progress Note Due on Visit 10    PT Start Time 1103    PT Stop Time 1142    PT Time Calculation (min) 39 min          Past Medical History:  Diagnosis Date   Anemia    Complication of anesthesia    Dyspnea    GERD (gastroesophageal reflux disease)    Goals of care, counseling/discussion 12/01/2021   History of radiation therapy    Abdomen-10/02/23-10/16/23- [facility] Kinard   Hypercholesterolemia    Hypertension    IBS (irritable bowel syndrome)    Ileostomy in place [facility])    Malignant ascites 11/19/2021   Neuromuscular disorder (HCC)    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   Osteopenia    Osteoporosis    [name], right (HCC) 12/01/2021   Ovarian mass, right 11/19/2021   PONV (postoperative nausea and vomiting)    Toe fracture    lt small toe   Past Surgical History:  Procedure Laterality Date   BOWEL RESECTION N/A 04/20/2022   Procedure: BOWEL RESECTION; DIVERTING ILEOSTOMY;  Surgeon: Viktoria Comer SAUNDERS, MD;  Location: [facility];  Service: Gynecology;  Laterality: N/A;   DEBULKING N/A 04/20/2022   Procedure: TUMOR DEBULKING, OMENTECTOMY;  Surgeon: Viktoria Comer SAUNDERS, MD;  Location: [facility];  Service: Gynecology;  Laterality: N/A;   HYSTERECTOMY ABDOMINAL WITH SALPINGO-OOPHORECTOMY Bilateral 04/20/2022   Procedure: HYSTERECTOMY ABDOMINAL WITH BILATERAL SALPINGO-OOPHORECTOMY;  Surgeon: Viktoria Comer SAUNDERS, MD;  Location: [facility];  Service: Gynecology;  Laterality: Bilateral;   ILEOSTOMY CLOSURE N/A 09/07/2022   Procedure: ILEOSTOMY TAKEDOWN;  Surgeon: Viktoria Comer SAUNDERS, MD;  Location: [facility];  Service: General;  Laterality: N/A;   IR IMAGING GUIDED PORT INSERTION  11/26/2021   IR PARACENTESIS  11/23/2021   IR PARACENTESIS  11/26/2021   LAPAROSCOPY N/A 04/20/2022   Procedure: LAPAROSCOPY DIAGNOSTIC,CYSTO;  Surgeon: Viktoria Comer SAUNDERS, MD;  Location: [facility];  Service: Gynecology;  Laterality: N/A;   lump removal  04/11/1993   axilla right - benign   TONSILLECTOMY     TUBAL LIGATION  04/11/1988   Patient Active Problem List   Diagnosis Date Noted   GERD (gastroesophageal reflux disease) 04/13/2023   Trigger thumb, left thumb 02/24/2023   Impingement syndrome, shoulder, left 02/24/2023   Hypomagnesemia 09/10/2022   Ileostomy in place [facility]) 09/07/2022   Ileostomy present (HCC) 09/07/2022   Lactic acidosis 05/04/2022   Hypocalcemia 04/23/2022   Ovarian cancer (  HCC) 04/20/2022   Genetic testing 01/24/2022   Neutropenia (HCC) 12/15/2021   Hyponatremia 12/15/2021   Hyperkalemia 12/15/2021   AKI (acute kidney injury) (HCC) 12/15/2021   Neutropenia, drug-induced (HCC) 12/14/2021   [name], right (HCC) 12/01/2021   Goals of care, counseling/discussion 12/01/2021   Malignant ascites 11/19/2021   Abdominal pain 11/08/2021   Osteoporosis 05/05/2021   Raynaud disease 02/09/2015   Bradycardia 08/25/2009   PALPITATIONS 08/25/2009   Abnormal levels of other serum enzymes 11/13/2007    Disorder of bone and cartilage 05/22/2007   HYPERCHOLESTEROLEMIA 05/21/2007   IBS 05/21/2007   LEG PAIN, LEFT 05/21/2007    PCP: Alvan Dorothyann BIRCH, MD  REFERRING PROVIDER: Alvan Dorothyann BIRCH, MD  REFERRING DIAG: M54.2 (ICD-10-CM) - Cervical pain R51.9 (ICD-10-CM) - Frequent headaches  THERAPY DIAG:  Cervicalgia  Abnormal posture  Rationale for Evaluation and Treatment: Rehabilitation  ONSET DATE: beginning of 2025  SUBJECTIVE:                                                                                                                                                                                                        Per eval: Reports posterior neck pain and headaches since beginning of year. States she had a lot of reactions during her time with chemo, required hospitalization and rehab stay. Developed BIL TMJ, felt neck pain and headaches came afterwards. Has had some shoulder issues for which she has seen Dr. Curtis. States her headache is typically provoked by looking up from supine position (cervical flexion, watching TV in bed) Did receive therapy for balance and walking after her hospitalizations.  Just finished up radiation. Sees oncology this week.   SUBJECTIVE STATEMENT: 12/21/2023: Pt arrives w/ report of modest improvement in symptoms since start of care, tendency for improved ROM and reduced headache w/ exercises. She states she would like to discharge from PT today as she has ongoing work up for her transient vision loss and would like to focus on that at this point.     PERTINENT HISTORY:  GERD, hx radiation therapy, HTN, IBS, ileostomy, neuropathy (chemo), osteoporosis, ovarian cancer, palpitations Port on RUE   PAIN:  Are you having pain: no resting pain, L sided headache with rotation to L 1/10 ; worst 7/10 in past week  Per eval:  none at rest, 6/10 with movement Location/description: middle of neck, sometimes radiated to top of head when  lying down Best-worst over past week: 0-9/10  - aggravating factors: rotation, extension, lying down, looking down - Easing factors: changing positions, pillow    PRECAUTIONS:  cancer, port RUE, osteoporosis  RED FLAGS: None  No N/T in UE, no recent visual changes (does endorse needing cataract surgery), no speech/swallowing issues. No balance issues.     WEIGHT BEARING RESTRICTIONS: No  FALLS:  Has patient fallen in last 6 months? 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  DIAGNOSTIC FINDINGS:  11/06/23 cervical XR: IMPRESSION: Multilevel degenerative changes of the cervical spine, most pronounced at C4-5.   Given history of malignancy, if there is a persistent clinical concern for metastatic disease, this would be better assessed with dedicated cross-sectional imaging.  PATIENT SURVEYS:  NDI: 8/50; 16%  12/21/23 NDI 9/50 18%  COGNITION: Overall cognitive status: Within functional limits for tasks assessed  SENSATION: Denies sensory issues   POSTURE: fwd head, rounded shoulders, BIL UT elevation R>LR  PALPATION: TTP to BIL UT, LS, rhomboids, nonconcordant per pt report; no significant tenderness to paracervicals or suboccipitals, does have significant SCM/scalene tightness BIL   CERVICAL ROM:   ROM A/PROM (deg) eval AROM 12/04/23 AROM 12/18/23 AROM 12/21/23  Flexion 75%  100%    Extension 50%      Right lateral flexion      Left lateral flexion      Right rotation 68 deg 45 deg 65 deg 55 deg pre session  62 post exercise  Left rotation 52 deg 40 deg 55 deg 47 deg pre session  56 deg post exercise   (Blank rows = not  tested) (Key: WFL = within functional limits not formally assessed, * = concordant pain, s = stiffness/stretching sensation, NT = not tested) Comment:  UPPER EXTREMITY ROM:  A/PROM Right eval Left eval  Shoulder flexion    Shoulder abduction    Shoulder internal rotation    Shoulder external rotation    Elbow flexion    Elbow extension    Wrist flexion    Wrist extension     (Blank rows = not tested) (Key: WFL = within functional limits not formally assessed, * = concordant pain, s = stiffness/stretching sensation, NT = not tested)  Comments: flexion symmetrical and painless BIL, L abduction limited d/t chronic shoulder issues per pt report  UPPER EXTREMITY MMT:  MMT Right eval Left eval  Shoulder flexion    Shoulder extension 5 5  Shoulder abduction 5 5  Shoulder extension    Shoulder internal rotation    Shoulder external rotation    Elbow flexion    Elbow extension    Grip strength    (Blank rows = not tested)  (Key: WFL = within functional limits not formally assessed, * = concordant pain, s = stiffness/stretching sensation, NT = not tested)  Comments:   CERVICAL SPECIAL TESTS:  DNF endurance test 20sec w/ fatigue    [facility] Adult PT Treatment:                                                DATE: 12/21/23 Therapeutic Exercise: Scapular depression iso x10 Shoulder shrugs x10 Supine shoulder flexion x10 Supine cervical rotation x10  BIL  HEP handout + education  Therapeutic Activity: MSK assessment + education Education/discussion re: progress with PT, symptom behavior as it affects activity tolerance, PT goals/POC, discharge education, follow up with providers  Self Care: Education/discussion re: monitoring symptoms, red flags and indications for emergent work up, continued communication w/ providers, activity modification, positional modifications                            PATIENT EDUCATION:  Education details: PT POC, PT goals, progress with PT thus far,  discharge planning, HEP, follow up with provider  Person educated: Patient Education method: Explanation, Demonstration, Verbal cues Education comprehension: verbalized understanding, returned demonstration    HOME EXERCISE PROGRAM: Access Code: [email or web address] URL: [email or web address] Date: 12/21/2023 Prepared by: Alm Jenny  Exercises - Supine Shoulder Flexion Extension Full Range AROM  - 2-3 x daily - 1 sets - 8-10 reps - Supine Cervical Rotation AROM on Pillow  - 2-3 x daily - 1 sets - 8-10 reps - Standing Shoulder Shrugs  - 2-3 x daily - 1 sets - 8-10 reps - Seated Shoulder Press Ups Off Table  - 2-3 x daily - 1 sets - 8 reps  ASSESSMENT:  CLINICAL IMPRESSION: 12/21/2023: Pt arrives w/ report of ongoing workup for her transient vision loss, no recurrence of symptoms. Cleared to continue w/ PT from PCP via secure chat; however, pt reports for now she would like to finish PT and work on independent HEP while work up is ongoing, which is respected. She demonstrates equivocal changes in cervical ROM compared to start of care although it is observed to improve within session after HEP. Modest improvement in symptoms overall. HEP review/practice with good response. No adverse events. Reinforced monitoring of symptoms, red flags and indications for emergent work up, ongoing communication w/ providers. Pt verbalizes agreement/understanding w/ plan to d/c to independent HEP at this time. Pt departs today's session in no acute distress, all voiced questions/concerns addressed appropriately from PT perspective.     Per eval: Patient is a pleasant 73 y.o. woman who was seen today for physical therapy evaluation and treatment for neck pain + headaches ongoing since beginning of year. Tends to occur with general neck movement and with prolonged positioning in more flexed position. On exam she demonstrates concordant discomfort with cervical mobility although does not provoke headache, does  endorse fatigue with DNF flexion test. Non-concordant TTP throughout postural musculature although suboccipitals/paracervicals are nontender. Does have significant scalene/SCM tightness to palpation. No adverse events, tolerates exam/HEP well overall. Recommend trial of skilled PT to address aforementioned deficits with aim of improving functional tolerance and reducing pain with typical activities. Pt departs today's session in no acute distress, all voiced concerns/questions addressed appropriately from PT perspective.      OBJECTIVE IMPAIRMENTS: decreased activity tolerance, decreased endurance, decreased ROM, impaired perceived functional ability, postural dysfunction, and pain.   ACTIVITY LIMITATIONS: sitting and sleeping  PARTICIPATION LIMITATIONS: driving and community activity  PERSONAL FACTORS: Age, Time since onset of injury/illness/exacerbation, and 3+ comorbidities: GERD, hx radiation therapy, HTN, IBS, ileostomy, neuropathy (chemo), osteoporosis, ovarian cancer, palpitations are also affecting patient's functional outcome.   REHAB POTENTIAL: Good  CLINICAL DECISION MAKING: Evolving/moderate complexity  EVALUATION COMPLEXITY: Moderate   GOALS:   SHORT TERM GOALS: Target date: 12/19/2023  Pt will demonstrate appropriate understanding and performance of initially prescribed HEP in order to facilitate improved independence with management of symptoms.  Baseline: HEP established  Goal status: MET  2. Pt will report at least 25% improvement in overall pain levels over past week in order to facilitate improved tolerance to typical daily activities.   Baseline: 0-9/10 12/18/23: 10% improvement   Goal status: NOT MET  LONG TERM GOALS: Target date: 01/16/2024  Pt will score less than or equal to 5% on NDI in order to demonstrate improved perception of function due to symptoms (MDC 10-13 pts per Neysa dunker al 2009, 2010). Baseline: 16% 12/21/23: 18% Goal status: NOT MET  2. Pt will  demonstrate at least 60 degrees of active cervical rotation ROM BIL in order to demonstrate improved environmental awareness and safety with driving.  Baseline: see ROM chart above 12/18/23: Lt 55 deg (see above) 12/21/23: see ROM chart above Goal status: PARTIALLY MET   3. Pt will demonstrate at least 50% improvement in supine DNF endurance test in order to facilitate improved activity tolerance. Baseline: 20sec with nonpainful fatigue 12/18/23: 30 sec no pain, mild fatigue Goal status: MET  4. Pt will report at least 50% reduction in frequency/intensity of headaches in order to improve tolerance to usual tasks. Baseline: 0% 12/18/23: 30% 12/21/23: reports 15% improvement in frequency/intensity of headaches Goal status: NOT MET  5. Pt will demonstrate appropriate performance of final prescribed HEP in order to facilitate improved self-management of symptoms post-discharge.   Baseline: initial HEP prescribed  12/21/23: good HEP performance/understanding  Goal status: MET    PLAN: DISCHARGE 12/21/23    Alm DELENA Jenny PT, DPT 12/21/2023 12:48 PM

## 2023-12-25 ENCOUNTER — Ambulatory Visit

## 2023-12-26 ENCOUNTER — Ambulatory Visit (HOSPITAL_BASED_OUTPATIENT_CLINIC_OR_DEPARTMENT_OTHER)
Admission: RE | Admit: 2023-12-26 | Discharge: 2023-12-26 | Disposition: A | Discharge status: Home / Self Care | Source: Ambulatory Visit | Attending: Family Medicine | Admitting: Family Medicine

## 2023-12-26 DIAGNOSIS — G453 Amaurosis fugax: Secondary | ICD-10-CM | POA: Insufficient documentation

## 2023-12-26 MED ORDER — GADOBUTROL 1 MMOL/ML IV SOLN
4.9000 mL | Freq: Once | INTRAVENOUS | Status: AC | PRN
Start: 2023-12-26 — End: 2023-12-26
  Administered 2023-12-26: 4.9 mL via INTRAVENOUS

## 2023-12-28 ENCOUNTER — Encounter: Admitting: Physical Therapy

## 2023-12-29 ENCOUNTER — Ambulatory Visit: Payer: Self-pay | Admitting: Family Medicine

## 2023-12-29 ENCOUNTER — Other Ambulatory Visit: Payer: Self-pay | Admitting: Pharmacy Technician

## 2023-12-29 ENCOUNTER — Encounter (INDEPENDENT_AMBULATORY_CARE_PROVIDER_SITE_OTHER): Payer: Self-pay

## 2023-12-29 ENCOUNTER — Other Ambulatory Visit: Payer: Self-pay

## 2023-12-29 DIAGNOSIS — G453 Amaurosis fugax: Secondary | ICD-10-CM

## 2023-12-29 NOTE — Progress Notes (Signed)
 Hi Katlynn,  Scan looks good of the brain.  Echo should be next and carotid dopllers.

## 2023-12-29 NOTE — Progress Notes (Signed)
 Specialty Pharmacy Refill Coordination Note  Maria Romero is a 73 y.o. female contacted today regarding refills of specialty medication(s) Olaparib  (LYNPARZA )   Patient requested Delivery   Delivery date: 01/03/24   Verified address: [address] [place] [address]   Medication will be filled on 01/02/24. Answered questionnaire.

## 2024-01-01 ENCOUNTER — Other Ambulatory Visit: Payer: Self-pay

## 2024-01-01 NOTE — Progress Notes (Signed)
 Clinical Intervention Note  Clinical Intervention Notes: Patient reported taking aspirin 81 mg daily. No DDIs identified with Lynparza .   Clinical Intervention Outcomes: Prevention of an adverse drug event   Advertising account planner

## 2024-01-05 ENCOUNTER — Other Ambulatory Visit: Payer: Self-pay

## 2024-01-05 DIAGNOSIS — C561 Malignant neoplasm of right ovary: Secondary | ICD-10-CM

## 2024-01-08 ENCOUNTER — Inpatient Hospital Stay

## 2024-01-08 ENCOUNTER — Ambulatory Visit (HOSPITAL_BASED_OUTPATIENT_CLINIC_OR_DEPARTMENT_OTHER)
Admission: RE | Admit: 2024-01-08 | Discharge: 2024-01-08 | Disposition: A | Discharge status: Home / Self Care | Source: Ambulatory Visit | Attending: Hematology & Oncology | Admitting: Hematology & Oncology

## 2024-01-08 ENCOUNTER — Inpatient Hospital Stay: Attending: Hematology & Oncology

## 2024-01-08 ENCOUNTER — Encounter (HOSPITAL_BASED_OUTPATIENT_CLINIC_OR_DEPARTMENT_OTHER): Payer: Self-pay

## 2024-01-08 DIAGNOSIS — Z923 Personal history of irradiation: Secondary | ICD-10-CM | POA: Diagnosis not present

## 2024-01-08 DIAGNOSIS — Z9049 Acquired absence of other specified parts of digestive tract: Secondary | ICD-10-CM | POA: Insufficient documentation

## 2024-01-08 DIAGNOSIS — C569 Malignant neoplasm of unspecified ovary: Secondary | ICD-10-CM | POA: Insufficient documentation

## 2024-01-08 DIAGNOSIS — C7889 Secondary malignant neoplasm of other digestive organs: Secondary | ICD-10-CM | POA: Diagnosis not present

## 2024-01-08 DIAGNOSIS — C787 Secondary malignant neoplasm of liver and intrahepatic bile duct: Secondary | ICD-10-CM | POA: Diagnosis not present

## 2024-01-08 DIAGNOSIS — R918 Other nonspecific abnormal finding of lung field: Secondary | ICD-10-CM | POA: Insufficient documentation

## 2024-01-08 DIAGNOSIS — C561 Malignant neoplasm of right ovary: Secondary | ICD-10-CM | POA: Insufficient documentation

## 2024-01-08 DIAGNOSIS — Z90710 Acquired absence of both cervix and uterus: Secondary | ICD-10-CM | POA: Diagnosis not present

## 2024-01-08 DIAGNOSIS — I2510 Atherosclerotic heart disease of native coronary artery without angina pectoris: Secondary | ICD-10-CM | POA: Diagnosis not present

## 2024-01-08 DIAGNOSIS — K769 Liver disease, unspecified: Secondary | ICD-10-CM | POA: Diagnosis not present

## 2024-01-08 DIAGNOSIS — I700 Atherosclerosis of aorta: Secondary | ICD-10-CM | POA: Diagnosis not present

## 2024-01-08 DIAGNOSIS — R748 Abnormal levels of other serum enzymes: Secondary | ICD-10-CM | POA: Insufficient documentation

## 2024-01-08 LAB — CMP (CANCER CENTER ONLY)
ALT: 27 U/L (ref 0–44)
AST: 36 U/L (ref 15–41)
Albumin: 4.3 g/dL (ref 3.5–5.0)
Alkaline Phosphatase: 66 U/L (ref 38–126)
Anion gap: 13 (ref 5–15)
BUN: 18 mg/dL (ref 8–23)
CO2: 23 mmol/L (ref 22–32)
Calcium: 9.8 mg/dL (ref 8.9–10.3)
Chloride: 105 mmol/L (ref 98–111)
Creatinine: 1.14 mg/dL — ABNORMAL HIGH (ref 0.44–1.00)
GFR, Estimated: 50 mL/min — ABNORMAL LOW (ref >60)
Glucose, Bld: 118 mg/dL — ABNORMAL HIGH (ref 70–99)
Potassium: 3.8 mmol/L (ref 3.5–5.1)
Sodium: 141 mmol/L (ref 135–145)
Total Bilirubin: 0.6 mg/dL (ref 0.0–1.2)
Total Protein: 6.4 g/dL — ABNORMAL LOW (ref 6.5–8.1)

## 2024-01-08 MED ORDER — IOHEXOL 300 MG/ML  SOLN
100.0000 mL | Freq: Once | INTRAMUSCULAR | Status: AC | PRN
  Administered 2024-01-08: 100 mL via INTRAVENOUS

## 2024-01-15 ENCOUNTER — Inpatient Hospital Stay

## 2024-01-15 ENCOUNTER — Encounter: Payer: Self-pay | Admitting: Hematology & Oncology

## 2024-01-15 ENCOUNTER — Inpatient Hospital Stay: Attending: Hematology & Oncology | Admitting: Hematology & Oncology

## 2024-01-15 VITALS — BP 135/81 | HR 72 | Temp 97.8°F | Resp 17 | Ht 63.0 in | Wt 109.1 lb

## 2024-01-15 DIAGNOSIS — C786 Secondary malignant neoplasm of retroperitoneum and peritoneum: Secondary | ICD-10-CM | POA: Insufficient documentation

## 2024-01-15 DIAGNOSIS — M810 Age-related osteoporosis without current pathological fracture: Secondary | ICD-10-CM | POA: Insufficient documentation

## 2024-01-15 DIAGNOSIS — Z9221 Personal history of antineoplastic chemotherapy: Secondary | ICD-10-CM | POA: Insufficient documentation

## 2024-01-15 DIAGNOSIS — R918 Other nonspecific abnormal finding of lung field: Secondary | ICD-10-CM | POA: Diagnosis not present

## 2024-01-15 DIAGNOSIS — Z923 Personal history of irradiation: Secondary | ICD-10-CM | POA: Diagnosis not present

## 2024-01-15 DIAGNOSIS — C561 Malignant neoplasm of right ovary: Secondary | ICD-10-CM

## 2024-01-15 DIAGNOSIS — C7800 Secondary malignant neoplasm of unspecified lung: Secondary | ICD-10-CM | POA: Diagnosis not present

## 2024-01-15 DIAGNOSIS — Z510 Encounter for antineoplastic radiation therapy: Secondary | ICD-10-CM | POA: Insufficient documentation

## 2024-01-15 DIAGNOSIS — Z90722 Acquired absence of ovaries, bilateral: Secondary | ICD-10-CM | POA: Insufficient documentation

## 2024-01-15 DIAGNOSIS — Z8543 Personal history of malignant neoplasm of ovary: Secondary | ICD-10-CM | POA: Diagnosis not present

## 2024-01-15 DIAGNOSIS — Z90710 Acquired absence of both cervix and uterus: Secondary | ICD-10-CM | POA: Diagnosis not present

## 2024-01-15 DIAGNOSIS — R748 Abnormal levels of other serum enzymes: Secondary | ICD-10-CM

## 2024-01-15 LAB — CMP (CANCER CENTER ONLY)
ALT: 28 U/L (ref 0–44)
AST: 36 U/L (ref 15–41)
Albumin: 4.4 g/dL (ref 3.5–5.0)
Alkaline Phosphatase: 75 U/L (ref 38–126)
Anion gap: 14 (ref 5–15)
BUN: 19 mg/dL (ref 8–23)
CO2: 23 mmol/L (ref 22–32)
Calcium: 9.6 mg/dL (ref 8.9–10.3)
Chloride: 105 mmol/L (ref 98–111)
Creatinine: 0.98 mg/dL (ref 0.44–1.00)
GFR, Estimated: 60 mL/min — ABNORMAL LOW (ref >60)
Glucose, Bld: 96 mg/dL (ref 70–99)
Potassium: 3.5 mmol/L (ref 3.5–5.1)
Sodium: 141 mmol/L (ref 135–145)
Total Bilirubin: 0.7 mg/dL (ref 0.0–1.2)
Total Protein: 6.5 g/dL (ref 6.5–8.1)

## 2024-01-15 LAB — CBC WITH DIFFERENTIAL (CANCER CENTER ONLY)
Abs Immature Granulocytes: 0.03 K/uL (ref 0.00–0.07)
Basophils Absolute: 0 K/uL (ref 0.0–0.1)
Basophils Relative: 1 %
Eosinophils Absolute: 0 K/uL (ref 0.0–0.5)
Eosinophils Relative: 1 %
HCT: 28.7 % — ABNORMAL LOW (ref 36.0–46.0)
Hemoglobin: 10 g/dL — ABNORMAL LOW (ref 12.0–15.0)
Immature Granulocytes: 1 %
Lymphocytes Relative: 20 %
Lymphs Abs: 0.6 K/uL — ABNORMAL LOW (ref 0.7–4.0)
MCH: 43.5 pg — ABNORMAL HIGH (ref 26.0–34.0)
MCHC: 34.8 g/dL (ref 30.0–36.0)
MCV: 124.8 fL — ABNORMAL HIGH (ref 80.0–100.0)
Monocytes Absolute: 0.3 K/uL (ref 0.1–1.0)
Monocytes Relative: 12 %
Neutro Abs: 1.9 K/uL (ref 1.7–7.7)
Neutrophils Relative %: 65 %
Platelet Count: 164 K/uL (ref 150–400)
RBC: 2.3 MIL/uL — ABNORMAL LOW (ref 3.87–5.11)
RDW: 14.4 % (ref 11.5–15.5)
WBC Count: 2.8 K/uL — ABNORMAL LOW (ref 4.0–10.5)
nRBC: 0 % (ref 0.0–0.2)

## 2024-01-15 LAB — IRON AND IRON BINDING CAPACITY (CC-WL,HP ONLY)
Iron: 135 ug/dL (ref 28–170)
Saturation Ratios: 46 % — ABNORMAL HIGH (ref 10.4–31.8)
TIBC: 297 ug/dL (ref 250–450)
UIBC: 162 ug/dL

## 2024-01-15 LAB — RETICULOCYTES
Immature Retic Fract: 22.8 % — ABNORMAL HIGH (ref 2.3–15.9)
RBC.: 2.25 MIL/uL — ABNORMAL LOW (ref 3.87–5.11)
Retic Count, Absolute: 60.8 K/uL (ref 19.0–186.0)
Retic Ct Pct: 2.7 % (ref 0.4–3.1)

## 2024-01-15 LAB — FERRITIN: Ferritin: 240 ng/mL (ref 11–307)

## 2024-01-15 NOTE — Patient Instructions (Signed)

## 2024-01-15 NOTE — Progress Notes (Signed)
 Given his minimal Hematology and Oncology Follow Up Visit  Maria Romero [id number] Apr 29, 1950 73 y.o. 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 Year --next dose 06/2024 SBRT --completed on 10/16/2023     Interim History:  Maria Romero is back for follow-up.  She recently had a CT scan that was done.  This was done on 01/08/2024.  Everything looked okay although looks like there was some slight enlargement of a couple pulmonary nodules.  In the left upper lobe 1 measures 8 mm.  Where she had the stereotactic radiosurgery, there is no change in the nodule measuring 2 x 1.3 cm.  Her last CA 125 was actually little bit better at 18.  I really think that we should consider stereotactic radiosurgery for these 2 lung nodules.  I think this would be a very good way of treating her without having to go with chemotherapy.  I just think that doing systemic chemotherapy on her would probably have greater risk even though I think it would certainly work.  I talked to her about this.  She comes in with a I think comes in.  She is certainly agreeable to this radiosurgery for the lung nodules.  I spoke with Dr. Shannon of Radiation Oncology.  He will take a look at the CT scan and see if she is a candidate.  She has had no cough.  There is been no chest pain.  She has had no nausea or vomiting.  She has had no headache.  She has had no leg swelling.  She has had no fever.  There is been no bleeding.  Overall, I will say that her performance status is probably ECOG 1.    Wt Readings from  Last 3 Encounters:  01/15/24 109 lb 1.9 oz (49.5 kg)  12/20/23 110 lb (49.9 kg)  11/23/23 112 lb 9.6 oz (51.1 kg)    Medications:  Current Outpatient Medications:    amLODipine  (NORVASC ) 2.5 MG tablet, TAKE 1 TABLET BY MOUTH DAILY, Disp: 90 tablet, Rfl: 3   amoxicillin  (AMOXIL ) 500 MG tablet, Take 2,000 mg by mouth once. Take 1 hour prior to dental procedure., Disp: , Rfl:    aspirin 81 MG chewable tablet, Chew by mouth daily., Disp: , Rfl:    atorvastatin  (LIPITOR) 40 MG tablet, TAKE ONE TABLET BY MOUTH EVERY NIGHT AT BEDTIME, Disp: 90 tablet, Rfl: 3   Biotin 10000 MCG TABS, Take 10,000 mcg by mouth daily., Disp: , Rfl:    Calcium -Vitamin D -Vitamin K (VIACTIV PO), Take 1 tablet by mouth in the morning and at bedtime., Disp: , Rfl:    cholecalciferol (VITAMIN D3) 25 MCG (1000 UNIT) tablet, Take 1,000 Units by mouth daily., Disp: , Rfl:    conjugated estrogens  (PREMARIN ) vaginal cream, Place 1 Applicatorful vaginally 3 (three) times a week., Disp: 42.5 g, Rfl: 3   Cyanocobalamin  (VITAMIN B-12) 5000 MCG TBDP, Take 1 tablet by mouth daily at 6 (six) AM., Disp: , Rfl:    lidocaine -prilocaine  (EMLA ) cream, Apply a dime size of cream to the port-a-cath 1-2 hours prior to access. Cover  with [employer]., Disp: 30 g, Rfl: 3   magnesium  oxide (MAG-OX) 400 (240 Mg) MG tablet, Take 1 tablet (400 mg total) by mouth 2 (two) times daily., Disp: 60 tablet, Rfl: 5   meclizine  (ANTIVERT ) 25 MG tablet, Take 1 tablet (25 mg total) by mouth 3 (three) times daily as needed for dizziness., Disp: 30 tablet, Rfl: 0   meloxicam  (MOBIC ) 15 MG tablet, One tab PO every 24 hours with a meal for 2 weeks, then once every 24 hours prn pain., Disp: 30 tablet, Rfl: 3   olaparib  (LYNPARZA ) 150 MG tablet, Take 2 tablets (300 mg total) by mouth 2 (two) times daily. Swallow whole. May take with food to decrease nausea and vomiting. Take as instructed per MD, Disp: 120 tablet, Rfl: 3   omeprazole  (PRILOSEC ) 20 MG capsule, TAKE 1  CAPSULE BY MOUTH DAILY, Disp: 90 capsule, Rfl: 2   ondansetron  (ZOFRAN ) 8 MG tablet, Take 1 tablet (8 mg total) by mouth every 8 (eight) hours as needed for nausea or vomiting., Disp: 20 tablet, Rfl: 0   senna (SENOKOT) 8.6 MG tablet, Take 1 tablet by mouth daily., Disp: , Rfl:    TYLENOL  500 MG tablet, Take 500-1,000 mg by mouth every 6 (six) hours as needed for mild pain or headache., Disp: , Rfl:   Current Facility-Administered Medications:    zoledronic  acid (RECLAST ) injection 5 mg, 5 mg, Intravenous, Once,   Allergies:  Allergies  Allergen Reactions   Fluvastatin Sodium Other (See Comments)    Myalgias   Sudafed [Pseudoephedrine Hcl] Other (See Comments)    Makes her feel high    Past Medical History, Surgical history, Social history, and Family History were reviewed and updated.  Review of Systems: Review of Systems  Constitutional: Negative.   HENT:  Negative.    Eyes: Negative.   Respiratory: Negative.    Cardiovascular: Negative.   Gastrointestinal:  Negative for abdominal pain.  Genitourinary:  Negative for pelvic pain.   Musculoskeletal: Negative.   Skin: Negative.   Neurological: Negative.   Hematological: Negative.   Psychiatric/Behavioral: Negative.      Physical Exam:  height is 5' 3 (1.6 m) and weight is 109 lb 1.9 oz (49.5 kg). Her oral temperature is 97.8 F (36.6 C). Her blood pressure is 135/81 and her pulse is 72. Her respiration is 17 and oxygen saturation is 100%.   Wt Readings from Last 3 Encounters:  01/15/24 109 lb 1.9 oz (49.5 kg)  12/20/23 110 lb (49.9 kg)  11/23/23 112 lb 9.6 oz (51.1 kg)    Physical Exam Vitals reviewed.  HENT:     Head: Normocephalic and atraumatic.  Eyes:     Pupils: Pupils are equal, round, and reactive to light.  Cardiovascular:     Rate and Rhythm: Normal rate and regular rhythm.     Heart sounds: Normal heart sounds.  Pulmonary:     Effort: Pulmonary effort is normal.     Breath sounds: Normal breath  sounds.  Chest:    Abdominal:     General: Bowel sounds are normal.     Palpations: Abdomen is soft.     Comments: Abdominal exam is soft.  She has good bowel sounds.  There is no fluid wave.  She has a well-healed laparotomy scar.  She has a scar where she had the colostomy.  There is no palpable abdominal mass.  There is no palpable liver or spleen tip.    Musculoskeletal:  General: No tenderness or deformity. Normal range of motion.     Cervical back: Normal range of motion.  Lymphadenopathy:     Cervical: No cervical adenopathy.  Skin:    General: Skin is warm and dry.     Findings: No erythema or rash.  Neurological:     Mental Status: She is alert and oriented to person, place, and time.  Psychiatric:        Behavior: Behavior normal.        Thought Content: Thought content normal.        Judgment: Judgment normal.      Lab Results  Component Value Date   WBC 2.8 (L) 01/15/2024   HGB 10.0 (L) 01/15/2024   HCT 28.7 (L) 01/15/2024   MCV 124.8 (H) 01/15/2024   PLT 164 01/15/2024     Chemistry      Component Value Date/Time   NA 141 01/15/2024 0957   K 3.5 01/15/2024 0957   CL 105 01/15/2024 0957   CO2 23 01/15/2024 0957   BUN 19 01/15/2024 0957   CREATININE 0.98 01/15/2024 0957   CREATININE 0.93 11/03/2021 0000      Component Value Date/Time   CALCIUM  9.6 01/15/2024 0957   ALKPHOS 75 01/15/2024 0957   AST 36 01/15/2024 0957   ALT 28 01/15/2024 0957   BILITOT 0.7 01/15/2024 0957        Impression and Plan: Ms. Plasse is a very charming 73 year old white female.  She had neoadjuvant chemotherapy.  She really had a tough time with neoadjuvant chemotherapy.  She subsequently underwent surgical resection.  She did still have active disease.    We then gave her some additional chemotherapy.  By her CA-125, she was then remission.  I think that her colostomy reversal certainly confirms that she was in remission.  I do think that the radiosurgery  helped for the peritoneal metastasis.  Now, I have to see if she can have radiosurgery for the lung metastasis.  Again, it be nice to try to do radiosurgery.  I would like to try to get her through the Holiday season without having to give her chemotherapy.  She clearly has all to go metastatic disease.  As such, I think we can try to treat these areas locally.  I would like to try to get her back to see us  in another month or so.  If she does have radiosurgery, I probably would not do any scans until the end of the year.  Maude JONELLE Crease, MD 10/6/202511:45 AM

## 2024-01-16 ENCOUNTER — Ambulatory Visit (HOSPITAL_BASED_OUTPATIENT_CLINIC_OR_DEPARTMENT_OTHER)
Admission: RE | Admit: 2024-01-16 | Discharge: 2024-01-16 | Disposition: A | Discharge status: Home / Self Care | Source: Ambulatory Visit | Attending: Family Medicine | Admitting: Family Medicine

## 2024-01-16 ENCOUNTER — Other Ambulatory Visit: Payer: Self-pay | Admitting: Family Medicine

## 2024-01-16 ENCOUNTER — Ambulatory Visit: Payer: Self-pay | Admitting: Family Medicine

## 2024-01-16 DIAGNOSIS — R9431 Abnormal electrocardiogram [ECG] [EKG]: Secondary | ICD-10-CM

## 2024-01-16 DIAGNOSIS — E7800 Pure hypercholesterolemia, unspecified: Secondary | ICD-10-CM

## 2024-01-16 DIAGNOSIS — G453 Amaurosis fugax: Secondary | ICD-10-CM

## 2024-01-16 LAB — ECHOCARDIOGRAM COMPLETE
AR max vel: 2.49 cm2
AV Area VTI: 2.74 cm2
AV Area mean vel: 2.5 cm2
AV Mean grad: 2.3 mmHg
AV Peak grad: 5 mmHg
AV Vena cont: 0.2 cm
Ao pk vel: 1.12 m/s
Area-P 1/2: 5.75 cm2
Calc EF: 58 %
MV M vel: 4.96 m/s
MV Peak grad: 98.4 mmHg
S' Lateral: 2.6 cm
Single Plane A2C EF: 59.7 %
Single Plane A4C EF: 56.7 %

## 2024-01-16 LAB — CA 125: Cancer Antigen (CA) 125: 21.4 U/mL (ref 0.0–38.1)

## 2024-01-16 NOTE — Progress Notes (Signed)
 Call patient: Carotid ultrasound overall looks good.  No significant plaque buildup or narrowing on the right side there is a little bit of narrowing on the left but nothing major or anything that should be causing a problem.  Still relatively good blood flow.

## 2024-01-17 NOTE — Progress Notes (Signed)
 Location of tumor and Histology per Pathology Report:   Biopsy:    Past/Anticipated interventions by surgeon, if any: {t:21944} ***  Past/Anticipated interventions by medical oncology, if any:     Pain issues, if any:  {:18581} {PAIN DESCRIPTION:21022940}  SAFETY ISSUES: Prior radiation? {:18581} Pacemaker/ICD? {:18581} Possible current pregnancy? no Is the patient on methotrexate? {:18581}  Current Complaints / other details:  ***     ***

## 2024-01-17 NOTE — Progress Notes (Shared)
 Radiation Oncology         [phone] ________________________________  Initial Outpatient Consultation  Name: Maria Romero MRN: [id number]  Date: 01/18/2024  DOB: [birth year]  RR:Fzuyzwzb, Dorothyann BIRCH, MD  Timmy Maude SAUNDERS, MD   REFERRING PHYSICIAN: Timmy Maude SAUNDERS, MD  DIAGNOSIS: There were no encounter diagnoses.  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 female who is accompanied by ***. she is seen as a courtesy of Dr. Timmy for an opinion concerning radiation therapy as part of management for her enlarged lung nodules. She is known to me for her history of radiation treatments performed earlier this year. She was last seen in office on 11/13/23 for a follow up visit.   In the interval since she was last seen, she presented for a follow up visit with Dr. Timmy on 11/23/23 during which she reported doing well without any complains.   She underwent a CT CAP on 01/08/24 showing multiple pulmonary nodules have slightly enlarged, for example a nodule in the medial anterior left upper lobe measuring 0.8 cm, previously 0.5 cm. Scan also noted a new, heterogeneous hypodensity of the anterior liver underlying this nodule delineating a radiation port with findings are consistent with interval radiation therapy. No evidence of new metastatic disease in the abdomen or pelvis was noted.   Brain MRI on 12/26/23 showing no significant abnormalities in the brain.   During a recent follow up with Dr. Timmy on 01/15/24, upon discussion, they opted to proceed with radiation treatment to the enlarging lung nodules.      PREVIOUS RADIATION THERAPY: Yes *** Intent: Curative   Radiation Treatment Dates: First Treatment Date: 2023-10-02 -- Last Treatment Date: 2023-10-16 Site/Dose/Technique/Mode:  Plan Name: Abd_UHRT Site: Abdomen Technique: IMRT Mode: Photon Dose Per Fraction: 5 Gy Prescribed Dose (Delivered / Prescribed): 50 Gy / 50 Gy Prescribed Fxs (Delivered / Prescribed): 10 / 10  PAST MEDICAL HISTORY:  Past Medical History:  Diagnosis Date   Anemia    Complication of anesthesia    Dyspnea    GERD (gastroesophageal reflux disease)    Goals of care, counseling/discussion 12/01/2021   History of radiation therapy    Abdomen-10/02/23-10/16/23- [facility]   Hypercholesterolemia    Hypertension    IBS (irritable bowel syndrome)    Ileostomy in place [facility])    Malignant ascites (HCC) 11/19/2021   Neuromuscular disorder (HCC)    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   Osteopenia    Osteoporosis    [name], right (HCC) 12/01/2021   Ovarian mass, right 11/19/2021   PONV (postoperative nausea and vomiting)    Toe fracture    lt small toe    PAST SURGICAL HISTORY: Past Surgical History:  Procedure Laterality Date   BOWEL RESECTION N/A 04/20/2022   Procedure: BOWEL RESECTION; DIVERTING ILEOSTOMY;  Surgeon: Viktoria Comer SAUNDERS, MD;  Location: [facility];  Service: Gynecology;  Laterality: N/A;   DEBULKING N/A 04/20/2022   Procedure: TUMOR DEBULKING, OMENTECTOMY;  Surgeon: Viktoria Comer SAUNDERS, MD;  Location: [facility];  Service: Gynecology;  Laterality: N/A;   HYSTERECTOMY ABDOMINAL WITH SALPINGO-OOPHORECTOMY Bilateral 04/20/2022   Procedure: HYSTERECTOMY ABDOMINAL WITH BILATERAL SALPINGO-OOPHORECTOMY;  Surgeon: Viktoria Comer SAUNDERS, MD;  Location: [facility];  Service: Gynecology;  Laterality: Bilateral;   ILEOSTOMY CLOSURE N/A 09/07/2022   Procedure: ILEOSTOMY TAKEDOWN;  Surgeon: Viktoria Comer SAUNDERS, MD;  Location: [facility];  Service: General;  Laterality: N/A;   IR IMAGING GUIDED PORT INSERTION  11/26/2021   IR PARACENTESIS  11/23/2021   IR PARACENTESIS  11/26/2021    LAPAROSCOPY N/A 04/20/2022   Procedure: LAPAROSCOPY DIAGNOSTIC,CYSTO;  Surgeon: Viktoria Comer SAUNDERS, MD;  Location: [facility];  Service: Gynecology;  Laterality: N/A;   lump removal  04/11/1993   axilla right - benign   TONSILLECTOMY     TUBAL LIGATION  04/11/1988    FAMILY HISTORY:  Family History  Problem Relation Age of Onset   Stroke Mother    Hypertension Mother    Hyperlipidemia Mother    Glaucoma Mother    Heart attack Father 43   Diabetes Father    Hypertension Father    Hyperlipidemia Father    Cataracts Father    Breast cancer Cousin    Colon cancer Neg Hx    Ovarian cancer Neg Hx    Endometrial cancer Neg Hx    Pancreatic cancer Neg Hx    Prostate cancer Neg Hx     SOCIAL HISTORY:  Social History   Tobacco Use   Smoking status: Former    Current packs/day: 0.00    Average packs/day: 1.5 packs/day for 20.0 years (30.0 ttl pk-yrs)    Types: Cigarettes    Start date: 03/20/1970    Quit date: 03/20/1990    Years since quitting: 33.8   Smokeless tobacco: Never   Tobacco comments:    quit 1988  Vaping Use   Vaping status: Never Used  Substance Use Topics   Alcohol use: Not Currently    Alcohol/week: 2.0 standard drinks of alcohol    Types: 2 Glasses of wine per week    Comment: Wine in the evenings   Drug use: No    ALLERGIES:  Allergies  Allergen Reactions   Fluvastatin Sodium Other (See Comments)    Myalgias   Sudafed [Pseudoephedrine Hcl] Other (See Comments)    Makes her feel high    MEDICATIONS:  Current Outpatient Medications  Medication Sig Dispense Refill   amLODipine  (NORVASC ) 2.5 MG tablet TAKE 1 TABLET BY MOUTH DAILY 90 tablet 3   amoxicillin  (AMOXIL ) 500 MG tablet Take 2,000 mg by mouth once. Take 1 hour prior to dental procedure.     aspirin 81 MG chewable tablet Chew by mouth daily.     atorvastatin  (LIPITOR) 40 MG tablet TAKE 1 TABLET BY MOUTH EVERY NIGHT AT BEDTIME 90 tablet 3   Biotin 89999 MCG TABS Take 10,000 mcg by mouth  daily.     Calcium -Vitamin D -Vitamin K (VIACTIV PO) Take 1 tablet by mouth in the morning and at bedtime.     cholecalciferol (VITAMIN D3) 25 MCG (1000 UNIT) tablet Take 1,000 Units by mouth daily.     conjugated estrogens  (PREMARIN ) vaginal cream Place 1 Applicatorful vaginally 3 (three) times a week. 42.5 g 3   Cyanocobalamin  (VITAMIN B-12) 5000 MCG TBDP Take 1 tablet by mouth daily at 6 (six) AM.     lidocaine -prilocaine  (EMLA ) cream Apply a dime size of cream to the port-a-cath 1-2 hours prior to access. Cover with [employer]. 30 g 3   magnesium  oxide (MAG-OX) 400 (240 Mg) MG tablet Take 1 tablet (400 mg total) by mouth 2 (two) times daily. 60 tablet 5   meclizine  (ANTIVERT ) 25 MG tablet Take 1 tablet (  25 mg total) by mouth 3 (three) times daily as needed for dizziness. 30 tablet 0   meloxicam  (MOBIC ) 15 MG tablet One tab PO every 24 hours with a meal for 2 weeks, then once every 24 hours prn pain. 30 tablet 3   olaparib  (LYNPARZA ) 150 MG tablet Take 2 tablets (300 mg total) by mouth 2 (two) times daily. Swallow whole. May take with food to decrease nausea and vomiting. Take as instructed per MD 120 tablet 3   omeprazole  (PRILOSEC ) 20 MG capsule TAKE 1 CAPSULE BY MOUTH DAILY 90 capsule 2   ondansetron  (ZOFRAN ) 8 MG tablet Take 1 tablet (8 mg total) by mouth every 8 (eight) hours as needed for nausea or vomiting. 20 tablet 0   senna (SENOKOT) 8.6 MG tablet Take 1 tablet by mouth daily.     TYLENOL  500 MG tablet Take 500-1,000 mg by mouth every 6 (six) hours as needed for mild pain or headache.     Current Facility-Administered Medications  Medication Dose Route Frequency Provider Last Rate Last Admin   zoledronic  acid (RECLAST ) injection 5 mg  5 mg Intravenous Once         REVIEW OF SYSTEMS:  A 10+ POINT REVIEW OF SYSTEMS WAS OBTAINED including neurology, dermatology, psychiatry, cardiac, respiratory, lymph, extremities, GI, GU, musculoskeletal, constitutional, reproductive, HEENT. ***    PHYSICAL EXAM:  vitals were not taken for this visit.   General: Alert and oriented, in no acute distress HEENT: Head is normocephalic. Extraocular movements are intact. Oropharynx is clear. Neck: Neck is supple, no palpable cervical or supraclavicular lymphadenopathy. Heart: Regular in rate and rhythm with no murmurs, rubs, or gallops. Chest: Clear to auscultation bilaterally, with no rhonchi, wheezes, or rales. Abdomen: Soft, nontender, nondistended, with no rigidity or guarding. Extremities: No cyanosis or edema. Lymphatics: see Neck Exam Skin: No concerning lesions. Musculoskeletal: symmetric strength and muscle tone throughout. Neurologic: Cranial nerves II through XII are grossly intact. No obvious focalities. Speech is fluent. Coordination is intact. Psychiatric: Judgment and insight are intact. Affect is appropriate. ***  ECOG = ***  0 - Asymptomatic (Fully active, able to carry on all predisease activities without restriction)  1 - Symptomatic but completely ambulatory (Restricted in physically strenuous activity but ambulatory and able to carry out work of a light or sedentary nature. For example, light housework, office work)  2 - Symptomatic, <50% in bed during the day (Ambulatory and capable of all self care but unable to carry out any work activities. Up and about more than 50% of waking hours)  3 - Symptomatic, >50% in bed, but not bedbound (Capable of only limited self-care, confined to bed or chair 50% or more of waking hours)  4 - Bedbound (Completely disabled. Cannot carry on any self-care. Totally confined to bed or chair)  5 - Death   Raylene MM, Creech RH, Tormey DC, et al. [phone]). Toxicity and response criteria of the [facility]. Am. DOROTHA Bridges. Oncol. 5 (6): 649-55  LABORATORY DATA:  Lab Results  Component Value Date   WBC 2.8 (L) 01/15/2024   HGB 10.0 (L) 01/15/2024   HCT 28.7 (L) 01/15/2024   MCV 124.8 (H) 01/15/2024   PLT 164  01/15/2024   NEUTROABS 1.9 01/15/2024   Lab Results  Component Value Date   NA 141 01/15/2024   K 3.5 01/15/2024   CL 105 01/15/2024   CO2 23 01/15/2024   GLUCOSE 96 01/15/2024   BUN 19 01/15/2024   CREATININE 0.98  01/15/2024   CALCIUM  9.6 01/15/2024      RADIOGRAPHY: VAS US  CAROTID Result Date: 01/17/2024 Carotid Arterial Duplex Study Patient Name:  Maria Romero  Date of Exam:   01/16/2024 Medical Rec #: [id number]         Accession #:    [id number] Date of Birth: Oct 31, 1950         Patient Gender: F Patient Age:   68 years Exam Location:  [place] Procedure:      VAS US  CAROTID Referring Phys: CATHERINE METHENEY --------------------------------------------------------------------------------  Indications:  Visual disturbance and Amaurosis fugax. Risk Factors: Hypertension, hyperlipidemia, past history of smoking. Performing Technologist: Alan Greenhouse RDMS, RVT, RDCS  Examination Guidelines: A complete evaluation includes B-mode imaging, spectral Doppler, color Doppler, and power Doppler as needed of all accessible portions of each vessel. Bilateral testing is considered an integral part of a complete examination. Limited examinations for reoccurring indications may be performed as noted.  Right Carotid Findings: +----------+--------+--------+--------+------------------+--------+           PSV cm/sEDV cm/sStenosisPlaque DescriptionComments +----------+--------+--------+--------+------------------+--------+ CCA Prox  95      19                                         +----------+--------+--------+--------+------------------+--------+ CCA Distal64      16                                         +----------+--------+--------+--------+------------------+--------+ ICA Prox  55      13      Normal                             +----------+--------+--------+--------+------------------+--------+ ICA Mid   78      26                                          +----------+--------+--------+--------+------------------+--------+ ICA Distal65      20                                         +----------+--------+--------+--------+------------------+--------+ ECA       65      10                                         +----------+--------+--------+--------+------------------+--------+ +----------+--------+-------+----------------+-------------------+           PSV cm/sEDV cmsDescribe        Arm Pressure (mmHG) +----------+--------+-------+----------------+-------------------+ [id number]      5      Multiphasic, TWO884                 +----------+--------+-------+----------------+-------------------+ +---------+--------+--+--------+--+---------+ VertebralPSV cm/s42EDV cm/s13Antegrade +---------+--------+--+--------+--+---------+  Left Carotid Findings: +----------+--------+--------+--------+-------------------+-----------+           PSV cm/sEDV cm/sStenosisPlaque Description Comments    +----------+--------+--------+--------+-------------------+-----------+ CCA Prox  99      27                                             +----------+--------+--------+--------+-------------------+-----------+  CCA Distal68      19                                             +----------+--------+--------+--------+-------------------+-----------+ ICA Prox  66      21      1-39%   calcific and smoothLower range +----------+--------+--------+--------+-------------------+-----------+ ICA Mid   72      23                                             +----------+--------+--------+--------+-------------------+-----------+ ICA Distal88      29                                             +----------+--------+--------+--------+-------------------+-----------+ ECA       80      15                                             +----------+--------+--------+--------+-------------------+-----------+  +----------+--------+--------+----------------+-------------------+           PSV cm/sEDV cm/sDescribe        Arm Pressure (mmHG) +----------+--------+--------+----------------+-------------------+ Subclavian113     11      Multiphasic, TWO884                 +----------+--------+--------+----------------+-------------------+ +---------+--------+--+--------+--+---------+ VertebralPSV cm/s44EDV cm/s12Antegrade +---------+--------+--+--------+--+---------+   Summary: Right Carotid: The extracranial vessels were near-normal with only minimal wall                thickening or plaque. Left Carotid: Velocities in the left ICA are consistent with a 1-39% stenosis. Vertebrals:  Bilateral vertebral arteries demonstrate antegrade flow. Subclavians: Normal flow hemodynamics were seen in bilateral subclavian              arteries. *See table(s) above for measurements and observations.  Electronically signed by Redell Leiter MD on 01/17/2024 at 10:26:34 AM.    Final    ECHOCARDIOGRAM COMPLETE Result Date: 01/16/2024    ECHOCARDIOGRAM REPORT   Patient Name:   Maria Romero Date of Exam: 01/16/2024 Medical Rec #:  [id number]        Height:       63.0 in Accession #:    [id number]       Weight:       109.1 lb Date of Birth:  [birth year]        BSA:          1.495 m Patient Age:    73 years         BP:           115/61 mmHg Patient Gender: F                HR:           64 bpm. Exam Location:  [place] [facility] Procedure: 2D Echo, 3D Echo, Cardiac Doppler, Color Doppler and Strain Analysis            (Both Spectral and Color Flow Doppler were utilized during  procedure). Indications:    R94.31 Abnormal EKG  History:        Patient has prior history of Echocardiogram examinations, most                 recent 11/24/2021. Abnormal ECG, Amaurosis fugax, Ovarian cancer;                 Risk Factors:Hypertension, Dyslipidemia and Former Smoker.  Sonographer:    Alan Greenhouse RDMS, RVT, RDCS Referring Phys: [id number]  CATHERINE D METHENEY IMPRESSIONS  1. Left ventricular ejection fraction, by estimation, is 60 to 65%. The left ventricle has normal function. The left ventricle has no regional wall motion abnormalities. Left ventricular diastolic parameters are consistent with Grade I diastolic dysfunction (impaired relaxation). The average left ventricular global longitudinal strain is -26.0 %. The global longitudinal strain is normal.  2. Right ventricular systolic function is normal. The right ventricular size is normal.  3. The mitral valve is normal in structure. Mild mitral valve regurgitation. No evidence of mitral stenosis.  4. The aortic valve is normal in structure. Aortic valve regurgitation is mild. No aortic stenosis is present.  5. The inferior vena cava is normal in size with greater than 50% respiratory variability, suggesting right atrial pressure of 3 mmHg. Comparison(s): Echocardiogram done 11/24/21 showed an EF of 60-65%. FINDINGS  Left Ventricle: Left ventricular ejection fraction, by estimation, is 60 to 65%. The left ventricle has normal function. The left ventricle has no regional wall motion abnormalities. The average left ventricular global longitudinal strain is -26.0 %. Strain was performed and the global longitudinal strain is normal. The left ventricular internal cavity size was normal in size. There is no left ventricular hypertrophy. Left ventricular diastolic parameters are consistent with Grade I diastolic dysfunction (impaired relaxation). Right Ventricle: The right ventricular size is normal. No increase in right ventricular wall thickness. Right ventricular systolic function is normal. Left Atrium: Left atrial size was normal in size. Right Atrium: Right atrial size was normal in size. Pericardium: There is no evidence of pericardial effusion. Mitral Valve: The mitral valve is normal in structure. Mild mitral valve regurgitation. No evidence of mitral valve stenosis. Tricuspid Valve: The  tricuspid valve is normal in structure. Tricuspid valve regurgitation is not demonstrated. No evidence of tricuspid stenosis. Aortic Valve: The aortic valve is normal in structure. Aortic valve regurgitation is mild. No aortic stenosis is present. Aortic valve mean gradient measures 2.3 mmHg. Aortic valve peak gradient measures 5.0 mmHg. Aortic valve area, by VTI measures 2.74 cm. Pulmonic Valve: The pulmonic valve was normal in structure. Pulmonic valve regurgitation is not visualized. No evidence of pulmonic stenosis. Aorta: The aortic root is normal in size and structure. Venous: The inferior vena cava is normal in size with greater than 50% respiratory variability, suggesting right atrial pressure of 3 mmHg. IAS/Shunts: No atrial level shunt detected by color flow Doppler.  LEFT VENTRICLE PLAX 2D LVIDd:         4.90 cm     Diastology LVIDs:         2.60 cm     LV e' medial:    8.92 cm/s LV PW:         0.80 cm     LV E/e' medial:  10.1 LV IVS:        0.90 cm     LV e' lateral:   8.38 cm/s LVOT diam:     2.00 cm     LV E/e' lateral: 10.8 LV  SV:         66 LV SV Index:   44          2D Longitudinal Strain LVOT Area:     3.14 cm    2D Strain GLS (A4C):   -23.9 % LV IVRT:       103 msec    2D Strain GLS (A3C):   -26.4 %                            2D Strain GLS (A2C):   -27.8 %                            2D Strain GLS Avg:     -26.0 % LV Volumes (MOD) LV vol d, MOD A2C: 62.6 ml LV vol d, MOD A4C: 56.8 ml LV vol s, MOD A2C: 25.2 ml 3D Volume EF: LV vol s, MOD A4C: 24.6 ml 3D EF:        63 % LV SV MOD A2C:     37.4 ml LV EDV:       104 ml LV SV MOD A4C:     56.8 ml LV ESV:       39 ml LV SV MOD BP:      35.3 ml LV SV:        65 ml RIGHT VENTRICLE RV S prime:     12.40 cm/s TAPSE (M-mode): 2.2 cm LEFT ATRIUM             Index        RIGHT ATRIUM           Index LA diam:        3.20 cm 2.14 cm/m   RA Area:     10.10 cm LA Vol (A2C):   21.2 ml 14.18 ml/m  RA Volume:   19.10 ml  12.78 ml/m LA Vol (A4C):   27.2 ml  18.20 ml/m LA Biplane Vol: 25.3 ml 16.93 ml/m  AORTIC VALVE AV Area (Vmax):    2.49 cm AV Area (Vmean):   2.50 cm AV Area (VTI):     2.74 cm AV Vmax:           111.60 cm/s AV Vmean:          73.500 cm/s AV VTI:            0.241 m AV Peak Grad:      5.0 mmHg AV Mean Grad:      2.3 mmHg LVOT Vmax:         88.60 cm/s LVOT Vmean:        58.500 cm/s LVOT VTI:          0.210 m LVOT/AV VTI ratio: 0.87 AR Vena Contracta: 0.20 cm  AORTA Ao Root diam: 3.25 cm Ao Asc diam:  3.00 cm MITRAL VALVE               TRICUSPID VALVE MV Area (PHT): 5.75 cm    TR Peak grad:   19.0 mmHg MV Decel Time: 132 msec    TR Vmax:        218.00 cm/s MR Peak grad: 98.4 mmHg MR Vmax:      496.00 cm/s  SHUNTS MV E velocity: 90.10 cm/s  Systemic VTI:  0.21 m MV A velocity: 91.20 cm/s  Systemic Diam: 2.00 cm MV E/A ratio:  0.99 Jennifer Crape MD  Electronically signed by Jennifer Crape MD Signature Date/Time: 01/16/2024/2:31:17 PM    Final    CT CHEST ABDOMEN PELVIS W CONTRAST Result Date: 01/08/2024 CLINICAL DATA:  Ovarian cancer, assess treatment response, status post radiation therapy to liver for metastasis * Tracking Code: BO * EXAM: CT CHEST, ABDOMEN, AND PELVIS WITH CONTRAST TECHNIQUE: Multidetector CT imaging of the chest, abdomen and pelvis was performed following the standard protocol during bolus administration of intravenous contrast. RADIATION DOSE REDUCTION: This exam was performed according to the departmental dose-optimization program which includes automated exposure control, adjustment of the mA and/or kV according to patient size and/or use of iterative reconstruction technique. CONTRAST:  OMNIPAQUE  IOHEXOL  300 MG/ML SOLN additional oral enteric contrast COMPARISON:  CT abdomen pelvis, 08/15/2023, CT chest abdomen pelvis, 06/22/2023 FINDINGS: CT CHEST FINDINGS Cardiovascular: Right chest port catheter. Aortic atherosclerosis. Normal heart size. Left and right coronary artery calcifications. No pericardial effusion.  Mediastinum/Nodes: No enlarged mediastinal, hilar, or axillary lymph nodes. Thyroid  gland, trachea, and esophagus demonstrate no significant findings. Lungs/Pleura: Multiple pulmonary nodules have slightly enlarged, for example a nodule in the medial anterior left upper lobe measuring 0.8 cm, previously 0.5 cm (series 302, image 37). No pleural effusion or pneumothorax. Musculoskeletal: No chest wall abnormality. No acute osseous findings. CT ABDOMEN PELVIS FINDINGS Hepatobiliary: No significant change in a nodule anterior to the liver dome measuring 2.0 x 1.3 cm (series 301, image 48). New, heterogeneous hypodensity of the anterior liver underlying this nodule delineating a radiation port. Contracted gallbladder. No gallstones, gallbladder wall thickening, or biliary dilatation. Pancreas: Unremarkable. No pancreatic ductal dilatation or surrounding inflammatory changes. Spleen: Normal in size without significant abnormality. Adrenals/Urinary Tract: Adrenal glands are unremarkable. Benign parapelvic renal cysts for which no further follow-up or characterization is required. Kidneys are otherwise normal, without renal calculi, solid lesion, or hydronephrosis. Bladder is unremarkable. Stomach/Bowel: Stomach is within normal limits. Appendix appears normal. No evidence of bowel wall thickening, distention, or inflammatory changes. Sigmoid colon resection and reanastomosis. Large burden of stool in the distal colon. Vascular/Lymphatic: Aortic atherosclerosis. No enlarged abdominal or pelvic lymph nodes. Reproductive: Hysterectomy. Other: No abdominal wall hernia or abnormality. No ascites. Musculoskeletal: No acute osseous findings. IMPRESSION: 1. Multiple pulmonary nodules have slightly enlarged, consistent with worsened pulmonary metastatic disease. 2. No significant change in size of a anterior to the liver dome measuring 2.0 x 1.3 cm. New, heterogeneous hypodensity of the anterior liver underlying this nodule  delineating a radiation port. Findings are consistent with interval radiation therapy. 3. No evidence of new metastatic disease in the abdomen or pelvis. 4. Hysterectomy and sigmoid colon resection 5. Coronary artery disease. Aortic Atherosclerosis (ICD10-I70.0). Electronically Signed   By: Marolyn JONETTA Jaksch M.D.   On: 01/08/2024 13:43   MR Brain W Wo Contrast Result Date: 12/29/2023 CLINICAL DATA:  Neuro deficit, acute stroke suspected, sudden onset of visual loss in the left eye EXAM: MRI HEAD WITHOUT AND WITH CONTRAST TECHNIQUE: Multiplanar, multiecho pulse sequences of the brain and surrounding structures were obtained without and with intravenous contrast. CONTRAST:  4.9mL GADAVIST  GADOBUTROL  1 MMOL/ML IV SOLN COMPARISON:  None Available. FINDINGS: MRI brain: The brain volume is normal. There are a few small foci of T2 hyperintensity in the cerebral white matter. These do not have restricted diffusion. No enhancement. There is no acute or chronic infarct. The ventricles are normal. No mass lesion. There are normal flow signals in the carotid arteries and basilar artery. No significant bone marrow signal abnormality. No significant abnormality in the  paranasal sinuses or soft tissues. IMPRESSION: No significant abnormality Electronically Signed   By: Nancyann Burns M.D.   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  Lung nodules   ***  Today, I talked to the patient and family about the findings and work-up thus far.  We discussed the natural history of *** and general treatment, highlighting the role of radiotherapy in the management.  We discussed the available radiation techniques, and focused on the details of logistics and delivery.  We reviewed the anticipated acute and late sequelae associated with radiation in this setting.  The  patient was encouraged to ask questions that I answered to the best of my ability. *** A patient consent form was discussed and signed.  We retained a copy for our records.  The patient would like to proceed with radiation and will be scheduled for CT simulation.  PLAN: ***    *** minutes of total time was spent for this patient encounter, including preparation, face-to-face counseling with the patient and coordination of care, physical exam, and documentation of the encounter.   ------------------------------------------------  Lynwood CHARM Nasuti, PhD, MD  This document serves as a record of services personally performed by [name], MD. It was created on his behalf by Reymundo Cartwright, a trained medical scribe. The creation of this record is based on the scribe's personal observations and the provider's statements to them. This document has been checked and approved by the attending provider.

## 2024-01-18 ENCOUNTER — Encounter: Payer: Self-pay | Admitting: Radiation Oncology

## 2024-01-18 ENCOUNTER — Ambulatory Visit
Admission: RE | Admit: 2024-01-18 | Discharge: 2024-01-18 | Disposition: A | Discharge status: Home / Self Care | Source: Ambulatory Visit | Attending: Radiation Oncology | Admitting: Radiation Oncology

## 2024-01-18 ENCOUNTER — Encounter: Payer: Self-pay | Admitting: Hematology & Oncology

## 2024-01-18 VITALS — BP 141/65 | HR 64 | Temp 97.9°F | Resp 18 | Ht 63.0 in | Wt 109.0 lb

## 2024-01-18 DIAGNOSIS — C3412 Malignant neoplasm of upper lobe, left bronchus or lung: Secondary | ICD-10-CM

## 2024-01-19 NOTE — Progress Notes (Signed)
 HI Blu,  Echocardiogram shows normal pumping function at 60 to 65%.  No abnormal motion of the walls of the left ventricle which is good.  There is some slight abnormal relaxation which they described as mild.  There is a little bit of backflow on the mitral valve but nothing concerning the same thing for the aortic valve it is mild.  But overall good blood flow and overall good picture of the heart.

## 2024-01-25 ENCOUNTER — Other Ambulatory Visit: Payer: Self-pay

## 2024-01-27 ENCOUNTER — Other Ambulatory Visit: Payer: Self-pay | Admitting: Hematology & Oncology

## 2024-01-29 ENCOUNTER — Encounter: Payer: Self-pay | Admitting: Hematology & Oncology

## 2024-01-31 ENCOUNTER — Other Ambulatory Visit: Payer: Self-pay

## 2024-02-06 ENCOUNTER — Encounter: Payer: Self-pay | Admitting: Gynecologic Oncology

## 2024-02-07 ENCOUNTER — Telehealth: Payer: Self-pay

## 2024-02-07 LAB — OPHTHALMOLOGY REPORT-SCANNED

## 2024-02-07 NOTE — Telephone Encounter (Signed)
  Spoke with patient. We do not have a signed HIPAA release in chart for this and we did not refer to them  Patient will pick up copy of last note on her way to [facility] eye surgery appt toda

## 2024-02-07 NOTE — Telephone Encounter (Signed)
 Copied from CRM [id number]. Topic: Clinical - Medical Advice >> Feb 07, 2024  9:53 AM Anairis L wrote: Reason for CRM: Morna from [facility] requesting patient last visit notes [phone]  App at 2:20pm.

## 2024-02-07 NOTE — Telephone Encounter (Signed)
 Spoke with patient. We do not have a signed HIPAA release in chart for this and we did not refer to them  Patient will pick up copy of last note on her way to [facility] eye surgery appt today

## 2024-02-08 ENCOUNTER — Ambulatory Visit
Admission: RE | Admit: 2024-02-08 | Discharge: 2024-02-08 | Disposition: A | Discharge status: Home / Self Care | Source: Ambulatory Visit | Attending: Radiation Oncology | Admitting: Radiation Oncology

## 2024-02-08 ENCOUNTER — Inpatient Hospital Stay: Admitting: Gynecologic Oncology

## 2024-02-08 ENCOUNTER — Other Ambulatory Visit: Payer: Self-pay

## 2024-02-08 ENCOUNTER — Encounter: Payer: Self-pay | Admitting: Gynecologic Oncology

## 2024-02-08 VITALS — BP 128/58 | HR 73 | Temp 97.5°F | Resp 18 | Wt 109.0 lb

## 2024-02-08 DIAGNOSIS — C7800 Secondary malignant neoplasm of unspecified lung: Secondary | ICD-10-CM

## 2024-02-08 DIAGNOSIS — Z510 Encounter for antineoplastic radiation therapy: Secondary | ICD-10-CM | POA: Diagnosis not present

## 2024-02-08 DIAGNOSIS — Z8543 Personal history of malignant neoplasm of ovary: Secondary | ICD-10-CM

## 2024-02-08 DIAGNOSIS — C786 Secondary malignant neoplasm of retroperitoneum and peritoneum: Secondary | ICD-10-CM | POA: Diagnosis not present

## 2024-02-08 DIAGNOSIS — C569 Malignant neoplasm of unspecified ovary: Secondary | ICD-10-CM | POA: Insufficient documentation

## 2024-02-08 DIAGNOSIS — R918 Other nonspecific abnormal finding of lung field: Secondary | ICD-10-CM

## 2024-02-08 NOTE — Progress Notes (Signed)
 Specialty Pharmacy Refill Coordination Note  Maria Romero is a 73 y.o. female contacted today regarding refills of specialty medication(s) Olaparib  (LYNPARZA )   Patient requested Delivery   Delivery date: 02/14/24   Verified address: [address] [place] [address]   Medication will be filled on: 02/13/24

## 2024-02-08 NOTE — Progress Notes (Signed)
 Specialty Pharmacy Ongoing Clinical Assessment Note  Maria Romero is a 73 y.o. female who is being followed by the specialty pharmacy service for [facility] Oncology   Patient's specialty medication(s) reviewed today: Olaparib  (LYNPARZA )   Missed doses in the last 4 weeks: 0   Patient/Caregiver did not have any additional questions or concerns.   Therapeutic benefit summary: Patient is achieving benefit   Adverse events/side effects summary: No adverse events/side effects   Patient's therapy is appropriate to: Continue    Goals Addressed             This Visit's Progress    Slow Disease Progression   Improving    Patient is not on track and improving. Patient will maintain adherence, be monitored by provider to determine if a change in treatment plan is warranted, and be evaluated at upcoming provider appointment to assess progress. Per visit on 10/6, recent CT looked ok but slight increase in lung nodules with plans to do radiosurgery as opposed to chemotherapy to treat. Radiosurgery worked well for other site of metastasis.        Follow up: 3 months  [facility]

## 2024-02-08 NOTE — Progress Notes (Signed)
 Gynecologic Oncology Return Clinic Visit  02/08/24  Reason for Visit: surveillance  Treatment History: Oncology History  [name], right (HCC)  11/15/2021 Imaging   CT A/P: 1. Large volume ascites with peritoneal thickening and omental caking in the left abdomen. Imaging features are consistent with metastatic disease. 2. Contiguous with the posterior uterine fundus and extending into the right adnexal space is a poorly marginated heterogeneous 9.6 x 8.8 x 9.4 cm mass. Imaging features are consistent with neoplasm likely of right ovarian etiology. Associated heterogeneously enhancing mass involving the posterior lower uterine segment and cervix extending posteriorly towards the cul-de-sac. This may be a drop metastasis. 3. Soft tissue fullness also noted left adnexal region without normal left ovary by CT. 4. Subtle irregularity of liver contour raising the question of but not definitive for cirrhosis. 5. Small left and tiny right pleural effusions. 6. Aortic Atherosclerosis (ICD10-I70.0).   11/25/2021 Imaging   CT chest 1. Bilateral pleural effusions greatest on the LEFT both small volume but with fissural nodularity along the major fissure in the LEFT chest. Early involvement in the chest is not excluded. Sampling of pleural fluid may be helpful. 2. No adenopathy by size criteria. Tiny pulmonary nodule in the LEFT lower lobe as described. 3. Three-vessel coronary artery disease greatest in LEFT coronary circulation. 4. Reduction in malignant ascites since previous imaging following paracentesis.   12/01/2021 Initial Diagnosis   [name], right (HCC)   12/01/2021 Cancer Staging   Staging form: Ovary, Fallopian Tube, and Primary Peritoneal Carcinoma, AJCC 8th Edition - Clinical stage from 12/01/2021: FIGO Stage IIIC (cT3c, cN1b, cM0) - Signed by Timmy Maude SAUNDERS, MD on 12/01/2021 Stage prefix: Initial diagnosis Histologic grade (G): G2 Histologic grading system: 4 grade  system   12/07/2021 - 06/29/2022 Chemotherapy   Patient is on Treatment Plan : OVARIAN Carboplatin  (AUC 6) + Paclitaxel  (175) q21d X 6 Cycles     01/11/2022 - 06/08/2022 Chemotherapy   Patient is on Treatment Plan : OVARIAN Bevacizumab  q21d      Genetic Testing   Negative genetic testing. No pathogenic variants identified on the [facility] Common Hereditary Cancers+RNA panel. The report date is 01/24/2022.  The Common Hereditary Cancers Panel + RNA offered by [facility] includes sequencing and/or deletion duplication testing of the following 47 genes: APC, ATM, AXIN2, BARD1, BMPR1A, BRCA1, BRCA2, BRIP1, CDH1, CDKN2A (p14ARF), CDKN2A (p16INK4a), CKD4, CHEK2, CTNNA1, DICER1, EPCAM (Deletion/duplication testing only), GREM1 (promoter region deletion/duplication testing only), KIT, MEN1, MLH1, MSH2, MSH3, MSH6, MUTYH, NBN, NF1, NHTL1, PALB2, PDGFRA, PMS2, POLD1, POLE, PTEN, RAD50, RAD51C, RAD51D, SDHB, SDHC, SDHD, SMAD4, SMARCA4. STK11, TP53, TSC1, TSC2, and VHL.  The following genes were evaluated for sequence changes only: SDHA and HOXB13 c.251G>A variant only.   03/08/2022 Imaging   CT C/A/P: 1. Reduction in the omental caking of tumor compared to 11/15/2021, although substantial tumor remains. 2. The right adnexal mass has a volume of 220 cubic cm and appears to be primarily solid, although components may be hemorrhagic. Previous ascites has convinced into a loculated collection of fluid surrounding this adnexal mass and measuring 1400 cc. 3. Questionable bowel wall thickening in the transverse colon, although much of the appearance may be due to nondistention. Correlate with any signs of colitis. 4. Previous right pleural effusion has resolved. Residual small left pleural effusion is nonspecific for transudative or exudative etiology. 5. Other imaging findings of potential clinical significance: Substantial left anterior descending coronary artery atherosclerosis. Grade 1 degenerative  anterolisthesis L4-5. Degenerative facet arthropathy at L4-5  and L5-S1. Old healed right posterior rib fractures. Small calcifications along the pleural margin of the left hemidiaphragm, possibly from prior asbestos exposure or prior pleurodesis. 6. Aortic atherosclerosis.   04/20/2022 Surgery   Diagnostic laparoscopy, exploratory laparotomy with lysis of adhesions for approximately 45 minutes, omentectomy including mobilization of the splenic flexure, radical tumor debulking with en bloc resection of bilateral adnexa, uterus, cervix and rectosigmoid colon, end-to-end colonic reanastomosis, diverting loop ileostomy, cystoscopy   Findings: On EUA, fixed pelvic mass with nodularity appreciated within the posterior cul-de-sac.  On intra-abdominal entry the laparoscope, upper abdominal adhesions limiting assessment of the left upper quadrant.  Large, cystic mass fills the mid abdomen.  On laparotomy, approximately 22 cm cystic mass spans from the deep pelvis to the upper abdomen with a rind.  This lesion appears to be walling off the intra-abdominal ascites.  Multiple loops of small bowel are adherent to the cystic lesion.  There is significant filmy adhesive disease between loops of small bowel, the bowel and cystic lesion, and the small bowel and anterior abdominal wall.  Also numerous less than 1 cm white nodules noted along the small bowel and mesentery (frequently within filmy adhesions), most consistent with treated tumor.  During lysis of adhesions, 2 areas of serosal injury noted along the ileum, oversewn.  Approximately 4-6 cm tumor implant within the greater omentum and omental nodularity extending along the omentum to the splenic flexure.  Lesser sac without disease.  Stomach normal in appearance.,  Adhesions between the liver and the anterior abdominal wall bilaterally, no nodularity appreciated along either the liver or diaphragm.  Appendix itself normal-appearing, appendiceal mesentery minimally  adherent to the right adnexa.  Normal cecum, ascending colon, transverse colon, and descending colon.  Large cystic mass noted to be adherent to the bladder peritoneum and uterus anteriorly within the pelvis.  Left adnexa is somewhat distorted.  Right ovary replaced by an 8 cm cystic mass.  Normal-appearing distal right fallopian fimbria.  Area of the sigmoid colon adherent posterior to the right adnexa, requiring an en bloc resection.  Somewhat woody texture palpated along the rectovaginal septum after reverse hysterectomy performed.  Negative bubble test after rectosigmoid anastomosis.  In the setting of planned additional treatment and recent receipt of bevacizumab , decision made to perform diverting loop ileostomy. Multiple adhesions and nodular implants sent.  If these are negative for malignancy or show treated tumor, then this was an R0 resection.  Otherwise, R1 resection. On cystoscopy, bladder dome intact. Good efflux seen from bilateral ureteral orifices.    04/20/2022 Pathology Results   A. SMALL BOWEL ADHESION, EXCISION: -Fibrous tissue with chronic inflammation, consistent with adhesions  B. SMALL BOWEL NODULE, EXCISION: - Benign fibrotic nodule - Negative for carcinoma  C. UTERUS, CERVIX, BILATERAL TUBES AND OVARIES, RECTOSIGMOID COLON, RESECTION: - Cervix: Benign, nabothian cyst - Endometrium: Benign inactive endometrium - Myometrium: No significant pathologic changes - Ovary: High-grade serous carcinoma - Colon: Metastatic serous carcinoma involving colonic serosa - See oncology table  D. OMENTUM, EXCISION: - Involved by high-grade serous carcinoma  E. RECTAL DONUTS, EXCISION: - Benign colonic mucosa with no significant pathologic changes   ONCOLOGY TABLE:  OVARY or FALLOPIAN TUBE or PRIMARY PERITONEUM: Resection  Procedure: Total hysterectomy, rectosigmoid colon resection, omental excision, adhesiolysis Specimen Integrity: Disrupted Tumor Site: Ovary Tumor Size:  10.5 x 7.5 x 5 cm Histologic Type: serous carcinoma Histologic Grade: High-grade Ovarian Surface Involvement: See comment Fallopian Tube Surface Involvement: Not identified Implants (required for advanced stage serous/seromucinous borderline tumors  only): Invasive implants Other Tissue/ Organ Involvement: Colonic serosa Largest Extrapelvic Peritoneal Focus: 8 cm (omental implant) Peritoneal/Ascitic Fluid Involvement: Present Chemotherapy Response Score (CRS): CRS2 Regional Lymph Nodes: Not applicable       Distant Metastasis:      Distant Site(s) Involved: None Pathologic Stage Classification (pTNM, AJCC 8th Edition): pT3c, pN[not assigned] Ancillary Studies: Can be performed upon request Representative Tumor Block: C11 Comment(s): Assessment of ovarian surface involvement is limited by disrupted and necrotic nature of the specimen. The tumor is located in the right ovary per the surgeon, an additional ovary and fallopian tube are not identified. An additional section is submitted to identify possible fallopian tube.      Interval History: Doing well.  Tolerated radiation without difficulty, had some fatigue but has recovered.  Is meeting with radiation oncologist today to tentatively start SBRT to several lung lesions that have grown on interval imaging.  She scheduled for cataract surgery in December.  Endorses regular bowel function with use of Senokot every other day.  Has occasional pain at the top of her midline incision, not very bothersome, unchanged from prior.  Past Medical/Surgical History: Past Medical History:  Diagnosis Date   Anemia    Complication of anesthesia    Dyspnea    GERD (gastroesophageal reflux disease)    Goals of care, counseling/discussion 12/01/2021   History of radiation therapy    Abdomen-10/02/23-10/16/23- [facility]   Hypercholesterolemia    Hypertension    IBS (irritable bowel syndrome)    Ileostomy in place [facility]    Malignant ascites  (HCC) 11/19/2021   Neuromuscular disorder (HCC)    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   Osteopenia    Osteoporosis    [name], right (HCC) 12/01/2021   Ovarian mass, right 11/19/2021   PONV (postoperative nausea and vomiting)    Toe fracture    lt small toe    Past Surgical History:  Procedure Laterality Date   BOWEL RESECTION N/A 04/20/2022   Procedure: BOWEL RESECTION; DIVERTING ILEOSTOMY;  Surgeon: Viktoria Maria SAUNDERS, MD;  Location: [facility];  Service: Gynecology;  Laterality: N/A;   DEBULKING N/A 04/20/2022   Procedure: TUMOR DEBULKING, OMENTECTOMY;  Surgeon: Viktoria Maria SAUNDERS, MD;  Location: [facility];  Service: Gynecology;  Laterality: N/A;   HYSTERECTOMY ABDOMINAL WITH SALPINGO-OOPHORECTOMY Bilateral 04/20/2022   Procedure: HYSTERECTOMY ABDOMINAL WITH BILATERAL SALPINGO-OOPHORECTOMY;  Surgeon: Viktoria Maria SAUNDERS, MD;  Location: [facility];  Service: Gynecology;  Laterality: Bilateral;   ILEOSTOMY CLOSURE N/A 09/07/2022   Procedure: ILEOSTOMY TAKEDOWN;  Surgeon: Viktoria Maria SAUNDERS, MD;  Location: [facility];  Service: General;  Laterality: N/A;   IR IMAGING GUIDED PORT INSERTION  11/26/2021   IR PARACENTESIS  11/23/2021   IR PARACENTESIS  11/26/2021   LAPAROSCOPY N/A 04/20/2022   Procedure: LAPAROSCOPY DIAGNOSTIC,CYSTO;  Surgeon: Viktoria Maria SAUNDERS, MD;  Location: [facility];  Service: Gynecology;  Laterality: N/A;   lump removal  04/11/1993   axilla right - benign   TONSILLECTOMY     TUBAL LIGATION  04/11/1988    Family History  Problem Relation Age of Onset   Stroke Mother    Hypertension Mother    Hyperlipidemia Mother    Glaucoma Mother    Heart attack Father 35   Diabetes Father    Hypertension Father    Hyperlipidemia Father    Cataracts Father    Breast cancer Cousin    Colon cancer Neg Hx    Ovarian cancer Neg Hx    Endometrial cancer Neg  Hx    Pancreatic cancer Neg Hx    Prostate cancer Neg Hx     Social History   Socioeconomic History   Marital status: Widowed    Spouse  name: Not on file   Number of children: 1   Years of education: 14   Highest education level: Associate degree: occupational, scientist, product/process development, or vocational program  Occupational History   Occupation: retired designer, industrial/product  Tobacco Use   Smoking status: Former    Current packs/day: 0.00    Average packs/day: 1.5 packs/day for 20.0 years (30.0 ttl pk-yrs)    Types: Cigarettes    Start date: 03/20/1970    Quit date: 03/20/1990    Years since quitting: 33.9   Smokeless tobacco: Never   Tobacco comments:    quit 1988  Vaping Use   Vaping status: Never Used  Substance and Sexual Activity   Alcohol use: Not Currently    Alcohol/week: 2.0 standard drinks of alcohol    Types: 2 Glasses of wine per week    Comment: Wine in the evenings   Drug use: No   Sexual activity: Not Currently    Partners: Male  Other Topics Concern   Not on file  Social History Narrative   Lives alone with her dog. Helps with her grandson pick up and volunteering at his school. She enjoys spending time with her family.   Social Drivers of Corporate Investment Banker Strain: Low Risk  (11/02/2023)   Overall Financial Resource Strain (CARDIA)    Difficulty of Paying Living Expenses: Not hard at all  Food Insecurity: No Food Insecurity (01/18/2024)   Hunger Vital Sign    Worried About Running Out of Food in the Last Year: Never true    Ran Out of Food in the Last Year: Never true  Transportation Needs: No Transportation Needs (01/18/2024)   PRAPARE - Administrator, Civil Service (Medical): No    Lack of Transportation (Non-Medical): No  Physical Activity: Sufficiently Active (11/02/2023)   Exercise Vital Sign    Days of Exercise per Week: 5 days    Minutes of Exercise per Session: 30 min  Stress: No Stress Concern Present (11/02/2023)   [employer] of Occupational Health - Occupational Stress Questionnaire    Feeling of Stress: Only a little  Social Connections: Socially Isolated (11/02/2023)   Social  Connection and Isolation Panel    Frequency of Communication with Friends and Family: More than three times a week    Frequency of Social Gatherings with Friends and Family: Three times a week    Attends Religious Services: Never    Active Member of Clubs or Organizations: No    Attends Banker Meetings: Not on file    Marital Status: Widowed    Current Medications:  Current Outpatient Medications:    amLODipine  (NORVASC ) 2.5 MG tablet, TAKE 1 TABLET BY MOUTH DAILY, Disp: 90 tablet, Rfl: 3   amoxicillin  (AMOXIL ) 500 MG tablet, Take 2,000 mg by mouth once. Take 1 hour prior to dental procedure., Disp: , Rfl:    aspirin 81 MG chewable tablet, Chew by mouth daily., Disp: , Rfl:    atorvastatin  (LIPITOR) 40 MG tablet, TAKE 1 TABLET BY MOUTH EVERY NIGHT AT BEDTIME, Disp: 90 tablet, Rfl: 3   Biotin 10000 MCG TABS, Take 10,000 mcg by mouth daily., Disp: , Rfl:    Calcium -Vitamin D -Vitamin K (VIACTIV PO), Take 1 tablet by mouth in the morning and at bedtime., Disp: ,  Rfl:    cholecalciferol (VITAMIN D3) 25 MCG (1000 UNIT) tablet, Take 1,000 Units by mouth daily., Disp: , Rfl:    conjugated estrogens  (PREMARIN ) vaginal cream, Place 1 Applicatorful vaginally 3 (three) times a week., Disp: 42.5 g, Rfl: 3   Cyanocobalamin  (VITAMIN B-12) 5000 MCG TBDP, Take 1 tablet by mouth daily at 6 (six) AM., Disp: , Rfl:    lidocaine -prilocaine  (EMLA ) cream, Apply a dime size of cream to the port-a-cath 1-2 hours prior to access. Cover with [employer]., Disp: 30 g, Rfl: 3   MAGNESIUM -OXIDE 400 (240 Mg) MG tablet, TAKE 1 TABLET BY MOUTH 2 TIMES A DAY, Disp: 60 tablet, Rfl: 5   meclizine  (ANTIVERT ) 25 MG tablet, Take 1 tablet (25 mg total) by mouth 3 (three) times daily as needed for dizziness., Disp: 30 tablet, Rfl: 0   meloxicam  (MOBIC ) 15 MG tablet, One tab PO every 24 hours with a meal for 2 weeks, then once every 24 hours prn pain., Disp: 30 tablet, Rfl: 3   olaparib  (LYNPARZA ) 150 MG tablet, Take 2  tablets (300 mg total) by mouth 2 (two) times daily. Swallow whole. May take with food to decrease nausea and vomiting. Take as instructed per MD, Disp: 120 tablet, Rfl: 3   omeprazole  (PRILOSEC ) 20 MG capsule, TAKE 1 CAPSULE BY MOUTH DAILY, Disp: 90 capsule, Rfl: 2   ondansetron  (ZOFRAN ) 8 MG tablet, Take 1 tablet (8 mg total) by mouth every 8 (eight) hours as needed for nausea or vomiting., Disp: 20 tablet, Rfl: 0   senna (SENOKOT) 8.6 MG tablet, Take 1 tablet by mouth daily., Disp: , Rfl:    TYLENOL  500 MG tablet, Take 500-1,000 mg by mouth every 6 (six) hours as needed for mild pain or headache., Disp: , Rfl:   Current Facility-Administered Medications:    zoledronic  acid (RECLAST ) injection 5 mg, 5 mg, Intravenous, Once,   Review of Systems: + ringing in ears Denies appetite changes, fevers, chills, fatigue, unexplained weight changes. Denies hearing loss, neck lumps or masses, mouth sores or voice changes. Denies cough or wheezing.  Denies shortness of breath. Denies chest pain or palpitations. Denies leg swelling. Denies abdominal distention, pain, blood in stools, constipation, diarrhea, nausea, vomiting, or early satiety. Denies pain with intercourse, dysuria, frequency, hematuria or incontinence. Denies hot flashes, pelvic pain, vaginal bleeding or vaginal discharge.   Denies joint pain, back pain or muscle pain/cramps. Denies itching, rash, or wounds. Denies dizziness, headaches, numbness or seizures. Denies swollen lymph nodes or glands, denies easy bruising or bleeding. Denies anxiety, depression, confusion, or decreased concentration.  Physical Exam: BP (!) 146/55 (BP Location: Right Arm, Patient Position: Sitting)   Pulse 73   Temp (!) 97.5 F (36.4 C) (Oral)   Resp 18   Wt 109 lb (49.4 kg)   SpO2 100%   BMI 19.31 kg/m  General: Alert, oriented, no acute distress. HEENT: Normocephalic, atraumatic, sclera anicteric. Chest: Unlabored breathing on room air.  No wheezes  or rhonchi.  Lungs clear to auscultation bilaterally. Cardiovascular: Regular rate and rhythm, no murmurs or rubs appreciated. Abdomen: Soft, nondistended, nontender.  Well-healed incisions.  Normoactive bowel sounds. Skin: No rashes or lesions noted. Lymph nodes: No submandibular, supraclavicular, or inguinal adenopathy. Extremities: Grossly normal range of motion.  Warm, well perfused.  No edema bilaterally.   GU: Vaginal mucosa is mildly atrophic, vaginal cuff intact without lesions noted.  On bimanual exam, no masses or nodularity.  Findings confirmed on rectovaginal exam.  Laboratory & Radiologic Studies:  Component Ref Range & Units (hover) 3 wk ago 2 mo ago 4 mo ago 5 mo ago 7 mo ago 8 mo ago 10 mo ago  Cancer Antigen (CA) 125 21.4 17.7 CM 20.6 CM 19.0 CM 16.2 CM 13.1 CM 14.4      CT C/A/P 01/08/24: 1. Multiple pulmonary nodules have slightly enlarged, consistent with worsened pulmonary metastatic disease. 2. No significant change in size of a anterior to the liver dome measuring 2.0 x 1.3 cm. New, heterogeneous hypodensity of the anterior liver underlying this nodule delineating a radiation port. Findings are consistent with interval radiation therapy. 3. No evidence of new metastatic disease in the abdomen or pelvis. 4. Hysterectomy and sigmoid colon resection 5. Coronary artery disease.  Assessment & Plan: Maria Romero is a 73 y.o. 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 Ileostomy was reversed in early 09/2022. Germline testing negative. HRD+. Maintenance Olaparib  starting 09/2022. IMRT to implant along right diaphragm concerning for recurrent disease, completed in July 2025.   Patient is doing very well and is NED on exam today.    Reviewed in detail most recent scan which showed stable size of right diaphragm implant that was radiated earlier this year,  growth of several pulmonary nodules.  Plan is for her to start RT to these areas next week.  Discussed with her my concern that given metastatic disease, lack of systemic therapy puts her at risk of new sites of metastasis.  She discussed systemic therapy with her medical oncologist at the last visit but ultimately decision was made to delay starting until next imaging and after the holidays.  Plan is to keep her Olaparib  until that time.  We discussed that typically, with progression, PARP inhibitor would be stopped and systemic treatment initiated.  She will continue to follow with her medical oncologist closely.  I will plan to see her back in 4 months.  Discussed signs and symptoms that should prompt a phone call between visits.   28 minutes of total time was spent for this patient encounter, including preparation, face-to-face counseling with the patient and coordination of care, and documentation of the encounter.  Maria Dollar, MD  Division of Gynecologic Oncology  Department of Obstetrics and Gynecology  [facility]

## 2024-02-08 NOTE — Patient Instructions (Signed)
 It was good to see you today.  Please update me after you finish radiation.  We will get you tentatively scheduled to see me back in 4 months, but I am happy to see you or have a phone visit if you would like to meet before then.    I will be thinking about you and your dog as well as your cataract surgery in December.

## 2024-02-13 ENCOUNTER — Other Ambulatory Visit: Payer: Self-pay

## 2024-02-18 ENCOUNTER — Ambulatory Visit
Admission: RE | Admit: 2024-02-18 | Discharge: 2024-02-18 | Disposition: A | Discharge status: Home / Self Care | Source: Ambulatory Visit | Attending: Radiation Oncology | Admitting: Radiation Oncology

## 2024-02-18 DIAGNOSIS — C561 Malignant neoplasm of right ovary: Secondary | ICD-10-CM | POA: Insufficient documentation

## 2024-02-19 ENCOUNTER — Encounter: Payer: Self-pay | Admitting: Hematology & Oncology

## 2024-02-19 ENCOUNTER — Other Ambulatory Visit (HOSPITAL_COMMUNITY): Payer: Self-pay

## 2024-02-19 ENCOUNTER — Telehealth: Payer: Self-pay | Admitting: Radiation Oncology

## 2024-02-19 ENCOUNTER — Telehealth: Payer: Self-pay

## 2024-02-19 ENCOUNTER — Other Ambulatory Visit: Payer: Self-pay

## 2024-02-19 DIAGNOSIS — C561 Malignant neoplasm of right ovary: Secondary | ICD-10-CM

## 2024-02-19 LAB — RAD ONC ARIA SESSION SUMMARY
Course Elapsed Days: 0
Plan Fractions Treated to Date: 1
Plan Prescribed Dose Per Fraction: 5 Gy
Plan Total Fractions Prescribed: 10
Plan Total Prescribed Dose: 50 Gy
Reference Point Dosage Given to Date: 5 Gy
Reference Point Session Dosage Given: 5 Gy
Session Number: 1

## 2024-02-19 NOTE — Telephone Encounter (Signed)
 Oral Oncology Patient Advocate Encounter  Was successful in securing patient a $3500 grant from [facility] to provide copayment coverage for Lynparza .  This will keep the out of pocket expense at $0.     [employer] ID: [id number]   The billing information is as follows and has been shared with [facility].    RxBin: [id number] PCN: [id number] Member ID: [id number] Group ID: [id number] Dates of Eligibility: 03/18/24 through 03/17/25  Fund:  Ovarian Cancer - Medicare Access   Charlott Hamilton,  CPhT-Adv  she/her/hers [facility]  [facility] Pharmacy Technician Patient Advocate Specialist III [facility] Phone: [phone]  Fax: [phone] [email or web address]

## 2024-02-19 NOTE — Telephone Encounter (Signed)
 11/10 patient left voicemail to be setup for more days for transportation for her treament appts.  Email forward to [place] and copied [place], so they aware.

## 2024-02-20 ENCOUNTER — Inpatient Hospital Stay: Attending: Hematology & Oncology

## 2024-02-20 ENCOUNTER — Ambulatory Visit
Admission: RE | Admit: 2024-02-20 | Discharge: 2024-02-20 | Disposition: A | Discharge status: Home / Self Care | Source: Ambulatory Visit | Attending: Radiation Oncology | Admitting: Radiation Oncology

## 2024-02-20 ENCOUNTER — Inpatient Hospital Stay: Admitting: Hematology & Oncology

## 2024-02-20 ENCOUNTER — Inpatient Hospital Stay

## 2024-02-20 ENCOUNTER — Other Ambulatory Visit: Payer: Self-pay

## 2024-02-20 ENCOUNTER — Encounter: Payer: Self-pay | Admitting: Hematology & Oncology

## 2024-02-20 ENCOUNTER — Ambulatory Visit: Admitting: Radiation Oncology

## 2024-02-20 VITALS — BP 122/60 | HR 65 | Temp 97.4°F | Resp 20 | Ht 63.0 in | Wt 109.8 lb

## 2024-02-20 DIAGNOSIS — C561 Malignant neoplasm of right ovary: Secondary | ICD-10-CM | POA: Insufficient documentation

## 2024-02-20 DIAGNOSIS — Z90710 Acquired absence of both cervix and uterus: Secondary | ICD-10-CM | POA: Insufficient documentation

## 2024-02-20 DIAGNOSIS — C7800 Secondary malignant neoplasm of unspecified lung: Secondary | ICD-10-CM | POA: Insufficient documentation

## 2024-02-20 DIAGNOSIS — Z90722 Acquired absence of ovaries, bilateral: Secondary | ICD-10-CM | POA: Insufficient documentation

## 2024-02-20 DIAGNOSIS — C786 Secondary malignant neoplasm of retroperitoneum and peritoneum: Secondary | ICD-10-CM | POA: Insufficient documentation

## 2024-02-20 LAB — CBC WITH DIFFERENTIAL (CANCER CENTER ONLY)
Abs Immature Granulocytes: 0.01 K/uL (ref 0.00–0.07)
Basophils Absolute: 0 K/uL (ref 0.0–0.1)
Basophils Relative: 1 %
Eosinophils Absolute: 0 K/uL (ref 0.0–0.5)
Eosinophils Relative: 1 %
HCT: 31.4 % — ABNORMAL LOW (ref 36.0–46.0)
Hemoglobin: 11.1 g/dL — ABNORMAL LOW (ref 12.0–15.0)
Immature Granulocytes: 0 %
Lymphocytes Relative: 16 %
Lymphs Abs: 0.5 K/uL — ABNORMAL LOW (ref 0.7–4.0)
MCH: 43.5 pg — ABNORMAL HIGH (ref 26.0–34.0)
MCHC: 35.4 g/dL (ref 30.0–36.0)
MCV: 123.1 fL — ABNORMAL HIGH (ref 80.0–100.0)
Monocytes Absolute: 0.4 K/uL (ref 0.1–1.0)
Monocytes Relative: 12 %
Neutro Abs: 2.1 K/uL (ref 1.7–7.7)
Neutrophils Relative %: 70 %
Platelet Count: 180 K/uL (ref 150–400)
RBC: 2.55 MIL/uL — ABNORMAL LOW (ref 3.87–5.11)
RDW: 13.9 % (ref 11.5–15.5)
WBC Count: 3 K/uL — ABNORMAL LOW (ref 4.0–10.5)
nRBC: 0 % (ref 0.0–0.2)

## 2024-02-20 LAB — CMP (CANCER CENTER ONLY)
ALT: 24 U/L (ref 0–44)
AST: 32 U/L (ref 15–41)
Albumin: 4.5 g/dL (ref 3.5–5.0)
Alkaline Phosphatase: 76 U/L (ref 38–126)
Anion gap: 13 (ref 5–15)
BUN: 18 mg/dL (ref 8–23)
CO2: 24 mmol/L (ref 22–32)
Calcium: 10.1 mg/dL (ref 8.9–10.3)
Chloride: 104 mmol/L (ref 98–111)
Creatinine: 0.99 mg/dL (ref 0.44–1.00)
GFR, Estimated: 59 mL/min — ABNORMAL LOW (ref >60)
Glucose, Bld: 98 mg/dL (ref 70–99)
Potassium: 4.1 mmol/L (ref 3.5–5.1)
Sodium: 140 mmol/L (ref 135–145)
Total Bilirubin: 0.8 mg/dL (ref 0.0–1.2)
Total Protein: 6.5 g/dL (ref 6.5–8.1)

## 2024-02-20 LAB — RAD ONC ARIA SESSION SUMMARY
Course Elapsed Days: 1
Plan Fractions Treated to Date: 2
Plan Prescribed Dose Per Fraction: 5 Gy
Plan Total Fractions Prescribed: 10
Plan Total Prescribed Dose: 50 Gy
Reference Point Dosage Given to Date: 10 Gy
Reference Point Session Dosage Given: 5 Gy
Session Number: 2

## 2024-02-20 LAB — MAGNESIUM: Magnesium: 1.7 mg/dL (ref 1.7–2.4)

## 2024-02-20 NOTE — Patient Instructions (Signed)

## 2024-02-20 NOTE — Progress Notes (Signed)
 Given his minimal Hematology and Oncology Follow Up Visit  Maria Romero [id number] 11-Mar-1951 73 y.o. 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 Year --next dose 06/2024 SBRT --completed on 10/16/2023     Interim History:  Maria Romero is back for follow-up.  She recently had a CT scan that was done.  This was done on 01/08/2024.  Everything looked okay although looks like there was some slight enlargement of a couple pulmonary nodules.  In the left upper lobe nodule measures 8 mm.  She is going to have radiosurgery for the these nodule on the left side.  Apparently, she has 4 nodules.  She will start tomorrow.  She will have 10 treatments.  Where she had the stereotactic radiosurgery, there is no change in the nodule measuring 2 x 1.3 cm.  Her last CA 125 was slightly more elevated at 21.    Otherwise, she feels okay.  She has had no cough or shortness of breath.  She has had no nausea or vomiting.  She has had no change in bowel or bladder habits.  Her weight is doing okay.  She has had no leg swelling.  She has had no bleeding.  Has been no rashes.  She has had no headache.  Overall, I will say her performance status is probably ECOG 0.    Wt Readings from Last 3 Encounters:  02/20/24 109 lb 12.8 oz (49.8 kg)  02/08/24 109 lb (49.4 kg)  01/18/24 109 lb (49.4 kg)    Medications:  Current Outpatient Medications:    amLODipine  (NORVASC ) 2.5 MG tablet, TAKE 1 TABLET BY MOUTH DAILY, Disp: 90 tablet, Rfl: 3   aspirin 81 MG chewable tablet, Chew by mouth daily.,  Disp: , Rfl:    Biotin 89999 MCG TABS, Take 10,000 mcg by mouth daily., Disp: , Rfl:    Calcium -Vitamin D -Vitamin K (VIACTIV PO), Take 1 tablet by mouth in the morning and at bedtime., Disp: , Rfl:    cholecalciferol (VITAMIN D3) 25 MCG (1000 UNIT) tablet, Take 1,000 Units by mouth daily., Disp: , Rfl:    Cyanocobalamin  (VITAMIN B-12) 5000 MCG TBDP, Take 1 tablet by mouth daily at 6 (six) AM., Disp: , Rfl:    lidocaine -prilocaine  (EMLA ) cream, Apply a dime size of cream to the port-a-cath 1-2 hours prior to access. Cover with [employer]., Disp: 30 g, Rfl: 3   MAGNESIUM -OXIDE 400 (240 Mg) MG tablet, TAKE 1 TABLET BY MOUTH 2 TIMES A DAY, Disp: 60 tablet, Rfl: 5   meclizine  (ANTIVERT ) 25 MG tablet, Take 1 tablet (25 mg total) by mouth 3 (three) times daily as needed for dizziness., Disp: 30 tablet, Rfl: 0   olaparib  (LYNPARZA ) 150 MG tablet, Take 2 tablets (300 mg total) by mouth 2 (two) times daily. Swallow whole. May take with food to decrease nausea and vomiting. Take as instructed per MD, Disp: 120 tablet, Rfl: 3   omeprazole  (PRILOSEC ) 20 MG capsule, TAKE 1 CAPSULE BY MOUTH DAILY, Disp: 90 capsule, Rfl: 2   ondansetron  (ZOFRAN ) 8 MG tablet, Take 1 tablet (8 mg total) by  mouth every 8 (eight) hours as needed for nausea or vomiting., Disp: 20 tablet, Rfl: 0   senna (SENOKOT) 8.6 MG tablet, Take 1 tablet by mouth daily. (Patient taking differently: Take 1 tablet by mouth every other day.), Disp: , Rfl:    TYLENOL  500 MG tablet, Take 500-1,000 mg by mouth every 6 (six) hours as needed for mild pain or headache., Disp: , Rfl:    amoxicillin  (AMOXIL ) 500 MG tablet, Take 2,000 mg by mouth once. Take 1 hour prior to dental procedure. (Patient not taking: Reported on 02/20/2024), Disp: , Rfl:    atorvastatin  (LIPITOR) 40 MG tablet, TAKE 1 TABLET BY MOUTH EVERY NIGHT AT BEDTIME, Disp: 90 tablet, Rfl: 3   conjugated estrogens  (PREMARIN ) vaginal cream, Place 1 Applicatorful vaginally 3 (three) times a week.  (Patient not taking: Reported on 02/20/2024), Disp: 42.5 g, Rfl: 3   meloxicam  (MOBIC ) 15 MG tablet, One tab PO every 24 hours with a meal for 2 weeks, then once every 24 hours prn pain. (Patient not taking: Reported on 02/20/2024), Disp: 30 tablet, Rfl: 3  Current Facility-Administered Medications:    zoledronic  acid (RECLAST ) injection 5 mg, 5 mg, Intravenous, Once,   Allergies:  Allergies  Allergen Reactions   Fluvastatin Sodium Other (See Comments)    Myalgias   Sudafed [Pseudoephedrine Hcl] Other (See Comments)    Makes her feel high    Past Medical History, Surgical history, Social history, and Family History were reviewed and updated.  Review of Systems: Review of Systems  Constitutional: Negative.   HENT:  Negative.    Eyes: Negative.   Respiratory: Negative.    Cardiovascular: Negative.   Gastrointestinal:  Negative for abdominal pain.  Genitourinary:  Negative for pelvic pain.   Musculoskeletal: Negative.   Skin: Negative.   Neurological: Negative.   Hematological: Negative.   Psychiatric/Behavioral: Negative.      Physical Exam:  height is 5' 3 (1.6 m) and weight is 109 lb 12.8 oz (49.8 kg). Her oral temperature is 97.4 F (36.3 C) (abnormal). Her blood pressure is 122/60 and her pulse is 65. Her respiration is 20 and oxygen saturation is 100%.   Wt Readings from Last 3 Encounters:  02/20/24 109 lb 12.8 oz (49.8 kg)  02/08/24 109 lb (49.4 kg)  01/18/24 109 lb (49.4 kg)    Physical Exam Vitals reviewed.  HENT:     Head: Normocephalic and atraumatic.  Eyes:     Pupils: Pupils are equal, round, and reactive to light.  Cardiovascular:     Rate and Rhythm: Normal rate and regular rhythm.     Heart sounds: Normal heart sounds.  Pulmonary:     Effort: Pulmonary effort is normal.     Breath sounds: Normal breath sounds.  Chest:    Abdominal:     General: Bowel sounds are normal.     Palpations: Abdomen is soft.     Comments: Abdominal exam is soft.   She has good bowel sounds.  There is no fluid wave.  She has a well-healed laparotomy scar.  She has a scar where she had the colostomy.  There is no palpable abdominal mass.  There is no palpable liver or spleen tip.    Musculoskeletal:        General: No tenderness or deformity. Normal range of motion.     Cervical back: Normal range of motion.  Lymphadenopathy:     Cervical: No cervical adenopathy.  Skin:    General: Skin is warm and  dry.     Findings: No erythema or rash.  Neurological:     Mental Status: She is alert and oriented to person, place, and time.  Psychiatric:        Behavior: Behavior normal.        Thought Content: Thought content normal.        Judgment: Judgment normal.      Lab Results  Component Value Date   WBC 3.0 (L) 02/20/2024   HGB 11.1 (L) 02/20/2024   HCT 31.4 (L) 02/20/2024   MCV 123.1 (H) 02/20/2024   PLT 180 02/20/2024     Chemistry      Component Value Date/Time   NA 140 02/20/2024 0951   K 4.1 02/20/2024 0951   CL 104 02/20/2024 0951   CO2 24 02/20/2024 0951   BUN 18 02/20/2024 0951   CREATININE 0.99 02/20/2024 0951   CREATININE 0.93 11/03/2021 0000      Component Value Date/Time   CALCIUM  10.1 02/20/2024 0951   ALKPHOS 76 02/20/2024 0951   AST 32 02/20/2024 0951   ALT 24 02/20/2024 0951   BILITOT 0.8 02/20/2024 0951        Impression and Plan: Ms. Ambriz is a very charming 73 year old white female.  She had neoadjuvant chemotherapy.  She really had a tough time with neoadjuvant chemotherapy.  She subsequently underwent surgical resection.  She did still have active disease.    We then gave her some additional chemotherapy.  By her CA-125, she was then remission.  I think that her colostomy reversal certainly confirms that she was in remission.  I do think that the radiosurgery helped for the peritoneal metastasis.  Now, I have to see if she can have radiosurgery for the lung metastasis.  She will start this tomorrow.   Hopefully, we will see that she responds.  I want to try to hold off on systemic therapy as long as possible.  We have I will get her back in about 6 weeks.  At that time, she should finish her radiosurgery and then we can see when to do another set of scans.   Maude JONELLE Crease, MD 11/11/202510:59 AM

## 2024-02-21 ENCOUNTER — Other Ambulatory Visit: Payer: Self-pay

## 2024-02-21 ENCOUNTER — Ambulatory Visit: Admitting: Radiation Oncology

## 2024-02-21 ENCOUNTER — Ambulatory Visit
Admission: RE | Admit: 2024-02-21 | Discharge: 2024-02-21 | Disposition: A | Discharge status: Home / Self Care | Source: Ambulatory Visit | Attending: Radiation Oncology | Admitting: Radiation Oncology

## 2024-02-21 LAB — RAD ONC ARIA SESSION SUMMARY
Course Elapsed Days: 2
Plan Fractions Treated to Date: 3
Plan Prescribed Dose Per Fraction: 5 Gy
Plan Total Fractions Prescribed: 10
Plan Total Prescribed Dose: 50 Gy
Reference Point Dosage Given to Date: 15 Gy
Reference Point Session Dosage Given: 5 Gy
Session Number: 3

## 2024-02-22 ENCOUNTER — Ambulatory Visit: Admitting: Radiation Oncology

## 2024-02-22 ENCOUNTER — Other Ambulatory Visit: Payer: Self-pay

## 2024-02-22 ENCOUNTER — Ambulatory Visit
Admission: RE | Admit: 2024-02-22 | Discharge: 2024-02-22 | Attending: Radiation Oncology | Admitting: Radiation Oncology

## 2024-02-22 LAB — RAD ONC ARIA SESSION SUMMARY
Course Elapsed Days: 3
Plan Fractions Treated to Date: 4
Plan Prescribed Dose Per Fraction: 5 Gy
Plan Total Fractions Prescribed: 10
Plan Total Prescribed Dose: 50 Gy
Reference Point Dosage Given to Date: 20 Gy
Reference Point Session Dosage Given: 5 Gy
Session Number: 4

## 2024-02-22 LAB — CA 125: Cancer Antigen (CA) 125: 30.3 U/mL (ref 0.0–38.1)

## 2024-02-23 ENCOUNTER — Ambulatory Visit: Admitting: Radiation Oncology

## 2024-02-23 ENCOUNTER — Ambulatory Visit
Admission: RE | Admit: 2024-02-23 | Discharge: 2024-02-23 | Disposition: A | Discharge status: Home / Self Care | Source: Ambulatory Visit | Attending: Radiation Oncology | Admitting: Radiation Oncology

## 2024-02-23 ENCOUNTER — Other Ambulatory Visit: Payer: Self-pay

## 2024-02-23 LAB — RAD ONC ARIA SESSION SUMMARY
Course Elapsed Days: 4
Plan Fractions Treated to Date: 5
Plan Prescribed Dose Per Fraction: 5 Gy
Plan Total Fractions Prescribed: 10
Plan Total Prescribed Dose: 50 Gy
Reference Point Dosage Given to Date: 25 Gy
Reference Point Session Dosage Given: 5 Gy
Session Number: 5

## 2024-02-26 ENCOUNTER — Other Ambulatory Visit: Payer: Self-pay

## 2024-02-26 ENCOUNTER — Ambulatory Visit
Admission: RE | Admit: 2024-02-26 | Discharge: 2024-02-26 | Disposition: A | Discharge status: Home / Self Care | Source: Ambulatory Visit | Attending: Radiation Oncology | Admitting: Radiation Oncology

## 2024-02-26 LAB — RAD ONC ARIA SESSION SUMMARY
Course Elapsed Days: 7
Plan Fractions Treated to Date: 6
Plan Prescribed Dose Per Fraction: 5 Gy
Plan Total Fractions Prescribed: 10
Plan Total Prescribed Dose: 50 Gy
Reference Point Dosage Given to Date: 30 Gy
Reference Point Session Dosage Given: 5 Gy
Session Number: 6

## 2024-02-27 ENCOUNTER — Other Ambulatory Visit: Payer: Self-pay

## 2024-02-27 ENCOUNTER — Ambulatory Visit
Admission: RE | Admit: 2024-02-27 | Discharge: 2024-02-27 | Disposition: A | Discharge status: Home / Self Care | Source: Ambulatory Visit | Attending: Radiation Oncology | Admitting: Radiation Oncology

## 2024-02-27 ENCOUNTER — Ambulatory Visit: Admitting: Radiation Oncology

## 2024-02-27 LAB — RAD ONC ARIA SESSION SUMMARY
Course Elapsed Days: 8
Plan Fractions Treated to Date: 7
Plan Prescribed Dose Per Fraction: 5 Gy
Plan Total Fractions Prescribed: 10
Plan Total Prescribed Dose: 50 Gy
Reference Point Dosage Given to Date: 35 Gy
Reference Point Session Dosage Given: 5 Gy
Session Number: 7

## 2024-02-28 ENCOUNTER — Ambulatory Visit
Admission: RE | Admit: 2024-02-28 | Discharge: 2024-02-28 | Disposition: A | Source: Ambulatory Visit | Attending: Radiation Oncology | Admitting: Radiation Oncology

## 2024-02-28 ENCOUNTER — Other Ambulatory Visit: Payer: Self-pay

## 2024-02-28 LAB — RAD ONC ARIA SESSION SUMMARY
Course Elapsed Days: 9
Plan Fractions Treated to Date: 8
Plan Prescribed Dose Per Fraction: 5 Gy
Plan Total Fractions Prescribed: 10
Plan Total Prescribed Dose: 50 Gy
Reference Point Dosage Given to Date: 40 Gy
Reference Point Session Dosage Given: 5 Gy
Session Number: 8

## 2024-02-29 ENCOUNTER — Ambulatory Visit: Admitting: Radiation Oncology

## 2024-02-29 ENCOUNTER — Ambulatory Visit
Admission: RE | Admit: 2024-02-29 | Discharge: 2024-02-29 | Disposition: A | Discharge status: Home / Self Care | Source: Ambulatory Visit | Attending: Radiation Oncology | Admitting: Radiation Oncology

## 2024-02-29 ENCOUNTER — Other Ambulatory Visit: Payer: Self-pay

## 2024-02-29 LAB — RAD ONC ARIA SESSION SUMMARY
Course Elapsed Days: 10
Plan Fractions Treated to Date: 9
Plan Prescribed Dose Per Fraction: 5 Gy
Plan Total Fractions Prescribed: 10
Plan Total Prescribed Dose: 50 Gy
Reference Point Dosage Given to Date: 45 Gy
Reference Point Session Dosage Given: 5 Gy
Session Number: 9

## 2024-03-01 ENCOUNTER — Other Ambulatory Visit: Payer: Self-pay

## 2024-03-01 ENCOUNTER — Ambulatory Visit
Admission: RE | Admit: 2024-03-01 | Discharge: 2024-03-01 | Disposition: A | Discharge status: Home / Self Care | Source: Ambulatory Visit | Attending: Radiation Oncology | Admitting: Radiation Oncology

## 2024-03-01 LAB — RAD ONC ARIA SESSION SUMMARY
Course Elapsed Days: 11
Plan Fractions Treated to Date: 10
Plan Prescribed Dose Per Fraction: 5 Gy
Plan Total Fractions Prescribed: 10
Plan Total Prescribed Dose: 50 Gy
Reference Point Dosage Given to Date: 50 Gy
Reference Point Session Dosage Given: 5 Gy
Session Number: 10

## 2024-03-03 NOTE — Radiation Completion Notes (Addendum)
°  Radiation Oncology         [phone] ________________________________  Name: Maria Romero MRN: [id number]  Date of Service: 03/01/2024  DOB: February 05, 1951  End of Treatment Note   Diagnosis: Left lung metastasis from stage IIIC ([id number]) serous adenocarcinoma of the right ovary  Intent: Palliative     ==========DELIVERED PLANS==========  First Treatment Date: 2024-02-19 Last Treatment Date: 2024-03-01   Plan Name: Lung_L Site: Lung, Left Technique: 3D Mode: Photon Dose Per Fraction: 5 Gy Prescribed Dose (Delivered / Prescribed): 50 Gy / 50 Gy Prescribed Fxs (Delivered / Prescribed): 10 / 10     ====================================   The patient tolerated radiation. She developed a mild cough and minimal fatigue throughout her treatment.    The patient will return in one month and will continue follow up with Dr. Timmy as well.      Ronita Due, PA-C

## 2024-03-03 NOTE — Radiation Completion Notes (Incomplete Revision)
 Patient Name: Maria Romero, Maria Romero MRN: [id number] Date of Birth: Aug 02, 1950 Referring Physician: MAUDE CREASE, M.D. Date of Service: 2024-03-03 Radiation Oncologist: Signe Nasuti, M.D. [facility]                             RADIATION ONCOLOGY END OF TREATMENT NOTE     Diagnosis: C34.12 Malignant neoplasm of upper lobe, left bronchus or lung Staging on 2021-12-01: [name], right (HCC) T=cT3c, N=cN1b, M=cM0 Intent: Palliative     ==========DELIVERED PLANS==========  First Treatment Date: 2024-02-19 Last Treatment Date: 2024-03-01   Plan Name: Lung_L Site: Lung, Left Technique: 3D Mode: Photon Dose Per Fraction: 5 Gy Prescribed Dose (Delivered / Prescribed): 50 Gy / 50 Gy Prescribed Fxs (Delivered / Prescribed): 10 / 10     ==========ON TREATMENT VISIT DATES========== 2024-02-20, 2024-02-27     ==========UPCOMING VISITS========== 05/08/2024 [facility] CARE [facility] OFFICE VISIT Alvan Dorothyann BIRCH, MD  04/01/2024 [facility] EST PT 15 Crease Maude SAUNDERS, MD  04/01/2024 [facility] PORT FLUSH W/LAB [facility] IN[facility] NURSE  04/01/2024 [facility] LAB [facility]  03/28/2024 [facility] ON[facility] FOLLOW UP 15 Nasuti Agent, MD        ==========APPENDIX - ON TREATMENT VISIT NOTES==========   See weekly On Treatment Notes in Epic for details in the Media tab (listed as Progress notes on the On Treatment Visit Dates listed above).

## 2024-03-06 ENCOUNTER — Other Ambulatory Visit: Payer: Self-pay | Admitting: Hematology & Oncology

## 2024-03-06 ENCOUNTER — Other Ambulatory Visit: Payer: Self-pay

## 2024-03-06 DIAGNOSIS — C561 Malignant neoplasm of right ovary: Secondary | ICD-10-CM

## 2024-03-06 MED ORDER — OLAPARIB 150 MG PO TABS
300.0000 mg | ORAL_TABLET | Freq: Two times a day (BID) | ORAL | 3 refills | Status: AC
  Filled 2024-03-06 – 2024-03-11 (×2): qty 120, 30d supply, fill #0
  Filled 2024-04-10 – 2024-04-15 (×2): qty 120, 30d supply, fill #1
  Filled 2024-05-16: qty 120, 30d supply, fill #2

## 2024-03-11 ENCOUNTER — Other Ambulatory Visit: Payer: Self-pay

## 2024-03-11 ENCOUNTER — Other Ambulatory Visit (HOSPITAL_COMMUNITY): Payer: Self-pay

## 2024-03-11 NOTE — Progress Notes (Signed)
 Specialty Pharmacy Refill Coordination Note  Maria Romero is a 73 y.o. female contacted today regarding refills of specialty medication(s) Olaparib  (LYNPARZA )   Patient requested Delivery   Delivery date: 03/21/24   Verified address: [address] [place] [address]   Medication will be filled on: 03/20/24

## 2024-03-19 NOTE — Anesthesia Postprocedure Evaluation (Signed)
°  Patient: Romi Silzenko Smoots Procedures: RIGHT EYE PHACO WITH IOL  Anesthesia type: MAC  Anesthesia Post Evaluation  Patient location during evaluation: PACU Patient participation: complete - patient participated Level of consciousness: awake Pain management: adequate Multimodal analgesia pain management approach Airway patency: patent Cardiovascular status: acceptable and hemodynamically stable Respiratory status: acceptable Hydration status: stable Vitals: stable PONV: nausea and vomiting controlled Regional Anesthesia: no block performed    BP: (!) 85/53 (PATIENT STATES THAT SHE NORMALLY HAS A LOW bp) (03/19/24 1101) Heart Rate: 91 (03/19/24 1101) Resp: 18 (03/19/24 1101) Temp: 97.8 F (36.6 C) (03/19/24 1101) SpO2: 100 % (03/19/24 1101)         Notable Events:    No Anesthesia notable events documented.

## 2024-03-22 ENCOUNTER — Other Ambulatory Visit (HOSPITAL_COMMUNITY): Payer: Self-pay

## 2024-03-22 ENCOUNTER — Other Ambulatory Visit: Payer: Self-pay

## 2024-03-22 NOTE — Progress Notes (Signed)
 Lynparza  shipment did not occur on the originally scheduled date. A MyChart message was sent to the patient with the updated delivery date. As of 03/11/2024, the patient reported having approximately 2-3 weeks of medication supply on hand.

## 2024-03-27 ENCOUNTER — Encounter: Payer: Self-pay | Admitting: Radiation Oncology

## 2024-03-27 NOTE — Progress Notes (Signed)
 Radiation Oncology         [phone] ________________________________  Name: Maria Romero MRN: [id number]  Date: 03/28/2024  DOB: [birth year]  Follow-Up Visit Note  CC: Alvan Dorothyann BIRCH, MD  Alvan Dorothyann BIRCH, MD  No diagnosis found.  Diagnosis:  Left lung metastasis from stage IIIC ([id number]) serous adenocarcinoma of the right ovary   Interval Since Last Radiation:  27 days  Intent: palliative  First Treatment Date: 2024-02-19 Last Treatment Date: 2024-03-01   Plan Name: Lung_L Site: Lung, Left Technique: 3D Mode: Photon Dose Per Fraction: 5 Gy Prescribed Dose (Delivered / Prescribed): 50 Gy / 50 Gy Prescribed Fxs (Delivered / Prescribed): 10 / 10  Narrative:  The patient returns today for routine follow-up. She was last seen in office on 01/18/24 for a consultation visit. Since then, patient completed her radiation treatment which she tolerated quite well. Patient did however endorse experiencing a mild cough and minimal fatigue throughout her treatment.     In the interval since she was last seen, she presented for a follow up visit with Dr. Viktoria on 02/08/24 during which she was noted NED on exam. She presented for a follow up with Dr. Timmy on 02/20/24 during which they discussed the radiosurgery she will undergo for her lung lesions.      No other significant oncologic interval history since the patient was last seen.                             Allergies:  is allergic to fluvastatin sodium and sudafed [pseudoephedrine hcl].  Meds: Current Outpatient Medications  Medication Sig Dispense Refill   amLODipine  (NORVASC ) 2.5 MG tablet TAKE 1 TABLET BY MOUTH DAILY 90 tablet 3   amoxicillin  (AMOXIL ) 500 MG tablet Take 2,000 mg by mouth once. Take 1 hour prior to dental procedure. (Patient not taking: Reported on 02/20/2024)     aspirin 81 MG chewable tablet Chew by mouth daily.     atorvastatin  (LIPITOR) 40 MG tablet TAKE 1 TABLET BY MOUTH EVERY NIGHT AT  BEDTIME 90 tablet 3   Biotin 89999 MCG TABS Take 10,000 mcg by mouth daily.     Calcium -Vitamin D -Vitamin K (VIACTIV PO) Take 1 tablet by mouth in the morning and at bedtime.     cholecalciferol (VITAMIN D3) 25 MCG (1000 UNIT) tablet Take 1,000 Units by mouth daily.     conjugated estrogens  (PREMARIN ) vaginal cream Place 1 Applicatorful vaginally 3 (three) times a week. (Patient not taking: Reported on 02/20/2024) 42.5 g 3   Cyanocobalamin  (VITAMIN B-12) 5000 MCG TBDP Take 1 tablet by mouth daily at 6 (six) AM.     lidocaine -prilocaine  (EMLA ) cream Apply a dime size of cream to the port-a-cath 1-2 hours prior to access. Cover with [employer]. 30 g 3   MAGNESIUM -OXIDE 400 (240 Mg) MG tablet TAKE 1 TABLET BY MOUTH 2 TIMES A DAY 60 tablet 5   meclizine  (ANTIVERT ) 25 MG tablet Take 1 tablet (25 mg total) by mouth 3 (three) times daily as needed for dizziness. 30 tablet 0   meloxicam  (MOBIC ) 15 MG tablet One tab PO every 24 hours with a meal for 2 weeks, then once every 24 hours prn pain. (Patient not taking: Reported on 02/20/2024) 30 tablet 3   olaparib  (LYNPARZA ) 150 MG tablet Take 2 tablets (300 mg total) by mouth 2 (two) times daily. Swallow whole. May take with food to decrease nausea and vomiting. Take as  instructed per MD 120 tablet 3   omeprazole  (PRILOSEC ) 20 MG capsule TAKE 1 CAPSULE BY MOUTH DAILY 90 capsule 2   ondansetron  (ZOFRAN ) 8 MG tablet Take 1 tablet (8 mg total) by mouth every 8 (eight) hours as needed for nausea or vomiting. 20 tablet 0   senna (SENOKOT) 8.6 MG tablet Take 1 tablet by mouth daily. (Patient taking differently: Take 1 tablet by mouth every other day.)     TYLENOL  500 MG tablet Take 500-1,000 mg by mouth every 6 (six) hours as needed for mild pain or headache.     Current Facility-Administered Medications  Medication Dose Route Frequency Provider Last Rate Last Admin   zoledronic  acid (RECLAST ) injection 5 mg  5 mg Intravenous Once         Physical Findings: The  patient is in no acute distress. Patient is alert and oriented.  vitals were not taken for this visit. .  No significant changes. Lungs are clear to auscultation bilaterally. Heart has regular rate and rhythm. No palpable cervical, supraclavicular, or axillary adenopathy. Abdomen soft, non-tender, normal bowel sounds.   Lab Findings: Lab Results  Component Value Date   WBC 3.0 (L) 02/20/2024   HGB 11.1 (L) 02/20/2024   HCT 31.4 (L) 02/20/2024   MCV 123.1 (H) 02/20/2024   PLT 180 02/20/2024    Radiographic Findings: No results found.  Impression:   Left lung metastasis from stage IIIC ([id number]) serous adenocarcinoma of the right ovary   The patient is recovering from the effects of radiation.  ***  Plan:  ***   *** minutes of total time was spent for this patient encounter, including preparation, face-to-face counseling with the patient and coordination of care, physical exam, and documentation of the encounter. ____________________________________  Lynwood CHARM Nasuti, PhD, MD  This document serves as a record of services personally performed by [name], MD. It was created on his behalf by Reymundo Cartwright, a trained medical scribe. The creation of this record is based on the scribe's personal observations and the provider's statements to them. This document has been checked and approved by the attending provider.

## 2024-03-28 ENCOUNTER — Ambulatory Visit
Admission: RE | Admit: 2024-03-28 | Discharge: 2024-03-28 | Attending: Radiation Oncology | Admitting: Radiation Oncology

## 2024-03-28 ENCOUNTER — Encounter: Payer: Self-pay | Admitting: Radiation Oncology

## 2024-03-28 VITALS — BP 132/68 | HR 71 | Temp 97.8°F | Resp 18 | Ht 63.0 in | Wt 109.0 lb

## 2024-03-28 DIAGNOSIS — C561 Malignant neoplasm of right ovary: Secondary | ICD-10-CM

## 2024-03-28 NOTE — Progress Notes (Signed)
 Maria Romero is here today for follow up post radiation to the lung.  Lung Side: left,patient completed treatment on 03/01/24.  Does the patient complain of any of the following: Pain: No Shortness of breath w/wo exertion: No Cough: No Hemoptysis: No Pain with swallowing: No Swallowing/choking concerns: yes on saliva  Appetite: Good  Energy Level: good  Post radiation skin Changes: No    Additional comments if applicable:    BP 132/68 (BP Location: Right Arm, Patient Position: Sitting, Cuff Size: Normal)   Pulse 71   Temp 97.8 F (36.6 C)   Resp 18   Ht 5' 3 (1.6 m)   Wt 109 lb (49.4 kg)   SpO2 100%   BMI 19.31 kg/m

## 2024-04-01 ENCOUNTER — Encounter: Payer: Self-pay | Admitting: Hematology & Oncology

## 2024-04-01 ENCOUNTER — Inpatient Hospital Stay: Admitting: Hematology & Oncology

## 2024-04-01 ENCOUNTER — Inpatient Hospital Stay

## 2024-04-01 ENCOUNTER — Inpatient Hospital Stay: Attending: Hematology & Oncology

## 2024-04-01 ENCOUNTER — Other Ambulatory Visit: Payer: Self-pay

## 2024-04-01 ENCOUNTER — Other Ambulatory Visit: Payer: Self-pay | Admitting: *Deleted

## 2024-04-01 VITALS — BP 130/61 | HR 86 | Temp 98.2°F | Resp 18 | Ht 63.0 in | Wt 107.0 lb

## 2024-04-01 DIAGNOSIS — Z923 Personal history of irradiation: Secondary | ICD-10-CM | POA: Diagnosis not present

## 2024-04-01 DIAGNOSIS — Z9221 Personal history of antineoplastic chemotherapy: Secondary | ICD-10-CM | POA: Insufficient documentation

## 2024-04-01 DIAGNOSIS — Z90710 Acquired absence of both cervix and uterus: Secondary | ICD-10-CM | POA: Diagnosis not present

## 2024-04-01 DIAGNOSIS — C561 Malignant neoplasm of right ovary: Secondary | ICD-10-CM | POA: Insufficient documentation

## 2024-04-01 DIAGNOSIS — Z90722 Acquired absence of ovaries, bilateral: Secondary | ICD-10-CM | POA: Insufficient documentation

## 2024-04-01 DIAGNOSIS — R180 Malignant ascites: Secondary | ICD-10-CM | POA: Diagnosis not present

## 2024-04-01 DIAGNOSIS — C786 Secondary malignant neoplasm of retroperitoneum and peritoneum: Secondary | ICD-10-CM | POA: Insufficient documentation

## 2024-04-01 LAB — CMP (CANCER CENTER ONLY)
ALT: 22 U/L (ref 0–44)
AST: 35 U/L (ref 15–41)
Albumin: 4.3 g/dL (ref 3.5–5.0)
Alkaline Phosphatase: 78 U/L (ref 38–126)
Anion gap: 16 — ABNORMAL HIGH (ref 5–15)
BUN: 29 mg/dL — ABNORMAL HIGH (ref 8–23)
CO2: 20 mmol/L — ABNORMAL LOW (ref 22–32)
Calcium: 9.6 mg/dL (ref 8.9–10.3)
Chloride: 102 mmol/L (ref 98–111)
Creatinine: 1.1 mg/dL — ABNORMAL HIGH (ref 0.44–1.00)
GFR, Estimated: 52 mL/min — ABNORMAL LOW
Glucose, Bld: 96 mg/dL (ref 70–99)
Potassium: 3.7 mmol/L (ref 3.5–5.1)
Sodium: 138 mmol/L (ref 135–145)
Total Bilirubin: 0.8 mg/dL (ref 0.0–1.2)
Total Protein: 6.7 g/dL (ref 6.5–8.1)

## 2024-04-01 LAB — CBC WITH DIFFERENTIAL (CANCER CENTER ONLY)
Abs Immature Granulocytes: 0.02 K/uL (ref 0.00–0.07)
Basophils Absolute: 0 K/uL (ref 0.0–0.1)
Basophils Relative: 0 %
Eosinophils Absolute: 0.1 K/uL (ref 0.0–0.5)
Eosinophils Relative: 2 %
HCT: 29.5 % — ABNORMAL LOW (ref 36.0–46.0)
Hemoglobin: 10.6 g/dL — ABNORMAL LOW (ref 12.0–15.0)
Immature Granulocytes: 1 %
Lymphocytes Relative: 8 %
Lymphs Abs: 0.3 K/uL — ABNORMAL LOW (ref 0.7–4.0)
MCH: 43.6 pg — ABNORMAL HIGH (ref 26.0–34.0)
MCHC: 35.9 g/dL (ref 30.0–36.0)
MCV: 121.4 fL — ABNORMAL HIGH (ref 80.0–100.0)
Monocytes Absolute: 0.5 K/uL (ref 0.1–1.0)
Monocytes Relative: 14 %
Neutro Abs: 2.6 K/uL (ref 1.7–7.7)
Neutrophils Relative %: 75 %
Platelet Count: 158 K/uL (ref 150–400)
RBC: 2.43 MIL/uL — ABNORMAL LOW (ref 3.87–5.11)
RDW: 14.3 % (ref 11.5–15.5)
WBC Count: 3.5 K/uL — ABNORMAL LOW (ref 4.0–10.5)
nRBC: 0 % (ref 0.0–0.2)

## 2024-04-01 LAB — MAGNESIUM: Magnesium: 1.6 mg/dL — ABNORMAL LOW (ref 1.7–2.4)

## 2024-04-01 MED ORDER — SODIUM CHLORIDE 0.9 % IV SOLN: INTRAVENOUS | Status: DC

## 2024-04-01 MED ORDER — MAGNESIUM SULFATE 2 GM/50ML IV SOLN
2.0000 g | Freq: Once | INTRAVENOUS | Status: AC
  Administered 2024-04-01: 2 g via INTRAVENOUS
  Filled 2024-04-01: qty 50

## 2024-04-01 NOTE — Patient Instructions (Signed)

## 2024-04-01 NOTE — Progress Notes (Signed)
 Given his minimal Hematology and Oncology Follow Up Visit  Maria Romero [id number] [birth year] 73 y.o. 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 Year --next dose 06/2024 SBRT --completed on 10/16/2023 EBRT -completed on 03/01/2024     Interim History:  Ms. Bambach is back for follow-up.  She is doing quite well.  She denies Thanksgiving.  She recently had her cataract surgery.  Everything went well with her cataract surgery..  She had external beam radiation therapy back in November.  She completed this on 03/01/2024.  This was to some nodules in the left abdominal wall.  She continues on olaparib .  I think she is doing fairly well on the olaparib .  Her last CA 125 was 30.  Hopefully this is come down little bit.  She has had no nausea or vomiting.  There has been no abdominal pain.  There has been no cough.  She has had no leg swelling.  She has had no bleeding.  Overall, her performance status is probably ECOG 1.       Wt Readings from Last 3 Encounters:  04/01/24 107 lb (48.5 kg)  03/28/24 109 lb (49.4 kg)  02/20/24 109 lb 12.8 oz (49.8 kg)    Medications:  Current Outpatient Medications:    amLODipine  (NORVASC ) 2.5 MG tablet, TAKE 1 TABLET BY MOUTH DAILY, Disp: 90 tablet, Rfl: 3   amoxicillin  (AMOXIL ) 500 MG tablet, Take 2,000 mg by mouth once. Take 1 hour prior to dental procedure. (Patient not taking: Reported on 02/20/2024), Disp: , Rfl:    aspirin 81 MG chewable tablet, Chew by mouth daily., Disp: , Rfl:    atorvastatin  (LIPITOR) 40 MG tablet, TAKE 1  TABLET BY MOUTH EVERY NIGHT AT BEDTIME, Disp: 90 tablet, Rfl: 3   Biotin 10000 MCG TABS, Take 10,000 mcg by mouth daily., Disp: , Rfl:    Calcium -Vitamin D -Vitamin K (VIACTIV PO), Take 1 tablet by mouth in the morning and at bedtime., Disp: , Rfl:    cholecalciferol (VITAMIN D3) 25 MCG (1000 UNIT) tablet, Take 1,000 Units by mouth daily., Disp: , Rfl:    conjugated estrogens  (PREMARIN ) vaginal cream, Place 1 Applicatorful vaginally 3 (three) times a week., Disp: 42.5 g, Rfl: 3   Cyanocobalamin  (VITAMIN B-12) 5000 MCG TBDP, Take 1 tablet by mouth daily at 6 (six) AM., Disp: , Rfl:    lidocaine -prilocaine  (EMLA ) cream, Apply a dime size of cream to the port-a-cath 1-2 hours prior to access. Cover with [employer]., Disp: 30 g, Rfl: 3   MAGNESIUM -OXIDE 400 (240 Mg) MG tablet, TAKE 1 TABLET BY MOUTH 2 TIMES A DAY, Disp: 60 tablet, Rfl: 5   meclizine  (ANTIVERT ) 25 MG tablet, Take 1 tablet (25 mg total) by mouth 3 (three) times daily as needed for dizziness., Disp: 30 tablet, Rfl: 0   meloxicam  (MOBIC ) 15 MG tablet, One tab PO every 24 hours with a meal for 2 weeks, then once every 24 hours prn pain. (Patient not taking: Reported on 02/20/2024), Disp: 30 tablet, Rfl: 3   olaparib  (LYNPARZA ) 150 MG tablet, Take 2 tablets (300 mg total)  by mouth 2 (two) times daily. Swallow whole. May take with food to decrease nausea and vomiting. Take as instructed per MD, Disp: 120 tablet, Rfl: 3   omeprazole  (PRILOSEC ) 20 MG capsule, TAKE 1 CAPSULE BY MOUTH DAILY, Disp: 90 capsule, Rfl: 2   ondansetron  (ZOFRAN ) 8 MG tablet, Take 1 tablet (8 mg total) by mouth every 8 (eight) hours as needed for nausea or vomiting., Disp: 20 tablet, Rfl: 0   senna (SENOKOT) 8.6 MG tablet, Take 1 tablet by mouth daily. (Patient taking differently: Take 1 tablet by mouth every other day.), Disp: , Rfl:    TYLENOL  500 MG tablet, Take 500-1,000 mg by mouth every 6 (six) hours as needed for mild pain or headache., Disp: , Rfl:   Current  Facility-Administered Medications:    zoledronic  acid (RECLAST ) injection 5 mg, 5 mg, Intravenous, Once,   Allergies:  Allergies  Allergen Reactions   Fluvastatin Sodium Other (See Comments)    Myalgias   Sudafed [Pseudoephedrine Hcl] Other (See Comments)    Makes her feel high    Past Medical History, Surgical history, Social history, and Family History were reviewed and updated.  Review of Systems: Review of Systems  Constitutional: Negative.   HENT:  Negative.    Eyes: Negative.   Respiratory: Negative.    Cardiovascular: Negative.   Gastrointestinal:  Negative for abdominal pain.  Genitourinary:  Negative for pelvic pain.   Musculoskeletal: Negative.   Skin: Negative.   Neurological: Negative.   Hematological: Negative.   Psychiatric/Behavioral: Negative.      Physical Exam:  height is 5' 3 (1.6 m) and weight is 107 lb (48.5 kg). Her oral temperature is 98.2 F (36.8 C). Her blood pressure is 130/61 and her pulse is 86. Her respiration is 18 and oxygen saturation is 100%.   Wt Readings from Last 3 Encounters:  04/01/24 107 lb (48.5 kg)  03/28/24 109 lb (49.4 kg)  02/20/24 109 lb 12.8 oz (49.8 kg)    Physical Exam Vitals reviewed.  HENT:     Head: Normocephalic and atraumatic.  Eyes:     Pupils: Pupils are equal, round, and reactive to light.  Cardiovascular:     Rate and Rhythm: Normal rate and regular rhythm.     Heart sounds: Normal heart sounds.  Pulmonary:     Effort: Pulmonary effort is normal.     Breath sounds: Normal breath sounds.  Chest:    Abdominal:     General: Bowel sounds are normal.     Palpations: Abdomen is soft.     Comments: Abdominal exam is soft.  She has good bowel sounds.  There is no fluid wave.  She has a well-healed laparotomy scar.  She has a scar where she had the colostomy.  There is no palpable abdominal mass.  There is no palpable liver or spleen tip.    Musculoskeletal:        General: No tenderness or deformity.  Normal range of motion.     Cervical back: Normal range of motion.  Lymphadenopathy:     Cervical: No cervical adenopathy.  Skin:    General: Skin is warm and dry.     Findings: No erythema or rash.  Neurological:     Mental Status: She is alert and oriented to person, place, and time.  Psychiatric:        Behavior: Behavior normal.        Thought Content: Thought content normal.  Judgment: Judgment normal.      Lab Results  Component Value Date   WBC 3.5 (L) 04/01/2024   HGB 10.6 (L) 04/01/2024   HCT 29.5 (L) 04/01/2024   MCV 121.4 (H) 04/01/2024   PLT 158 04/01/2024     Chemistry      Component Value Date/Time   NA 140 02/20/2024 0951   K 4.1 02/20/2024 0951   CL 104 02/20/2024 0951   CO2 24 02/20/2024 0951   BUN 18 02/20/2024 0951   CREATININE 0.99 02/20/2024 0951   CREATININE 0.93 11/03/2021 0000      Component Value Date/Time   CALCIUM  10.1 02/20/2024 0951   ALKPHOS 76 02/20/2024 0951   AST 32 02/20/2024 0951   ALT 24 02/20/2024 0951   BILITOT 0.8 02/20/2024 0951        Impression and Plan: Ms. Britz is a very charming 73 year old white female.  She had neoadjuvant chemotherapy.  She really had a tough time with neoadjuvant chemotherapy.  She subsequently underwent surgical resection.  She did still have active disease.    We then gave her some additional chemotherapy.  By her CA-125, she was then remission.  I think that her colostomy reversal certainly confirms that she was in remission.  I do think that the radiosurgery helped for the peritoneal metastasis.  Now, she completed radiotherapy for the abdominal wall nodules.  Hopefully, this will help.  Will do another CT scan on her.  Will get this set up sometime in January.  I know that she will have a wonderful Christmas and New Year's.  Challenging year for her.  As always, she has come through very nicely.    Maude JONELLE Crease, MD 12/22/202512:02 PM

## 2024-04-01 NOTE — Patient Instructions (Signed)
 Magnesium Sulfate Injection What is this medication? MAGNESIUM SULFATE (mag NEE zee um SUL fate) prevents and treats low levels of magnesium in your body. It may also be used to prevent and treat seizures during pregnancy in people with high blood pressure disorders, such as preeclampsia or eclampsia. Magnesium plays an important role in maintaining the health of your muscles and nervous system. This medicine may be used for other purposes; ask your health care provider or pharmacist if you have questions. What should I tell my care team before I take this medication? They need to know if you have any of these conditions: Heart disease History of irregular heart beat Kidney disease An unusual or allergic reaction to magnesium sulfate, medications, foods, dyes, or preservatives Pregnant or trying to get pregnant Breast-feeding How should I use this medication? This medication is for infusion into a vein. It is given in a hospital or clinic setting. Talk to your care team about the use of this medication in children. While this medication may be prescribed for selected conditions, precautions do apply. Overdosage: If you think you have taken too much of this medicine contact a poison control center or emergency room at once. NOTE: This medicine is only for you. Do not share this medicine with others. What if I miss a dose? This does not apply. What may interact with this medication? Certain medications for anxiety or sleep Certain medications for seizures, such phenobarbital Digoxin Medications that relax muscles for surgery Narcotic medications for pain This list may not describe all possible interactions. Give your health care provider a list of all the medicines, herbs, non-prescription drugs, or dietary supplements you use. Also tell them if you smoke, drink alcohol, or use illegal drugs. Some items may interact with your medicine. What should I watch for while using this  medication? Your condition will be monitored carefully while you are receiving this medication. You may need blood work done while you are receiving this medication. What side effects may I notice from receiving this medication? Side effects that you should report to your care team as soon as possible: Allergic reactions--skin rash, itching, hives, swelling of the face, lips, tongue, or throat High magnesium level--confusion, drowsiness, facial flushing, redness, sweating, muscle weakness, fast or irregular heartbeat, trouble breathing Low blood pressure--dizziness, feeling faint or lightheaded, blurry vision Side effects that usually do not require medical attention (report to your care team if they continue or are bothersome): Headache Nausea This list may not describe all possible side effects. Call your doctor for medical advice about side effects. You may report side effects to FDA at 1-800-FDA-1088. Where should I keep my medication? This medication is given in a hospital or clinic and will not be stored at home. NOTE: This sheet is a summary. It may not cover all possible information. If you have questions about this medicine, talk to your doctor, pharmacist, or health care provider.  2024 Elsevier/Gold Standard (2020-12-09 00:00:00)

## 2024-04-02 ENCOUNTER — Ambulatory Visit: Payer: Self-pay | Admitting: Hematology & Oncology

## 2024-04-02 LAB — CA 125: Cancer Antigen (CA) 125: 42.9 U/mL — ABNORMAL HIGH (ref 0.0–38.1)

## 2024-04-10 ENCOUNTER — Other Ambulatory Visit: Payer: Self-pay

## 2024-04-15 ENCOUNTER — Other Ambulatory Visit (HOSPITAL_COMMUNITY): Payer: Self-pay

## 2024-04-17 ENCOUNTER — Other Ambulatory Visit: Payer: Self-pay | Admitting: Pharmacy Technician

## 2024-04-17 ENCOUNTER — Other Ambulatory Visit: Payer: Self-pay

## 2024-04-17 NOTE — Progress Notes (Signed)
 Specialty Pharmacy Refill Coordination Note  Maria Romero is a 74 y.o. female contacted today regarding refills of specialty medication(s) Olaparib  (LYNPARZA )   Patient requested Delivery   Delivery date: 04/24/24   Verified address: [address] [place] [address]   Medication will be filled on: 04/23/24

## 2024-04-23 ENCOUNTER — Other Ambulatory Visit: Payer: Self-pay

## 2024-04-24 ENCOUNTER — Other Ambulatory Visit: Payer: Self-pay | Admitting: Hematology & Oncology

## 2024-04-25 ENCOUNTER — Encounter (HOSPITAL_BASED_OUTPATIENT_CLINIC_OR_DEPARTMENT_OTHER): Payer: Self-pay

## 2024-04-25 ENCOUNTER — Other Ambulatory Visit: Payer: Self-pay

## 2024-04-25 ENCOUNTER — Inpatient Hospital Stay: Attending: Hematology & Oncology

## 2024-04-25 ENCOUNTER — Ambulatory Visit (HOSPITAL_BASED_OUTPATIENT_CLINIC_OR_DEPARTMENT_OTHER)
Admission: RE | Admit: 2024-04-25 | Discharge: 2024-04-25 | Disposition: A | Discharge status: Home / Self Care | Source: Ambulatory Visit | Attending: Hematology & Oncology | Admitting: Hematology & Oncology

## 2024-04-25 DIAGNOSIS — C561 Malignant neoplasm of right ovary: Secondary | ICD-10-CM | POA: Insufficient documentation

## 2024-04-25 DIAGNOSIS — N281 Cyst of kidney, acquired: Secondary | ICD-10-CM | POA: Diagnosis not present

## 2024-04-25 DIAGNOSIS — K7689 Other specified diseases of liver: Secondary | ICD-10-CM | POA: Insufficient documentation

## 2024-04-25 DIAGNOSIS — R918 Other nonspecific abnormal finding of lung field: Secondary | ICD-10-CM | POA: Insufficient documentation

## 2024-04-25 DIAGNOSIS — C569 Malignant neoplasm of unspecified ovary: Secondary | ICD-10-CM | POA: Diagnosis present

## 2024-04-25 DIAGNOSIS — I700 Atherosclerosis of aorta: Secondary | ICD-10-CM | POA: Diagnosis not present

## 2024-04-25 DIAGNOSIS — R180 Malignant ascites: Secondary | ICD-10-CM | POA: Insufficient documentation

## 2024-04-25 MED ORDER — IOHEXOL 300 MG/ML  SOLN
75.0000 mL | Freq: Once | INTRAMUSCULAR | Status: AC | PRN
  Administered 2024-04-25: 75 mL via INTRAVENOUS

## 2024-04-25 MED ORDER — HEPARIN SOD (PORK) LOCK FLUSH 100 UNIT/ML IV SOLN
500.0000 [IU] | Freq: Once | INTRAVENOUS | Status: AC
  Administered 2024-04-25: 500 [IU] via INTRAVENOUS

## 2024-04-25 NOTE — Patient Instructions (Signed)

## 2024-04-25 NOTE — Progress Notes (Signed)
 Secure chat received from Suzen Buck (supervisor in Radiology). Pt's port was de- accessed by her.

## 2024-05-08 ENCOUNTER — Ambulatory Visit: Admitting: Family Medicine

## 2024-05-08 ENCOUNTER — Encounter: Payer: Self-pay | Admitting: Family Medicine

## 2024-05-08 VITALS — BP 116/64 | HR 101 | Ht 63.0 in | Wt 106.1 lb

## 2024-05-08 DIAGNOSIS — C561 Malignant neoplasm of right ovary: Secondary | ICD-10-CM

## 2024-05-08 DIAGNOSIS — K130 Diseases of lips: Secondary | ICD-10-CM

## 2024-05-08 DIAGNOSIS — B002 Herpesviral gingivostomatitis and pharyngotonsillitis: Secondary | ICD-10-CM | POA: Diagnosis not present

## 2024-05-08 DIAGNOSIS — R053 Chronic cough: Secondary | ICD-10-CM

## 2024-05-08 DIAGNOSIS — K219 Gastro-esophageal reflux disease without esophagitis: Secondary | ICD-10-CM | POA: Diagnosis not present

## 2024-05-08 DIAGNOSIS — I7300 Raynaud's syndrome without gangrene: Secondary | ICD-10-CM | POA: Diagnosis not present

## 2024-05-08 DIAGNOSIS — C7961 Secondary malignant neoplasm of right ovary: Secondary | ICD-10-CM

## 2024-05-08 MED ORDER — ACYCLOVIR 5 % EX OINT: 1.0000 | TOPICAL_OINTMENT | CUTANEOUS | 2 refills | Status: AC

## 2024-05-08 MED ORDER — VALACYCLOVIR HCL 1 G PO TABS: ORAL_TABLET | ORAL | 1 refills | Status: AC

## 2024-05-08 NOTE — Progress Notes (Signed)
 "  Established Patient Office Visit  Patient ID: Maria Romero, female    DOB: 1951/02/18  Age: 74 y.o. MRN: [id number] PCP: Alvan Dorothyann BIRCH, MD  Chief Complaint  Patient presents with   Medical Management of Chronic Issues   Mouth Lesions    Subjective:     HPI  Discussed the use of AI scribe software for clinical note transcription with the patient, who gave verbal consent to proceed.  History of Present Illness Maria Romero is a 74 year old female with metastatic cancer who presents with recurrent cold sores and a persistent cough.  Herpetic lesions - Cold sores developed two days ago, with lesions on lips and nose. - History of recurrent cold sores, with significant worsening after first round of chemotherapy two years ago. - Persistent crusting and dryness of upper lip since chemotherapy. - Outbreaks previously triggered by stress, illness, and sun exposure. - Incomplete healing despite use of various topical treatments.  Cough and fatigue - Persistent, non-productive cough began after Christmas. - No fever, sinus pain, or increased drainage. - Increased fatigue since onset of cough. - Recent CT scan showed dormant nodules in lungs and new growths in left lung and peritoneal area. - Completed ten rounds of radiation therapy before Thanksgiving.  Gastroesophageal reflux symptoms - Occasional heartburn and reflux attributed to recent dietary changes, including increased intake of garlic, tomatoes, and red wine. - Uses Prilosec  once daily for symptom management.  Raynaud's phenomenon - History of Raynaud's phenomenon. - Manages symptoms by wearing gloves and a mask when going out.  Sleep disturbance and fatigue - Difficulty sleeping, often due to racing thoughts. - Feels more fatigued as a result of poor sleep.  Nutritional status - Maintains weight and ensures adequate caloric intake. - Cautious with high-sugar foods due to concerns about blood sugar  levels.  Functional status - Lives alone and manages daily activities by pacing herself and resting as needed.     ROS    Objective:     BP 116/64   Pulse (!) 101   Ht 5' 3 (1.6 m)   Wt 106 lb 1.3 oz (48.1 kg)   SpO2 100%   BMI 18.79 kg/m    Physical Exam Vitals and nursing note reviewed.  Constitutional:      Appearance: Normal appearance.  HENT:     Head: Normocephalic and atraumatic.  Eyes:     Conjunctiva/sclera: Conjunctivae normal.  Cardiovascular:     Rate and Rhythm: Normal rate and regular rhythm.  Pulmonary:     Effort: Pulmonary effort is normal.     Breath sounds: Normal breath sounds.  Skin:    General: Skin is warm and dry.  Neurological:     Mental Status: She is alert.  Psychiatric:        Mood and Affect: Mood normal.      No results found for any visits on 05/08/24.    The 10-year ASCVD risk score (Arnett DK, et al., 2019) is: 17.4%    Assessment & Plan:   Problem List Items Addressed This Visit       Cardiovascular and Mediastinum   Raynaud disease   Raynaud's disease Well-managed, symptoms worsen in cold weather. - Continue wearing gloves in cold environments.         Digestive   GERD (gastroesophageal reflux disease)     Endocrine   [name], right (HCC) (Chronic)   Relevant Medications   valACYclovir  (VALTREX ) 1000 MG tablet  Other Visit Diagnoses       Sore lip    -  Primary     Oral herpes       Relevant Medications   valACYclovir  (VALTREX ) 1000 MG tablet   acyclovir  ointment (ZOVIRAX ) 5 %     Chronic cough           Assessment and Plan Assessment & Plan Herpes labialis Recurrent outbreak on upper lip and nose, more extensive and persistent, possibly stress and treatment-related. - Prescribed oral valacyclovir . - Prescribed topical acyclovir  cream. - Instructed to confirm valacyclovir  safety with oncologist during chemotherapy.  Metastatic ovarian cancer with pulmonary and peritoneal  metastases CT shows growth in lung and peritoneal nodules. Persistent dry cough possibly from nodules or GERD. Awaiting oncologist discussion. - Continue follow-up with oncologist on February 2nd. - Monitor cough, consider GERD management.  Chronic cough  Increased symptoms possibly from diet and alcohol, contributing to cough. - Increase Prilosec  to twice daily for 10 days, reassess after 10 days. - If improved, continue twice daily for 2-3 weeks, then reduce to once daily. - Avoid spicy foods and alcohol. - recent Chest CT showed enlargement of some nodules  - honeycombing notes in Left Upper lobe, likely post radiation  Raynaud's disease Well-managed, symptoms worsen in cold weather. - Continue wearing gloves in cold environments. - continue Norvasc .     No follow-ups on file.    Dorothyann Byars, MD [facility] at [facility]   "

## 2024-05-08 NOTE — Patient Instructions (Signed)
 Take the valacyclovir  2 tabs now and repeat in 12 hours for one day.   Rest of tabs are for future breakouts.

## 2024-05-08 NOTE — Assessment & Plan Note (Signed)
 Raynaud's disease Well-managed, symptoms worsen in cold weather. - Continue wearing gloves in cold environments.

## 2024-05-13 ENCOUNTER — Inpatient Hospital Stay

## 2024-05-13 ENCOUNTER — Inpatient Hospital Stay: Admitting: Hematology & Oncology

## 2024-05-16 ENCOUNTER — Other Ambulatory Visit (HOSPITAL_COMMUNITY): Payer: Self-pay

## 2024-05-17 ENCOUNTER — Other Ambulatory Visit: Payer: Self-pay

## 2024-05-23 ENCOUNTER — Inpatient Hospital Stay: Attending: Hematology & Oncology

## 2024-05-23 ENCOUNTER — Inpatient Hospital Stay: Admitting: Hematology & Oncology

## 2024-05-23 ENCOUNTER — Inpatient Hospital Stay

## 2024-06-07 ENCOUNTER — Inpatient Hospital Stay: Admitting: Gynecologic Oncology

## 2024-11-05 ENCOUNTER — Ambulatory Visit: Admitting: Family Medicine
